# Patient Record
Sex: Female | Born: 1955 | Race: Black or African American | Hispanic: No | Smoking: Current some day smoker
Health system: Southern US, Community
[De-identification: ages and names within clinical notes are randomized; demographics above are authoritative.]

## PROBLEM LIST (undated history)

## (undated) DIAGNOSIS — J45909 Unspecified asthma, uncomplicated: Secondary | ICD-10-CM

## (undated) DIAGNOSIS — R0602 Shortness of breath: Secondary | ICD-10-CM

## (undated) DIAGNOSIS — F419 Anxiety disorder, unspecified: Secondary | ICD-10-CM

## (undated) DIAGNOSIS — J449 Chronic obstructive pulmonary disease, unspecified: Secondary | ICD-10-CM

## (undated) DIAGNOSIS — M1990 Unspecified osteoarthritis, unspecified site: Secondary | ICD-10-CM

## (undated) DIAGNOSIS — G8929 Other chronic pain: Secondary | ICD-10-CM

## (undated) DIAGNOSIS — M549 Dorsalgia, unspecified: Secondary | ICD-10-CM

## (undated) DIAGNOSIS — R079 Chest pain, unspecified: Secondary | ICD-10-CM

## (undated) DIAGNOSIS — I10 Essential (primary) hypertension: Secondary | ICD-10-CM

## (undated) DIAGNOSIS — M5430 Sciatica, unspecified side: Secondary | ICD-10-CM

## (undated) HISTORY — PX: TONSILLECTOMY: SUR1361

## (undated) HISTORY — PX: OTHER SURGICAL HISTORY: SHX169

---

## 1995-05-29 HISTORY — PX: ABDOMINAL HYSTERECTOMY: SHX81

## 2004-04-03 ENCOUNTER — Emergency Department (HOSPITAL_COMMUNITY): Admission: EM | Admit: 2004-04-03 | Discharge: 2004-04-03 | Payer: Self-pay | Admitting: Plastic Surgery

## 2004-05-28 DIAGNOSIS — R079 Chest pain, unspecified: Secondary | ICD-10-CM

## 2004-05-28 HISTORY — DX: Chest pain, unspecified: R07.9

## 2004-05-28 HISTORY — PX: CARDIAC CATHETERIZATION: SHX172

## 2004-08-02 ENCOUNTER — Inpatient Hospital Stay (HOSPITAL_COMMUNITY): Admission: EM | Admit: 2004-08-02 | Discharge: 2004-08-04 | Payer: Self-pay | Admitting: Emergency Medicine

## 2004-08-05 ENCOUNTER — Emergency Department (HOSPITAL_COMMUNITY): Admission: EM | Admit: 2004-08-05 | Discharge: 2004-08-05 | Payer: Self-pay | Admitting: Emergency Medicine

## 2004-11-04 ENCOUNTER — Encounter: Admission: RE | Admit: 2004-11-04 | Discharge: 2004-11-04 | Payer: Self-pay | Admitting: Family Medicine

## 2004-11-28 ENCOUNTER — Ambulatory Visit: Payer: Self-pay | Admitting: Psychiatry

## 2004-11-28 ENCOUNTER — Inpatient Hospital Stay (HOSPITAL_COMMUNITY): Admission: RE | Admit: 2004-11-28 | Discharge: 2004-12-01 | Payer: Self-pay | Admitting: Psychiatry

## 2004-11-28 ENCOUNTER — Emergency Department (HOSPITAL_COMMUNITY): Admission: EM | Admit: 2004-11-28 | Discharge: 2004-11-28 | Payer: Self-pay | Admitting: Emergency Medicine

## 2004-12-06 ENCOUNTER — Other Ambulatory Visit (HOSPITAL_COMMUNITY): Admission: RE | Admit: 2004-12-06 | Discharge: 2004-12-12 | Payer: Self-pay | Admitting: Psychiatry

## 2005-01-31 ENCOUNTER — Emergency Department (HOSPITAL_COMMUNITY): Admission: EM | Admit: 2005-01-31 | Discharge: 2005-01-31 | Payer: Self-pay | Admitting: Emergency Medicine

## 2005-03-04 ENCOUNTER — Emergency Department (HOSPITAL_COMMUNITY): Admission: EM | Admit: 2005-03-04 | Discharge: 2005-03-05 | Payer: Self-pay | Admitting: Emergency Medicine

## 2005-05-07 ENCOUNTER — Ambulatory Visit (HOSPITAL_COMMUNITY): Admission: RE | Admit: 2005-05-07 | Discharge: 2005-05-07 | Payer: Self-pay | Admitting: Family Medicine

## 2005-06-11 ENCOUNTER — Emergency Department (HOSPITAL_COMMUNITY): Admission: EM | Admit: 2005-06-11 | Discharge: 2005-06-12 | Payer: Self-pay | Admitting: Emergency Medicine

## 2005-06-12 ENCOUNTER — Ambulatory Visit: Payer: Self-pay | Admitting: Psychiatry

## 2005-06-12 ENCOUNTER — Inpatient Hospital Stay (HOSPITAL_COMMUNITY): Admission: EM | Admit: 2005-06-12 | Discharge: 2005-06-17 | Payer: Self-pay | Admitting: Psychiatry

## 2005-07-19 ENCOUNTER — Emergency Department (HOSPITAL_COMMUNITY): Admission: EM | Admit: 2005-07-19 | Discharge: 2005-07-19 | Payer: Self-pay | Admitting: Emergency Medicine

## 2006-01-14 ENCOUNTER — Emergency Department (HOSPITAL_COMMUNITY): Admission: EM | Admit: 2006-01-14 | Discharge: 2006-01-14 | Payer: Self-pay | Admitting: Emergency Medicine

## 2006-06-04 ENCOUNTER — Emergency Department (HOSPITAL_COMMUNITY): Admission: EM | Admit: 2006-06-04 | Discharge: 2006-06-04 | Payer: Self-pay | Admitting: Emergency Medicine

## 2006-06-13 ENCOUNTER — Emergency Department (HOSPITAL_COMMUNITY): Admission: EM | Admit: 2006-06-13 | Discharge: 2006-06-13 | Payer: Self-pay | Admitting: Emergency Medicine

## 2006-07-23 ENCOUNTER — Emergency Department (HOSPITAL_COMMUNITY): Admission: EM | Admit: 2006-07-23 | Discharge: 2006-07-23 | Payer: Self-pay | Admitting: Emergency Medicine

## 2006-10-12 ENCOUNTER — Emergency Department (HOSPITAL_COMMUNITY): Admission: EM | Admit: 2006-10-12 | Discharge: 2006-10-12 | Payer: Self-pay | Admitting: Emergency Medicine

## 2006-12-17 ENCOUNTER — Emergency Department (HOSPITAL_COMMUNITY): Admission: EM | Admit: 2006-12-17 | Discharge: 2006-12-17 | Payer: Self-pay | Admitting: Emergency Medicine

## 2007-01-26 ENCOUNTER — Emergency Department (HOSPITAL_COMMUNITY): Admission: EM | Admit: 2007-01-26 | Discharge: 2007-01-26 | Payer: Self-pay | Admitting: Emergency Medicine

## 2007-02-10 ENCOUNTER — Emergency Department (HOSPITAL_COMMUNITY): Admission: EM | Admit: 2007-02-10 | Discharge: 2007-02-10 | Payer: Self-pay | Admitting: Emergency Medicine

## 2007-04-16 ENCOUNTER — Inpatient Hospital Stay (HOSPITAL_COMMUNITY): Admission: AD | Admit: 2007-04-16 | Discharge: 2007-04-18 | Payer: Self-pay | Admitting: *Deleted

## 2007-04-16 ENCOUNTER — Ambulatory Visit: Payer: Self-pay | Admitting: *Deleted

## 2007-04-16 ENCOUNTER — Emergency Department (HOSPITAL_COMMUNITY): Admission: EM | Admit: 2007-04-16 | Discharge: 2007-04-16 | Payer: Self-pay | Admitting: Emergency Medicine

## 2007-05-13 ENCOUNTER — Emergency Department (HOSPITAL_COMMUNITY): Admission: EM | Admit: 2007-05-13 | Discharge: 2007-05-13 | Payer: Self-pay | Admitting: Emergency Medicine

## 2007-07-23 ENCOUNTER — Emergency Department (HOSPITAL_COMMUNITY): Admission: EM | Admit: 2007-07-23 | Discharge: 2007-07-23 | Payer: Self-pay | Admitting: Emergency Medicine

## 2007-11-01 ENCOUNTER — Ambulatory Visit (HOSPITAL_COMMUNITY): Admission: RE | Admit: 2007-11-01 | Discharge: 2007-11-01 | Payer: Self-pay | Admitting: Anesthesiology

## 2008-04-11 ENCOUNTER — Emergency Department (HOSPITAL_COMMUNITY): Admission: EM | Admit: 2008-04-11 | Discharge: 2008-04-11 | Payer: Self-pay | Admitting: Emergency Medicine

## 2008-04-12 ENCOUNTER — Emergency Department (HOSPITAL_COMMUNITY): Admission: EM | Admit: 2008-04-12 | Discharge: 2008-04-12 | Payer: Self-pay | Admitting: Emergency Medicine

## 2008-06-16 ENCOUNTER — Emergency Department (HOSPITAL_COMMUNITY): Admission: EM | Admit: 2008-06-16 | Discharge: 2008-06-16 | Payer: Self-pay | Admitting: Emergency Medicine

## 2008-11-23 ENCOUNTER — Emergency Department (HOSPITAL_COMMUNITY): Admission: EM | Admit: 2008-11-23 | Discharge: 2008-11-23 | Payer: Self-pay | Admitting: Emergency Medicine

## 2009-06-28 ENCOUNTER — Emergency Department (HOSPITAL_COMMUNITY): Admission: EM | Admit: 2009-06-28 | Discharge: 2009-06-28 | Payer: Self-pay | Admitting: Emergency Medicine

## 2009-12-11 ENCOUNTER — Emergency Department (HOSPITAL_COMMUNITY): Admission: EM | Admit: 2009-12-11 | Discharge: 2009-12-12 | Payer: Self-pay | Admitting: Emergency Medicine

## 2009-12-12 ENCOUNTER — Ambulatory Visit: Payer: Self-pay | Admitting: Psychiatry

## 2009-12-12 ENCOUNTER — Inpatient Hospital Stay (HOSPITAL_COMMUNITY): Admission: AD | Admit: 2009-12-12 | Discharge: 2009-12-14 | Payer: Self-pay | Admitting: Psychiatry

## 2009-12-13 ENCOUNTER — Emergency Department (HOSPITAL_COMMUNITY): Admission: EM | Admit: 2009-12-13 | Discharge: 2009-12-13 | Payer: Self-pay | Admitting: Emergency Medicine

## 2009-12-14 DIAGNOSIS — F309 Manic episode, unspecified: Secondary | ICD-10-CM

## 2010-01-23 ENCOUNTER — Emergency Department (HOSPITAL_COMMUNITY): Admission: EM | Admit: 2010-01-23 | Discharge: 2010-01-23 | Payer: Self-pay | Admitting: Emergency Medicine

## 2010-02-18 ENCOUNTER — Emergency Department (HOSPITAL_COMMUNITY): Admission: EM | Admit: 2010-02-18 | Discharge: 2010-02-18 | Payer: Self-pay | Admitting: Emergency Medicine

## 2010-04-04 ENCOUNTER — Ambulatory Visit: Payer: Self-pay | Admitting: Psychiatry

## 2010-05-09 ENCOUNTER — Emergency Department (HOSPITAL_COMMUNITY)
Admission: EM | Admit: 2010-05-09 | Discharge: 2010-05-09 | Payer: Self-pay | Source: Home / Self Care | Admitting: Emergency Medicine

## 2010-05-26 ENCOUNTER — Emergency Department (HOSPITAL_COMMUNITY)
Admission: EM | Admit: 2010-05-26 | Discharge: 2010-05-26 | Payer: Self-pay | Source: Home / Self Care | Admitting: Emergency Medicine

## 2010-06-18 ENCOUNTER — Encounter: Payer: Self-pay | Admitting: Anesthesiology

## 2010-06-18 ENCOUNTER — Encounter: Payer: Self-pay | Admitting: Family Medicine

## 2010-08-12 LAB — RAPID URINE DRUG SCREEN, HOSP PERFORMED
Benzodiazepines: NOT DETECTED
Cocaine: NOT DETECTED
Tetrahydrocannabinol: NOT DETECTED

## 2010-08-12 LAB — URINALYSIS, ROUTINE W REFLEX MICROSCOPIC
Bilirubin Urine: NEGATIVE
Glucose, UA: NEGATIVE mg/dL
Hgb urine dipstick: NEGATIVE
Ketones, ur: NEGATIVE mg/dL
Nitrite: NEGATIVE
Protein, ur: NEGATIVE mg/dL
Specific Gravity, Urine: 1.019 (ref 1.005–1.030)
Urobilinogen, UA: 0.2 mg/dL (ref 0.0–1.0)
pH: 5.5 (ref 5.0–8.0)

## 2010-08-12 LAB — CBC
MCHC: 33.7 g/dL (ref 30.0–36.0)
RDW: 13.3 % (ref 11.5–15.5)

## 2010-08-12 LAB — LIPID PANEL
Cholesterol: 189 mg/dL (ref 0–200)
HDL: 51 mg/dL (ref >39)

## 2010-08-12 LAB — DIFFERENTIAL
Basophils Absolute: 0.1 10*3/uL (ref 0.0–0.1)
Basophils Relative: 1 % (ref 0–1)
Eosinophils Absolute: 0 10*3/uL (ref 0.0–0.7)
Monocytes Absolute: 0.2 10*3/uL (ref 0.1–1.0)
Neutro Abs: 3.4 10*3/uL (ref 1.7–7.7)
Neutrophils Relative %: 55 % (ref 43–77)

## 2010-08-12 LAB — POCT I-STAT, CHEM 8
Calcium, Ion: 1.19 mmol/L (ref 1.12–1.32)
Chloride: 100 mEq/L (ref 96–112)
HCT: 46 % (ref 36.0–46.0)
Hemoglobin: 15.6 g/dL — ABNORMAL HIGH (ref 12.0–15.0)
Potassium: 3.2 mEq/L — ABNORMAL LOW (ref 3.5–5.1)

## 2010-08-12 LAB — ETHANOL: Alcohol, Ethyl (B): 8 mg/dL (ref 0–10)

## 2010-09-11 LAB — POCT I-STAT, CHEM 8
BUN: 6 mg/dL (ref 6–23)
Chloride: 109 mEq/L (ref 96–112)
Creatinine, Ser: 0.7 mg/dL (ref 0.4–1.2)
Potassium: 3.2 mEq/L — ABNORMAL LOW (ref 3.5–5.1)
Sodium: 143 mEq/L (ref 135–145)
TCO2: 25 mmol/L (ref 0–100)

## 2010-10-13 NOTE — H&P (Signed)
NAMECHRISMA, HURLOCK NO.:  [id number]   MEDICAL RECORD NO.:  [id number]          PATIENT TYPE:  IPS   LOCATION:  0406                          FACILITY:  [facility]   PHYSICIAN:  Geoffery Lyons, M.D.      DATE OF BIRTH:  [birth year]   DATE OF ADMISSION:  06/12/2005  DATE OF DISCHARGE:                         PSYCHIATRIC ADMISSION ASSESSMENT   A 55 year old divorced African-American female voluntarily admitted on  June 12, 2005.   HISTORY OF PRESENT ILLNESS:  Patient presents with a history of depression  and suicidal thoughts with voices telling her to stab herself.  Patient  feels very worthless and feels like she is not here.  She is reporting  disturbed sleep, lost about 10 pounds.  Stressors, patient states that she  has no contact with her family.   PAST PSYCHIATRIC HISTORY:  Second admission.  Patient was here in July 2006.  Patient had an overdose of Percocet and sleeping pills.  She sees Dr.  Milford Cage.  Patient had her first visit with her.  She has a history of  multiple overdoses, otherwise.   SOCIAL HISTORY:  This is a 55 year old divorced African-American female who  has three grown children with 11 grandchildren.  She recently moved to  [place] about 1 year ago, currently living alone.  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   FAMILY HISTORY:  Denied.   ALCOHOL AND DRUG HISTORY:  Patient smokes, denies any alcohol or drug use.   PRIMARY CARE Jyasia Markoff:  Dr. Gerri Spore.   MEDICAL PROBLEMS:  Hypertension.  Back pain.   MEDICATIONS:  1.  Enalapril 10/25 daily.  2.  Celebrex 200 mg daily.  3.  Ambien CR 12.5 at bedtime.   DRUG ALLERGIES:  No known allergies.   PHYSICAL EXAMINATION:  Patient was assessed at [facility] Emergency Room.  This is a middle-aged cooperative female in no acute distress.  Temperature  97.5, heart rate 84, respirations 18, blood pressure 125/73, 5 feet 1 inch  tall, 166 pounds.   LABORATORY DATA:  CBC  is within normal limits.  Glucose 104.  Calcium 9.4.  Urine drug screen was negative.  Alcohol level less than 5.   MENTAL STATUS EXAM:  She is a fully alert, cooperative, middle-aged female  with fair eye contact.  Speech is clear with normal rate and tone.  Patient  feels depressed.  Affect is flat.  Thought processes are coherent and  organized.  Endorsing auditory hallucinations.  Does not appear to be  responding to internal stimuli at this time.  Cognitive function intact.  Memory is good.  Judgment and insight are fair.  Poor historian.   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  AXIS II:  Deferred.  AXIS III:  Hypertension.  Back pain.  AXIS IV:  Problems with primary support group, medical problems.  AXIS V:  Current estimated 35.   PLAN:  Stabilize mood and thinking.  Contract for safety.  We will resume  her medications.  Patient will increase coping skills.  We  will consider  family session with patient's support group.  Tentative length of stay 4-5  days.      Landry Corporal, N.P.      Geoffery Lyons, M.D.  Electronically Signed    JO/MEDQ  D:  06/15/2005  T:  06/15/2005  Job:  [id number]

## 2010-10-13 NOTE — Discharge Summary (Signed)
NAMEMIRABELLA, HILARIO                ACCOUNT NO.:  [id number]   MEDICAL RECORD NO.:  [id number]          PATIENT TYPE:  IPS   LOCATION:  0604                          FACILITY:  [facility]   PHYSICIAN:  Geoffery Lyons, M.D.      DATE OF BIRTH:  1956-02-24   DATE OF ADMISSION:  04/16/2007  DATE OF DISCHARGE:  04/18/2007                               DISCHARGE SUMMARY   CHIEF COMPLAINT/HISTORY OF PRESENT ILLNESS:  This was the first  admission to [facility] for this 55 year old female,  feeling depressed, crying, suicidal thoughts, wanting to end it all.  She has not slept in awhile; feeling pretty overwhelmed.  She was  involuntarily committed as she had expressed suicidal thoughts,  threatening to leave before completion of the ED treatment where she was  brought for help.   PSYCHIATRIC HISTORY:  No current outpatient treatment; did see Dr.  Milford Cage  in the past.   ALCOHOL AND DRUG HISTORY:  Denies acute use of use of any substances.   MEDICAL HISTORY:  The hypertension.   MEDICATIONS:  Vasotec 10/25 1 daily, Percocet 1 3 times a day as needed,  Ambien 10 at bedtime for sleep.   PHYSICAL EXAMINATION:  Exam performed and failed to show any acute  findings.   LABORATORY WORK:  Results not available in the chart.   MENTAL STATUS EXAMINATION:  Exam reveals alert, cooperative female, mood  depressed.  Affect depressed.  Thought process logical, coherent and  relevant.  Endorsed feeling very overwhelmed with the way things are  going.  A sense of helpless, helplessness, feeling worthless, suicidal  ruminations although can contract for safety.  No homicidal ideas, no  hallucinations.  Cognition well-preserved.   ADMITTING DIAGNOSES:  AXIS I:  Bipolar disorder depressed.  AXIS II:  No diagnosis.  AXIS III:  History hypertension, back pain.  AXIS IV:  Moderate.  AXIS V:  On admission 35 global assessment of functioning.  Highest  global assessment of functioning in the  last year 60.   COURSE IN THE HOSPITAL:  She was admitted.  She started in individual  and group psychotherapy.  She was given Ambien for sleep.  She was given  Ativan as needed and she was also given some Zyprexa.  As already stated  endorse increased depression, was under a lot of stress taking care of  family members, felt like killing herself; a lot of thoughts running in  her head, feeling worthless, nobody would care or was giving.  Never  says no.  Conflict with the daughter; did not speak to her family in  [place].  She was asked by her sister to leave.  She was kicked  out, was in the streets of  [place] and then in the streets of  [place].  Living by herself, endorsed conflict with the daughter who  claims hates here.  She used to see Dr. Milford Cage in 2006; did not  pursue treatment.  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  She claimed to be uncomfortable in the  unit, felt that there  was some prejudice going on.  They did not like her because of her race.  Did not want to go to group.  By the 23rd she was better.  She was  taking her medications.  She endorsed that she was feeling better.  Endorsed no active suicidal ideas, no hallucinations or delusions, that  she was stable and she denied any active suicidal and homicidal thoughts  and overall seems to be improved.  We went ahead and discharged to  outpatient follow-up.   DISCHARGE DIAGNOSES:  AXIS I:  Bipolar disorder depressed.  AXIS II:  No diagnosis.  AXIS III:  She has hypertension, back pain.  AXIS IV:  Moderate.  AXIS V:  On discharge global assessment of functioning 50.   DISCHARGE MEDICATIONS:  Discharged on Vaseretic 10/25 1 daily, Zyprexa  Zydis 5 at bedtime, Ambien 10 at bedtime as needed for sleep, Ativan 1  mg every 8 hours as needed for anxiety, Percocet 5/335 1 3 times as  needed for pain.   FOLLOW UP:  Follow-up by [facility].      Geoffery Lyons, M.D.   Electronically Signed     IL/MEDQ  D:  05/23/2007  T:  05/24/2007  Job:  [id number]

## 2010-10-13 NOTE — Discharge Summary (Signed)
[name], [name]                ACCOUNT NO.:  [id number]   MEDICAL RECORD NO.:  [id number]          PATIENT TYPE:  IPS   LOCATION:  0406                          FACILITY:  [facility]   PHYSICIAN:  Geoffery Lyons, M.D.      DATE OF BIRTH:  1956-05-16   DATE OF ADMISSION:  06/12/2005  DATE OF DISCHARGE:  06/17/2005                                 DISCHARGE SUMMARY   CHIEF COMPLAINT AND PRESENT ILLNESS:  This was the second admission to [facility] for this 55 year old divorced African-American female  voluntarily admitted.  History of depression, suicidal thoughts with voices  telling her to stab herself.  Feeling very worthless.  Feels like she is  not here.  Reporting disturbed sleep, lost about 10 pounds.  Stressors are,  the patient states, she has no contact with her family.   PAST PSYCHIATRIC HISTORY:  Second time at [facility].  She was  admitted July of 2006.  Had an overdose of Percocet and sleeping pills.  Sees Dr. Milford Cage.  Has history of multiple overdoses.   ALCOHOL/DRUG HISTORY:  Smokes.  Denies any active alcohol or drug use.   MEDICAL HISTORY:  Hypertension, back pain.   MEDICATIONS:  Enalapril 10/25 mg per day, Celebrex 200 mg per day, Ambien CR  12.5 mg per day.   PHYSICAL EXAMINATION:  Performed and failed to show any acute findings.   LABORATORY DATA:  Liver enzymes with SGOT 19, SGPT 15, total bilirubin 0.2,  TSH 1.104.  CBC within normal limits.  Glucose 104.  Urine drug screen  negative for substances of abuse.   MENTAL STATUS EXAM:  Fully alert, cooperative female.  Fair eye contact.  Speech was clear, normal rate and tone.  Feeling depressed.  Affect was  constricted.  Thought processes were logical, coherent and relevant.  Endorsed auditory hallucinations.  Does not appear to be responding to  internal stimuli.  There is some underlying suspiciousness.  Possible  paranoia.  Endorsed suicidal ideation although could contract for  safety.  Endorsed hallucinations.  Cognition was well-preserved.   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  AXIS II:  No diagnosis.  AXIS III:  Hypertension, back pain.  AXIS IV:  Moderate.  AXIS V:  GAF upon admission 35; highest GAF in the last year 60.   HOSPITAL COURSE:  She was admitted.  She was started in individual and group  psychotherapy.  She was given Zyprexa Zydis 5 mg every six hours as needed.  Maintained on the Ambien CR 12.5 mg at night, Percocet 5/500 mg, 1 every six  hours as needed for pain, Ativan 1 mg every six hours as needed for anxiety  and Celebrex 100 mg twice a day.  The enalapril was placed at 10/125 mg per  day.  We continued to work with the Zyprexa and we adjusted the Zyprexa  according to tolerability and response.  There was a sense of hopelessness  and helplessness.  There were suicidal ruminations.  She claimed that she  was  in [place].  She was enticed by her children to come all the way  here to [place] to [place] but, after she came, they have  basically neglected to have a relationship with her.  Endorsed that there is  a lot of conflict between her and her son and her and her daughter.  Said  that she lent some money to her son and, when she asked for the money back,  he was very upset and basically cut off the relationship.  Has been feeling  pretty alone during the holidays.  She said that she was by herself during  Thanksgiving as well as Christmas.  This accentuated her sense of being very  sad, alone, overwhelmed.  Endorsed hallucinations, voices telling her to  hurt herself.  On evaluation, she was shaky.  She was tearful.  We continued  to work with the medication.  There was a lot of rumination, dealing with  the losses, sense of being alone, auditory hallucinations.  As we increased  the Zyprexa, she started sleeping better.  She was very confused as far as  what to  do, if she was going to stay in the area or she was going to go back  to [place].  Needing a lot of reassurance and support.  By June 16, 2005, there was some improvement.  There was some swelling of the ankles and  it was attributed to the Zyprexa, so we stopped the Zyprexa.  We went ahead  and started the Geodon that she seemed to tolerate well.  On June 17, 2005, she endorsed she was better.  She slept all night with the Geodon.  Denied any hallucinations.  She felt like she was stable enough to be able  to be safe, working on her issues on an outpatient basis for what we went  ahead and discharged to outpatient follow-up.  Still a possibility that she  was going to call the family and decide to go back to [place].   DISCHARGE DIAGNOSES:  AXIS I:  Bipolar disorder, depressed, with psychotic  features.  AXIS II:  No diagnosis.  AXIS III:  Hypertension, back pain.  AXIS IV:  Moderate.  AXIS V:  GAF upon discharge 50-55.   DISCHARGE MEDICATIONS:  1.  Ambien CR 12.5 mg at night.  2.  Vasotec 10 mg per day.  3.  Hydrochlorothiazide 25 mg per day.  4.  Geodon 40 mg twice a day with plans to increase up to 80 mg twice a day      as tolerated.  5.  Oxycodone 5/325 mg, 1 every six hours as needed for pain.  6.  Ativan 1 mg twice a day as needed for anxiety.   FOLLOW UP:  Dr. Milford Cage.      Geoffery Lyons, M.D.  Electronically Signed     IL/MEDQ  D:  06/26/2005  T:  06/26/2005  Job:  [id number]

## 2010-10-13 NOTE — Cardiovascular Report (Signed)
[name]                ACCOUNT NO.:  [id number]   MEDICAL RECORD NO.:  [id number]          PATIENT TYPE:  INP   LOCATION:  4731                         FACILITY:  [facility]   PHYSICIAN:  Francisca December, M.D.  DATE OF BIRTH:  Aug 24, 1955   DATE OF PROCEDURE:  08/04/2004  DATE OF DISCHARGE:                              CARDIAC CATHETERIZATION   PROCEDURES PERFORMED:  1.  Left heart catheterization.  2.  Left ventriculogram.  3.  Distal aortogram.  4.  Coronary angiography.   INDICATIONS:  [name] is a 55 year old woman who was admitted with  atypical angina and ruled out for myocardial infarction.  A myocardial  perfusion study with exercise-reproduced nondiagnostic ST segment changes  and a reversible anterior defect was seen on perfusion imaging.  She is  brought to the catheterization laboratory at this time to identify the  extent of disease and provide for further therapeutic options.   PROCEDURAL NOTE:  The patient was brought to the cardiac catheterization  laboratory in the fasting state.  The right groin was prepped and draped in  the usual sterile fashion.  Local anesthesia was obtained with the  infiltration of 1% lidocaine.  A 6 French catheter sheath was inserted  percutaneously into the right femoral artery utilizing an anterior approach  over a guiding J-wire.  A 110 cm pigtail catheter was used to measure  pressures in the ascending aorta and in the left ventricle both prior to and  following the ventriculogram.  A 30-degree RAO cine left ventriculogram was  performed utilizing a power injector.  The pigtail catheter was then  withdrawn into the abdominal aorta, and an AP cine angiogram of the distal  aorta obtained, again utilizing a power injector.  32 cc of contrast was  injected at 15 cc/second.  The pigtail catheter was then exchanged for a 6  French #4 left Judkins catheter.  Cine angiography of the left coronary  artery was conducted in multiple  LAO and RAO projections.  The #4 left  Judkins catheter was then exchanged for a #4 right Judkins catheter.  Cine  angiography of the right coronary artery was conducted in LAO and RAO  projections.  All catheter manipulations were performed using fluoroscopic  observation, and exchange was performed over a long guiding J-wire.  At the  completion of the procedure, the catheter and catheter sheath were removed.  Hemostasis was achieved by direct pressure.  The patient was transported to  the recovery area in stable condition, with an intact distal pulse.   HEMODYNAMICS:  Systemic arterial pressure was 168/85, with a mean of 114  mmHg.  There was no systolic gradient across the aortic valve.  The left  ventricular end-diastolic pressure was 21 mmHg preventriculogram and 24 mmHg  postventriculogram.   ANGIOGRAPHY:  The left ventriculogram was suboptimal, with excessive  ventricular tachycardia induced by the injection.  Overall global systolic  function, though, is normal.  The final post PVC beat shows no regional wall  motion abnormalities.  The aortic valve was trileaflet and opens normally  during systole.  There  is no mitral regurgitation.   The distal aortogram demonstrates widely patent renal arteries bilaterally  and no significant atherosclerotic disease.   There is a right dominant coronary system present.  The main left coronary  artery was normal.   The left anterior descending and its branches are normal.  Three diagonal  branches arise, the first of which is moderate in size, the second and third  of which are relatively small.  The ongoing anterior descending reaches and  traverses the apex.  I do not see any evidence of atherosclerotic disease in  the anterior descending artery.   The left circumflex coronary artery and its branches appear normal.  There  is a large first marginal branch and then a small second marginal branch.  The third marginal branch is large and  is on the obtuse margin of the heart.  The ongoing circumflex is very tiny in the AV groove.  Again, no  obstructions or luminal irregularities are seen in the left circumflex  system.   The right coronary artery and its branches are normal.  The vessel gives  rise to a very small posterior descending artery, a small posterolateral  segment, and two small left ventricular branches.  Proximal, mid, and distal  portions of the artery are without any evidence of atherosclerotic disease.  There is a large right ventricular branch that moves inferiorly and apically  providing distal septal perforators.   Collateral vessels are not seen.   FINAL IMPRESSION:  1.  Intact left ventricular size and global systolic function.  Ejection      fraction estimated at 65%.  2.  Elevated left ventricular end-diastolic pressure.  3.  Systemic hypertension.  4.  No evidence of renal artery stenosis.  5.  Noncardiac chest pain/normal coronary arteries.   PLAN/RECOMMENDATIONS:  This excellent news was presented to the patient.  She needs aggressive hypertensive treatment, of course.  Other evaluation to  be undertaken by the hospitalist service as they feel appropriate.      JHE/MEDQ  D:  08/04/2004  T:  08/04/2004  Job:  [id number]   cc:   Otilio Connors. Gerri Spore, M.D.  [address]  [place]  [place] [address]  Fax: [phone]

## 2010-10-13 NOTE — Discharge Summary (Signed)
NAME:  [name], [name]                ACCOUNT NO.:  [id number]   MEDICAL RECORD NO.:  [id number]          PATIENT TYPE:  INP   LOCATION:  4731                         FACILITY:  [facility]   PHYSICIAN:  Corinna L. Lendell Caprice, MDDATE OF BIRTH:  [birth year]   DATE OF ADMISSION:  08/02/2004  DATE OF DISCHARGE:  08/04/2004                                 DISCHARGE SUMMARY   DIAGNOSES:  1.  Symptom complex of headache, chest pain, weakness, diarrhea and vomiting      most likely viral in origin, negative catheterization.  2.  False positive Cardiolite.  3.  Hypertension.  4.  Tobacco abuse, counseled against.  5.  Hyperlipidemia: Will need follow-up as an outpatient  6.  Chronic back pain.   DISCHARGE MEDICATIONS:  1.  Aspirin 81 mg a day.  2.  Vaseretic as previous.  3.  Fioricet 1-2 p.o. q.4h. p.r.n. headache 10 were dispensed.   CONDITION ON DISCHARGE:  Condition stable.   FOLLOW UP:  With Dr. Queen Arnold an as needed and certainly within three to six  months.  Follow-up her fasting lipid panel.   CONSULTATIONS:  Meade Maw, M.D.   PROCEDURE:  Cardiac catheterization which showed normal coronaries with no  renal artery stenosis.   DIET:  Should be low salt, low cholesterol.   ACTIVITY:  She is encouraged to increase her exercise regimen.   PERTINENT LABORATORY DATA:  CBC unremarkable. BMET unremarkable. Liver  function tests unremarkable. Total cholesterol was 189, LDL was 123,  triglycerides 94, HDL 47. Cardiac enzymes negative x 3. D-dimer less than  0.22.   Special studies in radiology:  EKG showed sinus bradycardia at 50 beats a  minute. Stress Cardiolite showed ejection fraction of 66%, an anterior wall  ischemia which is a false positive as the catheterization was negative.  Chest x-ray no infiltrate or CHF.   HISTORY AND HOSPITAL COURSE:  [name] is a 55 year old black female, with  a history of smoking, hypertension and strong family history of heart  disease, who  presented to the emergency room after waking up with a terrible  headache. She also had weakness, chest pain. She vomited after getting  aspirin. She also had an episode of diarrhea. She had shortness of breath  with this. She was admitted to telemetry where she ruled out for myocardial  infarction. Cardiology was consulted and they performed a Cardiolite which  was positive for anterior ischemia; however, catheterization is negative and  she shows a preserved ejection fraction. She received smoking cessation  counseling while here and plans to quit smoking. Her chest pain resolved but  she continued to have a headache as well as diarrhea. She denies any sinus  symptoms. I suspect she may be suffering from a viral illness but that she  has return of her chest pain and a headache that is not resolve this may  need workup further. She did, however, have a CAT scan of the brain which  was negative.  this concludes my dictation thanks      CLS/MEDQ  D:  08/04/2004  T:  08/05/2004  Job:  [id number]   cc:   [name], M.D.  [address]  [place]  [place] [address]  Fax: [phone]

## 2010-10-13 NOTE — H&P (Signed)
NAME:  [name], [name]                ACCOUNT NO.:  [id number]   MEDICAL RECORD NO.:  [id number]          PATIENT TYPE:  INP   LOCATION:  4731                         FACILITY:  [facility]   PHYSICIAN:  Corinna L. Lendell Caprice, MDDATE OF BIRTH:  March 15, 1956   DATE OF ADMISSION:  08/02/2004  DATE OF DISCHARGE:                                HISTORY & PHYSICAL   CHIEF COMPLAINT:  Headache, chest pain, and weakness.   HISTORY OF PRESENT ILLNESS:  Ms. [name] is a 55 year old black female with a  history of hypertension, tobacco abuse, and strong family history of heart  disease, who presents to the emergency room after waking up with a severe  headache and weakness, which was accompanied by substernal chest pressure.  She also felt short of breath with this episode.  Her headache is slightly  better.  She has received aspirin in the emergency room, but vomited soon  thereafter.  The chest pressure remains.  It is not pleuritic.  She had a  stress test in the mid 90s, which was reportedly normal.  This was done in  [place].  She recently moved from [place] and saw Dr. Merri Arnold for the first time yesterday.   PAST MEDICAL HISTORY:  1.  Hypertension.  2.  Chronic back pain.  3.  Chronic leg pain.   MEDICATIONS:  1.  Percocet as needed for back pain.  2.  Vaseretic, dose unknown.   SOCIAL HISTORY:  The patient recently moved from [place].  She smokes  about a pack of cigarettes a day.  She does not drink or use drugs.   FAMILY HISTORY:  Her mother died of a heart attack in her 51s.  Her sister  has heart problems.  A sibling has had lung cancer and breast cancer.   REVIEW OF SYSTEMS:  GENERAL:  She feels weak.  No weight loss, no fevers.  HEENT:  The headache is holocephalic, not accompanied by diplopia or  photophobia.  No neck stiffness.  RESPIRATORY:  No cough, no wheezing.  Shortness of breath as above.  Cardiovascular as above.  GI:  She is hungry  now, and is no  longer nauseated.  She had no diarrhea.  GU:  No dysuria.  MUSCULOSKELETAL:  As above.  SKIN:  No rash.  PSYCHIATRIC:  No depression.  NEUROLOGIC:  No history of stroke or seizure.  ENDOCRINE:  No diabetes.  HEMATOLOGIC:  No history of thromboembolism.   LABORATORY DATA:  CBC is unremarkable.  A D-dimer was less than 0.22.  Comprehensive metabolic panel unremarkable.  Two sets of point of care  enzymes are normal.  Chest x-ray shows no infiltrate.  Peribronchial  thickening.  CT of the head was negative.  EKG shows sinus bradycardia with  a rate of 51.  Her telemetry strip reads a rate of 70 now.   ASSESSMENT AND PLAN:  1.  Chest pain.  This is somewhat atypical, but the patient has several risk      factors for coronary disease.  She received a nitroglycerin sublingually      by the  nurse, which made her headache worse, and she did not want to try      another.  It had no effect on her chest pain.  I will admit her to      telemetry, get cardiology involved for a Cardiolite, continue an aspirin      a day, oxygen.  Get serial enzymes.  I will also try Toradol and a GI      cocktail.  I did not mention this on the physical exam, but when      palpated, she does have some reproducibility of the chest wall      tenderness.  2.  Tobacco abuse.  Counseled against.  3.  Cephalgia.  She will get Percocet or Tylenol as needed.  4.  Vomiting.  She is currently hungry and not nauseated, but I will order      p.r.n. Phenergan.  5.  Dyspnea.  Her chest x-ray and D-dimer are negative.  This may be related      to her heart.  6.  Hypertension.  Will continue ACE inhibitor, but hold the thiazide      portion of her Vaseretic.  7.  Chronic back pain.  8.  Chronic leg pain.      CLS/MEDQ  D:  08/02/2004  T:  08/02/2004  Job:  [id number]   cc:   Dario Guardian, M.D.  [address]  [place]  [place] [address]  Fax: [phone]

## 2010-10-13 NOTE — Discharge Summary (Signed)
[name], [name]                ACCOUNT NO.:  [id number]   MEDICAL RECORD NO.:  [id number]          PATIENT TYPE:  IPS   LOCATION:  0500                          [facility]   PHYSICIAN:  Anselm Jungling, MD  DATE OF BIRTH:  24-Nov-1955   DATE OF ADMISSION:  11/28/2004  DATE OF DISCHARGE:  12/01/2004                                 DISCHARGE SUMMARY   IDENTIFYING DATA:  The patient was admitted through the emergency  department, with thoughts of cutting herself, stabbing herself or overdosing  to end her life.  She had been having auditory hallucinations commanding  herself to hurt herself or others.   The patient's recent history had included decreased concentration, sleep and  appetite over the previous two weeks.  Her last previous course of inpatient  treatment had been in 2004 in [place].  The patient was unable to  remember what medications had been used at that time.  At the time she came  to [place], she was on no psychotropic medications and she denied substance abuse  or alcohol abuse.   ADMISSION DIAGNOSES:  AXIS I:  Major depressive disorder, recurrent, with  psychotic features.  AXIS II:  Deferred.  AXIS III:  History of hypertension and back pain.  AXIS IV:  Stressors:  Severe.  AXIS V:  GAF 30; highest in past year 70.   LABORATORY AND MEDICAL:  The patient had no significant medical issues  during this brief inpatient stay.  Her admission laboratory included a  cholesterol of 225, and triglyceride level of 110.   HOSPITAL COURSE:  The patient was admitted to the adult inpatient  psychiatric service.  She was started on a dose of 0.25 mg of Risperdal  daily.  This was well-tolerated and quickly increased to 1 mg of the M-Tab  form of Risperdal.  This was well-tolerated also.  She was also initiated on  Zoloft 25 mg p.o. q.d. to address depression.  Trazodone was used for  insomnia on a p.r.n. basis.  This was successful as well.   The patient was a good  participant in the inpatient program.  She attended  various therapeutic groups, activities and classes designed to help her  acquire better coping skills and better understanding of her underlying  disorders and dynamics and the development of safety and crisis planning.  Family issues were looked at as well.   CONDITION ON DISCHARGE:  At the time of discharge, the patient was absent  auditory hallucinations and absent any thoughts of harming herself or ending  her life.  She appeared to be responding to medication reasonably well,  although it was early on in the trials of these medications.  She was fairly  optimistic.   AFTERCARE:  The patient was discharged to the intensive outpatient  psychiatric program, which was also to encompass ongoing medication  management.  In addition, arrangements were made to explore the possibility  [facility]   DISCHARGE DIAGNOSES:  AXIS I:  Major depressive disorder, recurrent, with  psychotic features, resolving.  AXIS II:  Deferred.  AXIS III:  History of hypertension and back pain.  AXIS IV:  Stressors:  Severe.  AXIS V:  GAF on discharge 50.   DISCHARGE MEDICATIONS:  1.  Risperdal 1 mg p.o. q.d.  2.  Zoloft 25 mg p.o. q.d.  3.  Vasotec 10/25 mg q.d.     _______________    SPB/MEDQ  D:  12/25/2004  T:  12/26/2004  Job:  [id number]
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
## 2010-12-06 ENCOUNTER — Emergency Department (HOSPITAL_COMMUNITY)
Admission: EM | Admit: 2010-12-06 | Discharge: 2010-12-06 | Disposition: A | Discharge status: Home / Self Care | Payer: BC Managed Care – PPO | Attending: Emergency Medicine | Admitting: Emergency Medicine

## 2010-12-06 DIAGNOSIS — M545 Low back pain, unspecified: Secondary | ICD-10-CM | POA: Insufficient documentation

## 2010-12-06 DIAGNOSIS — F3289 Other specified depressive episodes: Secondary | ICD-10-CM | POA: Insufficient documentation

## 2010-12-06 DIAGNOSIS — I10 Essential (primary) hypertension: Secondary | ICD-10-CM | POA: Insufficient documentation

## 2010-12-06 DIAGNOSIS — F329 Major depressive disorder, single episode, unspecified: Secondary | ICD-10-CM | POA: Insufficient documentation

## 2010-12-06 DIAGNOSIS — F411 Generalized anxiety disorder: Secondary | ICD-10-CM | POA: Insufficient documentation

## 2010-12-06 DIAGNOSIS — Z79899 Other long term (current) drug therapy: Secondary | ICD-10-CM | POA: Insufficient documentation

## 2010-12-06 DIAGNOSIS — G8929 Other chronic pain: Secondary | ICD-10-CM | POA: Insufficient documentation

## 2010-12-06 LAB — URINALYSIS, ROUTINE W REFLEX MICROSCOPIC
Glucose, UA: NEGATIVE mg/dL
Hgb urine dipstick: NEGATIVE
Leukocytes, UA: NEGATIVE
Specific Gravity, Urine: 1.016 (ref 1.005–1.030)
pH: 7 (ref 5.0–8.0)

## 2011-01-03 ENCOUNTER — Other Ambulatory Visit (HOSPITAL_COMMUNITY): Payer: Self-pay | Admitting: Family Medicine

## 2011-01-03 DIAGNOSIS — Z1231 Encounter for screening mammogram for malignant neoplasm of breast: Secondary | ICD-10-CM

## 2011-01-05 ENCOUNTER — Emergency Department (HOSPITAL_COMMUNITY): Payer: BC Managed Care – PPO

## 2011-01-05 ENCOUNTER — Observation Stay (HOSPITAL_COMMUNITY)
Admission: EM | Admit: 2011-01-05 | Discharge: 2011-01-05 | Disposition: A | Discharge status: Home / Self Care | Payer: BC Managed Care – PPO | Attending: Internal Medicine | Admitting: Internal Medicine

## 2011-01-05 DIAGNOSIS — R0789 Other chest pain: Principal | ICD-10-CM | POA: Insufficient documentation

## 2011-01-05 DIAGNOSIS — I10 Essential (primary) hypertension: Secondary | ICD-10-CM | POA: Insufficient documentation

## 2011-01-05 DIAGNOSIS — G8929 Other chronic pain: Secondary | ICD-10-CM | POA: Insufficient documentation

## 2011-01-05 DIAGNOSIS — F17200 Nicotine dependence, unspecified, uncomplicated: Secondary | ICD-10-CM | POA: Insufficient documentation

## 2011-01-05 DIAGNOSIS — M549 Dorsalgia, unspecified: Secondary | ICD-10-CM | POA: Insufficient documentation

## 2011-01-05 LAB — CK TOTAL AND CKMB (NOT AT ARMC)
CK, MB: 1.6 ng/mL (ref 0.3–4.0)
Relative Index: 1.8 (ref 0.0–2.5)
Relative Index: 1.2 (ref 0.0–2.5)

## 2011-01-05 LAB — CBC
MCH: 29.3 pg (ref 26.0–34.0)
MCV: 88.9 fL (ref 78.0–100.0)
Platelets: 343 10*3/uL (ref 150–400)
RBC: 4.33 MIL/uL (ref 3.87–5.11)
RDW: 13.8 % (ref 11.5–15.5)

## 2011-01-05 LAB — DIFFERENTIAL
Eosinophils Absolute: 0.1 10*3/uL (ref 0.0–0.7)
Eosinophils Relative: 1 % (ref 0–5)
Lymphs Abs: 2.7 10*3/uL (ref 0.7–4.0)
Monocytes Absolute: 0.5 10*3/uL (ref 0.1–1.0)
Monocytes Relative: 7 % (ref 3–12)

## 2011-01-05 LAB — RAPID URINE DRUG SCREEN, HOSP PERFORMED
Amphetamines: NOT DETECTED
Tetrahydrocannabinol: NOT DETECTED

## 2011-01-05 LAB — POCT I-STAT TROPONIN I
Troponin i, poc: 0 ng/mL (ref 0.00–0.08)
Troponin i, poc: 0 ng/mL (ref 0.00–0.08)

## 2011-01-05 LAB — BASIC METABOLIC PANEL
CO2: 32 mEq/L (ref 19–32)
Calcium: 9.4 mg/dL (ref 8.4–10.5)
Creatinine, Ser: 0.71 mg/dL (ref 0.50–1.10)

## 2011-01-05 LAB — URINALYSIS, ROUTINE W REFLEX MICROSCOPIC
Glucose, UA: NEGATIVE mg/dL
Ketones, ur: NEGATIVE mg/dL
Leukocytes, UA: NEGATIVE
Protein, ur: NEGATIVE mg/dL
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
## 2011-01-09 NOTE — Discharge Summary (Signed)
  [name], [name]                ACCOUNT NO.:  [id number]  MEDICAL RECORD NO.:  [id number]  LOCATION:  2030                         FACILITY:  [facility]  PHYSICIAN:  Lonia Blood, M.D.       DATE OF BIRTH:  February 13, 1956  DATE OF ADMISSION:  01/05/2011 DATE OF DISCHARGE:  01/05/2011                              DISCHARGE SUMMARY   PRIMARY CARE PHYSICIAN:  Carola J. Gerri Spore, MD, [facility]  DISCHARGE DIAGNOSES: 1. Chest pain, noncardiac in nature, atypical ? gastroesophageal     reflux disease versus musculoskeletal. 2. Tobacco abuse. 3. Hypertension. 4. Chronic back pain. 5. Status post hysterectomy.  DISCHARGE MEDICATIONS: 1. GI cocktail 10 mL by mouth every 6 hours as needed for abdominal     pain. 2. Nicotine patch 21 mg daily. 3. Enalapril/hydrochlorothiazide 10/25 daily. 4. Ambien 10 mg by mouth at bedtime as needed for sleep. 5. Oxycodone/Tylenol 7.5/325 one every 6 hours as needed for pain. 6. Methocarbamol 750 mg every 8 hours. 7. Omeprazole 20 mg daily.  CONDITION ON DISCHARGE:  [name] was discharged in good condition. She will follow up with her primary care physician to assure resolution of her chest discomfort.  CONSULTATION THIS ADMISSION:  The patient was seen in consultation by Dr. Donato Schultz.  PROCEDURES DURING THIS ADMISSION:  The patient underwent a stress echocardiogram, January 05, 2011 by Dr. Donato Schultz without any evidence of inducible ischemia.  HISTORY AND PHYSICAL:  Refer dictated H and P done by Dr. Lavera Guise.  HOSPITAL COURSE:  [name] is a 55 year old with tobacco abuse and family history of coronary artery disease presented with complaints of some atypical chest pain.  She was placed on observation at 3 sets of cardiac enzymes which were all within normal limits not suggestive of myocardial injury.  She had a D-dimer which was normal.  She underwent a echocardiogram stress test with dobutamine which did not show any inducible  ischemia. She was getting discharged home with symptomatic treatment for musculoskeletal chest pain with Percocet and for esophageal reflux disease with omeprazole and GI cocktail.  She will follow up with her primary care physician to assure resolution of her symptoms.     Lonia Blood, M.D.     SL/MEDQ  D:  01/05/2011  T:  01/06/2011  Job:  [id number]  Electronically Signed by Lonia Blood M.D. on 01/09/2011 05:46:10 PM
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
## 2011-01-12 ENCOUNTER — Ambulatory Visit (HOSPITAL_COMMUNITY)
Admission: RE | Admit: 2011-01-12 | Discharge: 2011-01-12 | Disposition: A | Discharge status: Home / Self Care | Payer: BC Managed Care – PPO | Source: Ambulatory Visit | Attending: Family Medicine | Admitting: Family Medicine

## 2011-01-12 DIAGNOSIS — Z1231 Encounter for screening mammogram for malignant neoplasm of breast: Secondary | ICD-10-CM | POA: Insufficient documentation

## 2011-01-13 ENCOUNTER — Telehealth: Payer: Self-pay | Admitting: Gastroenterology

## 2011-01-13 NOTE — Telephone Encounter (Signed)
No bowel movement since colonoscopy 4 days ago.  Denies abdominal pain.  Advised to take miralax 1-2 times a day until she has a bm.

## 2011-01-18 ENCOUNTER — Other Ambulatory Visit: Payer: Self-pay | Admitting: Family Medicine

## 2011-01-18 DIAGNOSIS — R928 Other abnormal and inconclusive findings on diagnostic imaging of breast: Secondary | ICD-10-CM

## 2011-01-24 ENCOUNTER — Ambulatory Visit
Admission: RE | Admit: 2011-01-24 | Discharge: 2011-01-24 | Disposition: A | Discharge status: Home / Self Care | Payer: BC Managed Care – PPO | Source: Ambulatory Visit | Attending: Family Medicine | Admitting: Family Medicine

## 2011-01-24 ENCOUNTER — Other Ambulatory Visit: Payer: Self-pay | Admitting: Family Medicine

## 2011-01-24 DIAGNOSIS — R928 Other abnormal and inconclusive findings on diagnostic imaging of breast: Secondary | ICD-10-CM

## 2011-01-31 ENCOUNTER — Ambulatory Visit
Admission: RE | Admit: 2011-01-31 | Discharge: 2011-01-31 | Disposition: A | Discharge status: Home / Self Care | Payer: BC Managed Care – PPO | Source: Ambulatory Visit | Attending: Family Medicine | Admitting: Family Medicine

## 2011-01-31 ENCOUNTER — Other Ambulatory Visit: Payer: Self-pay | Admitting: Family Medicine

## 2011-01-31 DIAGNOSIS — R928 Other abnormal and inconclusive findings on diagnostic imaging of breast: Secondary | ICD-10-CM

## 2011-02-27 LAB — WOUND CULTURE

## 2011-03-06 LAB — RAPID URINE DRUG SCREEN, HOSP PERFORMED
Opiates: NOT DETECTED
Tetrahydrocannabinol: NOT DETECTED

## 2011-03-06 LAB — BASIC METABOLIC PANEL
GFR calc Af Amer: >60
GFR calc non Af Amer: >60
Glucose, Bld: 95
Potassium: 3.5
Sodium: 140

## 2011-03-09 LAB — DIFFERENTIAL
Eosinophils Relative: 2
Lymphocytes Relative: 45
Lymphs Abs: 3.1
Monocytes Absolute: 0.4
Monocytes Relative: 5

## 2011-03-09 LAB — URINALYSIS, ROUTINE W REFLEX MICROSCOPIC
Glucose, UA: NEGATIVE
Hgb urine dipstick: NEGATIVE
Ketones, ur: NEGATIVE
pH: 5.5

## 2011-03-09 LAB — CBC
HCT: 38.4
Hemoglobin: 12.9
RBC: 4.34
RDW: 13.9
WBC: 7

## 2011-03-09 LAB — I-STAT 8, (EC8 V) (CONVERTED LAB)
Acid-Base Excess: 6 — ABNORMAL HIGH
BUN: 16
Bicarbonate: 30.6 — ABNORMAL HIGH
Chloride: 104
HCT: 43
Hemoglobin: 14.6
Operator id: 285491
Sodium: 141

## 2011-03-09 LAB — POCT I-STAT CREATININE: Creatinine, Ser: 0.8

## 2011-03-31 ENCOUNTER — Other Ambulatory Visit: Payer: Self-pay

## 2011-03-31 ENCOUNTER — Emergency Department (HOSPITAL_COMMUNITY)
Admission: EM | Admit: 2011-03-31 | Discharge: 2011-04-01 | Disposition: A | Discharge status: Home / Self Care | Payer: BC Managed Care – PPO | Attending: Emergency Medicine | Admitting: Emergency Medicine

## 2011-03-31 ENCOUNTER — Emergency Department (HOSPITAL_COMMUNITY): Payer: BC Managed Care – PPO

## 2011-03-31 DIAGNOSIS — Z79899 Other long term (current) drug therapy: Secondary | ICD-10-CM | POA: Insufficient documentation

## 2011-03-31 DIAGNOSIS — I10 Essential (primary) hypertension: Secondary | ICD-10-CM | POA: Insufficient documentation

## 2011-03-31 DIAGNOSIS — F341 Dysthymic disorder: Secondary | ICD-10-CM | POA: Insufficient documentation

## 2011-03-31 DIAGNOSIS — E876 Hypokalemia: Secondary | ICD-10-CM | POA: Insufficient documentation

## 2011-03-31 DIAGNOSIS — R079 Chest pain, unspecified: Secondary | ICD-10-CM | POA: Insufficient documentation

## 2011-03-31 DIAGNOSIS — R0602 Shortness of breath: Secondary | ICD-10-CM | POA: Insufficient documentation

## 2011-03-31 DIAGNOSIS — M25519 Pain in unspecified shoulder: Secondary | ICD-10-CM | POA: Insufficient documentation

## 2011-03-31 LAB — BASIC METABOLIC PANEL
BUN: 15 mg/dL (ref 6–23)
CO2: 32 mEq/L (ref 19–32)
Calcium: 9.4 mg/dL (ref 8.4–10.5)
Chloride: 102 mEq/L (ref 96–112)
Creatinine, Ser: 0.83 mg/dL (ref 0.50–1.10)
GFR calc Af Amer: 89 mL/min — ABNORMAL LOW (ref >90)
GFR calc non Af Amer: 77 mL/min — ABNORMAL LOW (ref >90)
Glucose, Bld: 114 mg/dL — ABNORMAL HIGH (ref 70–99)
Potassium: 2.6 mEq/L — CL (ref 3.5–5.1)
Sodium: 141 mEq/L (ref 135–145)

## 2011-03-31 LAB — DIFFERENTIAL
Basophils Absolute: 0 10*3/uL (ref 0.0–0.1)
Basophils Relative: 0 % (ref 0–1)
Eosinophils Absolute: 0.1 10*3/uL (ref 0.0–0.7)
Eosinophils Relative: 1 % (ref 0–5)
Lymphocytes Relative: 50 % — ABNORMAL HIGH (ref 12–46)
Lymphs Abs: 3.3 10*3/uL (ref 0.7–4.0)
Monocytes Absolute: 0.4 10*3/uL (ref 0.1–1.0)
Monocytes Relative: 6 % (ref 3–12)
Neutro Abs: 2.8 10*3/uL (ref 1.7–7.7)
Neutrophils Relative %: 43 % (ref 43–77)

## 2011-03-31 LAB — CBC
HCT: 36.1 % (ref 36.0–46.0)
Hemoglobin: 11.7 g/dL — ABNORMAL LOW (ref 12.0–15.0)
MCH: 29.1 pg (ref 26.0–34.0)
MCHC: 32.4 g/dL (ref 30.0–36.0)
MCV: 89.8 fL (ref 78.0–100.0)
Platelets: 280 10*3/uL (ref 150–400)
RBC: 4.02 MIL/uL (ref 3.87–5.11)
RDW: 13.7 % (ref 11.5–15.5)
WBC: 6.5 10*3/uL (ref 4.0–10.5)

## 2011-03-31 LAB — POCT I-STAT TROPONIN I: Troponin i, poc: 0 ng/mL (ref 0.00–0.08)

## 2011-05-29 ENCOUNTER — Other Ambulatory Visit: Payer: Self-pay

## 2011-05-29 ENCOUNTER — Emergency Department (HOSPITAL_COMMUNITY): Payer: BC Managed Care – PPO

## 2011-05-29 ENCOUNTER — Encounter: Payer: Self-pay | Admitting: Emergency Medicine

## 2011-05-29 ENCOUNTER — Emergency Department (HOSPITAL_COMMUNITY)
Admission: EM | Admit: 2011-05-29 | Discharge: 2011-05-29 | Disposition: A | Discharge status: Home / Self Care | Payer: BC Managed Care – PPO | Attending: Emergency Medicine | Admitting: Emergency Medicine

## 2011-05-29 DIAGNOSIS — R61 Generalized hyperhidrosis: Secondary | ICD-10-CM | POA: Insufficient documentation

## 2011-05-29 DIAGNOSIS — Z79899 Other long term (current) drug therapy: Secondary | ICD-10-CM | POA: Insufficient documentation

## 2011-05-29 DIAGNOSIS — I10 Essential (primary) hypertension: Secondary | ICD-10-CM | POA: Insufficient documentation

## 2011-05-29 DIAGNOSIS — R079 Chest pain, unspecified: Secondary | ICD-10-CM | POA: Insufficient documentation

## 2011-05-29 DIAGNOSIS — M545 Low back pain, unspecified: Secondary | ICD-10-CM | POA: Insufficient documentation

## 2011-05-29 DIAGNOSIS — R0602 Shortness of breath: Secondary | ICD-10-CM | POA: Insufficient documentation

## 2011-05-29 DIAGNOSIS — R110 Nausea: Secondary | ICD-10-CM | POA: Insufficient documentation

## 2011-05-29 DIAGNOSIS — G8929 Other chronic pain: Secondary | ICD-10-CM | POA: Insufficient documentation

## 2011-05-29 DIAGNOSIS — F17200 Nicotine dependence, unspecified, uncomplicated: Secondary | ICD-10-CM | POA: Insufficient documentation

## 2011-05-29 DIAGNOSIS — M79609 Pain in unspecified limb: Secondary | ICD-10-CM | POA: Insufficient documentation

## 2011-05-29 HISTORY — DX: Essential (primary) hypertension: I10

## 2011-05-29 LAB — BASIC METABOLIC PANEL
BUN: 10 mg/dL (ref 6–23)
GFR calc Af Amer: >90 mL/min (ref >90)
GFR calc non Af Amer: >90 mL/min (ref >90)
Potassium: 3.3 mEq/L — ABNORMAL LOW (ref 3.5–5.1)
Sodium: 141 mEq/L (ref 135–145)

## 2011-05-29 LAB — DIFFERENTIAL
Basophils Absolute: 0 10*3/uL (ref 0.0–0.1)
Basophils Relative: 0 % (ref 0–1)
Eosinophils Absolute: 0 10*3/uL (ref 0.0–0.7)
Neutrophils Relative %: 51 % (ref 43–77)

## 2011-05-29 LAB — CBC
MCH: 29.4 pg (ref 26.0–34.0)
MCHC: 32.7 g/dL (ref 30.0–36.0)
Platelets: 326 10*3/uL (ref 150–400)

## 2011-05-29 LAB — POCT I-STAT TROPONIN I: Troponin i, poc: 0 ng/mL (ref 0.00–0.08)

## 2011-05-29 MED ORDER — ASPIRIN 81 MG PO CHEW
324.0000 mg | CHEWABLE_TABLET | Freq: Once | ORAL | Status: AC
  Administered 2011-05-29: 324 mg via ORAL
  Filled 2011-05-29: qty 4

## 2011-05-29 MED ORDER — OXYCODONE-ACETAMINOPHEN 5-325 MG PO TABS
2.0000 | ORAL_TABLET | ORAL | Status: AC | PRN
Start: 2011-05-29 — End: 2011-06-08

## 2011-05-29 MED ORDER — HYDROMORPHONE HCL PF 1 MG/ML IJ SOLN
1.0000 mg | Freq: Once | INTRAMUSCULAR | Status: AC
  Administered 2011-05-29: 1 mg via INTRAVENOUS
  Filled 2011-05-29: qty 1

## 2011-05-29 NOTE — ED Notes (Signed)
Pt c/o lower back pain with radiation down left leg x 3 days with mid sternal CP starting today; pt sts pain worse with palpation

## 2011-05-29 NOTE — ED Notes (Signed)
Pt states her back pain is a 7/10, improved since pain meds administered. She states her chest pressure is non-radiating, 5/10, constant. Pt placed on 2 l p/m via [place]. Pt asks for meal, extra pillow, and extra blanket. Pt accomodated. Monitor reveals NSR 58 without ectopy, v/s WNL. Will continue to monitor.

## 2011-05-29 NOTE — ED Provider Notes (Signed)
History     CSN: [id number]  Arrival date & time 05/29/11  1609   First MD Initiated Contact with Patient 05/29/11 1753      Chief Complaint  Patient presents with  . Back Pain  . Chest Pain    (Consider location/radiation/quality/duration/timing/severity/associated sxs/prior treatment) HPI History provided by pt.   Pt has chronic, diffuse low back pain w/ radiation down bilateral posterior legs.  Pain has gradually worsened over past 4-5 years but today she is not able to tolerate it.  Pain aggravated by ambulation and associated w/ occasional buckling of legs.  This is also chronic.  Denies fever, bladder/bowel dysfunction and lower extremity parasthesias.   No recent trauma.  Pain seems to get worse when it is cold outside. Per prior chart, pt had an MRI in 10/2007 that showed left foraminal and extra foraminal disc protrusion at L3-4 and apparent left L3 nerve root displacement.  Pt also c/o chronic, intermittent, non-radiating, substernal chest tightness.  Occasionally associated w/ nausea, diaphoresis and/or SOB.  No aggravating/alleviating factors.  No recent change in symptoms and denies cough and trauma.  Pain seems to worsen when her back pain worsens. Pt smokes cigarettes and strong FH MI.  Per prior chart, pt had a clean cardiac cath in 2006.  Was admitted for CP in 12/2010, was consulted by Dr. Anne Fu and had an exercise stress echo that did not induce ischemia.  Was seen for CP again last month and discharged home after negative work-up.  Pt reports that she has not followed up with cardiology because "she is too scared".        Past Medical History  Diagnosis Date  . Hypertension     History reviewed. No pertinent past surgical history.  History reviewed. No pertinent family history.  History  Substance Use Topics  . Smoking status: Current Everyday Smoker  . Smokeless tobacco: Not on file  . Alcohol Use: No    OB History    Grav Para Term Preterm Abortions TAB SAB  Ect Mult Living                  Review of Systems  All other systems reviewed and are negative.    Allergies  Review of patient's allergies indicates no known allergies.  Home Medications   Current Outpatient Rx  Name Route Sig Dispense Refill  . ENALAPRIL-HYDROCHLOROTHIAZIDE 10-25 MG PO TABS Oral Take 1 tablet by mouth daily.      Marland Kitchen METHOCARBAMOL 750 MG PO TABS Oral Take 750 mg by mouth every 8 (eight) weeks.      . OXYCODONE-ACETAMINOPHEN 7.5-325 MG PO TABS Oral Take 1 tablet by mouth every 4 (four) hours as needed. For pain     . ZOLPIDEM TARTRATE 10 MG PO TABS Oral Take 10 mg by mouth at bedtime as needed. For sleep       BP 113/74  Pulse 60  Temp 98.7 F (37.1 C)  Resp 16  SpO2 100%  Physical Exam  Nursing note and vitals reviewed. Constitutional: She is oriented to person, place, and time. She appears well-developed and well-nourished.  HENT:  Head: Normocephalic and atraumatic.  Eyes:       Normal appearance  Neck: Normal range of motion.  Cardiovascular: Normal rate and regular rhythm.   Pulmonary/Chest: Effort normal and breath sounds normal.       Left chest ttp  Musculoskeletal:       No spinal or low back ttp.  No CVA  ttp. Full ROM of LE but induces pain in posterior legs.  Nml patellar reflexes.  No saddle anesthesia. Distal sensation intact.  2+ DP pulses.  No peripheral edema.   Neurological: She is alert and oriented to person, place, and time.  Skin: Skin is warm and dry. No rash noted.  Psychiatric: She has a normal mood and affect. Her behavior is normal.    ED Course  Procedures (including critical care time)  Labs Reviewed  BASIC METABOLIC PANEL - Abnormal; Notable for the following:    Potassium 3.3 (*)    Glucose, Bld 102 (*)    All other components within normal limits  CBC  DIFFERENTIAL  POCT I-STAT TROPONIN I   Dg Chest 2 View  05/29/2011  *RADIOLOGY REPORT*  Clinical Data: Chest pain and shortness of breath  CHEST - 2 VIEW   Comparison: 03/31/2011  Findings: Moderate mid thoracic spondylosis. Midline trachea. Normal heart size and mediastinal contours. No pleural effusion or pneumothorax.  Diffuse peribronchial thickening.  Clear lungs.  IMPRESSION: 1.  No acute cardiopulmonary disease. 2.  Mild peribronchial thickening which may relate to chronic bronchitis or smoking.  Original Report Authenticated By: Consuello Bossier, M.D.     1. Chest pain   2. Chronic low back pain       MDM  Pt presents w/ c/o chronic low back pain and chronic CP.  No back pain red flag s/sx.  Pain improved w/ 1mg  IV dilaudid.  Pt ambulated w/out difficulty.  Referred to neurosurgery and prescribed short course of percocet.  CP unchanged and atypical but multiple RF.  Clean cardiac cath in 2006, nml stress echo 4 months ago and neg cardiac work up in ED in 03/2011.  Has not followed up with cardiology.  No acute findings on exam, EKG non-ischemic and troponin and CXR neg.  All results discussed w/ pt.  Recommended close f/u with [facility].  Strict return precautions discussed.     Medical screening examination/treatment/procedure(s) were performed by non-physician practitioner and as supervising physician I was immediately available for consultation/collaboration. Osvaldo Human, M.D.      Arie Sabina Charlotte, PA 05/30/11 0059  Carleene Cooper III, MD 05/30/11 [phone]

## 2011-05-29 NOTE — ED Provider Notes (Signed)
4:49 PM  Date: 05/29/2011  Rate: 65  Rhythm: normal sinus rhythm  QRS Axis: normal  Intervals: normal  ST/T Wave abnormalities: normal  Conduction Disutrbances:none  Narrative Interpretation: Normal EKG  Old EKG Reviewed: none available    Carleene Cooper III, MD 05/29/11 1651

## 2011-05-29 NOTE — ED Notes (Signed)
Pt ambulates without difficulty. She states she felt a little dizzy at first, but normalizes quickly.

## 2011-05-30 ENCOUNTER — Encounter (HOSPITAL_COMMUNITY): Payer: Self-pay | Admitting: *Deleted

## 2011-06-27 ENCOUNTER — Encounter (HOSPITAL_COMMUNITY): Payer: Self-pay | Admitting: *Deleted

## 2011-06-27 ENCOUNTER — Other Ambulatory Visit: Payer: Self-pay

## 2011-06-27 ENCOUNTER — Emergency Department (HOSPITAL_COMMUNITY): Payer: BC Managed Care – PPO

## 2011-06-27 ENCOUNTER — Emergency Department (HOSPITAL_COMMUNITY)
Admission: EM | Admit: 2011-06-27 | Discharge: 2011-06-27 | Disposition: A | Discharge status: Home / Self Care | Payer: BC Managed Care – PPO | Attending: Emergency Medicine | Admitting: Emergency Medicine

## 2011-06-27 DIAGNOSIS — M79609 Pain in unspecified limb: Secondary | ICD-10-CM | POA: Insufficient documentation

## 2011-06-27 DIAGNOSIS — R079 Chest pain, unspecified: Secondary | ICD-10-CM | POA: Insufficient documentation

## 2011-06-27 DIAGNOSIS — I10 Essential (primary) hypertension: Secondary | ICD-10-CM | POA: Insufficient documentation

## 2011-06-27 LAB — POCT I-STAT TROPONIN I: Troponin i, poc: 0 ng/mL (ref 0.00–0.08)

## 2011-06-27 LAB — POCT I-STAT, CHEM 8
BUN: 12 mg/dL (ref 6–23)
Hemoglobin: 13.9 g/dL (ref 12.0–15.0)
Sodium: 142 mEq/L (ref 135–145)
TCO2: 29 mmol/L (ref 0–100)

## 2011-06-27 LAB — DIFFERENTIAL
Basophils Absolute: 0 10*3/uL (ref 0.0–0.1)
Eosinophils Relative: 1 % (ref 0–5)
Lymphocytes Relative: 48 % — ABNORMAL HIGH (ref 12–46)

## 2011-06-27 LAB — CBC
MCV: 88.6 fL (ref 78.0–100.0)
Platelets: 294 10*3/uL (ref 150–400)
RDW: 14.2 % (ref 11.5–15.5)
WBC: 4.5 10*3/uL (ref 4.0–10.5)

## 2011-06-27 MED ORDER — MORPHINE SULFATE 4 MG/ML IJ SOLN
4.0000 mg | Freq: Once | INTRAMUSCULAR | Status: AC
  Administered 2011-06-27: 4 mg via INTRAVENOUS
  Filled 2011-06-27: qty 1

## 2011-06-27 NOTE — ED Provider Notes (Signed)
History     CSN: [id number]  Arrival date & time 06/27/11  0907   First MD Initiated Contact with Patient 06/27/11 [phone]      Chief Complaint  Patient presents with  . Chest Pain    (Consider location/radiation/quality/duration/timing/severity/associated sxs/prior treatment) Patient is a 56 y.o. female presenting with chest pain. The history is provided by the patient.  Chest Pain The chest pain began 3 - 5 hours ago. Chest pain occurs frequently. The chest pain is unchanged. The quality of the pain is described as pressure-like and similar to previous episodes. The pain radiates to the left arm. Pertinent negatives for primary symptoms include no fever. Primary symptoms comment: She reports today's symptoms are recurrent over the past year.  Associated symptoms comments: She has had multiple episodes of similar symptoms that has been evaluated by emergency, admissions to the hospital and by cardiology. .  Her past medical history is significant for hyperlipidemia.  Her family medical history is significant for CAD in family and heart disease in family.     Past Medical History  Diagnosis Date  . Hypertension     History reviewed. No pertinent past surgical history.  History reviewed. No pertinent family history.  History  Substance Use Topics  . Smoking status: Current Everyday Smoker -- 0.5 packs/day  . Smokeless tobacco: Not on file  . Alcohol Use: No    OB History    Grav Para Term Preterm Abortions TAB SAB Ect Mult Living                  Review of Systems  Constitutional: Negative for fever and chills.  HENT: Negative.   Respiratory: Negative.   Cardiovascular: Positive for chest pain.  Gastrointestinal: Negative.   Musculoskeletal: Negative.   Skin: Negative.   Neurological: Negative.     Allergies  Review of patient's allergies indicates no known allergies.  Home Medications   Current Outpatient Rx  Name Route Sig Dispense Refill  .  ENALAPRIL-HYDROCHLOROTHIAZIDE 10-25 MG PO TABS Oral Take 1 tablet by mouth daily.      . OXYCODONE-ACETAMINOPHEN 7.5-325 MG PO TABS Oral Take 1 tablet by mouth every 4 (four) hours as needed. For pain     . ZOLPIDEM TARTRATE 10 MG PO TABS Oral Take 10 mg by mouth at bedtime as needed. For sleep       BP 111/80  Pulse 53  Temp(Src) 98 F (36.7 C) (Oral)  Resp 13  SpO2 100%  Physical Exam  Constitutional: She appears well-developed and well-nourished.  HENT:  Head: Normocephalic.  Neck: Normal range of motion. Neck supple.  Cardiovascular: Normal rate and regular rhythm.   Pulmonary/Chest: Effort normal and breath sounds normal. She exhibits no tenderness.  Abdominal: Soft. Bowel sounds are normal. There is no tenderness. There is no rebound and no guarding.  Musculoskeletal: Normal range of motion.  Neurological: She is alert. No cranial nerve deficit.  Skin: Skin is warm and dry. No rash noted.  Psychiatric: She has a normal mood and affect.    ED Course  Procedures (including critical care time)   Labs Reviewed  CBC  DIFFERENTIAL   No results found.   No diagnosis found.    MDM  Old chart reviewed. Cardiac cath in 2006 clear. Cardiology consult [phone] by Dr. Dione Housekeeper shows a negative stress test, negative cardiac work up and she was discharged from further cardiology care.         Rodena Medin, PA-C 06/27/11 1148

## 2011-06-27 NOTE — ED Provider Notes (Signed)
Medical screening examination/treatment/procedure(s) were conducted as a shared visit with non-physician practitioner(s) and myself.  I personally evaluated the patient during the encounter  Acute on chronic chest pain which is been present for the past several hours. It is present in the right parasternal and right sided chest. Is reproducible and palpation  Heart with sinus bradycardia. Reproducible chest pain on palpation.  Workup was negative. I have no suspicion for cardiac etiology. Troponin negative. Discharged home with instructions to followup with her primary care physician. Single troponin is adequate as her pain has been constant for 6 hours  Dayton Bailiff, MD 06/27/11 1155

## 2011-06-27 NOTE — ED Notes (Signed)
Pt states that she has SOB when she is lying down. States that this has happened several times in the past and but that she tries to sleep through it. State that the tightness is not worse or relieved when she is on her sides.

## 2011-06-27 NOTE — ED Notes (Signed)
Per [employer]- pt started having midsternal chest pain, SOB, pain in back and legs. Pt received 2 nitro and 324 mg of Asprin en route. cbg 117, bp 117/88, hr 60, SPO2 100% with NRB.

## 2011-06-28 ENCOUNTER — Emergency Department (HOSPITAL_COMMUNITY): Payer: BC Managed Care – PPO

## 2011-06-28 ENCOUNTER — Encounter (HOSPITAL_COMMUNITY): Payer: Self-pay | Admitting: *Deleted

## 2011-06-28 ENCOUNTER — Emergency Department (HOSPITAL_COMMUNITY)
Admission: EM | Admit: 2011-06-28 | Discharge: 2011-06-28 | Disposition: A | Discharge status: Home / Self Care | Payer: BC Managed Care – PPO | Attending: Emergency Medicine | Admitting: Emergency Medicine

## 2011-06-28 DIAGNOSIS — M545 Low back pain, unspecified: Secondary | ICD-10-CM | POA: Insufficient documentation

## 2011-06-28 DIAGNOSIS — F17200 Nicotine dependence, unspecified, uncomplicated: Secondary | ICD-10-CM | POA: Insufficient documentation

## 2011-06-28 DIAGNOSIS — R079 Chest pain, unspecified: Secondary | ICD-10-CM | POA: Insufficient documentation

## 2011-06-28 DIAGNOSIS — M62830 Muscle spasm of back: Secondary | ICD-10-CM

## 2011-06-28 DIAGNOSIS — R1013 Epigastric pain: Secondary | ICD-10-CM | POA: Insufficient documentation

## 2011-06-28 DIAGNOSIS — M5380 Other specified dorsopathies, site unspecified: Secondary | ICD-10-CM | POA: Insufficient documentation

## 2011-06-28 DIAGNOSIS — I10 Essential (primary) hypertension: Secondary | ICD-10-CM | POA: Insufficient documentation

## 2011-06-28 DIAGNOSIS — Z79899 Other long term (current) drug therapy: Secondary | ICD-10-CM | POA: Insufficient documentation

## 2011-06-28 DIAGNOSIS — M79609 Pain in unspecified limb: Secondary | ICD-10-CM | POA: Insufficient documentation

## 2011-06-28 MED ORDER — OXYCODONE-ACETAMINOPHEN 5-325 MG PO TABS
1.0000 | ORAL_TABLET | Freq: Once | ORAL | Status: AC
  Administered 2011-06-28: 1 via ORAL
  Filled 2011-06-28: qty 1

## 2011-06-28 MED ORDER — IBUPROFEN 800 MG PO TABS: 800.0000 mg | ORAL_TABLET | Freq: Three times a day (TID) | ORAL | Status: AC

## 2011-06-28 MED ORDER — KETOROLAC TROMETHAMINE 60 MG/2ML IM SOLN
60.0000 mg | Freq: Once | INTRAMUSCULAR | Status: AC
  Administered 2011-06-28: 60 mg via INTRAMUSCULAR
  Filled 2011-06-28: qty 2

## 2011-06-28 MED ORDER — METHOCARBAMOL 500 MG PO TABS: 500.0000 mg | ORAL_TABLET | Freq: Two times a day (BID) | ORAL | Status: AC

## 2011-06-28 MED ORDER — METHOCARBAMOL 500 MG PO TABS
1000.0000 mg | ORAL_TABLET | ORAL | Status: AC
  Administered 2011-06-28: 1000 mg via ORAL
  Filled 2011-06-28: qty 2

## 2011-06-28 MED ORDER — DEXAMETHASONE SODIUM PHOSPHATE 4 MG/ML IJ SOLN
4.0000 mg | Freq: Once | INTRAMUSCULAR | Status: AC
  Administered 2011-06-28: 4 mg via INTRAMUSCULAR
  Filled 2011-06-28: qty 1

## 2011-06-28 MED ORDER — FENTANYL CITRATE 0.05 MG/ML IJ SOLN
50.0000 ug | Freq: Once | INTRAMUSCULAR | Status: AC
  Administered 2011-06-28: 100 ug via INTRAMUSCULAR
  Filled 2011-06-28: qty 2

## 2011-06-28 NOTE — ED Provider Notes (Signed)
History     CSN: [id number]  Arrival date & time 06/28/11  3086   First MD Initiated Contact with Patient 06/28/11 [phone]      Chief Complaint  Patient presents with  . Leg Pain    (Consider location/radiation/quality/duration/timing/severity/associated sxs/prior treatment) Patient is a 56 y.o. female presenting with back pain. The history is provided by the patient. No language interpreter was used.  Back Pain  This is a chronic problem. The current episode started more than 1 week ago (but worse with the change in the weather x 4-5 days). The problem occurs constantly. The problem has been gradually worsening. The pain is associated with no known injury. The pain is present in the lumbar spine. The quality of the pain is described as stabbing. The pain radiates to the left thigh and right thigh. The pain is at a severity of 10/10. The pain is severe. The symptoms are aggravated by certain positions. The pain is the same all the time. Pertinent negatives include no chest pain, no fever, no numbness, no weight loss, no headaches, no abdominal pain, no abdominal swelling, no bowel incontinence, no perianal numbness, no bladder incontinence, no dysuria, no pelvic pain, no paresthesias, no paresis, no tingling and no weakness. She has tried analgesics for the symptoms. The treatment provided mild relief. Risk factors include menopause.  States she was here for same and told the previous physicians she was going to return because her pain wasn't controlled.  No bowel or bladder symptoms, no incontinence nor retention.  No instrumentation.  No anticoagulation.  No trauma.   Past Medical History  Diagnosis Date  . Hypertension     History reviewed. No pertinent past surgical history.  History reviewed. No pertinent family history.  History  Substance Use Topics  . Smoking status: Current Everyday Smoker -- 0.5 packs/day  . Smokeless tobacco: Not on file  . Alcohol Use: No    OB History      Grav Para Term Preterm Abortions TAB SAB Ect Mult Living                  Review of Systems  Constitutional: Negative for fever, weight loss and activity change.  HENT: Negative for neck pain and neck stiffness.   Eyes: Negative.   Respiratory: Negative.   Cardiovascular: Negative for chest pain.  Gastrointestinal: Negative for abdominal pain and bowel incontinence.  Genitourinary: Negative for bladder incontinence, dysuria, difficulty urinating and pelvic pain.  Musculoskeletal: Positive for back pain.  Skin: Negative.   Neurological: Negative for tingling, weakness, numbness, headaches and paresthesias.  Hematological: Negative.   Psychiatric/Behavioral: Negative.     Allergies  Review of patient's allergies indicates no known allergies.  Home Medications   Current Outpatient Rx  Name Route Sig Dispense Refill  . ENALAPRIL-HYDROCHLOROTHIAZIDE 10-25 MG PO TABS Oral Take 1 tablet by mouth daily.      . OXYCODONE-ACETAMINOPHEN 7.5-325 MG PO TABS Oral Take 1 tablet by mouth every 4 (four) hours as needed. For pain     . ZOLPIDEM TARTRATE 10 MG PO TABS Oral Take 10 mg by mouth at bedtime as needed. For sleep       BP 118/83  Pulse 64  Temp(Src) 97.8 F (36.6 C) (Oral)  Resp 19  SpO2 98%  Physical Exam  Constitutional: She is oriented to person, place, and time. She appears well-developed and well-nourished.  HENT:  Head: Normocephalic and atraumatic.  Eyes: Conjunctivae are normal. Pupils are equal, round, and reactive  to light.  Neck: Normal range of motion. Neck supple.  Cardiovascular: Normal rate and regular rhythm.   Pulmonary/Chest: Effort normal and breath sounds normal. She has no wheezes. She has no rales.  Abdominal: Soft. Bowel sounds are normal. There is no tenderness. There is no rebound and no guarding.  Musculoskeletal: Normal range of motion. She exhibits no edema and no tenderness.  Neurological: She is alert and oriented to person, place, and time.  She has normal reflexes. She displays normal reflexes.       Intact L5/s1 Intact perineal sensation.  FROM of BLE 5/5 strength normal bulk and tone  Skin: Skin is warm and dry.  Psychiatric: Thought content normal.    ED Course  Procedures (including critical care time)  Labs Reviewed - No data to display Dg Chest Portable 1 View  06/27/2011  *RADIOLOGY REPORT*  Clinical Data:   Mid chest and epigastric pain.  Tobacco use.  PORTABLE CHEST - 1 VIEW  Comparison: 05/29/2011  Findings: Thoracic spondylosis is present.  Heart size is in the upper normal range.  Attenuated peripheral pulmonary vasculature is compatible with emphysema/COPD.  IMPRESSION:  1.  Emphysema. 2.  Thoracic spondylosis. 3.   Otherwise, no significant abnormality identified.  Original Report Authenticated By: Dellia Cloud, M.D.     No diagnosis found.    MDM  No red flags will treat pain and obtain to films.  Patient has active prescriptions for narcotics most recent for 150 pills per [facility] database.  Discussed with patient that she needs to contact her pain management specialist if she has used her months supply and she would be given a prescription for muscles relaxant and prescription strength NSAID.   She should return immediately for weakness numbness bowel or bladder symptoms.  Patient verbalized understanding and was grateful for care and stated follow up.       Jasmine Awe, MD 06/28/11 [phone]

## 2011-06-28 NOTE — ED Notes (Signed)
Discharge inst given  Dr. Nicanor Alcon spoke with her regarding pain meds.

## 2011-06-28 NOTE — ED Notes (Signed)
Patient presents with c/o lower back pain and leg pain.  Was seen here earlier for her chest discomfort

## 2011-12-12 ENCOUNTER — Emergency Department (HOSPITAL_COMMUNITY)
Admission: EM | Admit: 2011-12-12 | Discharge: 2011-12-12 | Disposition: A | Discharge status: Home / Self Care | Payer: BC Managed Care – PPO | Attending: Emergency Medicine | Admitting: Emergency Medicine

## 2011-12-12 ENCOUNTER — Encounter (HOSPITAL_COMMUNITY): Payer: Self-pay | Admitting: Emergency Medicine

## 2011-12-12 ENCOUNTER — Emergency Department (HOSPITAL_COMMUNITY): Payer: BC Managed Care – PPO

## 2011-12-12 DIAGNOSIS — I10 Essential (primary) hypertension: Secondary | ICD-10-CM | POA: Insufficient documentation

## 2011-12-12 DIAGNOSIS — F17200 Nicotine dependence, unspecified, uncomplicated: Secondary | ICD-10-CM | POA: Insufficient documentation

## 2011-12-12 DIAGNOSIS — M79609 Pain in unspecified limb: Secondary | ICD-10-CM | POA: Insufficient documentation

## 2011-12-12 DIAGNOSIS — M1990 Unspecified osteoarthritis, unspecified site: Secondary | ICD-10-CM | POA: Insufficient documentation

## 2011-12-12 MED ORDER — KETOROLAC TROMETHAMINE 60 MG/2ML IM SOLN
60.0000 mg | Freq: Once | INTRAMUSCULAR | Status: AC
  Administered 2011-12-12: 60 mg via INTRAMUSCULAR
  Filled 2011-12-12: qty 2

## 2011-12-12 MED ORDER — METHOCARBAMOL 500 MG PO TABS: 500.0000 mg | ORAL_TABLET | Freq: Two times a day (BID) | ORAL | Status: AC

## 2011-12-12 MED ORDER — CYCLOBENZAPRINE HCL 10 MG PO TABS
10.0000 mg | ORAL_TABLET | Freq: Once | ORAL | Status: DC
  Filled 2011-12-12: qty 1

## 2011-12-12 MED ORDER — PREDNISONE (PAK) 10 MG PO TABS: ORAL_TABLET | ORAL | Status: AC

## 2011-12-12 MED ORDER — METHOCARBAMOL 500 MG PO TABS
500.0000 mg | ORAL_TABLET | Freq: Once | ORAL | Status: AC
  Administered 2011-12-12: 500 mg via ORAL
  Filled 2011-12-12: qty 1

## 2011-12-12 MED ORDER — NAPROXEN 500 MG PO TABS: 500.0000 mg | ORAL_TABLET | Freq: Two times a day (BID) | ORAL | Status: DC

## 2011-12-12 MED ORDER — METHOCARBAMOL 500 MG PO TABS: 500.0000 mg | ORAL_TABLET | Freq: Once | ORAL | Status: DC

## 2011-12-12 MED ORDER — HYDROMORPHONE HCL PF 1 MG/ML IJ SOLN
1.0000 mg | Freq: Once | INTRAMUSCULAR | Status: AC
  Administered 2011-12-12: 1 mg via INTRAMUSCULAR
  Filled 2011-12-12: qty 1

## 2011-12-12 NOTE — ED Notes (Signed)
Pt c/o left lower back pain with radiation down left leg x 2 days

## 2011-12-12 NOTE — ED Provider Notes (Signed)
History     CSN: [id number]  Arrival date & time 12/12/11  1402   First MD Initiated Contact with Patient 12/12/11 1419      2:42 PM HPI Patient last 2 days has had worsening left anterior thigh pain that radiates down entire left lower extremity. States she's been told in the past as she has sciatica. Reports however that she does not have any back pain and does not understand how this could be sciatica. Denies new injury. Reports pain is still not controlled with her Percocet. States she was also prescribed Valium which has not helped her pain. Denies saddle anesthesias, perineal numbness, incontinence, abdominal pain, fever, urinary symptoms.   Patient is a 56 y.o. female presenting with hip pain. The history is provided by the patient.  Hip Pain This is a chronic problem. The current episode started in the past 7 days. The problem occurs constantly. The problem has been gradually worsening. Pertinent negatives include no abdominal pain, chest pain, chills, coughing, fever, headaches, joint swelling, myalgias, neck pain, numbness, rash, sore throat, urinary symptoms, vomiting or weakness. Exacerbated by: siting up and laying flat. Treatments tried: percocet and valium. The treatment provided no relief.    Past Medical History  Diagnosis Date  . Hypertension     History reviewed. No pertinent past surgical history.  History reviewed. No pertinent family history.  History  Substance Use Topics  . Smoking status: Current Everyday Smoker -- 0.5 packs/day  . Smokeless tobacco: Not on file  . Alcohol Use: No    OB History    Grav Para Term Preterm Abortions TAB SAB Ect Mult Living                  Review of Systems  Constitutional: Negative for fever and chills.  HENT: Negative for sore throat, neck pain and neck stiffness.   Respiratory: Negative for cough.   Cardiovascular: Negative for chest pain.  Gastrointestinal: Negative for vomiting and abdominal pain.    Genitourinary: Negative for dysuria, urgency, frequency, hematuria, flank pain and pelvic pain.  Musculoskeletal: Negative for myalgias, back pain and joint swelling.       Hip and lower extremity pain. No saddle anesthesias, perineal numbness,or incontinence.  Skin: Negative for rash.  Neurological: Negative for weakness, numbness and headaches.  All other systems reviewed and are negative.    Allergies  Morphine and related  Home Medications   Current Outpatient Rx  Name Route Sig Dispense Refill  . ENALAPRIL-HYDROCHLOROTHIAZIDE 10-25 MG PO TABS Oral Take 1 tablet by mouth daily.     Marland Kitchen ZOLPIDEM TARTRATE 10 MG PO TABS Oral Take 10 mg by mouth at bedtime as needed. For sleep       BP 125/99  Pulse 60  Temp 98.2 F (36.8 C) (Oral)  Resp 12  SpO2 100%  Physical Exam  Vitals reviewed. Constitutional: She is oriented to person, place, and time. Vital signs are normal. She appears well-developed and well-nourished. No distress.  HENT:  Head: Normocephalic and atraumatic.  Eyes: Pupils are equal, round, and reactive to light.  Neck: Neck supple.  Pulmonary/Chest: Effort normal.  Musculoskeletal:       Left hip: She exhibits decreased range of motion, decreased strength and tenderness.       Lumbar back: Normal. She exhibits normal range of motion, no tenderness, no bony tenderness, no pain and no spasm.  Neurological: She is alert and oriented to person, place, and time.  Skin: Skin is warm and  dry. No rash noted. No erythema. No pallor.  Psychiatric: She has a normal mood and affect. Her behavior is normal.    ED Course  Procedures  Results for orders placed during the hospital encounter of 06/27/11  CBC      Component Value Range   WBC 4.5  4.0 - 10.5 K/uL   RBC 4.30  3.87 - 5.11 MIL/uL   Hemoglobin 12.6  12.0 - 15.0 g/dL   HCT 16.1  09.6 - 04.5 %   MCV 88.6  78.0 - 100.0 fL   MCH 29.3  26.0 - 34.0 pg   MCHC 33.1  30.0 - 36.0 g/dL   RDW 40.9  81.1 - 91.4 %    Platelets 294  150 - 400 K/uL  DIFFERENTIAL      Component Value Range   Neutrophils Relative 38 (*) 43 - 77 %   Neutro Abs 1.7  1.7 - 7.7 K/uL   Lymphocytes Relative 48 (*) 12 - 46 %   Lymphs Abs 2.2  0.7 - 4.0 K/uL   Monocytes Relative 13 (*) 3 - 12 %   Monocytes Absolute 0.6  0.1 - 1.0 K/uL   Eosinophils Relative 1  0 - 5 %   Eosinophils Absolute 0.0  0.0 - 0.7 K/uL   Basophils Relative 0  0 - 1 %   Basophils Absolute 0.0  0.0 - 0.1 K/uL  POCT I-STAT, CHEM 8      Component Value Range   Sodium 142  135 - 145 mEq/L   Potassium 3.6  3.5 - 5.1 mEq/L   Chloride 104  96 - 112 mEq/L   BUN 12  6 - 23 mg/dL   Creatinine, Ser 7.82  0.50 - 1.10 mg/dL   Glucose, Bld 81  70 - 99 mg/dL   Calcium, Ion 9.56  2.13 - 1.32 mmol/L   TCO2 29  0 - 100 mmol/L   Hemoglobin 13.9  12.0 - 15.0 g/dL   HCT 08.6  57.8 - 46.9 %  POCT I-STAT TROPONIN I      Component Value Range   Troponin i, poc 0.00  0.00 - 0.08 ng/mL   Comment 3            Dg Lumbar Spine Complete  12/12/2011  *RADIOLOGY REPORT*  Clinical Data: Left hip and upper leg pain, low back pain off and on for 1 year  LUMBAR SPINE - COMPLETE 4+ VIEW  Comparison: 06/28/2011  Findings: Five non-rib bearing lumbar vertebrae. Osseous demineralization. Mild scattered disc space narrowing and minimal end plate spur formation. Vertebral body heights maintained without fracture or subluxation. No spondylolysis. SI joints symmetric. Facet degenerative changes L5-S1.  IMPRESSION: Osseous demineralization with scattered degenerative disc and facet disease changes as above. No acute abnormalities.  Original Report Authenticated By: Lollie Marrow, M.D.   Dg Hip Complete Left  12/12/2011  *RADIOLOGY REPORT*  Clinical Data: Left hip pain for 1 year.  No known injury.  LEFT HIP - COMPLETE 2+ VIEW  Comparison: None.  Findings: There is no evidence of fracture or dislocation.  There is no evidence of erosive arthropathy or other focal bony abnormality. Slight  degenerative change with very mild joint space narrowing bilaterally.  Osteophytic spurring.  Soft tissues are unremarkable.  IMPRESSION: Mild degenerative change.  No acute findings.  Original Report Authenticated By: Elsie Stain, M.D.     MDM   Patient given IM Dilaudid and Robaxin. Advised x-rays indicated degenerative changes. Will prescribe  NSAIDs, muscle relaxants, and steroids. Advised in conjunction with her chronic pain medication hopefully this will help otherwise  the patient should followup with either orthopedic physician our neurosurgeon. Patient voices understanding and is ready for discharge        Thomasene Lot, Cordelia Poche 12/12/11 1825

## 2011-12-17 NOTE — ED Provider Notes (Signed)
Medical screening examination/treatment/procedure(s) were performed by non-physician practitioner and as supervising physician I was immediately available for consultation/collaboration.    Nelia Shi, MD 12/17/11 [phone]

## 2011-12-25 ENCOUNTER — Encounter (HOSPITAL_COMMUNITY): Payer: Self-pay

## 2011-12-25 ENCOUNTER — Emergency Department (HOSPITAL_COMMUNITY)
Admission: EM | Admit: 2011-12-25 | Discharge: 2011-12-25 | Disposition: A | Discharge status: Home Health | Payer: BC Managed Care – PPO | Attending: Emergency Medicine | Admitting: Emergency Medicine

## 2011-12-25 DIAGNOSIS — M79609 Pain in unspecified limb: Secondary | ICD-10-CM | POA: Insufficient documentation

## 2011-12-25 DIAGNOSIS — M79606 Pain in leg, unspecified: Secondary | ICD-10-CM

## 2011-12-25 DIAGNOSIS — G8929 Other chronic pain: Secondary | ICD-10-CM | POA: Insufficient documentation

## 2011-12-25 DIAGNOSIS — M549 Dorsalgia, unspecified: Secondary | ICD-10-CM | POA: Insufficient documentation

## 2011-12-25 MED ORDER — KETOROLAC TROMETHAMINE 30 MG/ML IJ SOLN
INTRAMUSCULAR | Status: AC
  Administered 2011-12-25: 15 mg
  Filled 2011-12-25: qty 1

## 2011-12-25 MED ORDER — KETOROLAC TROMETHAMINE 15 MG/ML IJ SOLN
15.0000 mg | Freq: Once | INTRAMUSCULAR | Status: DC
  Filled 2011-12-25: qty 1

## 2011-12-25 MED ORDER — DIAZEPAM 5 MG PO TABS
5.0000 mg | ORAL_TABLET | Freq: Once | ORAL | Status: AC
  Administered 2011-12-25: 5 mg via ORAL
  Filled 2011-12-25: qty 1

## 2011-12-25 MED ORDER — CYCLOBENZAPRINE HCL 10 MG PO TABS: 10.0000 mg | ORAL_TABLET | Freq: Three times a day (TID) | ORAL | Status: AC | PRN

## 2011-12-25 MED ORDER — NAPROXEN 375 MG PO TABS: 375.0000 mg | ORAL_TABLET | Freq: Two times a day (BID) | ORAL | Status: DC | PRN

## 2011-12-25 MED ORDER — HYDROMORPHONE HCL PF 1 MG/ML IJ SOLN
1.0000 mg | Freq: Once | INTRAMUSCULAR | Status: AC
  Administered 2011-12-25: 1 mg via INTRAMUSCULAR
  Filled 2011-12-25: qty 1

## 2011-12-25 NOTE — ED Notes (Signed)
Seen here on 17th for same, sts left leg pain and took all meds as prescribed and no relief.

## 2011-12-25 NOTE — ED Provider Notes (Signed)
History    This chart was scribed for [name], MD, MD by Smitty Pluck. The patient was seen in room TR05C and the patient's care was started at 1:34PM.   CSN: [id number]  Arrival date & time 12/25/11  1301   None     Chief Complaint  Patient presents with  . Leg Pain    (Consider location/radiation/quality/duration/timing/severity/associated sxs/prior treatment) Patient is a 56 y.o. female presenting with leg pain. The history is provided by the patient.  Leg Pain    [name] is a 56 y.o. female who presents to the Emergency Department complaining of constant, moderate left leg pain onset 13 days ago. Pt reports having chronic back pain and thinks that pain is radiating from lower back to left leg. Pt reports that she was in ED July 17 and has taken the prescribed medication without relief. Pt reports that she can not walk due to pain. Denies hx of back surgery. Pt denies taking blood anticoagulants.   Past Medical History  Diagnosis Date  . Hypertension     No past surgical history on file.  No family history on file.  History  Substance Use Topics  . Smoking status: Current Everyday Smoker -- 0.5 packs/day  . Smokeless tobacco: Not on file  . Alcohol Use: No    OB History    Grav Para Term Preterm Abortions TAB SAB Ect Mult Living                  Review of Systems  All other systems reviewed and are negative.  10 Systems reviewed and all are negative for acute change except as noted in the HPI.    Allergies  Review of patient's allergies indicates no known allergies.  Home Medications   Current Outpatient Rx  Name Route Sig Dispense Refill  . ENALAPRIL-HYDROCHLOROTHIAZIDE 10-25 MG PO TABS Oral Take 1 tablet by mouth daily.     . ADULT MULTIVITAMIN W/MINERALS CH Oral Take 1 tablet by mouth daily. Patient states she only takes once in a while.    Marland Kitchen NAPROXEN 500 MG PO TABS Oral Take 1 tablet (500 mg total) by mouth 2 (two) times daily. 30 tablet 0   . ZOLPIDEM TARTRATE 10 MG PO TABS Oral Take 10 mg by mouth at bedtime as needed. For sleep       BP 98/72  Pulse 65  Temp 97.5 F (36.4 C) (Oral)  Resp 18  SpO2 96%  Physical Exam  Nursing note and vitals reviewed. Constitutional: She appears well-developed and well-nourished. No distress.  HENT:  Head: Normocephalic and atraumatic.  Eyes: Conjunctivae are normal. Right eye exhibits no discharge. Left eye exhibits no discharge.  Neck: Neck supple.  Cardiovascular: Normal rate, regular rhythm and normal heart sounds.  Exam reveals no gallop and no friction rub.   No murmur heard. Pulmonary/Chest: Effort normal and breath sounds normal. No respiratory distress.  Abdominal: Soft. She exhibits no distension. There is no tenderness.  Musculoskeletal: She exhibits no edema.       Tenderness of right buttock radiating to greater trochanter  Neurovascular intact distally    Neurological: She is alert.  Skin: Skin is warm and dry.  Psychiatric: She has a normal mood and affect. Her behavior is normal. Thought content normal.    ED Course  Procedures (including critical care time) DIAGNOSTIC STUDIES: Oxygen Saturation is 96% on room air, normal by my interpretation.    COORDINATION OF CARE: 1:38PM EDP discusses pt ED  treatment with pt  1:44PM EDP ordered medication:  Scheduled Meds:   . diazepam  5 mg Oral Once  .  HYDROmorphone (DILAUDID) injection  1 mg Intramuscular Once  . ketorolac  15 mg Intramuscular Once   Continuous Infusions:  PRN Meds:.    Labs Reviewed - No data to display No results found.   1. Chronic back pain   2. Leg pain       MDM  56yF with leg and back pain. No evidence of cord impingement. Plan symptomatic tx. Return precautions discussed. Outpt fu.      I personally preformed the services scribed in my presence. The recorded information has been reviewed and considered. [name], MD.    [name], MD 01/03/12 [phone]

## 2012-01-25 ENCOUNTER — Encounter (HOSPITAL_COMMUNITY): Payer: Self-pay | Admitting: Emergency Medicine

## 2012-01-25 ENCOUNTER — Emergency Department (HOSPITAL_COMMUNITY)
Admission: EM | Admit: 2012-01-25 | Discharge: 2012-01-25 | Discharge status: Against Medical Advice | Payer: BC Managed Care – PPO | Attending: Emergency Medicine | Admitting: Emergency Medicine

## 2012-01-25 DIAGNOSIS — T424X1A Poisoning by benzodiazepines, accidental (unintentional), initial encounter: Secondary | ICD-10-CM | POA: Insufficient documentation

## 2012-01-25 DIAGNOSIS — T424X4A Poisoning by benzodiazepines, undetermined, initial encounter: Secondary | ICD-10-CM | POA: Insufficient documentation

## 2012-01-25 DIAGNOSIS — T391X1A Poisoning by 4-Aminophenol derivatives, accidental (unintentional), initial encounter: Secondary | ICD-10-CM | POA: Insufficient documentation

## 2012-01-25 HISTORY — DX: Dorsalgia, unspecified: M54.9

## 2012-01-25 HISTORY — DX: Other chronic pain: G89.29

## 2012-01-25 HISTORY — DX: Unspecified osteoarthritis, unspecified site: M19.90

## 2012-01-25 NOTE — ED Notes (Signed)
Pt reports accidently taking too many pain medicine pills d/t leg pain; 1 percocet 10/325 mg at 4pm, then took another one at 5pm, and took another one at 6pm, then took valium 5mg  at 6pm as well; pt awake alert and oriented X 4 and ambulatory at triage; vss; pt reports felt sob, able to speak in full sentences with no difficulty and states she feels "too high"

## 2012-02-07 ENCOUNTER — Other Ambulatory Visit: Payer: Self-pay | Admitting: Orthopedic Surgery

## 2012-02-07 DIAGNOSIS — M5126 Other intervertebral disc displacement, lumbar region: Secondary | ICD-10-CM

## 2012-02-08 ENCOUNTER — Other Ambulatory Visit: Payer: Self-pay | Admitting: Orthopedic Surgery

## 2012-02-08 ENCOUNTER — Inpatient Hospital Stay: Admission: RE | Admit: 2012-02-08 | Payer: BC Managed Care – PPO | Source: Ambulatory Visit

## 2012-02-08 DIAGNOSIS — M5126 Other intervertebral disc displacement, lumbar region: Secondary | ICD-10-CM

## 2012-02-14 ENCOUNTER — Ambulatory Visit
Admission: RE | Admit: 2012-02-14 | Discharge: 2012-02-14 | Disposition: A | Discharge status: Home / Self Care | Payer: BC Managed Care – PPO | Source: Ambulatory Visit | Attending: Orthopedic Surgery | Admitting: Orthopedic Surgery

## 2012-02-14 VITALS — BP 103/64 | HR 57

## 2012-02-14 DIAGNOSIS — M5126 Other intervertebral disc displacement, lumbar region: Secondary | ICD-10-CM

## 2012-02-14 MED ORDER — DIAZEPAM 5 MG PO TABS
10.0000 mg | ORAL_TABLET | Freq: Once | ORAL | Status: AC
  Administered 2012-02-14: 10 mg via ORAL

## 2012-02-14 MED ORDER — MEPERIDINE HCL 100 MG/ML IJ SOLN
75.0000 mg | Freq: Once | INTRAMUSCULAR | Status: AC
  Administered 2012-02-14: 75 mg via INTRAMUSCULAR

## 2012-02-14 MED ORDER — IOHEXOL 180 MG/ML  SOLN
15.0000 mL | Freq: Once | INTRAMUSCULAR | Status: AC | PRN
  Administered 2012-02-14: 15 mL via INTRATHECAL

## 2012-02-14 MED ORDER — ONDANSETRON HCL 4 MG/2ML IJ SOLN
4.0000 mg | Freq: Once | INTRAMUSCULAR | Status: AC
  Administered 2012-02-14: 4 mg via INTRAMUSCULAR

## 2012-02-20 ENCOUNTER — Other Ambulatory Visit: Payer: BC Managed Care – PPO

## 2012-02-20 ENCOUNTER — Inpatient Hospital Stay
Admission: RE | Admit: 2012-02-20 | Discharge: 2012-02-20 | Payer: BC Managed Care – PPO | Source: Ambulatory Visit | Attending: Orthopedic Surgery | Admitting: Orthopedic Surgery

## 2012-05-12 ENCOUNTER — Encounter (HOSPITAL_COMMUNITY)
Admission: RE | Admit: 2012-05-12 | Discharge: 2012-05-12 | Disposition: A | Discharge status: Home / Self Care | Payer: BC Managed Care – PPO | Source: Ambulatory Visit | Attending: Orthopedic Surgery | Admitting: Orthopedic Surgery

## 2012-05-12 ENCOUNTER — Encounter (HOSPITAL_COMMUNITY): Payer: Self-pay | Admitting: Pharmacy Technician

## 2012-05-12 ENCOUNTER — Ambulatory Visit (HOSPITAL_COMMUNITY)
Admission: RE | Admit: 2012-05-12 | Discharge: 2012-05-12 | Disposition: A | Discharge status: Home / Self Care | Payer: BC Managed Care – PPO | Source: Ambulatory Visit | Attending: Surgical | Admitting: Surgical

## 2012-05-12 ENCOUNTER — Encounter (HOSPITAL_COMMUNITY): Payer: Self-pay

## 2012-05-12 DIAGNOSIS — I498 Other specified cardiac arrhythmias: Secondary | ICD-10-CM | POA: Insufficient documentation

## 2012-05-12 DIAGNOSIS — Z01818 Encounter for other preprocedural examination: Secondary | ICD-10-CM | POA: Insufficient documentation

## 2012-05-12 DIAGNOSIS — Z01810 Encounter for preprocedural cardiovascular examination: Secondary | ICD-10-CM | POA: Insufficient documentation

## 2012-05-12 DIAGNOSIS — Z01812 Encounter for preprocedural laboratory examination: Secondary | ICD-10-CM | POA: Insufficient documentation

## 2012-05-12 HISTORY — DX: Anxiety disorder, unspecified: F41.9

## 2012-05-12 HISTORY — DX: Chest pain, unspecified: R07.9

## 2012-05-12 LAB — PROTIME-INR
INR: 0.89 (ref 0.00–1.49)
Prothrombin Time: 12 seconds (ref 11.6–15.2)

## 2012-05-12 LAB — URINALYSIS, ROUTINE W REFLEX MICROSCOPIC
Bilirubin Urine: NEGATIVE
Glucose, UA: NEGATIVE mg/dL
Hgb urine dipstick: NEGATIVE
Ketones, ur: NEGATIVE mg/dL
Leukocytes, UA: NEGATIVE
Nitrite: NEGATIVE
Protein, ur: NEGATIVE mg/dL
Specific Gravity, Urine: 1.011 (ref 1.005–1.030)
Urobilinogen, UA: 0.2 mg/dL (ref 0.0–1.0)
pH: 5 (ref 5.0–8.0)

## 2012-05-12 LAB — APTT: aPTT: 40 seconds — ABNORMAL HIGH (ref 24–37)

## 2012-05-12 LAB — CBC
HCT: 40.1 % (ref 36.0–46.0)
Hemoglobin: 13.1 g/dL (ref 12.0–15.0)
MCH: 29.2 pg (ref 26.0–34.0)
MCHC: 32.7 g/dL (ref 30.0–36.0)
MCV: 89.3 fL (ref 78.0–100.0)

## 2012-05-12 LAB — COMPREHENSIVE METABOLIC PANEL
ALT: 6 U/L (ref 0–35)
AST: 14 U/L (ref 0–37)
Albumin: 3.7 g/dL (ref 3.5–5.2)
Alkaline Phosphatase: 62 U/L (ref 39–117)
BUN: 13 mg/dL (ref 6–23)
CO2: 31 mEq/L (ref 19–32)
Calcium: 9.7 mg/dL (ref 8.4–10.5)
Chloride: 102 mEq/L (ref 96–112)
Creatinine, Ser: 0.79 mg/dL (ref 0.50–1.10)
GFR calc Af Amer: >90 mL/min (ref >90)
GFR calc non Af Amer: >90 mL/min (ref >90)
Glucose, Bld: 75 mg/dL (ref 70–99)
Potassium: 3.6 mEq/L (ref 3.5–5.1)
Sodium: 142 mEq/L (ref 135–145)
Total Bilirubin: 0.2 mg/dL — ABNORMAL LOW (ref 0.3–1.2)
Total Protein: 6.7 g/dL (ref 6.0–8.3)

## 2012-05-12 NOTE — Patient Instructions (Signed)
[name]  05/12/2012   Your procedure is scheduled on: 05/14/12  Report to [facility] at [phone].  Call this number if you have problems the morning of surgery 336-: [phone]   Remember:   Do not eat food or drink liquids After Midnight.     Take these medicines the morning of surgery with A SIP OF WATER: percocet if needed   Do not wear jewelry, make-up or nail polish.  Do not wear lotions, powders, or perfumes. You may wear deodorant.  Do not shave 48 hours prior to surgery. Men may shave face and neck.  Do not bring valuables to the hospital.  Contacts, dentures or bridgework may not be worn into surgery.  Leave suitcase in the car. After surgery it may be brought to your room.  For patients admitted to the hospital, checkout time is 11:00 AM the day of discharge.     Special Instructions: Shower using CHG 2 nights before surgery and the night before surgery.  If you shower the day of surgery use CHG.  Use special wash - you have one bottle of CHG for all showers.  You should use approximately 1/3 of the bottle for each shower.   Please read over the following fact sheets that you were given: MRSA Information,  Incentive spirometer fact sheet  Birdie Sons, RN  pre op nurse call if needed [phone]    FAILURE TO FOLLOW THESE INSTRUCTIONS MAY RESULT IN CANCELLATION OF YOUR SURGERY   Patient Signature: ___________________________________________

## 2012-05-12 NOTE — Progress Notes (Signed)
Abnormal PTT results routed to Dr, Gioffre via EPIC 

## 2012-05-13 NOTE — H&P (Signed)
[name] DOB: 01/19/1956  Chief Complaint: back pain   History of Present Illness The patient is a 56 year old female who is scheduled for a lumbar hemilaminectomy and microdiscectomy L4-L5 on the left to be performed by Dr. Georges Lynch. Regina Hillock, MD at [facility] on Wednesday May 14, 2012. The patient has had low back pain radiating into the left lower extremity for the past several months. She does not recall a specific injury but has had low back pain for several years, for which she saw Dr. Ethelene Hal in 2007. She has been treated with a prednisone pak and pain medication which has not given her relief. CT myelogram show disc fragment at L4-L5 on the left..   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 01/29/2006 Osteoarthritis Sleep Apnea Hypertension Anxiety Disorder  Family History Diabetes Mellitus. mother Drug / Alcohol Addiction. brother Heart disease in female family member before age 57 Congestive Heart Failure. mother, grandmother mothers side and grandmother fathers side Cancer. father, sister and brother Hypertension. mother, sister and grandmother mothers side Osteoarthritis. mother and sister   Social History Exercise. Exercises rarely; does running / walking Living situation. live alone Children. 3 Current work status. disabled Drug/Alcohol Rehab (Currently). no Tobacco use. current every day smoker; smoke(d) 1 pack(s) per day; uses 2 or more can(s) smokeless per week Tobacco / smoke exposure. no Marital status. divorced Pain Contract. yes Alcohol use. never consumed alcohol   Medication History Percocet (10-325MG  Tablet, 1 (one) Tablet Oral 1 TAB Q 4-6 HRS PRN PAIN Enalapril-Hydrochlorothiazide (10-25MG  Tablet, Oral) Active. Zolpidem Tartrate (10MG  Tablet, Oral) Active. Diazepam (5MG  Tablet, Oral) Active.   Past Surgical History Hysterectomy. complete (cancerous) Tonsillectomy    Review of Systems General:Not Present- Chills, Fever, Night Sweats, Fatigue, Weight Gain, Weight Loss and Memory Loss. Skin:Not Present- Hives, Itching, Rash, Eczema and Lesions. HEENT:Not Present- Tinnitus, Headache, Double Vision, Visual Loss, Hearing Loss and Dentures. Respiratory:Not Present- Shortness of breath with exertion, Shortness of breath at rest, Allergies, Coughing up blood and Chronic Cough. Cardiovascular:Not Present- Chest Pain, Racing/skipping heartbeats, Difficulty Breathing Lying Down, Murmur, Swelling and Palpitations. Gastrointestinal:Not Present- Bloody Stool, Heartburn, Abdominal Pain, Vomiting, Nausea, Constipation, Diarrhea, Difficulty Swallowing, Jaundice and Loss of appetitie. Female Genitourinary:Not Present- Blood in Urine, Urinary frequency, Weak urinary stream, Discharge, Flank Pain, Incontinence, Painful Urination, Urgency, Urinary Retention and Urinating at Night. Musculoskeletal: Present- Muscle Weakness, Muscle Pain, Joint Swelling, Joint Pain, Back Pain, Morning Stiffness and Spasms. Neurological:Not Present- Tremor, Dizziness, Blackout spells, Paralysis, Difficulty with balance and Weakness. Psychiatric:Not Present- Insomnia.   Vitals Weight: 135 lb Height: 61 in Body Surface Area: 1.62 m Body Mass Index: 25.51 kg/m Pulse: 63 (Regular) BP: 112/70 (Sitting, Left Arm, Standard)    Physical Exam General Mental Status - Alert, cooperative and good historian. General Appearance- pleasant. Not in acute distress. Orientation- Oriented X3. Build & Nutrition- Well nourished and Well developed. Head and Neck Head- normocephalic, atraumatic . Neck Global Assessment- supple. no bruit auscultated on the right and no bruit auscultated on the left. Eye Pupil- Bilateral- Regular and Round. Motion- Bilateral- EOMI. Chest and Lung Exam Auscultation: Breath sounds:- clear at anterior chest wall and - clear at  posterior chest wall. Adventitious sounds:- No Adventitious sounds. Cardiovascular Auscultation:Rhythm- Regular rate and rhythm. Heart Sounds- S1 WNL and S2 WNL. Murmurs & Other Heart Sounds:Auscultation of the heart reveals - No Murmurs. Abdomen Palpation/Percussion:Tenderness- Abdomen is non-tender to palpation. Rigidity (guarding)- Abdomen is soft. Auscultation:Auscultation of the abdomen reveals - Bowel sounds  normal. Female Genitourinary Not done, not pertinent to present illness Peripheral Vascular Upper Extremity: Palpation:- Pulses bilaterally normal. Lower Extremity: Palpation:- Pulses bilaterally normal. Neurologic Examination of related systems reveals - normal muscle strength and tone in all extremities. Neurologic evaluation reveals - normal sensation and upper and lower extremity deep tendon reflexes intact bilaterally . Musculoskeletal Normal painless motion in the hips and knees. Negative SLR on the right. Positive on the right. Pain with motion of the lumbar spine causing radicular pain into the left posterior thigh.   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 Patient will be kept for observation overnight.     Dimitri Ped, PA-C

## 2012-05-14 ENCOUNTER — Ambulatory Visit (HOSPITAL_COMMUNITY): Payer: BC Managed Care – PPO

## 2012-05-14 ENCOUNTER — Observation Stay (HOSPITAL_COMMUNITY)
Admission: RE | Admit: 2012-05-14 | Discharge: 2012-05-16 | Disposition: A | Discharge status: Home / Self Care | Payer: BC Managed Care – PPO | Source: Ambulatory Visit | Attending: Orthopedic Surgery | Admitting: Orthopedic Surgery

## 2012-05-14 ENCOUNTER — Encounter (HOSPITAL_COMMUNITY): Payer: Self-pay | Admitting: Anesthesiology

## 2012-05-14 ENCOUNTER — Ambulatory Visit (HOSPITAL_COMMUNITY): Payer: BC Managed Care – PPO | Admitting: Anesthesiology

## 2012-05-14 ENCOUNTER — Encounter (HOSPITAL_COMMUNITY): Payer: Self-pay | Admitting: *Deleted

## 2012-05-14 ENCOUNTER — Encounter (HOSPITAL_COMMUNITY)
Admission: RE | Disposition: A | Discharge status: Home / Self Care | Payer: Self-pay | Source: Ambulatory Visit | Attending: Orthopedic Surgery

## 2012-05-14 DIAGNOSIS — R1032 Left lower quadrant pain: Secondary | ICD-10-CM | POA: Insufficient documentation

## 2012-05-14 DIAGNOSIS — Z90710 Acquired absence of both cervix and uterus: Secondary | ICD-10-CM | POA: Insufficient documentation

## 2012-05-14 DIAGNOSIS — Z9889 Other specified postprocedural states: Secondary | ICD-10-CM

## 2012-05-14 DIAGNOSIS — M5106 Intervertebral disc disorders with myelopathy, lumbar region: Secondary | ICD-10-CM | POA: Diagnosis present

## 2012-05-14 DIAGNOSIS — M25559 Pain in unspecified hip: Secondary | ICD-10-CM | POA: Insufficient documentation

## 2012-05-14 DIAGNOSIS — M5126 Other intervertebral disc displacement, lumbar region: Principal | ICD-10-CM | POA: Insufficient documentation

## 2012-05-14 DIAGNOSIS — M216X9 Other acquired deformities of unspecified foot: Secondary | ICD-10-CM | POA: Insufficient documentation

## 2012-05-14 HISTORY — PX: HEMI-MICRODISCECTOMY LUMBAR LAMINECTOMY LEVEL 1: SHX5846

## 2012-05-14 LAB — COMPREHENSIVE METABOLIC PANEL
ALT: 9 U/L (ref 0–35)
AST: 19 U/L (ref 0–37)
Albumin: 3.4 g/dL — ABNORMAL LOW (ref 3.5–5.2)
Alkaline Phosphatase: 60 U/L (ref 39–117)
Chloride: 101 mEq/L (ref 96–112)
Potassium: 3.4 mEq/L — ABNORMAL LOW (ref 3.5–5.1)
Sodium: 139 mEq/L (ref 135–145)
Total Bilirubin: 0.3 mg/dL (ref 0.3–1.2)

## 2012-05-14 LAB — PROTIME-INR
INR: 0.86 (ref 0.00–1.49)
Prothrombin Time: 11.7 seconds (ref 11.6–15.2)

## 2012-05-14 SURGERY — HEMI-MICRODISCECTOMY LUMBAR LAMINECTOMY LEVEL 1
Anesthesia: General | Site: Back | Wound class: Clean

## 2012-05-14 MED ORDER — ACETAMINOPHEN 10 MG/ML IV SOLN
INTRAVENOUS | Status: AC
  Filled 2012-05-14: qty 100

## 2012-05-14 MED ORDER — ONDANSETRON HCL 4 MG/2ML IJ SOLN
INTRAMUSCULAR | Status: DC | PRN
  Administered 2012-05-14: 4 mg via INTRAVENOUS

## 2012-05-14 MED ORDER — PROMETHAZINE HCL 25 MG/ML IJ SOLN: 6.2500 mg | INTRAMUSCULAR | Status: DC | PRN

## 2012-05-14 MED ORDER — HYDROCODONE-ACETAMINOPHEN 5-325 MG PO TABS
1.0000 | ORAL_TABLET | ORAL | Status: DC | PRN
  Filled 2012-05-14: qty 2

## 2012-05-14 MED ORDER — HYDROMORPHONE HCL PF 1 MG/ML IJ SOLN
INTRAMUSCULAR | Status: AC
  Filled 2012-05-14: qty 2

## 2012-05-14 MED ORDER — OXYCODONE-ACETAMINOPHEN 5-325 MG PO TABS
1.0000 | ORAL_TABLET | ORAL | Status: DC | PRN
  Administered 2012-05-15 – 2012-05-16 (×6): 2 via ORAL
  Filled 2012-05-14 (×6): qty 2

## 2012-05-14 MED ORDER — METHOCARBAMOL 500 MG PO TABS
500.0000 mg | ORAL_TABLET | Freq: Four times a day (QID) | ORAL | Status: DC | PRN
Start: 2012-05-14 — End: 2012-05-16
  Administered 2012-05-16: 500 mg via ORAL
  Filled 2012-05-14: qty 1

## 2012-05-14 MED ORDER — GLYCOPYRROLATE 0.2 MG/ML IJ SOLN
INTRAMUSCULAR | Status: DC | PRN
  Administered 2012-05-14: .6 mg via INTRAVENOUS

## 2012-05-14 MED ORDER — BACITRACIN-NEOMYCIN-POLYMYXIN 400-5-5000 EX OINT
TOPICAL_OINTMENT | CUTANEOUS | Status: DC | PRN
  Administered 2012-05-14: 1 via TOPICAL

## 2012-05-14 MED ORDER — DIAZEPAM 5 MG PO TABS
5.0000 mg | ORAL_TABLET | Freq: Three times a day (TID) | ORAL | Status: DC | PRN
  Administered 2012-05-15 (×2): 5 mg via ORAL
  Filled 2012-05-14 (×2): qty 1

## 2012-05-14 MED ORDER — POLYETHYLENE GLYCOL 3350 17 G PO PACK: 17.0000 g | PACK | Freq: Every day | ORAL | Status: DC | PRN

## 2012-05-14 MED ORDER — THROMBIN 5000 UNITS EX SOLR
CUTANEOUS | Status: DC | PRN
  Administered 2012-05-14: 5000 [IU] via TOPICAL

## 2012-05-14 MED ORDER — MIDAZOLAM HCL 5 MG/5ML IJ SOLN
INTRAMUSCULAR | Status: DC | PRN
  Administered 2012-05-14: 2 mg via INTRAVENOUS

## 2012-05-14 MED ORDER — KETOROLAC TROMETHAMINE 30 MG/ML IJ SOLN: 15.0000 mg | Freq: Once | INTRAMUSCULAR | Status: DC | PRN

## 2012-05-14 MED ORDER — OXYCODONE HCL 5 MG PO TABS
5.0000 mg | ORAL_TABLET | ORAL | Status: DC | PRN
  Administered 2012-05-14 – 2012-05-16 (×4): 10 mg via ORAL
  Administered 2012-05-16: 5 mg via ORAL
  Filled 2012-05-14: qty 2
  Filled 2012-05-14: qty 1
  Filled 2012-05-14 (×4): qty 2

## 2012-05-14 MED ORDER — CEFAZOLIN SODIUM-DEXTROSE 2-3 GM-% IV SOLR
2.0000 g | INTRAVENOUS | Status: AC
  Administered 2012-05-14: 2 g via INTRAVENOUS

## 2012-05-14 MED ORDER — ACETAMINOPHEN 10 MG/ML IV SOLN
INTRAVENOUS | Status: DC | PRN
  Administered 2012-05-14: 1000 mg via INTRAVENOUS

## 2012-05-14 MED ORDER — BUPIVACAINE-EPINEPHRINE (PF) 0.5% -1:200000 IJ SOLN
INTRAMUSCULAR | Status: AC
  Filled 2012-05-14: qty 10

## 2012-05-14 MED ORDER — PHENOL 1.4 % MT LIQD: 1.0000 | OROMUCOSAL | Status: DC | PRN

## 2012-05-14 MED ORDER — ONDANSETRON HCL 4 MG/2ML IJ SOLN: 4.0000 mg | INTRAMUSCULAR | Status: DC | PRN

## 2012-05-14 MED ORDER — HYDROMORPHONE HCL PF 1 MG/ML IJ SOLN
INTRAMUSCULAR | Status: DC | PRN
  Administered 2012-05-14: 1 mg via INTRAVENOUS

## 2012-05-14 MED ORDER — LACTATED RINGERS IV SOLN
INTRAVENOUS | Status: DC
  Administered 2012-05-14 – 2012-05-15 (×3): via INTRAVENOUS

## 2012-05-14 MED ORDER — LIDOCAINE HCL (CARDIAC) 20 MG/ML IV SOLN
INTRAVENOUS | Status: DC | PRN
  Administered 2012-05-14: 80 mg via INTRAVENOUS

## 2012-05-14 MED ORDER — PROPOFOL 10 MG/ML IV BOLUS
INTRAVENOUS | Status: DC | PRN
  Administered 2012-05-14: 160 mg via INTRAVENOUS

## 2012-05-14 MED ORDER — NEOSTIGMINE METHYLSULFATE 1 MG/ML IJ SOLN
INTRAMUSCULAR | Status: DC | PRN
  Administered 2012-05-14: 4 mg via INTRAVENOUS

## 2012-05-14 MED ORDER — ENALAPRIL-HYDROCHLOROTHIAZIDE 10-25 MG PO TABS: 1.0000 | ORAL_TABLET | Freq: Every day | ORAL | Status: DC

## 2012-05-14 MED ORDER — MENTHOL 3 MG MT LOZG: 1.0000 | LOZENGE | OROMUCOSAL | Status: DC | PRN

## 2012-05-14 MED ORDER — OXYCODONE-ACETAMINOPHEN 10-325 MG PO TABS: 1.0000 | ORAL_TABLET | ORAL | Status: DC | PRN

## 2012-05-14 MED ORDER — BUPIVACAINE LIPOSOME 1.3 % IJ SUSP
20.0000 mL | INTRAMUSCULAR | Status: AC
  Filled 2012-05-14: qty 20

## 2012-05-14 MED ORDER — FENTANYL CITRATE 0.05 MG/ML IJ SOLN
INTRAMUSCULAR | Status: DC | PRN
  Administered 2012-05-14 (×3): 50 ug via INTRAVENOUS
  Administered 2012-05-14: 100 ug via INTRAVENOUS

## 2012-05-14 MED ORDER — METHOCARBAMOL 100 MG/ML IJ SOLN
500.0000 mg | Freq: Four times a day (QID) | INTRAVENOUS | Status: DC | PRN
  Administered 2012-05-14: 500 mg via INTRAVENOUS
  Filled 2012-05-14: qty 5

## 2012-05-14 MED ORDER — HYDROCHLOROTHIAZIDE 25 MG PO TABS
25.0000 mg | ORAL_TABLET | Freq: Every day | ORAL | Status: DC
  Administered 2012-05-14 – 2012-05-16 (×3): 25 mg via ORAL
  Filled 2012-05-14 (×3): qty 1

## 2012-05-14 MED ORDER — BUPIVACAINE LIPOSOME 1.3 % IJ SUSP
INTRAMUSCULAR | Status: DC | PRN
  Administered 2012-05-14: 20 mL

## 2012-05-14 MED ORDER — ROCURONIUM BROMIDE 100 MG/10ML IV SOLN
INTRAVENOUS | Status: DC | PRN
  Administered 2012-05-14: 5 mg via INTRAVENOUS
  Administered 2012-05-14: 50 mg via INTRAVENOUS

## 2012-05-14 MED ORDER — OXYCODONE-ACETAMINOPHEN 5-325 MG PO TABS: 1.0000 | ORAL_TABLET | ORAL | Status: DC | PRN

## 2012-05-14 MED ORDER — ZOLPIDEM TARTRATE 5 MG PO TABS: 5.0000 mg | ORAL_TABLET | Freq: Every evening | ORAL | Status: DC | PRN

## 2012-05-14 MED ORDER — BUPIVACAINE-EPINEPHRINE PF 0.5-1:200000 % IJ SOLN
INTRAMUSCULAR | Status: DC | PRN
  Administered 2012-05-14: 20 mL

## 2012-05-14 MED ORDER — CEFAZOLIN SODIUM 1-5 GM-% IV SOLN
1.0000 g | Freq: Three times a day (TID) | INTRAVENOUS | Status: AC
  Administered 2012-05-14 – 2012-05-15 (×3): 1 g via INTRAVENOUS
  Filled 2012-05-14 (×3): qty 50

## 2012-05-14 MED ORDER — SODIUM CHLORIDE 0.9 % IR SOLN
Status: DC | PRN
  Administered 2012-05-14: 09:00:00

## 2012-05-14 MED ORDER — ENALAPRIL MALEATE 10 MG PO TABS
10.0000 mg | ORAL_TABLET | Freq: Every day | ORAL | Status: DC
  Administered 2012-05-14 – 2012-05-16 (×3): 10 mg via ORAL
  Filled 2012-05-14 (×3): qty 1

## 2012-05-14 MED ORDER — HYDROMORPHONE HCL PF 1 MG/ML IJ SOLN
0.2500 mg | INTRAMUSCULAR | Status: DC | PRN
  Administered 2012-05-14: 0.25 mg via INTRAVENOUS

## 2012-05-14 MED ORDER — FLEET ENEMA 7-19 GM/118ML RE ENEM: 1.0000 | ENEMA | Freq: Once | RECTAL | Status: AC | PRN

## 2012-05-14 MED ORDER — HYDROMORPHONE HCL PF 1 MG/ML IJ SOLN
0.5000 mg | INTRAMUSCULAR | Status: DC | PRN
Start: 2012-05-14 — End: 2012-05-16
  Administered 2012-05-14 – 2012-05-15 (×8): 1 mg via INTRAVENOUS
  Filled 2012-05-14 (×9): qty 1

## 2012-05-14 MED ORDER — BISACODYL 10 MG RE SUPP
10.0000 mg | Freq: Every day | RECTAL | Status: DC | PRN
  Administered 2012-05-15: 10 mg via RECTAL
  Filled 2012-05-14: qty 1

## 2012-05-14 MED ORDER — LACTATED RINGERS IV SOLN
INTRAVENOUS | Status: DC
  Administered 2012-05-14: 08:00:00 via INTRAVENOUS

## 2012-05-14 MED ORDER — BACITRACIN-NEOMYCIN-POLYMYXIN 400-5-5000 EX OINT
TOPICAL_OINTMENT | CUTANEOUS | Status: AC
  Filled 2012-05-14: qty 1

## 2012-05-14 MED ORDER — THROMBIN 5000 UNITS EX SOLR
CUTANEOUS | Status: AC
  Filled 2012-05-14: qty 5000

## 2012-05-14 MED ORDER — CEFAZOLIN SODIUM-DEXTROSE 2-3 GM-% IV SOLR
INTRAVENOUS | Status: AC
  Filled 2012-05-14: qty 50

## 2012-05-14 SURGICAL SUPPLY — 48 items
BAG ZIPLOCK 12X15 (MISCELLANEOUS) ×2 IMPLANT
BENZOIN TINCTURE PRP APPL 2/3 (GAUZE/BANDAGES/DRESSINGS) IMPLANT
CLEANER TIP ELECTROSURG 2X2 (MISCELLANEOUS) ×2 IMPLANT
CLOTH BEACON ORANGE TIMEOUT ST (SAFETY) ×2 IMPLANT
CONT SPEC 4OZ CLIKSEAL STRL BL (MISCELLANEOUS) ×2 IMPLANT
CONT SPECI 4OZ STER CLIK (MISCELLANEOUS) ×2 IMPLANT
DECANTER SPIKE VIAL GLASS SM (MISCELLANEOUS) ×2 IMPLANT
DRAIN PENROSE 18X1/4 LTX STRL (WOUND CARE) IMPLANT
DRAPE LG THREE QUARTER DISP (DRAPES) ×2 IMPLANT
DRAPE MICROSCOPE LEICA (MISCELLANEOUS) ×2 IMPLANT
DRAPE POUCH INSTRU U-SHP 10X18 (DRAPES) ×2 IMPLANT
DRAPE SURG 17X11 SM STRL (DRAPES) IMPLANT
DRSG ADAPTIC 3X8 NADH LF (GAUZE/BANDAGES/DRESSINGS) ×2 IMPLANT
DRSG PAD ABDOMINAL 8X10 ST (GAUZE/BANDAGES/DRESSINGS) ×8 IMPLANT
DURAPREP 26ML APPLICATOR (WOUND CARE) ×2 IMPLANT
ELECT REM PT RETURN 9FT ADLT (ELECTROSURGICAL) ×2
ELECTRODE REM PT RTRN 9FT ADLT (ELECTROSURGICAL) ×1 IMPLANT
GLOVE BIOGEL PI IND STRL 8 (GLOVE) ×1 IMPLANT
GLOVE BIOGEL PI IND STRL 8.5 (GLOVE) ×1 IMPLANT
GLOVE BIOGEL PI INDICATOR 8 (GLOVE) ×1
GLOVE BIOGEL PI INDICATOR 8.5 (GLOVE) ×1
GLOVE ECLIPSE 8.0 STRL XLNG CF (GLOVE) ×4 IMPLANT
GLOVE SURG SS PI 8.0 STRL IVOR (GLOVE) ×4 IMPLANT
GOWN PREVENTION PLUS LG XLONG (DISPOSABLE) ×4 IMPLANT
GOWN STRL REIN XL XLG (GOWN DISPOSABLE) ×4 IMPLANT
KIT BASIN OR (CUSTOM PROCEDURE TRAY) ×2 IMPLANT
KIT POSITIONING SURG ANDREWS (MISCELLANEOUS) ×2 IMPLANT
MANIFOLD NEPTUNE II (INSTRUMENTS) ×2 IMPLANT
NEEDLE SPNL 18GX3.5 QUINCKE PK (NEEDLE) ×6 IMPLANT
NS IRRIG 1000ML POUR BTL (IV SOLUTION) ×2 IMPLANT
PATTIES SURGICAL .5 X.5 (GAUZE/BANDAGES/DRESSINGS) IMPLANT
PATTIES SURGICAL .75X.75 (GAUZE/BANDAGES/DRESSINGS) IMPLANT
PATTIES SURGICAL 1X1 (DISPOSABLE) IMPLANT
PIN SAFETY NICK PLATE  2 MED (MISCELLANEOUS)
PIN SAFETY NICK PLATE 2 MED (MISCELLANEOUS) IMPLANT
POSITIONER SURGICAL ARM (MISCELLANEOUS) ×2 IMPLANT
SPONGE GAUZE 4X4 12PLY (GAUZE/BANDAGES/DRESSINGS) ×2 IMPLANT
SPONGE LAP 4X18 X RAY DECT (DISPOSABLE) ×2 IMPLANT
SPONGE SURGIFOAM ABS GEL 100 (HEMOSTASIS) ×2 IMPLANT
STAPLER VISISTAT 35W (STAPLE) IMPLANT
SUT VIC AB 0 CT1 27 (SUTURE) ×1
SUT VIC AB 0 CT1 27XBRD ANTBC (SUTURE) ×1 IMPLANT
SUT VIC AB 1 CT1 27 (SUTURE) ×3
SUT VIC AB 1 CT1 27XBRD ANTBC (SUTURE) ×3 IMPLANT
TAPE CLOTH SURG 6X10 WHT LF (GAUZE/BANDAGES/DRESSINGS) ×2 IMPLANT
TOWEL OR 17X26 10 PK STRL BLUE (TOWEL DISPOSABLE) ×4 IMPLANT
TRAY LAMINECTOMY (CUSTOM PROCEDURE TRAY) ×2 IMPLANT
WATER STERILE IRR 1500ML POUR (IV SOLUTION) ×2 IMPLANT

## 2012-05-14 NOTE — Anesthesia Postprocedure Evaluation (Signed)
  Anesthesia Post-op Note  Patient: [name]  Procedure(s) Performed: Procedure(s) (LRB): HEMI-MICRODISCECTOMY LUMBAR LAMINECTOMY LEVEL 1 (N/A)  Patient Location: PACU  Anesthesia Type: General  Level of Consciousness: awake and alert   Airway and Oxygen Therapy: Patient Spontanous Breathing  Post-op Pain: mild  Post-op Assessment: Post-op Vital signs reviewed, Patient's Cardiovascular Status Stable, Respiratory Function Stable, Patent Airway and No signs of Nausea or vomiting  Last Vitals:  Filed Vitals:   05/14/12 1016  BP: 146/59  Pulse: 64  Temp: 36.6 C  Resp: 10    Post-op Vital Signs: stable   Complications: No apparent anesthesia complications

## 2012-05-14 NOTE — Progress Notes (Signed)
IV lt hand intact and unremarkable.

## 2012-05-14 NOTE — Op Note (Signed)
[name], [name]                ACCOUNT NO.:  [id number]  MEDICAL RECORD NO.:  [id number]  LOCATION:  1605                         FACILITY:  [facility]  PHYSICIAN:  Georges Lynch. Arlone Lenhardt, M.D.DATE OF BIRTH:  May 24, 1956  DATE OF PROCEDURE:  05/14/2012 DATE OF DISCHARGE:                              OPERATIVE REPORT   SURGEON:  Georges Lynch. [name], MD  ASSISTANT:  Jene Every, MD  PREOPERATIVE DIAGNOSES: 1. 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 2. Partial foot drop on the left. 3. Herniated disk at L4-5 on the left.  This was a foraminal disk.  POSTOPERATIVE DIAGNOSES: 1. 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 2. Partial foot drop on the left. 3. Herniated disk at L4-5 on the left.  This was a foraminal disk.  PROCEDURE:  Under general anesthesia, routine orthopedic prep and draping of the back was carried out with the patient on spinal frame. Appropriate time-out was first carried out and I also marked the appropriate left side of the back in the holding area.  At this time, 2 needles were placed in the back for localization purposes.  X-ray was taken.  Once we localized the space, an incision was made over the L4-5 interspace.  Bleeders were identified and cauterized.  I then stripped the muscle from the lamina and spinous process bilaterally.  I then inserted the [facility] retractors.  Another x-ray was taken for localization purposes.  Following that, I then went down and started my hemilaminectomy in usual fashion.  We brought the microscope in, removed the ligamentum flavum carefully in order to avoid any injury to the dura and nerve root.  At this particular time, we cauterized the lateral recess veins.  She had a significant lateral recess stenosis, so we went out far laterally to decompress the recess, cauterized lateral recess veins with a bipolar.  We then went up proximally and did a foraminotomy and identified the 4 root and then went distally identified the  5 root. The 4 root was quite swollen.  The foramina was tight.  At this point, we then took another x-ray to verify the position.  I then made a cruciate incision in posterior longitudinal ligament.  I went out far laterally in the foramen and did a microdiskectomy in usual fashion utilizing the microscope.  Multiple passes were made into the disk space.  We removed several pieces of herniated disk material out into the foramen.  Once we had the 4 root clear, we then went down distally, did a foraminotomy and cleared the 5 root as well.  We then utilized a hockey-stick to make sure there were no other loose fragments of disk. Both roots now were free.  The dura was free.  I thoroughly irrigated out the area.  Then loosely applied some thrombin-soaked Gelfoam and closed the wound layers in usual fashion.  Note an x-ray was taken with an instrument in the disk space at 4-5 and it was labeled.  I then left a small distal deep and proximal part of the wound open for drainage purposes.  Subcu was closed with 0 Vicryl, skin with metal staples. Sterile Neosporin dressing was applied.  Note at the beginning of procedure, I injected 30 mL of 0.25% Marcaine with epinephrine into the soft tissue to control the bleeding.  At the end of the procedure, I injected 20 mL of Exparel into the soft tissue.  The patient had 2 g of IV Ancef.  Note the patient left the operating room in satisfactory condition.          ______________________________ Georges Lynch [name], M.D.     RAG/MEDQ  D:  05/14/2012  T:  05/14/2012  Job:  [id number]

## 2012-05-14 NOTE — Interval H&P Note (Signed)
History and Physical Interval Note:  05/14/2012 8:12 AM  [name]  has presented today for surgery, with the diagnosis of herniated disc  The various methods of treatment have been discussed with the patient and family. After consideration of risks, benefits and other options for treatment, the patient has consented to  Procedure(s) (LRB) with comments: HEMI-MICRODISCECTOMY LUMBAR LAMINECTOMY LEVEL 1 (N/A) - Hemi Laminectomy Microdiscectomy L4 - L5 Central (X-Ray) as a surgical intervention .  The patient's history has been reviewed, patient examined, no change in status, stable for surgery.  I have reviewed the patient's chart and labs.  Questions were answered to the patient's satisfaction.     Minette Manders A

## 2012-05-14 NOTE — Progress Notes (Signed)
Subjective: Day of Surgery Procedure(s) (LRB): HEMI-MICRODISCECTOMY LUMBAR LAMINECTOMY LEVEL 1 (N/A) Patient reports pain as 3 on 0-10 scale. Pos-OP Visit ,she complains of Left Lower abdominal pain that she said has been present "For a Long Time". She was eating a Cheeseburger and Donzetta Sprung when I came up to visit her this evening. She has had a Hysterectomy in the past. Will do A CT Abdomen and Pelvis. Abdomen is soft with minimal tenderness in Left Lower Quadrant. Bowel Sounds are intact.She is afebrile.Good function in Left leg.   Objective: Vital signs in last 24 hours: Temp:  [97.8 F (36.6 C)-99.1 F (37.3 C)] 99 F (37.2 C) (12/18 1600) Pulse Rate:  [43-64] 49  (12/18 1600) Resp:  [8-18] 15  (12/18 1600) BP: (111-151)/(59-83) 112/64 mmHg (12/18 1600) SpO2:  [100 %] 100 % (12/18 1600)  Intake/Output from previous day:   Intake/Output this shift: Total I/O In: 1360 [P.O.:360; I.V.:1000] Out: 400 [Urine:350; Blood:50]   Basename 05/12/12 1135  HGB 13.1    Basename 05/12/12 1135  WBC 5.6  RBC 4.49  HCT 40.1  PLT 345    Basename 05/12/12 1135  NA 142  K 3.6  CL 102  CO2 31  BUN 13  CREATININE 0.79  GLUCOSE 75  CALCIUM 9.7    Basename 05/14/12 0645 05/12/12 1135  LABPT -- --  INR 0.86 0.89    Neurologically intact Dorsiflexion/Plantar flexion intact  Assessment/Plan: Day of Surgery Procedure(s) (LRB): HEMI-MICRODISCECTOMY LUMBAR LAMINECTOMY LEVEL 1 (N/A) Up with therapy CT of Abdomen and Pelvis.  Tron Flythe A 05/14/2012, 5:44 PM

## 2012-05-14 NOTE — H&P (View-Only) (Signed)
Abnormal PTT results routed to Dr, [name] via [facility]

## 2012-05-14 NOTE — Anesthesia Preprocedure Evaluation (Signed)
Anesthesia Evaluation  Patient identified by MRN, date of birth, ID band Patient awake    Reviewed: Allergy & Precautions, H&P , NPO status , Patient's Chart, lab work & pertinent test results  Airway Mallampati: II TM Distance: <3 FB Neck ROM: Full    Dental No notable dental hx.    Pulmonary neg pulmonary ROS,  breath sounds clear to auscultation  Pulmonary exam normal       Cardiovascular hypertension, Pt. on medications Rhythm:Regular Rate:Normal     Neuro/Psych Anxiety negative neurological ROS     GI/Hepatic negative GI ROS, Neg liver ROS,   Endo/Other  negative endocrine ROS  Renal/GU negative Renal ROS  negative genitourinary   Musculoskeletal negative musculoskeletal ROS (+)   Abdominal   Peds negative pediatric ROS (+)  Hematology negative hematology ROS (+)   Anesthesia Other Findings   Reproductive/Obstetrics negative OB ROS                           Anesthesia Physical Anesthesia Plan  ASA: II  Anesthesia Plan: General   Post-op Pain Management:    Induction: Intravenous  Airway Management Planned: Oral ETT  Additional Equipment:   Intra-op Plan:   Post-operative Plan: Extubation in OR  Informed Consent: I have reviewed the patients History and Physical, chart, labs and discussed the procedure including the risks, benefits and alternatives for the proposed anesthesia with the patient or authorized representative who has indicated his/her understanding and acceptance.   Dental advisory given  Plan Discussed with: CRNA and Surgeon  Anesthesia Plan Comments:         Anesthesia Quick Evaluation

## 2012-05-14 NOTE — Transfer of Care (Signed)
Immediate Anesthesia Transfer of Care Note  Patient: [name]  Procedure(s) Performed: Procedure(s) (LRB): HEMI-MICRODISCECTOMY LUMBAR LAMINECTOMY LEVEL 1 (N/A)  Patient Location: PACU  Anesthesia Type: General  Level of Consciousness: sedated, patient cooperative and responds to stimulaton  Airway & Oxygen Therapy: Patient Spontanous Breathing and Patient connected to face mask oxgen  Post-op Assessment: Report given to PACU RN and Post -op Vital signs reviewed and stable  Post vital signs: Reviewed and stable  Complications: No apparent anesthesia complications

## 2012-05-14 NOTE — Anesthesia Procedure Notes (Signed)
Procedure Name: Intubation Date/Time: 05/14/2012 8:30 AM Performed by: Paris Lore Pre-anesthesia Checklist: Patient identified, Emergency Drugs available, Suction available and Patient being monitored Patient Re-evaluated:Patient Re-evaluated prior to inductionOxygen Delivery Method: Circle system utilized Preoxygenation: Pre-oxygenation with 100% oxygen Intubation Type: IV induction Ventilation: Mask ventilation without difficulty Laryngoscope Size: Miller and 2 Grade View: Grade I Tube type: Oral Tube size: 7.5 mm Number of attempts: 1 Airway Equipment and Method: Stylet Placement Confirmation: ETT inserted through vocal cords under direct vision,  positive ETCO2 and breath sounds checked- equal and bilateral Secured at: 21 cm Tube secured with: Tape Dental Injury: Teeth and Oropharynx as per pre-operative assessment

## 2012-05-14 NOTE — Brief Op Note (Signed)
05/14/2012  10:31 AM  PATIENT:  [name]  56 y.o. female  PRE-OPERATIVE DIAGNOSIS:  herniated disc Lumbar with Spinal Stenosis  POST-OPERATIVE DIAGNOSIS:  herniated disc Lumbar with Spinal Stenosis  PROCEDURE:  Procedure(s) (LRB) with comments: HEMI-MICRODISCECTOMY LUMBAR LAMINECTOMY LEVEL 1 (N/A) - Hemi Laminectomy Microdiscectomy L4 - L5 Central (X-Ray) and decompressive Lumbar Laminectomy for Spinal Stenosis.  SURGEON:  Surgeon(s) and Role:    * Jacki Cones, MD - Primary    * Javier Docker, MD - Assisting    ASSISTANTS: Paula Libra MD ANESTHESIA:   general  EBL:  Total I/O In: 700 [I.V.:700] Out: 150 [Urine:100; Blood:50]  BLOOD ADMINISTERED:none  DRAINS: none   LOCAL MEDICATIONS USED:  MARCAINE 30cc of 0.25% with Epinephrine and later,at end of case 20cc of Exparel  SPECIMEN:  Source of Specimen:  L-4-L-5 Disc space.  DISPOSITION OF SPECIMEN:  PATHOLOGY  COUNTS:  YES  TOURNIQUET:  * No tourniquets in log *  DICTATION: .Other Dictation: Dictation Number [id number]  PLAN OF CARE: Admit for overnight observation  PATIENT DISPOSITION:  PACU - hemodynamically stable.   Delay start of Pharmacological VTE agent (>24hrs) due to surgical blood loss or risk of bleeding: yes

## 2012-05-14 NOTE — Progress Notes (Signed)
PACU C/O pain LLQ, sharp. Has had this pain at home. No precipitating factors. Meds given.

## 2012-05-15 ENCOUNTER — Ambulatory Visit (HOSPITAL_COMMUNITY): Payer: BC Managed Care – PPO

## 2012-05-15 ENCOUNTER — Observation Stay (HOSPITAL_COMMUNITY): Payer: BC Managed Care – PPO

## 2012-05-15 ENCOUNTER — Encounter (HOSPITAL_COMMUNITY): Payer: Self-pay | Admitting: Orthopedic Surgery

## 2012-05-15 DIAGNOSIS — R109 Unspecified abdominal pain: Secondary | ICD-10-CM

## 2012-05-15 LAB — CBC WITH DIFFERENTIAL/PLATELET
Basophils Absolute: 0 10*3/uL (ref 0.0–0.1)
Basophils Relative: 0 % (ref 0–1)
Hemoglobin: 10.7 g/dL — ABNORMAL LOW (ref 12.0–15.0)
MCHC: 32.7 g/dL (ref 30.0–36.0)
Monocytes Relative: 7 % (ref 3–12)
Neutro Abs: 6.4 10*3/uL (ref 1.7–7.7)
Neutrophils Relative %: 72 % (ref 43–77)
Platelets: 269 10*3/uL (ref 150–400)
RDW: 13.8 % (ref 11.5–15.5)

## 2012-05-15 MED ORDER — FLEET ENEMA 7-19 GM/118ML RE ENEM: 1.0000 | ENEMA | Freq: Every day | RECTAL | Status: DC | PRN

## 2012-05-15 MED ORDER — IOHEXOL 300 MG/ML  SOLN: 100.0000 mL | Freq: Once | INTRAMUSCULAR | Status: AC | PRN

## 2012-05-15 NOTE — Progress Notes (Signed)
Subjective: Continues to complain of Left lower Abdominal pain SINCE 1997!!!!. CT of abdomen and Pelvis was Non-Remarkable. She was also evaluated by General Surgery. Will do CT of Left Hip. Her Labs were also Normal.   Objective: Vital signs in last 24 hours: Temp:  [98 F (36.7 C)-99.1 F (37.3 C)] 98.9 F (37.2 C) (12/19 1020) Pulse Rate:  [49-71] 60  (12/19 1020) Resp:  [15-16] 16  (12/19 1020) BP: (105-133)/(61-74) 121/69 mmHg (12/19 1020) SpO2:  [93 %-100 %] 96 % (12/19 1020) Weight:  [67.586 kg (149 lb)] 67.586 kg (149 lb) (12/18 1840)  Intake/Output from previous day: 12/18 0701 - 12/19 0700 In: 3136.7 [P.O.:360; I.V.:2776.7] Out: 1300 [Urine:1250; Blood:50] Intake/Output this shift: Total I/O In: 360 [P.O.:360] Out: 900 [Urine:900]   Basename 05/15/12 0447  HGB 10.7*    Basename 05/15/12 0447  WBC 8.8  RBC 3.69*  HCT 32.7*  PLT 269    Basename 05/14/12 1819  NA 139  K 3.4*  CL 101  CO2 31  BUN 9  CREATININE 0.77  GLUCOSE 94  CALCIUM 9.2    Basename 05/14/12 0645  LABPT --  INR 0.86    ABD soft Dorsiflexion/Plantar flexion intact  Assessment/Plan: CT of Left Hip and home tomorrow.   [name] A 05/15/2012, 2:03 PM

## 2012-05-15 NOTE — Progress Notes (Signed)
Pt is noncompliant with getting up out of bed without one of [place] being in there with her. I have educated patient multiple times about why it is imperative she call and wait for [place] if she needs to get out of bed. Bed alarm as been put on. Will continue to monitor.

## 2012-05-15 NOTE — Progress Notes (Signed)
Pt in room stating "I don't want any thing else for pain until I find out where the pain is coming from". Pt received Dilaudid 1mg  at 0510.

## 2012-05-15 NOTE — Progress Notes (Signed)
Pt having complaints of pain in her left groin area that shoots down her leg. Pt states she has been having this pain for years and thought it was coming for her back. Pt states the only thing that helps her pain is a heating pad like the one she uses at home.  Kenard Gower, PA called and order received for K pad. Explained to pt that a CT of her abdomen would be done tomorrow to evaluate her pain. Pt verbalizes understanding.

## 2012-05-15 NOTE — Consult Note (Signed)
Reason for Consult:  Abdominal pain Referring Physician: Annalee Meyerhoff is an 56 y.o. female.  HPI: 65 y/0 female s/p HEMI-MICRODISCECTOMY LUMBAR LAMINECTOMY LEVEL 1 yesterday, who was seen this AM by ortho, doing well from her back, but complaining of abdominal pain.  CT scan obtained by ortho Service shows changes consistent with her surgery, but no other abnormalities aside from diverticulosis., but no diverticulitis.  CBC is normal.  On Exam today she is complaining of pain Left groin going down her left leg.  She said this and the back pain was present prior to admission.  The groin and leg pain are not new. She was able to eat fried chicken for lunch, she did note she has not had a BM for 2 day.   Past Medical History  Diagnosis Date  . Hypertension   . Arthritis   . Chronic back pain   . Chest pain 2006  . Anxiety     Past Surgical History  Procedure Date  . Abdominal hysterectomy 1997  . Cardiac catheterization 2006  . Ingrown toenail removal   . Tonsillectomy     History reviewed. No pertinent family history.  Social History:  reports that she has been smoking Cigarettes.  She has a 30 pack-year smoking history. She has never used smokeless tobacco. She reports that she does not drink alcohol or use illicit drugs.  Allergies: No Known Allergies  Medications:  Prior to Admission:  Prescriptions prior to admission  Medication Sig Dispense Refill  . diazepam (VALIUM) 5 MG tablet Take 5 mg by mouth every 8 (eight) hours as needed. For anxiety      . enalapril-hydrochlorothiazide (VASERETIC) 10-25 MG per tablet Take 1 tablet by mouth daily before breakfast.       . oxyCODONE-acetaminophen (PERCOCET) 10-325 MG per tablet Take 1 tablet by mouth every 8 (eight) hours as needed. For pain      . zolpidem (AMBIEN) 10 MG tablet Take 10 mg by mouth at bedtime as needed. For sleep       Scheduled:   . enalapril  10 mg Oral Daily  . hydrochlorothiazide  25 mg Oral Daily    Continuous:   . lactated ringers    . lactated ringers 100 mL/hr at 05/15/12 0959   WJX:BJYNWGNFA, diazepam, HYDROcodone-acetaminophen, HYDROmorphone (DILAUDID) injection, iohexol, menthol-cetylpyridinium, methocarbamol (ROBAXIN) IV, methocarbamol, ondansetron (ZOFRAN) IV, oxyCODONE, oxyCODONE-acetaminophen, phenol, polyethylene glycol, zolpidem Anti-infectives     Start     Dose/Rate Route Frequency Ordered Stop   05/14/12 1400   ceFAZolin (ANCEF) IVPB 1 g/50 mL premix        1 g 100 mL/hr over 30 Minutes Intravenous 3 times per day 05/14/12 1306 05/15/12 0632   05/14/12 0914   polymyxin B 500,000 Units, bacitracin 50,000 Units in sodium chloride irrigation 0.9 % 500 mL irrigation  Status:  Discontinued          As needed 05/14/12 0914 05/14/12 1015   05/14/12 0625   ceFAZolin (ANCEF) IVPB 2 g/50 mL premix        2 g 100 mL/hr over 30 Minutes Intravenous 60 min pre-op 05/14/12 2130 05/14/12 0842          Results for orders placed during the hospital encounter of 05/14/12 (from the past 48 hour(s))  PROTIME-INR     Status: Normal   Collection Time   05/14/12  6:45 AM      Component Value Range Comment   Prothrombin Time 11.7  11.6 -  15.2 seconds    INR 0.86  0.00 - 1.49   COMPREHENSIVE METABOLIC PANEL     Status: Abnormal   Collection Time   05/14/12  6:19 PM      Component Value Range Comment   Sodium 139  135 - 145 mEq/L    Potassium 3.4 (*) 3.5 - 5.1 mEq/L    Chloride 101  96 - 112 mEq/L    CO2 31  19 - 32 mEq/L    Glucose, Bld 94  70 - 99 mg/dL    BUN 9  6 - 23 mg/dL    Creatinine, Ser 1.19  0.50 - 1.10 mg/dL    Calcium 9.2  8.4 - 14.7 mg/dL    Total Protein 6.5  6.0 - 8.3 g/dL    Albumin 3.4 (*) 3.5 - 5.2 g/dL    AST 19  0 - 37 U/L    ALT 9  0 - 35 U/L    Alkaline Phosphatase 60  39 - 117 U/L    Total Bilirubin 0.3  0.3 - 1.2 mg/dL    GFR calc non Af Amer >90  >90 mL/min    GFR calc Af Amer >90  >90 mL/min   CBC WITH DIFFERENTIAL     Status: Abnormal    Collection Time   05/15/12  4:47 AM      Component Value Range Comment   WBC 8.8  4.0 - 10.5 K/uL    RBC 3.69 (*) 3.87 - 5.11 MIL/uL    Hemoglobin 10.7 (*) 12.0 - 15.0 g/dL    HCT 82.9 (*) 56.2 - 46.0 %    MCV 88.6  78.0 - 100.0 fL    MCH 29.0  26.0 - 34.0 pg    MCHC 32.7  30.0 - 36.0 g/dL    RDW 13.0  86.5 - 78.4 %    Platelets 269  150 - 400 K/uL    Neutrophils Relative 72  43 - 77 %    Neutro Abs 6.4  1.7 - 7.7 K/uL    Lymphocytes Relative 20  12 - 46 %    Lymphs Abs 1.8  0.7 - 4.0 K/uL    Monocytes Relative 7  3 - 12 %    Monocytes Absolute 0.6  0.1 - 1.0 K/uL    Eosinophils Relative 0  0 - 5 %    Eosinophils Absolute 0.0  0.0 - 0.7 K/uL    Basophils Relative 0  0 - 1 %    Basophils Absolute 0.0  0.0 - 0.1 K/uL     Ct Abdomen Pelvis W Contrast  05/15/2012  *RADIOLOGY REPORT*  Clinical Data: Abdominal pain extending to left groin.  CT ABDOMEN AND PELVIS WITH CONTRAST  Technique:  Multidetector CT imaging of the abdomen and pelvis was performed following the standard protocol during bolus administration of intravenous contrast.  Contrast:  100 ml Omnipaque-300 and oral contrast  Comparison: Abdomen CTA on 06/16/2008  Findings: Several tiny hepatic cysts are noted but no liver masses are identified.  Gallbladder is unremarkable.  The spleen, pancreas, adrenal glands, and kidneys are normal in appearance.  No evidence of hydronephrosis.  Prior hysterectomy noted.  Bladder is mildly distended but otherwise unremarkable in appearance.  No soft tissue masses or lymphadenopathy identified within the abdomen or pelvis.  No evidence of inflammatory process or abnormal fluid collections.  No evidence of bowel wall thickening, dilatation, or hernia. Diverticulosis is seen, however there is no evidence of diverticulitis  Skin  staples are seen posteriorly from recent left L4 lumbar laminectomy.  Mild soft tissue stranding and gas is noted within the posterior subcutaneous tissues, but no drainable  fluid collection identified.  IMPRESSION:  1.  Recent postop changes from lumbar laminectomy.  Soft tissue stranding and gas noted in the posterior subcutaneous tissues, but no drainable fluid collection identified. 2.  No other acute findings identified. 3.  Diverticulosis.  No radiographic evidence of diverticulitis.   Original Report Authenticated By: Myles Rosenthal, M.D.    Dg Spine Portable 1 View  05/14/2012  *RADIOLOGY REPORT*  Clinical Data: The  The surgical decompression.  PORTABLE SPINE - 1 VIEW  Comparison: 05/14/2012, earlier the same day  Findings: Cross-table lateral portable view lumbar spine obtained at 0946 hours is labeled #4.  Soft tissue retractors are again seen in the posterior lower back.  A surgical probe overlying the operative site has the tip positioned at the level of the L4 inferior endplate.  IMPRESSION: Intraoperative localization.   Original Report Authenticated By: Kennith Center, M.D.    Dg Spine Portable 1 View  05/14/2012  *RADIOLOGY REPORT*  Clinical Data: Herniated disc  PORTABLE SPINE - 1 VIEW  Comparison: 05/12/2012  Findings: This lateral lumbar intraoperative radiograph marked #3 shows surgical instruments projecting over the L4 and L5 spinous processes with two probes projecting inferior to the L4 pedicles.  IMPRESSION:  Intraoperative localization   Original Report Authenticated By: D. Andria Rhein, MD    Dg Spine Portable 1 View  05/14/2012  *RADIOLOGY REPORT*  Clinical Data: Herniated disc  PORTABLE SPINE - 1 VIEW  Comparison: 05/12/2012  Findings: This lateral intraoperative lumbar radiograph shows surgical instruments projecting over the L4 and L5 spinous processes with a probe projecting between the two.  IMPRESSION:  1.  Intraoperative localization   Original Report Authenticated By: D. Andria Rhein, MD    Dg Spine Portable 1 View  05/14/2012  *RADIOLOGY REPORT*  Clinical Data: Microdiskectomy  PORTABLE SPINE - 1 VIEW  Comparison: 05/12/2012  Findings: This  single lateral intraoperative lumbar radiograph shows needles from posterior approach, tips at the  L3 and L4 spinous processes.  IMPRESSION:  Intraoperative localization   Original Report Authenticated By: D. Deanne Coffer III, MD     ROS Blood pressure 121/69, pulse 60, temperature 98.9 F (37.2 C), temperature source Oral, resp. rate 16, height 5\' 1"  (1.549 m), weight 149 lb (67.586 kg), SpO2 96.00%. Physical Exam  Constitutional: She is oriented to person, place, and time. She appears well-developed and well-nourished.       She is complaining loudly, of pain left groin to her lower leg on the left.  HENT:  Head: Normocephalic and atraumatic.  Nose: Nose normal.  Eyes: Conjunctivae normal and EOM are normal. Pupils are equal, round, and reactive to light. Right eye exhibits no discharge. Left eye exhibits no discharge. No scleral icterus.  Neck: Normal range of motion. Neck supple. No JVD present. No tracheal deviation present. No thyromegaly present.  Cardiovascular: Normal rate, regular rhythm, normal heart sounds and intact distal pulses.  Exam reveals no gallop.   No murmur heard. Respiratory: Effort normal and breath sounds normal. No respiratory distress. She has no wheezes. She has no rales. She exhibits no tenderness.  GI: Soft. Bowel sounds are normal. She exhibits no distension and no mass. There is no tenderness. There is no rebound and no guarding.       She points to left groin above the femoral artery as source of  pain and says it goes down her left leg and she points to her medial thigh as area of pain.  Musculoskeletal: Normal range of motion.       She is walking with a walker in the room and the hall way but she's not very dependant on walker.  Lymphadenopathy:    She has no cervical adenopathy.  Neurological: She is alert and oriented to person, place, and time. No cranial nerve deficit.  Skin: Skin is warm and dry. No rash noted. No erythema. No pallor.  Psychiatric: She  has a normal mood and affect. Judgment normal.       She is very loud and anxious over her ongoing groin pain.    Assessment/Plan: 1.s/p HEMI-MICRODISCECTOMY LUMBAR LAMINECTOMY LEVEL1 with ongoing left groin pain radiating to her left leg.  She says this is not new.  CT scan shows no real abnormalities.  2. Chronic back pain 3. Hypertension 4.Anxiety disorder 5.BMI 28 6. Hx of sleep apnea  Plan:  I would treat her constipation, but she denies any nausea, vomiting, she is taking a regular diet, nothing abnormal aside from some diverticulosis, without diverticulitis. I will review CT with Dr. Maisie Fus and she will see after surgery today. Will Marlyne Beards PA-C for Dr. Romie Levee.  [name] 05/15/2012, 1:04 PM

## 2012-05-15 NOTE — Evaluation (Signed)
Occupational Therapy Evaluation Patient Details Name: [name] MRN: [id number] DOB: [birth year] Today's Date: 05/15/2012 Time: 1478-2956 OT Time Calculation (min): 16 min  OT Assessment / Plan / Recommendation Clinical Impression  This 56 year old female was admitted for L4-5 decompression. At baseline, she lives alone with a dog and is modified independent with adls.  She has a h/o partial foot drop on L.  Pt now requires up to max A for LB adls.  She is appropriate for skilled OT with min A level goals in acute.      OT Assessment       Follow Up Recommendations  CIR (vs snf vs. hhot with 24/7 depending upon progress)    Barriers to Discharge      Equipment Recommendations  3 in 1 bedside comode (HHOT to assess for tub bench)    Recommendations for Other Services    Frequency       Precautions / Restrictions Precautions Precautions: Fall;Back Precaution Booklet Issued: Yes (comment) Restrictions Weight Bearing Restrictions: No   Pertinent Vitals/Pain Pain in groin--not rated    ADL  Grooming: Wash/dry hands;Min guard Where Assessed - Grooming: Supported standing Upper Body Bathing: Supervision/safety Where Assessed - Upper Body Bathing: Unsupported sitting Lower Body Bathing: Moderate assistance Where Assessed - Lower Body Bathing: Supported sit to stand Upper Body Dressing: Minimal assistance Where Assessed - Upper Body Dressing: Unsupported sitting Lower Body Dressing: Maximal assistance Where Assessed - Lower Body Dressing: Supported sit to stand Toilet Transfer: +2 Total assistance (for safety with IV) Toilet Transfer: Patient Percentage: 70% Toilet Transfer Method: Sit to Barista: Comfort height toilet Toileting - Clothing Manipulation and Hygiene: Minimal assistance Where Assessed - Toileting Clothing Manipulation and Hygiene: Sit to stand from 3-in-1 or toilet Transfers/Ambulation Related to ADLs: ambulated to bathroom.  Pt crossed  legs--narrow BOS, had difficulty with sequencing with RW.  Cues for back precautions ADL Comments: Pt has difficulty following back precautions:  tends to move unpredictably.  Will need rehab facility or 24/7 assistance.    OT Diagnosis:    OT Problem List:   OT Treatment Interventions:     OT Goals Acute Rehab OT Goals OT Goal Formulation: With patient Time For Goal Achievement: 05/22/12 Potential to Achieve Goals: Good ADL Goals Pt Will Perform Lower Body Bathing: with supervision;Sit to stand from chair;with adaptive equipment;with cueing (comment type and amount) (min cues) ADL Goal: Lower Body Bathing - Progress: Goal set today Pt Will Perform Lower Body Dressing: with min assist;Sit to stand from chair;with cueing (comment type and amount);with adaptive equipment (mod cues) ADL Goal: Lower Body Dressing - Progress: Goal set today Pt Will Transfer to Toilet: with min assist;Ambulation;3-in-1;Maintaining back safety precautions;with cueing (comment type and amount) (mod cues) ADL Goal: Toilet Transfer - Progress: Goal set today Pt Will Perform Toileting - Hygiene: with supervision;Standing at 3-in-1/toilet;with adaptive equipment;with cueing (comment type and amount) (min cues, AE if needed) ADL Goal: Toileting - Hygiene - Progress: Goal set today Miscellaneous OT Goals Miscellaneous OT Goal #1: Pt will recall 3/3 back precautions and observe them with min cues for ADLs OT Goal: Miscellaneous Goal #1 - Progress: Goal set today  Visit Information  Last OT Received On: 05/15/12 Assistance Needed: +2    Subjective Data  Subjective: I have to go to the bathroom again Patient Stated Goal: home withe   Prior Functioning     Home Living Lives With: Alone Available Help at Discharge: Family (grandchildren) Home Access: Stairs to  enter Entrance Stairs-Number of Steps: 1 Home Layout: One level Bathroom Shower/Tub: Engineer, manufacturing systems: Standard Prior Function Level  of Independence: Independent Driving: No Communication Communication: No difficulties Dominant Hand: Right         Vision/Perception     Cognition  Overall Cognitive Status: Impaired (? medications) Area of Impairment: Attention;Following commands;Safety/judgement Arousal/Alertness: Awake/alert Orientation Level: Appears intact for tasks assessed Behavior During Session: [facility] for tasks performed Current Attention Level: Sustained Following Commands:  (difficulty following sequencing cues) Safety/Judgement: Decreased awareness of safety precautions;Decreased safety judgement for tasks assessed Cognition - Other Comments: poor safety awareness, requiring lots of cues for walker safety and  back precautions    Extremity/Trunk Assessment Right Upper Extremity Assessment RUE ROM/Strength/Tone: WFL for tasks assessed;Due to precautions Left Upper Extremity Assessment LUE ROM/Strength/Tone: WFL for tasks assessed;Due to precautions Right Lower Extremity Assessment RLE ROM/Strength/Tone: Deficits RLE Coordination: Deficits RLE Coordination Deficits: decreased coordination with gait Left Lower Extremity Assessment LLE Coordination: Deficits LLE Coordination Deficits: diminished coordination with gait     Mobility Bed Mobility Bed Mobility: Rolling Left;Left Sidelying to Sit Rolling Left: 4: Min assist Left Sidelying to Sit: 4: Min assist Details for Bed Mobility Assistance: cues for log roll and back precautions Transfers Sit to Stand: 4: Min assist;From bed Stand to Sit: 4: Min assist;To chair/3-in-1     Shoulder Instructions     Exercise     Balance Dynamic Standing Balance Dynamic Standing - Balance Support: Right upper extremity supported Dynamic Standing - Level of Assistance: 4: Min assist   End of Session OT - End of Session Activity Tolerance: Patient limited by pain Patient left: Other (comment) (assisted to w/c for ct scan)  GO Functional Assessment Tool  Used: clinical judgment Functional Limitation: Self care Self Care Current Status (Z6109): At least 60 percent but less than 80 percent impaired, limited or restricted Self Care Goal Status (U0454): At least 20 percent but less than 40 percent impaired, limited or restricted   [facility] 05/15/2012, 10:50 AM Marica Otter, OTR/L [phone] 05/15/2012

## 2012-05-15 NOTE — Progress Notes (Signed)
Rehab Admissions Coordinator Note:  Patient was screened by Trish Mage for appropriateness for an Inpatient Acute Rehab Consult.  At this time, we are recommending Skilled Nursing Facility or [facility] therapies if patient progresses well.  It is doubtful that insurance carrier would approve an inpatient rehab stay.    Trish Mage 05/15/2012, 10:51 AM  I can be reached at [phone].

## 2012-05-15 NOTE — Plan of Care (Signed)
Problem: Phase II Progression Outcomes Goal: Foley discontinued Outcome: Not Applicable Date Met:  05/15/12 No foley

## 2012-05-15 NOTE — Progress Notes (Signed)
   Subjective: 1 Day Post-Op Procedure(s) (LRB): HEMI-MICRODISCECTOMY LUMBAR LAMINECTOMY LEVEL 1 (N/A) Patient reports pain as moderate.   Patient seen in rounds without Dr. [name]. Patient is having problems with lower abdominal pain. She reports that she has a little low back soreness but her back feels better since surgery. She reports having significant abdominal pain that at times radiates into her anterior left thigh. He reports that she is voiding and having BM without difficulty and has not experienced abnormal urine or stool at home recently. She denies chest pain and shortness of breath.   Plan is to go Home after hospital stay.  Objective: Vital signs in last 24 hours: Temp:  [97.8 F (36.6 C)-99.1 F (37.3 C)] 99.1 F (37.3 C) (12/19 1610) Pulse Rate:  [43-71] 71  (12/19 0608) Resp:  [8-16] 16  (12/19 0608) BP: (105-151)/(59-83) 124/72 mmHg (12/19 0608) SpO2:  [93 %-100 %] 93 % (12/19 0608) Weight:  [67.586 kg (149 lb)] 67.586 kg (149 lb) (12/18 1840)  Intake/Output from previous day:  Intake/Output Summary (Last 24 hours) at 05/15/12 0745 Last data filed at 05/15/12 0602  Gross per 24 hour  Intake 3136.66 ml  Output   1300 ml  Net 1836.66 ml     Labs:  Basename 05/15/12 0447 05/12/12 1135  HGB 10.7* 13.1    Basename 05/15/12 0447 05/12/12 1135  WBC 8.8 5.6  RBC 3.69* 4.49  HCT 32.7* 40.1  PLT 269 345    Basename 05/14/12 1819 05/12/12 1135  NA 139 142  K 3.4* 3.6  CL 101 102  CO2 31 31  BUN 9 13  CREATININE 0.77 0.79  GLUCOSE 94 75  CALCIUM 9.2 9.7    Basename 05/14/12 0645 05/12/12 1135  LABPT -- --  INR 0.86 0.89    EXAM General - Patient is Alert and Oriented Extremity - Neurologically intact Dorsiflexion/Plantar flexion intact Dressing - dressing C/D/I Motor Function - intact, moving foot and toes well on exam.  Patient does not appear distended. She is tender is the left lower quadrant. No masses palpated.   Past Medical History    Diagnosis Date  . Hypertension   . Arthritis   . Chronic back pain   . Chest pain 2006  . 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   Weight as of this encounter: 149 lb(67.586 kg). Advance diet Plan for discharge tomorrow  Weight-Bearing as tolerated  In regards to her back she is doing very well. However her abdominal pain is her main concern at this time. We are getting a CT of her abdomen and pelvis today with contrast, which she was drinking during rounds this morning. She can walk with therapy today if she is comfortable enough in regards to her abdomen to do so.   [name] [name] 05/15/2012, 7:45 AM

## 2012-05-15 NOTE — Evaluation (Signed)
Physical Therapy Evaluation Patient Details Name: [name] MRN: [id number] DOB: 1955-06-16 Today's Date: 05/15/2012 Time: 4098-1191 PT Time Calculation (min): 27 min  PT Assessment / Plan / Recommendation Clinical Impression  s/p hemilaminectomy and presents with decreased safety awareness, coordination and increased abd pain with CT scan pending today; will benefit form PT to maximize independence for next venue of care    PT Assessment  Patient needs continued PT services    Follow Up Recommendations  SNF;CIR;Home health PT (depending on progress)    Does the patient have the potential to tolerate intense rehabilitation      Barriers to Discharge        Equipment Recommendations  Rolling walker with 5" wheels    Recommendations for Other Services     Frequency Min 6X/week    Precautions / Restrictions Precautions Precautions: Fall;Back Precaution Booklet Issued: Yes (comment)   Pertinent Vitals/Pain       Mobility  Bed Mobility Bed Mobility: Rolling Left;Left Sidelying to Sit Rolling Left: 4: Min assist Left Sidelying to Sit: 4: Min assist Details for Bed Mobility Assistance: cues for log roll and back precautions Transfers Transfers: Sit to Stand;Stand to Sit Sit to Stand: 4: Min assist;From bed Stand to Sit: 4: Min assist;To chair/3-in-1 Ambulation/Gait Ambulation/Gait Assistance: 4: Min assist;3: Mod assist Ambulation Distance (Feet): 80 Feet Assistive device: Rolling walker Ambulation/Gait Assistance Details: increased time, multi-modal cues for sequence, RW distance from self Gait Pattern: Scissoring General Gait Details: very  unsafe with gait and RW use; increased/unsafe step length bil; mild scissoring    Shoulder Instructions     Exercises     PT Diagnosis: Difficulty walking  PT Problem List: Decreased range of motion;Decreased activity tolerance;Decreased balance;Decreased mobility;Decreased coordination;Decreased safety awareness;Decreased  knowledge of use of DME;Decreased knowledge of precautions PT Treatment Interventions: DME instruction;Gait training;Stair training;Functional mobility training;Therapeutic activities;Therapeutic exercise;Balance training;Patient/family education   PT Goals Acute Rehab PT Goals PT Goal Formulation: With patient Time For Goal Achievement: 05/29/12 Potential to Achieve Goals: Good Pt will Roll Supine to Right Side: with supervision PT Goal: Rolling Supine to Right Side - Progress: Goal set today Pt will go Supine/Side to Sit: with supervision PT Goal: Supine/Side to Sit - Progress: Goal set today Pt will go Sit to Supine/Side: with supervision PT Goal: Sit to Supine/Side - Progress: Goal set today Pt will go Sit to Stand: with supervision PT Goal: Sit to Stand - Progress: Goal set today Pt will go Stand to Sit: with supervision PT Goal: Stand to Sit - Progress: Goal set today Pt will Ambulate: >150 feet;with supervision;with rolling walker PT Goal: Ambulate - Progress: Goal set today Pt will Go Up / Down Stairs: 1-2 stairs;with supervision;with least restrictive assistive device PT Goal: Up/Down Stairs - Progress: Goal set today  Visit Information  Last PT Received On: 05/15/12 Assistance Needed: +2    Subjective Data  Subjective: pt wiggling in bed Patient Stated Goal: home, get better   Prior Functioning  Home Living Lives With: Alone Available Help at Discharge: Family Home Access: Stairs to enter Secretary/administrator of Steps: 1 Home Layout: One level Prior Function Level of Independence: Independent Driving: No Communication Communication: No difficulties    Cognition  Overall Cognitive Status: Appears within functional limits for tasks assessed/performed Arousal/Alertness: Awake/alert Orientation Level: Appears intact for tasks assessed Behavior During Session: [facility] for tasks performed Cognition - Other Comments: poor safety awareness, requiring lots of cues for  walker safety and  back precautions  Extremity/Trunk Assessment Right Lower Extremity Assessment RLE ROM/Strength/Tone: Deficits RLE Coordination: Deficits RLE Coordination Deficits: decreased coordination with gait Left Lower Extremity Assessment LLE Coordination: Deficits LLE Coordination Deficits: diminished coordination with gait   Balance Dynamic Standing Balance Dynamic Standing - Balance Support: Right upper extremity supported Dynamic Standing - Level of Assistance: 4: Min assist  End of Session PT - End of Session Equipment Utilized During Treatment: Gait belt Activity Tolerance: Patient tolerated treatment well Patient left: in chair;with call bell/phone within reach Nurse Communication: Mobility status  GP Functional Assessment Tool Used: clinical judgement Functional Limitation: Mobility: Walking and moving around Mobility: Walking and Moving Around Current Status [phone]): At least 20 percent but less than 40 percent impaired, limited or restricted Mobility: Walking and Moving Around Goal Status [phone]): At least 1 percent but less than 20 percent impaired, limited or restricted   [facility] 05/15/2012, 10:33 AM

## 2012-05-15 NOTE — Consult Note (Signed)
ATTENDING ADDENDUM:  I reviewed patient's record, examined the patient, and formulated the following plan:  No signs of abd pathology that would account for pain

## 2012-05-16 MED ORDER — OXYCODONE HCL 5 MG PO TABS: 5.0000 mg | ORAL_TABLET | ORAL | Status: DC | PRN

## 2012-05-16 MED ORDER — METHOCARBAMOL 500 MG PO TABS: 500.0000 mg | ORAL_TABLET | Freq: Four times a day (QID) | ORAL | Status: DC | PRN

## 2012-05-16 NOTE — Progress Notes (Signed)
Discharge summary sent to payer through [employer]  

## 2012-05-16 NOTE — Progress Notes (Signed)
Physical Therapy Treatment Patient Details Name: [name] MRN: [id number] DOB: 1955-10-02 Today's Date: 05/16/2012 Time: 6578-4696 PT Time Calculation (min): 24 min  PT Assessment / Plan / Recommendation Comments on Treatment Session  Pt verbally states 3/3 back percutuions but requires 50% VC's during functional activity. Pt is impulsive and easily distracted. Pt instructed on safe walker use and re educated on her back percautions.    Follow Up Recommendations  Home health PT     Does the patient have the potential to tolerate intense rehabilitation     Barriers to Discharge        Equipment Recommendations  Rolling walker with 5" wheels    Recommendations for Other Services    Frequency Min 6X/week   Plan Discharge plan remains appropriate    Precautions / Restrictions Precautions Precautions: Back;Fall Precaution Comments: Pt verbally states 3/3 back percautions but practically does not comply (impulsive, preocuppied) Restrictions Weight Bearing Restrictions: No   Pertinent Vitals/Pain "right much" Back pain Not yet time for meds ICE packs applied    Mobility  Bed Mobility Bed Mobility: Not assessed Details for Bed Mobility Assistance: Pt OOB in recliner Transfers Transfers: Sit to Stand;Stand to Sit Sit to Stand: 5: Supervision;From chair/3-in-1 Stand to Sit: 5: Supervision;To chair/3-in-1 Details for Transfer Assistance: Pt impulsive, easily distarcted and does not comply to her back percautions completely. Pt requires MAX VC's to avoid twisting during turns and bending over when she tried to fix her sock. Ambulation/Gait Ambulation/Gait Assistance: 4: Min guard Ambulation Distance (Feet): 185 Feet Assistive device: Rolling walker Ambulation/Gait Assistance Details: increased time and 50% VC's on safety with turns and her back percautions. 50% VC's to decrease step length to increase safety. Gait Pattern: Step-through pattern     PT Goals                                                           progressing    Visit Information  Last PT Received On: 05/16/12 Assistance Needed: +1                   End of Session PT - End of Session Equipment Utilized During Treatment: Gait belt Activity Tolerance: Patient tolerated treatment well Patient left: in chair;with call bell/phone within reach (2 ICE packs to back)   Felecia Shelling  PTA [facility]  Acute  Rehab Pager     [phone]

## 2012-05-16 NOTE — Progress Notes (Signed)
   Subjective: 2 Days Post-Op Procedure(s) (LRB): HEMI-MICRODISCECTOMY LUMBAR LAMINECTOMY LEVEL 1 (N/A) Patient reports pain as moderate.   Patient seen in rounds with Dr. [name]. Patient is continuing to have left lower quadrant and groin pain. She had a normal CT of the hip and the abdomen yesterday. She was evaluated by general surgery and was cleared from their standpoint. She is doing well in regards to her back. She is no longer having posterior leg pain in the left leg since after surgery. She is voiding on her own. No complaints of shortness of breath and chest pain.    Objective: Vital signs in last 24 hours: Temp:  [98.9 F (37.2 C)-100.5 F (38.1 C)] 100.5 F (38.1 C) (12/20 0540) Pulse Rate:  [60-80] 62  (12/20 0540) Resp:  [16-18] 18  (12/20 0540) BP: (99-121)/(64-74) 99/64 mmHg (12/20 0540) SpO2:  [93 %-97 %] 97 % (12/20 0540)  Intake/Output from previous day:  Intake/Output Summary (Last 24 hours) at 05/16/12 0735 Last data filed at 05/16/12 0541  Gross per 24 hour  Intake 1636.67 ml  Output   1300 ml  Net 336.67 ml     Labs:  Basename 05/15/12 0447  HGB 10.7*    Basename 05/15/12 0447  WBC 8.8  RBC 3.69*  HCT 32.7*  PLT 269    Basename 05/14/12 1819  NA 139  K 3.4*  CL 101  CO2 31  BUN 9  CREATININE 0.77  GLUCOSE 94  CALCIUM 9.2    Basename 05/14/12 0645  LABPT --  INR 0.86    EXAM General - Patient is Alert and Oriented Extremity - Neurologically intact Neurovascular intact Dorsiflexion/Plantar flexion intact Dressing/Incision - clean, dry, no drainage Motor Function - intact, moving foot and toes well on exam.   Past Medical History  Diagnosis Date  . Hypertension   . Arthritis   . Chronic back pain   . Chest pain 2006  . Anxiety     Assessment/Plan: 2 Days Post-Op Procedure(s) (LRB): HEMI-MICRODISCECTOMY LUMBAR LAMINECTOMY LEVEL 1 (N/A) Active Problems:  Intervertebral disc disorder with myelopathy of lumbosacral  region  Estimated Body mass index is 28.15 kg/(m^2) as calculated from the following:   Height as of this encounter: 5\' 1" (1.549 m).   Weight as of this encounter: 149 lb(67.586 kg). Advance diet Up with therapy D/C IV fluids Discharge home  Weight-Bearing as tolerated   She has been thoroughly evaluated for her groin and abdominal pain. We are going to continue to watch this. She is going home today. She is much improved in regards to her back. We will see her in the office in 2 weeks. We will order a walker for her to use while ambulating at home.   Thaddius Manes [name] 05/16/2012, 7:35 AM

## 2012-05-16 NOTE — Progress Notes (Signed)
Pt verbalized understanding of discharge instructions. Pt assessment has not changed from am. Pt was given prescriptions and d/c forms. Patients son is at bedside.

## 2012-05-16 NOTE — Care Management Note (Signed)
    Page 1 of 2   05/16/2012     5:10:03 PM   CARE MANAGEMENT NOTE 05/16/2012  Patient:  [name], [name]   Account Number:  [id number]  Date Initiated:  05/16/2012  Documentation initiated by:  Colleen Can  Subjective/Objective Assessment:   dx herniated disc, spinal strenosis; hemi-micro dissectomy L4-L5     Action/Plan:   CM spoke with patient. Plans are for patient to return to her home in [place] where son and daughter-in-law will be caregivers   Anticipated DC Date:  05/16/2012   Anticipated DC Plan:  HOME W HOME HEALTH SERVICES  In-house referral  NA      DC Planning Services  CM consult      [facility] Choice  HOME HEALTH  DURABLE MEDICAL EQUIPMENT   Choice offered to / List presented to:  C-1 Patient   DME arranged  3-N-1  Levan Hurst      DME agency  [facility]     HH arranged  HH-2 PT  HH-3 OT      [facility] agency  [facility]   Status of service:  Completed, signed off Medicare Important Message given?   (If response is "NO", the following Medicare IM given date fields will be blank) Date Medicare IM given:   Date Additional Medicare IM given:    Discharge Disposition:  HOME W HOME HEALTH SERVICES  Per UR Regulation:    If discussed at Long Length of Stay Meetings, dates discussed:    Comments:  05/16/2012 Regina Ivins rn bsn ccm [phone] [facility] CAN PR0VIDE hh SERVICES WITH START DATE OF mONDAY 05/19/2012. HH ORDERS, OP NOTE, H&P, FACE SHEET FACXED TO [phone] WITH CONFIRMATION.

## 2012-05-16 NOTE — Progress Notes (Signed)
Occupational Therapy Treatment Patient Details Name: [name] MRN: [id number] DOB: 05-Jul-1955 Today's Date: 05/16/2012 Time: 6213-0865 OT Time Calculation (min): 23 min  OT Assessment / Plan / Recommendation Comments on Treatment Session      Follow Up Recommendations  Home health OT;Supervision/Assistance - 24 hour    Barriers to Discharge       Equipment Recommendations  3 in 1 bedside comode    Recommendations for Other Services    Frequency     Plan      Precautions / Restrictions Precautions Precautions: Fall;Back Restrictions Weight Bearing Restrictions: No   Pertinent Vitals/Pain Back sore    ADL  Lower Body Dressing: Minimal assistance (with AE:  pants, socks and shoes) Where Assessed - Lower Body Dressing: Supported sit to stand Toilet Transfer: Hydrographic surveyor Method: Sit to stand Transfers/Ambulation Related to ADLs: min guard ambulating.  Pt recalled 1/3 back precautions ADL Comments: reviewed adls and back precautions:  pt reports she got clothes on this am and was not able to observe back precautions without AE.  Educated on this.  Pt does not want to go anywhere for rehab.  She will see if granddaughters will stay--feel she needs 24/7 for safety.  Pt also plans to get an AE kit.  Gave pt 2nd precautions handout and reviewed this with her.      OT Diagnosis:    OT Problem List:   OT Treatment Interventions:     OT Goals ADL Goals Pt Will Perform Lower Body Dressing: with min assist;Sit to stand from chair;with cueing (comment type and amount);with adaptive equipment ADL Goal: Lower Body Dressing - Progress: Met Pt Will Transfer to Toilet: with min assist;Ambulation;3-in-1;Maintaining back safety precautions;with cueing (comment type and amount) ADL Goal: Toilet Transfer - Progress: Met Miscellaneous OT Goals Miscellaneous OT Goal #1: Pt will recall 3/3 back precautions and observe them with min cues for ADLs OT Goal: Miscellaneous Goal #1  - Progress: Progressing toward goals  Visit Information  Last OT Received On: 05/16/12 Assistance Needed: +1    Subjective Data      Prior Functioning       Cognition  Overall Cognitive Status:  (improving but still needs mod cues for back precautions) Arousal/Alertness: Awake/alert Orientation Level: Appears intact for tasks assessed Behavior During Session: [facility] for tasks performed Cognition - Other Comments: decreased recall of back precautions    Mobility  Shoulder Instructions Transfers Sit to Stand: 4: Min guard;From bed       Exercises      Balance     End of Session OT - End of Session Activity Tolerance: Patient tolerated treatment well Patient left: in bed;with call bell/phone within reach  GO     [facility] 05/16/2012, 9:13 AM Marica Otter, OTR/L [phone] 05/16/2012

## 2012-05-19 NOTE — Discharge Summary (Signed)
Physician Discharge Summary   Patient ID: [name] MRN: [id number] DOB/AGE: 1956-04-21 56 y.o.  Admit date: 05/14/2012 Discharge date: 05/16/2012  Primary Diagnosis:    Admission Diagnoses:  Past Medical History  Diagnosis Date  . Hypertension   . Arthritis   . Chronic back pain   . Chest pain 2006  . 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   Weight as of this encounter: 149 lb(67.586 kg).  Classification of overweight in adults according to BMI (WHO, 1998)   Procedure:  Procedure(s) (LRB): HEMI-MICRODISCECTOMY LUMBAR LAMINECTOMY LEVEL 1 (N/A)   Consults: None and general surgery  HPI: The patient is a 56 year old female who is scheduled for a lumbar hemilaminectomy and microdiscectomy L4-L5 on the left to be performed by Dr. Georges Lynch. Regina Hillock, MD at [facility] on Wednesday May 14, 2012. The patient has had low back pain radiating into the left lower extremity for the past several months. She does not recall a specific injury but has had low back pain for several years, for which she saw Dr. Ethelene Hal in 2007. She has been treated with a prednisone pak and pain medication which has not given her relief. CT myelogram show disc fragment   Laboratory Data: Admission on 05/14/2012, Discharged on 05/16/2012  Component Date Value Range Status  . Prothrombin Time 05/14/2012 11.7  11.6 - 15.2 seconds Final  . INR 05/14/2012 0.86  0.00 - 1.49 Final  . WBC 05/15/2012 8.8  4.0 - 10.5 K/uL Final  . RBC 05/15/2012 3.69* 3.87 - 5.11 MIL/uL Final  . Hemoglobin 05/15/2012 10.7* 12.0 - 15.0 g/dL Final  . HCT 78/29/5621 32.7* 36.0 - 46.0 % Final  . MCV 05/15/2012 88.6  78.0 - 100.0 fL Final  . MCH 05/15/2012 29.0  26.0 - 34.0  pg Final  . MCHC 05/15/2012 32.7  30.0 - 36.0 g/dL Final  . RDW 30/86/5784 13.8  11.5 - 15.5 % Final  . Platelets 05/15/2012 269  150 - 400 K/uL Final  . Neutrophils Relative 05/15/2012 72  43 - 77 % Final  . Neutro Abs 05/15/2012 6.4  1.7 - 7.7 K/uL Final  . Lymphocytes Relative 05/15/2012 20  12 - 46 % Final  . Lymphs Abs 05/15/2012 1.8  0.7 - 4.0 K/uL Final  . Monocytes Relative 05/15/2012 7  3 - 12 % Final  . Monocytes Absolute 05/15/2012 0.6  0.1 - 1.0 K/uL Final  . Eosinophils Relative 05/15/2012 0  0 - 5 % Final  . Eosinophils Absolute 05/15/2012 0.0  0.0 - 0.7 K/uL Final  . Basophils Relative 05/15/2012 0  0 - 1 % Final  . Basophils Absolute 05/15/2012 0.0  0.0 - 0.1 K/uL Final  . Sodium 05/14/2012 139  135 - 145 mEq/L Final  . Potassium 05/14/2012 3.4* 3.5 - 5.1 mEq/L Final  . Chloride 05/14/2012 101  96 - 112 mEq/L Final  . CO2 05/14/2012 31  19 - 32 mEq/L Final  . Glucose, Bld 05/14/2012 94  70 - 99 mg/dL Final  . BUN 69/62/9528 9  6 - 23 mg/dL Final  . Creatinine, Ser 05/14/2012 0.77  0.50 - 1.10 mg/dL Final  . Calcium 41/32/4401 9.2  8.4 - 10.5 mg/dL Final  . Total Protein 05/14/2012  6.5  6.0 - 8.3 g/dL Final  . Albumin 16/02/9603 3.4* 3.5 - 5.2 g/dL Final  . AST 54/01/8118 19  0 - 37 U/L Final  . ALT 05/14/2012 9  0 - 35 U/L Final  . Alkaline Phosphatase 05/14/2012 60  39 - 117 U/L Final  . Total Bilirubin 05/14/2012 0.3  0.3 - 1.2 mg/dL Final  . GFR calc non Af Amer 05/14/2012 >90  >90 mL/min Final  . GFR calc Af Amer 05/14/2012 >90  >90 mL/min Final   Comment:                                 The eGFR has been calculated                          using the CKD EPI equation.                          This calculation has not been                          validated in all clinical                          situations.                          eGFR's persistently                          <90 mL/min signify                          possible Chronic Kidney Disease.    Hospital Outpatient Visit on 05/12/2012  Component Date Value Range Status  . aPTT 05/12/2012 40* 24 - 37 seconds Final   Comment:                                 IF BASELINE aPTT IS ELEVATED,                          SUGGEST PATIENT RISK ASSESSMENT                          BE USED TO DETERMINE APPROPRIATE                          ANTICOAGULANT THERAPY.  . Sodium 05/12/2012 142  135 - 145 mEq/L Final  . Potassium 05/12/2012 3.6  3.5 - 5.1 mEq/L Final  . Chloride 05/12/2012 102  96 - 112 mEq/L Final  . CO2 05/12/2012 31  19 - 32 mEq/L Final  . Glucose, Bld 05/12/2012 75  70 - 99 mg/dL Final  . BUN 14/78/2956 13  6 - 23 mg/dL Final  . Creatinine, Ser 05/12/2012 0.79  0.50 - 1.10 mg/dL Final  . Calcium 21/30/8657 9.7  8.4 - 10.5 mg/dL Final  . Total Protein 05/12/2012 6.7  6.0 - 8.3 g/dL Final  . Albumin 84/69/6295 3.7  3.5 - 5.2 g/dL Final  . AST 28/41/3244 14  0 - 37 U/L  Final  . ALT 05/12/2012 6  0 - 35 U/L Final  . Alkaline Phosphatase 05/12/2012 62  39 - 117 U/L Final  . Total Bilirubin 05/12/2012 0.2* 0.3 - 1.2 mg/dL Final  . GFR calc non Af Amer 05/12/2012 >90  >90 mL/min Final  . GFR calc Af Amer 05/12/2012 >90  >90 mL/min Final   Comment:                                 The eGFR has been calculated                          using the CKD EPI equation.                          This calculation has not been                          validated in all clinical                          situations.                          eGFR's persistently                          <90 mL/min signify                          possible Chronic Kidney Disease.  Marland Kitchen Prothrombin Time 05/12/2012 12.0  11.6 - 15.2 seconds Final  . INR 05/12/2012 0.89  0.00 - 1.49 Final  . Color, Urine 05/12/2012 YELLOW  YELLOW Final  . APPearance 05/12/2012 CLEAR  CLEAR Final  . Specific Gravity, Urine 05/12/2012 1.011  1.005 - 1.030 Final  . pH 05/12/2012 5.0  5.0 - 8.0 Final  . Glucose, UA 05/12/2012 NEGATIVE   NEGATIVE mg/dL Final  . Hgb urine dipstick 05/12/2012 NEGATIVE  NEGATIVE Final  . Bilirubin Urine 05/12/2012 NEGATIVE  NEGATIVE Final  . Ketones, ur 05/12/2012 NEGATIVE  NEGATIVE mg/dL Final  . Protein, ur 62/13/0865 NEGATIVE  NEGATIVE mg/dL Final  . Urobilinogen, UA 05/12/2012 0.2  0.0 - 1.0 mg/dL Final  . Nitrite 78/46/9629 NEGATIVE  NEGATIVE Final  . Leukocytes, UA 05/12/2012 NEGATIVE  NEGATIVE Final   MICROSCOPIC NOT DONE ON URINES WITH NEGATIVE PROTEIN, BLOOD, LEUKOCYTES, NITRITE, OR GLUCOSE <1000 mg/dL.  . WBC 05/12/2012 5.6  4.0 - 10.5 K/uL Final  . RBC 05/12/2012 4.49  3.87 - 5.11 MIL/uL Final  . Hemoglobin 05/12/2012 13.1  12.0 - 15.0 g/dL Final  . HCT 52/84/1324 40.1  36.0 - 46.0 % Final  . MCV 05/12/2012 89.3  78.0 - 100.0 fL Final  . MCH 05/12/2012 29.2  26.0 - 34.0 pg Final  . MCHC 05/12/2012 32.7  30.0 - 36.0 g/dL Final  . RDW 40/02/2724 13.7  11.5 - 15.5 % Final  . Platelets 05/12/2012 345  150 - 400 K/uL Final  . Specimen Description 05/12/2012 NOSE   Final  . Special Requests 05/12/2012 NONE   Final  . Culture 05/12/2012 NOMRSA   Final  . Report Status 05/12/2012 05/14/2012 FINAL   Final     X-Rays:Dg Chest 2 View  05/12/2012  *RADIOLOGY REPORT*  Clinical Data: Preop for lumbar surgery.  CHEST - 2 VIEW  Comparison: One-view chest 06/27/2011.  Findings: The heart size is normal.  The lungs are clear.  Mild degenerative changes in the thoracic spine are stable.  IMPRESSION: No acute cardiopulmonary disease or significant interval change.   Original Report Authenticated By: Marin Roberts, M.D.    Dg Lumbar Spine 2-3 Views  05/12/2012  *RADIOLOGY REPORT*  Clinical Data: Cervical reference.  Preop for lumbar spine surgery.  LUMBAR SPINE - 2-3 VIEW  Comparison: Lumbar myelogram 02/14/2012.  Findings: Five non-rib bearing lumbar type vertebral bodies are present.  The vertebral bodies as right labeled in accordance with the recent myelogram.  This the vertebral body  heights and alignment are maintained.  The visualized pelvis is unremarkable.  IMPRESSION: Normal two-view lumbar radiographs.   Original Report Authenticated By: Marin Roberts, M.D.    Ct Abdomen Pelvis W Contrast  05/15/2012  *RADIOLOGY REPORT*  Clinical Data: Abdominal pain extending to left groin.  CT ABDOMEN AND PELVIS WITH CONTRAST  Technique:  Multidetector CT imaging of the abdomen and pelvis was performed following the standard protocol during bolus administration of intravenous contrast.  Contrast:  100 ml Omnipaque-300 and oral contrast  Comparison: Abdomen CTA on 06/16/2008  Findings: Several tiny hepatic cysts are noted but no liver masses are identified.  Gallbladder is unremarkable.  The spleen, pancreas, adrenal glands, and kidneys are normal in appearance.  No evidence of hydronephrosis.  Prior hysterectomy noted.  Bladder is mildly distended but otherwise unremarkable in appearance.  No soft tissue masses or lymphadenopathy identified within the abdomen or pelvis.  No evidence of inflammatory process or abnormal fluid collections.  No evidence of bowel wall thickening, dilatation, or hernia. Diverticulosis is seen, however there is no evidence of diverticulitis  Skin staples are seen posteriorly from recent left L4 lumbar laminectomy.  Mild soft tissue stranding and gas is noted within the posterior subcutaneous tissues, but no drainable fluid collection identified.  IMPRESSION:  1.  Recent postop changes from lumbar laminectomy.  Soft tissue stranding and gas noted in the posterior subcutaneous tissues, but no drainable fluid collection identified. 2.  No other acute findings identified. 3.  Diverticulosis.  No radiographic evidence of diverticulitis.   Original Report Authenticated By: Myles Rosenthal, M.D.    Ct Hip Left Wo Contrast  05/15/2012  *RADIOLOGY REPORT*  Clinical Data: Severe left hip pain.  CT OF THE LEFT HIP WITHOUT CONTRAST  Technique:  Multidetector CT imaging was  performed according to the standard protocol. Multiplanar CT image reconstructions were also generated.  Comparison: CT scans of the abdomen dated 05/15/2012 and 06/16/2008  Findings: There is no fracture or dislocation.  There are mild osteoarthritic changes of the left hip joint with slight marginal spurring of the femoral head and acetabulum with a small detached spur at the anterior superior aspect of the acetabulum.  There is a similar finding on the opposite hip.  There is no soft tissue abnormality.  No joint effusion or bursitis is appreciable.  Muscle structures appear normal.  IMPRESSION: Slight arthritic changes of the left hip joint.  No acute abnormality.   Original Report Authenticated By: Francene Boyers, M.D.    Dg Spine Portable 1 View  05/14/2012  *RADIOLOGY REPORT*  Clinical Data: The  The surgical decompression.  PORTABLE SPINE - 1 VIEW  Comparison: 05/14/2012, earlier the same day  Findings: Cross-table lateral portable view lumbar spine obtained at 0946 hours is  labeled #4.  Soft tissue retractors are again seen in the posterior lower back.  A surgical probe overlying the operative site has the tip positioned at the level of the L4 inferior endplate.  IMPRESSION: Intraoperative localization.   Original Report Authenticated By: Kennith Center, M.D.    Dg Spine Portable 1 View  05/14/2012  *RADIOLOGY REPORT*  Clinical Data: Herniated disc  PORTABLE SPINE - 1 VIEW  Comparison: 05/12/2012  Findings: This lateral lumbar intraoperative radiograph marked #3 shows surgical instruments projecting over the L4 and L5 spinous processes with two probes projecting inferior to the L4 pedicles.  IMPRESSION:  Intraoperative localization   Original Report Authenticated By: D. Andria Rhein, MD    Dg Spine Portable 1 View  05/14/2012  *RADIOLOGY REPORT*  Clinical Data: Herniated disc  PORTABLE SPINE - 1 VIEW  Comparison: 05/12/2012  Findings: This lateral intraoperative lumbar radiograph shows surgical  instruments projecting over the L4 and L5 spinous processes with a probe projecting between the two.  IMPRESSION:  1.  Intraoperative localization   Original Report Authenticated By: D. Andria Rhein, MD    Dg Spine Portable 1 View  05/14/2012  *RADIOLOGY REPORT*  Clinical Data: Microdiskectomy  PORTABLE SPINE - 1 VIEW  Comparison: 05/12/2012  Findings: This single lateral intraoperative lumbar radiograph shows needles from posterior approach, tips at the  L3 and L4 spinous processes.  IMPRESSION:  Intraoperative localization   Original Report Authenticated By: D. Andria Rhein, MD     EKG: Orders placed during the hospital encounter of 05/14/12  . EKG     Hospital Course: Halei Hanover is a 56 y.o. who was admitted to [facility]. They were brought to the operating room on 05/14/2012 and underwent Procedure(s): HEMI-MICRODISCECTOMY LUMBAR LAMINECTOMY LEVEL 1.  Patient tolerated the procedure well and was later transferred to the recovery room and then to the orthopaedic floor for postoperative care.  They were given PO and IV analgesics for pain control following their surgery.  They were given 24 hours of postoperative antibiotics of  Anti-infectives     Start     Dose/Rate Route Frequency Ordered Stop   05/14/12 1400   ceFAZolin (ANCEF) IVPB 1 g/50 mL premix        1 g 100 mL/hr over 30 Minutes Intravenous 3 times per day 05/14/12 1306 05/15/12 0632   05/14/12 0914   polymyxin B 500,000 Units, bacitracin 50,000 Units in sodium chloride irrigation 0.9 % 500 mL irrigation  Status:  Discontinued          As needed 05/14/12 0914 05/14/12 1015   05/14/12 0625   ceFAZolin (ANCEF) IVPB 2 g/50 mL premix        2 g 100 mL/hr over 30 Minutes Intravenous 60 min pre-op 05/14/12 1478 05/14/12 0842        PT was ordered for gait training and ambulation assistance.  Discharge planning consulted to help with postop disposition and equipment needs.  Patient had a rough night on the evening of  surgery but started to get up OOB with therapy on day one. She was complaining left lower quadrant and left groin pain. CT abdomen as well as CT left hip showed no abnormalities but diverticulosis and slightly arthritic left hip. Patient was doing very well in regards to the lower back and the posterior thigh pain. Patient was evaluated by general surgery who cleared her. Her pain was somewhat improved on post op day two.  Patient was seen in rounds and was  ready to go home.   Discharge Medications: Prior to Admission medications   Medication Sig Start Date End Date Taking? Authorizing Provider  diazepam (VALIUM) 5 MG tablet Take 5 mg by mouth every 8 (eight) hours as needed. For anxiety   Yes Historical Provider, MD  enalapril-hydrochlorothiazide (VASERETIC) 10-25 MG per tablet Take 1 tablet by mouth daily before breakfast.    Yes Historical Provider, MD  zolpidem (AMBIEN) 10 MG tablet Take 10 mg by mouth at bedtime as needed. For sleep   Yes Historical Provider, MD  methocarbamol (ROBAXIN) 500 MG tablet Take 1 tablet (500 mg total) by mouth every 6 (six) hours as needed. 05/16/12   Tiombe Tomeo Tamala Ser, PA  oxyCODONE (OXY IR/ROXICODONE) 5 MG immediate release tablet Take 1-3 tablets (5-15 mg total) by mouth every 4 (four) hours as needed. 05/16/12   Remijio Holleran Tamala Ser, PA    Diet: Cardiac diet Activity:WBAT Follow-up:in 2 weeks Disposition - Home Discharged Condition: fair   Discharge Orders    Future Orders Please Complete By Expires   Call MD / Call 911      Comments:   If you experience chest pain or shortness of breath, CALL 911 and be transported to the hospital emergency room.  If you develope a fever above 101 F, pus (white drainage) or increased drainage or redness at the wound, or calf pain, call your surgeon's office.   Increase activity slowly as tolerated      Discharge instructions      Comments:   Change your dressing daily. Shower only, no tub bath. Call if any  temperatures greater than 101 or any wound complications: [phone] during the day and ask for Dr. Jeannetta Ellis nurse, [facility]. Follow up in office in 2 weeks Walk with your walker   Driving restrictions      Comments:   No driving   Lifting restrictions      Comments:   No lifting       Medication List     As of 05/19/2012  1:17 PM    STOP taking these medications         oxyCODONE-acetaminophen 10-325 MG per tablet   Commonly known as: PERCOCET      TAKE these medications         diazepam 5 MG tablet   Commonly known as: VALIUM   Take 5 mg by mouth every 8 (eight) hours as needed. For anxiety      enalapril-hydrochlorothiazide 10-25 MG per tablet   Commonly known as: VASERETIC   Take 1 tablet by mouth daily before breakfast.      methocarbamol 500 MG tablet   Commonly known as: ROBAXIN   Take 1 tablet (500 mg total) by mouth every 6 (six) hours as needed.      oxyCODONE 5 MG immediate release tablet   Commonly known as: Oxy IR/ROXICODONE   Take 1-3 tablets (5-15 mg total) by mouth every 4 (four) hours as needed.      zolpidem 10 MG tablet   Commonly known as: AMBIEN   Take 10 mg by mouth at bedtime as needed. For sleep         Signed: Colleen Donahoe [name] 05/19/2012, 1:17 PM

## 2012-09-08 ENCOUNTER — Encounter (HOSPITAL_COMMUNITY): Payer: Self-pay | Admitting: *Deleted

## 2012-09-08 ENCOUNTER — Emergency Department (HOSPITAL_COMMUNITY)
Admission: EM | Admit: 2012-09-08 | Discharge: 2012-09-08 | Discharge status: Against Medical Advice | Payer: BC Managed Care – PPO | Attending: Emergency Medicine | Admitting: Emergency Medicine

## 2012-09-08 DIAGNOSIS — F17200 Nicotine dependence, unspecified, uncomplicated: Secondary | ICD-10-CM | POA: Insufficient documentation

## 2012-09-08 DIAGNOSIS — K089 Disorder of teeth and supporting structures, unspecified: Secondary | ICD-10-CM | POA: Insufficient documentation

## 2012-09-08 DIAGNOSIS — K0889 Other specified disorders of teeth and supporting structures: Secondary | ICD-10-CM

## 2012-09-08 DIAGNOSIS — I10 Essential (primary) hypertension: Secondary | ICD-10-CM | POA: Insufficient documentation

## 2012-09-08 NOTE — ED Notes (Signed)
Pt. States that her tooth started hurting this am around 1000. She took her usual percocet for her back pain and thought it would help it. She has used peroxide swishes and anbesol and they have not helped so she decided to come here tonight. She does not have a dentist because she does not have dental insurance. She wants an antibiotic and something for pain.

## 2012-09-08 NOTE — ED Notes (Signed)
Pt states that she has been having dental pain for today. Pt states that her tooth (with x-ray to prove) is in her gum and she has a hole in her bottom front tooth that has been hurting.

## 2012-09-28 ENCOUNTER — Encounter (HOSPITAL_COMMUNITY): Payer: Self-pay

## 2012-09-28 ENCOUNTER — Emergency Department (HOSPITAL_COMMUNITY): Payer: BC Managed Care – PPO

## 2012-09-28 ENCOUNTER — Emergency Department (HOSPITAL_COMMUNITY)
Admission: EM | Admit: 2012-09-28 | Discharge: 2012-09-28 | Disposition: A | Discharge status: Home / Self Care | Payer: BC Managed Care – PPO | Attending: Emergency Medicine | Admitting: Emergency Medicine

## 2012-09-28 DIAGNOSIS — F17200 Nicotine dependence, unspecified, uncomplicated: Secondary | ICD-10-CM | POA: Insufficient documentation

## 2012-09-28 DIAGNOSIS — Z8659 Personal history of other mental and behavioral disorders: Secondary | ICD-10-CM | POA: Insufficient documentation

## 2012-09-28 DIAGNOSIS — W1809XA Striking against other object with subsequent fall, initial encounter: Secondary | ICD-10-CM | POA: Insufficient documentation

## 2012-09-28 DIAGNOSIS — Y9389 Activity, other specified: Secondary | ICD-10-CM | POA: Insufficient documentation

## 2012-09-28 DIAGNOSIS — Z8739 Personal history of other diseases of the musculoskeletal system and connective tissue: Secondary | ICD-10-CM | POA: Insufficient documentation

## 2012-09-28 DIAGNOSIS — G8929 Other chronic pain: Secondary | ICD-10-CM | POA: Insufficient documentation

## 2012-09-28 DIAGNOSIS — S8010XA Contusion of unspecified lower leg, initial encounter: Secondary | ICD-10-CM | POA: Insufficient documentation

## 2012-09-28 DIAGNOSIS — Y9229 Other specified public building as the place of occurrence of the external cause: Secondary | ICD-10-CM | POA: Insufficient documentation

## 2012-09-28 DIAGNOSIS — S8012XA Contusion of left lower leg, initial encounter: Secondary | ICD-10-CM

## 2012-09-28 DIAGNOSIS — W010XXA Fall on same level from slipping, tripping and stumbling without subsequent striking against object, initial encounter: Secondary | ICD-10-CM | POA: Insufficient documentation

## 2012-09-28 DIAGNOSIS — I10 Essential (primary) hypertension: Secondary | ICD-10-CM | POA: Insufficient documentation

## 2012-09-28 DIAGNOSIS — Z79899 Other long term (current) drug therapy: Secondary | ICD-10-CM | POA: Insufficient documentation

## 2012-09-28 MED ORDER — HYDROCODONE-ACETAMINOPHEN 5-325 MG PO TABS: 1.0000 | ORAL_TABLET | Freq: Four times a day (QID) | ORAL | Status: DC | PRN

## 2012-09-28 MED ORDER — HYDROCODONE-ACETAMINOPHEN 5-325 MG PO TABS
1.0000 | ORAL_TABLET | Freq: Once | ORAL | Status: AC
  Administered 2012-09-28: 1 via ORAL
  Filled 2012-09-28: qty 1

## 2012-09-28 MED ORDER — IBUPROFEN 800 MG PO TABS: 800.0000 mg | ORAL_TABLET | Freq: Three times a day (TID) | ORAL | Status: DC | PRN

## 2012-09-28 NOTE — ED Notes (Signed)
Discharge instructions, medications, and follow up reviewed. Pt verbalized understanding.

## 2012-09-28 NOTE — ED Provider Notes (Signed)
Medical screening examination/treatment/procedure(s) were performed by non-physician practitioner and as supervising physician I was immediately available for consultation/collaboration.    Celene Kras, MD 09/28/12 [phone]

## 2012-09-28 NOTE — ED Notes (Signed)
Arrived [facility] from [facility]. Shopping. Tripped over shopping cart. Fell forward. Denies neck or back pain. C/O left lower leg pain. GCS 15.

## 2012-09-28 NOTE — ED Provider Notes (Signed)
History     CSN: [id number]  Arrival date & time 09/28/12  1013   First MD Initiated Contact with Patient 09/28/12 1022      Chief Complaint  Patient presents with  . Leg Pain  . Fall    (Consider location/radiation/quality/duration/timing/severity/associated sxs/prior treatment) HPI Patient presents to the emergency department with left lower leg pain.  The patient, [place] [employer] [place] she struck her leg against a cart.  Patient, states, that she's not had any other injury other than the middle part of her left lower leg.  Patient, states, that palpation and movement make her pain, worse.  Patient did not take any medications prior to arrival for her symptoms. Past Medical History  Diagnosis Date  . Hypertension   . Arthritis   . Chronic back pain   . Chest pain 2006  . Anxiety     Past Surgical History  Procedure Laterality Date  . Abdominal hysterectomy  1997  . Cardiac catheterization  2006  . Ingrown toenail removal    . Tonsillectomy    . Hemi-microdiscectomy lumbar laminectomy level 1  05/14/2012    Procedure: HEMI-MICRODISCECTOMY LUMBAR LAMINECTOMY LEVEL 1;  Surgeon: Jacki Cones, MD;  Location: [facility];  Service: Orthopedics;  Laterality: N/A;  Hemi Laminectomy Microdiscectomy L4 - L5 Central (X-Ray)    No family history on file.  History  Substance Use Topics  . Smoking status: Current Every Day Smoker -- 1.00 packs/day for 30 years    Types: Cigarettes  . Smokeless tobacco: Never Used  . Alcohol Use: No    OB History   Grav Para Term Preterm Abortions TAB SAB Ect Mult Living                  Review of Systems All other systems negative except as documented in the HPI. All pertinent positives and negatives as reviewed in the HPI.  Allergies  Review of patient's allergies indicates no known allergies.  Home Medications   Current Outpatient Rx  Name  Route  Sig  Dispense  Refill  . enalapril-hydrochlorothiazide (VASERETIC)  10-25 MG per tablet   Oral   Take 1 tablet by mouth daily before breakfast.          . oxyCODONE-acetaminophen (PERCOCET/ROXICET) 5-325 MG per tablet   Oral   Take 1 tablet by mouth every 4 (four) hours as needed for pain.         Marland Kitchen zolpidem (AMBIEN) 10 MG tablet   Oral   Take 10 mg by mouth at bedtime as needed. For sleep           BP 161/82  Pulse 69  Temp(Src) 98.3 F (36.8 C) (Oral)  SpO2 98%  Physical Exam  Nursing note and vitals reviewed. Constitutional: She appears well-developed and well-nourished. No distress.  HENT:  Head: Normocephalic and atraumatic.  Pulmonary/Chest: Effort normal.  Musculoskeletal:       Left lower leg: She exhibits tenderness and swelling. She exhibits no deformity and no laceration.       Legs: Skin: Skin is warm and dry.    ED Course  Procedures (including critical care time)  Labs Reviewed - No data to display Dg Tibia/fibula Left  09/28/2012  *RADIOLOGY REPORT*  Clinical Data: Fall, left lower leg pain  LEFT TIBIA AND FIBULA - 2 VIEW  Comparison: None.  Findings: Normal alignment without fracture.  Left tibia and fibula appear intact.  No soft tissue abnormality.  IMPRESSION: No acute finding   Original Report Authenticated By: Judie Petit. Miles Costain, M.D.     Patient is advised of her x-ray results, and she is advised to ice and elevate the leg.  Patient is advised to return here for any worsening in her condition.  Advised followup with her primary care Dr. for recheck.   MDM  MDM Reviewed: vitals and nursing note Interpretation: x-ray            Carlyle Dolly, PA-C 09/28/12 1205

## 2012-09-28 NOTE — ED Notes (Signed)
Pt in X ray

## 2013-01-15 ENCOUNTER — Inpatient Hospital Stay: Admit: 2013-01-15 | Payer: BC Managed Care – PPO | Admitting: Orthopedic Surgery

## 2013-01-15 SURGERY — ARTHROPLASTY, HIP, TOTAL,POSTERIOR APPROACH
Anesthesia: General | Site: Hip | Laterality: Left

## 2013-01-31 ENCOUNTER — Other Ambulatory Visit: Payer: Self-pay

## 2013-01-31 ENCOUNTER — Emergency Department (HOSPITAL_COMMUNITY)
Admission: EM | Admit: 2013-01-31 | Discharge: 2013-01-31 | Disposition: A | Discharge status: Home / Self Care | Payer: BC Managed Care – PPO | Attending: Emergency Medicine | Admitting: Emergency Medicine

## 2013-01-31 ENCOUNTER — Encounter (HOSPITAL_COMMUNITY): Payer: Self-pay | Admitting: Emergency Medicine

## 2013-01-31 ENCOUNTER — Emergency Department (HOSPITAL_COMMUNITY): Payer: BC Managed Care – PPO

## 2013-01-31 DIAGNOSIS — F17200 Nicotine dependence, unspecified, uncomplicated: Secondary | ICD-10-CM | POA: Insufficient documentation

## 2013-01-31 DIAGNOSIS — R059 Cough, unspecified: Secondary | ICD-10-CM | POA: Insufficient documentation

## 2013-01-31 DIAGNOSIS — R05 Cough: Secondary | ICD-10-CM

## 2013-01-31 DIAGNOSIS — R6883 Chills (without fever): Secondary | ICD-10-CM | POA: Insufficient documentation

## 2013-01-31 DIAGNOSIS — R0602 Shortness of breath: Secondary | ICD-10-CM | POA: Insufficient documentation

## 2013-01-31 DIAGNOSIS — Z8739 Personal history of other diseases of the musculoskeletal system and connective tissue: Secondary | ICD-10-CM | POA: Insufficient documentation

## 2013-01-31 DIAGNOSIS — Z8659 Personal history of other mental and behavioral disorders: Secondary | ICD-10-CM | POA: Insufficient documentation

## 2013-01-31 DIAGNOSIS — R1110 Vomiting, unspecified: Secondary | ICD-10-CM | POA: Insufficient documentation

## 2013-01-31 DIAGNOSIS — M549 Dorsalgia, unspecified: Secondary | ICD-10-CM | POA: Insufficient documentation

## 2013-01-31 DIAGNOSIS — Z79899 Other long term (current) drug therapy: Secondary | ICD-10-CM | POA: Insufficient documentation

## 2013-01-31 DIAGNOSIS — G8929 Other chronic pain: Secondary | ICD-10-CM | POA: Insufficient documentation

## 2013-01-31 DIAGNOSIS — I10 Essential (primary) hypertension: Secondary | ICD-10-CM | POA: Insufficient documentation

## 2013-01-31 DIAGNOSIS — R5381 Other malaise: Secondary | ICD-10-CM | POA: Insufficient documentation

## 2013-01-31 LAB — COMPREHENSIVE METABOLIC PANEL
ALT: 9 U/L (ref 0–35)
AST: 27 U/L (ref 0–37)
Albumin: 3.6 g/dL (ref 3.5–5.2)
CO2: 30 mEq/L (ref 19–32)
Calcium: 9.3 mg/dL (ref 8.4–10.5)
Chloride: 97 mEq/L (ref 96–112)
Creatinine, Ser: 0.65 mg/dL (ref 0.50–1.10)
GFR calc non Af Amer: >90 mL/min (ref >90)
Sodium: 137 mEq/L (ref 135–145)
Total Bilirubin: 0.2 mg/dL — ABNORMAL LOW (ref 0.3–1.2)

## 2013-01-31 LAB — CBC WITH DIFFERENTIAL/PLATELET
Basophils Absolute: 0 10*3/uL (ref 0.0–0.1)
Basophils Relative: 0 % (ref 0–1)
Lymphocytes Relative: 29 % (ref 12–46)
MCHC: 34.3 g/dL (ref 30.0–36.0)
Neutro Abs: 5.1 10*3/uL (ref 1.7–7.7)
Platelets: 325 10*3/uL (ref 150–400)
RDW: 13.6 % (ref 11.5–15.5)
WBC: 8 10*3/uL (ref 4.0–10.5)

## 2013-01-31 LAB — POCT I-STAT TROPONIN I: Troponin i, poc: 0 ng/mL (ref 0.00–0.08)

## 2013-01-31 MED ORDER — HYDROCOD POLST-CHLORPHEN POLST 10-8 MG/5ML PO LQCR: 5.0000 mL | Freq: Every evening | ORAL | Status: DC | PRN

## 2013-01-31 NOTE — ED Provider Notes (Signed)
CSN: [id number]     Arrival date & time 01/31/13  1128 History   First MD Initiated Contact with Patient 01/31/13 1146     Chief Complaint  Patient presents with  . Chest Pain  . Shortness of Breath   (Consider location/radiation/quality/duration/timing/severity/associated sxs/prior Treatment) HPI Comments: Pt states that she drove to and from [place] last weekend and the day after she got back she developed weakness and sob:pt states that the symptoms have worsened over the last week:she has had a productive cough and chills:this morning she developed intermittent substernal pain and some vomiting:pt states that she has had a cath in the past:she states that she has had swelling in all her extremities over the last week  The history is provided by the patient. No language interpreter was used.    Past Medical History  Diagnosis Date  . Hypertension   . Arthritis   . Chronic back pain   . Chest pain 2006  . Anxiety    Past Surgical History  Procedure Laterality Date  . Abdominal hysterectomy  1997  . Cardiac catheterization  2006  . Ingrown toenail removal    . Tonsillectomy    . Hemi-microdiscectomy lumbar laminectomy level 1  05/14/2012    Procedure: HEMI-MICRODISCECTOMY LUMBAR LAMINECTOMY LEVEL 1;  Surgeon: Jacki Cones, MD;  Location: [facility];  Service: Orthopedics;  Laterality: N/A;  Hemi Laminectomy Microdiscectomy L4 - L5 Central (X-Ray)   No family history on file. History  Substance Use Topics  . Smoking status: Current Every Day Smoker -- 1.00 packs/day for 30 years    Types: Cigarettes  . Smokeless tobacco: Never Used  . Alcohol Use: No   OB History   Grav Para Term Preterm Abortions TAB SAB Ect Mult Living                 Review of Systems  Constitutional: Negative.   Respiratory: Negative.   Cardiovascular: Negative.     Allergies  Review of patient's allergies indicates no known allergies.  Home Medications   Current Outpatient Rx  Name   Route  Sig  Dispense  Refill  . enalapril-hydrochlorothiazide (VASERETIC) 10-25 MG per tablet   Oral   Take 1 tablet by mouth daily before breakfast.          . HYDROcodone-acetaminophen (NORCO/VICODIN) 5-325 MG per tablet   Oral   Take 1 tablet by mouth every 6 (six) hours as needed for pain.   15 tablet   0   . ibuprofen (ADVIL,MOTRIN) 800 MG tablet   Oral   Take 1 tablet (800 mg total) by mouth every 8 (eight) hours as needed for pain.   21 tablet   0   . oxyCODONE-acetaminophen (PERCOCET/ROXICET) 5-325 MG per tablet   Oral   Take 1 tablet by mouth every 4 (four) hours as needed for pain.         Marland Kitchen zolpidem (AMBIEN) 10 MG tablet   Oral   Take 10 mg by mouth at bedtime as needed. For sleep          BP 123/66  Pulse 71  Temp(Src) 98.3 F (36.8 C) (Oral)  Resp 16  Ht 5\' 1"  (1.549 m)  Wt 138 lb (62.596 kg)  BMI 26.09 kg/m2  SpO2 98% Physical Exam  Nursing note and vitals reviewed. Constitutional: She is oriented to person, place, and time. She appears well-developed and well-nourished.  HENT:  Head: Normocephalic and atraumatic.  Eyes: Conjunctivae and EOM are normal.  Neck: Normal range of motion. Neck supple.  Cardiovascular: Normal rate and regular rhythm.   Pulmonary/Chest: Effort normal and breath sounds normal.  Abdominal: Soft. Bowel sounds are normal. There is no tenderness.  Musculoskeletal: Normal range of motion.  Neurological: She is alert and oriented to person, place, and time.  Skin: Skin is warm and dry.  Psychiatric: She has a normal mood and affect.    ED Course  Procedures (including critical care time) Labs Review Labs Reviewed  COMPREHENSIVE METABOLIC PANEL - Abnormal; Notable for the following:    Glucose, Bld 100 (*)    Total Bilirubin 0.2 (*)    All other components within normal limits  CBC WITH DIFFERENTIAL  D-DIMER, QUANTITATIVE  URINALYSIS, ROUTINE W REFLEX MICROSCOPIC  POCT I-STAT TROPONIN I    Date: 01/31/2013  Rate:  64  Rhythm: normal sinus rhythm  QRS Axis: normal  Intervals: normal  ST/T Wave abnormalities: normal  Conduction Disutrbances:none  Narrative Interpretation:   Old EKG Reviewed: unchanged   Imaging Review Dg Chest 2 View  01/31/2013   *RADIOLOGY REPORT*  Clinical Data: Chest pain and shortness of breath.  Congestion and cough for 1 week.  CHEST - 2 VIEW  Comparison: 05/12/2012  Findings: Lungs are clear.  The cardiomediastinal silhouette is within normal.  There is mild spondylosis of the spine.  IMPRESSION: No acute cardiopulmonary disease.   Original Report Authenticated By: Elberta Fortis, M.D.    MDM   1. Cough    Pt refusing to given urine:pt states that if the chest xray is fine she will go home and treat the cold:doubt cp is cardiac in nature and pt is pain free at this time    Teressa Lower, NP 01/31/13 1409

## 2013-01-31 NOTE — ED Notes (Addendum)
Pt with cough after driving to and from [place] last week.  She became increasingly nauseated and sob.  This morning she awoke very sob and had emesis of mucus accompanied by substernal chest pain.  VS stable at this time.

## 2013-01-31 NOTE — ED Notes (Addendum)
EKG done at 1146 and given Dr. Deretha Emory.

## 2013-01-31 NOTE — ED Provider Notes (Signed)
Medical screening examination/treatment/procedure(s) were performed by non-physician practitioner and as supervising physician I was immediately available for consultation/collaboration.   Shelda Jakes, MD 01/31/13 1409

## 2013-02-02 ENCOUNTER — Emergency Department (INDEPENDENT_AMBULATORY_CARE_PROVIDER_SITE_OTHER)
Admission: EM | Admit: 2013-02-02 | Discharge: 2013-02-02 | Disposition: A | Discharge status: Home / Self Care | Payer: BC Managed Care – PPO | Source: Home / Self Care | Attending: Family Medicine | Admitting: Family Medicine

## 2013-02-02 ENCOUNTER — Encounter (HOSPITAL_COMMUNITY): Payer: Self-pay | Admitting: Emergency Medicine

## 2013-02-02 DIAGNOSIS — H109 Unspecified conjunctivitis: Secondary | ICD-10-CM

## 2013-02-02 DIAGNOSIS — J410 Simple chronic bronchitis: Secondary | ICD-10-CM

## 2013-02-02 DIAGNOSIS — J40 Bronchitis, not specified as acute or chronic: Secondary | ICD-10-CM

## 2013-02-02 DIAGNOSIS — Z720 Tobacco use: Secondary | ICD-10-CM

## 2013-02-02 MED ORDER — LEVOFLOXACIN 500 MG PO TABS: 500.0000 mg | ORAL_TABLET | Freq: Every day | ORAL | Status: DC

## 2013-02-02 MED ORDER — TOBRAMYCIN 0.3 % OP SOLN: 1.0000 [drp] | Freq: Four times a day (QID) | OPHTHALMIC | Status: DC

## 2013-02-02 NOTE — ED Provider Notes (Signed)
CSN: [id number]     Arrival date & time 02/02/13  1701 History   First MD Initiated Contact with Patient 02/02/13 1725     Chief Complaint  Patient presents with  . Cough  . Conjunctivitis   (Consider location/radiation/quality/duration/timing/severity/associated sxs/prior Treatment) Patient is a 57 y.o. female presenting with cough and conjunctivitis. The history is provided by the patient.  Cough Cough characteristics:  Productive Sputum characteristics:  Green Severity:  Moderate Duration:  3 days Associated symptoms: eye discharge   Associated symptoms: no fever   Associated symptoms comment:  Seen in ER on sat for same, never got rx filled. Risk factors: recent travel   Risk factors comment:  Smoker Conjunctivitis    Past Medical History  Diagnosis Date  . Hypertension   . Arthritis   . Chronic back pain   . Chest pain 2006  . Anxiety    Past Surgical History  Procedure Laterality Date  . Abdominal hysterectomy  1997  . Cardiac catheterization  2006  . Ingrown toenail removal    . Tonsillectomy    . Hemi-microdiscectomy lumbar laminectomy level 1  05/14/2012    Procedure: HEMI-MICRODISCECTOMY LUMBAR LAMINECTOMY LEVEL 1;  Surgeon: Jacki Cones, MD;  Location: [facility];  Service: Orthopedics;  Laterality: N/A;  Hemi Laminectomy Microdiscectomy L4 - L5 Central (X-Ray)   History reviewed. No pertinent family history. History  Substance Use Topics  . Smoking status: Current Every Day Smoker -- 1.00 packs/day for 30 years    Types: Cigarettes  . Smokeless tobacco: Never Used  . Alcohol Use: No   OB History   Grav Para Term Preterm Abortions TAB SAB Ect Mult Living                 Review of Systems  Constitutional: Negative.  Negative for fever.  Eyes: Positive for discharge and redness.  Respiratory: Positive for cough.     Allergies  Review of patient's allergies indicates no known allergies.  Home Medications   Current Outpatient Rx  Name  Route   Sig  Dispense  Refill  . chlorpheniramine-HYDROcodone (TUSSIONEX PENNKINETIC ER) 10-8 MG/5ML LQCR   Oral   Take 5 mLs by mouth at bedtime as needed.   140 mL   0   . enalapril-hydrochlorothiazide (VASERETIC) 10-25 MG per tablet   Oral   Take 1 tablet by mouth daily before breakfast.          . levofloxacin (LEVAQUIN) 500 MG tablet   Oral   Take 1 tablet (500 mg total) by mouth daily.   7 tablet   0   . oxyCODONE-acetaminophen (PERCOCET/ROXICET) 5-325 MG per tablet   Oral   Take 1 tablet by mouth every 4 (four) hours as needed for pain.         Marland Kitchen tobramycin (TOBREX) 0.3 % ophthalmic solution   Right Eye   Place 1 drop into the right eye every 6 (six) hours.   5 mL   0    BP 132/64  Pulse 64  Temp(Src) 98 F (36.7 C) (Oral)  Resp 18  SpO2 100% Physical Exam  Nursing note and vitals reviewed. Constitutional: She is oriented to person, place, and time. She appears well-developed and well-nourished.  HENT:  Head: Normocephalic.  Right Ear: External ear normal.  Left Ear: External ear normal.  Mouth/Throat: Oropharynx is clear and moist.  Eyes: EOM are normal. Pupils are equal, round, and reactive to light. Right eye exhibits discharge. Left eye exhibits no  discharge. Right conjunctiva is injected. Right conjunctiva has no hemorrhage.  Neck: Normal range of motion. Neck supple.  Cardiovascular: Normal rate.   Pulmonary/Chest: Breath sounds normal.  Lymphadenopathy:    She has no cervical adenopathy.  Neurological: She is alert and oriented to person, place, and time.  Skin: Skin is warm and dry.    ED Course  Procedures (including critical care time) Labs Review Labs Reviewed - No data to display Imaging Review No results found.  MDM       Linna Hoff, MD 02/02/13 [phone]

## 2013-02-02 NOTE — ED Notes (Signed)
C/o right eye being red and having a cold with cough.   Patient states she was seen at the ER on Saturday.  Patient states when she woke up today her eye was red and has crust on the eye lid.  Patient states eye is painful

## 2013-02-12 ENCOUNTER — Ambulatory Visit (HOSPITAL_COMMUNITY)
Admission: RE | Admit: 2013-02-12 | Payer: BC Managed Care – PPO | Source: Ambulatory Visit | Admitting: Orthopedic Surgery

## 2013-02-12 ENCOUNTER — Encounter (HOSPITAL_COMMUNITY): Admission: RE | Payer: Self-pay | Source: Ambulatory Visit

## 2013-02-12 SURGERY — ARTHROPLASTY, HIP, TOTAL,POSTERIOR APPROACH
Anesthesia: General | Site: Hip | Laterality: Left

## 2013-02-24 ENCOUNTER — Emergency Department (INDEPENDENT_AMBULATORY_CARE_PROVIDER_SITE_OTHER)
Admission: EM | Admit: 2013-02-24 | Discharge: 2013-02-24 | Disposition: A | Discharge status: Home / Self Care | Payer: BC Managed Care – PPO | Source: Home / Self Care

## 2013-02-24 ENCOUNTER — Encounter (HOSPITAL_COMMUNITY): Payer: Self-pay | Admitting: Emergency Medicine

## 2013-02-24 DIAGNOSIS — Z8709 Personal history of other diseases of the respiratory system: Secondary | ICD-10-CM

## 2013-02-24 DIAGNOSIS — F17200 Nicotine dependence, unspecified, uncomplicated: Secondary | ICD-10-CM

## 2013-02-24 MED ORDER — ALBUTEROL SULFATE HFA 108 (90 BASE) MCG/ACT IN AERS: 2.0000 | INHALATION_SPRAY | RESPIRATORY_TRACT | Status: DC | PRN

## 2013-02-24 NOTE — ED Provider Notes (Signed)
CSN: [id number]     Arrival date & time 02/24/13  1738 History   None    Chief Complaint  Patient presents with  . Medication Refill    chantix and albuterol inhaler   (Consider location/radiation/quality/duration/timing/severity/associated sxs/prior Treatment) HPI Comments: 57 year old female patient is here to get refills on her albuterol and a refill on Chantix. She is a current daily smoker with a 30 pack year history and is willing to start Chantix today. Apparently, she was advised by her PCP which also works at another urgent care that they would be able to provide her with Chantix. She did not have a ride to that facility so decided to come to the [facility]. She is not suffering or complaining of symptoms at this time.   Past Medical History  Diagnosis Date  . Hypertension   . Arthritis   . Chronic back pain   . Chest pain 2006  . Anxiety    Past Surgical History  Procedure Laterality Date  . Abdominal hysterectomy  1997  . Cardiac catheterization  2006  . Ingrown toenail removal    . Tonsillectomy    . Hemi-microdiscectomy lumbar laminectomy level 1  05/14/2012    Procedure: HEMI-MICRODISCECTOMY LUMBAR LAMINECTOMY LEVEL 1;  Surgeon: Jacki Cones, MD;  Location: [facility];  Service: Orthopedics;  Laterality: N/A;  Hemi Laminectomy Microdiscectomy L4 - L5 Central (X-Ray)   History reviewed. No pertinent family history. History  Substance Use Topics  . Smoking status: Current Every Day Smoker -- 1.00 packs/day for 30 years    Types: Cigarettes  . Smokeless tobacco: Never Used  . Alcohol Use: No   OB History   Grav Para Term Preterm Abortions TAB SAB Ect Mult Living                 Review of Systems  Constitutional: Positive for activity change. Negative for fever and fatigue.  HENT: Negative.   Respiratory: Positive for shortness of breath.        With exertion.  Cardiovascular: Negative.   Neurological: Negative.     Allergies  Review of  patient's allergies indicates no known allergies.  Home Medications   Current Outpatient Rx  Name  Route  Sig  Dispense  Refill  . chlorpheniramine-HYDROcodone (TUSSIONEX PENNKINETIC ER) 10-8 MG/5ML LQCR   Oral   Take 5 mLs by mouth at bedtime as needed.   140 mL   0   . enalapril-hydrochlorothiazide (VASERETIC) 10-25 MG per tablet   Oral   Take 1 tablet by mouth daily before breakfast.          . levofloxacin (LEVAQUIN) 500 MG tablet   Oral   Take 1 tablet (500 mg total) by mouth daily.   7 tablet   0   . oxyCODONE-acetaminophen (PERCOCET/ROXICET) 5-325 MG per tablet   Oral   Take 1 tablet by mouth every 4 (four) hours as needed for pain.         Marland Kitchen tobramycin (TOBREX) 0.3 % ophthalmic solution   Right Eye   Place 1 drop into the right eye every 6 (six) hours.   5 mL   0    BP 129/80  Pulse 65  Temp(Src) 98.1 F (36.7 C) (Oral)  Resp 14  SpO2 100% Physical Exam  Nursing note and vitals reviewed. Constitutional: She is oriented to person, place, and time. She appears well-developed and well-nourished. No distress.  Neck: Normal range of motion. Neck supple.  Cardiovascular: Normal  rate, regular rhythm and normal heart sounds.   Pulmonary/Chest: Effort normal and breath sounds normal. No respiratory distress. She has no wheezes.  Lymphadenopathy:    She has no cervical adenopathy.  Neurological: She is alert and oriented to person, place, and time.  Skin: Skin is warm and dry.    ED Course  Procedures (including critical care time) Labs Review Labs Reviewed - No data to display Imaging Review No results found.  MDM   1. Tobacco use disorder   2. History of acute bronchitis with bronchospasm     Due to the potential side effects and the necessity for a discussion on smoking cessation and a more in-depth knowledge of past medical and psychological history I am unable to provide her with the Chantix. Advised her that I would be able to help her with the  albuterol inhaler. She states she was told that the front office that we would be able to give her the Chantix. She walked out stating that she intended to get her money back and did not stay in the clinical area to receive her albuterol HFA.  Several minutes later the patient returned stating that she wanted her refill of albuterol. Albuterol Rx is given along with information on smoking cessation.    Hayden Rasmussen, NP 02/24/13 1906  Hayden Rasmussen, NP 02/24/13 2204

## 2013-02-24 NOTE — ED Notes (Signed)
Walked pt out to lobby to talk to friend who is accompanied pt here Friend is no where to be found... Pt reports she probably went to get something to eat Adv pt that he is considered to be under the influence... Pt states now that his friend is waiting at the bust stop.

## 2013-02-24 NOTE — ED Provider Notes (Signed)
Medical screening examination/treatment/procedure(s) were performed by non-physician practitioner and as supervising physician I was immediately available for consultation/collaboration.  Teckla Christiansen, M.D.  Marinell Igarashi C Jobina Maita, MD 02/24/13 2250 

## 2013-02-24 NOTE — ED Notes (Signed)
Reports needing refill on albuterol inhaler and Rx for chantix  Pt voices no other concerns at this time.

## 2013-03-21 ENCOUNTER — Emergency Department (INDEPENDENT_AMBULATORY_CARE_PROVIDER_SITE_OTHER)
Admission: EM | Admit: 2013-03-21 | Discharge: 2013-03-21 | Disposition: A | Discharge status: Home / Self Care | Payer: BC Managed Care – PPO | Source: Home / Self Care | Attending: Emergency Medicine | Admitting: Emergency Medicine

## 2013-03-21 ENCOUNTER — Encounter (HOSPITAL_COMMUNITY): Payer: Self-pay | Admitting: Emergency Medicine

## 2013-03-21 DIAGNOSIS — I10 Essential (primary) hypertension: Secondary | ICD-10-CM

## 2013-03-21 LAB — POCT URINALYSIS DIP (DEVICE)
Bilirubin Urine: NEGATIVE
Leukocytes, UA: NEGATIVE
Nitrite: NEGATIVE
Protein, ur: NEGATIVE mg/dL
Urobilinogen, UA: 0.2 mg/dL (ref 0.0–1.0)
pH: 5 (ref 5.0–8.0)

## 2013-03-21 LAB — POCT I-STAT, CHEM 8
Calcium, Ion: 1.24 mmol/L — ABNORMAL HIGH (ref 1.12–1.23)
Glucose, Bld: 135 mg/dL — ABNORMAL HIGH (ref 70–99)
HCT: 43 % (ref 36.0–46.0)
Hemoglobin: 14.6 g/dL (ref 12.0–15.0)
TCO2: 27 mmol/L (ref 0–100)

## 2013-03-21 MED ORDER — ENALAPRIL-HYDROCHLOROTHIAZIDE 10-25 MG PO TABS: 1.0000 | ORAL_TABLET | Freq: Every day | ORAL | Status: DC

## 2013-03-21 NOTE — ED Notes (Signed)
Pt c/o she has been out of her medication since 10-19, has not eaten since earlier this AM. C/o she does not empty her bladder as well as she should ; NAD

## 2013-03-21 NOTE — ED Provider Notes (Signed)
Chief Complaint:   Chief Complaint  Patient presents with  . Medication Refill    History of Present Illness:   [name] is a 57 year old female with a 30 year history of hypertension. She presents today for refills on her medication. She takes enalapril/HCTZ 10/25 one per day. She's been out of her medication for a month. Since then she thinks her blood pressure has been up since she has felt headachy and dizzy. She denies any blurry vision, shortness of breath, chest pain, pressure, tightness, ankle edema, or strokelike symptoms. She has no history of diabetes, hypercholesterolemia, or kidney disease. She is a cigarette smoker and smokes about a pack of cigarettes per day. The patient notes that she's not careful about her salt or sodium intake. She denies any medication side effects from her medication.  Review of Systems:  Other than noted above, the patient denies any of the following symptoms: Systemic:  No fever, chills, fatigue, weight loss or gain. Respiratory:  No coughing, wheezing, or shortness of breath. Cardiac:  No chest pain, tightness, pressure, palpitations, syncope, or edema. Neuro:  No headache, dizziness, blurred vision, weakness, paresthesias, or strokelike symptoms.  PMFSH:  Past medical history, family history, social history, meds, and allergies were reviewed.  Physical Exam:   Vital signs:  BP 142/76  Pulse 56  Temp(Src) 97.9 F (36.6 C) (Oral)  Resp 18  SpO2 100% General:  Alert, oriented, in no distress. Lungs:  Breath sounds clear and equal bilaterally.  No wheezes, rales, or rhonchi. Heart:  Regular rhythm, no gallops, murmers, clicks or rubs.  Abdomen:  Soft and flat.  Nontender, no organomegaly or mass.  No pulsatile midline abdominal mass or bruit. Ext:  No edema, pulses full. Neurological exam:  Alert and oriented.  Speech is clear.  No pronator drift.  CNs intact.  Labs:   Results for orders placed during the hospital encounter of 03/21/13   POCT URINALYSIS DIP (DEVICE)      Result Value Range   Glucose, UA NEGATIVE  NEGATIVE mg/dL   Bilirubin Urine NEGATIVE  NEGATIVE   Ketones, ur NEGATIVE  NEGATIVE mg/dL   Specific Gravity, Urine >=1.030  1.005 - 1.030   Hgb urine dipstick TRACE (*) NEGATIVE   pH 5.0  5.0 - 8.0   Protein, ur NEGATIVE  NEGATIVE mg/dL   Urobilinogen, UA 0.2  0.0 - 1.0 mg/dL   Nitrite NEGATIVE  NEGATIVE   Leukocytes, UA NEGATIVE  NEGATIVE  POCT I-STAT, CHEM 8      Result Value Range   Sodium 143  135 - 145 mEq/L   Potassium 3.6  3.5 - 5.1 mEq/L   Chloride 105  96 - 112 mEq/L   BUN 11  6 - 23 mg/dL   Creatinine, Ser 1.61  0.50 - 1.10 mg/dL   Glucose, Bld 096 (*) 70 - 99 mg/dL   Calcium, Ion 0.45 (*) 1.12 - 1.23 mmol/L   TCO2 27  0 - 100 mmol/L   Hemoglobin 14.6  12.0 - 15.0 g/dL   HCT 40.9  81.1 - 91.4 %    Assessment:  The encounter diagnosis was Hypertension.  Plan:   1.  Meds:  The following meds were prescribed:   Discharge Medication List as of 03/21/2013  5:03 PM    START taking these medications   Details  !! enalapril-hydrochlorothiazide (VASERETIC) 10-25 MG per tablet Take 1 tablet by mouth daily., Starting 03/21/2013, Until Discontinued, Normal     !! - Potential duplicate  medications found. Please discuss with provider.      2.  Patient Education/Counseling:  The patient was given appropriate handouts, self care instructions, and instructed in symptomatic relief. Specifically discussed salt and sodium restriction, weight control, and exercise.  3.  Follow up:  The patient was told to follow up if no better in 3 to 4 days, if becoming worse in any way, and given some red flag symptoms such as chest pain or shortness of breath which would prompt immediate return.  Follow up with Dr. Maryelizabeth Rowan within the next month.     Reuben Likes, MD 03/21/13 2214

## 2013-06-07 ENCOUNTER — Encounter (HOSPITAL_COMMUNITY): Payer: Self-pay | Admitting: Emergency Medicine

## 2013-06-07 ENCOUNTER — Emergency Department (INDEPENDENT_AMBULATORY_CARE_PROVIDER_SITE_OTHER)
Admission: EM | Admit: 2013-06-07 | Discharge: 2013-06-07 | Disposition: A | Discharge status: Home / Self Care | Payer: BC Managed Care – PPO | Source: Home / Self Care

## 2013-06-07 DIAGNOSIS — J019 Acute sinusitis, unspecified: Secondary | ICD-10-CM

## 2013-06-07 DIAGNOSIS — J209 Acute bronchitis, unspecified: Secondary | ICD-10-CM

## 2013-06-07 DIAGNOSIS — J40 Bronchitis, not specified as acute or chronic: Secondary | ICD-10-CM

## 2013-06-07 MED ORDER — AZITHROMYCIN 250 MG PO TABS: 250.0000 mg | ORAL_TABLET | Freq: Every day | ORAL | Status: DC

## 2013-06-07 MED ORDER — ALBUTEROL SULFATE HFA 108 (90 BASE) MCG/ACT IN AERS: 2.0000 | INHALATION_SPRAY | RESPIRATORY_TRACT | Status: DC | PRN

## 2013-06-07 MED ORDER — TRIAMCINOLONE ACETONIDE 40 MG/ML IJ SUSP
INTRAMUSCULAR | Status: AC
  Filled 2013-06-07: qty 1

## 2013-06-07 MED ORDER — TRIAMCINOLONE ACETONIDE 40 MG/ML IJ SUSP
40.0000 mg | Freq: Once | INTRAMUSCULAR | Status: AC
  Administered 2013-06-07: 40 mg via INTRAMUSCULAR

## 2013-06-07 MED ORDER — IPRATROPIUM-ALBUTEROL 0.5-2.5 (3) MG/3ML IN SOLN
RESPIRATORY_TRACT | Status: AC
  Filled 2013-06-07: qty 3

## 2013-06-07 MED ORDER — IPRATROPIUM-ALBUTEROL 0.5-2.5 (3) MG/3ML IN SOLN
3.0000 mL | Freq: Once | RESPIRATORY_TRACT | Status: AC
  Administered 2013-06-07: 3 mL via RESPIRATORY_TRACT

## 2013-06-07 MED ORDER — METHYLPREDNISOLONE 4 MG PO KIT: PACK | ORAL | Status: DC

## 2013-06-07 NOTE — Discharge Instructions (Signed)
Acute Bronchitis Bronchitis is inflammation of the airways that extend from the windpipe into the lungs (bronchi). The inflammation often causes mucus to develop. This leads to a cough, which is the most common symptom of bronchitis.  In acute bronchitis, the condition usually develops suddenly and goes away over time, usually in a couple weeks. Smoking, allergies, and asthma can make bronchitis worse. Repeated episodes of bronchitis may cause further lung problems.  CAUSES Acute bronchitis is most often caused by the same virus that causes a cold. The virus can spread from person to person (contagious).  SIGNS AND SYMPTOMS   Cough.   Fever.   Coughing up mucus.   Body aches.   Chest congestion.   Chills.   Shortness of breath.   Sore throat.  DIAGNOSIS  Acute bronchitis is usually diagnosed through a physical exam. Tests, such as chest X-rays, are sometimes done to rule out other conditions.  TREATMENT  Acute bronchitis usually goes away in a couple weeks. Often times, no medical treatment is necessary. Medicines are sometimes given for relief of fever or cough. Antibiotics are usually not needed but may be prescribed in certain situations. In some cases, an inhaler may be recommended to help reduce shortness of breath and control the cough. A cool mist vaporizer may also be used to help thin bronchial secretions and make it easier to clear the chest.  HOME CARE INSTRUCTIONS  Get plenty of rest.   Drink enough fluids to keep your urine clear or pale yellow (unless you have a medical condition that requires fluid restriction). Increasing fluids may help thin your secretions and will prevent dehydration.   Only take over-the-counter or prescription medicines as directed by your health care provider.   Avoid smoking and secondhand smoke. Exposure to cigarette smoke or irritating chemicals will make bronchitis worse. If you are a smoker, consider using nicotine gum or skin  patches to help control withdrawal symptoms. Quitting smoking will help your lungs heal faster.   Reduce the chances of another bout of acute bronchitis by washing your hands frequently, avoiding people with cold symptoms, and trying not to touch your hands to your mouth, nose, or eyes.   Follow up with your health care provider as directed.  SEEK MEDICAL CARE IF: Your symptoms do not improve after 1 week of treatment.  SEEK IMMEDIATE MEDICAL CARE IF:  You develop an increased fever or chills.   You have chest pain.   You have severe shortness of breath.  You have bloody sputum.   You develop dehydration.  You develop fainting.  You develop repeated vomiting.  You develop a severe headache. MAKE SURE YOU:   Understand these instructions.  Will watch your condition.  Will get help right away if you are not doing well or get worse. Document Released: 06/21/2004 Document Revised: 01/14/2013 Document Reviewed: 11/04/2012 [facility] Patient Information 2014 [place].  Cough, Adult Allegra or claritin daily for drainage. At night may take Alka Seltzer cold Plus Night time Formula  A cough is a reflex that helps clear your throat and airways. It can help heal the body or may be a reaction to an irritated airway. A cough may only last 2 or 3 weeks (acute) or may last more than 8 weeks (chronic).  CAUSES Acute cough:  Viral or bacterial infections. Chronic cough:  Infections.  Allergies.  Asthma.  Post-nasal drip.  Smoking.  Heartburn or acid reflux.  Some medicines.  Chronic lung problems (COPD).  Cancer. SYMPTOMS  Cough.  Fever.  Chest pain.  Increased breathing rate.  High-pitched whistling sound when breathing (wheezing).  Colored mucus that you cough up (sputum). TREATMENT   A bacterial cough may be treated with antibiotic medicine.  A viral cough must run its course and will not respond to antibiotics.  Your caregiver may  recommend other treatments if you have a chronic cough. HOME CARE INSTRUCTIONS   Only take over-the-counter or prescription medicines for pain, discomfort, or fever as directed by your caregiver. Use cough suppressants only as directed by your caregiver.  Use a cold steam vaporizer or humidifier in your bedroom or home to help loosen secretions.  Sleep in a semi-upright position if your cough is worse at night.  Rest as needed.  Stop smoking if you smoke. SEEK IMMEDIATE MEDICAL CARE IF:   You have pus in your sputum.  Your cough starts to worsen.  You cannot control your cough with suppressants and are losing sleep.  You begin coughing up blood.  You have difficulty breathing.  You develop pain which is getting worse or is uncontrolled with medicine.  You have a fever. MAKE SURE YOU:   Understand these instructions.  Will watch your condition.  Will get help right away if you are not doing well or get worse. Document Released: 11/10/2010 Document Revised: 08/06/2011 Document Reviewed: 11/10/2010 [facility] Patient Information 2014 [place], [place].  Upper Respiratory Infection, Adult An upper respiratory infection (URI) is also sometimes known as the common cold. The upper respiratory tract includes the nose, sinuses, throat, trachea, and bronchi. Bronchi are the airways leading to the lungs. Most people improve within 1 week, but symptoms can last up to 2 weeks. A residual cough may last even longer.  CAUSES Many different viruses can infect the tissues lining the upper respiratory tract. The tissues become irritated and inflamed and often become very moist. Mucus production is also common. A cold is contagious. You can easily spread the virus to others by oral contact. This includes kissing, sharing a glass, coughing, or sneezing. Touching your mouth or nose and then touching a surface, which is then touched by another person, can also spread the virus. SYMPTOMS  Symptoms  typically develop 1 to 3 days after you come in contact with a cold virus. Symptoms vary from person to person. They may include:  Runny nose.  Sneezing.  Nasal congestion.  Sinus irritation.  Sore throat.  Loss of voice (laryngitis).  Cough.  Fatigue.  Muscle aches.  Loss of appetite.  Headache.  Low-grade fever. DIAGNOSIS  You might diagnose your own cold based on familiar symptoms, since most people get a cold 2 to 3 times a year. Your caregiver can confirm this based on your exam. Most importantly, your caregiver can check that your symptoms are not due to another disease such as strep throat, sinusitis, pneumonia, asthma, or epiglottitis. Blood tests, throat tests, and X-rays are not necessary to diagnose a common cold, but they may sometimes be helpful in excluding other more serious diseases. Your caregiver will decide if any further tests are required. RISKS AND COMPLICATIONS  You may be at risk for a more severe case of the common cold if you smoke cigarettes, have chronic heart disease (such as heart failure) or lung disease (such as asthma), or if you have a weakened immune system. The very young and very old are also at risk for more serious infections. Bacterial sinusitis, middle ear infections, and bacterial pneumonia can complicate the common cold.  The common cold can worsen asthma and chronic obstructive pulmonary disease (COPD). Sometimes, these complications can require emergency medical care and may be life-threatening. PREVENTION  The best way to protect against getting a cold is to practice good hygiene. Avoid oral or hand contact with people with cold symptoms. Wash your hands often if contact occurs. There is no clear evidence that vitamin C, vitamin E, echinacea, or exercise reduces the chance of developing a cold. However, it is always recommended to get plenty of rest and practice good nutrition. TREATMENT  Treatment is directed at relieving symptoms. There  is no cure. Antibiotics are not effective, because the infection is caused by a virus, not by bacteria. Treatment may include:  Increased fluid intake. Sports drinks offer valuable electrolytes, sugars, and fluids.  Breathing heated mist or steam (vaporizer or shower).  Eating chicken soup or other clear broths, and maintaining good nutrition.  Getting plenty of rest.  Using gargles or lozenges for comfort.  Controlling fevers with ibuprofen or acetaminophen as directed by your caregiver.  Increasing usage of your inhaler if you have asthma. Zinc gel and zinc lozenges, taken in the first 24 hours of the common cold, can shorten the duration and lessen the severity of symptoms. Pain medicines may help with fever, muscle aches, and throat pain. A variety of non-prescription medicines are available to treat congestion and runny nose. Your caregiver can make recommendations and may suggest nasal or lung inhalers for other symptoms.  HOME CARE INSTRUCTIONS   Only take over-the-counter or prescription medicines for pain, discomfort, or fever as directed by your caregiver.  Use a warm mist humidifier or inhale steam from a shower to increase air moisture. This may keep secretions moist and make it easier to breathe.  Drink enough water and fluids to keep your urine clear or pale yellow.  Rest as needed.  Return to work when your temperature has returned to normal or as your caregiver advises. You may need to stay home longer to avoid infecting others. You can also use a face mask and careful hand washing to prevent spread of the virus. SEEK MEDICAL CARE IF:   After the first few days, you feel you are getting worse rather than better.  You need your caregiver's advice about medicines to control symptoms.  You develop chills, worsening shortness of breath, or brown or red sputum. These may be signs of pneumonia.  You develop yellow or brown nasal discharge or pain in the face, especially  when you bend forward. These may be signs of sinusitis.  You develop a fever, swollen neck glands, pain with swallowing, or white areas in the back of your throat. These may be signs of strep throat. SEEK IMMEDIATE MEDICAL CARE IF:   You have a fever.  You develop severe or persistent headache, ear pain, sinus pain, or chest pain.  You develop wheezing, a prolonged cough, cough up blood, or have a change in your usual mucus (if you have chronic lung disease).  You develop sore muscles or a stiff neck. Document Released: 11/07/2000 Document Revised: 08/06/2011 Document Reviewed: 09/15/2010 [facility] Patient Information 2014 [place], [place].

## 2013-06-07 NOTE — ED Provider Notes (Signed)
CSN: [id number]     Arrival date & time 06/07/13  1256 History   First MD Initiated Contact with Patient 06/07/13 1519     Chief Complaint  Patient presents with  . URI   (Consider location/radiation/quality/duration/timing/severity/associated sxs/prior Treatment) HPI Comments: 58 year old female developed a cough 2 weeks ago and has experience in wheezing and chills. She has not taken her temperature but believes she may have had a fever. She has no inhalers. She is a 30-pack-year history smoker. She also complains of PND and cough is worse at nighttime.   Past Medical History  Diagnosis Date  . Hypertension   . Arthritis   . Chronic back pain   . Chest pain 2006  . Anxiety    Past Surgical History  Procedure Laterality Date  . Abdominal hysterectomy  1997  . Cardiac catheterization  2006  . Ingrown toenail removal    . Tonsillectomy    . Hemi-microdiscectomy lumbar laminectomy level 1  05/14/2012    Procedure: HEMI-MICRODISCECTOMY LUMBAR LAMINECTOMY LEVEL 1;  Surgeon: Jacki Conesonald A Gioffre, MD;  Location: [facility];  Service: Orthopedics;  Laterality: N/A;  Hemi Laminectomy Microdiscectomy L4 - L5 Central (X-Ray)   No family history on file. History  Substance Use Topics  . Smoking status: Current Every Day Smoker -- 1.00 packs/day for 30 years    Types: Cigarettes  . Smokeless tobacco: Never Used  . Alcohol Use: No   OB History   Grav Para Term Preterm Abortions TAB SAB Ect Mult Living                 Review of Systems  Constitutional: Positive for fever and activity change. Negative for chills, appetite change and fatigue.  HENT: Positive for congestion, postnasal drip and rhinorrhea. Negative for ear pain, facial swelling and sore throat.   Eyes: Negative.   Respiratory: Positive for cough, shortness of breath and wheezing. Negative for chest tightness.   Cardiovascular: Negative.   Gastrointestinal: Negative.   Musculoskeletal: Negative.  Negative for neck pain and neck  stiffness.  Skin: Negative for pallor and rash.  Neurological: Negative.     Allergies  Review of patient's allergies indicates no known allergies.  Home Medications   Current Outpatient Rx  Name  Route  Sig  Dispense  Refill  . albuterol (PROVENTIL HFA;VENTOLIN HFA) 108 (90 BASE) MCG/ACT inhaler   Inhalation   Inhale 2 puffs into the lungs every 4 (four) hours as needed for wheezing or shortness of breath.   1 Inhaler   0   . azithromycin (ZITHROMAX) 250 MG tablet   Oral   Take 1 tablet (250 mg total) by mouth daily. 2 tabs po on day 1, 1 tab po on days 2-5   6 tablet   0   . enalapril-hydrochlorothiazide (VASERETIC) 10-25 MG per tablet   Oral   Take 1 tablet by mouth daily before breakfast.          . enalapril-hydrochlorothiazide (VASERETIC) 10-25 MG per tablet   Oral   Take 1 tablet by mouth daily.   90 tablet   1   . methylPREDNISolone (MEDROL DOSEPAK) 4 MG tablet      follow package directions   21 tablet   0   . oxyCODONE-acetaminophen (PERCOCET/ROXICET) 5-325 MG per tablet   Oral   Take 1 tablet by mouth every 4 (four) hours as needed for pain.         Marland Kitchen. tobramycin (TOBREX) 0.3 % ophthalmic solution  Right Eye   Place 1 drop into the right eye every 6 (six) hours.   5 mL   0    BP 114/73  Pulse 67  Temp(Src) 98.2 F (36.8 C) (Oral)  Resp 18  SpO2 100% Physical Exam  Nursing note and vitals reviewed. Constitutional: She is oriented to person, place, and time. She appears well-developed and well-nourished. No distress.  HENT:  Mouth/Throat: No oropharyngeal exudate.  Bilateral TMs are normal Oropharynx with mild erythema, cobblestoning and PND.  Eyes: Conjunctivae are normal.  Neck: Normal range of motion. Neck supple.  Cardiovascular: Normal rate, regular rhythm and normal heart sounds.   Pulmonary/Chest: Effort normal. No respiratory distress. She has wheezes. She has no rales.  Expiratory wheezing, prolonged expiratory phase.  Coarseness throughout associated with cough. No crackles  Musculoskeletal: Normal range of motion. She exhibits no edema.  Lymphadenopathy:    She has no cervical adenopathy.  Neurological: She is alert and oriented to person, place, and time.  Skin: Skin is warm and dry. No rash noted.  Psychiatric: She has a normal mood and affect.    ED Course  Procedures (including critical care time) Labs Review Labs Reviewed - No data to display Imaging Review No results found.    MDM   1. Bronchitis with bronchospasm   2. Acute rhinosinusitis    Status post DuoNeb the patient states she is feeling better and breathing better. Auscultation reveals significant reduction and wheeze, increased air movement and reduction of cough.  Albuterol HFA 2 puffs q. 4-6 hours when necessary cough and wheeze Z-Pak Medrol Dosepak starting tomorrow May take Alka-Seltzer cold plus nighttime relief at nighttime for cough and drainage Drink plenty of fluids During the day Claritin or Allegra for drainage Recheck promptly for any new symptoms problems or worsening. Stop smoking 3 minutes.  Hayden Rasmussen, NP 06/07/13 1616  Hayden Rasmussen, NP 06/07/13 1616

## 2013-06-08 NOTE — ED Provider Notes (Signed)
Medical screening examination/treatment/procedure(s) were performed by resident physician or non-physician practitioner and as supervising physician I was immediately available for consultation/collaboration.   Kavion Mancinas DOUGLAS MD.   Kaleia Longhi D Dayzee Trower, MD 06/08/13 1753 

## 2013-09-18 ENCOUNTER — Encounter (HOSPITAL_COMMUNITY): Payer: Self-pay | Admitting: Emergency Medicine

## 2013-09-18 ENCOUNTER — Emergency Department (HOSPITAL_COMMUNITY)
Admission: EM | Admit: 2013-09-18 | Discharge: 2013-09-18 | Disposition: A | Discharge status: Home / Self Care | Payer: BC Managed Care – PPO | Source: Home / Self Care

## 2013-09-18 DIAGNOSIS — I10 Essential (primary) hypertension: Secondary | ICD-10-CM

## 2013-09-18 MED ORDER — ENALAPRIL-HYDROCHLOROTHIAZIDE 10-25 MG PO TABS: 1.0000 | ORAL_TABLET | Freq: Every day | ORAL | Status: DC

## 2013-09-18 NOTE — ED Provider Notes (Signed)
Medical screening examination/treatment/procedure(s) were performed by non-physician practitioner and as supervising physician I was immediately available for consultation/collaboration.  Leslee Homeavid Tyna Huertas, M.D.  Reuben Likesavid C Donyell Ding, MD 09/18/13 [phone]

## 2013-09-18 NOTE — ED Notes (Signed)
Reports being out of BP meds for four days.  In the process of finding PCP.   Voices no other concerns at this time.

## 2013-09-18 NOTE — ED Provider Notes (Signed)
CSN: [id number]     Arrival date & time 09/18/13  1305 History   First MD Initiated Contact with Patient 09/18/13 1435     Chief Complaint  Patient presents with  . Medication Refill   (Consider location/radiation/quality/duration/timing/severity/associated sxs/prior Treatment) HPI Comments: 58 y o f with HTN well known to the ED and urgent care for minor complaints and refill requests for chronic medications, BP meds   Past Medical History  Diagnosis Date  . Hypertension   . Arthritis   . Chronic back pain   . Chest pain 2006  . Anxiety    Past Surgical History  Procedure Laterality Date  . Abdominal hysterectomy  1997  . Cardiac catheterization  2006  . Ingrown toenail removal    . Tonsillectomy    . Hemi-microdiscectomy lumbar laminectomy level 1  05/14/2012    Procedure: HEMI-MICRODISCECTOMY LUMBAR LAMINECTOMY LEVEL 1;  Surgeon: Jacki Conesonald A Gioffre, MD;  Location: [facility];  Service: Orthopedics;  Laterality: N/A;  Hemi Laminectomy Microdiscectomy L4 - L5 Central (X-Ray)   History reviewed. No pertinent family history. History  Substance Use Topics  . Smoking status: Current Every Day Smoker -- 1.00 packs/day for 30 years    Types: Cigarettes  . Smokeless tobacco: Never Used  . Alcohol Use: No   OB History   Grav Para Term Preterm Abortions TAB SAB Ect Mult Living                 Review of Systems  Respiratory: Negative.   Cardiovascular: Negative.   Genitourinary: Negative.   All other systems reviewed and are negative.   Allergies  Review of patient's allergies indicates no known allergies.  Home Medications   Prior to Admission medications   Medication Sig Start Date End Date Taking? Authorizing Provider  enalapril-hydrochlorothiazide (VASERETIC) 10-25 MG per tablet Take 1 tablet by mouth daily. 03/21/13  Yes Reuben Likesavid C Keller, MD  albuterol (PROVENTIL HFA;VENTOLIN HFA) 108 (90 BASE) MCG/ACT inhaler Inhale 2 puffs into the lungs every 4 (four) hours as needed  for wheezing or shortness of breath. 06/07/13   Hayden Rasmussenavid Lexxus [place]hill, NP  azithromycin (ZITHROMAX) 250 MG tablet Take 1 tablet (250 mg total) by mouth daily. 2 tabs po on day 1, 1 tab po on days 2-5 06/07/13   Hayden Rasmussenavid Trevelle Mcgurn, NP  enalapril-hydrochlorothiazide (VASERETIC) 10-25 MG per tablet Take 1 tablet by mouth daily before breakfast.     Historical Provider, MD  methylPREDNISolone (MEDROL DOSEPAK) 4 MG tablet follow package directions 06/07/13   Hayden Rasmussenavid Shukri Nistler, NP  oxyCODONE-acetaminophen (PERCOCET/ROXICET) 5-325 MG per tablet Take 1 tablet by mouth every 4 (four) hours as needed for pain.    Historical Provider, MD  tobramycin (TOBREX) 0.3 % ophthalmic solution Place 1 drop into the right eye every 6 (six) hours. 02/02/13   Linna HoffJames D Kindl, MD   BP 137/68  Pulse 61  Temp(Src) 98 F (36.7 C) (Oral)  Resp 16  SpO2 98% Physical Exam  Nursing note and vitals reviewed. Constitutional: She is oriented to person, place, and time. She appears well-developed and well-nourished. No distress.  Pulmonary/Chest: Effort normal. No respiratory distress.  Neurological: She is alert and oriented to person, place, and time.  Skin: Skin is warm.    ED Course  Procedures (including critical care time) Labs Review Labs Reviewed - No data to display  Imaging Review No results found.   MDM   1. HTN (hypertension)    RF enalapril with HCTZ 10/25 #30 with the discussion that  we cannot continue to rx this. Must have  PCP.[facility] is to got back after lunch to establish appt    Hayden Rasmussenavid Denham Mose, NP 09/18/13 1520

## 2013-09-18 NOTE — Discharge Instructions (Signed)

## 2013-10-06 ENCOUNTER — Other Ambulatory Visit: Payer: Self-pay | Admitting: Surgical

## 2013-10-27 NOTE — Patient Instructions (Signed)
[name]  10/27/2013   Your procedure is scheduled on:  11/05/2013  0730am-0930am   Report to [facility].  Follow the Signs to Short Stay Center at       0530     am  Call this number if you have problems the morning of surgery: [phone]   Remember:   Do not eat food or drink liquids after midnight.   Take these medicines the morning of surgery with A SIP OF WATER:    Do not wear jewelry, make-up or nail polish.  Do not wear lotions, powders, or perfumes, deodorant.     Do not shave 48 hours prior to surgery.   Do not bring valuables to the hospital.  Contacts, dentures or bridgework may not be worn into surgery.  Leave suitcase in the car. After surgery it may be brought to your room.  For patients admitted to the hospital, checkout time is 11:00 AM the day of  discharge.    [place] - Preparing for Surgery Before surgery, you can play an important role.  Because skin is not sterile, your skin needs to be as free of germs as possible.  You can reduce the number of germs on your skin by washing with CHG (chlorahexidine gluconate) soap before surgery.  CHG is an antiseptic cleaner which kills germs and bonds with the skin to continue killing germs even after washing. Please DO NOT use if you have an allergy to CHG or antibacterial soaps.  If your skin becomes reddened/irritated stop using the CHG and inform your nurse when you arrive at Short Stay. Do not shave (including legs and underarms) for at least 48 hours prior to the first CHG shower.  You may shave your face/neck. Please follow these instructions carefully:  1.  Shower with CHG Soap the night before surgery and the  morning of Surgery.  2.  If you choose to wash your hair, wash your hair first as usual with your  normal  shampoo.  3.  After you shampoo, rinse your hair and body thoroughly to remove the  shampoo.                           4.  Use CHG as you would any other liquid soap.  You can apply chg  directly  to the skin and wash                       Gently with a scrungie or clean washcloth.  5.  Apply the CHG Soap to your body ONLY FROM THE NECK DOWN.   Do not use on face/ open                           Wound or open sores. Avoid contact with eyes, ears mouth and genitals (private parts).                       Wash face,  Genitals (private parts) with your normal soap.             6.  Wash thoroughly, paying special attention to the area where your surgery  will be performed.  7.  Thoroughly rinse your body with warm water from the neck down.  8.  DO NOT shower/wash with your normal soap after using and rinsing off  the CHG Soap.  9.  Pat yourself dry with a clean towel.            10.  Wear clean pajamas.            11.  Place clean sheets on your bed the night of your first shower and do not  sleep with pets. Day of Surgery : Do not apply any lotions/deodorants the morning of surgery.  Please wear clean clothes to the hospital/surgery center.  FAILURE TO FOLLOW THESE INSTRUCTIONS MAY RESULT IN THE CANCELLATION OF YOUR SURGERY PATIENT SIGNATURE_________________________________  NURSE SIGNATURE__________________________________  ________________________________________________________________________  WHAT IS A BLOOD TRANSFUSION? Blood Transfusion Information  A transfusion is the replacement of blood or some of its parts. Blood is made up of multiple cells which provide different functions.  Red blood cells carry oxygen and are used for blood loss replacement.  White blood cells fight against infection.  Platelets control bleeding.  Plasma helps clot blood.  Other blood products are available for specialized needs, such as hemophilia or other clotting disorders. BEFORE THE TRANSFUSION  Who gives blood for transfusions?   Healthy volunteers who are fully evaluated to make sure their blood is safe. This is blood bank blood. Transfusion therapy is the safest  it has ever been in the practice of medicine. Before blood is taken from a donor, a complete history is taken to make sure that person has no history of diseases nor engages in risky social behavior (examples are intravenous drug use or sexual activity with multiple partners). The donor's travel history is screened to minimize risk of transmitting infections, such as malaria. The donated blood is tested for signs of infectious diseases, such as HIV and hepatitis. The blood is then tested to be sure it is compatible with you in order to minimize the chance of a transfusion reaction. If you or a relative donates blood, this is often done in anticipation of surgery and is not appropriate for emergency situations. It takes many days to process the donated blood. RISKS AND COMPLICATIONS Although transfusion therapy is very safe and saves many lives, the main dangers of transfusion include:   Getting an infectious disease.  Developing a transfusion reaction. This is an allergic reaction to something in the blood you were given. Every precaution is taken to prevent this. The decision to have a blood transfusion has been considered carefully by your caregiver before blood is given. Blood is not given unless the benefits outweigh the risks. AFTER THE TRANSFUSION  Right after receiving a blood transfusion, you will usually feel much better and more energetic. This is especially true if your red blood cells have gotten low (anemic). The transfusion raises the level of the red blood cells which carry oxygen, and this usually causes an energy increase.  The nurse administering the transfusion will monitor you carefully for complications. HOME CARE INSTRUCTIONS  No special instructions are needed after a transfusion. You may find your energy is better. Speak with your caregiver about any limitations on activity for underlying diseases you may have. SEEK MEDICAL CARE IF:   Your condition is not improving after  your transfusion.  You develop redness or irritation at the intravenous (IV) site. SEEK IMMEDIATE MEDICAL CARE IF:  Any of the following symptoms occur over the next 12 hours:  Shaking chills.  You have a temperature by mouth above 102 F (38.9 C), not controlled by medicine.  Chest, back, or muscle pain.  People around you feel you are not acting correctly or  are confused.  Shortness of breath or difficulty breathing.  Dizziness and fainting.  You get a rash or develop hives.  You have a decrease in urine output.  Your urine turns a dark color or changes to pink, red, or brown. Any of the following symptoms occur over the next 10 days:  You have a temperature by mouth above 102 F (38.9 C), not controlled by medicine.  Shortness of breath.  Weakness after normal activity.  The white part of the eye turns yellow (jaundice).  You have a decrease in the amount of urine or are urinating less often.  Your urine turns a dark color or changes to pink, red, or brown. Document Released: 05/11/2000 Document Revised: 08/06/2011 Document Reviewed: 12/29/2007 ExitCare Patient Information 2014 [place].  _______________________________________________________________________  Incentive Spirometer  An incentive spirometer is a tool that can help keep your lungs clear and active. This tool measures how well you are filling your lungs with each breath. Taking long deep breaths may help reverse or decrease the chance of developing breathing (pulmonary) problems (especially infection) following:  A long period of time when you are unable to move or be active. BEFORE THE PROCEDURE   If the spirometer includes an indicator to show your best effort, your nurse or respiratory therapist will set it to a desired goal.  If possible, sit up straight or lean slightly forward. Try not to slouch.  Hold the incentive spirometer in an upright position. INSTRUCTIONS FOR USE  1. Sit on  the edge of your bed if possible, or sit up as far as you can in bed or on a chair. 2. Hold the incentive spirometer in an upright position. 3. Breathe out normally. 4. Place the mouthpiece in your mouth and seal your lips tightly around it. 5. Breathe in slowly and as deeply as possible, raising the piston or the ball toward the top of the column. 6. Hold your breath for 3-5 seconds or for as long as possible. Allow the piston or ball to fall to the bottom of the column. 7. Remove the mouthpiece from your mouth and breathe out normally. 8. Rest for a few seconds and repeat Steps 1 through 7 at least 10 times every 1-2 hours when you are awake. Take your time and take a few normal breaths between deep breaths. 9. The spirometer may include an indicator to show your best effort. Use the indicator as a goal to work toward during each repetition. 10. After each set of 10 deep breaths, practice coughing to be sure your lungs are clear. If you have an incision (the cut made at the time of surgery), support your incision when coughing by placing a pillow or rolled up towels firmly against it. Once you are able to get out of bed, walk around indoors and cough well. You may stop using the incentive spirometer when instructed by your caregiver.  RISKS AND COMPLICATIONS  Take your time so you do not get dizzy or light-headed.  If you are in pain, you may need to take or ask for pain medication before doing incentive spirometry. It is harder to take a deep breath if you are having pain. AFTER USE  Rest and breathe slowly and easily.  It can be helpful to keep track of a log of your progress. Your caregiver can provide you with a simple table to help with this. If you are using the spirometer at home, follow these instructions: [place] IF:  You are having difficultly using the spirometer.  You have trouble using the spirometer as often as instructed.  Your pain medication is not giving  enough relief while using the spirometer.  You develop fever of 100.5 F (38.1 C) or higher. SEEK IMMEDIATE MEDICAL CARE IF:   You cough up bloody sputum that had not been present before.  You develop fever of 102 F (38.9 C) or greater.  You develop worsening pain at or near the incision site. MAKE SURE YOU:   Understand these instructions.  Will watch your condition.  Will get help right away if you are not doing well or get worse. Document Released: 09/24/2006 Document Revised: 08/06/2011 Document Reviewed: 11/25/2006 ExitCare Patient Information 2014 ExitCare, [place].   ________________________________________________________________________    Please read over the following fact sheets that you were given: MRSA Information, coughing and deep breathing exercises, leg exercises

## 2013-10-28 ENCOUNTER — Encounter (HOSPITAL_COMMUNITY): Payer: Self-pay | Admitting: Pharmacy Technician

## 2013-10-28 NOTE — H&P (Signed)
TOTAL HIP ADMISSION H&P  Patient is admitted for left total hip arthroplasty.  Subjective:  Chief Complaint: left hip pain  HPI: [name], 58 y.o. female, has a history of pain and functional disability in the left hip(s) due to arthritis and patient has failed non-surgical conservative treatments for greater than 12 weeks to include NSAID's and/or analgesics, flexibility and strengthening excercises, use of assistive devices and activity modification.  Onset of symptoms was gradual starting 3 years ago with gradually worsening course since that time.The patient noted no past surgery on the left hip(s).  Patient currently rates pain in the left hip at 7 out of 10 with activity. Patient has night pain, worsening of pain with activity and weight bearing, pain that interfers with activities of daily living, pain with passive range of motion and crepitus. Patient has evidence of subchondral cysts, periarticular osteophytes and joint space narrowing by imaging studies. This condition presents safety issues increasing the risk of falls.   There is no current active infection.  Patient Active Problem List   Diagnosis Date Noted  . 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 Hypertension   . Arthritis   . Chronic back pain   . Chest pain 2006  . Anxiety     Past Surgical History  Procedure Laterality Date  . Abdominal hysterectomy  1997  . Cardiac catheterization  2006  . Ingrown toenail removal    . Tonsillectomy    . Hemi-microdiscectomy lumbar laminectomy level 1  05/14/2012    Procedure: HEMI-MICRODISCECTOMY LUMBAR LAMINECTOMY LEVEL 1;  Surgeon: Jacki Conesonald A Gioffre, MD;  Location: [facility];  Service: Orthopedics;  Laterality: N/A;  Hemi Laminectomy Microdiscectomy L4 - L5 Central (X-Ray)     Current outpatient prescriptions albuterol (PROVENTIL HFA;VENTOLIN HFA) 108 (90 BASE) MCG/ACT inhaler, Inhale 2 puffs into  the lungs every 4 (four) hours as needed for wheezing or shortness of breath., Disp: 1 Inhaler, Rfl: 0;   enalapril-hydrochlorothiazide (VASERETIC) 10-25 MG per tablet, Take 1 tablet by mouth daily before breakfast. , Disp: , Rfl:  Multiple Vitamin (MULTIVITAMIN WITH MINERALS) TABS tablet, Take 1 tablet by mouth daily., Disp: , Rfl: ;  Multiple Vitamins-Minerals (HAIR/SKIN/NAILS PO), Take 2 tablets by mouth daily., Disp: , Rfl: ;   NEOMYCIN-POLYMYXIN-HYDROCORTISONE (CORTISPORIN) 1 % SOLN otic solution, Place 3 drops into the left ear every 8 (eight) hours., Disp: , Rfl: ;   omeprazole (PRILOSEC) 20 MG capsule, Take 20 mg by mouth daily., Disp: , Rfl:  oxyCODONE-acetaminophen (PERCOCET) 10-325 MG per tablet, Take 1 tablet by mouth every 4 (four) hours as needed for pain., Disp: , Rfl: ;   zolpidem (AMBIEN) 10 MG tablet, Take 10 mg by mouth at bedtime as needed for sleep., Disp: , Rfl:   No Known Allergies  History  Substance Use Topics  . Smoking status: Current Every Day Smoker -- 1.00 packs/day for 30 years    Types: Cigarettes  . Smokeless tobacco: Never Used  . Alcohol Use: No     Review of Systems  Constitutional: Negative.   HENT: Negative.   Eyes: Negative.   Respiratory: Positive for shortness of breath. Negative for cough, hemoptysis, sputum production and wheezing.        SOB with exertion  Cardiovascular: Negative.   Gastrointestinal: Negative.   Genitourinary: Negative.   Musculoskeletal: Positive for back pain and joint pain. Negative for falls, myalgias and neck pain.  Left hip pain  Skin: Negative.   Neurological: Negative.   Endo/Heme/Allergies: Negative.   Psychiatric/Behavioral: Negative.     Objective:  Physical Exam  Constitutional: She is oriented to person, place, and time. She appears well-developed and well-nourished. No distress.  HENT:  Head: Normocephalic and atraumatic.  Right Ear: External ear normal.  Nose: Nose normal.  Mouth/Throat:  Oropharynx is clear and moist.  Eyes: Conjunctivae and EOM are normal.  Neck: Normal range of motion. Neck supple.  Cardiovascular: Normal rate, regular rhythm, normal heart sounds and intact distal pulses.   No murmur heard. Respiratory: Effort normal and breath sounds normal. No respiratory distress. She has no wheezes.  GI: Soft. Bowel sounds are normal. She exhibits no distension. There is no tenderness.  Musculoskeletal:       Right hip: Normal.       Left hip: She exhibits decreased range of motion, decreased strength and crepitus.       Right knee: Normal.       Left knee: Normal.       Right lower leg: She exhibits no tenderness and no swelling.       Left lower leg: She exhibits no tenderness and no swelling.  She has a painful limited range of motion of her left hip. She has a moderately severe antalgic gait on the left.  Neurological: She is alert and oriented to person, place, and time. She has normal strength and normal reflexes. No sensory deficit.  Skin: No rash noted. She is not diaphoretic. No erythema.  Psychiatric: She has a normal mood and affect. Her behavior is normal.    Vitals Weight: 135 lb Height: 61 in Body Surface Area: 1.62 m Body Mass Index: 25.51 kg/m Pulse: 72 (Regular) BP: 140/80 (Sitting, Left Arm, Standard  Imaging Review Plain radiographs demonstrate severe degenerative joint disease of the left hip(s). The bone quality appears to be good for age and reported activity level.  Assessment/Plan:  End stage arthritis, left hip(s)  The patient history, physical examination, clinical judgement of the provider and imaging studies are consistent with end stage degenerative joint disease of the left hip(s) and total hip arthroplasty is deemed medically necessary. The treatment options including medical management, injection therapy, arthroscopy and arthroplasty were discussed at length. The risks and benefits of total hip arthroplasty were  presented and reviewed. The risks due to aseptic loosening, infection, stiffness, dislocation/subluxation,  thromboembolic complications and other imponderables were discussed.  The patient acknowledged the explanation, agreed to proceed with the plan and consent was signed. Patient is being admitted for inpatient treatment for surgery, pain control, PT, OT, prophylactic antibiotics, VTE prophylaxis, progressive ambulation and ADL's and discharge planning.The patient is planning to be discharged to skilled nursing facility     [place], [place]

## 2013-10-29 ENCOUNTER — Encounter (HOSPITAL_COMMUNITY): Payer: Self-pay

## 2013-10-29 ENCOUNTER — Encounter (HOSPITAL_COMMUNITY)
Admission: RE | Admit: 2013-10-29 | Discharge: 2013-10-29 | Disposition: A | Discharge status: Home / Self Care | Payer: BC Managed Care – PPO | Source: Ambulatory Visit | Attending: Orthopedic Surgery | Admitting: Orthopedic Surgery

## 2013-10-29 ENCOUNTER — Ambulatory Visit (HOSPITAL_COMMUNITY)
Admission: RE | Admit: 2013-10-29 | Discharge: 2013-10-29 | Disposition: A | Discharge status: Home / Self Care | Payer: BC Managed Care – PPO | Source: Ambulatory Visit | Attending: Surgical | Admitting: Surgical

## 2013-10-29 DIAGNOSIS — Z01818 Encounter for other preprocedural examination: Secondary | ICD-10-CM | POA: Insufficient documentation

## 2013-10-29 DIAGNOSIS — Z01812 Encounter for preprocedural laboratory examination: Secondary | ICD-10-CM | POA: Insufficient documentation

## 2013-10-29 DIAGNOSIS — Z01810 Encounter for preprocedural cardiovascular examination: Secondary | ICD-10-CM | POA: Insufficient documentation

## 2013-10-29 HISTORY — DX: Shortness of breath: R06.02

## 2013-10-29 HISTORY — DX: Unspecified asthma, uncomplicated: J45.909

## 2013-10-29 LAB — CBC
HEMATOCRIT: 39.2 % (ref 36.0–46.0)
HEMOGLOBIN: 12.7 g/dL (ref 12.0–15.0)
MCH: 29.3 pg (ref 26.0–34.0)
MCHC: 32.4 g/dL (ref 30.0–36.0)
MCV: 90.3 fL (ref 78.0–100.0)
PLATELETS: 340 10*3/uL (ref 150–400)
RBC: 4.34 MIL/uL (ref 3.87–5.11)
RDW: 14.4 % (ref 11.5–15.5)
WBC: 5.2 10*3/uL (ref 4.0–10.5)

## 2013-10-29 LAB — COMPREHENSIVE METABOLIC PANEL
ALT: 8 U/L (ref 0–35)
AST: 15 U/L (ref 0–37)
Albumin: 3.7 g/dL (ref 3.5–5.2)
Alkaline Phosphatase: 65 U/L (ref 39–117)
BUN: 10 mg/dL (ref 6–23)
CO2: 32 mEq/L (ref 19–32)
Calcium: 9.3 mg/dL (ref 8.4–10.5)
Chloride: 103 mEq/L (ref 96–112)
Creatinine, Ser: 0.7 mg/dL (ref 0.50–1.10)
GFR calc Af Amer: >90 mL/min (ref >90)
GFR calc non Af Amer: >90 mL/min (ref >90)
Glucose, Bld: 69 mg/dL — ABNORMAL LOW (ref 70–99)
Potassium: 4.2 mEq/L (ref 3.7–5.3)
Sodium: 142 mEq/L (ref 137–147)
Total Bilirubin: 0.3 mg/dL (ref 0.3–1.2)
Total Protein: 6.7 g/dL (ref 6.0–8.3)

## 2013-10-29 LAB — URINALYSIS, ROUTINE W REFLEX MICROSCOPIC
Bilirubin Urine: NEGATIVE
Glucose, UA: NEGATIVE mg/dL
Hgb urine dipstick: NEGATIVE
Ketones, ur: NEGATIVE mg/dL
Leukocytes, UA: NEGATIVE
Nitrite: NEGATIVE
Protein, ur: NEGATIVE mg/dL
Specific Gravity, Urine: 1.006 (ref 1.005–1.030)
Urobilinogen, UA: 0.2 mg/dL (ref 0.0–1.0)
pH: 5 (ref 5.0–8.0)

## 2013-10-29 LAB — ABO/RH: ABO/RH(D): O POS

## 2013-10-29 LAB — PROTIME-INR
INR: 0.94 (ref 0.00–1.49)
Prothrombin Time: 12.4 seconds (ref 11.6–15.2)

## 2013-10-29 LAB — SURGICAL PCR SCREEN
MRSA, PCR: NEGATIVE
Staphylococcus aureus: NEGATIVE

## 2013-10-29 LAB — APTT: aPTT: 31 seconds (ref 24–37)

## 2013-10-29 NOTE — Progress Notes (Signed)
Glucose 69 on preop labs.  Called patient and left her a message to call the nurse back.

## 2013-10-29 NOTE — Progress Notes (Signed)
Patient returned call and verified she had eaten after preop appointment.  Patient voiced no complaints.  Routed CMP to Dr [name] via [facility] with note that patient had eaten after preop appointment.

## 2013-11-04 NOTE — Anesthesia Preprocedure Evaluation (Addendum)
Anesthesia Evaluation  Patient identified by MRN, date of birth, ID band Patient awake    Reviewed: Allergy & Precautions, H&P , NPO status , Patient's Chart, lab work & pertinent test results  Airway Mallampati: II TM Distance: >3 FB Neck ROM: full    Dental  (+) Missing, Dental Advisory Given, Chipped, Poor Dentition Huge break right front upper and chip left front upper.  Missing lateral front upper incissors.:   Pulmonary shortness of breath and with exertion, asthma , Current Smoker,  breath sounds clear to auscultation  Pulmonary exam normal       Cardiovascular hypertension, Pt. on medications Rhythm:regular Rate:Normal     Neuro/Psych Back surgery negative neurological ROS  negative psych ROS   GI/Hepatic negative GI ROS, Neg liver ROS,   Endo/Other  negative endocrine ROS  Renal/GU negative Renal ROS  negative genitourinary   Musculoskeletal   Abdominal   Peds  Hematology negative hematology ROS (+)   Anesthesia Other Findings   Reproductive/Obstetrics negative OB ROS                          Anesthesia Physical Anesthesia Plan  ASA: III  Anesthesia Plan: General   Post-op Pain Management:    Induction: Intravenous  Airway Management Planned: Oral ETT  Additional Equipment:   Intra-op Plan:   Post-operative Plan: Extubation in OR  Informed Consent: I have reviewed the patients History and Physical, chart, labs and discussed the procedure including the risks, benefits and alternatives for the proposed anesthesia with the patient or authorized representative who has indicated his/her understanding and acceptance.   Dental Advisory Given  Plan Discussed with: CRNA and Surgeon  Anesthesia Plan Comments:         Anesthesia Quick Evaluation

## 2013-11-05 ENCOUNTER — Inpatient Hospital Stay (HOSPITAL_COMMUNITY): Payer: BC Managed Care – PPO | Admitting: Anesthesiology

## 2013-11-05 ENCOUNTER — Encounter (HOSPITAL_COMMUNITY)
Admission: RE | Disposition: A | Discharge status: Skilled Nursing Facility | Payer: Self-pay | Source: Ambulatory Visit | Attending: Orthopedic Surgery

## 2013-11-05 ENCOUNTER — Inpatient Hospital Stay (HOSPITAL_COMMUNITY)
Admission: RE | Admit: 2013-11-05 | Discharge: 2013-11-09 | DRG: 470 | Disposition: A | Discharge status: Skilled Nursing Facility | Payer: BC Managed Care – PPO | Source: Ambulatory Visit | Attending: Orthopedic Surgery | Admitting: Orthopedic Surgery

## 2013-11-05 ENCOUNTER — Encounter (HOSPITAL_COMMUNITY): Payer: Self-pay

## 2013-11-05 ENCOUNTER — Inpatient Hospital Stay (HOSPITAL_COMMUNITY): Payer: BC Managed Care – PPO

## 2013-11-05 ENCOUNTER — Encounter (HOSPITAL_COMMUNITY): Payer: BC Managed Care – PPO | Admitting: Anesthesiology

## 2013-11-05 DIAGNOSIS — E876 Hypokalemia: Secondary | ICD-10-CM | POA: Diagnosis not present

## 2013-11-05 DIAGNOSIS — M1610 Unilateral primary osteoarthritis, unspecified hip: Principal | ICD-10-CM | POA: Diagnosis present

## 2013-11-05 DIAGNOSIS — G8929 Other chronic pain: Secondary | ICD-10-CM | POA: Diagnosis present

## 2013-11-05 DIAGNOSIS — R0602 Shortness of breath: Secondary | ICD-10-CM | POA: Diagnosis present

## 2013-11-05 DIAGNOSIS — M549 Dorsalgia, unspecified: Secondary | ICD-10-CM | POA: Diagnosis present

## 2013-11-05 DIAGNOSIS — M169 Osteoarthritis of hip, unspecified: Principal | ICD-10-CM | POA: Diagnosis present

## 2013-11-05 DIAGNOSIS — J45909 Unspecified asthma, uncomplicated: Secondary | ICD-10-CM | POA: Diagnosis present

## 2013-11-05 DIAGNOSIS — F411 Generalized anxiety disorder: Secondary | ICD-10-CM | POA: Diagnosis present

## 2013-11-05 DIAGNOSIS — D62 Acute posthemorrhagic anemia: Secondary | ICD-10-CM | POA: Diagnosis not present

## 2013-11-05 DIAGNOSIS — I10 Essential (primary) hypertension: Secondary | ICD-10-CM | POA: Diagnosis present

## 2013-11-05 DIAGNOSIS — F17200 Nicotine dependence, unspecified, uncomplicated: Secondary | ICD-10-CM | POA: Diagnosis present

## 2013-11-05 DIAGNOSIS — M1612 Unilateral primary osteoarthritis, left hip: Secondary | ICD-10-CM

## 2013-11-05 DIAGNOSIS — Z96642 Presence of left artificial hip joint: Secondary | ICD-10-CM

## 2013-11-05 DIAGNOSIS — Z79899 Other long term (current) drug therapy: Secondary | ICD-10-CM

## 2013-11-05 HISTORY — PX: TOTAL HIP ARTHROPLASTY: SHX124

## 2013-11-05 SURGERY — ARTHROPLASTY, HIP, TOTAL,POSTERIOR APPROACH
Anesthesia: General | Site: Hip | Laterality: Left

## 2013-11-05 MED ORDER — FERROUS SULFATE 325 (65 FE) MG PO TABS
325.0000 mg | ORAL_TABLET | Freq: Three times a day (TID) | ORAL | Status: DC
Start: 2013-11-05 — End: 2013-11-09
  Administered 2013-11-06 – 2013-11-09 (×9): 325 mg via ORAL
  Filled 2013-11-05 (×14): qty 1

## 2013-11-05 MED ORDER — CEFAZOLIN SODIUM 1-5 GM-% IV SOLN
1.0000 g | Freq: Four times a day (QID) | INTRAVENOUS | Status: AC
  Administered 2013-11-05 (×2): 1 g via INTRAVENOUS
  Filled 2013-11-05 (×2): qty 50

## 2013-11-05 MED ORDER — MIDAZOLAM HCL 5 MG/5ML IJ SOLN
INTRAMUSCULAR | Status: DC | PRN
  Administered 2013-11-05: 2 mg via INTRAVENOUS

## 2013-11-05 MED ORDER — HYDROMORPHONE HCL PF 1 MG/ML IJ SOLN
INTRAMUSCULAR | Status: AC
  Administered 2013-11-05: 0.5 mg via INTRAVENOUS
  Filled 2013-11-05: qty 1

## 2013-11-05 MED ORDER — ACETAMINOPHEN 650 MG RE SUPP: 650.0000 mg | Freq: Four times a day (QID) | RECTAL | Status: DC | PRN

## 2013-11-05 MED ORDER — CHLORHEXIDINE GLUCONATE 4 % EX LIQD: 60.0000 mL | Freq: Once | CUTANEOUS | Status: DC

## 2013-11-05 MED ORDER — CELECOXIB 200 MG PO CAPS
200.0000 mg | ORAL_CAPSULE | Freq: Two times a day (BID) | ORAL | Status: DC
  Administered 2013-11-05 – 2013-11-09 (×7): 200 mg via ORAL
  Filled 2013-11-05 (×9): qty 1

## 2013-11-05 MED ORDER — HYDROMORPHONE HCL PF 1 MG/ML IJ SOLN
INTRAMUSCULAR | Status: AC
  Filled 2013-11-05: qty 1

## 2013-11-05 MED ORDER — ONDANSETRON HCL 4 MG/2ML IJ SOLN
INTRAMUSCULAR | Status: DC | PRN
  Administered 2013-11-05: 4 mg via INTRAVENOUS

## 2013-11-05 MED ORDER — DEXAMETHASONE SODIUM PHOSPHATE 10 MG/ML IJ SOLN
INTRAMUSCULAR | Status: AC
  Filled 2013-11-05: qty 1

## 2013-11-05 MED ORDER — LACTATED RINGERS IV SOLN
INTRAVENOUS | Status: DC
  Administered 2013-11-05 (×2): via INTRAVENOUS

## 2013-11-05 MED ORDER — NEOSTIGMINE METHYLSULFATE 10 MG/10ML IV SOLN
INTRAVENOUS | Status: DC | PRN
  Administered 2013-11-05: 3 mg via INTRAVENOUS

## 2013-11-05 MED ORDER — SUCCINYLCHOLINE CHLORIDE 20 MG/ML IJ SOLN
INTRAMUSCULAR | Status: DC | PRN
  Administered 2013-11-05: 100 mg via INTRAVENOUS

## 2013-11-05 MED ORDER — BUPIVACAINE LIPOSOME 1.3 % IJ SUSP
20.0000 mL | Freq: Once | INTRAMUSCULAR | Status: AC
  Administered 2013-11-05: 20 mL
  Filled 2013-11-05: qty 20

## 2013-11-05 MED ORDER — HYDROCHLOROTHIAZIDE 25 MG PO TABS
25.0000 mg | ORAL_TABLET | Freq: Every day | ORAL | Status: DC
  Administered 2013-11-05 – 2013-11-08 (×2): 25 mg via ORAL
  Filled 2013-11-05 (×5): qty 1

## 2013-11-05 MED ORDER — LACTATED RINGERS IV SOLN: INTRAVENOUS | Status: DC

## 2013-11-05 MED ORDER — NEOSTIGMINE METHYLSULFATE 10 MG/10ML IV SOLN
INTRAVENOUS | Status: AC
  Filled 2013-11-05: qty 1

## 2013-11-05 MED ORDER — LIDOCAINE HCL (CARDIAC) 20 MG/ML IV SOLN
INTRAVENOUS | Status: AC
  Filled 2013-11-05: qty 5

## 2013-11-05 MED ORDER — HYDROCODONE-ACETAMINOPHEN 5-325 MG PO TABS
1.0000 | ORAL_TABLET | ORAL | Status: DC | PRN
Start: 2013-11-05 — End: 2013-11-09

## 2013-11-05 MED ORDER — ALBUTEROL SULFATE (2.5 MG/3ML) 0.083% IN NEBU: 2.5000 mg | INHALATION_SOLUTION | RESPIRATORY_TRACT | Status: DC | PRN

## 2013-11-05 MED ORDER — DEXAMETHASONE SODIUM PHOSPHATE 10 MG/ML IJ SOLN
INTRAMUSCULAR | Status: DC | PRN
  Administered 2013-11-05: 10 mg via INTRAVENOUS

## 2013-11-05 MED ORDER — FENTANYL CITRATE 0.05 MG/ML IJ SOLN
INTRAMUSCULAR | Status: AC
  Filled 2013-11-05: qty 2

## 2013-11-05 MED ORDER — OXYCODONE-ACETAMINOPHEN 5-325 MG PO TABS
2.0000 | ORAL_TABLET | ORAL | Status: DC | PRN
  Administered 2013-11-05 – 2013-11-09 (×18): 2 via ORAL
  Filled 2013-11-05 (×19): qty 2

## 2013-11-05 MED ORDER — ACETAMINOPHEN 325 MG PO TABS: 650.0000 mg | ORAL_TABLET | Freq: Four times a day (QID) | ORAL | Status: DC | PRN

## 2013-11-05 MED ORDER — HYDROMORPHONE HCL PF 1 MG/ML IJ SOLN
0.2500 mg | INTRAMUSCULAR | Status: DC | PRN
  Administered 2013-11-05 (×3): 0.5 mg via INTRAVENOUS
  Administered 2013-11-05: 0.25 mg via INTRAVENOUS

## 2013-11-05 MED ORDER — ALBUTEROL SULFATE HFA 108 (90 BASE) MCG/ACT IN AERS: 2.0000 | INHALATION_SPRAY | RESPIRATORY_TRACT | Status: DC | PRN

## 2013-11-05 MED ORDER — ONDANSETRON HCL 4 MG PO TABS: 4.0000 mg | ORAL_TABLET | Freq: Four times a day (QID) | ORAL | Status: DC | PRN

## 2013-11-05 MED ORDER — LACTATED RINGERS IV SOLN
INTRAVENOUS | Status: DC
  Administered 2013-11-05: 21:00:00 via INTRAVENOUS
  Administered 2013-11-05 – 2013-11-06 (×2): 100 mL/h via INTRAVENOUS

## 2013-11-05 MED ORDER — CEFAZOLIN SODIUM-DEXTROSE 2-3 GM-% IV SOLR
2.0000 g | INTRAVENOUS | Status: AC
  Administered 2013-11-05: 2 g via INTRAVENOUS

## 2013-11-05 MED ORDER — METHOCARBAMOL 500 MG PO TABS
500.0000 mg | ORAL_TABLET | Freq: Four times a day (QID) | ORAL | Status: DC | PRN
  Administered 2013-11-05 – 2013-11-09 (×10): 500 mg via ORAL
  Filled 2013-11-05 (×10): qty 1

## 2013-11-05 MED ORDER — ENALAPRIL MALEATE 10 MG PO TABS
10.0000 mg | ORAL_TABLET | Freq: Every day | ORAL | Status: DC
  Administered 2013-11-05 – 2013-11-08 (×2): 10 mg via ORAL
  Filled 2013-11-05 (×5): qty 1

## 2013-11-05 MED ORDER — PHENOL 1.4 % MT LIQD
1.0000 | OROMUCOSAL | Status: DC | PRN
Start: 2013-11-05 — End: 2013-11-09

## 2013-11-05 MED ORDER — ENALAPRIL-HYDROCHLOROTHIAZIDE 10-25 MG PO TABS: 1.0000 | ORAL_TABLET | Freq: Every day | ORAL | Status: DC

## 2013-11-05 MED ORDER — GLYCOPYRROLATE 0.2 MG/ML IJ SOLN
INTRAMUSCULAR | Status: DC | PRN
  Administered 2013-11-05: 0.4 mg via INTRAVENOUS

## 2013-11-05 MED ORDER — GLYCOPYRROLATE 0.2 MG/ML IJ SOLN
INTRAMUSCULAR | Status: AC
  Filled 2013-11-05: qty 2

## 2013-11-05 MED ORDER — ALUM & MAG HYDROXIDE-SIMETH 200-200-20 MG/5ML PO SUSP: 30.0000 mL | ORAL | Status: DC | PRN

## 2013-11-05 MED ORDER — LIDOCAINE HCL (CARDIAC) 20 MG/ML IV SOLN
INTRAVENOUS | Status: DC | PRN
  Administered 2013-11-05: 50 mg via INTRAVENOUS

## 2013-11-05 MED ORDER — HYDROMORPHONE HCL PF 1 MG/ML IJ SOLN
1.0000 mg | INTRAMUSCULAR | Status: DC | PRN
  Administered 2013-11-05 (×2): 0.5 mg via INTRAVENOUS
  Administered 2013-11-08: 1 mg via INTRAVENOUS
  Filled 2013-11-05 (×2): qty 1

## 2013-11-05 MED ORDER — POLYETHYLENE GLYCOL 3350 17 G PO PACK: 17.0000 g | PACK | Freq: Every day | ORAL | Status: DC | PRN

## 2013-11-05 MED ORDER — FENTANYL CITRATE 0.05 MG/ML IJ SOLN
INTRAMUSCULAR | Status: DC | PRN
  Administered 2013-11-05: 50 ug via INTRAVENOUS
  Administered 2013-11-05: 100 ug via INTRAVENOUS
  Administered 2013-11-05 (×4): 50 ug via INTRAVENOUS

## 2013-11-05 MED ORDER — BACITRACIN-NEOMYCIN-POLYMYXIN 400-5-5000 EX OINT
TOPICAL_OINTMENT | CUTANEOUS | Status: AC
  Filled 2013-11-05: qty 1

## 2013-11-05 MED ORDER — SODIUM CHLORIDE 0.9 % IR SOLN
Status: DC | PRN
  Administered 2013-11-05: 08:00:00

## 2013-11-05 MED ORDER — PANTOPRAZOLE SODIUM 40 MG PO TBEC
40.0000 mg | DELAYED_RELEASE_TABLET | Freq: Every day | ORAL | Status: DC
  Administered 2013-11-05 – 2013-11-09 (×5): 40 mg via ORAL
  Filled 2013-11-05 (×5): qty 1

## 2013-11-05 MED ORDER — METOCLOPRAMIDE HCL 5 MG/ML IJ SOLN: 5.0000 mg | Freq: Three times a day (TID) | INTRAMUSCULAR | Status: DC | PRN

## 2013-11-05 MED ORDER — ROCURONIUM BROMIDE 100 MG/10ML IV SOLN
INTRAVENOUS | Status: DC | PRN
  Administered 2013-11-05: 5 mg via INTRAVENOUS
  Administered 2013-11-05: 25 mg via INTRAVENOUS

## 2013-11-05 MED ORDER — ONDANSETRON HCL 4 MG/2ML IJ SOLN
INTRAMUSCULAR | Status: AC
  Filled 2013-11-05: qty 2

## 2013-11-05 MED ORDER — ONDANSETRON HCL 4 MG/2ML IJ SOLN: 4.0000 mg | Freq: Four times a day (QID) | INTRAMUSCULAR | Status: DC | PRN

## 2013-11-05 MED ORDER — CEFAZOLIN SODIUM-DEXTROSE 2-3 GM-% IV SOLR
INTRAVENOUS | Status: AC
  Filled 2013-11-05: qty 50

## 2013-11-05 MED ORDER — NICOTINE 14 MG/24HR TD PT24
14.0000 mg | MEDICATED_PATCH | Freq: Every day | TRANSDERMAL | Status: DC
  Administered 2013-11-05 – 2013-11-09 (×6): 14 mg via TRANSDERMAL
  Filled 2013-11-05 (×7): qty 1

## 2013-11-05 MED ORDER — ROCURONIUM BROMIDE 100 MG/10ML IV SOLN
INTRAVENOUS | Status: AC
  Filled 2013-11-05: qty 1

## 2013-11-05 MED ORDER — PROPOFOL 10 MG/ML IV BOLUS
INTRAVENOUS | Status: DC | PRN
  Administered 2013-11-05: 180 mg via INTRAVENOUS

## 2013-11-05 MED ORDER — ACETAMINOPHEN 10 MG/ML IV SOLN
1000.0000 mg | Freq: Once | INTRAVENOUS | Status: AC
  Administered 2013-11-05: 1000 mg via INTRAVENOUS
  Filled 2013-11-05: qty 100

## 2013-11-05 MED ORDER — FENTANYL CITRATE 0.05 MG/ML IJ SOLN
INTRAMUSCULAR | Status: AC
  Filled 2013-11-05: qty 5

## 2013-11-05 MED ORDER — MENTHOL 3 MG MT LOZG: 1.0000 | LOZENGE | OROMUCOSAL | Status: DC | PRN

## 2013-11-05 MED ORDER — THROMBIN 5000 UNITS EX SOLR
CUTANEOUS | Status: AC
  Filled 2013-11-05: qty 10000

## 2013-11-05 MED ORDER — PROPOFOL 10 MG/ML IV BOLUS
INTRAVENOUS | Status: AC
  Filled 2013-11-05: qty 20

## 2013-11-05 MED ORDER — MIDAZOLAM HCL 2 MG/2ML IJ SOLN
INTRAMUSCULAR | Status: AC
  Filled 2013-11-05: qty 2

## 2013-11-05 MED ORDER — METHOCARBAMOL 1000 MG/10ML IJ SOLN
500.0000 mg | Freq: Four times a day (QID) | INTRAVENOUS | Status: DC | PRN
  Administered 2013-11-05: 500 mg via INTRAVENOUS
  Filled 2013-11-05: qty 5

## 2013-11-05 MED ORDER — RIVAROXABAN 10 MG PO TABS
10.0000 mg | ORAL_TABLET | Freq: Every day | ORAL | Status: DC
  Administered 2013-11-06 – 2013-11-09 (×4): 10 mg via ORAL
  Filled 2013-11-05 (×5): qty 1

## 2013-11-05 MED ORDER — THROMBIN 5000 UNITS EX SOLR
CUTANEOUS | Status: DC | PRN
  Administered 2013-11-05: 10000 [IU] via TOPICAL

## 2013-11-05 MED ORDER — BISACODYL 5 MG PO TBEC
5.0000 mg | DELAYED_RELEASE_TABLET | Freq: Every day | ORAL | Status: DC | PRN
  Administered 2013-11-07: 5 mg via ORAL
  Filled 2013-11-05: qty 1

## 2013-11-05 MED ORDER — ZOLPIDEM TARTRATE 5 MG PO TABS
5.0000 mg | ORAL_TABLET | Freq: Every evening | ORAL | Status: DC | PRN
  Administered 2013-11-07 – 2013-11-08 (×2): 5 mg via ORAL
  Filled 2013-11-05 (×2): qty 1

## 2013-11-05 MED ORDER — SODIUM CHLORIDE 0.9 % IJ SOLN
INTRAMUSCULAR | Status: AC
  Filled 2013-11-05: qty 50

## 2013-11-05 SURGICAL SUPPLY — 55 items
BAG ZIPLOCK 12X15 (MISCELLANEOUS) ×3 IMPLANT
BLADE SAW SAG 73X25 THK (BLADE) ×2
BLADE SAW SGTL 73X25 THK (BLADE) ×1 IMPLANT
CAPT HIP PF COP ×3 IMPLANT
DERMABOND ADVANCED (GAUZE/BANDAGES/DRESSINGS) ×2
DERMABOND ADVANCED .7 DNX12 (GAUZE/BANDAGES/DRESSINGS) ×1 IMPLANT
DRAPE INCISE IOBAN 85X60 (DRAPES) ×3 IMPLANT
DRAPE ORTHO SPLIT 77X108 STRL (DRAPES) ×4
DRAPE POUCH INSTRU U-SHP 10X18 (DRAPES) ×3 IMPLANT
DRAPE SURG 17X11 SM STRL (DRAPES) ×3 IMPLANT
DRAPE SURG ORHT 6 SPLT 77X108 (DRAPES) ×2 IMPLANT
DRAPE U-SHAPE 47X51 STRL (DRAPES) ×3 IMPLANT
DRSG ADAPTIC 3X8 NADH LF (GAUZE/BANDAGES/DRESSINGS) IMPLANT
DRSG AQUACEL AG ADV 3.5X10 (GAUZE/BANDAGES/DRESSINGS) ×3 IMPLANT
DRSG AQUACEL AG ADV 3.5X14 (GAUZE/BANDAGES/DRESSINGS) ×3 IMPLANT
DRSG PAD ABDOMINAL 8X10 ST (GAUZE/BANDAGES/DRESSINGS) IMPLANT
DRSG TEGADERM 4X4.75 (GAUZE/BANDAGES/DRESSINGS) IMPLANT
DURAPREP 26ML APPLICATOR (WOUND CARE) ×3 IMPLANT
ELECT BLADE TIP CTD 4 INCH (ELECTRODE) ×3 IMPLANT
ELECT REM PT RETURN 9FT ADLT (ELECTROSURGICAL) ×3
ELECTRODE REM PT RTRN 9FT ADLT (ELECTROSURGICAL) ×1 IMPLANT
EVACUATOR 1/8 PVC DRAIN (DRAIN) IMPLANT
FACESHIELD WRAPAROUND (MASK) ×12 IMPLANT
GAUZE SPONGE 2X2 8PLY STRL LF (GAUZE/BANDAGES/DRESSINGS) IMPLANT
GAUZE SPONGE 4X4 12PLY STRL (GAUZE/BANDAGES/DRESSINGS) IMPLANT
GLOVE BIOGEL PI IND STRL 8 (GLOVE) ×1 IMPLANT
GLOVE BIOGEL PI INDICATOR 8 (GLOVE) ×2
GLOVE ECLIPSE 8.0 STRL XLNG CF (GLOVE) ×6 IMPLANT
GLOVE SURG SS PI 6.5 STRL IVOR (GLOVE) IMPLANT
GOWN STRL REUS W/TWL LRG LVL3 (GOWN DISPOSABLE) ×3 IMPLANT
GOWN STRL REUS W/TWL XL LVL3 (GOWN DISPOSABLE) ×3 IMPLANT
IMMOBILIZER KNEE 20 (SOFTGOODS) ×3
IMMOBILIZER KNEE 20 THIGH 36 (SOFTGOODS) ×1 IMPLANT
KIT BASIN OR (CUSTOM PROCEDURE TRAY) ×3 IMPLANT
MANIFOLD NEPTUNE II (INSTRUMENTS) ×3 IMPLANT
NEEDLE HYPO 22GX1.5 SAFETY (NEEDLE) ×3 IMPLANT
PACK TOTAL JOINT (CUSTOM PROCEDURE TRAY) ×3 IMPLANT
POSITIONER SURGICAL ARM (MISCELLANEOUS) ×3 IMPLANT
SPONGE GAUZE 2X2 STER 10/PKG (GAUZE/BANDAGES/DRESSINGS)
SPONGE LAP 18X18 X RAY DECT (DISPOSABLE) IMPLANT
SPONGE LAP 4X18 X RAY DECT (DISPOSABLE) ×3 IMPLANT
SPONGE SURGIFOAM ABS GEL 100 (HEMOSTASIS) ×3 IMPLANT
STAPLER VISISTAT 35W (STAPLE) IMPLANT
SUCTION FRAZIER TIP 10 FR DISP (SUCTIONS) ×3 IMPLANT
SUT MNCRL AB 4-0 PS2 18 (SUTURE) ×3 IMPLANT
SUT VIC AB 0 CT1 27 (SUTURE)
SUT VIC AB 0 CT1 27XBRD ANTBC (SUTURE) IMPLANT
SUT VIC AB 1 CT1 27 (SUTURE) ×8
SUT VIC AB 1 CT1 27XBRD ANTBC (SUTURE) ×4 IMPLANT
SUT VIC AB 2-0 CT1 27 (SUTURE)
SUT VIC AB 2-0 CT1 TAPERPNT 27 (SUTURE) IMPLANT
SYR 20CC LL (SYRINGE) ×3 IMPLANT
TOWEL OR 17X26 10 PK STRL BLUE (TOWEL DISPOSABLE) ×6 IMPLANT
TRAY FOLEY CATH 14FRSI W/METER (CATHETERS) ×3 IMPLANT
WATER STERILE IRR 1500ML POUR (IV SOLUTION) ×3 IMPLANT

## 2013-11-05 NOTE — Anesthesia Postprocedure Evaluation (Signed)
  Anesthesia Post-op Note  Patient: [name]  Procedure(s) Performed: Procedure(s) (LRB): LEFT TOTAL HIP ARTHROPLASTY (Left)  Patient Location: PACU  Anesthesia Type: General  Level of Consciousness: awake and alert   Airway and Oxygen Therapy: Patient Spontanous Breathing  Post-op Pain: mild  Post-op Assessment: Post-op Vital signs reviewed, Patient's Cardiovascular Status Stable, Respiratory Function Stable, Patent Airway and No signs of Nausea or vomiting  Last Vitals:  Filed Vitals:   11/05/13 1341  BP: 111/76  Pulse: 60  Temp: 37.1 C  Resp: 14    Post-op Vital Signs: stable   Complications: No apparent anesthesia complications

## 2013-11-05 NOTE — Op Note (Signed)
NAMEMIRANDA, MARMER                ACCOUNT NO.:  [id number]  MEDICAL RECORD NO.:  [id number]  LOCATION:  [facility]                         FACILITY:  [facility]  PHYSICIAN:  Georges Lynch. Genita Nilsson, M.D.DATE OF BIRTH:  1955/08/13  DATE OF PROCEDURE:  11/05/2013 DATE OF DISCHARGE:                              OPERATIVE REPORT   SURGEON:  Georges Lynch. [name], M.D.  ASSISTANT:  Madlyn Frankel. Charlann Boxer, M.D. and Lanney Gins, [place]  PREOPERATIVE DIAGNOSIS:  Degenerative arthritis of the left hip with bone on bone.  POSTOPERATIVE DIAGNOSIS:  Degenerative arthritis of the left hip with bone on bone.  OPERATION:  Left total hip arthroplasty utilizing DePuy system.  I utilized a size 4 Tri-Lock stem.  A size 36 mm 1.5 ball, a cup with a size 52, with an insert.  Insert diameter was polyethylene 36 mm inside diameter.  The head was not a metal ball, it was a ceramic ball head.  PROCEDURE IN DETAIL:  Under general anesthesia, routine orthopedic prep and draping was carried out based on the right side and left side up. She had 2 g of IV Ancef.  The appropriate time-out was carried out.  I also marked the appropriate leg in the holding area.  At this time, a posterolateral approach to the left hip was carried out.  Bleeders were identified and cauterized.  Self-retaining retractors were inserted.  I then incised the iliotibial band in the usual fashion.  I then partially detached the external rotators.  I advanced the retractors with great care taking care not to injure the sciatic nerve.  I then did a capsulectomy, dislocated the femoral head, amputated the femoral head at the appropriate neck length.  We then utilized the box osteotome to remove the cancellous bone from trochanter.  We then used a widening reamer, then the canal finder.  Following that, we then rasped the head of the shaft up to a size 4.  We thoroughly irrigated it, packed it with a large packing, and then later removed it.  We then directed  attention to the acetabulum.  We completed a capsulectomy and removed all the ligaments from the inside of the acetabulum.  At that time, we then reamed the acetabulum up to a size 51 for a 52 mm cup.  A 52-mm Pinnacle cup was inserted with 3 screw holes.  We checked the alignment with the [place] alignment guide.  We then drilled 2 holes into the acetabulum and utilized 2 screws, 30 mm and 20 mm screws.  At this time, the "manhole cover" then was inserted.  We then inserted our polyethylene cup.  We had excellent position.  At that time, we then made sure the packing was removed from the canal.  We irrigated the canal and inserted our Tri-Lock stem size 4 standard offset.  We then inserted our ball again which was a ceramic ball at this time after we did another trial. The ceramic ball was 36-mm diameter ball with an +1.5 neck length.  It was a standard neck length.  We then reduced the hip.  Made sure there were no loose fragments in the hip.  We had excellent function.  We checked  leg lengths once again.  We did that through the entire procedure.  We had good stability.  We then irrigated the wound again and then packed thrombin-soaked Gelfoam down the wound, deep, and then closed the wound in layers in usual fashion.  Sterile dressings were applied.  We utilized 20 mL of Exparel with 20 mL of saline at the end of the procedure.          ______________________________ Georges Lynchonald A. Darrelyn HillockGioffre, M.D.     RAG/MEDQ  D:  11/05/2013  T:  11/05/2013  Job:  [id number]

## 2013-11-05 NOTE — Progress Notes (Signed)
PT Cancellation Note  Patient Details Name: [name] MRN: [id number] DOB: 1955/12/04   Cancelled Treatment:     PT deferred this date - order received but to start tomorrow 10/06/13.  Will follow in am   Alekzander Cardell 11/05/2013, 4:44 PM

## 2013-11-05 NOTE — Transfer of Care (Signed)
Immediate Anesthesia Transfer of Care Note  Patient: [name]  Procedure(s) Performed: Procedure(s): LEFT TOTAL HIP ARTHROPLASTY (Left)  Patient Location: PACU  Anesthesia Type:General  Level of Consciousness: awake, alert  and oriented  Airway & Oxygen Therapy: Patient Spontanous Breathing and Patient connected to face mask oxygen  Post-op Assessment: Report given to PACU RN and Post -op Vital signs reviewed and stable  Post vital signs: Reviewed and stable  Complications: No apparent anesthesia complications

## 2013-11-05 NOTE — Brief Op Note (Signed)
11/05/2013  8:49 AM  PATIENT:  [name]  58 y.o. female  PRE-OPERATIVE DIAGNOSIS:  OSTEOARTHRITIS LEFT HIP  POST-OPERATIVE DIAGNOSIS:  Osteoarthritis Left Hip  PROCEDURE:  Procedure(s): LEFT TOTAL HIP ARTHROPLASTY (Left)  SURGEON:  Surgeon(s) and Role:    * Jacki Cones, MD - Primary    * Shelda Pal, MD - Assisting  PHYSICIAN ASSISTANT:Matt Babish   ASSISTANTS: Durene Romans and matt Babish  ANESTHESIA:   general  EBL:  Total I/O In: 1000 [I.V.:1000] Out: 800 [Urine:400; Blood:400]  BLOOD ADMINISTERED:none  DRAINS: none   LOCAL MEDICATIONS USED:  BUPIVICAINE 20cc mixed with 20cc of Normal Saline  SPECIMEN:  No Specimen  DISPOSITION OF SPECIMEN:  N/A  COUNTS:  YES  TOURNIQUET:  * No tourniquets in log *  DICTATION: .Other Dictation: Dictation Number [id number]  PLAN OF CARE: Admit to inpatient   PATIENT DISPOSITION:  Stable in OR   Delay start of Pharmacological VTE agent (>24hrs) due to surgical blood loss or risk of bleeding: yes

## 2013-11-05 NOTE — Interval H&P Note (Signed)
History and Physical Interval Note:  11/05/2013 7:03 AM  [name]  has presented today for surgery, with the diagnosis of OSTEOARTHRITIS LEFT HIP  The various methods of treatment have been discussed with the patient and family. After consideration of risks, benefits and other options for treatment, the patient has consented to  Procedure(s): LEFT TOTAL HIP ARTHROPLASTY (Left) as a surgical intervention .  The patient's history has been reviewed, patient examined, no change in status, stable for surgery.  I have reviewed the patient's chart and labs.  Questions were answered to the patient's satisfaction.     Vianca Bracher A

## 2013-11-05 NOTE — Progress Notes (Addendum)
Clinical Social Work Department BRIEF PSYCHOSOCIAL ASSESSMENT 11/05/2013  Patient:  [name], [name]     Account Number:  [id number]     Admit date:  11/05/2013  Clinical Social Worker:  Lacie Scotts  Date/Time:  11/05/2013 02:54 PM  Referred by:  Physician  Date Referred:  11/05/2013 Referred for  SNF Placement   Other Referral:   Interview type:  Patient Other interview type:    PSYCHOSOCIAL DATA Living Status:  ALONE Admitted from facility:   Level of care:   Primary support name:  Latoya Leaks Primary support relationship to patient:  CHILD, ADULT Degree of support available:   Supportive    CURRENT CONCERNS Current Concerns  Post-Acute Placement   Other Concerns:    SOCIAL WORK ASSESSMENT / PLAN Pt is a 58 yr old female living at home prior to hospitalization. CSW met with pt  / family to assist with d/c planning. This is a planned admission. Pt will need ST Rehab following hospital d/c. Pt / family are in agreement with this plan. SNF search has been initiated and bed offers provided to pt / daughter. Family plans to tour facilities today and contact CSW with their choice. Pt has BCBS out of state insurance that requires prior authorization. CSW will assist with this process.   Assessment/plan status:  Psychosocial Support/Ongoing Assessment of Needs Other assessment/ plan:   Information/referral to community resources:   SNF list with bed offers provided. Insurance coverage for SNF and ambulance transport reviewed.    PATIENT'S/FAMILY'S RESPONSE TO PLAN OF CARE: Pt / family were eager to meet with CSW and begin d/c planning. Pt realizes a short time in rehab will be helpful prior returning home since she lives alone.   Werner Lean LCSW [phone]

## 2013-11-05 NOTE — Progress Notes (Signed)
Clinical Social Work Department CLINICAL SOCIAL WORK PLACEMENT NOTE 11/05/2013  Patient:  [name], [name]  Account Number:  [id number] Admit date:  11/05/2013  Clinical Social Worker:  Cori Razor, LCSW  Date/time:  11/05/2013 03:06 PM  Clinical Social Work is seeking post-discharge placement for this patient at the following level of care:   SKILLED NURSING   (*CSW will update this form in Epic as items are completed)   11/05/2013  Patient/family provided with [facility] Department of Clinical Social Work's list of facilities offering this level of care within the geographic area requested by the patient (or if unable, by the patient's family).  11/05/2013  Patient/family informed of their freedom to choose among providers that offer the needed level of care, that participate in Medicare, Medicaid or managed care program needed by the patient, have an available bed and are willing to accept the patient.    Patient/family informed of [facility]' ownership interest in [facility], as well as of the fact that they are under no obligation to receive care at this facility.  PASARR submitted to EDS on 11/05/2013 PASARR number received on 11/05/2013  FL2 transmitted to all facilities in geographic area requested by pt/family on  11/05/2013 FL2 transmitted to all facilities within larger geographic area on   Patient informed that his/her managed care company has contracts with or will negotiate with  certain facilities, including the following:     Patient/family informed of bed offers received:  11/05/2013 Patient chooses bed at  Physician recommends and patient chooses bed at    Patient to be transferred to  on   Patient to be transferred to facility by  Patient and family notified of transfer on  Name of family member notified:    The following physician request were entered in Epic:   Additional Comments:  Cori Razor LCSW [phone]

## 2013-11-06 DIAGNOSIS — E876 Hypokalemia: Secondary | ICD-10-CM | POA: Clinically undetermined

## 2013-11-06 DIAGNOSIS — D62 Acute posthemorrhagic anemia: Secondary | ICD-10-CM | POA: Diagnosis not present

## 2013-11-06 LAB — BASIC METABOLIC PANEL
BUN: 13 mg/dL (ref 6–23)
CHLORIDE: 102 meq/L (ref 96–112)
CO2: 32 mEq/L (ref 19–32)
Calcium: 8.3 mg/dL — ABNORMAL LOW (ref 8.4–10.5)
Creatinine, Ser: 0.67 mg/dL (ref 0.50–1.10)
GFR calc non Af Amer: >90 mL/min (ref >90)
Glucose, Bld: 127 mg/dL — ABNORMAL HIGH (ref 70–99)
POTASSIUM: 3.2 meq/L — AB (ref 3.7–5.3)
Sodium: 141 mEq/L (ref 137–147)

## 2013-11-06 LAB — CBC
HEMATOCRIT: 23.5 % — AB (ref 36.0–46.0)
Hemoglobin: 7.6 g/dL — ABNORMAL LOW (ref 12.0–15.0)
MCH: 29.3 pg (ref 26.0–34.0)
MCHC: 32.3 g/dL (ref 30.0–36.0)
MCV: 90.7 fL (ref 78.0–100.0)
PLATELETS: 217 10*3/uL (ref 150–400)
RBC: 2.59 MIL/uL — ABNORMAL LOW (ref 3.87–5.11)
RDW: 14.1 % (ref 11.5–15.5)
WBC: 10 10*3/uL (ref 4.0–10.5)

## 2013-11-06 LAB — PREPARE RBC (CROSSMATCH)

## 2013-11-06 LAB — HEMOGLOBIN AND HEMATOCRIT, BLOOD
HCT: 22.3 % — ABNORMAL LOW (ref 36.0–46.0)
Hemoglobin: 7.4 g/dL — ABNORMAL LOW (ref 12.0–15.0)

## 2013-11-06 MED ORDER — SODIUM CHLORIDE 0.9 % IV BOLUS (SEPSIS)
500.0000 mL | Freq: Once | INTRAVENOUS | Status: AC
Start: 2013-11-06 — End: 2013-11-06
  Administered 2013-11-06: 500 mL via INTRAVENOUS

## 2013-11-06 MED ORDER — FERROUS SULFATE 325 (65 FE) MG PO TABS: 325.0000 mg | ORAL_TABLET | Freq: Three times a day (TID) | ORAL | Status: DC

## 2013-11-06 MED ORDER — RIVAROXABAN 10 MG PO TABS: 10.0000 mg | ORAL_TABLET | Freq: Every day | ORAL | Status: DC

## 2013-11-06 MED ORDER — DOCUSATE SODIUM 100 MG PO CAPS
100.0000 mg | ORAL_CAPSULE | Freq: Two times a day (BID) | ORAL | Status: DC
  Administered 2013-11-06 – 2013-11-09 (×6): 100 mg via ORAL

## 2013-11-06 MED ORDER — POTASSIUM CHLORIDE CRYS ER 20 MEQ PO TBCR
40.0000 meq | EXTENDED_RELEASE_TABLET | Freq: Once | ORAL | Status: AC
  Administered 2013-11-06: 40 meq via ORAL
  Filled 2013-11-06: qty 2

## 2013-11-06 MED ORDER — METHOCARBAMOL 500 MG PO TABS: 500.0000 mg | ORAL_TABLET | Freq: Four times a day (QID) | ORAL | Status: DC | PRN

## 2013-11-06 MED ORDER — OXYCODONE-ACETAMINOPHEN 5-325 MG PO TABS: 1.0000 | ORAL_TABLET | ORAL | Status: DC | PRN

## 2013-11-06 MED ORDER — DSS 100 MG PO CAPS: 100.0000 mg | ORAL_CAPSULE | Freq: Two times a day (BID) | ORAL | Status: DC

## 2013-11-06 NOTE — Evaluation (Signed)
Occupational Therapy Evaluation Patient Details Name: Regina KaufmannRenee E Arnold MRN: [id number] DOB: 08-24-1955 Today's Date: 11/06/2013    History of Present Illness Pt was admitted for posterior THA on L   Clinical Impression   This 58 year old female was admitted for the above surgery.  She will benefit from skilled OT to increase safety and independence with ADLs following posterior THPs and PWB.  Pt was independent to mod I with adls prior to admission. She needs up to max A for LB adls at this time.  Transfer was not performed, but PT had A x 2 for safety during their evaluation.  Goals in acute care are for overall min A.    Follow Up Recommendations  SNF    Equipment Recommendations   (pt has a 3:1 commode)    Recommendations for Other Services       Precautions / Restrictions Precautions Precautions: Posterior Hip Precaution Booklet Issued: Yes (comment) Precaution Comments: Precautions reviewed, sign hung in room Restrictions Weight Bearing Restrictions: Yes LLE Weight Bearing: Partial weight bearing LLE Partial Weight Bearing Percentage or Pounds: 50%      Mobility Bed Mobility              Transfers Overall transfer level: Needs assistance Equipment used: Rolling walker (2 wheeled) Transfers: Sit to/from Stand Sit to Stand: Mod assist (from recliner)         General transfer comment: cues for UE/LE placement    Balance                                            ADL Overall ADL's : Needs assistance/impaired             Lower Body Bathing: Sit to/from stand;Adhering to hip precautions;With adaptive equipment;Moderate assistance (70%)       Lower Body Dressing: Maximal assistance;With adaptive equipment;Sit to/from stand;Adhering to hip precautions                 General ADL Comments: Pt can perform UB adls and grooming with set up from chair.  Educated on THPs for adls and introduced AE.  Pt practiced sock with sock aide  on R foot.  had a lot of pain during OT eval, but willing to participate.  will need reinforcement.  Performed sit to stand with min A and maintained static standing for 2 minutes for pressure relief     Vision                     Perception     Praxis      Pertinent Vitals/Pain 9/10 L hip.  Repositioned and ice reapplied     Hand Dominance Right   Extremity/Trunk Assessment Upper Extremity Assessment Upper Extremity Assessment: Overall WFL for tasks assessed      Cervical / Trunk Assessment Cervical / Trunk Assessment: Normal   Communication Communication Communication: No difficulties   Cognition Arousal/Alertness: Awake/alert Behavior During Therapy: WFL for tasks assessed/performed Overall Cognitive Status: Within Functional Limits for tasks assessed                     General Comments       Exercises      Shoulder Instructions      Home Living Family/patient expects to be discharged to:: Skilled nursing facility Living Arrangements: Alone  Additional Comments: Pt concerned about going to a good facility  She has a 3:1 commode and tub.  Agreeable to sponge bathing initially, if needed.        Prior Functioning/Environment Level of Independence: Independent;Independent with assistive device(s)        Comments: occassional use of cane    OT Diagnosis: Generalized weakness   OT Problem List: Decreased strength;Decreased activity tolerance;Decreased knowledge of use of DME or AE;Decreased knowledge of precautions;Pain   OT Treatment/Interventions: Self-care/ADL training;DME and/or AE instruction;Patient/family education    OT Goals(Current goals can be found in the care plan section) Acute Rehab OT Goals Patient Stated Goal: go to a good place for rehab OT Goal Formulation: With patient Time For Goal Achievement: 11/13/13 Potential to Achieve Goals: Good ADL Goals Pt Will Perform Grooming:  with supervision;standing Pt Will Perform Lower Body Bathing: with min assist;with adaptive equipment;sit to/from stand Pt Will Perform Lower Body Dressing: with min assist;with adaptive equipment;sit to/from stand Pt Will Transfer to Toilet: with min assist;ambulating;bedside commode Pt Will Perform Toileting - Clothing Manipulation and hygiene: with min assist;sit to/from stand Additional ADL Goal #1: Pt will not need more than 1 vc for THPs during OT session  OT Frequency: Min 2X/week   Barriers to D/C:            Co-evaluation              End of Session    Activity Tolerance: Patient limited by pain Patient left: in chair;with call bell/phone within reach;with family/visitor present   Time: [id number] OT Time Calculation (min): 27 min Charges:  OT General Charges $OT Visit: 1 Procedure OT Evaluation $Initial OT Evaluation Tier I: 1 Procedure OT Treatments $Self Care/Home Management : 8-22 mins G-Codes:    Huma Imhoff 11/06/2013, 3:08 PM Marica OtterMaryellen Jarquis Walker, OTR/L [phone] 11/06/2013

## 2013-11-06 NOTE — Progress Notes (Signed)
Patient c/o chest tightness and anxiety. EKG performed. Results called in to Marriottmber Constable PA. No new orders given. Patient did not c/o chest tighness after EKG was performed.

## 2013-11-06 NOTE — Evaluation (Signed)
Physical Therapy Evaluation Patient Details Name: [name] MRN: [id number] DOB: [birth year] Today's Date: 11/06/2013   History of Present Illness     Clinical Impression  Pt s/p L THR presents with decreased L LE strength/ROM, post op pain, PWB status and posterior THP limiting functional mobility.  Pt would benefit from follow up rehab at SNF level to maximize IND and safety prior to return home with limited assist.    Follow Up Recommendations SNF    Equipment Recommendations  None recommended by PT    Recommendations for Other Services OT consult     Precautions / Restrictions Precautions Precautions: Posterior Hip Precaution Booklet Issued: Yes (comment) Precaution Comments: Precautions reviewed, sign hung in room Restrictions Weight Bearing Restrictions: Yes LLE Weight Bearing: Partial weight bearing LLE Partial Weight Bearing Percentage or Pounds: 50%      Mobility  Bed Mobility Overal bed mobility: Needs Assistance Bed Mobility: Supine to Sit     Supine to sit: Mod assist     General bed mobility comments: cues for sequence, use of R LE to self assist and adherence to posterior THP  Transfers Overall transfer level: Needs assistance Equipment used: Rolling walker (2 wheeled) Transfers: Sit to/from Stand Sit to Stand: +2 physical assistance;Mod assist         General transfer comment: cues for LE management, use of UEs to self assist and adherence to posterior THP  Ambulation/Gait Ambulation/Gait assistance: Min assist;Mod assist Ambulation Distance (Feet): 14 Feet Assistive device: Rolling walker (2 wheeled) Gait Pattern/deviations: Step-to pattern;Decreased step length - right;Decreased step length - left;Shuffle;Trunk flexed Gait velocity: decr   General Gait Details: cues for sequence, posture, stride length, position from RW adn ER on L  Stairs            Wheelchair Mobility    Modified Rankin (Stroke Patients Only)        Balance                                             Pertinent Vitals/Pain 4/10; premed, ice packs provided    Home Living Family/patient expects to be discharged to:: Skilled nursing facility Living Arrangements: Alone                    Prior Function Level of Independence: Independent;Independent with assistive device(s)         Comments: occassional use of cane     Hand Dominance   Dominant Hand: Right    Extremity/Trunk Assessment   Upper Extremity Assessment: Overall WFL for tasks assessed           Lower Extremity Assessment: LLE deficits/detail   LLE Deficits / Details: Hip strength 2/5 with AAROM at hip to 70 flex and 15 abd  Cervical / Trunk Assessment: Normal  Communication   Communication: No difficulties  Cognition Arousal/Alertness: Awake/alert Behavior During Therapy: WFL for tasks assessed/performed Overall Cognitive Status: Within Functional Limits for tasks assessed                      General Comments      Exercises Total Joint Exercises Ankle Circles/Pumps: AROM;Both;15 reps;Supine Quad Sets: AROM;Both;10 reps;Supine Heel Slides: AAROM;15 reps;Supine;Left Hip ABduction/ADduction: AAROM;Left;10 reps;Supine      Assessment/Plan    PT Assessment Patient needs continued PT services  PT Diagnosis Difficulty walking   PT  Problem List Decreased strength;Decreased range of motion;Decreased activity tolerance;Decreased mobility;Decreased knowledge of use of DME;Pain;Decreased knowledge of precautions  PT Treatment Interventions DME instruction;Gait training;Stair training;Functional mobility training;Therapeutic activities;Therapeutic exercise;Patient/family education   PT Goals (Current goals can be found in the Care Plan section) Acute Rehab PT Goals Patient Stated Goal: Rehab and then home alone PT Goal Formulation: With patient Time For Goal Achievement: 11/13/13 Potential to Achieve Goals:  Good    Frequency 7X/week   Barriers to discharge        Co-evaluation               End of Session Equipment Utilized During Treatment: Gait belt Activity Tolerance: Patient tolerated treatment well Patient left: in chair;with call bell/phone within reach Nurse Communication: Mobility status         Time: [id number] PT Time Calculation (min): 34 min   Charges:   PT Evaluation $Initial PT Evaluation Tier I: 1 Procedure PT Treatments $Gait Training: 8-22 mins $Therapeutic Exercise: 8-22 mins   PT G Codes:          Shalece Staffa 11/06/2013, 1:25 PM

## 2013-11-06 NOTE — Discharge Summary (Signed)
Physician Discharge Summary   Patient ID: [name] MRN: [id number] DOB/AGE: 11/24/1955 58 y.o.  Admit date: 11/05/2013 Discharge date:  11/09/2013  Primary Diagnosis: Osteoarthritis, left hip   Admission Diagnoses:  Past Medical History  Diagnosis Date  . Hypertension   . Arthritis   . Chronic back pain   . Chest pain 2006  . Asthma   . Shortness of breath     due to  smoking    Discharge Diagnoses:   Active Problems:   Osteoarthritis of left hip   History of total left hip replacement   Hypokalemia   Acute blood loss anemia  Estimated body mass index is 27.98 kg/(m^2) as calculated from the following:   Height as of this encounter: _0  (1.549 m).   Weight as of this encounter: 67.132 kg (148 lb).  Procedure(s) (LRB): LEFT TOTAL HIP ARTHROPLASTY (Left)   Consults: None  HPI: [name], 58 y.o. female, has a history of pain and functional disability in the left hip(s) due to arthritis and patient has failed non-surgical conservative treatments for greater than 12 weeks to include NSAID's and/or analgesics, flexibility and strengthening excercises, use of assistive devices and activity modification. Onset of symptoms was gradual starting 3 years ago with gradually worsening course since that time.The patient noted no past surgery on the left hip(s). Patient currently rates pain in the left hip at 7 out of 10 with activity. Patient has night pain, worsening of pain with activity and weight bearing, pain that interfers with activities of daily living, pain with passive range of motion and crepitus. Patient has evidence of subchondral cysts, periarticular osteophytes and joint space narrowing by imaging studies. This condition presents safety issues increasing the risk of falls. There is no current active infection.   Laboratory Data: Admission on 11/05/2013  Component Date Value Ref Range Status  . WBC 11/06/2013 10.0  4.0 - 10.5 K/uL Final  . RBC 11/06/2013 2.59*  3.87 - 5.11 MIL/uL Final  . Hemoglobin 11/06/2013 7.6* 12.0 - 15.0 g/dL Final  . HCT 11/06/2013 23.5* 36.0 - 46.0 % Final  . MCV 11/06/2013 90.7  78.0 - 100.0 fL Final  . MCH 11/06/2013 29.3  26.0 - 34.0 pg Final  . MCHC 11/06/2013 32.3  30.0 - 36.0 g/dL Final  . RDW 11/06/2013 14.1  11.5 - 15.5 % Final  . Platelets 11/06/2013 217  150 - 400 K/uL Final  . Sodium 11/06/2013 141  137 - 147 mEq/L Final  . Potassium 11/06/2013 3.2* 3.7 - 5.3 mEq/L Final  . Chloride 11/06/2013 102  96 - 112 mEq/L Final  . CO2 11/06/2013 32  19 - 32 mEq/L Final  . Glucose, Bld 11/06/2013 127* 70 - 99 mg/dL Final  . BUN 11/06/2013 13  6 - 23 mg/dL Final  . Creatinine, Ser 11/06/2013 0.67  0.50 - 1.10 mg/dL Final  . Calcium 11/06/2013 8.3* 8.4 - 10.5 mg/dL Final  . GFR calc non Af Amer 11/06/2013 >90  >90 mL/min Final  . GFR calc Af Amer 11/06/2013 >90  >90 mL/min Final   Comment: (NOTE)                          The eGFR has been calculated using the CKD EPI equation.                          This calculation has not been validated in  all clinical situations.                          eGFR's persistently <90 mL/min signify possible Chronic Kidney                          Disease.  Hospital Outpatient Visit on 10/29/2013  Component Date Value Ref Range Status  . aPTT 10/29/2013 31  24 - 37 seconds Final  . Sodium 10/29/2013 142  137 - 147 mEq/L Final  . Potassium 10/29/2013 4.2  3.7 - 5.3 mEq/L Final  . Chloride 10/29/2013 103  96 - 112 mEq/L Final  . CO2 10/29/2013 32  19 - 32 mEq/L Final  . Glucose, Bld 10/29/2013 69* 70 - 99 mg/dL Final  . BUN 10/29/2013 10  6 - 23 mg/dL Final  . Creatinine, Ser 10/29/2013 0.70  0.50 - 1.10 mg/dL Final  . Calcium 10/29/2013 9.3  8.4 - 10.5 mg/dL Final  . Total Protein 10/29/2013 6.7  6.0 - 8.3 g/dL Final  . Albumin 10/29/2013 3.7  3.5 - 5.2 g/dL Final  . AST 10/29/2013 15  0 - 37 U/L Final  . ALT 10/29/2013 8  0 - 35 U/L Final  . Alkaline Phosphatase 10/29/2013 65  39  - 117 U/L Final  . Total Bilirubin 10/29/2013 0.3  0.3 - 1.2 mg/dL Final  . GFR calc non Af Amer 10/29/2013 >90  >90 mL/min Final  . GFR calc Af Amer 10/29/2013 >90  >90 mL/min Final   Comment: (NOTE)                          The eGFR has been calculated using the CKD EPI equation.                          This calculation has not been validated in all clinical situations.                          eGFR's persistently <90 mL/min signify possible Chronic Kidney                          Disease.  Marland Kitchen Prothrombin Time 10/29/2013 12.4  11.6 - 15.2 seconds Final  . INR 10/29/2013 0.94  0.00 - 1.49 Final  . ABO/RH(D) 10/29/2013 O POS   Final  . Antibody Screen 10/29/2013 NEG   Final  . Sample Expiration 10/29/2013 11/08/2013   Final  . Color, Urine 10/29/2013 YELLOW  YELLOW Final  . APPearance 10/29/2013 CLEAR  CLEAR Final  . Specific Gravity, Urine 10/29/2013 1.006  1.005 - 1.030 Final  . pH 10/29/2013 5.0  5.0 - 8.0 Final  . Glucose, UA 10/29/2013 NEGATIVE  NEGATIVE mg/dL Final  . Hgb urine dipstick 10/29/2013 NEGATIVE  NEGATIVE Final  . Bilirubin Urine 10/29/2013 NEGATIVE  NEGATIVE Final  . Ketones, ur 10/29/2013 NEGATIVE  NEGATIVE mg/dL Final  . Protein, ur 10/29/2013 NEGATIVE  NEGATIVE mg/dL Final  . Urobilinogen, UA 10/29/2013 0.2  0.0 - 1.0 mg/dL Final  . Nitrite 10/29/2013 NEGATIVE  NEGATIVE Final  . Leukocytes, UA 10/29/2013 NEGATIVE  NEGATIVE Final   MICROSCOPIC NOT DONE ON URINES WITH NEGATIVE PROTEIN, BLOOD, LEUKOCYTES, NITRITE, OR GLUCOSE <1000 mg/dL.  Marland Kitchen MRSA, PCR 10/29/2013 NEGATIVE  NEGATIVE Final  . Staphylococcus aureus 10/29/2013  NEGATIVE  NEGATIVE Final   Comment:                                 The Xpert SA Assay (FDA                          approved for NASAL specimens                          in patients over 77 years of age),                          is one component of                          a comprehensive surveillance                          program.  Test  performance has                          been validated by [employer] for patients greater                          than or equal to 22 year old.                          It is not intended                          to diagnose infection nor to                          guide or monitor treatment.  . WBC 10/29/2013 5.2  4.0 - 10.5 K/uL Final  . RBC 10/29/2013 4.34  3.87 - 5.11 MIL/uL Final  . Hemoglobin 10/29/2013 12.7  12.0 - 15.0 g/dL Final  . HCT 10/29/2013 39.2  36.0 - 46.0 % Final  . MCV 10/29/2013 90.3  78.0 - 100.0 fL Final  . MCH 10/29/2013 29.3  26.0 - 34.0 pg Final  . MCHC 10/29/2013 32.4  30.0 - 36.0 g/dL Final  . RDW 10/29/2013 14.4  11.5 - 15.5 % Final  . Platelets 10/29/2013 340  150 - 400 K/uL Final  . ABO/RH(D) 10/29/2013 O POS   Final     X-Rays:Dg Chest 2 View  10/29/2013   CLINICAL DATA:  Preoperative evaluation for hip replacement  EXAM: CHEST  2 VIEW  COMPARISON:  01/31/2013  FINDINGS: The heart size and mediastinal contours are within normal limits. Both lungs are clear. The visualized skeletal structures are unremarkable.  IMPRESSION: No active cardiopulmonary disease.   Electronically Signed   By: Inez Catalina M.D.   On: 10/29/2013 14:40   Dg Hip Portable 1 View Left  11/05/2013   CLINICAL DATA:  Postop left hip replacement.  EXAM: PORTABLE LEFT HIP - 1 VIEW  COMPARISON:  None.  FINDINGS: Left hip prosthesis is well-seated and aligned. There is no acute fracture or evidence  of an operative complication.  IMPRESSION: Well aligned left hip prosthesis.   Electronically Signed   By: Lajean Manes M.D.   On: 11/05/2013 09:55    EKG: Orders placed during the hospital encounter of 10/29/13  . EKG 12-LEAD  . EKG 12-LEAD     Hospital Course: Patient was admitted to [facility] and taken to the OR and underwent the above state procedure without complications.  Patient tolerated the procedure well and was later transferred to the recovery  room and then to the orthopaedic floor for postoperative care.  They were given PO and IV analgesics for pain control following their surgery.  They were given 24 hours of postoperative antibiotics of  Anti-infectives   Start     Dose/Rate Route Frequency Ordered Stop   11/05/13 1400  ceFAZolin (ANCEF) IVPB 1 g/50 mL premix     1 g 100 mL/hr over 30 Minutes Intravenous Every 6 hours 11/05/13 1105 11/05/13 2119   11/05/13 0822  polymyxin B 500,000 Units, bacitracin 50,000 Units in sodium chloride irrigation 0.9 % 500 mL irrigation  Status:  Discontinued       As needed 11/05/13 0822 11/05/13 0931   11/05/13 0539  ceFAZolin (ANCEF) IVPB 2 g/50 mL premix     2 g 100 mL/hr over 30 Minutes Intravenous On call to O.R. 11/05/13 6010 11/05/13 0743     and started on DVT prophylaxis in the form of Xarelto.   PT and OT were ordered for total hip protocol.  The patient was allowed to be WBAT with therapy. Discharge planning was consulted to help with postop disposition and equipment needs.  Patient had a decent night on the evening of surgery.  They started to get up OOB with therapy on day one.  The knee immobilizer was removed and discontinued.  Continued to work with therapy into day two.  At time of discharge summary, Hgb was being closely monitored. Patient was asymptomatic but developed ABLA. Patient had some anxiety about the possibility of blood transfusion and developed chest tightness. EKG normal except T wave abnormality. Patient did improve with rest. Blood transfusion of two units on Friday afternoon which patient tolerated well. Plan was for DC to SNF Monday.    Diet: Regular diet Activity:PWB 50% for one week then progress to WBAT No bending hip over 90 degrees- A "L" Angle Do not cross legs Do not let foot roll inward When turning these patients a pillow should be placed between the patient's legs to prevent crossing. Patients should have the affected knee fully extended when trying to sit  or stand from all surfaces to prevent excessive hip flexion. When ambulating and turning toward the affected side the affected leg should have the toes turned out prior to moving the walker and the rest of patient's body as to prevent internal rotation/ turning in of the leg. Abduction pillows are the most effective way to prevent a patient from not crossing legs or turning toes in at rest. If an abduction pillow is not ordered placing a regular pillow length wise between the patient's legs is also an effective reminder. It is imperative that these precautions be maintained so that the surgical hip does not dislocate. Follow-up:in 2 weeks Disposition - Skilled nursing facility Discharged Condition: stable   Discharge Instructions   Call MD / Call 911    Complete by:  As directed   If you experience chest pain or shortness of breath, CALL 911 and be transported to  the hospital emergency room.  If you develope a fever above 101 F, pus (white drainage) or increased drainage or redness at the wound, or calf pain, call your surgeon's office.     Constipation Prevention    Complete by:  As directed   Drink plenty of fluids.  Prune juice may be helpful.  You may use a stool softener, such as Colace (over the counter) 100 mg twice a day.  Use MiraLax (over the counter) for constipation as needed.     Diet general    Complete by:  As directed      Discharge instructions    Complete by:  As directed   Walk with your walker. Partial weightbearing 50% for one week then progress to WBAT Do not change your dressing unless there is excess drainage Shower only, no tub bath. Call if any temperatures greater than 101 or any wound complications: [phone] during the day and ask for Dr. Charlestine Night nurse, Brunilda Payor.     Driving restrictions    Complete by:  As directed   No driving     Follow the hip precautions as taught in Physical Therapy    Complete by:  As directed      Increase activity slowly as  tolerated    Complete by:  As directed             Medication List    STOP taking these medications       HAIR/SKIN/NAILS PO     multivitamin with minerals Tabs tablet     NEOMYCIN-POLYMYXIN-HYDROCORTISONE 1 % Soln otic solution  Commonly known as:  CORTISPORIN     oxyCODONE-acetaminophen 10-325 MG per tablet  Commonly known as:  PERCOCET  Replaced by:  oxyCODONE-acetaminophen 5-325 MG per tablet      TAKE these medications       albuterol 108 (90 BASE) MCG/ACT inhaler  Commonly known as:  PROVENTIL HFA;VENTOLIN HFA  Inhale 2 puffs into the lungs every 4 (four) hours as needed for wheezing or shortness of breath.     DSS 100 MG Caps  Take 100 mg by mouth 2 (two) times daily.     enalapril-hydrochlorothiazide 10-25 MG per tablet  Commonly known as:  VASERETIC  Take 1 tablet by mouth daily before breakfast.     ferrous sulfate 325 (65 FE) MG tablet  Take 1 tablet (325 mg total) by mouth 3 (three) times daily after meals.     methocarbamol 500 MG tablet  Commonly known as:  ROBAXIN  Take 1 tablet (500 mg total) by mouth every 6 (six) hours as needed for muscle spasms.     omeprazole 20 MG capsule  Commonly known as:  PRILOSEC  Take 20 mg by mouth daily.     oxyCODONE-acetaminophen 5-325 MG per tablet  Commonly known as:  PERCOCET/ROXICET  Take 1-2 tablets by mouth every 4 (four) hours as needed for severe pain.     rivaroxaban 10 MG Tabs tablet  Commonly known as:  XARELTO  Take 1 tablet (10 mg total) by mouth daily with breakfast.     zolpidem 10 MG tablet  Commonly known as:  AMBIEN  Take 10 mg by mouth at bedtime as needed for sleep.           Follow-up Information   Follow up with GIOFFRE,RONALD A, MD. Schedule an appointment as soon as possible for a visit in 2 weeks.   Specialty:  Orthopedic Surgery   Contact information:  [address] [place] [address] [phone]       Signed: Ardeen Jourdain, PA-C Orthopaedic  Surgery 11/09/2013, 8:46 AM

## 2013-11-06 NOTE — Progress Notes (Signed)
Subjective: 1 Day Post-Op Procedure(s) (LRB): LEFT TOTAL HIP ARTHROPLASTY (Left) Patient reports pain as 4 on 0-10 scale. No major post-Op issues. Her Hbg is 7.6 and will followclosely.  Objective: Vital signs in last 24 hours: Temp:  [96.5 F (35.8 C)-98.8 F (37.1 C)] 98.5 F (36.9 C) (06/12 0518) Pulse Rate:  [54-88] 61 (06/12 0518) Resp:  [6-16] 16 (06/12 0518) BP: (91-133)/(53-81) 106/70 mmHg (06/12 0518) SpO2:  [95 %-100 %] 100 % (06/12 0518) Weight:  [67.132 kg (148 lb)] 67.132 kg (148 lb) (06/11 1049)  Intake/Output from previous day: 06/11 0701 - 06/12 0700 In: 4593.3 [P.O.:960; I.V.:3633.3] Out: 1715 [Urine:1315; Blood:400] Intake/Output this shift:     Recent Labs  11/06/13 0356  HGB 7.6*    Recent Labs  11/06/13 0356  WBC 10.0  RBC 2.59*  HCT 23.5*  PLT 217    Recent Labs  11/06/13 0356  NA 141  K 3.2*  CL 102  CO2 32  BUN 13  CREATININE 0.67  GLUCOSE 127*  CALCIUM 8.3*   No results found for this basename: LABPT, INR,  in the last 72 hours  Neurologically intact  Assessment/Plan: 1 Day Post-Op Procedure(s) (LRB): LEFT TOTAL HIP ARTHROPLASTY (Left) Up with therapy  Brenee Gajda A 11/06/2013, 7:25 AM

## 2013-11-06 NOTE — Progress Notes (Signed)
CSW assisting with d/c planning. Pt has chosen [facility]. SNF will contact [employer] to begin authorization process. Expecting authorization by Monday. CSW will continue to follow to assist with d/c planning.  Cori RazorJamie Trenden Hazelrigg LCSW [phone]

## 2013-11-06 NOTE — Discharge Instructions (Addendum)
Walk with your walker. Partial weightbearing 50% for one week then progress to WBAT Do not change your dressing unless there is excess drainage Shower only, no tub bath. Call if any temperatures greater than 101 or any wound complications: [phone] during the day and ask for Dr. Jeannetta EllisGioffre's nurse, Mackey Birchwoodammy Johnson.  ========================================================== Information on my medicine - XARELTO (Rivaroxaban)  This medication education was reviewed with me or my healthcare representative as part of my discharge preparation.  The pharmacist that spoke with me during my hospital stay was:  Lejuan Botto, Ky Barbanandall K, RPH  Why was Xarelto prescribed for you? Xarelto was prescribed for you to reduce the risk of blood clots forming after orthopedic surgery. The medical term for these abnormal blood clots is venous thromboembolism (VTE).  What do you need to know about xarelto ? Take your Xarelto ONCE DAILY at the same time every day. You may take it either with or without food.  If you have difficulty swallowing the tablet whole, you may crush it and mix in applesauce just prior to taking your dose.  Take Xarelto exactly as prescribed by your doctor and DO NOT stop taking Xarelto without talking to the doctor who prescribed the medication.  Stopping without other VTE prevention medication to take the place of Xarelto may increase your risk of developing a clot.  After discharge, you should have regular check-up appointments with your healthcare provider that is prescribing your Xarelto.    What do you do if you miss a dose? If you miss a dose, take it as soon as you remember on the same day then continue your regularly scheduled once daily regimen the next day. Do not take two doses of Xarelto on the same day.   Important Safety Information A possible side effect of Xarelto is bleeding. You should call your healthcare provider right away if you experience any of the following:   Bleeding from an injury or your nose that does not stop.   Unusual colored urine (red or dark brown) or unusual colored stools (red or black).   Unusual bruising for unknown reasons.   A serious fall or if you hit your head (even if there is no bleeding).  Some medicines may interact with Xarelto and might increase your risk of bleeding while on Xarelto. To help avoid this, consult your healthcare provider or pharmacist prior to using any new prescription or non-prescription medications, including herbals, vitamins, non-steroidal anti-inflammatory drugs (NSAIDs) and supplements.  This website has more information on Xarelto: [email or web address].[email or web address].

## 2013-11-06 NOTE — Progress Notes (Signed)
CSW arranged for [facility] liaison to meet with pt and answer questions about her facility. Pt was pleased to have an opportunity to meet with liaision. " I feel more comfortable about placement now."  Cori RazorJamie Williom Cedar LCSW [phone]

## 2013-11-07 LAB — BASIC METABOLIC PANEL
BUN: 12 mg/dL (ref 6–23)
CALCIUM: 8.5 mg/dL (ref 8.4–10.5)
CO2: 31 mEq/L (ref 19–32)
CREATININE: 0.64 mg/dL (ref 0.50–1.10)
Chloride: 104 mEq/L (ref 96–112)
GFR calc non Af Amer: >90 mL/min (ref >90)
Glucose, Bld: 100 mg/dL — ABNORMAL HIGH (ref 70–99)
Potassium: 3.7 mEq/L (ref 3.7–5.3)
Sodium: 141 mEq/L (ref 137–147)

## 2013-11-07 LAB — CBC
HEMATOCRIT: 27.3 % — AB (ref 36.0–46.0)
Hemoglobin: 9 g/dL — ABNORMAL LOW (ref 12.0–15.0)
MCH: 29.4 pg (ref 26.0–34.0)
MCHC: 33 g/dL (ref 30.0–36.0)
MCV: 89.2 fL (ref 78.0–100.0)
PLATELETS: 182 10*3/uL (ref 150–400)
RBC: 3.06 MIL/uL — ABNORMAL LOW (ref 3.87–5.11)
RDW: 14.9 % (ref 11.5–15.5)
WBC: 10.5 10*3/uL (ref 4.0–10.5)

## 2013-11-07 MED ORDER — FLEET ENEMA 7-19 GM/118ML RE ENEM: 1.0000 | ENEMA | Freq: Every day | RECTAL | Status: DC | PRN

## 2013-11-07 MED ORDER — BISACODYL 10 MG RE SUPP
10.0000 mg | Freq: Every day | RECTAL | Status: DC | PRN
  Administered 2013-11-07: 10 mg via RECTAL
  Filled 2013-11-07: qty 1

## 2013-11-07 NOTE — Progress Notes (Signed)
   Subjective: 2 Days Post-Op Procedure(s) (LRB): LEFT TOTAL HIP ARTHROPLASTY (Left) Patient reports pain as moderate.   Patient seen in rounds with Dr. Lequita HaltAluisio. Patient is well, and has had no acute complaints or problems. No issues overnight. Feels better since getting the blood yesterday. Having some soreness in her left thigh.  Plan is to go Skilled nursing facility after hospital stay.  Objective: Vital signs in last 24 hours: Temp:  [98.1 F (36.7 C)-98.8 F (37.1 C)] 98.6 F (37 C) (06/13 0505) Pulse Rate:  [62-79] 65 (06/13 0505) Resp:  [16-18] 16 (06/13 0505) BP: (90-114)/(50-69) 96/57 mmHg (06/13 0505) SpO2:  [93 %-98 %] 93 % (06/13 0505)  Intake/Output from previous day:  Intake/Output Summary (Last 24 hours) at 11/07/13 0730 Last data filed at 11/07/13 0500  Gross per 24 hour  Intake   2190 ml  Output   2300 ml  Net   -110 ml    Intake/Output this shift:    Labs:  Recent Labs  11/06/13 0356 11/06/13 1445 11/07/13 0518  HGB 7.6* 7.4* 9.0*    Recent Labs  11/06/13 0356 11/06/13 1445 11/07/13 0518  WBC 10.0  --  10.5  RBC 2.59*  --  3.06*  HCT 23.5* 22.3* 27.3*  PLT 217  --  182    Recent Labs  11/06/13 0356 11/07/13 0518  NA 141 141  K 3.2* 3.7  CL 102 104  CO2 32 31  BUN 13 12  CREATININE 0.67 0.64  GLUCOSE 127* 100*  CALCIUM 8.3* 8.5   No results found for this basename: LABPT, INR,  in the last 72 hours  EXAM General - Patient is Alert and Oriented Extremity - Neurologically intact Intact pulses distally Dorsiflexion/Plantar flexion intact Compartment soft Dressing/Incision - clean, dry, no drainage Motor Function - intact, moving foot and toes well on exam.   Past Medical History  Diagnosis Date  . Hypertension   . Arthritis   . Chronic back pain   . Chest pain 2006  . Asthma   . Shortness of breath     due to  smoking     Assessment/Plan: 2 Days Post-Op Procedure(s) (LRB): LEFT TOTAL HIP ARTHROPLASTY  (Left) Active Problems:   Osteoarthritis of left hip   History of total left hip replacement   Hypokalemia   Acute blood loss anemia  Estimated body mass index is 27.98 kg/(m^2) as calculated from the following:   Height as of this encounter: 5\' 1"  (1.549 m).   Weight as of this encounter: 67.132 kg (148 lb). Advance diet Up with therapy D/C IV fluids Discharge to SNF Sunday or Monday (patient prefers Monday)  DVT Prophylaxis - Xarelto PWB 50% left LE  Continue PT. DC foley. Continue to monitor labs.   Dimitri PedAmber Kristofer Schaffert, PA-C Orthopaedic Surgery 11/07/2013, 7:30 AM

## 2013-11-07 NOTE — Plan of Care (Signed)
Problem: Phase III Progression Outcomes Goal: Anticoagulant follow-up in place Outcome: Not Applicable Date Met:  65/53/74 xarelto

## 2013-11-07 NOTE — Progress Notes (Signed)
Physical Therapy Treatment Patient Details Name: Regina KaufmannRenee E Arnold MRN: [id number] DOB: Jul 12, 1955 Today's Date: 11/07/2013    History of Present Illness  L THR - Posterior approach    PT Comments    VEry motivated and progressing well  Follow Up Recommendations  SNF     Equipment Recommendations  None recommended by PT    Recommendations for Other Services OT consult     Precautions / Restrictions Precautions Precautions: Posterior Hip Precaution Booklet Issued: Yes (comment) Precaution Comments: Pt recalls 2/3 THP without cues.  All precautions reviewed Restrictions Weight Bearing Restrictions: Yes LLE Weight Bearing: Partial weight bearing LLE Partial Weight Bearing Percentage or Pounds: 50%    Mobility  Bed Mobility                  Transfers Overall transfer level: Needs assistance Equipment used: Rolling walker (2 wheeled) Transfers: Sit to/from Stand Sit to Stand: Min assist         General transfer comment: cues for UE/LE placement  Ambulation/Gait Ambulation/Gait assistance: Min assist Ambulation Distance (Feet): 55 Feet Assistive device: Rolling walker (2 wheeled) Gait Pattern/deviations: Step-to pattern;Decreased step length - right;Decreased step length - left;Shuffle;Trunk flexed Gait velocity: decr   General Gait Details: cues for sequence, posture, stride length, increased BOS, position from RW and ER on L   Stairs            Wheelchair Mobility    Modified Rankin (Stroke Patients Only)       Balance                                    Cognition Arousal/Alertness: Awake/alert Behavior During Therapy: WFL for tasks assessed/performed Overall Cognitive Status: Within Functional Limits for tasks assessed                      Exercises Total Joint Exercises Ankle Circles/Pumps: AROM;Both;15 reps;Supine Quad Sets: AROM;Both;10 reps;Supine Gluteal Sets: AROM;Both;10 reps;Supine Heel Slides:  AAROM;Supine;Left;20 reps Hip ABduction/ADduction: AAROM;Left;Supine;15 reps    General Comments        Pertinent Vitals/Pain 6/10; premed, ice pack provided    Home Living                      Prior Function            PT Goals (current goals can now be found in the care plan section) Acute Rehab PT Goals Patient Stated Goal: go to a good place for rehab PT Goal Formulation: With patient Time For Goal Achievement: 11/13/13 Potential to Achieve Goals: Good Progress towards PT goals: Progressing toward goals    Frequency  7X/week    PT Plan Current plan remains appropriate    Co-evaluation             End of Session Equipment Utilized During Treatment: Gait belt Activity Tolerance: Patient tolerated treatment well Patient left: in chair;with call bell/phone within reach     Time: 1332-1407 PT Time Calculation (min): 35 min  Charges:  $Gait Training: 8-22 mins $Therapeutic Exercise: 8-22 mins                    G Codes:      Thedora Rings 11/07/2013, 4:15 PM

## 2013-11-07 NOTE — Progress Notes (Signed)
Physical Therapy Treatment Patient Details Name: Fransisca KaufmannRenee E Armor MRN: [id number] DOB: 1955-07-28 Today's Date: 11/07/2013    History of Present Illness      PT Comments      Follow Up Recommendations  SNF     Equipment Recommendations  None recommended by PT    Recommendations for Other Services OT consult     Precautions / Restrictions Precautions Precautions: Posterior Hip Precaution Booklet Issued: Yes (comment) Precaution Comments: Pt recalls 2/3 THP without cues.  All precautions reviewed Restrictions Weight Bearing Restrictions: Yes LLE Weight Bearing: Partial weight bearing LLE Partial Weight Bearing Percentage or Pounds: 50%    Mobility  Bed Mobility                  Transfers Overall transfer level: Needs assistance Equipment used: Rolling walker (2 wheeled) Transfers: Sit to/from Stand Sit to Stand: Min assist         General transfer comment: cues for UE/LE placement  Ambulation/Gait Ambulation/Gait assistance: Min assist Ambulation Distance (Feet): 58 Feet Assistive device: Rolling walker (2 wheeled) Gait Pattern/deviations: Step-to pattern;Decreased step length - right;Decreased step length - left;Shuffle;Trunk flexed Gait velocity: decr   General Gait Details: cues for sequence, posture, stride length, increased BOS, position from RW and ER on L   Stairs            Wheelchair Mobility    Modified Rankin (Stroke Patients Only)       Balance                                    Cognition Arousal/Alertness: Awake/alert Behavior During Therapy: WFL for tasks assessed/performed Overall Cognitive Status: Within Functional Limits for tasks assessed                      Exercises      General Comments        Pertinent Vitals/Pain 8/10; premed, declines additional meds, ice packs provided    Home Living                      Prior Function            PT Goals (current goals can now  be found in the care plan section) Acute Rehab PT Goals Patient Stated Goal: go to a good place for rehab PT Goal Formulation: With patient Time For Goal Achievement: 11/13/13 Potential to Achieve Goals: Good Progress towards PT goals: Progressing toward goals    Frequency  7X/week    PT Plan Current plan remains appropriate    Co-evaluation             End of Session Equipment Utilized During Treatment: Gait belt Activity Tolerance: Patient tolerated treatment well Patient left: in chair;with call bell/phone within reach     Time: 0950-1014 PT Time Calculation (min): 24 min  Charges:  $Gait Training: 23-37 mins                    G Codes:      Nohea Kras 11/07/2013, 3:54 PM

## 2013-11-08 LAB — TYPE AND SCREEN
ABO/RH(D): O POS
Antibody Screen: NEGATIVE
Unit division: 0
Unit division: 0

## 2013-11-08 LAB — CBC
HCT: 30.7 % — ABNORMAL LOW (ref 36.0–46.0)
Hemoglobin: 10 g/dL — ABNORMAL LOW (ref 12.0–15.0)
MCH: 29.2 pg (ref 26.0–34.0)
MCHC: 32.6 g/dL (ref 30.0–36.0)
MCV: 89.8 fL (ref 78.0–100.0)
PLATELETS: 242 10*3/uL (ref 150–400)
RBC: 3.42 MIL/uL — ABNORMAL LOW (ref 3.87–5.11)
RDW: 15.1 % (ref 11.5–15.5)
WBC: 11.5 10*3/uL — AB (ref 4.0–10.5)

## 2013-11-08 NOTE — Progress Notes (Signed)
Pt declaring she plans to go home now. Notified Dr Lequita HaltAluisio, no discharge order given but he made sure pt had Rx for anticoagulant (xarelto). Explained to pt re: SNF will not accept her if she goes AMA & that she is responsible for any complications that might occur. Pt's son currently talking with her. [name], [employer]

## 2013-11-08 NOTE — Progress Notes (Signed)
Pt has changed her mind for now. Will notify Dr Lequita HaltAluisio later as he is in surgery. Devina Bezold, [employer]

## 2013-11-08 NOTE — Progress Notes (Signed)
Physical Therapy Treatment Patient Details Name: Fransisca KaufmannRenee E Foot MRN: [id number] DOB: [birth year] Today's Date: 11/08/2013    History of Present Illness Pt was admitted for posterior THA on L    PT Comments    **Pt is doing well with mobility, she recalls 1/3 posterior THA precautions. Reviewed precautions in detail. *  Follow Up Recommendations  SNF     Equipment Recommendations  None recommended by PT    Recommendations for Other Services OT consult     Precautions / Restrictions Precautions Precautions: Posterior Hip Precaution Booklet Issued: Yes (comment) Precaution Comments: Pt recalls 1/3 THP without cues.  All precautions reviewed Restrictions Weight Bearing Restrictions: Yes LLE Weight Bearing: Partial weight bearing LLE Partial Weight Bearing Percentage or Pounds: 50%    Mobility  Bed Mobility                  Transfers Overall transfer level: Needs assistance Equipment used: Rolling walker (2 wheeled) Transfers: Sit to/from Stand Sit to Stand: Min guard         General transfer comment: cues for UE/LE placement  Ambulation/Gait   Ambulation Distance (Feet): 55 Feet Assistive device: Rolling walker (2 wheeled)   Gait velocity: decr   General Gait Details: cues for flexed neck posture, good sequencing and positioning in RW   Stairs            Wheelchair Mobility    Modified Rankin (Stroke Patients Only)       Balance                                    Cognition Arousal/Alertness: Awake/alert Behavior During Therapy: WFL for tasks assessed/performed Overall Cognitive Status: Within Functional Limits for tasks assessed                      Exercises Total Joint Exercises Ankle Circles/Pumps: AROM;Both;15 reps;Supine Quad Sets: AROM;Both;10 reps;Supine Gluteal Sets: AROM;Both;10 reps;Supine Short Arc Quad: AROM;Left;10 reps;Supine Heel Slides: AAROM;Supine;Left;20 reps Hip ABduction/ADduction:  AAROM;Left;Supine;15 reps    General Comments        Pertinent Vitals/Pain *7/10 L hip Premedicated, ice applied**    Home Living                      Prior Function            PT Goals (current goals can now be found in the care plan section) Acute Rehab PT Goals Patient Stated Goal: go to a good place for rehab; be able to play with grandchildren PT Goal Formulation: With patient Time For Goal Achievement: 11/13/13 Potential to Achieve Goals: Good Progress towards PT goals: Progressing toward goals    Frequency  7X/week    PT Plan Current plan remains appropriate    Co-evaluation             End of Session Equipment Utilized During Treatment: Gait belt Activity Tolerance: Patient tolerated treatment well Patient left: in chair;with call bell/phone within reach     Time: 0840-0905 PT Time Calculation (min): 25 min  Charges:  $Gait Training: 8-22 mins $Therapeutic Exercise: 8-22 mins                    G Codes:      Tamala SerUhlenberg, Jacky Hartung Kistler 11/08/2013, 9:20 AM [phone]

## 2013-11-08 NOTE — Progress Notes (Signed)
   Subjective: 3 Days Post-Op Procedure(s) (LRB): LEFT TOTAL HIP ARTHROPLASTY (Left) Patient reports pain as mild.   Patient seen in rounds with Dr. Lequita HaltAluisio. Patient is well, but has had some minor complaints of pain in the hip., requiring pain medications. She is sitting on the side of the bed getting her bath. Plan is to go Skilled nursing facility after hospital stay.  Objective: Vital signs in last 24 hours: Temp:  [98.4 F (36.9 C)-98.5 F (36.9 C)] 98.5 F (36.9 C) (06/14 0558) Pulse Rate:  [65-72] 72 (06/13 2046) Resp:  [16-19] 16 (06/14 0558) BP: (93-120)/(59-76) 120/76 mmHg (06/14 0558) SpO2:  [93 %-100 %] 98 % (06/14 0558)  Intake/Output from previous day:  Intake/Output Summary (Last 24 hours) at 11/08/13 0759 Last data filed at 11/08/13 0500  Gross per 24 hour  Intake   1503 ml  Output    150 ml  Net   1353 ml    Intake/Output this shift:    Labs:  Recent Labs  11/06/13 0356 11/06/13 1445 11/07/13 0518  HGB 7.6* 7.4* 9.0*    Recent Labs  11/06/13 0356 11/06/13 1445 11/07/13 0518  WBC 10.0  --  10.5  RBC 2.59*  --  3.06*  HCT 23.5* 22.3* 27.3*  PLT 217  --  182    Recent Labs  11/06/13 0356 11/07/13 0518  NA 141 141  K 3.2* 3.7  CL 102 104  CO2 32 31  BUN 13 12  CREATININE 0.67 0.64  GLUCOSE 127* 100*  CALCIUM 8.3* 8.5   No results found for this basename: LABPT, INR,  in the last 72 hours  EXAM General - Patient is Alert, Appropriate and Oriented Extremity - Neurovascular intact Sensation intact distally Dorsiflexion/Plantar flexion intact Dressing/Incision - clean, dry, Aquacel Dressing in place. Motor Function - intact, moving foot and toes well on exam.   Past Medical History  Diagnosis Date  . Hypertension   . Arthritis   . Chronic back pain   . Chest pain 2006  . Asthma   . Shortness of breath     due to  smoking     Assessment/Plan: 3 Days Post-Op Procedure(s) (LRB): LEFT TOTAL HIP ARTHROPLASTY (Left) Active  Problems:   Osteoarthritis of left hip   History of total left hip replacement   Hypokalemia   Acute blood loss anemia  Estimated body mass index is 27.98 kg/(m^2) as calculated from the following:   Height as of this encounter: 5\' 1"  (1.549 m).   Weight as of this encounter: 67.132 kg (148 lb). Up with therapy Plan for discharge tomorrow Discharge to SNF [facility]  DVT Prophylaxis - Xarelto PWB 50% to Left Leg  Avel Peacerew Ernestyne Caldwell, PA-C Orthopaedic Surgery 11/08/2013, 7:59 AM

## 2013-11-08 NOTE — Progress Notes (Signed)
Physical Therapy Treatment Patient Details Name: [name] MRN: [id number] DOB: 09-Apr-1956 Today's Date: 11/08/2013    History of Present Illness Pt was admitted for posterior THA on L    PT Comments    *Pt is progressing with mobility. She walked 56110' with RW and supervision. Performed L THA exercises. Requires verbal cuing to recall posterior hip precautions. **  Follow Up Recommendations  SNF     Equipment Recommendations  None recommended by PT    Recommendations for Other Services OT consult     Precautions / Restrictions Precautions Precautions: Posterior Hip Precaution Booklet Issued: Yes (comment) Precaution Comments: Pt recalls 1/3 THP without cues.  All precautions reviewed Restrictions Weight Bearing Restrictions: Yes LLE Weight Bearing: Partial weight bearing LLE Partial Weight Bearing Percentage or Pounds: 50%    Mobility  Bed Mobility                  Transfers Overall transfer level: Needs assistance Equipment used: Rolling walker (2 wheeled) Transfers: Sit to/from Stand Sit to Stand: Supervision         General transfer comment: cues for UE/LE placement  Ambulation/Gait Ambulation/Gait assistance: Supervision Ambulation Distance (Feet): 110 Feet Assistive device: Rolling walker (2 wheeled) Gait Pattern/deviations: Step-to pattern;Trunk flexed Gait velocity: decr   General Gait Details: cues for flexed neck posture, good sequencing and positioning in RW   Stairs            Wheelchair Mobility    Modified Rankin (Stroke Patients Only)       Balance                                    Cognition Arousal/Alertness: Awake/alert Behavior During Therapy: WFL for tasks assessed/performed Overall Cognitive Status: Within Functional Limits for tasks assessed                      Exercises Total Joint Exercises Ankle Circles/Pumps: AROM;Both;15 reps;Supine Quad Sets: AROM;Both;10  reps;Supine Gluteal Sets: AROM;Both;10 reps;Supine Short Arc Quad: AROM;Left;10 reps;Supine Heel Slides: AAROM;Supine;Left;20 reps Hip ABduction/ADduction: AAROM;Left;Supine;15 reps Long Arc Quad: AROM;Left;10 reps;Seated    General Comments        Pertinent Vitals/Pain *4/10 L hip Premedicated, ice applied**    Home Living                      Prior Function            PT Goals (current goals can now be found in the care plan section) Acute Rehab PT Goals Patient Stated Goal: go to a good place for rehab; be able to play with grandchildren PT Goal Formulation: With patient Time For Goal Achievement: 11/13/13 Potential to Achieve Goals: Good Progress towards PT goals: Progressing toward goals    Frequency  7X/week    PT Plan Current plan remains appropriate    Co-evaluation             End of Session Equipment Utilized During Treatment: Gait belt Activity Tolerance: Patient tolerated treatment well Patient left: in chair;with call bell/phone within reach     Time: 4098-11911053-1125 PT Time Calculation (min): 32 min  Charges:  $Gait Training: 8-22 mins $Therapeutic Exercise: 8-22 mins                    G Codes:      Tamala SerUhlenberg, Eppie Barhorst Kistler 11/08/2013, 11:29 AM  [phone]  

## 2013-11-09 MED ORDER — ZOLPIDEM TARTRATE 5 MG PO TABS
2.5000 mg | ORAL_TABLET | Freq: Once | ORAL | Status: AC
  Administered 2013-11-09: 2.5 mg via ORAL
  Filled 2013-11-09: qty 1

## 2013-11-09 NOTE — Progress Notes (Signed)
Clinical Social Work Department CLINICAL SOCIAL WORK PLACEMENT NOTE 11/09/2013  Patient:  [name],[name]  Account Number:  [id number] Admit date:  11/05/2013  Clinical Social Worker:  Cori RazorJAMIE Orvetta Danielski, LCSW  Date/time:  11/05/2013 03:06 PM  Clinical Social Work is seeking post-discharge placement for this patient at the following level of care:   SKILLED NURSING   (*CSW will update this form in Epic as items are completed)   11/05/2013  Patient/family provided with [facility] Department of Clinical Social Work's list of facilities offering this level of care within the geographic area requested by the patient (or if unable, by the patient's family).  11/05/2013  Patient/family informed of their freedom to choose among providers that offer the needed level of care, that participate in Medicare, Medicaid or managed care program needed by the patient, have an available bed and are willing to accept the patient.    Patient/family informed of [facility]' ownership interest in [facility], as well as of the fact that they are under no obligation to receive care at this facility.  PASARR submitted to EDS on 11/05/2013 PASARR number received on 11/05/2013  FL2 transmitted to all facilities in geographic area requested by pt/family on  11/05/2013 FL2 transmitted to all facilities within larger geographic area on   Patient informed that his/her managed care company has contracts with or will negotiate with  certain facilities, including the following:     Patient/family informed of bed offers received:  11/05/2013 [facility] [place] Physician recommends and patient chooses bed at    [facility]  11/09/2013 Patient to be transferred to facility by CAR Patient and family notified of transfer on 11/09/2013 Name of family member notified:  Son : Ephriam KnucklesClifton  The following physician request were  entered in Epic:   Additional Comments: Pt is in agreement with d/c to SNF today. PT instructed pt / family with safe trans. in and out of car prior to d/c. NSG reviewed d/c summary, avs and scripts. Scripts included in d/c packet. SNF is aware pt will arrive by family car after stopping at home.  Cori RazorJamie Jorden Minchey LCSW [phone]

## 2013-11-09 NOTE — Progress Notes (Signed)
   Subjective: 4 Days Post-Op Procedure(s) (LRB): LEFT TOTAL HIP ARTHROPLASTY (Left) Patient reports pain as mild.   Patient seen in rounds without Dr. Darrelyn HillockGioffre. Patient is well, but has had some minor complaints of pain and swelling in her left thigh.  Feels that therapy is going well. Had some issues with anxiety yesterday but feeling better today.  Plan is to go Skilled nursing facility after hospital stay.  Objective: Vital signs in last 24 hours: Temp:  [98.3 F (36.8 C)-98.9 F (37.2 C)] 98.9 F (37.2 C) (06/15 0500) Pulse Rate:  [65-71] 71 (06/15 0500) Resp:  [16-20] 17 (06/15 0500) BP: (120-123)/(69-78) 120/78 mmHg (06/15 0500) SpO2:  [98 %-99 %] 99 % (06/15 0500)  Intake/Output from previous day:  Intake/Output Summary (Last 24 hours) at 11/09/13 0849 Last data filed at 11/09/13 0831  Gross per 24 hour  Intake   1220 ml  Output      0 ml  Net   1220 ml    Intake/Output this shift: Total I/O In: 500 [P.O.:500] Out: -   Labs:  Recent Labs  11/06/13 1445 11/07/13 0518 11/08/13 0756  HGB 7.4* 9.0* 10.0*    Recent Labs  11/07/13 0518 11/08/13 0756  WBC 10.5 11.5*  RBC 3.06* 3.42*  HCT 27.3* 30.7*  PLT 182 242    Recent Labs  11/07/13 0518  NA 141  K 3.7  CL 104  CO2 31  BUN 12  CREATININE 0.64  GLUCOSE 100*  CALCIUM 8.5    EXAM General - Patient is Alert and Oriented Extremity - Neurologically intact Intact pulses distally Dorsiflexion/Plantar flexion intact Compartment soft Dressing/Incision - clean, dry, no drainage Motor Function - intact, moving foot and toes well on exam.   Past Medical History  Diagnosis Date  . Hypertension   . Arthritis   . Chronic back pain   . Chest pain 2006  . Asthma   . Shortness of breath     due to  smoking     Assessment/Plan: 4 Days Post-Op Procedure(s) (LRB): LEFT TOTAL HIP ARTHROPLASTY (Left) Active Problems:   Osteoarthritis of left hip   History of total left hip replacement  Hypokalemia   Acute blood loss anemia  Estimated body mass index is 27.98 kg/(m^2) as calculated from the following:   Height as of this encounter: 5\' 1"  (1.549 m).   Weight as of this encounter: 67.132 kg (148 lb). Advance diet Up with therapy D/C IV fluids Discharge to SNF  DVT Prophylaxis - Xarelto PWB 50% left LE  Dimitri PedAmber Deamonte Sayegh, PA-C Orthopaedic Surgery 11/09/2013, 8:49 AM

## 2013-11-09 NOTE — Progress Notes (Signed)
Pt plan was to get family to drive to Rehab [employer]( [facility]), pt decided to go home and get a few things after leaving the hospital and called social worker telling her that she decided she didn't want to go to the Rehab and would stay home. After finding this out from our Child psychotherapistsocial worker, I called the patient to see about equipment needs and call the order to set up Home Health PT. Later, I understood from the social worker that later on she decided she couldn't do it at home and would go to Rehab.

## 2013-11-09 NOTE — Progress Notes (Signed)
Occupational Therapy Treatment Patient Details Name: Fransisca KaufmannRenee E Bienkowski MRN: [id number] DOB: March 29, 1956 Today's Date: 11/09/2013    History of present illness Pt was admitted for posterior THA on L      Follow Up Recommendations  SNF          Precautions / Restrictions Restrictions Weight Bearing Restrictions: Yes LLE Weight Bearing: Partial weight bearing LLE Partial Weight Bearing Percentage or Pounds: 50%       Mobility Bed Mobility                  Transfers Overall transfer level: Needs assistance Equipment used: Rolling walker (2 wheeled)   Sit to Stand: Supervision         General transfer comment: cues for UE/LE placement    Balance                                   ADL Overall ADL's : Needs assistance/impaired             Lower Body Bathing: Sit to/from stand;Adhering to hip precautions;With adaptive equipment;Minimal assistance       Lower Body Dressing: Minimal assistance;Sit to/from stand;With adaptive equipment                        Vision                     Perception     Praxis      Cognition   Behavior During Therapy: [facility]FL for tasks assessed/performed Overall Cognitive Status: Within Functional Limits for tasks assessed                       Extremity/Trunk Assessment               Exercises     Shoulder Instructions       General Comments        Home Living                                          Prior Functioning/Environment              Frequency Min 2X/week     Progress Toward Goals  OT Goals(current goals can now be found in the care plan section)  Progress towards OT goals: Progressing toward goals     Plan Discharge plan remains appropriate    Co-evaluation                 End of Session     Activity Tolerance Patient tolerated treatment well   Patient Left in chair;with call bell/phone within reach   Nurse  Communication          Time: 4098-11910931-0955 OT Time Calculation (min): 24 min  Charges: OT General Charges $OT Visit: 1 Procedure OT Treatments $Self Care/Home Management : 23-37 mins  Folashade Gamboa D 11/09/2013, 10:08 AM

## 2013-11-09 NOTE — Progress Notes (Signed)
Physical Therapy Treatment Patient Details Name: Fransisca KaufmannRenee E Rumery MRN: [id number] DOB: [birth year] Today's Date: 11/09/2013    History of Present Illness Pt was admitted for posterior THA on L    PT Comments    POD # 4 pt ready to D/C to SNF.  Amb pt short distance, educated on THP, and performed car transfer.  Follow Up Recommendations  SNF     Equipment Recommendations       Recommendations for Other Services       Precautions / Restrictions Precautions Precautions: Posterior Hip Precaution Comments: Pt recalls 3/3 THP Restrictions Weight Bearing Restrictions: Yes LLE Weight Bearing: Partial weight bearing LLE Partial Weight Bearing Percentage or Pounds: 50%    Mobility  Bed Mobility               General bed mobility comments: Pt OOB in recliner  Transfers Overall transfer level: Needs assistance Equipment used: Rolling walker (2 wheeled) Transfers: Sit to/from Stand Sit to Stand: Supervision         General transfer comment: 25% VC's to avoid hip flex during sit to stand and stand to sit.  Performed car transfer with daughter present for instruction on proper tech to comply with THP.    Ambulation/Gait Ambulation/Gait assistance: Supervision Ambulation Distance (Feet): 20 Feet Assistive device: Rolling walker (2 wheeled) Gait Pattern/deviations: Step-to pattern;Trunk flexed Gait velocity: decr   General Gait Details: cues for flexed neck posture, good sequencing and positioning in RW   Stairs            Wheelchair Mobility    Modified Rankin (Stroke Patients Only)       Balance                                    Cognition Arousal/Alertness: Awake/alert Behavior During Therapy: WFL for tasks assessed/performed Overall Cognitive Status: Within Functional Limits for tasks assessed                      Exercises      General Comments        Pertinent Vitals/Pain C/o "soreness"    Home Living                      Prior Function            PT Goals (current goals can now be found in the care plan section) Progress towards PT goals: Progressing toward goals    Frequency       PT Plan Current plan remains appropriate    Co-evaluation             End of Session Equipment Utilized During Treatment: Gait belt Activity Tolerance: Patient tolerated treatment well Patient left:  (in car)     Time: 1020-1045 PT Time Calculation (min): 25 min  Charges:  $Gait Training: 8-22 mins $Therapeutic Activity: 8-22 mins                    G Codes:      Felecia ShellingLori Brandom Kerwin  PTA [facility]  Acute  Rehab Pager      [phone]

## 2013-11-09 NOTE — Progress Notes (Addendum)
CSW received a called from pt, at her home, stating she will not go to [facility]. " My friends have told me that [facility] is not a good place to go. My friends told me the place smells bad. I'm not going there. " CSW offered to assist with a placement to another ST Rehab facility but pt declined. Pt  requesting HH services. CSW notified nsg of pt's request. NSG will [facility] and [facility].. Pt has CSW's cell # for any additional needed assistance.  Cori RazorJamie Shalay Carder LCSW [phone]  [phone] : R. Vanhook called CSW stating that she thinks she needs ST Rehab. [facility] but would [facility]. SNF contacted and pt's cell # provided. Admissions Coordinator will contact pt to assist with placement. Pt stated that she feels she will be ok at home tonite. CSW strongly recommended returning to the ED if she has any medical concerns. CSW will continue to assist as needed.  Cori RazorJamie Jamilia Jacques LCSW [phone]  [phone] : CSW [facility] with [facility]. They will admit R. Faircloth today for rehab. Spoke with R. Dunckel and she has confirmed this plan. Pt will bring packet to SNF and meds that have been filled today. PA Sport and exercise psychologist( Amber ) has been notified and will cancel HH services and update MD. CSW will contact R. Marinez  in the am to see [facility]   Cori RazorJamie Alejandra Hunt LCSW [phone]

## 2014-01-05 ENCOUNTER — Encounter (HOSPITAL_COMMUNITY): Payer: Self-pay | Admitting: Emergency Medicine

## 2014-01-05 ENCOUNTER — Emergency Department (HOSPITAL_COMMUNITY)
Admission: EM | Admit: 2014-01-05 | Discharge: 2014-01-05 | Disposition: A | Discharge status: Home / Self Care | Payer: BC Managed Care – PPO | Attending: Emergency Medicine | Admitting: Emergency Medicine

## 2014-01-05 DIAGNOSIS — G8929 Other chronic pain: Secondary | ICD-10-CM | POA: Diagnosis not present

## 2014-01-05 DIAGNOSIS — F17200 Nicotine dependence, unspecified, uncomplicated: Secondary | ICD-10-CM | POA: Insufficient documentation

## 2014-01-05 DIAGNOSIS — Z79899 Other long term (current) drug therapy: Secondary | ICD-10-CM | POA: Insufficient documentation

## 2014-01-05 DIAGNOSIS — S199XXA Unspecified injury of neck, initial encounter: Secondary | ICD-10-CM

## 2014-01-05 DIAGNOSIS — IMO0002 Reserved for concepts with insufficient information to code with codable children: Secondary | ICD-10-CM | POA: Insufficient documentation

## 2014-01-05 DIAGNOSIS — Y92410 Unspecified street and highway as the place of occurrence of the external cause: Secondary | ICD-10-CM | POA: Insufficient documentation

## 2014-01-05 DIAGNOSIS — S139XXA Sprain of joints and ligaments of unspecified parts of neck, initial encounter: Secondary | ICD-10-CM | POA: Diagnosis not present

## 2014-01-05 DIAGNOSIS — I10 Essential (primary) hypertension: Secondary | ICD-10-CM | POA: Diagnosis not present

## 2014-01-05 DIAGNOSIS — S0990XA Unspecified injury of head, initial encounter: Secondary | ICD-10-CM | POA: Diagnosis not present

## 2014-01-05 DIAGNOSIS — M129 Arthropathy, unspecified: Secondary | ICD-10-CM | POA: Diagnosis not present

## 2014-01-05 DIAGNOSIS — Y9389 Activity, other specified: Secondary | ICD-10-CM | POA: Insufficient documentation

## 2014-01-05 DIAGNOSIS — S0993XA Unspecified injury of face, initial encounter: Secondary | ICD-10-CM | POA: Diagnosis present

## 2014-01-05 DIAGNOSIS — J45909 Unspecified asthma, uncomplicated: Secondary | ICD-10-CM | POA: Insufficient documentation

## 2014-01-05 DIAGNOSIS — Z7901 Long term (current) use of anticoagulants: Secondary | ICD-10-CM | POA: Insufficient documentation

## 2014-01-05 DIAGNOSIS — S161XXA Strain of muscle, fascia and tendon at neck level, initial encounter: Secondary | ICD-10-CM

## 2014-01-05 MED ORDER — METHOCARBAMOL 500 MG PO TABS
500.0000 mg | ORAL_TABLET | Freq: Once | ORAL | Status: AC
  Administered 2014-01-05: 500 mg via ORAL
  Filled 2014-01-05: qty 1

## 2014-01-05 MED ORDER — IBUPROFEN 800 MG PO TABS
800.0000 mg | ORAL_TABLET | Freq: Once | ORAL | Status: AC
  Administered 2014-01-05: 800 mg via ORAL
  Filled 2014-01-05: qty 1

## 2014-01-05 MED ORDER — METHOCARBAMOL 500 MG PO TABS: 500.0000 mg | ORAL_TABLET | Freq: Two times a day (BID) | ORAL | Status: DC
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
## 2014-01-05 NOTE — Discharge Instructions (Signed)
1. Medications: robaxin, usual home medications 2. Treatment: rest, drink plenty of fluids,  3. Follow Up: Please followup with your primary doctor in 3 days  for discussion of your diagnoses and further evaluation after today's visit; if you do not have a primary care doctor use the resource guide provided to find one;     Cervical Sprain A cervical sprain is an injury in the neck in which the strong, fibrous tissues (ligaments) that connect your neck bones stretch or tear. Cervical sprains can range from mild to severe. Severe cervical sprains can cause the neck vertebrae to be unstable. This can lead to damage of the spinal cord and can result in serious nervous system problems. The amount of time it takes for a cervical sprain to get better depends on the cause and extent of the injury. Most cervical sprains heal in 1 to 3 weeks. CAUSES  Severe cervical sprains may be caused by:   Contact sport injuries (such as from football, rugby, wrestling, hockey, auto racing, gymnastics, diving, martial arts, or boxing).   Motor vehicle collisions.   Whiplash injuries. This is an injury from a sudden forward and backward whipping movement of the head and neck.  Falls.  Mild cervical sprains may be caused by:   Being in an awkward position, such as while cradling a telephone between your ear and shoulder.   Sitting in a chair that does not offer proper support.   Working at a poorly Marketing executive station.   Looking up or down for long periods of time.  SYMPTOMS   Pain, soreness, stiffness, or a burning sensation in the front, back, or sides of the neck. This discomfort may develop immediately after the injury or slowly, 24 hours or more after the injury.   Pain or tenderness directly in the middle of the back of the neck.   Shoulder or upper back pain.   Limited ability to move the neck.   Headache.   Dizziness.   Weakness, numbness, or tingling in the hands or  arms.   Muscle spasms.   Difficulty swallowing or chewing.   Tenderness and swelling of the neck.  DIAGNOSIS  Most of the time your health care provider can diagnose a cervical sprain by taking your history and doing a physical exam. Your health care provider will ask about previous neck injuries and any known neck problems, such as arthritis in the neck. X-rays may be taken to find out if there are any other problems, such as with the bones of the neck. Other tests, such as a CT scan or MRI, may also be needed.  TREATMENT  Treatment depends on the severity of the cervical sprain. Mild sprains can be treated with rest, keeping the neck in place (immobilization), and pain medicines. Severe cervical sprains are immediately immobilized. Further treatment is done to help with pain, muscle spasms, and other symptoms and may include:  Medicines, such as pain relievers, numbing medicines, or muscle relaxants.   Physical therapy. This may involve stretching exercises, strengthening exercises, and posture training. Exercises and improved posture can help stabilize the neck, strengthen muscles, and help stop symptoms from returning.  HOME CARE INSTRUCTIONS   Put ice on the injured area.   Put ice in a plastic bag.   Place a towel between your skin and the bag.   Leave the ice on for 15-20 minutes, 3-4 times a day.   If your injury was severe, you may have been given a cervical  collar to wear. A cervical collar is a two-piece collar designed to keep your neck from moving while it heals.  Do not remove the collar unless instructed by your health care provider.  If you have long hair, keep it outside of the collar.  Ask your health care provider before making any adjustments to your collar. Minor adjustments may be required over time to improve comfort and reduce pressure on your chin or on the back of your head.  Ifyou are allowed to remove the collar for cleaning or bathing, follow  your health care provider's instructions on how to do so safely.  Keep your collar clean by wiping it with mild soap and water and drying it completely. If the collar you have been given includes removable pads, remove them every 1-2 days and hand wash them with soap and water. Allow them to air dry. They should be completely dry before you wear them in the collar.  If you are allowed to remove the collar for cleaning and bathing, wash and dry the skin of your neck. Check your skin for irritation or sores. If you see any, tell your health care provider.  Do not drive while wearing the collar.   Only take over-the-counter or prescription medicines for pain, discomfort, or fever as directed by your health care provider.   Keep all follow-up appointments as directed by your health care provider.   Keep all physical therapy appointments as directed by your health care provider.   Make any needed adjustments to your workstation to promote good posture.   Avoid positions and activities that make your symptoms worse.   Warm up and stretch before being active to help prevent problems.  SEEK MEDICAL CARE IF:   Your pain is not controlled with medicine.   You are unable to decrease your pain medicine over time as planned.   Your activity level is not improving as expected.  SEEK IMMEDIATE MEDICAL CARE IF:   You develop any bleeding.  You develop stomach upset.  You have signs of an allergic reaction to your medicine.   Your symptoms get worse.   You develop new, unexplained symptoms.   You have numbness, tingling, weakness, or paralysis in any part of your body.  MAKE SURE YOU:   Understand these instructions.  Will watch your condition.  Will get help right away if you are not doing well or get worse. Document Released: 03/11/2007 Document Revised: 05/19/2013 Document Reviewed: 11/19/2012 [facility] Patient Information 2015 [place], [place]. This information is not  intended to replace advice given to you by your health care provider. Make sure you discuss any questions you have with your health care provider.
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
## 2014-01-06 NOTE — ED Provider Notes (Signed)
Medical screening examination/treatment/procedure(s) were performed by non-physician practitioner and as supervising physician I was immediately available for consultation/collaboration.  Norma Ignasiak T Ethleen Lormand, MD 01/06/14 0859 

## 2014-03-20 ENCOUNTER — Emergency Department (HOSPITAL_COMMUNITY)
Admission: EM | Admit: 2014-03-20 | Discharge: 2014-03-20 | Disposition: A | Discharge status: Home / Self Care | Payer: BC Managed Care – PPO | Attending: Emergency Medicine | Admitting: Emergency Medicine

## 2014-03-20 ENCOUNTER — Encounter (HOSPITAL_COMMUNITY): Payer: Self-pay | Admitting: Emergency Medicine

## 2014-03-20 DIAGNOSIS — J45909 Unspecified asthma, uncomplicated: Secondary | ICD-10-CM | POA: Diagnosis not present

## 2014-03-20 DIAGNOSIS — R1310 Dysphagia, unspecified: Secondary | ICD-10-CM | POA: Diagnosis not present

## 2014-03-20 DIAGNOSIS — M1990 Unspecified osteoarthritis, unspecified site: Secondary | ICD-10-CM | POA: Insufficient documentation

## 2014-03-20 DIAGNOSIS — Z79899 Other long term (current) drug therapy: Secondary | ICD-10-CM | POA: Insufficient documentation

## 2014-03-20 DIAGNOSIS — K0889 Other specified disorders of teeth and supporting structures: Secondary | ICD-10-CM

## 2014-03-20 DIAGNOSIS — Z9889 Other specified postprocedural states: Secondary | ICD-10-CM | POA: Insufficient documentation

## 2014-03-20 DIAGNOSIS — K088 Other specified disorders of teeth and supporting structures: Secondary | ICD-10-CM | POA: Diagnosis present

## 2014-03-20 DIAGNOSIS — I10 Essential (primary) hypertension: Secondary | ICD-10-CM | POA: Insufficient documentation

## 2014-03-20 DIAGNOSIS — Z720 Tobacco use: Secondary | ICD-10-CM | POA: Insufficient documentation

## 2014-03-20 DIAGNOSIS — G8929 Other chronic pain: Secondary | ICD-10-CM | POA: Diagnosis not present

## 2014-03-20 DIAGNOSIS — K029 Dental caries, unspecified: Secondary | ICD-10-CM | POA: Insufficient documentation

## 2014-03-20 DIAGNOSIS — R110 Nausea: Secondary | ICD-10-CM | POA: Diagnosis not present

## 2014-03-20 MED ORDER — PENICILLIN V POTASSIUM 500 MG PO TABS: 500.0000 mg | ORAL_TABLET | Freq: Four times a day (QID) | ORAL | Status: AC

## 2014-03-20 MED ORDER — HYDROCODONE-ACETAMINOPHEN 5-325 MG PO TABS: 1.0000 | ORAL_TABLET | ORAL | Status: DC | PRN

## 2014-03-20 MED ORDER — OXYCODONE-ACETAMINOPHEN 5-325 MG PO TABS
1.0000 | ORAL_TABLET | Freq: Once | ORAL | Status: AC
  Administered 2014-03-20: 1 via ORAL
  Filled 2014-03-20: qty 1

## 2014-03-20 NOTE — ED Provider Notes (Signed)
CSN: [id number]     Arrival date & time 03/20/14  1123 History   This chart was scribed for a non-physician practitioner, Jinny SandersJoseph Sahily Biddle, working with Linwood DibblesJon Knapp, MD by [place] Peace, ED Scribe. The patient was seen in TR11C/TR11C. The patient's care was started at 1:58 PM.    Chief Complaint  Patient presents with  . Dental Pain      Patient is a 58 y.o. female presenting with tooth pain. The history is provided by the patient. No language interpreter was used.  Dental Pain Associated symptoms: no fever    HPI Comments: [name] is a 58 y.o. female who presents to the Emergency Department complaining of dental pain onset last night. She states history of similar dental pain in 2012 where she was seen here at ED and referred to dentist that extracted various teeth for her. She also complains of slight nausea and trouble swallowing. She notes she has tried taking Ibuprofen and other oral medications without relief. She denies fever. Pt states that she does not have insurance right now. Pt is current everyday smoker.    Past Medical History  Diagnosis Date  . Hypertension   . Arthritis   . Chronic back pain   . Chest pain 2006  . Asthma   . Shortness of breath     due to  smoking    Past Surgical History  Procedure Laterality Date  . Abdominal hysterectomy  1997  . Cardiac catheterization  2006  . Ingrown toenail removal    . Tonsillectomy    . Hemi-microdiscectomy lumbar laminectomy level 1  05/14/2012    Procedure: HEMI-MICRODISCECTOMY LUMBAR LAMINECTOMY LEVEL 1;  Surgeon: Jacki Conesonald A Gioffre, MD;  Location: [facility];  Service: Orthopedics;  Laterality: N/A;  Hemi Laminectomy Microdiscectomy L4 - L5 Central (X-Ray)  . Total hip arthroplasty Left 11/05/2013    Procedure: LEFT TOTAL HIP ARTHROPLASTY;  Surgeon: Jacki Conesonald A Gioffre, MD;  Location: [facility];  Service: Orthopedics;  Laterality: Left;   History reviewed. No pertinent family history. History  Substance Use Topics  .  Smoking status: Current Every Day Smoker -- 1.00 packs/day for 30 years    Types: Cigarettes  . Smokeless tobacco: Never Used  . Alcohol Use: No   OB History   Grav Para Term Preterm Abortions TAB SAB Ect Mult Living                 Review of Systems  Constitutional: Negative for fever.  HENT: Positive for dental problem and trouble swallowing.   Gastrointestinal: Positive for nausea.      Allergies  Review of patient's allergies indicates no known allergies.  Home Medications   Prior to Admission medications   Medication Sig Start Date End Date Taking? Authorizing Provider  albuterol (PROVENTIL HFA;VENTOLIN HFA) 108 (90 BASE) MCG/ACT inhaler Inhale 2 puffs into the lungs every 4 (four) hours as needed for wheezing or shortness of breath. 06/07/13   Hayden Rasmussenavid Mabe, NP  docusate sodium 100 MG CAPS Take 100 mg by mouth 2 (two) times daily. 11/06/13   Amber Tamala SerLauren Constable, PA-C  enalapril-hydrochlorothiazide (VASERETIC) 10-25 MG per tablet Take 1 tablet by mouth daily before breakfast.     Historical Provider, MD  ferrous sulfate 325 (65 FE) MG tablet Take 1 tablet (325 mg total) by mouth 3 (three) times daily after meals. 11/06/13   Amber Tamala SerLauren Constable, PA-C  HYDROcodone-acetaminophen (NORCO/VICODIN) 5-325 MG per tablet Take 1-2 tablets by mouth every 4 (four) hours as  needed for moderate pain or severe pain. 03/20/14   Monte FantasiaJoseph W Effa Yarrow, PA-C  methocarbamol (ROBAXIN) 500 MG tablet Take 1 tablet (500 mg total) by mouth every 6 (six) hours as needed for muscle spasms. 11/06/13   Amber Tamala SerLauren Constable, PA-C  methocarbamol (ROBAXIN) 500 MG tablet Take 1 tablet (500 mg total) by mouth 2 (two) times daily. 01/05/14   Hannah Muthersbaugh, PA-C  omeprazole (PRILOSEC) 20 MG capsule Take 20 mg by mouth daily.    Historical Provider, MD  oxyCODONE-acetaminophen (PERCOCET/ROXICET) 5-325 MG per tablet Take 1-2 tablets by mouth every 4 (four) hours as needed for severe pain. 11/06/13   Amber Tamala SerLauren  Constable, PA-C  penicillin v potassium (VEETID) 500 MG tablet Take 1 tablet (500 mg total) by mouth 4 (four) times daily. 03/20/14 03/27/14  Monte FantasiaJoseph W Tamy Accardo, PA-C  rivaroxaban (XARELTO) 10 MG TABS tablet Take 1 tablet (10 mg total) by mouth daily with breakfast. 11/06/13   Amber Tamala SerLauren Constable, PA-C  zolpidem (AMBIEN) 10 MG tablet Take 10 mg by mouth at bedtime as needed for sleep.    Historical Provider, MD   BP 141/70  Pulse 71  Temp(Src) 98.1 F (36.7 C) (Oral)  Resp 16  Ht 5\' 1"  (1.549 m)  Wt 130 lb (58.968 kg)  BMI 24.58 kg/m2  SpO2 95% Physical Exam  Nursing note and vitals reviewed. Constitutional: She is oriented to person, place, and time. She appears well-developed and well-nourished. No distress.  HENT:  Head: Normocephalic and atraumatic.  Mouth/Throat: Dental caries present.  Dental carries to pre molar on right side. No erythema, purulent drainage or signs of gross abscess. No anterior cervical lymphadenopathy, induration, or signs of cellulitis or PTA.   Eyes: Conjunctivae and EOM are normal.  Neck: Neck supple. No tracheal deviation present.  Cardiovascular: Normal rate.   Pulmonary/Chest: Effort normal. No respiratory distress.  Musculoskeletal: Normal range of motion.  Neurological: She is alert and oriented to person, place, and time.  Skin: Skin is warm and dry.  Psychiatric: She has a normal mood and affect. Her behavior is normal.    ED Course  Procedures (including critical care time) Labs Review Labs Reviewed - No data to display  Results for orders placed during the hospital encounter of 11/05/13  CBC      Result Value Ref Range   WBC 10.0  4.0 - 10.5 K/uL   RBC 2.59 (*) 3.87 - 5.11 MIL/uL   Hemoglobin 7.6 (*) 12.0 - 15.0 g/dL   HCT 16.123.5 (*) 09.636.0 - 04.546.0 %   MCV 90.7  78.0 - 100.0 fL   MCH 29.3  26.0 - 34.0 pg   MCHC 32.3  30.0 - 36.0 g/dL   RDW 40.914.1  81.111.5 - 91.415.5 %   Platelets 217  150 - 400 K/uL  BASIC METABOLIC PANEL      Result Value Ref  Range   Sodium 141  137 - 147 mEq/L   Potassium 3.2 (*) 3.7 - 5.3 mEq/L   Chloride 102  96 - 112 mEq/L   CO2 32  19 - 32 mEq/L   Glucose, Bld 127 (*) 70 - 99 mg/dL   BUN 13  6 - 23 mg/dL   Creatinine, Ser 7.820.67  0.50 - 1.10 mg/dL   Calcium 8.3 (*) 8.4 - 10.5 mg/dL   GFR calc non Af Amer >90  >90 mL/min   GFR calc Af Amer >90  >90 mL/min  HEMOGLOBIN AND HEMATOCRIT, BLOOD      Result  Value Ref Range   Hemoglobin 7.4 (*) 12.0 - 15.0 g/dL   HCT 54.0 (*) 98.1 - 19.1 %  CBC      Result Value Ref Range   WBC 10.5  4.0 - 10.5 K/uL   RBC 3.06 (*) 3.87 - 5.11 MIL/uL   Hemoglobin 9.0 (*) 12.0 - 15.0 g/dL   HCT 47.8 (*) 29.5 - 62.1 %   MCV 89.2  78.0 - 100.0 fL   MCH 29.4  26.0 - 34.0 pg   MCHC 33.0  30.0 - 36.0 g/dL   RDW 30.8  65.7 - 84.6 %   Platelets 182  150 - 400 K/uL  BASIC METABOLIC PANEL      Result Value Ref Range   Sodium 141  137 - 147 mEq/L   Potassium 3.7  3.7 - 5.3 mEq/L   Chloride 104  96 - 112 mEq/L   CO2 31  19 - 32 mEq/L   Glucose, Bld 100 (*) 70 - 99 mg/dL   BUN 12  6 - 23 mg/dL   Creatinine, Ser 9.62  0.50 - 1.10 mg/dL   Calcium 8.5  8.4 - 95.2 mg/dL   GFR calc non Af Amer >90  >90 mL/min   GFR calc Af Amer >90  >90 mL/min  CBC      Result Value Ref Range   WBC 11.5 (*) 4.0 - 10.5 K/uL   RBC 3.42 (*) 3.87 - 5.11 MIL/uL   Hemoglobin 10.0 (*) 12.0 - 15.0 g/dL   HCT 84.1 (*) 32.4 - 40.1 %   MCV 89.8  78.0 - 100.0 fL   MCH 29.2  26.0 - 34.0 pg   MCHC 32.6  30.0 - 36.0 g/dL   RDW 02.7  25.3 - 66.4 %   Platelets 242  150 - 400 K/uL  PREPARE RBC (CROSSMATCH)      Result Value Ref Range   Order Confirmation ORDER PROCESSED BY BLOOD BANK     No results found.    Imaging Review No results found.   EKG Interpretation None     Medications  oxyCODONE-acetaminophen (PERCOCET/ROXICET) 5-325 MG per tablet 1 tablet (1 tablet Oral Given 03/20/14 1136)  oxyCODONE-acetaminophen (PERCOCET/ROXICET) 5-325 MG per tablet 1 tablet (1 tablet Oral Given 03/20/14 1316)    2:02 PM- Treatment plan was discussed with patient who verbalizes understanding and agrees.   MDM   Final diagnoses:  Pain, dental    Patient with toothache.  No gross abscess.  Exam unconcerning for Ludwig's angina or spread of infection.  Will treat with penicillin and pain medicine.  Urged patient to follow-up with dentist.    BP 141/70  Pulse 71  Temp(Src) 98.1 F (36.7 C) (Oral)  Resp 16  Ht 5\' 1"  (1.549 m)  Wt 130 lb (58.968 kg)  BMI 24.58 kg/m2  SpO2 95%  I personally performed the services described in this documentation, which was scribed in my presence. The recorded information has been reviewed and is accurate.  Signed,  Ladona Mow, PA-C 6:32 PM   Monte Fantasia, PA-C 03/20/14 [phone]

## 2014-03-20 NOTE — Discharge Instructions (Signed)
Follow-up with your dentist on Monday. Return to the ER if he develops any high fever, severe pain, trouble swallowing, trouble breathing.  Dental Pain A tooth ache may be caused by cavities (tooth decay). Cavities expose the nerve of the tooth to air and hot or cold temperatures. It may come from an infection or abscess (also called a boil or furuncle) around your tooth. It is also often caused by dental caries (tooth decay). This causes the pain you are having. DIAGNOSIS  Your caregiver can diagnose this problem by exam. TREATMENT   If caused by an infection, it may be treated with medications which kill germs (antibiotics) and pain medications as prescribed by your caregiver. Take medications as directed.  Only take over-the-counter or prescription medicines for pain, discomfort, or fever as directed by your caregiver.  Whether the tooth ache today is caused by infection or dental disease, you should see your dentist as soon as possible for further care. SEEK MEDICAL CARE IF: The exam and treatment you received today has been provided on an emergency basis only. This is not a substitute for complete medical or dental care. If your problem worsens or new problems (symptoms) appear, and you are unable to meet with your dentist, call or return to this location. SEEK IMMEDIATE MEDICAL CARE IF:   You have a fever.  You develop redness and swelling of your face, jaw, or neck.  You are unable to open your mouth.  You have severe pain uncontrolled by pain medicine. MAKE SURE YOU:   Understand these instructions.  Will watch your condition.  Will get help right away if you are not doing well or get worse. Document Released: 05/14/2005 Document Revised: 08/06/2011 Document Reviewed: 12/31/2007 [facility] Patient Information 2015 [employer] [place]. This information is not intended to replace advice given to you by your health care provider. Make sure you discuss any questions you have with your  health care provider.

## 2014-03-20 NOTE — ED Notes (Addendum)
She c/o R lower toothache since last night. She has no insurance or dentist. She tried ibuprofen, bc powder and oragel with no relief.

## 2014-03-20 NOTE — ED Notes (Signed)
Declined W/C at D/C and was escorted to lobby by RN. 

## 2014-03-21 NOTE — ED Provider Notes (Signed)
Medical screening examination/treatment/procedure(s) were performed by non-physician practitioner and as supervising physician I was immediately available for consultation/collaboration.    Jaana Brodt, MD 03/21/14 0710 

## 2014-03-22 ENCOUNTER — Encounter (HOSPITAL_COMMUNITY): Payer: Self-pay | Admitting: Emergency Medicine

## 2014-03-22 ENCOUNTER — Emergency Department (HOSPITAL_COMMUNITY)
Admission: EM | Admit: 2014-03-22 | Discharge: 2014-03-22 | Disposition: A | Discharge status: Home / Self Care | Payer: BC Managed Care – PPO | Attending: Emergency Medicine | Admitting: Emergency Medicine

## 2014-03-22 DIAGNOSIS — J45909 Unspecified asthma, uncomplicated: Secondary | ICD-10-CM | POA: Diagnosis not present

## 2014-03-22 DIAGNOSIS — K029 Dental caries, unspecified: Secondary | ICD-10-CM | POA: Diagnosis not present

## 2014-03-22 DIAGNOSIS — Z792 Long term (current) use of antibiotics: Secondary | ICD-10-CM | POA: Diagnosis not present

## 2014-03-22 DIAGNOSIS — Z79899 Other long term (current) drug therapy: Secondary | ICD-10-CM | POA: Diagnosis not present

## 2014-03-22 DIAGNOSIS — I10 Essential (primary) hypertension: Secondary | ICD-10-CM | POA: Diagnosis not present

## 2014-03-22 DIAGNOSIS — Z720 Tobacco use: Secondary | ICD-10-CM | POA: Diagnosis not present

## 2014-03-22 DIAGNOSIS — K088 Other specified disorders of teeth and supporting structures: Secondary | ICD-10-CM | POA: Diagnosis not present

## 2014-03-22 DIAGNOSIS — K0889 Other specified disorders of teeth and supporting structures: Secondary | ICD-10-CM

## 2014-03-22 DIAGNOSIS — Z9889 Other specified postprocedural states: Secondary | ICD-10-CM | POA: Diagnosis not present

## 2014-03-22 DIAGNOSIS — M1990 Unspecified osteoarthritis, unspecified site: Secondary | ICD-10-CM | POA: Insufficient documentation

## 2014-03-22 DIAGNOSIS — G8929 Other chronic pain: Secondary | ICD-10-CM | POA: Diagnosis not present

## 2014-03-22 MED ORDER — OXYCODONE-ACETAMINOPHEN 5-325 MG PO TABS
1.0000 | ORAL_TABLET | Freq: Once | ORAL | Status: AC
  Administered 2014-03-22: 1 via ORAL
  Filled 2014-03-22: qty 1

## 2014-03-22 MED ORDER — IBUPROFEN 800 MG PO TABS: 800.0000 mg | ORAL_TABLET | Freq: Three times a day (TID) | ORAL | Status: DC | PRN

## 2014-03-22 MED ORDER — KETOROLAC TROMETHAMINE 60 MG/2ML IM SOLN
60.0000 mg | Freq: Once | INTRAMUSCULAR | Status: AC
  Administered 2014-03-22: 60 mg via INTRAMUSCULAR
  Filled 2014-03-22: qty 2

## 2014-03-22 MED ORDER — OXYCODONE-ACETAMINOPHEN 5-325 MG PO TABS: 1.0000 | ORAL_TABLET | ORAL | Status: DC | PRN

## 2014-03-22 NOTE — ED Notes (Signed)
Pt presents to ed with c/o dental pain, was seen 2 days ago for same, sts has dentists appointment on Thursday, but is out of pain meds.

## 2014-03-22 NOTE — ED Provider Notes (Signed)
CSN: [id number]     Arrival date & time 03/22/14  1800 History  This chart was scribed for non-physician practitioner working with Raeford RazorStephen Kohut, MD by Elveria Risingimelie Horne, ED Scribe. This patient was seen in room WTR5/WTR5 and the patient's care was started at 7:23 PM.   Chief Complaint  Patient presents with  . Dental Pain   The history is provided by the patient. No language interpreter was used.   HPI Comments: [name] is a 58 y.o. female who presents to the Emergency Department complaining of persistent dental pain, onset four days ago. Patient was evaluated two ago for the same complaint; she has returned because she has exhausted her pain medication and her scheduled dentist appointment isn't until three days.  Patient given Penicillin, but she states that it has not worked to relieve her pain. Patient endorses that she has been taking her pain medication as directed; she denies complete relief. Patient desires stronger pain medication.   Past Medical History  Diagnosis Date  . Hypertension   . Arthritis   . Chronic back pain   . Chest pain 2006  . Asthma   . Shortness of breath     due to  smoking    Past Surgical History  Procedure Laterality Date  . Abdominal hysterectomy  1997  . Cardiac catheterization  2006  . Ingrown toenail removal    . Tonsillectomy    . Hemi-microdiscectomy lumbar laminectomy level 1  05/14/2012    Procedure: HEMI-MICRODISCECTOMY LUMBAR LAMINECTOMY LEVEL 1;  Surgeon: Jacki Conesonald A Gioffre, MD;  Location: [facility];  Service: Orthopedics;  Laterality: N/A;  Hemi Laminectomy Microdiscectomy L4 - L5 Central (X-Ray)  . Total hip arthroplasty Left 11/05/2013    Procedure: LEFT TOTAL HIP ARTHROPLASTY;  Surgeon: Jacki Conesonald A Gioffre, MD;  Location: [facility];  Service: Orthopedics;  Laterality: Left;   No family history on file. History  Substance Use Topics  . Smoking status: Current Every Day Smoker -- 1.00 packs/day for 30 years    Types: Cigarettes  . Smokeless  tobacco: Never Used  . Alcohol Use: No   OB History   Grav Para Term Preterm Abortions TAB SAB Ect Mult Living                 Review of Systems  Constitutional: Negative for fever and chills.  HENT: Positive for dental problem. Negative for sore throat, trouble swallowing and voice change.       Allergies  Review of patient's allergies indicates no known allergies.  Home Medications   Prior to Admission medications   Medication Sig Start Date End Date Taking? Authorizing Provider  albuterol (PROVENTIL HFA;VENTOLIN HFA) 108 (90 BASE) MCG/ACT inhaler Inhale 2 puffs into the lungs every 4 (four) hours as needed for wheezing or shortness of breath. 06/07/13   Hayden Rasmussenavid Mabe, NP  docusate sodium 100 MG CAPS Take 100 mg by mouth 2 (two) times daily. 11/06/13   Amber Tamala SerLauren Constable, PA-C  enalapril-hydrochlorothiazide (VASERETIC) 10-25 MG per tablet Take 1 tablet by mouth daily before breakfast.     Historical Provider, MD  ferrous sulfate 325 (65 FE) MG tablet Take 1 tablet (325 mg total) by mouth 3 (three) times daily after meals. 11/06/13   Amber Tamala SerLauren Constable, PA-C  HYDROcodone-acetaminophen (NORCO/VICODIN) 5-325 MG per tablet Take 1-2 tablets by mouth every 4 (four) hours as needed for moderate pain or severe pain. 03/20/14   Monte FantasiaJoseph W Mintz, PA-C  methocarbamol (ROBAXIN) 500 MG tablet Take 1  tablet (500 mg total) by mouth every 6 (six) hours as needed for muscle spasms. 11/06/13   Amber Tamala SerLauren Constable, PA-C  methocarbamol (ROBAXIN) 500 MG tablet Take 1 tablet (500 mg total) by mouth 2 (two) times daily. 01/05/14   Hannah Muthersbaugh, PA-C  omeprazole (PRILOSEC) 20 MG capsule Take 20 mg by mouth daily.    Historical Provider, MD  oxyCODONE-acetaminophen (PERCOCET/ROXICET) 5-325 MG per tablet Take 1-2 tablets by mouth every 4 (four) hours as needed for severe pain. 11/06/13   Amber Tamala SerLauren Constable, PA-C  penicillin v potassium (VEETID) 500 MG tablet Take 1 tablet (500 mg total) by mouth 4  (four) times daily. 03/20/14 03/27/14  Monte FantasiaJoseph W Mintz, PA-C  rivaroxaban (XARELTO) 10 MG TABS tablet Take 1 tablet (10 mg total) by mouth daily with breakfast. 11/06/13   Amber Tamala SerLauren Constable, PA-C  zolpidem (AMBIEN) 10 MG tablet Take 10 mg by mouth at bedtime as needed for sleep.    Historical Provider, MD   Triage Vitals: BP 132/62  Pulse 63  Temp(Src) 97.6 F (36.4 C) (Oral)  Resp 16  SpO2 99%  Physical Exam  Nursing note and vitals reviewed. Constitutional: She is oriented to person, place, and time. She appears well-developed and well-nourished. No distress.  HENT:  Head: Normocephalic and atraumatic.  Multiple missing teeth. Signifcant dental decay to right lower first molar and right lower second molar where her pain is located.   Eyes: EOM are normal.  Neck: Neck supple. No tracheal deviation present.  Cardiovascular: Normal rate.   Pulmonary/Chest: Effort normal. No respiratory distress.  Musculoskeletal: Normal range of motion.  Neurological: She is alert and oriented to person, place, and time.  Skin: Skin is warm and dry.  Psychiatric: She has a normal mood and affect. Her behavior is normal.    ED Course  Procedures (including critical care time)  COORDINATION OF CARE: 7:27 PM- Will prescribe pain medication. Discussed treatment plan with patient at bedside and patient agreed to plan.   Patient has a dental appointment.  Patient is advised to return here as needed  I personally performed the services described in this documentation, which was scribed in my presence. The recorded information has been reviewed and is accurate.    Carlyle Dollyhristopher W Taylee Gunnells, PA-C 03/22/14 1952

## 2014-03-22 NOTE — Discharge Instructions (Signed)
Follow-up with the dentist provided.  Return here as needed. 

## 2014-03-22 NOTE — ED Provider Notes (Signed)
Medical screening examination/treatment/procedure(s) were performed by non-physician practitioner and as supervising physician I was immediately available for consultation/collaboration.   EKG Interpretation None       Audianna Landgren, MD 03/22/14 2315 

## 2014-05-17 ENCOUNTER — Encounter (HOSPITAL_COMMUNITY): Payer: Self-pay | Admitting: Emergency Medicine

## 2014-05-17 ENCOUNTER — Emergency Department (INDEPENDENT_AMBULATORY_CARE_PROVIDER_SITE_OTHER)
Admission: EM | Admit: 2014-05-17 | Discharge: 2014-05-17 | Disposition: A | Discharge status: Home / Self Care | Payer: BC Managed Care – PPO | Source: Home / Self Care | Attending: Family Medicine | Admitting: Family Medicine

## 2014-05-17 DIAGNOSIS — J4531 Mild persistent asthma with (acute) exacerbation: Secondary | ICD-10-CM

## 2014-05-17 DIAGNOSIS — J069 Acute upper respiratory infection, unspecified: Secondary | ICD-10-CM

## 2014-05-17 MED ORDER — PREDNISONE 20 MG PO TABS: ORAL_TABLET | ORAL | Status: DC

## 2014-05-17 MED ORDER — METHYLPREDNISOLONE SODIUM SUCC 125 MG IJ SOLR
INTRAMUSCULAR | Status: AC
  Filled 2014-05-17: qty 2

## 2014-05-17 MED ORDER — ALBUTEROL SULFATE (2.5 MG/3ML) 0.083% IN NEBU
INHALATION_SOLUTION | RESPIRATORY_TRACT | Status: AC
  Filled 2014-05-17: qty 3

## 2014-05-17 MED ORDER — ALBUTEROL SULFATE (2.5 MG/3ML) 0.083% IN NEBU
2.5000 mg | INHALATION_SOLUTION | Freq: Once | RESPIRATORY_TRACT | Status: AC
  Administered 2014-05-17: 2.5 mg via RESPIRATORY_TRACT

## 2014-05-17 MED ORDER — ALBUTEROL SULFATE HFA 108 (90 BASE) MCG/ACT IN AERS: 2.0000 | INHALATION_SPRAY | RESPIRATORY_TRACT | Status: DC | PRN

## 2014-05-17 MED ORDER — METHYLPREDNISOLONE SODIUM SUCC 125 MG IJ SOLR
80.0000 mg | Freq: Once | INTRAMUSCULAR | Status: AC
  Administered 2014-05-17: 80 mg via INTRAMUSCULAR

## 2014-05-17 MED ORDER — IPRATROPIUM-ALBUTEROL 0.5-2.5 (3) MG/3ML IN SOLN
3.0000 mL | Freq: Once | RESPIRATORY_TRACT | Status: AC
  Administered 2014-05-17: 3 mL via RESPIRATORY_TRACT

## 2014-05-17 MED ORDER — IPRATROPIUM-ALBUTEROL 0.5-2.5 (3) MG/3ML IN SOLN
RESPIRATORY_TRACT | Status: AC
  Filled 2014-05-17: qty 3

## 2014-05-17 NOTE — Discharge Instructions (Signed)
Bronchospasm Stop Smoking Use your inhaler for cough and wheeze Start your prednisone tomorrow. Lots of liquids COntinue your Alka SeltzerPlus Cold medication A bronchospasm is a spasm or tightening of the airways going into the lungs. During a bronchospasm breathing becomes more difficult because the airways get smaller. When this happens there can be coughing, a whistling sound when breathing (wheezing), and difficulty breathing. Bronchospasm is often associated with asthma, but not all patients who experience a bronchospasm have asthma. CAUSES  A bronchospasm is caused by inflammation or irritation of the airways. The inflammation or irritation may be triggered by:   Allergies (such as to animals, pollen, food, or mold). Allergens that cause bronchospasm may cause wheezing immediately after exposure or many hours later.   Infection. Viral infections are believed to be the most common cause of bronchospasm.   Exercise.   Irritants (such as pollution, cigarette smoke, strong odors, aerosol sprays, and paint fumes).   Weather changes. Winds increase molds and pollens in the air. Rain refreshes the air by washing irritants out. Cold air may cause inflammation.   Stress and emotional upset.  SIGNS AND SYMPTOMS   Wheezing.   Excessive nighttime coughing.   Frequent or severe coughing with a simple cold.   Chest tightness.   Shortness of breath.  DIAGNOSIS  Bronchospasm is usually diagnosed through a history and physical exam. Tests, such as chest X-rays, are sometimes done to look for other conditions. TREATMENT   Inhaled medicines can be given to open up your airways and help you breathe. The medicines can be given using either an inhaler or a nebulizer machine.  Corticosteroid medicines may be given for severe bronchospasm, usually when it is associated with asthma. HOME CARE INSTRUCTIONS   Always have a plan prepared for seeking medical care. Know when to call your  health care provider and local emergency services (911 in the [place] Know where you can access local emergency care.  Only take medicines as directed by your health care provider.  If you were prescribed an inhaler or nebulizer machine, ask your health care provider to explain how to use it correctly. Always use a spacer with your inhaler if you were given one.  It is necessary to remain calm during an attack. Try to relax and breathe more slowly.  Control your home environment in the following ways:   Change your heating and air conditioning filter at least once a month.   Limit your use of fireplaces and wood stoves.  Do not smoke and do not allow smoking in your home.   Avoid exposure to perfumes and fragrances.   Get rid of pests (such as roaches and mice) and their droppings.   Throw away plants if you see mold on them.   Keep your house clean and dust free.   Replace carpet with wood, tile, or vinyl flooring. Carpet can trap dander and dust.   Use allergy-proof pillows, mattress covers, and box spring covers.   Wash bed sheets and blankets every week in hot water and dry them in a dryer.   Use blankets that are made of polyester or cotton.   Wash hands frequently. SEEK MEDICAL CARE IF:   You have muscle aches.   You have chest pain.   The sputum changes from clear or white to yellow, green, gray, or bloody.   The sputum you cough up gets thicker.   There are problems that may be related to the medicine you are given, such as  a rash, itching, swelling, or trouble breathing.  SEEK IMMEDIATE MEDICAL CARE IF:   You have worsening wheezing and coughing even after taking your prescribed medicines.   You have increased difficulty breathing.   You develop severe chest pain. MAKE SURE YOU:   Understand these instructions.  Will watch your condition.  Will get help right away if you are not doing well or get worse. Document Released: 05/17/2003  Document Revised: 05/19/2013 Document Reviewed: 11/03/2012 [facility] Patient Information 2015 [place], [place]. This information is not intended to replace advice given to you by your health care provider. Make sure you discuss any questions you have with your health care provider.  How to Use an Inhaler Using your inhaler correctly is very important. Good technique will make sure that the medicine reaches your lungs.  HOW TO USE AN INHALER:  Take the cap off the inhaler.  If this is the first time using your inhaler, you need to prime it. Shake the inhaler for 5 seconds. Release four puffs into the air, away from your face. Ask your doctor for help if you have questions.  Shake the inhaler for 5 seconds.  Turn the inhaler so the bottle is above the mouthpiece.  Put your pointer finger on top of the bottle. Your thumb holds the bottom of the inhaler.  Open your mouth.  Either hold the inhaler away from your mouth (the width of 2 fingers) or place your lips tightly around the mouthpiece. Ask your doctor which way to use your inhaler.  Breathe out as much air as possible.  Breathe in and push down on the bottle 1 time to release the medicine. You will feel the medicine go in your mouth and throat.  Continue to take a deep breath in very slowly. Try to fill your lungs.  After you have breathed in completely, hold your breath for 10 seconds. This will help the medicine to settle in your lungs. If you cannot hold your breath for 10 seconds, hold it for as long as you can before you breathe out.  Breathe out slowly, through pursed lips. Whistling is an example of pursed lips.  If your doctor has told you to take more than 1 puff, wait at least 15-30 seconds between puffs. This will help you get the best results from your medicine. Do not use the inhaler more than your doctor tells you to.  Put the cap back on the inhaler.  Follow the directions from your doctor or from the inhaler package  about cleaning the inhaler. If you use more than one inhaler, ask your doctor which inhalers to use and what order to use them in. Ask your doctor to help you figure out when you will need to refill your inhaler.  If you use a steroid inhaler, always rinse your mouth with water after your last puff, gargle and spit out the water. Do not swallow the water. GET HELP IF:  The inhaler medicine only partially helps to stop wheezing or shortness of breath.  You are having trouble using your inhaler.  You have some increase in thick spit (phlegm). GET HELP RIGHT AWAY IF:  The inhaler medicine does not help your wheezing or shortness of breath or you have tightness in your chest.  You have dizziness, headaches, or fast heart rate.  You have chills, fever, or night sweats.  You have a large increase of thick spit, or your thick spit is bloody. MAKE SURE YOU:   Understand these  instructions.  Will watch your condition.  Will get help right away if you are not doing well or get worse. Document Released: 02/21/2008 Document Revised: 03/04/2013 Document Reviewed: 12/11/2012 [facility] Patient Information 2015 [place], [place]. This information is not intended to replace advice given to you by your health care provider. Make sure you discuss any questions you have with your health care provider.  Upper Respiratory Infection, Adult An upper respiratory infection (URI) is also sometimes known as the common cold. The upper respiratory tract includes the nose, sinuses, throat, trachea, and bronchi. Bronchi are the airways leading to the lungs. Most people improve within 1 week, but symptoms can last up to 2 weeks. A residual cough may last even longer.  CAUSES Many different viruses can infect the tissues lining the upper respiratory tract. The tissues become irritated and inflamed and often become very moist. Mucus production is also common. A cold is contagious. You can easily spread the virus to others  by oral contact. This includes kissing, sharing a glass, coughing, or sneezing. Touching your mouth or nose and then touching a surface, which is then touched by another person, can also spread the virus. SYMPTOMS  Symptoms typically develop 1 to 3 days after you come in contact with a cold virus. Symptoms vary from person to person. They may include:  Runny nose.  Sneezing.  Nasal congestion.  Sinus irritation.  Sore throat.  Loss of voice (laryngitis).  Cough.  Fatigue.  Muscle aches.  Loss of appetite.  Headache.  Low-grade fever. DIAGNOSIS  You might diagnose your own cold based on familiar symptoms, since most people get a cold 2 to 3 times a year. Your caregiver can confirm this based on your exam. Most importantly, your caregiver can check that your symptoms are not due to another disease such as strep throat, sinusitis, pneumonia, asthma, or epiglottitis. Blood tests, throat tests, and X-rays are not necessary to diagnose a common cold, but they may sometimes be helpful in excluding other more serious diseases. Your caregiver will decide if any further tests are required. RISKS AND COMPLICATIONS  You may be at risk for a more severe case of the common cold if you smoke cigarettes, have chronic heart disease (such as heart failure) or lung disease (such as asthma), or if you have a weakened immune system. The very young and very old are also at risk for more serious infections. Bacterial sinusitis, middle ear infections, and bacterial pneumonia can complicate the common cold. The common cold can worsen asthma and chronic obstructive pulmonary disease (COPD). Sometimes, these complications can require emergency medical care and may be life-threatening. PREVENTION  The best way to protect against getting a cold is to practice good hygiene. Avoid oral or hand contact with people with cold symptoms. Wash your hands often if contact occurs. There is no clear evidence that vitamin C,  vitamin E, echinacea, or exercise reduces the chance of developing a cold. However, it is always recommended to get plenty of rest and practice good nutrition. TREATMENT  Treatment is directed at relieving symptoms. There is no cure. Antibiotics are not effective, because the infection is caused by a virus, not by bacteria. Treatment may include:  Increased fluid intake. Sports drinks offer valuable electrolytes, sugars, and fluids.  Breathing heated mist or steam (vaporizer or shower).  Eating chicken soup or other clear broths, and maintaining good nutrition.  Getting plenty of rest.  Using gargles or lozenges for comfort.  Controlling fevers with ibuprofen or  acetaminophen as directed by your caregiver.  Increasing usage of your inhaler if you have asthma. Zinc gel and zinc lozenges, taken in the first 24 hours of the common cold, can shorten the duration and lessen the severity of symptoms. Pain medicines may help with fever, muscle aches, and throat pain. A variety of non-prescription medicines are available to treat congestion and runny nose. Your caregiver can make recommendations and may suggest nasal or lung inhalers for other symptoms.  HOME CARE INSTRUCTIONS   Only take over-the-counter or prescription medicines for pain, discomfort, or fever as directed by your caregiver.  Use a warm mist humidifier or inhale steam from a shower to increase air moisture. This may keep secretions moist and make it easier to breathe.  Drink enough water and fluids to keep your urine clear or pale yellow.  Rest as needed.  Return to work when your temperature has returned to normal or as your caregiver advises. You may need to stay home longer to avoid infecting others. You can also use a face mask and careful hand washing to prevent spread of the virus. SEEK MEDICAL CARE IF:   After the first few days, you feel you are getting worse rather than better.  You need your caregiver's advice  about medicines to control symptoms.  You develop chills, worsening shortness of breath, or brown or red sputum. These may be signs of pneumonia.  You develop yellow or brown nasal discharge or pain in the face, especially when you bend forward. These may be signs of sinusitis.  You develop a fever, swollen neck glands, pain with swallowing, or white areas in the back of your throat. These may be signs of strep throat. SEEK IMMEDIATE MEDICAL CARE IF:   You have a fever.  You develop severe or persistent headache, ear pain, sinus pain, or chest pain.  You develop wheezing, a prolonged cough, cough up blood, or have a change in your usual mucus (if you have chronic lung disease).  You develop sore muscles or a stiff neck. Document Released: 11/07/2000 Document Revised: 08/06/2011 Document Reviewed: 08/19/2013 [facility] Patient Information 2015 [place]. This information is not intended to replace advice given to you by your health care provider. Make sure you discuss any questions you have with your health care provider.

## 2014-05-17 NOTE — ED Provider Notes (Signed)
CSN: [id number]     Arrival date & time 05/17/14  1544 History   First MD Initiated Contact with Patient 05/17/14 1647     Chief Complaint  Patient presents with  . Wheezing  . Cough   (Consider location/radiation/quality/duration/timing/severity/associated sxs/prior Treatment) HPI Comments: 58 year old female with a history of smoking is complaining of cough for 3 weeks. She says it is worse at night and associated with wheezing. She also has nasal congestion and PND   Past Medical History  Diagnosis Date  . Hypertension   . Arthritis   . Chronic back pain   . Chest pain 2006  . Asthma   . Shortness of breath     due to  smoking    Past Surgical History  Procedure Laterality Date  . Abdominal hysterectomy  1997  . Cardiac catheterization  2006  . Ingrown toenail removal    . Tonsillectomy    . Hemi-microdiscectomy lumbar laminectomy level 1  05/14/2012    Procedure: HEMI-MICRODISCECTOMY LUMBAR LAMINECTOMY LEVEL 1;  Surgeon: Jacki Conesonald A Gioffre, MD;  Location: [facility];  Service: Orthopedics;  Laterality: N/A;  Hemi Laminectomy Microdiscectomy L4 - L5 Central (X-Ray)  . Total hip arthroplasty Left 11/05/2013    Procedure: LEFT TOTAL HIP ARTHROPLASTY;  Surgeon: Jacki Conesonald A Gioffre, MD;  Location: [facility];  Service: Orthopedics;  Laterality: Left;   No family history on file. History  Substance Use Topics  . Smoking status: Current Every Day Smoker -- 1.00 packs/day for 30 years    Types: Cigarettes  . Smokeless tobacco: Never Used  . Alcohol Use: No   OB History    No data available     Review of Systems  Constitutional: Positive for activity change. Negative for fever.  HENT: Positive for congestion, postnasal drip and rhinorrhea. Negative for ear pain.   Respiratory: Positive for cough, shortness of breath and wheezing.   Cardiovascular: Negative for chest pain.  Skin: Negative for rash.    Allergies  Review of patient's allergies indicates no known allergies.  Home  Medications   Prior to Admission medications   Medication Sig Start Date End Date Taking? Authorizing Provider  albuterol (PROVENTIL HFA;VENTOLIN HFA) 108 (90 BASE) MCG/ACT inhaler Inhale 2 puffs into the lungs every 4 (four) hours as needed for wheezing or shortness of breath. 05/17/14   Hayden Rasmussenavid Pessy Delamar, NP  docusate sodium 100 MG CAPS Take 100 mg by mouth 2 (two) times daily. 11/06/13   Amber Tamala SerLauren Constable, PA-C  enalapril-hydrochlorothiazide (VASERETIC) 10-25 MG per tablet Take 1 tablet by mouth daily before breakfast.     Historical Provider, MD  ferrous sulfate 325 (65 FE) MG tablet Take 1 tablet (325 mg total) by mouth 3 (three) times daily after meals. 11/06/13   Amber Tamala SerLauren Constable, PA-C  HYDROcodone-acetaminophen (NORCO/VICODIN) 5-325 MG per tablet Take 1-2 tablets by mouth every 4 (four) hours as needed for moderate pain or severe pain. 03/20/14   Monte FantasiaJoseph W Mintz, PA-C  ibuprofen (ADVIL,MOTRIN) 800 MG tablet Take 1 tablet (800 mg total) by mouth every 8 (eight) hours as needed. 03/22/14   Jamesetta Orleanshristopher W Lawyer, PA-C  methocarbamol (ROBAXIN) 500 MG tablet Take 1 tablet (500 mg total) by mouth every 6 (six) hours as needed for muscle spasms. 11/06/13   Amber Tamala SerLauren Constable, PA-C  methocarbamol (ROBAXIN) 500 MG tablet Take 1 tablet (500 mg total) by mouth 2 (two) times daily. 01/05/14   Hannah Muthersbaugh, PA-C  omeprazole (PRILOSEC) 20 MG capsule Take 20 mg by mouth  daily.    Historical Provider, MD  oxyCODONE-acetaminophen (PERCOCET/ROXICET) 5-325 MG per tablet Take 1-2 tablets by mouth every 4 (four) hours as needed for severe pain. 11/06/13   Amber Tamala SerLauren Constable, PA-C  predniSONE (DELTASONE) 20 MG tablet Take 3 tabs po on first day, 2 tabs second day, 2 tabs third day, 1 tab fourth day, 1 tab 5th day. Take with food. Start 05/18/14. 05/17/14   Hayden Rasmussenavid Wynette Jersey, NP  rivaroxaban (XARELTO) 10 MG TABS tablet Take 1 tablet (10 mg total) by mouth daily with breakfast. 11/06/13   Amber Tamala SerLauren Constable,  PA-C  zolpidem (AMBIEN) 10 MG tablet Take 10 mg by mouth at bedtime as needed for sleep.    Historical Provider, MD   BP 124/76 mmHg  Pulse 70  Temp(Src) 98 F (36.7 C) (Oral)  Resp 18  SpO2 100% Physical Exam  Constitutional: She is oriented to person, place, and time. She appears well-developed and well-nourished. No distress.  HENT:  Mouth/Throat: No oropharyngeal exudate.  Bilat TM's nl OP with clear PND  Eyes: Conjunctivae and EOM are normal.  Neck: Normal range of motion. Neck supple.  Cardiovascular: Normal rate, regular rhythm and normal heart sounds.   Pulmonary/Chest: Effort normal. She has wheezes.  Prolonged expiratory phase  Lymphadenopathy:    She has no cervical adenopathy.  Neurological: She is alert and oriented to person, place, and time. She exhibits normal muscle tone.  Skin: Skin is warm.  Psychiatric: She has a normal mood and affect.  Nursing note and vitals reviewed.   ED Course  Procedures (including critical care time) Labs Review Labs Reviewed - No data to display  Imaging Review No results found.   MDM   1. URI (upper respiratory infection)   2. RAD (reactive airway disease) with wheezing, mild persistent, with acute exacerbation    Post Duoneb 5 mg pt st breathing better. There is a decrease in wheeze although not clear, and improved air movement.  Solumedrol 80 mg IM   Stop Smoking Use your inhaler for cough and wheeze Start your prednisone tomorrow. Lots of liquids COntinue your Alka SeltzerPlus Cold medication       Hayden RasmussenDavid Avalynne Diver, NP 05/17/14 1744

## 2014-05-17 NOTE — ED Notes (Signed)
Pt states that she has had a cough, nasal congestion, and wheezing for a month with no relief and symptoms are worsening.

## 2014-06-03 ENCOUNTER — Emergency Department (HOSPITAL_COMMUNITY): Payer: BLUE CROSS/BLUE SHIELD

## 2014-06-03 ENCOUNTER — Encounter (HOSPITAL_COMMUNITY): Payer: Self-pay | Admitting: Cardiology

## 2014-06-03 ENCOUNTER — Emergency Department (HOSPITAL_COMMUNITY)
Admission: EM | Admit: 2014-06-03 | Discharge: 2014-06-03 | Disposition: A | Discharge status: Home / Self Care | Payer: BLUE CROSS/BLUE SHIELD | Attending: Emergency Medicine | Admitting: Emergency Medicine

## 2014-06-03 DIAGNOSIS — Z720 Tobacco use: Secondary | ICD-10-CM | POA: Diagnosis not present

## 2014-06-03 DIAGNOSIS — M1990 Unspecified osteoarthritis, unspecified site: Secondary | ICD-10-CM | POA: Diagnosis not present

## 2014-06-03 DIAGNOSIS — G8929 Other chronic pain: Secondary | ICD-10-CM | POA: Diagnosis not present

## 2014-06-03 DIAGNOSIS — Z9889 Other specified postprocedural states: Secondary | ICD-10-CM | POA: Insufficient documentation

## 2014-06-03 DIAGNOSIS — R05 Cough: Secondary | ICD-10-CM | POA: Diagnosis present

## 2014-06-03 DIAGNOSIS — Z7901 Long term (current) use of anticoagulants: Secondary | ICD-10-CM | POA: Insufficient documentation

## 2014-06-03 DIAGNOSIS — J40 Bronchitis, not specified as acute or chronic: Secondary | ICD-10-CM

## 2014-06-03 DIAGNOSIS — J45901 Unspecified asthma with (acute) exacerbation: Secondary | ICD-10-CM | POA: Insufficient documentation

## 2014-06-03 DIAGNOSIS — Z79899 Other long term (current) drug therapy: Secondary | ICD-10-CM | POA: Insufficient documentation

## 2014-06-03 DIAGNOSIS — R079 Chest pain, unspecified: Secondary | ICD-10-CM

## 2014-06-03 DIAGNOSIS — I10 Essential (primary) hypertension: Secondary | ICD-10-CM | POA: Diagnosis not present

## 2014-06-03 MED ORDER — HYDROCODONE-ACETAMINOPHEN 5-325 MG PO TABS: 2.0000 | ORAL_TABLET | ORAL | Status: DC | PRN

## 2014-06-03 MED ORDER — HYDROCODONE-ACETAMINOPHEN 5-325 MG PO TABS
1.0000 | ORAL_TABLET | Freq: Once | ORAL | Status: AC
  Administered 2014-06-03: 1 via ORAL
  Filled 2014-06-03: qty 1

## 2014-06-03 MED ORDER — IPRATROPIUM-ALBUTEROL 0.5-2.5 (3) MG/3ML IN SOLN
3.0000 mL | Freq: Once | RESPIRATORY_TRACT | Status: AC
  Administered 2014-06-03: 3 mL via RESPIRATORY_TRACT
  Filled 2014-06-03: qty 3

## 2014-06-03 MED ORDER — PREDNISONE 20 MG PO TABS: ORAL_TABLET | ORAL | Status: DC

## 2014-06-03 MED ORDER — AZITHROMYCIN 250 MG PO TABS: 250.0000 mg | ORAL_TABLET | Freq: Every day | ORAL | Status: DC

## 2014-06-03 NOTE — ED Notes (Signed)
Pt reports that she has had chest pain, cough and weakness for the past month and half. [facility] bu not feeling any better.

## 2014-06-03 NOTE — ED Notes (Signed)
Patient transported to X-ray 

## 2014-06-03 NOTE — ED Provider Notes (Signed)
CSN: [id number]     Arrival date & time 06/03/14  0830 History   First MD Initiated Contact with Patient 06/03/14 [phone]     Chief Complaint  Patient presents with  . Chest Pain  . Cough  . Fatigue     (Consider location/radiation/quality/duration/timing/severity/associated sxs/prior Treatment) HPI Comments: Patient presents to the ER for evaluation of approximately 5 weeks worth of cough, congestion, wheezing and shortness of breath. Patient reports that she was seen in urgent care 2 weeks ago and was told she had a viral illness. She was prescribed an inhaler, but this is not helping. Patient reports that she is continuing to experience fatigue and cough. There is no fever.  Patient is a 59 y.o. female presenting with chest pain and cough.  Chest Pain Associated symptoms: cough and shortness of breath   Cough Associated symptoms: chest pain, shortness of breath and wheezing     Past Medical History  Diagnosis Date  . Hypertension   . Arthritis   . Chronic back pain   . Chest pain 2006  . Asthma   . Shortness of breath     due to  smoking    Past Surgical History  Procedure Laterality Date  . Abdominal hysterectomy  1997  . Cardiac catheterization  2006  . Ingrown toenail removal    . Tonsillectomy    . Hemi-microdiscectomy lumbar laminectomy level 1  05/14/2012    Procedure: HEMI-MICRODISCECTOMY LUMBAR LAMINECTOMY LEVEL 1;  Surgeon: Jacki Cones, MD;  Location: [facility];  Service: Orthopedics;  Laterality: N/A;  Hemi Laminectomy Microdiscectomy L4 - L5 Central (X-Ray)  . Total hip arthroplasty Left 11/05/2013    Procedure: LEFT TOTAL HIP ARTHROPLASTY;  Surgeon: Jacki Cones, MD;  Location: [facility];  Service: Orthopedics;  Laterality: Left;   History reviewed. No pertinent family history. History  Substance Use Topics  . Smoking status: Current Every Day Smoker -- 1.00 packs/day for 30 years    Types: Cigarettes  . Smokeless tobacco: Never Used  . Alcohol Use: No    OB History    No data available     Review of Systems  Respiratory: Positive for cough, shortness of breath and wheezing.   Cardiovascular: Positive for chest pain.  All other systems reviewed and are negative.     Allergies  Review of patient's allergies indicates no known allergies.  Home Medications   Prior to Admission medications   Medication Sig Start Date End Date Taking? Authorizing Provider  albuterol (PROVENTIL HFA;VENTOLIN HFA) 108 (90 BASE) MCG/ACT inhaler Inhale 2 puffs into the lungs every 4 (four) hours as needed for wheezing or shortness of breath. 05/17/14   Hayden Rasmussen, NP  azithromycin (ZITHROMAX) 250 MG tablet Take 1 tablet (250 mg total) by mouth daily. Take first 2 tablets together, then 1 every day until finished. 06/03/14   Gilda Crease, MD  docusate sodium 100 MG CAPS Take 100 mg by mouth 2 (two) times daily. 11/06/13   Amber Tamala Ser, PA-C  enalapril-hydrochlorothiazide (VASERETIC) 10-25 MG per tablet Take 1 tablet by mouth daily before breakfast.     Historical Provider, MD  ferrous sulfate 325 (65 FE) MG tablet Take 1 tablet (325 mg total) by mouth 3 (three) times daily after meals. 11/06/13   Amber Tamala Ser, PA-C  HYDROcodone-acetaminophen (NORCO/VICODIN) 5-325 MG per tablet Take 2 tablets by mouth every 4 (four) hours as needed for moderate pain. 06/03/14   Gilda Crease, MD  ibuprofen (ADVIL,MOTRIN) 800  MG tablet Take 1 tablet (800 mg total) by mouth every 8 (eight) hours as needed. 03/22/14   Jamesetta Orleans Lawyer, PA-C  methocarbamol (ROBAXIN) 500 MG tablet Take 1 tablet (500 mg total) by mouth every 6 (six) hours as needed for muscle spasms. 11/06/13   Amber Tamala Ser, PA-C  methocarbamol (ROBAXIN) 500 MG tablet Take 1 tablet (500 mg total) by mouth 2 (two) times daily. 01/05/14   Hannah Muthersbaugh, PA-C  omeprazole (PRILOSEC) 20 MG capsule Take 20 mg by mouth daily.    Historical Provider, MD  predniSONE (DELTASONE)  20 MG tablet 3 tabs po daily x 3 days, then 2 tabs x 3 days, then 1.5 tabs x 3 days, then 1 tab x 3 days, then 0.5 tabs x 3 days 06/03/14   Gilda Crease, MD  rivaroxaban (XARELTO) 10 MG TABS tablet Take 1 tablet (10 mg total) by mouth daily with breakfast. 11/06/13   Amber Tamala Ser, PA-C  zolpidem (AMBIEN) 10 MG tablet Take 10 mg by mouth at bedtime as needed for sleep.    Historical Provider, MD   BP 115/73 mmHg  Pulse 60  Temp(Src) 98.1 F (36.7 C) (Oral)  Resp 13  Wt 130 lb (58.968 kg)  SpO2 100% Physical Exam  Constitutional: She is oriented to person, place, and time. She appears well-developed and well-nourished. No distress.  HENT:  Head: Normocephalic and atraumatic.  Right Ear: Hearing normal.  Left Ear: Hearing normal.  Nose: Nose normal.  Mouth/Throat: Oropharynx is clear and moist and mucous membranes are normal.  Eyes: Conjunctivae and EOM are normal. Pupils are equal, round, and reactive to light.  Neck: Normal range of motion. Neck supple.  Cardiovascular: Regular rhythm, S1 normal and S2 normal.  Exam reveals no gallop and no friction rub.   No murmur heard. Pulmonary/Chest: Effort normal and breath sounds normal. No respiratory distress. She exhibits no tenderness.  Abdominal: Soft. Normal appearance and bowel sounds are normal. There is no hepatosplenomegaly. There is no tenderness. There is no rebound, no guarding, no tenderness at McBurney's point and negative Murphy's sign. No hernia.  Musculoskeletal: Normal range of motion.  Neurological: She is alert and oriented to person, place, and time. She has normal strength. No cranial nerve deficit or sensory deficit. Coordination normal. GCS eye subscore is 4. GCS verbal subscore is 5. GCS motor subscore is 6.  Skin: Skin is warm, dry and intact. No rash noted. No cyanosis.  Psychiatric: She has a normal mood and affect. Her speech is normal and behavior is normal. Thought content normal.  Nursing note and  vitals reviewed.   ED Course  Procedures (including critical care time) Labs Review Labs Reviewed - No data to display  Imaging Review Dg Chest 2 View  06/03/2014   CLINICAL DATA:  Chest pain and cough since late November, 2015.  EXAM: CHEST  2 VIEW  COMPARISON:  PA and lateral chest 10/29/2013.  FINDINGS: The lungs are clear. Heart size is normal. There is no pneumothorax or pleural effusion. No focal bony abnormality is identified.  IMPRESSION: No acute disease.   Electronically Signed   By: Drusilla Kanner M.D.   On: 06/03/2014 09:47     EKG Interpretation   Date/Time:  Thursday June 03 2014 08:38:13 EST Ventricular Rate:  71 PR Interval:  160 QRS Duration: 84 QT Interval:  370 QTC Calculation: 402 R Axis:   79 Text Interpretation:  Normal sinus rhythm Nonspecific T wave abnormality  Abnormal ECG No  significant change since last tracing Confirmed by [facility]LL[facility]   MD, CHRISTOPHER [phone] on 06/03/2014 9:15:08 AM      MDM   Final diagnoses:  Bronchitis    Patient presents to the ER for evaluation of persistent cough, chest discomfort, wheezing. Patient was previously seen in urgent care and prescribed albuterol and a rapid prednisone taper. She has not improved. Chest x-ray is clear. Patient does have a long smoking history. Likely has some element of COPD. She will be restarted on a prolonged prednisone taper. Continue albuterol. Will add hydrocodone for cough suppression and pain relief. Zithromax prescribed because of the possibility of COPD exacerbation.    Gilda Creasehristopher J. Pollina, MD 06/03/14 1018

## 2014-06-03 NOTE — Discharge Instructions (Signed)

## 2014-09-22 ENCOUNTER — Emergency Department (HOSPITAL_COMMUNITY)
Admission: EM | Admit: 2014-09-22 | Discharge: 2014-09-22 | Disposition: A | Discharge status: Home / Self Care | Payer: BLUE CROSS/BLUE SHIELD | Source: Home / Self Care

## 2014-09-22 NOTE — ED Notes (Signed)
Did not answer when name called 

## 2014-09-25 ENCOUNTER — Emergency Department (HOSPITAL_COMMUNITY)
Admission: EM | Admit: 2014-09-25 | Discharge: 2014-09-25 | Disposition: A | Discharge status: Home / Self Care | Payer: BLUE CROSS/BLUE SHIELD | Attending: Emergency Medicine | Admitting: Emergency Medicine

## 2014-09-25 ENCOUNTER — Encounter (HOSPITAL_COMMUNITY): Payer: Self-pay | Admitting: Emergency Medicine

## 2014-09-25 ENCOUNTER — Emergency Department (HOSPITAL_COMMUNITY): Payer: BLUE CROSS/BLUE SHIELD

## 2014-09-25 DIAGNOSIS — M1990 Unspecified osteoarthritis, unspecified site: Secondary | ICD-10-CM | POA: Insufficient documentation

## 2014-09-25 DIAGNOSIS — I10 Essential (primary) hypertension: Secondary | ICD-10-CM | POA: Insufficient documentation

## 2014-09-25 DIAGNOSIS — Z79899 Other long term (current) drug therapy: Secondary | ICD-10-CM | POA: Insufficient documentation

## 2014-09-25 DIAGNOSIS — Z9889 Other specified postprocedural states: Secondary | ICD-10-CM | POA: Insufficient documentation

## 2014-09-25 DIAGNOSIS — Z720 Tobacco use: Secondary | ICD-10-CM | POA: Diagnosis not present

## 2014-09-25 DIAGNOSIS — R0602 Shortness of breath: Secondary | ICD-10-CM | POA: Diagnosis present

## 2014-09-25 DIAGNOSIS — J45901 Unspecified asthma with (acute) exacerbation: Secondary | ICD-10-CM | POA: Diagnosis not present

## 2014-09-25 DIAGNOSIS — G8929 Other chronic pain: Secondary | ICD-10-CM | POA: Diagnosis not present

## 2014-09-25 DIAGNOSIS — R059 Cough, unspecified: Secondary | ICD-10-CM

## 2014-09-25 DIAGNOSIS — Z7901 Long term (current) use of anticoagulants: Secondary | ICD-10-CM | POA: Insufficient documentation

## 2014-09-25 DIAGNOSIS — R05 Cough: Secondary | ICD-10-CM

## 2014-09-25 DIAGNOSIS — J40 Bronchitis, not specified as acute or chronic: Secondary | ICD-10-CM

## 2014-09-25 MED ORDER — ALBUTEROL SULFATE (2.5 MG/3ML) 0.083% IN NEBU
5.0000 mg | INHALATION_SOLUTION | Freq: Once | RESPIRATORY_TRACT | Status: AC
  Administered 2014-09-25: 5 mg via RESPIRATORY_TRACT
  Filled 2014-09-25: qty 6

## 2014-09-25 MED ORDER — PREDNISONE 20 MG PO TABS: 40.0000 mg | ORAL_TABLET | Freq: Every day | ORAL | Status: DC

## 2014-09-25 MED ORDER — AZITHROMYCIN 250 MG PO TABS: 250.0000 mg | ORAL_TABLET | Freq: Every day | ORAL | Status: DC

## 2014-09-25 MED ORDER — ALBUTEROL SULFATE HFA 108 (90 BASE) MCG/ACT IN AERS: 2.0000 | INHALATION_SPRAY | RESPIRATORY_TRACT | Status: DC | PRN

## 2014-09-25 NOTE — ED Provider Notes (Signed)
CSN: [id number]     Arrival date & time 09/25/14  1037 History   First MD Initiated Contact with Patient 09/25/14 1040     Chief Complaint  Patient presents with  . Shortness of Breath  . Wheezing     (Consider location/radiation/quality/duration/timing/severity/associated sxs/prior Treatment) HPI Comments: 59 year old female who presents with a complaint of a cough and upper respiratory symptoms for approximately 3 weeks, gradual in onset, persistent, gradually worsening, seems to get worse at night, no associated fevers or chills nausea or vomiting or diarrhea. She has been ambulating without difficulty, states she has intermittent sore throat and runny nose that is mostly concerned because of a productive cough of brown thick phlegm. She does smoke cigarettes, she had surgery approximately one year ago, no recent trauma immobilization or new medications. She has been taking over-the-counter medications for the cough without relief and using albuterol inhaler with very intermittent relief  The history is provided by the patient.    Past Medical History  Diagnosis Date  . Hypertension   . Arthritis   . Chronic back pain   . Chest pain 2006  . Asthma   . Shortness of breath     due to  smoking    Past Surgical History  Procedure Laterality Date  . Abdominal hysterectomy  1997  . Cardiac catheterization  2006  . Ingrown toenail removal    . Tonsillectomy    . Hemi-microdiscectomy lumbar laminectomy level 1  05/14/2012    Procedure: HEMI-MICRODISCECTOMY LUMBAR LAMINECTOMY LEVEL 1;  Surgeon: Jacki Cones, MD;  Location: [facility];  Service: Orthopedics;  Laterality: N/A;  Hemi Laminectomy Microdiscectomy L4 - L5 Central (X-Ray)  . Total hip arthroplasty Left 11/05/2013    Procedure: LEFT TOTAL HIP ARTHROPLASTY;  Surgeon: Jacki Cones, MD;  Location: [facility];  Service: Orthopedics;  Laterality: Left;   No family history on file. History  Substance Use Topics  . Smoking status:  Current Every Day Smoker -- 1.00 packs/day for 30 years    Types: Cigarettes  . Smokeless tobacco: Never Used  . Alcohol Use: No   OB History    No data available     Review of Systems  All other systems reviewed and are negative.     Allergies  Review of patient's allergies indicates no known allergies.  Home Medications   Prior to Admission medications   Medication Sig Start Date End Date Taking? Authorizing Provider  enalapril-hydrochlorothiazide (VASERETIC) 10-25 MG per tablet Take 1 tablet by mouth daily before breakfast.    Yes Historical Provider, MD  Multiple Vitamin (MULTIVITAMIN WITH MINERALS) TABS tablet Take 1 tablet by mouth daily.   Yes Historical Provider, MD  omeprazole (PRILOSEC) 20 MG capsule Take 20 mg by mouth daily.   Yes Historical Provider, MD  oxyCODONE-acetaminophen (PERCOCET) 10-325 MG per tablet Take 1 tablet by mouth every 6 (six) hours as needed for pain.  09/23/14  Yes Historical Provider, MD  zolpidem (AMBIEN) 10 MG tablet Take 10 mg by mouth at bedtime as needed for sleep.   Yes Historical Provider, MD  albuterol (PROVENTIL HFA;VENTOLIN HFA) 108 (90 BASE) MCG/ACT inhaler Inhale 2 puffs into the lungs every 4 (four) hours as needed for wheezing or shortness of breath. 09/25/14   Eber Hong, MD  azithromycin (ZITHROMAX Z-PAK) 250 MG tablet Take 1 tablet (250 mg total) by mouth daily.  PO day 1, then  PO days 205 09/25/14   Eber Hong, MD  predniSONE (DELTASONE) 20 MG  tablet Take 2 tablets (40 mg total) by mouth daily. 09/25/14   Eber HongBrian Tanikka Bresnan, MD  rivaroxaban (XARELTO) 10 MG TABS tablet Take 1 tablet (10 mg total) by mouth daily with breakfast. Patient not taking: Reported on 09/25/2014 11/06/13   Amber Constable, PA-C   BP 108/69 mmHg  Pulse 57  Temp(Src) 98.5 F (36.9 C) (Oral)  Resp 20  SpO2 100% Physical Exam  Constitutional: She appears well-developed and well-nourished. No distress.  HENT:  Head: Normocephalic and atraumatic.   Mouth/Throat: Oropharynx is clear and moist. No oropharyngeal exudate.  Oropharynx clear and moist, no erythema exudate asymmetry or hypertrophy, moist mucous membranes, normal phonation  Eyes: Conjunctivae and EOM are normal. Pupils are equal, round, and reactive to light. Right eye exhibits no discharge. Left eye exhibits no discharge. No scleral icterus.  Neck: Normal range of motion. Neck supple. No JVD present. No thyromegaly present.  Very supple neck, no lymphadenopathy  Cardiovascular: Normal rate, regular rhythm, normal heart sounds and intact distal pulses.  Exam reveals no gallop and no friction rub.   No murmur heard. Pulmonary/Chest: Effort normal. No respiratory distress. She has wheezes ( Mild end expiratory wheezing, speaks in full sentences, no distress or accessory muscle use). She has no rales.  Rhonchi present  Abdominal: Soft. Bowel sounds are normal. She exhibits no distension and no mass. There is no tenderness.  Musculoskeletal: Normal range of motion. She exhibits no edema or tenderness.  Lymphadenopathy:    She has no cervical adenopathy.  Neurological: She is alert. Coordination normal.  Skin: Skin is warm and dry. No rash noted. No erythema.  Psychiatric: She has a normal mood and affect. Her behavior is normal.  Nursing note and vitals reviewed.   ED Course  Procedures (including critical care time) Labs Review Labs Reviewed - No data to display  Imaging Review Dg Chest 2 View  09/25/2014   CLINICAL DATA:  Productive cough for 3 weeks accompanied by mid chest tightness and wheezing. History of smoking.  EXAM: CHEST  2 VIEW  COMPARISON:  06/03/2014; 10/29/2013  FINDINGS: Grossly unchanged cardiac silhouette and mediastinal contours. Improved aeration of lung bases. The lungs remain hyperexpanded with flattening of the bilateral hemidiaphragms. No new focal airspace opacities. No pleural effusion or pneumothorax. No evidence of edema. No acute osseus  abnormalities.  IMPRESSION: Hyperexpanded lungs without acute cardiopulmonary disease.   Electronically Signed   By: Simonne ComeJohn  Watts M.D.   On: 09/25/2014 12:10      MDM   Final diagnoses:  Cough  Bronchitis    The patient has signs and symptoms consistent with bronchitis or pneumonia, chest x-ray ordered, albuterol treatment for wheezing, low risk for alternative etiologies including pulmonary embolism, pneumothorax. Rule out pneumonia  X-ray unremarkable, the patient will be treated with medications as below, she has been informed of results, stable for discharge   Meds given in ED:  Medications  albuterol (PROVENTIL) (2.5 MG/3ML) 0.083% nebulizer solution 5 mg (5 mg Nebulization Given 09/25/14 1100)    New Prescriptions   ALBUTEROL (PROVENTIL HFA;VENTOLIN HFA) 108 (90 BASE) MCG/ACT INHALER    Inhale 2 puffs into the lungs every 4 (four) hours as needed for wheezing or shortness of breath.   AZITHROMYCIN (ZITHROMAX Z-PAK) 250 MG TABLET    Take 1 tablet (250 mg total) by mouth daily. 500mg  PO day 1, then 250mg  PO days 205   PREDNISONE (DELTASONE) 20 MG TABLET    Take 2 tablets (40 mg total) by mouth daily.  Eber Hong, MD 09/25/14 1316

## 2014-09-25 NOTE — Discharge Instructions (Signed)
Please call your doctor for a followup appointment within 24-48 hours. When you talk to your doctor please let them know that you were seen in the emergency department and have them acquire all of your records so that they can discuss the findings with you and formulate a treatment plan to fully care for your new and ongoing problems. ° °

## 2014-09-25 NOTE — ED Notes (Signed)
Patient here with complaints of SOB and wheezing x3 weeks. Hx asthma. Inhaler use with no relief.

## 2014-09-30 ENCOUNTER — Emergency Department (HOSPITAL_COMMUNITY)
Admission: EM | Admit: 2014-09-30 | Discharge: 2014-09-30 | Discharge status: Against Medical Advice | Payer: BLUE CROSS/BLUE SHIELD | Attending: Emergency Medicine | Admitting: Emergency Medicine

## 2014-09-30 ENCOUNTER — Emergency Department (HOSPITAL_COMMUNITY): Payer: BLUE CROSS/BLUE SHIELD

## 2014-09-30 ENCOUNTER — Encounter (HOSPITAL_COMMUNITY): Payer: Self-pay | Admitting: Emergency Medicine

## 2014-09-30 DIAGNOSIS — Z7952 Long term (current) use of systemic steroids: Secondary | ICD-10-CM | POA: Diagnosis not present

## 2014-09-30 DIAGNOSIS — Z79899 Other long term (current) drug therapy: Secondary | ICD-10-CM | POA: Diagnosis not present

## 2014-09-30 DIAGNOSIS — M791 Myalgia: Secondary | ICD-10-CM | POA: Insufficient documentation

## 2014-09-30 DIAGNOSIS — Z720 Tobacco use: Secondary | ICD-10-CM | POA: Diagnosis not present

## 2014-09-30 DIAGNOSIS — M1990 Unspecified osteoarthritis, unspecified site: Secondary | ICD-10-CM | POA: Diagnosis not present

## 2014-09-30 DIAGNOSIS — Z7901 Long term (current) use of anticoagulants: Secondary | ICD-10-CM | POA: Insufficient documentation

## 2014-09-30 DIAGNOSIS — G8929 Other chronic pain: Secondary | ICD-10-CM | POA: Insufficient documentation

## 2014-09-30 DIAGNOSIS — J45901 Unspecified asthma with (acute) exacerbation: Secondary | ICD-10-CM | POA: Diagnosis not present

## 2014-09-30 DIAGNOSIS — I10 Essential (primary) hypertension: Secondary | ICD-10-CM | POA: Diagnosis not present

## 2014-09-30 DIAGNOSIS — J209 Acute bronchitis, unspecified: Secondary | ICD-10-CM

## 2014-09-30 DIAGNOSIS — R05 Cough: Secondary | ICD-10-CM | POA: Diagnosis present

## 2014-09-30 MED ORDER — PREDNISONE 20 MG PO TABS
60.0000 mg | ORAL_TABLET | Freq: Once | ORAL | Status: AC
  Administered 2014-09-30: 40 mg via ORAL
  Filled 2014-09-30: qty 3

## 2014-09-30 MED ORDER — ALBUTEROL SULFATE (2.5 MG/3ML) 0.083% IN NEBU
5.0000 mg | INHALATION_SOLUTION | Freq: Once | RESPIRATORY_TRACT | Status: AC
  Administered 2014-09-30: 5 mg via RESPIRATORY_TRACT
  Filled 2014-09-30: qty 6

## 2014-09-30 NOTE — ED Notes (Addendum)
Pt requested to see PA about 5 minutes ago, informed that PA with another pt at this time and would be notified of her request. At this time pt states she doesn't want to wait any more and wants to go home. Pt signed AMA form and left, refused final set of vital signs. Pt ambulatory with steady gait.

## 2014-09-30 NOTE — ED Notes (Signed)
Per pt, states cough for 3 weeks-was here on Saturday and treated-finished antibiotics-not getting better

## 2014-09-30 NOTE — ED Notes (Signed)
Pt states she took Prednisone 20 mg this morning around 9am. Per verbal order from PA, dose to be given now changed to 40 mg.

## 2014-09-30 NOTE — ED Provider Notes (Signed)
CSN: [id number]     Arrival date & time 09/30/14  1323 History  This chart was scribed for non-physician practitioner Everlene FarrierWilliam Nataley Bahri, PA-C, working with Layla MawKristen N Ward, DO by Littie Deedsichard Sun, ED Scribe. This patient was seen in room WTR7/WTR7 and the patient's care was started at 2:41 PM.     Chief Complaint  Patient presents with  . Cough   The history is provided by the patient. No language interpreter was used.   HPI Comments: [name] is a 59 y.o. female with a hx of HTN and asthma who presents to the Emergency Department complaining of gradual onset productive cough that started 3 weeks ago. She also reports having SOB, wheezing, chest tightness that started a few days ago, aching leg pains, fatigue, chills, and feeling "hot". She is unsure if she has had a fever, but she has been feeling hot and cold intermittently. She did have some nasal congestion initially, but no nasal congestion currently. Patient was seen here 5 days ago and was treated with antibiotics and steroids; she did have a breathing treatment at the time with relief. She states she finished her antibiotics but without improvement.  Patient has been taking albuterol inhaler with some relief. She denies hx of heart problems. Patient is a smoker.   Past Medical History  Diagnosis Date  . Hypertension   . Arthritis   . Chronic back pain   . Chest pain 2006  . Asthma   . Shortness of breath     due to  smoking    Past Surgical History  Procedure Laterality Date  . Abdominal hysterectomy  1997  . Cardiac catheterization  2006  . Ingrown toenail removal    . Tonsillectomy    . Hemi-microdiscectomy lumbar laminectomy level 1  05/14/2012    Procedure: HEMI-MICRODISCECTOMY LUMBAR LAMINECTOMY LEVEL 1;  Surgeon: Jacki Conesonald A Gioffre, MD;  Location: [facility];  Service: Orthopedics;  Laterality: N/A;  Hemi Laminectomy Microdiscectomy L4 - L5 Central (X-Ray)  . Total hip arthroplasty Left 11/05/2013    Procedure: LEFT TOTAL HIP  ARTHROPLASTY;  Surgeon: Jacki Conesonald A Gioffre, MD;  Location: [facility];  Service: Orthopedics;  Laterality: Left;   No family history on file. History  Substance Use Topics  . Smoking status: Current Every Day Smoker -- 1.00 packs/day for 30 years    Types: Cigarettes  . Smokeless tobacco: Never Used  . Alcohol Use: No   OB History    No data available     Review of Systems  Constitutional: Positive for chills and fatigue. Negative for fever.  HENT: Negative for congestion, ear pain, sore throat and trouble swallowing.   Eyes: Negative for pain.  Respiratory: Positive for cough, chest tightness, shortness of breath and wheezing.   Cardiovascular: Negative for chest pain and leg swelling.  Gastrointestinal: Negative for nausea, vomiting and abdominal pain.  Genitourinary: Negative for dysuria.  Musculoskeletal: Positive for myalgias. Negative for back pain.  Skin: Negative for rash.  Neurological: Negative for light-headedness.      Allergies  Review of patient's allergies indicates no known allergies.  Home Medications   Prior to Admission medications   Medication Sig Start Date End Date Taking? Authorizing Provider  albuterol (PROVENTIL HFA;VENTOLIN HFA) 108 (90 BASE) MCG/ACT inhaler Inhale 2 puffs into the lungs every 4 (four) hours as needed for wheezing or shortness of breath. 09/25/14   Eber HongBrian Miller, MD  azithromycin (ZITHROMAX Z-PAK) 250 MG tablet Take 1 tablet (250 mg total) by mouth  daily. 500mg  PO day 1, then 250mg  PO days 205 09/25/14   Eber HongBrian Miller, MD  enalapril-hydrochlorothiazide (VASERETIC) 10-25 MG per tablet Take 1 tablet by mouth daily before breakfast.     Historical Provider, MD  Multiple Vitamin (MULTIVITAMIN WITH MINERALS) TABS tablet Take 1 tablet by mouth daily.    Historical Provider, MD  omeprazole (PRILOSEC) 20 MG capsule Take 20 mg by mouth daily.    Historical Provider, MD  oxyCODONE-acetaminophen (PERCOCET) 10-325 MG per tablet Take 1 tablet by mouth  every 6 (six) hours as needed for pain.  09/23/14   Historical Provider, MD  predniSONE (DELTASONE) 20 MG tablet Take 2 tablets (40 mg total) by mouth daily. 09/25/14   Eber HongBrian Miller, MD  rivaroxaban (XARELTO) 10 MG TABS tablet Take 1 tablet (10 mg total) by mouth daily with breakfast. Patient not taking: Reported on 09/25/2014 11/06/13   Dimitri PedAmber Constable, PA-C  zolpidem (AMBIEN) 10 MG tablet Take 10 mg by mouth at bedtime as needed for sleep.    Historical Provider, MD   BP 133/69 mmHg  Pulse 97  Temp(Src) 98.4 F (36.9 C) (Oral)  Resp 16  SpO2 98% Physical Exam  Constitutional: She is oriented to person, place, and time. She appears well-developed and well-nourished. No distress.  HENT:  Head: Normocephalic and atraumatic.  Right Ear: External ear normal.  Left Ear: External ear normal.  Nose: Nose normal.  Mouth/Throat: Oropharynx is clear and moist. No oropharyngeal exudate.  Eyes: Conjunctivae are normal. Pupils are equal, round, and reactive to light. Right eye exhibits no discharge. Left eye exhibits no discharge.  Neck: Neck supple. No JVD present. No tracheal deviation present.  Cardiovascular: Normal rate, regular rhythm, normal heart sounds and intact distal pulses.  Exam reveals no gallop and no friction rub.   No murmur heard. Bilateral radial pulses intact.  Pulmonary/Chest: Effort normal. No respiratory distress. She has wheezes. She exhibits tenderness.  Diminished lung sounds with scattered wheezing bilaterally. Chest is tender to palpation. Patient speaking in full sentences.   Abdominal: Soft. She exhibits no distension. There is no tenderness.  Musculoskeletal: She exhibits no edema.  No lower extremity edema or tenderness.   Lymphadenopathy:    She has no cervical adenopathy.  Neurological: She is alert and oriented to person, place, and time. Coordination normal.  Skin: Skin is warm and dry. No rash noted. She is not diaphoretic.  Psychiatric: She has a normal mood  and affect. Her behavior is normal.  Nursing note and vitals reviewed.   ED Course  Procedures  DIAGNOSTIC STUDIES: Oxygen Saturation is 99% on room air, normal by my interpretation.    COORDINATION OF CARE: 2:51 PM-Discussed treatment plan which includes breathing treatment and CXR with patient/guardian at bedside and patient/guardian agreed to plan.    Labs Review Labs Reviewed - No data to display  Imaging Review Dg Chest 2 View  09/30/2014   CLINICAL DATA:  Chest pain, shortness of breath, wheezing for 3 weeks  EXAM: CHEST  2 VIEW  COMPARISON:  09/25/2014  FINDINGS: The heart size and mediastinal contours are within normal limits. Both lungs are clear. The visualized skeletal structures are unremarkable.  IMPRESSION: No active cardiopulmonary disease.   Electronically Signed   By: Elige KoHetal  Patel   On: 09/30/2014 15:21     EKG Interpretation   Date/Time:  Thursday Sep 30 2014 15:05:22 EDT Ventricular Rate:  54 PR Interval:  159 QRS Duration: 81 QT Interval:  422 QTC Calculation: 400 R Axis:  71 Text Interpretation:  Sinus rhythm Anteroseptal infarct, old No  significant change since last tracing Confirmed by WARD,  DO, KRISTEN  [id number]) on 09/30/2014 3:16:30 PM      Filed Vitals:   09/30/14 1327 09/30/14 1537  BP: 133/69   Pulse: 97   Temp: 98.4 F (36.9 C)   TempSrc: Oral   Resp: 16   SpO2: 99% 98%     MDM   Meds given in ED:  Medications  albuterol (PROVENTIL) (2.5 MG/3ML) 0.083% nebulizer solution 5 mg (5 mg Nebulization Given 09/30/14 1536)  predniSONE (DELTASONE) tablet 60 mg (40 mg Oral Given 09/30/14 1605)    Discharge Medication List as of 09/30/2014  4:45 PM      Final diagnoses:  Acute bronchitis, unspecified organism    This is a 59 y.o. female with a hx of HTN and asthma who presents to the Emergency Department complaining of gradual onset productive cough that started 3 weeks ago. She also reports having SOB, wheezing, chest tightness that started  a few days ago, aching leg pains, and fatigue. The patient was seen in the emergency department approximately 5 days ago and diagnosed with bronchitis. She reports she has completed a azithromycin and prednisone. She reports having an albuterol inhaler at home that helps with her symptoms. She reports continuation of her symptoms. On exam the patient is afebrile and nontoxic appearing. She is speaking in full sentences. Patient has diminished lung sounds bilaterally with scattered wheezes. Due to continuation of symptoms and lung exam will order breathing treatments and chest x-ray and screening EKG due to chest tightness.  Chest x-ray reviewed revealed no active cardiopulmonary disease. I reevaluated the patient and she was still awaiting her breathing treatment. I reminded nursing staff of her breathing treatment. I discussed this patient with Dr. Fayrene Fearing who also suggested getting her prednisone.  I was advised shortly later that the patient had left AGAINST MEDICAL ADVICE. The patient left prior to me being aware she wanted to leave. She left prior me speaking to her.    I personally performed the services described in this documentation, which was scribed in my presence. The recorded information has been reviewed and is accurate.       Everlene Farrier, PA-C 09/30/14 2056  Rolland Porter, MD 10/03/14 [phone]

## 2014-10-20 ENCOUNTER — Emergency Department (HOSPITAL_COMMUNITY)
Admission: EM | Admit: 2014-10-20 | Discharge: 2014-10-20 | Disposition: A | Discharge status: Home / Self Care | Payer: BLUE CROSS/BLUE SHIELD | Attending: Emergency Medicine | Admitting: Emergency Medicine

## 2014-10-20 ENCOUNTER — Emergency Department (HOSPITAL_COMMUNITY): Payer: BLUE CROSS/BLUE SHIELD

## 2014-10-20 ENCOUNTER — Encounter (HOSPITAL_COMMUNITY): Payer: Self-pay | Admitting: Emergency Medicine

## 2014-10-20 DIAGNOSIS — Z9889 Other specified postprocedural states: Secondary | ICD-10-CM | POA: Insufficient documentation

## 2014-10-20 DIAGNOSIS — I10 Essential (primary) hypertension: Secondary | ICD-10-CM | POA: Insufficient documentation

## 2014-10-20 DIAGNOSIS — Z720 Tobacco use: Secondary | ICD-10-CM | POA: Insufficient documentation

## 2014-10-20 DIAGNOSIS — Y998 Other external cause status: Secondary | ICD-10-CM | POA: Insufficient documentation

## 2014-10-20 DIAGNOSIS — Y9289 Other specified places as the place of occurrence of the external cause: Secondary | ICD-10-CM | POA: Diagnosis not present

## 2014-10-20 DIAGNOSIS — Y9389 Activity, other specified: Secondary | ICD-10-CM | POA: Diagnosis not present

## 2014-10-20 DIAGNOSIS — Z7901 Long term (current) use of anticoagulants: Secondary | ICD-10-CM | POA: Diagnosis not present

## 2014-10-20 DIAGNOSIS — Z79899 Other long term (current) drug therapy: Secondary | ICD-10-CM | POA: Diagnosis not present

## 2014-10-20 DIAGNOSIS — M1990 Unspecified osteoarthritis, unspecified site: Secondary | ICD-10-CM | POA: Insufficient documentation

## 2014-10-20 DIAGNOSIS — J45909 Unspecified asthma, uncomplicated: Secondary | ICD-10-CM | POA: Insufficient documentation

## 2014-10-20 DIAGNOSIS — S90121A Contusion of right lesser toe(s) without damage to nail, initial encounter: Secondary | ICD-10-CM | POA: Insufficient documentation

## 2014-10-20 DIAGNOSIS — S99921A Unspecified injury of right foot, initial encounter: Secondary | ICD-10-CM | POA: Diagnosis present

## 2014-10-20 DIAGNOSIS — G8929 Other chronic pain: Secondary | ICD-10-CM | POA: Diagnosis not present

## 2014-10-20 DIAGNOSIS — W228XXA Striking against or struck by other objects, initial encounter: Secondary | ICD-10-CM | POA: Diagnosis not present

## 2014-10-20 MED ORDER — HYDROCODONE-ACETAMINOPHEN 5-325 MG PO TABS
1.0000 | ORAL_TABLET | Freq: Once | ORAL | Status: AC
  Administered 2014-10-20: 1 via ORAL
  Filled 2014-10-20: qty 1

## 2014-10-20 MED ORDER — HYDROCODONE-ACETAMINOPHEN 5-325 MG PO TABS: 1.0000 | ORAL_TABLET | ORAL | Status: DC | PRN

## 2014-10-20 NOTE — ED Notes (Signed)
Pt co fourth right toe, pt hit foot against pole. Toe appears swollen and painful to touch.

## 2014-10-20 NOTE — ED Provider Notes (Signed)
CSN: [id number]     Arrival date & time 10/20/14  4098 History  This chart was scribed for non-physician provider Elpidio Anis, PA-C, working with Rolan Bucco, MD by Phillis Haggis, ED Scribe. This patient was seen in room WTR6/WTR6 and patient care was started at 8:16 PM.     Chief Complaint  Patient presents with  . right fourth toe imjury    The history is provided by the patient. No language interpreter was used.    HPI Comments: [name] is a 59 y.o. female who presents to the Emergency Department complaining of right fourth toe pain onset one day ago. She states that she hit her toe against a pole and is now experiencing pain radiating through the toe up into the foot. Patient denies any other injuries, numbness, weakness, color change, joint swelling or wound to the area. Patient denies any allergies to medications.    Past Medical History  Diagnosis Date  . Hypertension   . Arthritis   . Chronic back pain   . Chest pain 2006  . Asthma   . Shortness of breath     due to  smoking    Past Surgical History  Procedure Laterality Date  . Abdominal hysterectomy  1997  . Cardiac catheterization  2006  . Ingrown toenail removal    . Tonsillectomy    . Hemi-microdiscectomy lumbar laminectomy level 1  05/14/2012    Procedure: HEMI-MICRODISCECTOMY LUMBAR LAMINECTOMY LEVEL 1;  Surgeon: Jacki Cones, MD;  Location: [facility];  Service: Orthopedics;  Laterality: N/A;  Hemi Laminectomy Microdiscectomy L4 - L5 Central (X-Ray)  . Total hip arthroplasty Left 11/05/2013    Procedure: LEFT TOTAL HIP ARTHROPLASTY;  Surgeon: Jacki Cones, MD;  Location: [facility];  Service: Orthopedics;  Laterality: Left;   No family history on file. History  Substance Use Topics  . Smoking status: Current Every Day Smoker -- 1.00 packs/day for 30 years    Types: Cigarettes  . Smokeless tobacco: Never Used  . Alcohol Use: No   OB History    No data available     Review of Systems   Musculoskeletal: Positive for arthralgias. Negative for joint swelling.  Skin: Negative for color change and wound.  Neurological: Negative for weakness and numbness.   Allergies  Review of patient's allergies indicates no known allergies.  Home Medications   Prior to Admission medications   Medication Sig Start Date End Date Taking? Authorizing Provider  albuterol (PROVENTIL HFA;VENTOLIN HFA) 108 (90 BASE) MCG/ACT inhaler Inhale 2 puffs into the lungs every 4 (four) hours as needed for wheezing or shortness of breath. 09/25/14   Eber Hong, MD  azithromycin (ZITHROMAX Z-PAK) 250 MG tablet Take 1 tablet (250 mg total) by mouth daily.  PO day 1, then  PO days 205 09/25/14   Eber Hong, MD  enalapril-hydrochlorothiazide (VASERETIC) 10-25 MG per tablet Take 1 tablet by mouth daily before breakfast.     Historical Provider, MD  Multiple Vitamin (MULTIVITAMIN WITH MINERALS) TABS tablet Take 1 tablet by mouth daily.    Historical Provider, MD  omeprazole (PRILOSEC) 20 MG capsule Take 20 mg by mouth daily.    Historical Provider, MD  oxyCODONE-acetaminophen (PERCOCET) 10-325 MG per tablet Take 1 tablet by mouth every 6 (six) hours as needed for pain.  09/23/14   Historical Provider, MD  predniSONE (DELTASONE) 20 MG tablet Take 2 tablets (40 mg total) by mouth daily. 09/25/14   Eber Hong, MD  rivaroxaban Carlena Hurl)  10 MG TABS tablet Take 1 tablet (10 mg total) by mouth daily with breakfast. Patient not taking: Reported on 09/25/2014 11/06/13   Dimitri PedAmber Constable, PA-C  zolpidem (AMBIEN) 10 MG tablet Take 10 mg by mouth at bedtime as needed for sleep.    Historical Provider, MD   BP 127/83 mmHg  Pulse 70  Temp(Src) 98.5 F (36.9 C) (Oral)  Resp 16  SpO2 97%   Physical Exam  Constitutional: She is oriented to person, place, and time. She appears well-developed and well-nourished. No distress.  HENT:  Head: Normocephalic and atraumatic.  Mouth/Throat: Oropharynx is clear and moist.   Eyes: Conjunctivae and EOM are normal.  Neck: Normal range of motion. Neck supple.  Cardiovascular: Normal rate, regular rhythm and normal heart sounds.   Pulmonary/Chest: Effort normal and breath sounds normal. No respiratory distress.  Musculoskeletal: Normal range of motion. She exhibits no edema.  Right foot 4th toe moderately swollen, tender with tenderness that extends into the 4th metatarsal, no bony deformities; minimal discoloration  Neurological: She is alert and oriented to person, place, and time. No sensory deficit.  Skin: Skin is warm and dry.  Psychiatric: She has a normal mood and affect. Her behavior is normal.  Nursing note and vitals reviewed.   ED Course  Procedures (including critical care time) DIAGNOSTIC STUDIES: Oxygen Saturation is 97% on room air, normal by my interpretation.    COORDINATION OF CARE: 8:17 PM-Discussed treatment plan which includes pain medication and x-ray with pt at bedside and pt agreed to plan.   Labs Review Labs Reviewed - No data to display  Imaging Review Dg Foot Complete Right  10/20/2014   CLINICAL DATA:  Fourth metatarsal injury after trauma yesterday. Initial encounter.  EXAM: RIGHT FOOT COMPLETE - 3+ VIEW  COMPARISON:  06/13/2006  FINDINGS: No evidence acute fracture or dislocation.  Mild first MTP degenerative joint narrowing and spurring.  No acute soft tissue findings.  IMPRESSION: No acute osseous findings.   Electronically Signed   By: Marnee SpringJonathon  Watts M.D.   On: 10/20/2014 21:00     EKG Interpretation None      MDM   Final diagnoses:  None    1. Contusion right toe  Uncomplicated toe contusion without fracture.   I personally performed the services described in this documentation, which was scribed in my presence. The recorded information has been reviewed and is accurate.     Elpidio AnisShari Charles Niese, PA-C 10/20/14 2121  Rolan BuccoMelanie Belfi, MD 10/21/14 [phone]

## 2014-10-20 NOTE — Discharge Instructions (Signed)
Contusion °A contusion is a deep bruise. Contusions are the result of an injury that caused bleeding under the skin. The contusion may turn blue, purple, or yellow. Minor injuries will give you a painless contusion, but more severe contusions may stay painful and swollen for a few weeks.  °CAUSES  °A contusion is usually caused by a blow, trauma, or direct force to an area of the body. °SYMPTOMS  °· Swelling and redness of the injured area. °· Bruising of the injured area. °· Tenderness and soreness of the injured area. °· Pain. °DIAGNOSIS  °The diagnosis can be made by taking a history and physical exam. An X-ray, CT scan, or MRI may be needed to determine if there were any associated injuries, such as fractures. °TREATMENT  °Specific treatment will depend on what area of the body was injured. In general, the best treatment for a contusion is resting, icing, elevating, and applying cold compresses to the injured area. Over-the-counter medicines may also be recommended for pain control. Ask your caregiver what the best treatment is for your contusion. °HOME CARE INSTRUCTIONS  °· Put ice on the injured area. °· Put ice in a plastic bag. °· Place a towel between your skin and the bag. °· Leave the ice on for 15-20 minutes, 3-4 times a day, or as directed by your health care provider. °· Only take over-the-counter or prescription medicines for pain, discomfort, or fever as directed by your caregiver. Your caregiver may recommend avoiding anti-inflammatory medicines (aspirin, ibuprofen, and naproxen) for 48 hours because these medicines may increase bruising. °· Rest the injured area. °· If possible, elevate the injured area to reduce swelling. °SEEK IMMEDIATE MEDICAL CARE IF:  °· You have increased bruising or swelling. °· You have pain that is getting worse. °· Your swelling or pain is not relieved with medicines. °MAKE SURE YOU:  °· Understand these instructions. °· Will watch your condition. °· Will get help right  away if you are not doing well or get worse. °Document Released: 02/21/2005 Document Revised: 05/19/2013 Document Reviewed: 03/19/2011 °ExitCare® Patient Information ©2015 [employer]. This information is not intended to replace advice given to you by your health care provider. Make sure you discuss any questions you have with your health care provider. ° °Cryotherapy °Cryotherapy means treatment with cold. Ice or gel packs can be used to reduce both pain and swelling. Ice is the most helpful within the first 24 to 48 hours after an injury or flare-up from overusing a muscle or joint. Sprains, strains, spasms, burning pain, shooting pain, and aches can all be eased with ice. Ice can also be used when recovering from surgery. Ice is effective, has very few side effects, and is safe for most people to use. °PRECAUTIONS  °Ice is not a safe treatment option for people with: °· Raynaud phenomenon. This is a condition affecting small blood vessels in the extremities. Exposure to cold may cause your problems to return. °· Cold hypersensitivity. There are many forms of cold hypersensitivity, including: °¨ Cold urticaria. Red, itchy hives appear on the skin when the tissues begin to warm after being iced. °¨ Cold erythema. This is a red, itchy rash caused by exposure to cold. °¨ Cold hemoglobinuria. Red blood cells break down when the tissues begin to warm after being iced. The hemoglobin that carry oxygen are passed into the urine because they cannot combine with blood proteins fast enough. °· Numbness or altered sensitivity in the area being iced. °If you have any   of the following conditions, do not use ice until you have discussed cryotherapy with your caregiver: °· Heart conditions, such as arrhythmia, angina, or chronic heart disease. °· High blood pressure. °· Healing wounds or open skin in the area being iced. °· Current infections. °· Rheumatoid arthritis. °· Poor circulation. °· Diabetes. °Ice slows the blood flow  in the region it is applied. This is beneficial when trying to stop inflamed tissues from spreading irritating chemicals to surrounding tissues. However, if you expose your skin to cold temperatures for too long or without the proper protection, you can damage your skin or nerves. Watch for signs of skin damage due to cold. °HOME CARE INSTRUCTIONS °Follow these tips to use ice and cold packs safely. °· Place a dry or damp towel between the ice and skin. A damp towel will cool the skin more quickly, so you may need to shorten the time that the ice is used. °· For a more rapid response, add gentle compression to the ice. °· Ice for no more than 10 to 20 minutes at a time. The bonier the area you are icing, the less time it will take to get the benefits of ice. °· Check your skin after 5 minutes to make sure there are no signs of a poor response to cold or skin damage. °· Rest 20 minutes or more between uses. °· Once your skin is numb, you can end your treatment. You can test numbness by very lightly touching your skin. The touch should be so light that you do not see the skin dimple from the pressure of your fingertip. When using ice, most people will feel these normal sensations in this order: cold, burning, aching, and numbness. °· Do not use ice on someone who cannot communicate their responses to pain, such as small children or people with dementia. °HOW TO MAKE AN ICE PACK °Ice packs are the most common way to use ice therapy. Other methods include ice massage, ice baths, and cryosprays. Muscle creams that cause a cold, tingly feeling do not offer the same benefits that ice offers and should not be used as a substitute unless recommended by your caregiver. °To make an ice pack, do one of the following: °· Place crushed ice or a bag of frozen vegetables in a sealable plastic bag. Squeeze out the excess air. Place this bag inside another plastic bag. Slide the bag into a pillowcase or place a damp towel between  your skin and the bag. °· Mix 3 parts water with 1 part rubbing alcohol. Freeze the mixture in a sealable plastic bag. When you remove the mixture from the freezer, it will be slushy. Squeeze out the excess air. Place this bag inside another plastic bag. Slide the bag into a pillowcase or place a damp towel between your skin and the bag. °SEEK MEDICAL CARE IF: °· You develop white spots on your skin. This may give the skin a blotchy (mottled) appearance. °· Your skin turns blue or pale. °· Your skin becomes waxy or hard. °· Your swelling gets worse. °MAKE SURE YOU:  °· Understand these instructions. °· Will watch your condition. °· Will get help right away if you are not doing well or get worse. °Document Released: 01/08/2011 Document Revised: 09/28/2013 Document Reviewed: 01/08/2011 °ExitCare® Patient Information ©2015 [employer]. This information is not intended to replace advice given to you by your health care provider. Make sure you discuss any questions you have with your health care provider. ° °

## 2014-10-20 NOTE — Progress Notes (Signed)
EDCM spoke to patient at bedside.  Patient noted to have visited the ED 6 times within the last six months.  Patient list as having [employer] insurance without a pcp.  Patient confirms her pcp [address]  She believes the name of the practice [facility].  No further EDCM needs at this time.

## 2014-10-30 ENCOUNTER — Encounter (HOSPITAL_COMMUNITY): Payer: Self-pay | Admitting: Emergency Medicine

## 2014-10-30 ENCOUNTER — Emergency Department (HOSPITAL_COMMUNITY): Payer: BLUE CROSS/BLUE SHIELD

## 2014-10-30 ENCOUNTER — Emergency Department (HOSPITAL_COMMUNITY)
Admission: EM | Admit: 2014-10-30 | Discharge: 2014-10-30 | Discharge status: Against Medical Advice | Payer: BLUE CROSS/BLUE SHIELD | Attending: Emergency Medicine | Admitting: Emergency Medicine

## 2014-10-30 DIAGNOSIS — R079 Chest pain, unspecified: Secondary | ICD-10-CM | POA: Diagnosis present

## 2014-10-30 DIAGNOSIS — R6884 Jaw pain: Secondary | ICD-10-CM | POA: Diagnosis not present

## 2014-10-30 DIAGNOSIS — J45909 Unspecified asthma, uncomplicated: Secondary | ICD-10-CM | POA: Diagnosis not present

## 2014-10-30 DIAGNOSIS — G8929 Other chronic pain: Secondary | ICD-10-CM | POA: Diagnosis not present

## 2014-10-30 DIAGNOSIS — R42 Dizziness and giddiness: Secondary | ICD-10-CM | POA: Insufficient documentation

## 2014-10-30 DIAGNOSIS — Z720 Tobacco use: Secondary | ICD-10-CM | POA: Diagnosis not present

## 2014-10-30 DIAGNOSIS — R51 Headache: Secondary | ICD-10-CM | POA: Insufficient documentation

## 2014-10-30 DIAGNOSIS — I10 Essential (primary) hypertension: Secondary | ICD-10-CM | POA: Insufficient documentation

## 2014-10-30 NOTE — ED Notes (Signed)
Pt reports intermittent dizziness since yesterday with chest pain and jaw pain. Pt also report HA pain behind L eye that started yesterday as well.

## 2014-10-30 NOTE — ED Notes (Signed)
Called pt for lab draw and no answering

## 2014-10-30 NOTE — ED Notes (Signed)
Pt called to go back to room, no response  

## 2014-10-30 NOTE — ED Notes (Signed)
Patient has been called three time, no answer

## 2014-10-30 NOTE — ED Notes (Signed)
Bed: [id number] Expected date:  Expected time:  Means of arrival:  Comments: Triage [place]

## 2015-01-03 ENCOUNTER — Emergency Department (HOSPITAL_COMMUNITY)
Admission: EM | Admit: 2015-01-03 | Discharge: 2015-01-03 | Discharge status: Against Medical Advice | Payer: BLUE CROSS/BLUE SHIELD | Attending: Emergency Medicine | Admitting: Emergency Medicine

## 2015-01-03 ENCOUNTER — Encounter (HOSPITAL_COMMUNITY): Payer: Self-pay | Admitting: Neurology

## 2015-01-03 ENCOUNTER — Emergency Department (HOSPITAL_COMMUNITY): Payer: BLUE CROSS/BLUE SHIELD

## 2015-01-03 DIAGNOSIS — J45909 Unspecified asthma, uncomplicated: Secondary | ICD-10-CM | POA: Diagnosis not present

## 2015-01-03 DIAGNOSIS — G8929 Other chronic pain: Secondary | ICD-10-CM | POA: Insufficient documentation

## 2015-01-03 DIAGNOSIS — I10 Essential (primary) hypertension: Secondary | ICD-10-CM | POA: Insufficient documentation

## 2015-01-03 DIAGNOSIS — R079 Chest pain, unspecified: Secondary | ICD-10-CM | POA: Insufficient documentation

## 2015-01-03 DIAGNOSIS — Z720 Tobacco use: Secondary | ICD-10-CM | POA: Insufficient documentation

## 2015-01-03 LAB — CBC
HCT: 38.1 % (ref 36.0–46.0)
HEMOGLOBIN: 12.3 g/dL (ref 12.0–15.0)
MCH: 29.4 pg (ref 26.0–34.0)
MCHC: 32.3 g/dL (ref 30.0–36.0)
MCV: 91.1 fL (ref 78.0–100.0)
Platelets: 352 10*3/uL (ref 150–400)
RBC: 4.18 MIL/uL (ref 3.87–5.11)
RDW: 13.6 % (ref 11.5–15.5)
WBC: 6.6 10*3/uL (ref 4.0–10.5)

## 2015-01-03 LAB — BASIC METABOLIC PANEL
Anion gap: 8 (ref 5–15)
BUN: 8 mg/dL (ref 6–20)
CO2: 33 mmol/L — AB (ref 22–32)
Calcium: 9.4 mg/dL (ref 8.9–10.3)
Chloride: 98 mmol/L — ABNORMAL LOW (ref 101–111)
Creatinine, Ser: 0.64 mg/dL (ref 0.44–1.00)
GFR calc Af Amer: >60 mL/min (ref >60)
GLUCOSE: 105 mg/dL — AB (ref 65–99)
Potassium: 3.2 mmol/L — ABNORMAL LOW (ref 3.5–5.1)
SODIUM: 139 mmol/L (ref 135–145)

## 2015-01-03 LAB — I-STAT TROPONIN, ED: TROPONIN I, POC: 0 ng/mL (ref 0.00–0.08)

## 2015-01-03 NOTE — ED Notes (Signed)
Pt stated that there are to many people ahead of her and she doesn't want to wait. Pt encouraged to stay and see the Dr. Rock Nephew stated she will come back if symptoms get worse.

## 2015-01-03 NOTE — ED Notes (Signed)
Pt reports today she was volunteering at [employer] when she was sitting she had central cp making her feel like she was going to pass out. Since Friday lower back pain shooting down legs. Pt is a x 4.

## 2015-04-19 ENCOUNTER — Encounter (HOSPITAL_COMMUNITY): Payer: Self-pay | Admitting: Emergency Medicine

## 2015-04-19 ENCOUNTER — Emergency Department (HOSPITAL_COMMUNITY)
Admission: EM | Admit: 2015-04-19 | Discharge: 2015-04-19 | Disposition: A | Discharge status: Home / Self Care | Payer: BLUE CROSS/BLUE SHIELD | Attending: Emergency Medicine | Admitting: Emergency Medicine

## 2015-04-19 DIAGNOSIS — J45909 Unspecified asthma, uncomplicated: Secondary | ICD-10-CM | POA: Insufficient documentation

## 2015-04-19 DIAGNOSIS — I10 Essential (primary) hypertension: Secondary | ICD-10-CM | POA: Insufficient documentation

## 2015-04-19 DIAGNOSIS — R109 Unspecified abdominal pain: Secondary | ICD-10-CM | POA: Insufficient documentation

## 2015-04-19 DIAGNOSIS — F17210 Nicotine dependence, cigarettes, uncomplicated: Secondary | ICD-10-CM | POA: Diagnosis not present

## 2015-04-19 DIAGNOSIS — G8929 Other chronic pain: Secondary | ICD-10-CM | POA: Insufficient documentation

## 2015-04-19 LAB — COMPREHENSIVE METABOLIC PANEL
ALBUMIN: 3.9 g/dL (ref 3.5–5.0)
ALT: 10 U/L — ABNORMAL LOW (ref 14–54)
AST: 17 U/L (ref 15–41)
Alkaline Phosphatase: 83 U/L (ref 38–126)
Anion gap: 7 (ref 5–15)
BUN: 7 mg/dL (ref 6–20)
CHLORIDE: 102 mmol/L (ref 101–111)
CO2: 32 mmol/L (ref 22–32)
Calcium: 9.5 mg/dL (ref 8.9–10.3)
Creatinine, Ser: 0.75 mg/dL (ref 0.44–1.00)
GFR calc Af Amer: >60 mL/min (ref >60)
GFR calc non Af Amer: >60 mL/min (ref >60)
GLUCOSE: 104 mg/dL — AB (ref 65–99)
POTASSIUM: 3.2 mmol/L — AB (ref 3.5–5.1)
SODIUM: 141 mmol/L (ref 135–145)
Total Bilirubin: 0.5 mg/dL (ref 0.3–1.2)
Total Protein: 6.8 g/dL (ref 6.5–8.1)

## 2015-04-19 LAB — CBC
HEMATOCRIT: 38.2 % (ref 36.0–46.0)
Hemoglobin: 12.2 g/dL (ref 12.0–15.0)
MCH: 28.3 pg (ref 26.0–34.0)
MCHC: 31.9 g/dL (ref 30.0–36.0)
MCV: 88.6 fL (ref 78.0–100.0)
Platelets: 371 10*3/uL (ref 150–400)
RBC: 4.31 MIL/uL (ref 3.87–5.11)
RDW: 14 % (ref 11.5–15.5)
WBC: 4.9 10*3/uL (ref 4.0–10.5)

## 2015-04-19 LAB — LIPASE, BLOOD: LIPASE: 28 U/L (ref 11–51)

## 2015-04-19 NOTE — ED Notes (Signed)
No answer x1

## 2015-04-19 NOTE — ED Notes (Signed)
Pt no response x2 for room

## 2015-04-19 NOTE — ED Notes (Signed)
Pt reports mid abdominal pain x 3 weeks. sts she has been vomiting after she eats. Sent here by pcp.

## 2015-06-08 ENCOUNTER — Encounter (HOSPITAL_COMMUNITY): Payer: Self-pay | Admitting: *Deleted

## 2015-06-08 ENCOUNTER — Emergency Department (HOSPITAL_COMMUNITY): Payer: Managed Care, Other (non HMO)

## 2015-06-08 ENCOUNTER — Emergency Department (HOSPITAL_COMMUNITY)
Admission: EM | Admit: 2015-06-08 | Discharge: 2015-06-08 | Disposition: A | Discharge status: Home / Self Care | Payer: Managed Care, Other (non HMO) | Attending: Emergency Medicine | Admitting: Emergency Medicine

## 2015-06-08 DIAGNOSIS — Y9389 Activity, other specified: Secondary | ICD-10-CM | POA: Diagnosis not present

## 2015-06-08 DIAGNOSIS — W1839XA Other fall on same level, initial encounter: Secondary | ICD-10-CM | POA: Diagnosis not present

## 2015-06-08 DIAGNOSIS — I10 Essential (primary) hypertension: Secondary | ICD-10-CM | POA: Insufficient documentation

## 2015-06-08 DIAGNOSIS — Y998 Other external cause status: Secondary | ICD-10-CM | POA: Diagnosis not present

## 2015-06-08 DIAGNOSIS — Z79899 Other long term (current) drug therapy: Secondary | ICD-10-CM | POA: Diagnosis not present

## 2015-06-08 DIAGNOSIS — Y9289 Other specified places as the place of occurrence of the external cause: Secondary | ICD-10-CM | POA: Insufficient documentation

## 2015-06-08 DIAGNOSIS — M1990 Unspecified osteoarthritis, unspecified site: Secondary | ICD-10-CM | POA: Insufficient documentation

## 2015-06-08 DIAGNOSIS — S29001A Unspecified injury of muscle and tendon of front wall of thorax, initial encounter: Secondary | ICD-10-CM | POA: Insufficient documentation

## 2015-06-08 DIAGNOSIS — J45909 Unspecified asthma, uncomplicated: Secondary | ICD-10-CM | POA: Insufficient documentation

## 2015-06-08 DIAGNOSIS — F17210 Nicotine dependence, cigarettes, uncomplicated: Secondary | ICD-10-CM | POA: Diagnosis not present

## 2015-06-08 DIAGNOSIS — R079 Chest pain, unspecified: Secondary | ICD-10-CM

## 2015-06-08 DIAGNOSIS — W19XXXA Unspecified fall, initial encounter: Secondary | ICD-10-CM

## 2015-06-08 DIAGNOSIS — G8929 Other chronic pain: Secondary | ICD-10-CM | POA: Diagnosis not present

## 2015-06-08 LAB — CBC
HEMATOCRIT: 38 % (ref 36.0–46.0)
Hemoglobin: 11.8 g/dL — ABNORMAL LOW (ref 12.0–15.0)
MCH: 28.4 pg (ref 26.0–34.0)
MCHC: 31.1 g/dL (ref 30.0–36.0)
MCV: 91.6 fL (ref 78.0–100.0)
Platelets: 359 10*3/uL (ref 150–400)
RBC: 4.15 MIL/uL (ref 3.87–5.11)
RDW: 15.6 % — AB (ref 11.5–15.5)
WBC: 5.3 10*3/uL (ref 4.0–10.5)

## 2015-06-08 LAB — BASIC METABOLIC PANEL
Anion gap: 9 (ref 5–15)
BUN: 9 mg/dL (ref 6–20)
CO2: 29 mmol/L (ref 22–32)
Calcium: 9.3 mg/dL (ref 8.9–10.3)
Chloride: 102 mmol/L (ref 101–111)
Creatinine, Ser: 0.7 mg/dL (ref 0.44–1.00)
GFR calc Af Amer: >60 mL/min (ref >60)
GLUCOSE: 79 mg/dL (ref 65–99)
Potassium: 3.2 mmol/L — ABNORMAL LOW (ref 3.5–5.1)
Sodium: 140 mmol/L (ref 135–145)

## 2015-06-08 LAB — I-STAT TROPONIN, ED: Troponin i, poc: 0.01 ng/mL (ref 0.00–0.08)

## 2015-06-08 LAB — HEPATIC FUNCTION PANEL
ALBUMIN: 3.5 g/dL (ref 3.5–5.0)
ALT: 10 U/L — ABNORMAL LOW (ref 14–54)
AST: 19 U/L (ref 15–41)
Alkaline Phosphatase: 65 U/L (ref 38–126)
BILIRUBIN DIRECT: 0.2 mg/dL (ref 0.1–0.5)
BILIRUBIN TOTAL: 0.6 mg/dL (ref 0.3–1.2)
Indirect Bilirubin: 0.4 mg/dL (ref 0.3–0.9)
Total Protein: 6.4 g/dL — ABNORMAL LOW (ref 6.5–8.1)

## 2015-06-08 LAB — LIPASE, BLOOD: Lipase: 25 U/L (ref 11–51)

## 2015-06-08 MED ORDER — TRAMADOL HCL 50 MG PO TABS: 100.0000 mg | ORAL_TABLET | Freq: Four times a day (QID) | ORAL | Status: DC | PRN

## 2015-06-08 MED ORDER — ENALAPRIL-HYDROCHLOROTHIAZIDE 10-25 MG PO TABS: 1.0000 | ORAL_TABLET | Freq: Every day | ORAL | Status: DC

## 2015-06-08 MED ORDER — OMEPRAZOLE 20 MG PO CPDR: 20.0000 mg | DELAYED_RELEASE_CAPSULE | Freq: Every day | ORAL | Status: DC

## 2015-06-08 MED ORDER — ASPIRIN 325 MG PO TABS
325.0000 mg | ORAL_TABLET | Freq: Once | ORAL | Status: AC
  Administered 2015-06-08: 325 mg via ORAL
  Filled 2015-06-08: qty 1

## 2015-06-08 MED ORDER — PANTOPRAZOLE SODIUM 40 MG IV SOLR
40.0000 mg | Freq: Once | INTRAVENOUS | Status: AC
  Administered 2015-06-08: 40 mg via INTRAVENOUS
  Filled 2015-06-08: qty 40

## 2015-06-08 MED ORDER — TRAMADOL HCL 50 MG PO TABS
100.0000 mg | ORAL_TABLET | Freq: Once | ORAL | Status: AC
  Administered 2015-06-08: 100 mg via ORAL
  Filled 2015-06-08: qty 2

## 2015-06-08 MED ORDER — ACETAMINOPHEN 500 MG PO TABS
1000.0000 mg | ORAL_TABLET | Freq: Once | ORAL | Status: AC
  Administered 2015-06-08: 1000 mg via ORAL
  Filled 2015-06-08: qty 2

## 2015-06-08 MED ORDER — ASPIRIN 81 MG PO CHEW: 81.0000 mg | CHEWABLE_TABLET | Freq: Every day | ORAL | Status: DC

## 2015-06-08 NOTE — Discharge Instructions (Signed)
Nonspecific Chest Pain  Chest pain can be caused by many different conditions. There is always a chance that your pain could be related to something serious, such as a heart attack or a blood clot in your lungs. Chest pain can also be caused by conditions that are not life-threatening. If you have chest pain, it is very important to follow up with your health care provider. CAUSES  Chest pain can be caused by:  Heartburn.  Pneumonia or bronchitis.  Anxiety or stress.  Inflammation around your heart (pericarditis) or lung (pleuritis or pleurisy).  A blood clot in your lung.  A collapsed lung (pneumothorax). It can develop suddenly on its own (spontaneous pneumothorax) or from trauma to the chest.  Shingles infection (varicella-zoster virus).  Heart attack.  Damage to the bones, muscles, and cartilage that make up your chest wall. This can include:  Bruised bones due to injury.  Strained muscles or cartilage due to frequent or repeated coughing or overwork.  Fracture to one or more ribs.  Sore cartilage due to inflammation (costochondritis). RISK FACTORS  Risk factors for chest pain may include:  Activities that increase your risk for trauma or injury to your chest.  Respiratory infections or conditions that cause frequent coughing.  Medical conditions or overeating that can cause heartburn.  Heart disease or family history of heart disease.  Conditions or health behaviors that increase your risk of developing a blood clot.  Having had chicken pox (varicella zoster). SIGNS AND SYMPTOMS Chest pain can feel like:  Burning or tingling on the surface of your chest or deep in your chest.  Crushing, pressure, aching, or squeezing pain.  Dull or sharp pain that is worse when you move, cough, or take a deep breath.  Pain that is also felt in your back, neck, shoulder, or arm, or pain that spreads to any of these areas. Your chest pain may come and go, or it may stay  constant. DIAGNOSIS Lab tests or other studies may be needed to find the cause of your pain. Your health care provider may have you take a test called an ambulatory ECG (electrocardiogram). An ECG records your heartbeat patterns at the time the test is performed. You may also have other tests, such as:  Transthoracic echocardiogram (TTE). During echocardiography, sound waves are used to create a picture of all of the heart structures and to look at how blood flows through your heart.  Transesophageal echocardiogram (TEE).This is a more advanced imaging test that obtains images from inside your body. It allows your health care provider to see your heart in finer detail.  Cardiac monitoring. This allows your health care provider to monitor your heart rate and rhythm in real time.  Holter monitor. This is a portable device that records your heartbeat and can help to diagnose abnormal heartbeats. It allows your health care provider to track your heart activity for several days, if needed.  Stress tests. These can be done through exercise or by taking medicine that makes your heart beat more quickly.  Blood tests.  Imaging tests. TREATMENT  Your treatment depends on what is causing your chest pain. Treatment may include:  Medicines. These may include:  Acid blockers for heartburn.  Anti-inflammatory medicine.  Pain medicine for inflammatory conditions.  Antibiotic medicine, if an infection is present.  Medicines to dissolve blood clots.  Medicines to treat coronary artery disease.  Supportive care for conditions that do not require medicines. This may include:  Resting.  Applying heat  or cold packs to injured areas.  Limiting activities until pain decreases. HOME CARE INSTRUCTIONS  If you were prescribed an antibiotic medicine, finish it all even if you start to feel better.  Avoid any activities that bring on chest pain.  Do not use any tobacco products, including  cigarettes, chewing tobacco, or electronic cigarettes. If you need help quitting, ask your health care provider.  Do not drink alcohol.  Take medicines only as directed by your health care provider.  Keep all follow-up visits as directed by your health care provider. This is important. This includes any further testing if your chest pain does not go away.  If heartburn is the cause for your chest pain, you may be told to keep your head raised (elevated) while sleeping. This reduces the chance that acid will go from your stomach into your esophagus.  Make lifestyle changes as directed by your health care provider. These may include:  Getting regular exercise. Ask your health care provider to suggest some activities that are safe for you.  Eating a heart-healthy diet. A registered dietitian can help you to learn healthy eating options.  Maintaining a healthy weight.  Managing diabetes, if necessary.  Reducing stress. SEEK MEDICAL CARE IF:  Your chest pain does not go away after treatment.  You have a rash with blisters on your chest.  You have a fever. SEEK IMMEDIATE MEDICAL CARE IF:   Your chest pain is worse.  You have an increasing cough, or you cough up blood.  You have severe abdominal pain.  You have severe weakness.  You faint.  You have chills.  You have sudden, unexplained chest discomfort.  You have sudden, unexplained discomfort in your arms, back, neck, or jaw.  You have shortness of breath at any time.  You suddenly start to sweat, or your skin gets clammy.  You feel nauseous or you vomit.  You suddenly feel light-headed or dizzy.  Your heart begins to beat quickly, or it feels like it is skipping beats. These symptoms may represent a serious problem that is an emergency. Do not wait to see if the symptoms will go away. Get medical help right away. Call your local emergency services (911 in the [place] Do not drive yourself to the hospital.   This  information is not intended to replace advice given to you by your health care provider. Make sure you discuss any questions you have with your health care provider.   Document Released: 02/21/2005 Document Revised: 06/04/2014 Document Reviewed: 12/18/2013 Elsevier Interactive Patient Education 2016 Elsevier Inc. Lumbosacral Strain Lumbosacral strain is a strain of any of the parts that make up your lumbosacral vertebrae. Your lumbosacral vertebrae are the bones that make up the lower third of your backbone. Your lumbosacral vertebrae are held together by muscles and tough, fibrous tissue (ligaments).  CAUSES  A sudden blow to your back can cause lumbosacral strain. Also, anything that causes an excessive stretch of the muscles in the low back can cause this strain. This is typically seen when people exert themselves strenuously, fall, lift heavy objects, bend, or crouch repeatedly. RISK FACTORS  Physically demanding work.  Participation in pushing or pulling sports or sports that require a sudden twist of the back (tennis, golf, baseball).  Weight lifting.  Excessive lower back curvature.  Forward-tilted pelvis.  Weak back or abdominal muscles or both.  Tight hamstrings. SIGNS AND SYMPTOMS  Lumbosacral strain may cause pain in the area of your injury or pain that  moves (radiates) down your leg.  DIAGNOSIS Your health care provider can often diagnose lumbosacral strain through a physical exam. In some cases, you may need tests such as X-ray exams.  TREATMENT  Treatment for your lower back injury depends on many factors that your clinician will have to evaluate. However, most treatment will include the use of anti-inflammatory medicines. HOME CARE INSTRUCTIONS   Avoid hard physical activities (tennis, racquetball, waterskiing) if you are not in proper physical condition for it. This may aggravate or create problems.  If you have a back problem, avoid sports requiring sudden body  movements. Swimming and walking are generally safer activities.  Maintain good posture.  Maintain a healthy weight.  For acute conditions, you may put ice on the injured area.  Put ice in a plastic bag.  Place a towel between your skin and the bag.  Leave the ice on for 20 minutes, 2-3 times a day.  When the low back starts healing, stretching and strengthening exercises may be recommended. SEEK MEDICAL CARE IF:  Your back pain is getting worse.  You experience severe back pain not relieved with medicines. SEEK IMMEDIATE MEDICAL CARE IF:   You have numbness, tingling, weakness, or problems with the use of your arms or legs.  There is a change in bowel or bladder control.  You have increasing pain in any area of the body, including your belly (abdomen).  You notice shortness of breath, dizziness, or feel faint.  You feel sick to your stomach (nauseous), are throwing up (vomiting), or become sweaty.  You notice discoloration of your toes or legs, or your feet get very cold. MAKE SURE YOU:   Understand these instructions.  Will watch your condition.  Will get help right away if you are not doing well or get worse.   This information is not intended to replace advice given to you by your health care provider. Make sure you discuss any questions you have with your health care provider.   Document Released: 02/21/2005 Document Revised: 06/04/2014 Document Reviewed: 12/31/2012 Elsevier Interactive Patient Education 2016 [employer].  Emergency Department Resource Guide 1) Find a Doctor and Pay Out of Pocket Although you won't have to find out who is covered by your insurance plan, it is a good idea to ask around and get recommendations. You will then need to call the office and see if the doctor you have chosen will accept you as a new patient and what types of options they offer for patients who are self-pay. Some doctors offer discounts or will set up payment plans for  their patients who do not have insurance, but you will need to ask so you aren't surprised when you get to your appointment.  2) Contact Your Local Health Department Not all health departments have doctors that can see patients for sick visits, but many do, so it is worth a call to see if yours does. If you don't know where your local health department is, you can check in your phone book. The CDC also has a tool to help you locate your state's health department, and many state websites also have listings of all of their local health departments.  3) Find a Walk-in Clinic If your illness is not likely to be very severe or complicated, you may want to try a walk in clinic. These are popping up all over the country in pharmacies, drugstores, and shopping centers. They're usually staffed by nurse practitioners or physician assistants that have been  trained to treat common illnesses and complaints. They're usually fairly quick and inexpensive. However, if you have serious medical issues or chronic medical problems, these are probably not your best option.  No Primary Care Doctor: - Call Health Connect at  [phone] - they can help you locate a primary care doctor that  accepts your insurance, provides certain services, etc. - Physician Referral Service- [phone]  Chronic Pain Problems: Organization         Address  Phone   Notes  [facility]  [phone] Patients need to be referred by their primary care doctor.   Medication Assistance: Organization         Address  Phone   Notes  [facility] Medication [facility] [address] [place], [place] [address] [phone] --Must be a resident of [facility] -- Must have NO insurance coverage whatsoever (no Medicaid/ Medicare, etc.) -- The pt. MUST have a primary care doctor that directs their care regularly and follows them in the community   [facility]ist  [phone]   [employer]  [phone]    Agencies that provide inexpensive medical care: Organization         Address  Phone   Notes  [facility]  [phone]   [facility]    [phone]   [facility] [address] [place], [place] [address] [phone]   [facility] [address] [place] [phone]   Planned Parenthood    [phone]   [facility]    [phone]   [facility] and [facility]  [address], [place] Phone:  [phone], Fax:  [phone] Hours of Operation:  9 am - 6 pm, M-F.  Also accepts Medicaid/Medicare and self-pay.  [facility] [place] [facility]  [address], [place] Phone: [phone], Fax: [phone]. Hours of Operation:  8:30 am - 5:30 pm, M-F.  Also accepts Medicaid and self-pay.  [facility] [place] [address], [place] Phone: [phone]   [facility] [address] Natasha Bence [place], [place] [phone], Ext. [phone] Mondays & Thursdays: 7-9 AM.  First 15 patients are seen on a first come, first serve basis.    Medicaid-accepting [facility] Providers:  Organization         Address  Phone   Notes  [facility] [address], [place] [phone] Also accepts self-pay patients.  [facility] [address] Laurell Josephs [place], [place]  [phone]   [facility] [address], [place] [phone]   [facility] [address], [place] [phone]   Renaye Rakers [address], [place]   [phone] Only accepts [place] patients after they have their name applied to their card.   Self-Pay (no insurance) in [facility]:  Organization         Address  Phone   Notes  Sickle Cell Patients, [facility] [address] [place], [place] [phone]   [facility] [address] [place], [place] [phone]   [facility] [place]  [address], [place] [phone]   [facility]/Dr. Osei-Bonsu  [address] [place] or [place]  [address], [place] [phone]) [phone] Phone number for both [facility] and [place] locations is the same.  [facility]gent [facility] and [facility] [address], [place] [phone]   [facility] [address] [place] [address], [place] or [address] [phone]) [phone]   [facility] [address], [place] [phone]) [phone], phone; [phone]) [phone], fax Sees patients 1st and 3rd Saturday of every month.  Must not qualify for public or private insurance (i.e. Medicaid, Medicare, [place] Health Choice, Veterans' Benefits)  Household income should be no more than 200% of the poverty level The clinic cannot treat you if you are pregnant or think you are pregnant  Sexually transmitted diseases are not treated at the clinic.    Dental Care: Organization         Address  Phone  Notes  [facility] [address], [place] [phone]) [phone] Accepts children up to age 62 who are enrolled in [place] or [place] Health Choice; pregnant women with a Medicaid card; and children who have applied for Medicaid or [place] Health Choice, but were declined, whose parents can pay a reduced fee at time of service.  [facility] [place]  [address], [place] [phone] Accepts children up to age 62 who are enrolled in [place] or [place] Health Choice; pregnant women with a Medicaid card; and children who have applied for Medicaid or [place] Health Choice, but were declined, whose parents can pay a reduced fee at time of service.  [facility]  [address], [place] [phone]) [phone] Patients are  seen by appointment only. Walk-ins are not accepted. [facility] will see patients 62 years of age and older. Monday - Tuesday (8am-5pm) Most Wednesdays (8:30-5pm) $30 per visit, cash only  [facility]  [address], [facility] [phone]) [phone] Patients are seen by appointment only. Walk-ins are not accepted. [facility] will see patients 62 years of age and older. One Wednesday Evening (Monthly: Volunteer Based).  $30 per visit, cash only  [facility]  [phone]) [phone] for adults; Children under age 354, call Graduate Pediatric Dentistry at [phone]) [phone]. Children aged 64-14, please call [phone]) [phone] to request a pediatric application.  Dental services are provided in all areas of dental care including fillings, crowns and bridges, complete and partial dentures, implants, gum treatment, root canals, and extractions. Preventive care is also provided. Treatment is provided to both adults and children. Patients are selected via a lottery and there is often a waiting list.   [facility] [address], [place]  [phone]) [phone] [email or web address]   [facility] [address], [place], [place] [phone], Ext. [phone] Second and Fourth Thursday of each month, opens at 6:30 AM; Clinic ends at 9 AM.  Patients are seen on a first-come first-served basis, and a limited number are seen during each clinic.   [facility]  [address], [place], [place] [phone]) [phone]   Eligibility Requirements You must have lived in [place], [place], or [place] counties for at least the last three months.   You cannot be eligible for state or federal sponsored [place] Cityhealthcare insurance, including CIGNAVeterans Administration, [place]icaid, or [employer]icare.   You generally cannot be eligible for healthcare insurance through your employer.    How to apply: Eligibility screenings are held  every Tuesday and Wednesday afternoon from 1:00 pm until 4:00  pm. You do not need an appointment for the interview!  [facility] [address] [place], [place] [phone]   [facility] Health [facility]  [phone]   [facility] Health Department  [phone]   [facility] Health Department  [phone]    Behavioral Health Resources in the Community: Intensive Outpatient Programs Organization         Address  Phone  Notes  [facility] [address], [place], [place] [phone]   [facility] [address], [place], [place] [phone]   [facility] [address], [place], [place]  [phone]   [facility] [address]  [place], [place] [phone] or [phone]   Substance Abuse Resources Organization         Address  Phone  Notes  [facility] Drug Services  [phone]   [facility]  [phone]   [facility]  [phone]   Floydene Flock  [phone]   Residential & Outpatient Substance Abuse Program  [phone]   Psychological Services Organization         Address  Phone  Notes  [facility] Behavioral Health  [phone]   [facility]  [phone]   [facility] Mental Health [address], [place] [phone] or [phone]    Mobile Crisis Teams Organization         Address  Phone  Notes  [facility], Mobile Crisis Care Unit  [phone]   [facility]  [address] [place], [place] [phone]   Doristine Locks [address] [place] [phone]    Self-Help/Support Groups Organization         Address  Phone             Notes  [facility] - variety of support groups  [phone]- I7437963 Call for more information  Narcotics Anonymous (NA), [facility] [address], [employer] [place]  2 meetings at this location   Statistician          Address  Phone  Notes  [facility] [address],    [place]  [phone]   [facility]  [address] [place] [address], [place], [place] [phone]   [facility] [address] [place], [place] [phone] Admissions: 8am-3pm M-F  [facility] [address],    [place], [place] [phone]   [facility] [address] [place], [place], [place] [phone]   [facility] [address],  [place], [place] [phone]   [facility] - Intensive Outpatient [address]ure[address] Josephs [address], [place], [place] [phone]   [facility] ([facility] [address]  [place], [place] [phone] or [phone]   [facility] ([facility]) [address], [place], [place] [phone] Accepts Medicaid  [facility] [place] [address]  [place] [phone] Substance Abuse/Addiction Treatment   [facility] Organization         Address  Phone  Notes  [facility]  [phone]   Angie Fava, PhD [address] Ervin Knack [place], [place]   [phone] or [phone]   [facility] Behavioral   [address] [place], [place] [phone]   [facility] [address], [place], [place] [phone] Insurance/Medicaid/sponsorship through  [facility]  [facility] [address]                                    [place], [place] [phone] Therapy/tele-psych/case  [facility] [address]   [place], [place] [phone]    Dr. Lolly Mustache  [phone]   [facility]  [employer] [facility] Dept. 1) [address] [place] 2) [address] [place] 3)  [address], [place] [phone]  [phone]   [facility] Child Abuse Hotline [phone]) [phone] or [phone] (After Hours)

## 2015-06-08 NOTE — ED Notes (Signed)
PT comfortable with discharge and follow up instructions. Prescriptions x4. 

## 2015-06-08 NOTE — ED Provider Notes (Signed)
CSN: [id number]     Arrival date & time 06/08/15  0850 History   First MD Initiated Contact with Patient 06/08/15 0920     Chief Complaint  Patient presents with  . Chest Pain  . Fall     (Consider location/radiation/quality/duration/timing/severity/associated sxs/prior Treatment) HPI The patient states that she has been getting chest pain for a while. She states she has been ignoring it. She reports last night she awakened at about 3 AM and felt like she had a pressure on her chest. She reports she also felt nauseated and sweaty. She reports the chest pain has been constant. She does note that these symptoms have been coming and going for a number of months. She reports intermittently she will feel short of breath. She denies cough or fever. She reports sometimes she awakens in the morning gagging and trying to spit up. The patient reports she had a stress test in about 2005. She reports at that time no abnormality was identified. She reports she does smoke but has cut back to 3-4 cigarettes a day. She reports her family history is positive for MI in her mother at age 83. She reports she gets her blood pressure medications from a Dr. Mayford Knife on [address] but reports has not her primary care physician. She reports ever since she lost a PCP that she liked very well, [facility] and does not report her general symptoms to them or maintain a PCP relationship.  Second complaint is a fall this morning. Patient reports that she was heading to her car to come to the emergency department for chest pain when she slipped on the ice and fell onto her back and buttocks. She reports it is painful in her lower back but she does not have weakness numbness or tingling. She has been ambulatory without difficulty. Past Medical History  Diagnosis Date  . Hypertension   . Arthritis   . Chronic back pain   . Chest pain 2006  . Asthma   . Shortness  of breath     due to  smoking    Past Surgical History  Procedure Laterality Date  . Abdominal hysterectomy  1997  . Cardiac catheterization  2006  . Ingrown toenail removal    . Tonsillectomy    . Hemi-microdiscectomy lumbar laminectomy level 1  05/14/2012    Procedure: HEMI-MICRODISCECTOMY LUMBAR LAMINECTOMY LEVEL 1;  Surgeon: Jacki Cones, MD;  Location: [facility];  Service: Orthopedics;  Laterality: N/A;  Hemi Laminectomy Microdiscectomy L4 - L5 Central (X-Ray)  . Total hip arthroplasty Left 11/05/2013    Procedure: LEFT TOTAL HIP ARTHROPLASTY;  Surgeon: Jacki Cones, MD;  Location: [facility];  Service: Orthopedics;  Laterality: Left;   History reviewed. No pertinent family history. Social History  Substance Use Topics  . Smoking status: Current Every Day Smoker -- 1.00 packs/day for 30 years    Types: Cigarettes  . Smokeless tobacco: Never Used  . Alcohol Use: No   OB History    No data available     Review of Systems 10 Systems reviewed and are negative for acute change except as noted in the HPI.    Allergies  Review of patient's allergies indicates no known allergies.  Home Medications   Prior to Admission medications   Medication Sig Start Date End Date Taking? Authorizing Provider  albuterol (PROVENTIL HFA;VENTOLIN HFA) 108 (90 BASE) MCG/ACT inhaler Inhale 2 puffs into the lungs  every 4 (four) hours as needed for wheezing or shortness of breath. 09/25/14  Yes Eber HongBrian Miller, MD  enalapril-hydrochlorothiazide (VASERETIC) 10-25 MG per tablet Take 1 tablet by mouth daily before breakfast.    Yes Historical Provider, MD  Multiple Vitamin (MULTIVITAMIN WITH MINERALS) TABS tablet Take 1 tablet by mouth daily.   Yes Historical Provider, MD  zolpidem (AMBIEN) 10 MG tablet Take 10 mg by mouth at bedtime as needed for sleep.   Yes Historical Provider, MD  aspirin 81 MG chewable tablet Chew 1 tablet (81 mg total) by mouth daily. 06/08/15   Arby BarretteMarcy Geovanna Simko, MD   enalapril-hydrochlorothiazide (VASERETIC) 10-25 MG tablet Take 1 tablet by mouth daily. 06/08/15   Arby BarretteMarcy Tyaira Heward, MD  omeprazole (PRILOSEC) 20 MG capsule Take 1 capsule (20 mg total) by mouth daily. 06/08/15   Arby BarretteMarcy Carsyn Boster, MD  traMADol (ULTRAM) 50 MG tablet Take 2 tablets (100 mg total) by mouth every 6 (six) hours as needed. 06/08/15   Arby BarretteMarcy Malekai Markwood, MD   BP 133/88 mmHg  Pulse 58  Temp(Src) 98.1 F (36.7 C) (Oral)  Resp 21  Ht 5\' 1"  (1.549 m)  Wt 138 lb (62.596 kg)  BMI 26.09 kg/m2  SpO2 100% Physical Exam  Constitutional: She is oriented to person, place, and time. She appears well-developed and well-nourished.  HENT:  Head: Normocephalic and atraumatic.  Eyes: EOM are normal. Pupils are equal, round, and reactive to light.  Neck: Neck supple.  Cardiovascular: Normal rate, regular rhythm, normal heart sounds and intact distal pulses.   Pulmonary/Chest: Effort normal and breath sounds normal. She exhibits tenderness.  Patient winces and retracts with pressure to her sternocostal margins.  Abdominal: Soft. Bowel sounds are normal. She exhibits no distension. There is no tenderness.  Musculoskeletal: Normal range of motion. She exhibits no edema.  No lower extremity edema or calf tenderness. Patient has all intact muscular neurologic function. She is able to scoot herself down the stretcher to exit from the foot of the bed and ambulate to the bathroom without neurologic or motor dysfunction. Patient endorses some lower back pain with doing so but is not limited in activity.  Neurological: She is alert and oriented to person, place, and time. She has normal strength. Coordination normal. GCS eye subscore is 4. GCS verbal subscore is 5. GCS motor subscore is 6.  Skin: Skin is warm, dry and intact.  Psychiatric: She has a normal mood and affect.    ED Course  Procedures (including critical care time) Labs Review Labs Reviewed  BASIC METABOLIC PANEL - Abnormal; Notable for the  following:    Potassium 3.2 (*)    All other components within normal limits  CBC - Abnormal; Notable for the following:    Hemoglobin 11.8 (*)    RDW 15.6 (*)    All other components within normal limits  HEPATIC FUNCTION PANEL - Abnormal; Notable for the following:    Total Protein 6.4 (*)    ALT 10 (*)    All other components within normal limits  LIPASE, BLOOD  I-STAT TROPOININ, ED    Imaging Review Dg Chest 2 View  06/08/2015  CLINICAL DATA:  Chest pain and shortness of breath for 1 day EXAM: CHEST - 2 VIEW COMPARISON:  01/03/2015 FINDINGS: Cardiac shadow is within normal limits. The lungs are well aerated bilaterally. No focal infiltrate or sizable effusion is noted. No bony abnormality is noted. IMPRESSION: No acute abnormality seen. Electronically Signed   By: Alcide CleverMark  Lukens M.D.   On:  06/08/2015 09:58   I have personally reviewed and evaluated these images and lab results as part of my medical decision-making.   EKG Interpretation   Date/Time:  Wednesday June 08 2015 09:12:28 EST Ventricular Rate:  68 PR Interval:  160 QRS Duration: 80 QT Interval:  384 QTC Calculation: 408 R Axis:   79 Text Interpretation:  Sinus rhythm with Premature atrial complexes Septal  infarct , age undetermined Abnormal ECG similar previous Confirmed by  ZAVITZ  MD, JOSHUA ([id number]) on 06/08/2015 9:21:24 AM      MDM   Final diagnoses:  Chest pain, unspecified chest pain type  Fall, initial encounter   Patient has had ongoing chest pain for greater than several weeks. The history of present illness suggests potential multifactorial etiologies. Patient has symptoms of findings that suggest GERD and chest wall pain. She also has a risk factor profile including hypertension which is well controlled, light smoking and family history. At this time cardiac enzymes are normal and EKG does not show any acute changes. Patient has distant history of cardiac workup that was negative. At this time I do  feel she is safe to pursue outpatient follow-up with cardiology for further diagnostic tests if indicated. Patient reports she intends to do so in the resources are provided. She is counseled on smoking cessation. Patient is compliant with her medications. She will be provided with tramadol for pain related to a fall just prior to coming. She'll be instructed to take a daily aspirin until her follow-up. She will be also placed on Prilosec for the symptoms that are suggestive of GERD. Patient is counseled on signs and symptoms which return. She is discharged in stable condition. Patient understands follow-up plan and management.    Arby Barrette, MD 06/08/15 [phone]

## 2015-06-08 NOTE — ED Notes (Signed)
PT reports pain to back and buttocks due to fall this AM while getting in car.

## 2015-06-08 NOTE — ED Notes (Signed)
Pt reports CP woke her up at 0300 today and felt like some one was sitting on her chest. CP central

## 2015-11-26 ENCOUNTER — Encounter (HOSPITAL_COMMUNITY): Payer: Self-pay | Admitting: Emergency Medicine

## 2015-11-26 DIAGNOSIS — S30860A Insect bite (nonvenomous) of lower back and pelvis, initial encounter: Secondary | ICD-10-CM | POA: Diagnosis present

## 2015-11-26 DIAGNOSIS — F17210 Nicotine dependence, cigarettes, uncomplicated: Secondary | ICD-10-CM | POA: Insufficient documentation

## 2015-11-26 DIAGNOSIS — Z79899 Other long term (current) drug therapy: Secondary | ICD-10-CM | POA: Diagnosis not present

## 2015-11-26 DIAGNOSIS — S20461A Insect bite (nonvenomous) of right back wall of thorax, initial encounter: Secondary | ICD-10-CM | POA: Insufficient documentation

## 2015-11-26 DIAGNOSIS — Y939 Activity, unspecified: Secondary | ICD-10-CM | POA: Insufficient documentation

## 2015-11-26 DIAGNOSIS — J45909 Unspecified asthma, uncomplicated: Secondary | ICD-10-CM | POA: Insufficient documentation

## 2015-11-26 DIAGNOSIS — T7849XA Other allergy, initial encounter: Secondary | ICD-10-CM | POA: Diagnosis not present

## 2015-11-26 DIAGNOSIS — I10 Essential (primary) hypertension: Secondary | ICD-10-CM | POA: Insufficient documentation

## 2015-11-26 DIAGNOSIS — Y929 Unspecified place or not applicable: Secondary | ICD-10-CM

## 2015-11-26 DIAGNOSIS — Y999 Unspecified external cause status: Secondary | ICD-10-CM

## 2015-11-26 DIAGNOSIS — W57XXXA Bitten or stung by nonvenomous insect and other nonvenomous arthropods, initial encounter: Secondary | ICD-10-CM | POA: Insufficient documentation

## 2015-11-26 DIAGNOSIS — Z96642 Presence of left artificial hip joint: Secondary | ICD-10-CM

## 2015-11-26 DIAGNOSIS — Z5321 Procedure and treatment not carried out due to patient leaving prior to being seen by health care provider: Secondary | ICD-10-CM

## 2015-11-26 DIAGNOSIS — M1990 Unspecified osteoarthritis, unspecified site: Secondary | ICD-10-CM | POA: Diagnosis not present

## 2015-11-26 DIAGNOSIS — Z7982 Long term (current) use of aspirin: Secondary | ICD-10-CM | POA: Diagnosis not present

## 2015-11-26 NOTE — ED Notes (Signed)
Pt. reports insect bite to right back with pain/itching and swelling , denies fever or chills , no drainage , airway intact / respirations unlabored, no oral swelling.

## 2015-11-27 ENCOUNTER — Emergency Department (HOSPITAL_COMMUNITY)
Admission: EM | Admit: 2015-11-27 | Discharge: 2015-11-27 | Disposition: A | Discharge status: Home / Self Care | Payer: Managed Care, Other (non HMO) | Attending: Emergency Medicine | Admitting: Emergency Medicine

## 2015-11-27 ENCOUNTER — Encounter (HOSPITAL_COMMUNITY): Payer: Self-pay | Admitting: Emergency Medicine

## 2015-11-27 ENCOUNTER — Emergency Department (HOSPITAL_COMMUNITY)
Admission: EM | Admit: 2015-11-27 | Discharge: 2015-11-27 | Disposition: A | Discharge status: Home / Self Care | Payer: Managed Care, Other (non HMO) | Source: Home / Self Care

## 2015-11-27 DIAGNOSIS — Z79899 Other long term (current) drug therapy: Secondary | ICD-10-CM | POA: Insufficient documentation

## 2015-11-27 DIAGNOSIS — T63484A Toxic effect of venom of other arthropod, undetermined, initial encounter: Secondary | ICD-10-CM

## 2015-11-27 DIAGNOSIS — Y929 Unspecified place or not applicable: Secondary | ICD-10-CM | POA: Insufficient documentation

## 2015-11-27 DIAGNOSIS — Y939 Activity, unspecified: Secondary | ICD-10-CM | POA: Insufficient documentation

## 2015-11-27 DIAGNOSIS — I10 Essential (primary) hypertension: Secondary | ICD-10-CM | POA: Insufficient documentation

## 2015-11-27 DIAGNOSIS — M1990 Unspecified osteoarthritis, unspecified site: Secondary | ICD-10-CM | POA: Insufficient documentation

## 2015-11-27 DIAGNOSIS — J45909 Unspecified asthma, uncomplicated: Secondary | ICD-10-CM | POA: Insufficient documentation

## 2015-11-27 DIAGNOSIS — T7849XA Other allergy, initial encounter: Secondary | ICD-10-CM | POA: Insufficient documentation

## 2015-11-27 DIAGNOSIS — F17210 Nicotine dependence, cigarettes, uncomplicated: Secondary | ICD-10-CM | POA: Insufficient documentation

## 2015-11-27 DIAGNOSIS — Z7982 Long term (current) use of aspirin: Secondary | ICD-10-CM | POA: Insufficient documentation

## 2015-11-27 DIAGNOSIS — W57XXXA Bitten or stung by nonvenomous insect and other nonvenomous arthropods, initial encounter: Secondary | ICD-10-CM | POA: Insufficient documentation

## 2015-11-27 DIAGNOSIS — Y999 Unspecified external cause status: Secondary | ICD-10-CM | POA: Insufficient documentation

## 2015-11-27 MED ORDER — DIPHENHYDRAMINE HCL 25 MG PO CAPS
25.0000 mg | ORAL_CAPSULE | Freq: Once | ORAL | Status: AC
  Administered 2015-11-27: 25 mg via ORAL
  Filled 2015-11-27: qty 1

## 2015-11-27 MED ORDER — FAMOTIDINE 20 MG PO TABS
40.0000 mg | ORAL_TABLET | Freq: Once | ORAL | Status: AC
  Administered 2015-11-27: 40 mg via ORAL
  Filled 2015-11-27: qty 2

## 2015-11-27 MED ORDER — DIPHENHYDRAMINE HCL 25 MG PO TABS: 25.0000 mg | ORAL_TABLET | Freq: Four times a day (QID) | ORAL | Status: DC

## 2015-11-27 MED ORDER — PREDNISONE 20 MG PO TABS: 40.0000 mg | ORAL_TABLET | Freq: Every day | ORAL | Status: DC

## 2015-11-27 MED ORDER — PREDNISONE 20 MG PO TABS
60.0000 mg | ORAL_TABLET | Freq: Once | ORAL | Status: AC
  Administered 2015-11-27: 60 mg via ORAL
  Filled 2015-11-27: qty 3

## 2015-11-27 MED ORDER — FAMOTIDINE 20 MG PO TABS: 20.0000 mg | ORAL_TABLET | Freq: Two times a day (BID) | ORAL | Status: DC

## 2015-11-27 NOTE — ED Notes (Signed)
No response in waiting room.

## 2015-11-27 NOTE — ED Notes (Signed)
Patient states that Friday afternoon was when leaving the bank she was bit by something on her back.  She called EMS but wasn't transported for medical attention.  States that yesterday she was itching really bad and swollen around bite mark.  [facility] but was too busy so she left without being seen.  Patient states still itching and swollen on right lateral side of back.

## 2015-11-27 NOTE — ED Provider Notes (Signed)
CSN: [id number]     Arrival date & time 11/27/15  9629 History   First MD Initiated Contact with Patient 11/27/15 [phone]     Chief Complaint  Patient presents with  . Insect Bite   HPI Comments: 60 year old female presents with an insect sting. Past medical history significant for hypertension, arthritis, chronic back pain. Patient states that she got in her car yesterday and had the sensation of something biting her in the back. She pulled over on the side of the road and called EMS. EMS of evaluated her and told her that they saw the bite mark but no stinger. She went to [facility] however wait time was too long so she decided to come here today. She reports the site is itchy and painful. Denies fever, chest pain, SOB, abdominal pain, N/V. She has not tried any medicines to help it feel better.   Past Medical History  Diagnosis Date  . Hypertension   . Arthritis   . Chronic back pain   . Chest pain 2006  . Asthma   . Shortness of breath     due to  smoking    Past Surgical History  Procedure Laterality Date  . Abdominal hysterectomy  1997  . Cardiac catheterization  2006  . Ingrown toenail removal    . Tonsillectomy    . Hemi-microdiscectomy lumbar laminectomy level 1  05/14/2012    Procedure: HEMI-MICRODISCECTOMY LUMBAR LAMINECTOMY LEVEL 1;  Surgeon: Jacki Cones, MD;  Location: [facility];  Service: Orthopedics;  Laterality: N/A;  Hemi Laminectomy Microdiscectomy L4 - L5 Central (X-Ray)  . Total hip arthroplasty Left 11/05/2013    Procedure: LEFT TOTAL HIP ARTHROPLASTY;  Surgeon: Jacki Cones, MD;  Location: [facility];  Service: Orthopedics;  Laterality: Left;   No family history on file. Social History  Substance Use Topics  . Smoking status: Current Every Day Smoker -- 1.00 packs/day for 30 years    Types: Cigarettes  . Smokeless tobacco: Never Used  . Alcohol Use: No   OB History    No data available     Review of Systems  Constitutional: Negative for fever.  Respiratory:  Negative for shortness of breath.   Cardiovascular: Negative for chest pain.  Gastrointestinal: Negative for nausea, vomiting and abdominal pain.  Skin: Positive for rash and wound.  All other systems reviewed and are negative.     Allergies  Review of patient's allergies indicates no known allergies.  Home Medications   Prior to Admission medications   Medication Sig Start Date End Date Taking? Authorizing Provider  albuterol (PROVENTIL HFA;VENTOLIN HFA) 108 (90 BASE) MCG/ACT inhaler Inhale 2 puffs into the lungs every 4 (four) hours as needed for wheezing or shortness of breath. 09/25/14   Eber Hong, MD  aspirin 81 MG chewable tablet Chew 1 tablet (81 mg total) by mouth daily. 06/08/15   Arby Barrette, MD  diphenhydrAMINE (BENADRYL) 25 MG tablet Take 1 tablet (25 mg total) by mouth every 6 (six) hours. 11/27/15   Bethel Born, PA-C  enalapril-hydrochlorothiazide (VASERETIC) 10-25 MG per tablet Take 1 tablet by mouth daily before breakfast.     Historical Provider, MD  enalapril-hydrochlorothiazide (VASERETIC) 10-25 MG tablet Take 1 tablet by mouth daily. 06/08/15   Arby Barrette, MD  famotidine (PEPCID) 20 MG tablet Take 1 tablet (20 mg total) by mouth 2 (two) times daily. 11/27/15   Bethel Born, PA-C  Multiple Vitamin (MULTIVITAMIN WITH MINERALS) TABS tablet Take 1 tablet by mouth daily.  Historical Provider, MD  omeprazole (PRILOSEC) 20 MG capsule Take 1 capsule (20 mg total) by mouth daily. 06/08/15   Arby BarretteMarcy Pfeiffer, MD  predniSONE (DELTASONE) 20 MG tablet Take 2 tablets (40 mg total) by mouth daily. 11/27/15   Bethel BornKelly Marie Gekas, PA-C  traMADol (ULTRAM) 50 MG tablet Take 2 tablets (100 mg total) by mouth every 6 (six) hours as needed. 06/08/15   Arby BarretteMarcy Pfeiffer, MD  zolpidem (AMBIEN) 10 MG tablet Take 10 mg by mouth at bedtime as needed for sleep.    Historical Provider, MD   BP 131/75 mmHg  Pulse 57  Temp(Src) 97.9 F (36.6 C) (Oral)  Resp 18  Ht 5\' 1"  (1.549 m)  Wt  65.772 kg  BMI 27.41 kg/m2  SpO2 100%   Physical Exam  Constitutional: She is oriented to person, place, and time. She appears well-developed and well-nourished. No distress.  Patient is couponing when i walk in room  HENT:  Head: Normocephalic and atraumatic.  Clear speech  Eyes: Conjunctivae are normal. Pupils are equal, round, and reactive to light. Right eye exhibits no discharge. Left eye exhibits no discharge. No scleral icterus.  Neck: Normal range of motion.  Cardiovascular: Normal rate and regular rhythm.  Exam reveals no gallop and no friction rub.   No murmur heard. Pulmonary/Chest: Effort normal and breath sounds normal. No respiratory distress. She has no wheezes. She has no rales. She exhibits no tenderness.  Abdominal: She exhibits no distension.  Neurological: She is alert and oriented to person, place, and time.  Skin: Skin is warm and dry.  4cm circular indurated rash on the right mid back. No tenderness to palpation  Psychiatric: She has a normal mood and affect. Her behavior is normal.    ED Course  Procedures (including critical care time) Labs Review Labs Reviewed - No data to display  Imaging Review No results found. I have personally reviewed and evaluated these images and lab results as part of my medical decision-making.   EKG Interpretation None     Meds given in ED:  Medications  diphenhydrAMINE (BENADRYL) capsule 25 mg (25 mg Oral Given 11/27/15 0833)  predniSONE (DELTASONE) tablet 60 mg (60 mg Oral Given 11/27/15 0833)  famotidine (PEPCID) tablet 40 mg (40 mg Oral Given 11/27/15 0833)    Discharge Medication List as of 11/27/2015  8:16 AM    START taking these medications   Details  diphenhydrAMINE (BENADRYL) 25 MG tablet Take 1 tablet (25 mg total) by mouth every 6 (six) hours., Starting 11/27/2015, Until Discontinued, Print    famotidine (PEPCID) 20 MG tablet Take 1 tablet (20 mg total) by mouth 2 (two) times daily., Starting 11/27/2015, Until  Discontinued, Print    predniSONE (DELTASONE) 20 MG tablet Take 2 tablets (40 mg total) by mouth daily., Starting 11/27/2015, Until Discontinued, Print         MDM   Final diagnoses:  Insect sting allergy, current reaction, undetermined intent, initial encounter   60 year old female presents with an allergic reaction to what most likely was a sting. She is afebrile, not tachycardic, normal even respirations, normotensive, and not hypoxic. She has not tried any medicines. One does of benadryl, pepcid, and prednisone given here in ED. Rx for 5 days. Patient is NAD, non-toxic, with stable VS. Patient is informed of clinical course, understands medical decision making process, and agrees with plan. Opportunity for questions provided and all questions answered. Return precautions given.     Bethel BornKelly Marie Gekas, PA-C  11/27/15 0935  Melene Planan Floyd, DO 11/27/15 1056

## 2015-12-04 ENCOUNTER — Ambulatory Visit (HOSPITAL_COMMUNITY)
Admission: EM | Admit: 2015-12-04 | Discharge: 2015-12-04 | Disposition: A | Discharge status: Home / Self Care | Payer: Managed Care, Other (non HMO) | Attending: Family Medicine | Admitting: Family Medicine

## 2015-12-04 ENCOUNTER — Encounter (HOSPITAL_COMMUNITY): Payer: Self-pay | Admitting: *Deleted

## 2015-12-04 DIAGNOSIS — E876 Hypokalemia: Secondary | ICD-10-CM | POA: Diagnosis not present

## 2015-12-04 LAB — POCT I-STAT, CHEM 8
BUN: 14 mg/dL (ref 6–20)
CHLORIDE: 96 mmol/L — AB (ref 101–111)
Calcium, Ion: 1.16 mmol/L (ref 1.12–1.23)
Creatinine, Ser: 0.7 mg/dL (ref 0.44–1.00)
Glucose, Bld: 101 mg/dL — ABNORMAL HIGH (ref 65–99)
HCT: 39 % (ref 36.0–46.0)
HEMOGLOBIN: 13.3 g/dL (ref 12.0–15.0)
Potassium: 3.3 mmol/L — ABNORMAL LOW (ref 3.5–5.1)
Sodium: 140 mmol/L (ref 135–145)
TCO2: 33 mmol/L (ref 0–100)

## 2015-12-04 MED ORDER — POTASSIUM CHLORIDE CRYS ER 20 MEQ PO TBCR: 20.0000 meq | EXTENDED_RELEASE_TABLET | Freq: Two times a day (BID) | ORAL | Status: DC

## 2015-12-04 MED ORDER — DOXYCYCLINE HYCLATE 100 MG PO CAPS: 100.0000 mg | ORAL_CAPSULE | Freq: Two times a day (BID) | ORAL | Status: DC

## 2015-12-04 NOTE — ED Notes (Signed)
Pt  Reports  Symptoms  Of  Weakness   Nausea        With  A  Slight  Cough     X   1  Week  Pt  States  Was  Seen   Er  8  Days  Ago   For poss  Insect  Bite     pt  Drove  Herself  To the  ucc        Pt  States   The  Possible  Bite  Was  On her  Back  But      It   Is  Better     now

## 2015-12-04 NOTE — ED Provider Notes (Signed)
CSN: [id number]     Arrival date & time 12/04/15  1319 History   None    Chief Complaint  Patient presents with  . Weakness   (Consider location/radiation/quality/duration/timing/severity/associated sxs/prior Treatment) Patient is a 60 y.o. female presenting with weakness. The history is provided by the patient.  Weakness This is a new problem. The current episode started more than 1 week ago (sting to right back , seen and treated in ER, site has resolved to nl but pt feeling vague syst sx., no fever, no other rash). The problem has been gradually worsening. Pertinent negatives include no chest pain and no abdominal pain.    Past Medical History  Diagnosis Date  . Hypertension   . Arthritis   . Chronic back pain   . Chest pain 2006  . Asthma   . Shortness of breath     due to  smoking    Past Surgical History  Procedure Laterality Date  . Abdominal hysterectomy  1997  . Cardiac catheterization  2006  . Ingrown toenail removal    . Tonsillectomy    . Hemi-microdiscectomy lumbar laminectomy level 1  05/14/2012    Procedure: HEMI-MICRODISCECTOMY LUMBAR LAMINECTOMY LEVEL 1;  Surgeon: Jacki Conesonald A Gioffre, MD;  Location: [facility];  Service: Orthopedics;  Laterality: N/A;  Hemi Laminectomy Microdiscectomy L4 - L5 Central (X-Ray)  . Total hip arthroplasty Left 11/05/2013    Procedure: LEFT TOTAL HIP ARTHROPLASTY;  Surgeon: Jacki Conesonald A Gioffre, MD;  Location: [facility];  Service: Orthopedics;  Laterality: Left;   History reviewed. No pertinent family history. Social History  Substance Use Topics  . Smoking status: Current Every Day Smoker -- 1.00 packs/day for 30 years    Types: Cigarettes  . Smokeless tobacco: Never Used  . Alcohol Use: No   OB History    No data available     Review of Systems  HENT: Negative.   Respiratory: Negative.   Cardiovascular: Negative.  Negative for chest pain.  Gastrointestinal: Negative.  Negative for abdominal pain.  Musculoskeletal: Negative.   Skin:  Negative.   Neurological: Positive for weakness.  All other systems reviewed and are negative.   Allergies  Review of patient's allergies indicates no known allergies.  Home Medications   Prior to Admission medications   Medication Sig Start Date End Date Taking? Authorizing Provider  albuterol (PROVENTIL HFA;VENTOLIN HFA) 108 (90 BASE) MCG/ACT inhaler Inhale 2 puffs into the lungs every 4 (four) hours as needed for wheezing or shortness of breath. 09/25/14   Eber HongBrian Miller, MD  aspirin 81 MG chewable tablet Chew 1 tablet (81 mg total) by mouth daily. 06/08/15   Arby BarretteMarcy Pfeiffer, MD  diphenhydrAMINE (BENADRYL) 25 MG tablet Take 1 tablet (25 mg total) by mouth every 6 (six) hours. 11/27/15   Bethel BornKelly Marie Gekas, PA-C  doxycycline (VIBRAMYCIN) 100 MG capsule Take 1 capsule (100 mg total) by mouth 2 (two) times daily. 12/04/15   Linna HoffJames D Kashawna Manzer, MD  enalapril-hydrochlorothiazide (VASERETIC) 10-25 MG per tablet Take 1 tablet by mouth daily before breakfast.     Historical Provider, MD  enalapril-hydrochlorothiazide (VASERETIC) 10-25 MG tablet Take 1 tablet by mouth daily. 06/08/15   Arby BarretteMarcy Pfeiffer, MD  famotidine (PEPCID) 20 MG tablet Take 1 tablet (20 mg total) by mouth 2 (two) times daily. 11/27/15   Bethel BornKelly Marie Gekas, PA-C  Multiple Vitamin (MULTIVITAMIN WITH MINERALS) TABS tablet Take 1 tablet by mouth daily.    Historical Provider, MD  omeprazole (PRILOSEC) 20 MG capsule Take 1 capsule (  20 mg total) by mouth daily. 06/08/15   Arby Barrette, MD  potassium chloride SA (K-DUR,KLOR-CON) 20 MEQ tablet Take 1 tablet (20 mEq total) by mouth 2 (two) times daily. For 10days then take qd. 12/04/15   Linna Hoff, MD  predniSONE (DELTASONE) 20 MG tablet Take 2 tablets (40 mg total) by mouth daily. 11/27/15   Bethel Born, PA-C  traMADol (ULTRAM) 50 MG tablet Take 2 tablets (100 mg total) by mouth every 6 (six) hours as needed. 06/08/15   Arby Barrette, MD  zolpidem (AMBIEN) 10 MG tablet Take 10 mg by mouth at bedtime  as needed for sleep.    Historical Provider, MD   Meds Ordered and Administered this Visit  Medications - No data to display  BP 140/101 mmHg  Pulse 58  Temp(Src) 98.5 F (36.9 C) (Oral)  Resp 18  SpO2 97% No data found.   Physical Exam  Constitutional: She is oriented to person, place, and time. She appears well-developed and well-nourished.  HENT:  Mouth/Throat: Oropharynx is clear and moist.  Eyes: Conjunctivae are normal. Pupils are equal, round, and reactive to light.  Neck: Normal range of motion. Neck supple.  Cardiovascular: Normal rate, normal heart sounds and intact distal pulses.   Pulmonary/Chest: Effort normal and breath sounds normal.  Abdominal: Soft. Bowel sounds are normal. There is no tenderness.  Lymphadenopathy:    She has no cervical adenopathy.  Neurological: She is alert and oriented to person, place, and time.  Skin: Skin is warm and dry.  Nursing note and vitals reviewed.   ED Course  Procedures (including critical care time)  Labs Review Labs Reviewed  POCT I-STAT, CHEM 8 - Abnormal; Notable for the following:    Potassium 3.3 (*)    Chloride 96 (*)    Glucose, Bld 101 (*)    All other components within normal limits   k-3.3  Imaging Review No results found.   Visual Acuity Review  Right Eye Distance:   Left Eye Distance:   Bilateral Distance:    Right Eye Near:   Left Eye Near:    Bilateral Near:         MDM   1. Chronic hypokalemia        Linna Hoff, MD 12/04/15 1420

## 2016-01-07 ENCOUNTER — Emergency Department (HOSPITAL_COMMUNITY): Payer: Managed Care, Other (non HMO)

## 2016-01-07 ENCOUNTER — Encounter (HOSPITAL_COMMUNITY): Payer: Self-pay

## 2016-01-07 ENCOUNTER — Observation Stay (HOSPITAL_COMMUNITY)
Admission: EM | Admit: 2016-01-07 | Discharge: 2016-01-08 | Disposition: A | Discharge status: Home / Self Care | Payer: Managed Care, Other (non HMO) | Attending: Internal Medicine | Admitting: Internal Medicine

## 2016-01-07 DIAGNOSIS — E876 Hypokalemia: Secondary | ICD-10-CM

## 2016-01-07 DIAGNOSIS — F17200 Nicotine dependence, unspecified, uncomplicated: Secondary | ICD-10-CM

## 2016-01-07 DIAGNOSIS — I200 Unstable angina: Secondary | ICD-10-CM | POA: Diagnosis present

## 2016-01-07 DIAGNOSIS — G8929 Other chronic pain: Secondary | ICD-10-CM | POA: Insufficient documentation

## 2016-01-07 DIAGNOSIS — I10 Essential (primary) hypertension: Secondary | ICD-10-CM | POA: Diagnosis not present

## 2016-01-07 DIAGNOSIS — R079 Chest pain, unspecified: Secondary | ICD-10-CM | POA: Diagnosis present

## 2016-01-07 DIAGNOSIS — Z79899 Other long term (current) drug therapy: Secondary | ICD-10-CM

## 2016-01-07 DIAGNOSIS — Z96642 Presence of left artificial hip joint: Secondary | ICD-10-CM | POA: Insufficient documentation

## 2016-01-07 DIAGNOSIS — Z96652 Presence of left artificial knee joint: Secondary | ICD-10-CM | POA: Diagnosis not present

## 2016-01-07 DIAGNOSIS — R0789 Other chest pain: Principal | ICD-10-CM | POA: Insufficient documentation

## 2016-01-07 DIAGNOSIS — T502X5A Adverse effect of carbonic-anhydrase inhibitors, benzothiadiazides and other diuretics, initial encounter: Secondary | ICD-10-CM | POA: Diagnosis not present

## 2016-01-07 DIAGNOSIS — F17210 Nicotine dependence, cigarettes, uncomplicated: Secondary | ICD-10-CM | POA: Diagnosis not present

## 2016-01-07 DIAGNOSIS — J45909 Unspecified asthma, uncomplicated: Secondary | ICD-10-CM | POA: Insufficient documentation

## 2016-01-07 DIAGNOSIS — Z7982 Long term (current) use of aspirin: Secondary | ICD-10-CM | POA: Diagnosis not present

## 2016-01-07 DIAGNOSIS — Z720 Tobacco use: Secondary | ICD-10-CM

## 2016-01-07 DIAGNOSIS — K219 Gastro-esophageal reflux disease without esophagitis: Secondary | ICD-10-CM | POA: Diagnosis not present

## 2016-01-07 DIAGNOSIS — Z8249 Family history of ischemic heart disease and other diseases of the circulatory system: Secondary | ICD-10-CM | POA: Insufficient documentation

## 2016-01-07 LAB — TROPONIN I
Troponin I: <0.03 ng/mL (ref <0.03)
Troponin I: <0.03 ng/mL (ref <0.03)
Troponin I: <0.03 ng/mL (ref <0.03)

## 2016-01-07 LAB — BASIC METABOLIC PANEL
Anion gap: 10 (ref 5–15)
BUN: 8 mg/dL (ref 6–20)
CHLORIDE: 98 mmol/L — AB (ref 101–111)
CO2: 30 mmol/L (ref 22–32)
CREATININE: 0.83 mg/dL (ref 0.44–1.00)
Calcium: 9.9 mg/dL (ref 8.9–10.3)
Glucose, Bld: 130 mg/dL — ABNORMAL HIGH (ref 65–99)
Potassium: 2.8 mmol/L — ABNORMAL LOW (ref 3.5–5.1)
SODIUM: 138 mmol/L (ref 135–145)

## 2016-01-07 LAB — CBC
HCT: 45.3 % (ref 36.0–46.0)
Hemoglobin: 14.6 g/dL (ref 12.0–15.0)
MCH: 29.7 pg (ref 26.0–34.0)
MCHC: 32.2 g/dL (ref 30.0–36.0)
MCV: 92.3 fL (ref 78.0–100.0)
PLATELETS: 354 10*3/uL (ref 150–400)
RBC: 4.91 MIL/uL (ref 3.87–5.11)
RDW: 14 % (ref 11.5–15.5)
WBC: 7.3 10*3/uL (ref 4.0–10.5)

## 2016-01-07 LAB — I-STAT TROPONIN, ED: TROPONIN I, POC: 0 ng/mL (ref 0.00–0.08)

## 2016-01-07 LAB — MAGNESIUM: Magnesium: 2 mg/dL (ref 1.7–2.4)

## 2016-01-07 MED ORDER — POTASSIUM CHLORIDE CRYS ER 20 MEQ PO TBCR
40.0000 meq | EXTENDED_RELEASE_TABLET | Freq: Once | ORAL | Status: AC
  Administered 2016-01-07: 40 meq via ORAL
  Filled 2016-01-07: qty 2

## 2016-01-07 MED ORDER — HEPARIN (PORCINE) IN NACL 100-0.45 UNIT/ML-% IJ SOLN
700.0000 [IU]/h | INTRAMUSCULAR | Status: DC
  Administered 2016-01-07: 700 [IU]/h via INTRAVENOUS
  Filled 2016-01-07: qty 250

## 2016-01-07 MED ORDER — HEPARIN BOLUS VIA INFUSION
3500.0000 [IU] | Freq: Once | INTRAVENOUS | Status: AC
  Administered 2016-01-07: 3500 [IU] via INTRAVENOUS
  Filled 2016-01-07: qty 3500

## 2016-01-07 MED ORDER — MORPHINE SULFATE (PF) 2 MG/ML IV SOLN
2.0000 mg | INTRAVENOUS | Status: DC | PRN
  Administered 2016-01-07 – 2016-01-08 (×8): 2 mg via INTRAVENOUS
  Filled 2016-01-07 (×8): qty 1

## 2016-01-07 MED ORDER — ONDANSETRON HCL 4 MG/2ML IJ SOLN
4.0000 mg | Freq: Once | INTRAMUSCULAR | Status: AC
  Administered 2016-01-07: 4 mg via INTRAVENOUS
  Filled 2016-01-07: qty 2

## 2016-01-07 MED ORDER — ACETAMINOPHEN 325 MG PO TABS
650.0000 mg | ORAL_TABLET | ORAL | Status: DC | PRN
Start: 2016-01-07 — End: 2016-01-08

## 2016-01-07 MED ORDER — ASPIRIN EC 325 MG PO TBEC
325.0000 mg | DELAYED_RELEASE_TABLET | Freq: Once | ORAL | Status: AC
  Administered 2016-01-07: 325 mg via ORAL
  Filled 2016-01-07: qty 1

## 2016-01-07 MED ORDER — ONDANSETRON HCL 4 MG/2ML IJ SOLN: 4.0000 mg | Freq: Four times a day (QID) | INTRAMUSCULAR | Status: DC | PRN

## 2016-01-07 MED ORDER — ENOXAPARIN SODIUM 40 MG/0.4ML ~~LOC~~ SOLN
40.0000 mg | SUBCUTANEOUS | Status: DC
  Administered 2016-01-07: 40 mg via SUBCUTANEOUS
  Filled 2016-01-07: qty 0.4

## 2016-01-07 MED ORDER — ZOLPIDEM TARTRATE 5 MG PO TABS
5.0000 mg | ORAL_TABLET | Freq: Once | ORAL | Status: AC
  Administered 2016-01-07: 5 mg via ORAL
  Filled 2016-01-07: qty 1

## 2016-01-07 MED ORDER — MORPHINE SULFATE (PF) 4 MG/ML IV SOLN
4.0000 mg | Freq: Once | INTRAVENOUS | Status: AC
  Administered 2016-01-07: 4 mg via INTRAVENOUS
  Filled 2016-01-07: qty 1

## 2016-01-07 MED ORDER — NITROGLYCERIN 0.4 MG SL SUBL
0.4000 mg | SUBLINGUAL_TABLET | SUBLINGUAL | Status: DC | PRN
  Administered 2016-01-07: 0.4 mg via SUBLINGUAL
  Filled 2016-01-07: qty 1

## 2016-01-07 NOTE — ED Provider Notes (Signed)
[facility]-EMERGENCY DEPT Provider Note   CSN: [id number] Arrival date & time: 01/07/16  4098  First Provider Contact:  None       History   Chief Complaint Chief Complaint  Patient presents with  . Chest Pain    HPI [name] is a 60 y.o. female.  Pt presents today with CP that woke her up from sleep around 0400.  She said that it feels like something is sitting on her chest.  She has never had anything like this in the past.   She said that she had a cath years ago, but no cardiac studies recently.      Past Medical History:  Diagnosis Date  . Arthritis   . Asthma   . Chest pain 2006  . Chronic back pain   . Hypertension   . Shortness of breath    due to  smoking     Patient Active Problem List   Diagnosis Date Noted  . Chest pain 01/07/2016  . Hypokalemia 11/06/2013  . Acute blood loss anemia 11/06/2013  . Osteoarthritis of left hip 11/05/2013  . 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 ABDOMINAL HYSTERECTOMY  1997  . CARDIAC CATHETERIZATION  2006  . HEMI-MICRODISCECTOMY LUMBAR LAMINECTOMY LEVEL 1  05/14/2012   Procedure: HEMI-MICRODISCECTOMY LUMBAR LAMINECTOMY LEVEL 1;  Surgeon: Jacki Cones, MD;  Location: [facility];  Service: Orthopedics;  Laterality: N/A;  Hemi Laminectomy Microdiscectomy L4 - L5 Central (X-Ray)  . ingrown toenail removal    . TONSILLECTOMY    . TOTAL HIP ARTHROPLASTY Left 11/05/2013   Procedure: LEFT TOTAL HIP ARTHROPLASTY;  Surgeon: Jacki Cones, MD;  Location: [facility];  Service: Orthopedics;  Laterality: Left;    OB History    No data available       Home Medications    Prior to Admission medications   Medication Sig Start Date End Date Taking? Authorizing Provider  albuterol (PROVENTIL HFA;VENTOLIN HFA) 108 (90 BASE) MCG/ACT inhaler Inhale 2 puffs into the lungs every 4 (four)  hours as needed for wheezing or shortness of breath. 09/25/14  Yes Eber Hong, MD  diphenhydrAMINE (BENADRYL) 25 MG tablet Take 1 tablet (25 mg total) by mouth every 6 (six) hours. 11/27/15  Yes Bethel Born, PA-C  enalapril-hydrochlorothiazide (VASERETIC) 10-25 MG tablet Take 1 tablet by mouth daily. 06/08/15  Yes Arby Barrette, MD  famotidine (PEPCID) 20 MG tablet Take 1 tablet (20 mg total) by mouth 2 (two) times daily. 11/27/15  Yes Bethel Born, PA-C  Multiple Vitamin (MULTIVITAMIN WITH MINERALS) TABS tablet Take 1 tablet by mouth daily.   Yes Historical Provider, MD  nicotine (NICODERM CQ - DOSED IN MG/24 HOURS) 14 mg/24hr patch Place 14 mg onto the skin daily.   Yes Historical Provider, MD  omeprazole (PRILOSEC) 20 MG capsule Take 1 capsule (20 mg total) by mouth daily. 06/08/15  Yes Arby Barrette, MD  oxyCODONE-acetaminophen (PERCOCET) 10-325 MG tablet Take 1 tablet by mouth every 4 (four) hours as needed for pain.   Yes Historical Provider, MD  potassium chloride SA (K-DUR,KLOR-CON) 20 MEQ tablet Take 1 tablet (20 mEq total) by mouth 2 (two) times daily. For 10days then take qd. 12/04/15  Yes Linna Hoff, MD  zolpidem (AMBIEN) 10 MG tablet Take 10 mg by mouth at bedtime as needed for sleep.   Yes Historical  Provider, MD  aspirin 81 MG chewable tablet Chew 1 tablet (81 mg total) by mouth daily. 06/08/15   Arby BarretteMarcy Pfeiffer, MD  enalapril-hydrochlorothiazide (VASERETIC) 10-25 MG per tablet Take 1 tablet by mouth daily before breakfast.     Historical Provider, MD    Family History No family history on file.  Social History Social History  Substance Use Topics  . Smoking status: Current Every Day Smoker    Packs/day: 1.00    Years: 30.00    Types: Cigarettes  . Smokeless tobacco: Never Used  . Alcohol use No     Allergies   Review of patient's allergies indicates no known allergies.   Review of Systems Review of Systems  Cardiovascular: Positive for chest pain.  All other  systems reviewed and are negative.    Physical Exam Updated Vital Signs BP 109/72   Pulse 60   Temp 98.4 F (36.9 C) (Oral)   Resp 14   Ht 5\' 1"  (1.549 m)   Wt 140 lb (63.5 kg)   SpO2 99%   BMI 26.45 kg/m   Physical Exam  Constitutional: She is oriented to person, place, and time. She appears well-developed and well-nourished.  HENT:  Head: Normocephalic and atraumatic.  Right Ear: External ear normal.  Left Ear: External ear normal.  Nose: Nose normal.  Mouth/Throat: Oropharynx is clear and moist.  Eyes: Conjunctivae and EOM are normal. Pupils are equal, round, and reactive to light.  Neck: Normal range of motion. Neck supple.  Cardiovascular: Normal rate, regular rhythm, normal heart sounds and intact distal pulses.   Pulmonary/Chest: Effort normal and breath sounds normal.  Abdominal: Soft. Bowel sounds are normal.  Musculoskeletal: Normal range of motion.  Neurological: She is alert and oriented to person, place, and time. She has normal reflexes.  Skin: Skin is warm and dry.  Psychiatric: She has a normal mood and affect. Her behavior is normal. Judgment and thought content normal.  Nursing note and vitals reviewed.    ED Treatments / Results  Labs (all labs ordered are listed, but only abnormal results are displayed) Labs Reviewed  BASIC METABOLIC PANEL - Abnormal; Notable for the following:       Result Value   Potassium 2.8 (*)    Chloride 98 (*)    Glucose, Bld 130 (*)    All other components within normal limits  CBC  MAGNESIUM  TROPONIN I  TROPONIN I  TROPONIN I  I-STAT TROPOININ, ED    EKG  EKG Interpretation  Date/Time:  Saturday January 07 2016 07:51:11 EDT Ventricular Rate:  86 PR Interval:  142 QRS Duration: 76 QT Interval:  340 QTC Calculation: 406 R Axis:   46 Text Interpretation:  Normal sinus rhythm Nonspecific T wave abnormality Abnormal ECG new t wave inversions inferior and lateral leads Confirmed by Brock Mokry MD, Wladyslaw Henrichs ([id number]) on  01/07/2016 7:54:24 AM       Radiology Dg Chest 2 View  Result Date: 01/07/2016 CLINICAL DATA:  Chest pain, pressure that woke the patient up this morning. She stated that she's been having the pain for a few days, but worse today. Pain shooting down both legs. Shortness of breath. Hx asthma, HTN EXAM: CHEST  2 VIEW COMPARISON:  06/08/2015 FINDINGS: Apical lordotic projection.  Thoracic spondylosis.  Emphysema noted. Mild thoracic spondylosis. No pleural effusion. The lungs appear otherwise clear. IMPRESSION: 1. Emphysema. 2. Thoracic spondylosis. Electronically Signed   By: Gaylyn RongWalter  Liebkemann M.D.   On: 01/07/2016 08:31    Procedures  Procedures (including critical care time)  Medications Ordered in ED Medications  nitroGLYCERIN (NITROSTAT) SL tablet 0.4 mg (0.4 mg Sublingual Given 01/07/16 0844)  aspirin EC tablet 325 mg (325 mg Oral Given 01/07/16 0843)  morphine 4 MG/ML injection 4 mg (4 mg Intravenous Given 01/07/16 0852)  ondansetron (ZOFRAN) injection 4 mg (4 mg Intravenous Given 01/07/16 0852)  potassium chloride SA (K-DUR,KLOR-CON) CR tablet 40 mEq (40 mEq Oral Given 01/07/16 0906)     Initial Impression / Assessment and Plan / ED Course  I have reviewed the triage vital signs and the nursing notes.  Pertinent labs & imaging results that were available during my care of the patient were reviewed by me and considered in my medical decision making (see chart for details).  Clinical Course    Pt's cp is resolved.  She has a heart score of 5.  She has not had any recent cardiac eval, so I will speak with the medical doctors for admission.  Pt d/w internal medicine teaching service for admission.  She will be admitted for observation.  Final Clinical Impressions(s) / ED Diagnoses   Final diagnoses:  Chest pain, unspecified chest pain type  Tobacco abuse  Hypokalemia    New Prescriptions New Prescriptions   No medications on file     [name], MD 01/07/16 [phone]

## 2016-01-07 NOTE — Progress Notes (Signed)
   Subjective: Currently, the patient is comfortable. No acute complaints. Eating lunch, good appetite.  Interval Events: Exercise stress test this AM.  Objective: Vital signs in last 24 hours: Vitals:   01/07/16 0915 01/07/16 1015 01/07/16 1045 01/07/16 1124  BP: 109/72 121/82 121/80 110/71  Pulse: 60 62 (!) 54 (!) 57  Resp: 14 16 15 20   Temp:    98.2 F (36.8 C)  TempSrc:    Oral  SpO2: 99% 100% 100% 100%  Weight:      Height:       Physical Exam: Physical Exam  Constitutional: She appears well-developed. She is cooperative. No distress.  Cardiovascular: Normal rate, regular rhythm, normal heart sounds and normal pulses.  Exam reveals no gallop.   No murmur heard. Pulmonary/Chest: Effort normal and breath sounds normal. No respiratory distress. Breasts are symmetrical.  Abdominal: Soft. Bowel sounds are normal. There is no tenderness.  Musculoskeletal: She exhibits no edema or tenderness.  Skin: Skin is warm and dry.   Labs: CBC:  Recent Labs Lab 01/07/16 0803  WBC 7.3  HGB 14.6  HCT 45.3  MCV 92.3  PLT 354   Metabolic Panel:  Recent Labs Lab 01/07/16 0803 01/07/16 1005  NA 138  --   K 2.8*  --   CL 98*  --   CO2 30  --   GLUCOSE 130*  --   BUN 8  --   CREATININE 0.83  --   CALCIUM 9.9  --   MG  --  2.0   Cardiac Labs:  Recent Labs Lab 01/07/16 0813 01/07/16 1005 01/07/16 1458  TROPIPOC 0.00  --   --   TROPONINI  --  <0.03 <0.03     Medications:   Scheduled Medications: . enoxaparin (LOVENOX) injection  40 mg Subcutaneous Q24H   PRN Medications: acetaminophen, morphine injection, nitroGLYCERIN, ondansetron (ZOFRAN) IV  Assessment/Plan: Pt is a 60 y.o. yo female with a PMHx of HTN, hypokalemia who was admitted on 01/07/2016 with symptoms of CP.  1) CP: Typical CP for ACS, suspect unstable angina. Initial EKG w/o evidence of acute ischemia, ? T waves inversions in lat leads. Trops neg x3. Normal cath 2006 w/o CAD. Risk factors are HTN,  smoker, sig fam hx. Stress test today. Echo pending. - nitro PRN CP - continue ASA 81mg  - consider Lipids, A1c, TSH for ASCVD risk - Cards c/s, appreciate recs  - stress results pending  - echo pending  2) Hypokalemia: K chronically low over past year. Now 2.8. Will consider d/c HCTZ. - hold HCTZ - KDur PRN K<3.5  3) HTN: controlled. BP low normal ~100/70. Home meds: enalapril-HCTZ 10-25mg . Would consider d/c HCTZ 2/2 low potasium and good control of BP. - restart enalipril 10mg  qD - hold HCTZ  4) Chronic pain: low back controlled with percocet 10-325 q4h. - continue home meds  FEN/GI: no IVF, Full diet  DVT PPx - lovenox  Length of Stay: 0 day(s) Dispo: Anticipated discharge today/tomorrow.  Carolynn CommentBryan Sumi Lye, MD Pager: [phone] (7AM-5PM) 01/07/2016, 7:26 PM

## 2016-01-07 NOTE — Progress Notes (Signed)
ANTICOAGULATION CONSULT NOTE - Initial Consult  Pharmacy Consult for Lovenox Indication:  VTE prophylaxis   No Known Allergies  Patient Measurements: Height: 5\' 1"  (154.9 cm) Weight: 140 lb (63.5 kg) IBW/kg (Calculated) : 47.8 Heparin Dosing Weight: 60.9 kg  Vital Signs: Temp: 98.2 F (36.8 C) (08/12 1124) Temp Source: Oral (08/12 1124) BP: 110/71 (08/12 1124) Pulse Rate: 57 (08/12 1124)  Labs:  Recent Labs  01/07/16 0803 01/07/16 1005 01/07/16 1458  HGB 14.6  --   --   HCT 45.3  --   --   PLT 354  --   --   CREATININE 0.83  --   --   TROPONINI  --  <0.03 <0.03    Estimated Creatinine Clearance: 60 mL/min (by C-G formula based on SCr of 0.83 mg/dL).   Medical History: Past Medical History:  Diagnosis Date  . Arthritis   . Asthma   . Chest pain 2006  . Chronic back pain   . Hypertension   . Shortness of breath    due to  smoking    Assessment: 60 year old female with PMH significant for HTN, smoking and family history admitted with chest pain.   Patient was not on anticoagulation prior to admission. Patient was previously on heparin drip for ACS and has been switched to lovenox for dvt prophylaxis. Cardiology has planned a nuclear stress test for tomorrow.   Lovenox was given before heparin was stopped- Spoke to nursing staff twice about stopping heparin    Plan:  Lovenox 40 mg Q 24H Monitor cbc, s/sx of bleeding    Bailey MechEmily Stewart, PharmD Pharmacy Resident [phone] 01/07/2016,5:39 PM

## 2016-01-07 NOTE — ED Triage Notes (Signed)
Patient complains of chest pain since 0400 that awoke her from sleep. States that the pain radiates to left arm and legs, states that it feels as if something standing on chest

## 2016-01-07 NOTE — Progress Notes (Signed)
ANTICOAGULATION CONSULT NOTE - Initial Consult  Pharmacy Consult for Heparin Indication: chest pain/ACS  No Known Allergies  Patient Measurements: Height: 5\' 1"  (154.9 cm) Weight: 140 lb (63.5 kg) IBW/kg (Calculated) : 47.8 Heparin Dosing Weight: 60.9 kg  Vital Signs: Temp: 98.2 F (36.8 C) (08/12 1124) Temp Source: Oral (08/12 1124) BP: 110/71 (08/12 1124) Pulse Rate: 57 (08/12 1124)  Labs:  Recent Labs  01/07/16 0803 01/07/16 1005  HGB 14.6  --   HCT 45.3  --   PLT 354  --   CREATININE 0.83  --   TROPONINI  --  <0.03    Estimated Creatinine Clearance: 60 mL/min (by C-G formula based on SCr of 0.83 mg/dL).   Medical History: Past Medical History:  Diagnosis Date  . Arthritis   . Asthma   . Chest pain 2006  . Chronic back pain   . Hypertension   . Shortness of breath    due to  smoking    Assessment: 60 year old female with PMH significant for HTN, smoking and family history admitted with ACS to start IV heparin per pharmacy dosing. Patient was not on anticoagulation prior to admission. CBC and SCr are within normal limits. Had a clean cath in 2006.   Goal of Therapy:  Heparin level 0.3-0.7 units/ml Monitor platelets by anticoagulation protocol: Yes   Plan:  Heparin bolus of 3500 units x1 then 700 units/hr.  Heparin level in 6 hours.  Daily heparin level and CBC while on therapy.   Link SnufferJessica Birdell Frasier, PharmD, BCPS Clinical Pharmacist [phone] 01/07/2016,11:31 AM

## 2016-01-07 NOTE — ED Notes (Signed)
Patient transported to X-ray 

## 2016-01-07 NOTE — H&P (Signed)
Date: 01/07/2016               Patient Name:  [name] MRN: [id number]  DOB: 1956/05/16 Age / Sex: 60 y.o., female   PCP: Pcp Not In System         Medical Service: Internal Medicine Teaching Service         Attending Physician: Dr. Burns SpainElizabeth A Butcher, MD    First Contact: Dr. Carolynn CommentBryan Kirill Chatterjee Pager: [phone]  Second Contact: Dr. Heywood Ilesushil Patel Pager: [phone]       After Hours (After 5p/  First Contact Pager: [phone]  weekends / holidays): Second Contact Pager: [phone]   Chief Complaint: CP  History of Present Illness: Ms. [name] is a 60 y.o. female with a h/o of smoking, HTN, hypokalemia who presents with CP which began this morning at 0400 and woke her from sleep. She describes the pain as pressure like "some one sitting on my chest." She notes that the sensation radiates down the left arm with a tingling feeling. She has had episodic symptoms of the same for 3-4 months. They occur at rest and with activity and typically last several hours. She was concerned that this may be gas-related and has been treating these symptoms with reflux medications such as Tums w/o relief. This morning she became concerned about having a heart attack and presented to the ED.  She has also had some SOB which sometimes occurs with these episodes and sometimes in isolation. She takes an albuterol inhaler which brings mild relief. She takes her BP medication regularly at home but does not check her BPs. She endorses occasional HAs and dizziness, denies fevers/chills, fatigue, N/V, constipation/diarrhea, endorses mild LLQ abd pain and episodic sweats.  In the ED she received SL nitro who relief of symptoms. She also received IV morphine and zofran which did bring some relief. On interview, however, she notes that the CP was returning.  She has an extensive family h/o CAD and MI with mother and sister dying from MI at 5557 and 2765 respectively. She denies any h/o DM, but notes that this runs in her  family. She had a cardiac catheterization which demonstrated no CAD and normal EF in 2006. Her PCP is a Dr. Mayford KnifeWilliams whose practice is on [address].  Meds: Current Facility-Administered Medications  Medication Dose Route Frequency Provider Last Rate Last Dose  . nitroGLYCERIN (NITROSTAT) SL tablet 0.4 mg  0.4 mg Sublingual Q5 min PRN Jacalyn LefevreJulie Haviland, MD   0.4 mg at 01/07/16 [id number]   Current Outpatient Prescriptions  Medication Sig Dispense Refill  . albuterol (PROVENTIL HFA;VENTOLIN HFA) 108 (90 BASE) MCG/ACT inhaler Inhale 2 puffs into the lungs every 4 (four) hours as needed for wheezing or shortness of breath. 1 Inhaler 3  . diphenhydrAMINE (BENADRYL) 25 MG tablet Take 1 tablet (25 mg total) by mouth every 6 (six) hours. 20 tablet 0  . enalapril-hydrochlorothiazide (VASERETIC) 10-25 MG tablet Take 1 tablet by mouth daily. 30 tablet 0  . famotidine (PEPCID) 20 MG tablet Take 1 tablet (20 mg total) by mouth 2 (two) times daily. 10 tablet 0  . Multiple Vitamin (MULTIVITAMIN WITH MINERALS) TABS tablet Take 1 tablet by mouth daily.    . nicotine (NICODERM CQ - DOSED IN MG/24 HOURS) 14 mg/24hr patch Place 14 mg onto the skin daily.    Marland Kitchen. omeprazole (PRILOSEC) 20 MG capsule Take 1 capsule (20 mg total) by mouth daily. 30 capsule 0  . oxyCODONE-acetaminophen (PERCOCET) 10-325  MG tablet Take 1 tablet by mouth every 4 (four) hours as needed for pain.    . potassium chloride SA (K-DUR,KLOR-CON) 20 MEQ tablet Take 1 tablet (20 mEq total) by mouth 2 (two) times daily. For 10days then take qd. 30 tablet 1  . zolpidem (AMBIEN) 10 MG tablet Take 10 mg by mouth at bedtime as needed for sleep.    Marland Kitchen aspirin 81 MG chewable tablet Chew 1 tablet (81 mg total) by mouth daily. 30 tablet 0   Allergies: Allergies as of 01/07/2016  . (No Known Allergies)   Past Medical History:  Diagnosis Date  . Arthritis   . Asthma   . Chest pain 2006  . Chronic back pain   . Hypertension   . Shortness of breath    due to   smoking    Family History: CAD: mother died MI at 54, sister died MI at 63 HTN: mother, sisters DM: mother, sisters, children Cancer: ENT (brother, father), Lung (sister)  Social History: Pt is currently retired from her career as a Corporate treasurer. She lives along with her dog and volunteers at the [employer] to stay active. She has 3 children and 13 grandchildren and 4 great-grandchildren.  She currently smokes 0.5 ppd with a ~20 pack year hx. She does not drink alcohol or use illicit drugs.  Review of Systems: A complete ROS was negative except as per HPI. Review of Systems  Constitutional: Positive for diaphoresis. Negative for fever and malaise/fatigue.  Eyes: Negative for blurred vision.  Respiratory: Positive for shortness of breath. Negative for cough and wheezing.   Cardiovascular: Positive for chest pain and palpitations. Negative for leg swelling.  Gastrointestinal: Positive for abdominal pain (LLQ) and heartburn. Negative for constipation, diarrhea, nausea and vomiting.  Musculoskeletal: Positive for back pain (low back w/ bilateral leg radiation). Negative for myalgias.  Neurological: Positive for dizziness and headaches. Negative for weakness.  Psychiatric/Behavioral: Negative for depression and substance abuse. The patient is not nervous/anxious.     Physical Exam: Vitals:   01/07/16 0845 01/07/16 0857 01/07/16 0900 01/07/16 0915  BP: 103/68  99/68 109/72  Pulse:  84 81 60  Resp: Temp:      TempSrc:      SpO2:  98% 99% 99%  Weight:      Height:       Physical Exam  Constitutional: She is oriented to person, place, and time. She appears well-developed and well-nourished. No distress.  HENT:  Head: Normocephalic and atraumatic.  Mouth/Throat: Oropharynx is clear and moist.  Eyes: Conjunctivae are normal. Pupils are equal, round, and reactive to light.  Neck: Normal range of motion. Neck supple. No JVD present.  Cardiovascular: Normal rate,  regular rhythm and intact distal pulses.  Exam reveals gallop and S4.   Pulmonary/Chest: Effort normal and breath sounds normal. No respiratory distress. She has no wheezes. She exhibits no tenderness.  Abdominal: Soft. Bowel sounds are normal. She exhibits no distension. There is no tenderness. There is no rebound and no guarding.  Musculoskeletal: She exhibits no edema or tenderness.  Neurological: She is alert and oriented to person, place, and time.  Skin: Skin is warm and dry. She is not diaphoretic.  Psychiatric: She has a normal mood and affect. Her behavior is normal. Judgment and thought content normal.   Labs: CBC:  Recent Labs Lab 01/07/16 0803  WBC 7.3  HGB 14.6  HCT 45.3  MCV 92.3  PLT 354  Basic Metabolic Panel:  Recent Labs Lab 01/07/16 0803  NA 138  K 2.8*  CL 98*  CO2 30  GLUCOSE 130*  BUN 8  CREATININE 0.83  CALCIUM 9.9   Cardiac Enzymes:  Recent Labs Lab 01/07/16 0813  TROPIPOC 0.00   Urine Studies: Drugs of Abuse     Component Value Date/Time   LABOPIA NONE DETECTED 01/05/2011 0553   COCAINSCRNUR NONE DETECTED 01/05/2011 0553   LABBENZ NONE DETECTED 01/05/2011 0553   AMPHETMU NONE DETECTED 01/05/2011 0553   THCU NONE DETECTED 01/05/2011 0553   LABBARB NONE DETECTED 01/05/2011 0553    EKG: EKG: no significant change from prior, normal sinus rhythm, normal axis, flattened P-wave in II/III, noisy baseline w/ possible non-specific T wave changes in lateral leads.  Imaging: Dg Chest 2 View  Result Date: 01/07/2016 CLINICAL DATA:  Chest pain, pressure that woke the patient up this morning. She stated that she's been having the pain for a few days, but worse today. Pain shooting down both legs. Shortness of breath. Hx asthma, HTN EXAM: CHEST  2 VIEW COMPARISON:  06/08/2015 FINDINGS: Apical lordotic projection.  Thoracic spondylosis.  Emphysema noted. Mild thoracic spondylosis. No pleural effusion. The lungs appear otherwise clear. IMPRESSION: 1.  Emphysema. 2. Thoracic spondylosis. Electronically Signed   By: Gaylyn Rong M.D.   On: 01/07/2016 08:31   Assessment & Plan by Problem: Principal Problem:   Chest pain Active Problems:   History of total left hip replacement   Hypokalemia   HTN (hypertension)  Ms. [name] is a 60 y.o. female with CP and hypokalemia.  1) CP: Typical CP for ACS, suspect unstable angina. Initial EKG w/o evidence of acute ischemia. Trop neg at 0.00. Normal cath 2006 w/o CAD. Risk factors are HTN, smoker, sig fam hx. - admit to tele for Obs - ACS dose heparin per pharm - nitro PRN CP - continue ASA 81mg  - trend Trops x 2 - consider Lipids, A1c, TSH for ASCVD risk - Cards c/s, appreciate recs  2) Hypokalemia: K chronically low over past year. Now 2.8. Will consider d/c HCTZ. - hold enalapril-HCTZ - KDur PRN K<3.5  3) HTN: controlled. BP low normal ~100/70. Home meds: enalapril-HCTZ 10-25mg . Would consider d/c HCTZ 2/2 low potasium and good control of BP. - hold home meds for low BP  4) Chronic pain: low back controlled with percocet 10-325 q4h. - continue home meds  FEN/GI: no IVF, Full diet  DVT PPx - heparin (ACS therapeutic dose)  Code Status - Full  Consults - Cardiology  Dispo: Admit patient to tele for Observation.  Signed: Carolynn Comment, MD 01/07/2016, 9:32 AM  Pager: [phone]

## 2016-01-07 NOTE — Progress Notes (Signed)
ANTICOAGULATION CONSULT NOTE - Initial Consult  Pharmacy Consult for Lovenox Indication:  VTE prophylaxis   No Known Allergies  Patient Measurements: Height: 5\' 1"  (154.9 cm) Weight: 140 lb (63.5 kg) IBW/kg (Calculated) : 47.8 Heparin Dosing Weight: 60.9 kg  Vital Signs: Temp: 98.2 F (36.8 C) (08/12 1124) Temp Source: Oral (08/12 1124) BP: 110/71 (08/12 1124) Pulse Rate: 57 (08/12 1124)  Labs:  Recent Labs  01/07/16 0803 01/07/16 1005 01/07/16 1458  HGB 14.6  --   --   HCT 45.3  --   --   PLT 354  --   --   CREATININE 0.83  --   --   TROPONINI  --  <0.03 <0.03    Estimated Creatinine Clearance: 60 mL/min (by C-G formula based on SCr of 0.83 mg/dL).   Medical History: Past Medical History:  Diagnosis Date  . Arthritis   . Asthma   . Chest pain 2006  . Chronic back pain   . Hypertension   . Shortness of breath    due to  smoking    Assessment: 60 year old female with PMH significant for HTN, smoking and family history admitted with chest pain.   Patient was not on anticoagulation prior to admission. Patient was previously on heparin drip for ACS and has been switched to lovenox for dvt prophylaxis. Cardiology has planned a nuclear stress test for tomorrow.     Plan:  Lovenox 40 mg Q 24H Monitor cbc, s/sx of bleeding   Bailey MechEmily Stewart, PharmD Pharmacy Resident [phone] 01/07/2016,5:01 PM

## 2016-01-07 NOTE — Consult Note (Signed)
Primary cardiologist:N/A Consulting cardiologist: Dr Dina RichJonathan Branch MD Requesting physician: Carolynn CommentBryan Strelow MD Indication: Chest pain  Clinical Summary [name] is a 60 y.o.female history asthma, tobacco abuse,family history of CAD, and HTN presents with chest pain.  She reports chest pain on and off since 2006, at that time she had a cath without significant CAD. Most recent episode around 4AM this morning, awoke her from sleep. Pressing like feeling in midchest, 8-9/10 in severity, +SOB. Tingling down left arm. Not positional. Lasted approximately 2-3 hours. Reports increased DOE over the last several months, now DOE just walking inside from her car.     K 2.8, Cr 0.83, Hgb 14.6, Plt 354, trop neg x2,  CXR +emphysema, no acute changes EKG SR, nonspecific ST/T changes Cath 2006 No CAD   No Known Allergies  Medications Scheduled Medications:     Infusions: . heparin 700 Units/hr (01/07/16 1153)     PRN Medications:  acetaminophen, morphine injection, nitroGLYCERIN, ondansetron (ZOFRAN) IV   Past Medical History:  Diagnosis Date  . Arthritis   . Asthma   . Chest pain 2006  . Chronic back pain   . Hypertension   . Shortness of breath    due to  smoking     Past Surgical History:  Procedure Laterality Date  . ABDOMINAL HYSTERECTOMY  1997  . CARDIAC CATHETERIZATION  2006  . HEMI-MICRODISCECTOMY LUMBAR LAMINECTOMY LEVEL 1  05/14/2012   Procedure: HEMI-MICRODISCECTOMY LUMBAR LAMINECTOMY LEVEL 1;  Surgeon: Jacki Conesonald A Gioffre, MD;  Location: [facility];  Service: Orthopedics;  Laterality: N/A;  Hemi Laminectomy Microdiscectomy L4 - L5 Central (X-Ray)  . ingrown toenail removal    . TONSILLECTOMY    . TOTAL HIP ARTHROPLASTY Left 11/05/2013   Procedure: LEFT TOTAL HIP ARTHROPLASTY;  Surgeon: Jacki Conesonald A Gioffre, MD;  Location: [facility];  Service: Orthopedics;  Laterality: Left;    Family History  Problem Relation Age of Onset  . Heart attack Mother 1257  . Hypertension  Sister   . Diabetes type II Sister   . Tongue cancer Brother   . Heart attack Sister 3965  . Lung cancer Sister   . Hypertension Sister     Social History [name] reports that she has been smoking Cigarettes.  She has a 15.00 pack-year smoking history. She has never used smokeless tobacco. [name] reports that she does not drink alcohol.  Review of Systems CONSTITUTIONAL: No weight loss, fever, chills, weakness or fatigue.  HEENT: Eyes: No visual loss, blurred vision, double vision or yellow sclerae. No hearing loss, sneezing, congestion, runny nose or sore throat.  SKIN: No rash or itching.  CARDIOVASCULAR: per HPI RESPIRATORY:per HPI GASTROINTESTINAL: No anorexia, nausea, vomiting or diarrhea. No abdominal pain or blood.  GENITOURINARY: no polyuria, no dysuria NEUROLOGICAL: No headache, dizziness, syncope, paralysis, ataxia, numbness or tingling in the extremities. No change in bowel or bladder control.  MUSCULOSKELETAL: No muscle, back pain, joint pain or stiffness.  HEMATOLOGIC: No anemia, bleeding or bruising.  LYMPHATICS: No enlarged nodes. No history of splenectomy.  PSYCHIATRIC: No history of depression or anxiety.      Physical Examination Blood pressure 110/71, pulse (!) 57, temperature 98.2 F (36.8 C), temperature source Oral, resp. rate 20, height 5\' 1"  (1.549 m), weight 140 lb (63.5 kg), SpO2 100 %. No intake or output data in the 24 hours ending 01/07/16 1456  HEENT: sclera clear, throat clear  Cardiovascular: RRR, no m/r/g, no jvd  Respiratory: CTAB  GI: abdomen soft, NT,  ND  MSK: no LE edema  Neuro: no focal deficits  Psych: appropriate affect   Lab Results  Basic Metabolic Panel:  Recent Labs Lab 01/07/16 0803 01/07/16 1005  NA 138  --   K 2.8*  --   CL 98*  --   CO2 30  --   GLUCOSE 130*  --   BUN 8  --   CREATININE 0.83  --   CALCIUM 9.9  --   MG  --  2.0    Liver Function Tests: No results for input(s): AST, ALT, ALKPHOS,  BILITOT, PROT, ALBUMIN in the last 168 hours.  CBC:  Recent Labs Lab 01/07/16 0803  WBC 7.3  HGB 14.6  HCT 45.3  MCV 92.3  PLT 354    Cardiac Enzymes:  Recent Labs Lab 01/07/16 1005  TROPONINI <0.03    BNP: Invalid input(s): POCBNP     Impression/Recommendations 1. Chest pain - somewhat atypical for cardiac chest pain. Several year history with negative cath in 2006. Reports increased DOE as well over the last few months - several CAD risk factors - we will plan for exercise nuclear stress tomorrow     Dina Rich, M.D.

## 2016-01-08 ENCOUNTER — Observation Stay (HOSPITAL_BASED_OUTPATIENT_CLINIC_OR_DEPARTMENT_OTHER): Payer: Managed Care, Other (non HMO)

## 2016-01-08 ENCOUNTER — Observation Stay (HOSPITAL_COMMUNITY): Payer: Managed Care, Other (non HMO)

## 2016-01-08 DIAGNOSIS — R0789 Other chest pain: Secondary | ICD-10-CM | POA: Diagnosis not present

## 2016-01-08 DIAGNOSIS — R079 Chest pain, unspecified: Secondary | ICD-10-CM

## 2016-01-08 DIAGNOSIS — I10 Essential (primary) hypertension: Secondary | ICD-10-CM

## 2016-01-08 LAB — NM MYOCAR MULTI W/SPECT W/WALL MOTION / EF
CSEPEDS: 0 s
CSEPEW: 1 METS
CSEPHR: 57 %
CSEPPHR: 91 {beats}/min
Exercise duration (min): 0 min
MPHR: 158 {beats}/min
Rest HR: 60 {beats}/min

## 2016-01-08 LAB — CBC
HCT: 37.6 % (ref 36.0–46.0)
Hemoglobin: 11.8 g/dL — ABNORMAL LOW (ref 12.0–15.0)
MCH: 29.4 pg (ref 26.0–34.0)
MCHC: 31.4 g/dL (ref 30.0–36.0)
MCV: 93.5 fL (ref 78.0–100.0)
PLATELETS: 313 10*3/uL (ref 150–400)
RBC: 4.02 MIL/uL (ref 3.87–5.11)
RDW: 14.2 % (ref 11.5–15.5)
WBC: 6.9 10*3/uL (ref 4.0–10.5)

## 2016-01-08 LAB — BASIC METABOLIC PANEL
ANION GAP: 8 (ref 5–15)
BUN: 12 mg/dL (ref 6–20)
CO2: 31 mmol/L (ref 22–32)
Calcium: 9.2 mg/dL (ref 8.9–10.3)
Chloride: 99 mmol/L — ABNORMAL LOW (ref 101–111)
Creatinine, Ser: 0.8 mg/dL (ref 0.44–1.00)
GFR calc Af Amer: >60 mL/min (ref >60)
GFR calc non Af Amer: >60 mL/min (ref >60)
GLUCOSE: 83 mg/dL (ref 65–99)
POTASSIUM: 3.7 mmol/L (ref 3.5–5.1)
Sodium: 138 mmol/L (ref 135–145)

## 2016-01-08 MED ORDER — ENALAPRIL MALEATE 10 MG PO TABS: 10.0000 mg | ORAL_TABLET | Freq: Every day | ORAL | 2 refills | Status: DC

## 2016-01-08 MED ORDER — TECHNETIUM TC 99M TETROFOSMIN IV KIT
10.0000 | PACK | Freq: Once | INTRAVENOUS | Status: AC | PRN
  Administered 2016-01-08: 10 via INTRAVENOUS

## 2016-01-08 MED ORDER — REGADENOSON 0.4 MG/5ML IV SOLN
INTRAVENOUS | Status: AC
  Filled 2016-01-08: qty 5

## 2016-01-08 MED ORDER — REGADENOSON 0.4 MG/5ML IV SOLN
0.4000 mg | Freq: Once | INTRAVENOUS | Status: AC
  Administered 2016-01-08: 0.4 mg via INTRAVENOUS
  Filled 2016-01-08: qty 5

## 2016-01-08 MED ORDER — ENOXAPARIN SODIUM 40 MG/0.4ML ~~LOC~~ SOLN: 40.0000 mg | SUBCUTANEOUS | Status: DC

## 2016-01-08 MED ORDER — TECHNETIUM TC 99M TETROFOSMIN IV KIT
30.0000 | PACK | Freq: Once | INTRAVENOUS | Status: AC | PRN
  Administered 2016-01-08: 30 via INTRAVENOUS

## 2016-01-08 MED ORDER — HYDROCHLOROTHIAZIDE 25 MG PO TABS: 25.0000 mg | ORAL_TABLET | Freq: Every day | ORAL | Status: DC

## 2016-01-08 MED ORDER — ENALAPRIL MALEATE 10 MG PO TABS
10.0000 mg | ORAL_TABLET | Freq: Every day | ORAL | Status: DC
  Administered 2016-01-08: 10 mg via ORAL
  Filled 2016-01-08: qty 1

## 2016-01-08 NOTE — Progress Notes (Signed)
  Date: 01/08/2016  Patient name: Fransisca KaufmannRenee E Poulsen  Medical record number: [id number]  Date of birth: June 13, 1955   I have seen and evaluated Jolea E Mcculley and discussed their care with the Residency Team. Ms Steve RattlerMinder is a 60 yo female with HTN who presented with substernal chest pressure - like someone sitting on her chest - with radiation to B arms. She tries to shake the shooting pain out of her arms. She can get the CP with and without exertion. She can get the arm pain with and without the CP. She has been tx these episodes with antiacids without improvement.   Chart review - cath in 2006 EF 65%, no CAD  PMHx, Fam Hx, and/or Soc Hx : PMHx sig for HTN since in her 20's, tobacco use. Fam hx - son with DM and HTN. Mother Dm and leg amputation. Grandmother lived to age 72100. Social hx : smokes tobacco. Volunteers at [employer]  Vitals:   01/08/16 1044 01/08/16 1046  BP: (!) 102/58 (!) 107/56  Pulse: 88 76  Resp:    Temp:    Afebrile RR 18 Sitting side of bed. NAD Able to stand and walk indep HRRR no MRG LCTAB anteriorly ABD + BS soft Ext no edema Neuro no focal  I personally viewed her CXR images and confirmed by reading with the official read. No infiltrate  I personally viewed her EKG and confirmed by reading with the official read. Sinus, nl axis, inverted T waves info and lateral  Trop I negative x 3  Assessment and Plan: I have seen and evaluated the patient as outlined above. I agree with the formulated Assessment and Plan as detailed in the residents' note, with the following changes:  Ms Steve RattlerMinder is a 60 yo female with HTN who presented with substernal chest pressure and B arm pain. This was concerning for unstable angina and cardiology performed a stress test which was low risk. While there is a low chance that this is a false negative, combined with her nl cath in 2006 it is likely a true negative and she does not have CAD as the cause of her CP and B arm pain. She is now stable  for d/C and [facility] medical issues.  1. Non cardiac CP - reassurance. D/C. [facility] F/u  2. HTN - agree with stopping her HCTZ 2/2 hypokalemia. Cont ACE I. Bo good today - F/U outpt and may need 2nd agent as outpt - maybe norvasc,  3. Hypokalemia - 2/2 HCTZ. Resolved. Stop HCTZ  [facility] home. [facility]  Burns SpainElizabeth A Butcher, MD 8/13/20171:42 PM

## 2016-01-08 NOTE — Discharge Summary (Signed)
Name: [name] MRN: [id number] DOB: 1955-10-23 60 y.o. PCP: Pcp Not In System  Date of Admission: 01/07/2016  7:46 AM Date of Discharge: 01/08/2016 Attending Physician: Burns Spain, MD  Discharge Diagnosis: 1. CP 2. HTN  Principal Problem:   Unstable angina (HCC) Active Problems:   Hypokalemia   HTN (hypertension)   Discharge Medications:   Medication List    STOP taking these medications   enalapril-hydrochlorothiazide 10-25 MG tablet Commonly known as:  VASERETIC     TAKE these medications   albuterol 108 (90 Base) MCG/ACT inhaler Commonly known as:  PROVENTIL HFA;VENTOLIN HFA Inhale 2 puffs into the lungs every 4 (four) hours as needed for wheezing or shortness of breath.   aspirin 81 MG chewable tablet Chew 1 tablet (81 mg total) by mouth daily.   diphenhydrAMINE 25 MG tablet Commonly known as:  BENADRYL Take 1 tablet (25 mg total) by mouth every 6 (six) hours.   enalapril 10 MG tablet Commonly known as:  VASOTEC Take 1 tablet (10 mg total) by mouth daily.   famotidine 20 MG tablet Commonly known as:  PEPCID Take 1 tablet (20 mg total) by mouth 2 (two) times daily.   multivitamin with minerals Tabs tablet Take 1 tablet by mouth daily.   nicotine 14 mg/24hr patch Commonly known as:  NICODERM CQ - dosed in mg/24 hours Place 14 mg onto the skin daily.   omeprazole 20 MG capsule Commonly known as:  PRILOSEC Take 1 capsule (20 mg total) by mouth daily.   oxyCODONE-acetaminophen 10-325 MG tablet Commonly known as:  PERCOCET Take 1 tablet by mouth every 4 (four) hours as needed for pain.   potassium chloride SA 20 MEQ tablet Commonly known as:  K-DUR,KLOR-CON Take 1 tablet (20 mEq total) by mouth 2 (two) times daily. For 10days then take qd.   zolpidem 10 MG tablet Commonly known as:  AMBIEN Take 10 mg by mouth at bedtime as needed for sleep.       Disposition and follow-up:   Ms.[name] was discharged from [facility] in Good condition.  At the hospital follow up visit please address:  1.  CP: How is pain? Any further episodes? Consider w/u for radicular pain.  2.  Labs / imaging needed at time of follow-up: none  3.  Pending labs/ test needing follow-up: none  Follow-up Appointments: Follow-up Information    [facility]. Call today.   Why:  Please call on Monday to schedule a follow-up appointment in 1-2 weeks. Contact information: [address] [place] [address] [phone]         Hospital Course by problem list: Principal Problem:   Unstable angina (HCC) Active Problems:   Hypokalemia   HTN (hypertension)   1. CP: Patient presented with typical ACS chest pain she described as pressure over substernal region radiating to the right and left arms. She notes that this pain has been ongoing for several months, she has been treating it with reflux medications. She does endorse that this pain lasts for several hours before resolving. She also notes that she does occasionally have symptoms of pain in her upper extremities without association with chest pain. She also notes she gets occasional shortness of breath that occurs with these episodes, however this shortness of breath has also occurred without chest pain. She was evaluated by cardiology on her admission and a stress test was performed. The stress test result of  low risk, no ischemia, normal LV function. She had negative troponins 3, and EKG showed some questionable T-wave changes in the inferior lateral leads. Cardiology was not concerned for ischemia.  In the absence of cardiac causes for her pain, it is most likely due to cervical radicular pain. She has had a h/o radicular pain in the lower spine radiating down her legs. We would recommend continued continued evaluation in the outpatient setting.  2. HTN: BP stable during admission. Patient with BP well controlled on  enalipril-HCTZ 10-25mg . However, pt has chronic hypokalemia. Will plan to DC w/ enalipril alone and f/u BP as outpt in order to avoid hypokalemia.  Discharge Vitals:   BP (!) 107/56   Pulse 76   Temp 98.7 F (37.1 C) (Oral)   Resp 18   Ht 5\' 1"  (1.549 m)   Wt 140 lb (63.5 kg)   SpO2 97%   BMI 26.45 kg/m   Pertinent Labs, Studies, and Procedures: Negative troponins x 3.  Procedures Performed:  Dg Chest 2 View  Result Date: 01/07/2016 CLINICAL DATA:  Chest pain, pressure that woke the patient up this morning. She stated that she's been having the pain for a few days, but worse today. Pain shooting down both legs. Shortness of breath. Hx asthma, HTN EXAM: CHEST  2 VIEW COMPARISON:  06/08/2015 FINDINGS: Apical lordotic projection.  Thoracic spondylosis.  Emphysema noted. Mild thoracic spondylosis. No pleural effusion. The lungs appear otherwise clear. IMPRESSION: 1. Emphysema. 2. Thoracic spondylosis. Electronically Signed   By: Gaylyn RongWalter  Liebkemann M.D.   On: 01/07/2016 08:31   Nm Myocar Multi W/spect W/wall Motion / Ef  Result Date: 01/08/2016 CLINICAL DATA:  Chest pain EXAM: MYOCARDIAL IMAGING WITH SPECT (REST AND PHARMACOLOGIC-STRESS) GATED LEFT VENTRICULAR WALL MOTION STUDY LEFT VENTRICULAR EJECTION FRACTION TECHNIQUE: Standard myocardial SPECT imaging was performed after resting intravenous injection of 10 mCi Tc-8095m tetrofosmin. Subsequently, intravenous infusion of Lexiscan was performed under the supervision of the Cardiology staff. At peak effect of the drug, 30 mCi Tc-7795m tetrofosmin was injected intravenously and standard myocardial SPECT imaging was performed. Quantitative gated imaging was also performed to evaluate left ventricular wall motion, and estimate left ventricular ejection fraction. COMPARISON:  None. FINDINGS: Perfusion: No decreased activity in the left ventricle on stress imaging to suggest reversible ischemia or infarction. Wall Motion: Normal left ventricular wall  motion. No left ventricular dilation. Left Ventricular Ejection Fraction: 68 % End diastolic volume 75 ml End systolic volume 24 ml IMPRESSION: 1. No reversible ischemia or infarction. 2. Normal left ventricular wall motion. 3. Left ventricular ejection fraction 68% 4. Non invasive risk stratification*: Low risk. *2012 Appropriate Use Criteria for Coronary Revascularization Focused Update: J Am Coll Cardiol. 2012;59(9):857-881. http://content.dementiazones.comonlinejacc.org/article.aspx?articleid=1201161 Electronically Signed   By: Jolaine ClickArthur  Hoss M.D.   On: 01/08/2016 12:31   Consultations: Treatment Team:  Antoine PocheJonathan F Branch, MD  Cardiology  Discharge Instructions: Discharge Instructions    Call MD for:  difficulty breathing, headache or visual disturbances    Complete by:  As directed   Call MD for:  persistant dizziness or light-headedness    Complete by:  As directed   Call MD for:  persistant nausea and vomiting    Complete by:  As directed   Diet - low sodium heart healthy    Complete by:  As directed   Discharge instructions    Complete by:  As directed   Review determine that your pain is not due to problems with her heart. That this is due  to problems with the nerves in her neck. We would like to continue to evaluate this problem in the outpatient setting and encouraged to follow-up with our clinic.  Terms of managing her blood pressure, we will prescribe enalapril which is one of the medications you were taking previously. We will like you to take this medication alone without the HCTZ medication in order to prevent your potassium from becoming low. We will reevaluate your blood pressure after follow-up appointment indigestion medications as necessary.   Increase activity slowly    Complete by:  As directed     Signed: Carolynn Comment, MD 01/08/2016, 1:49 PM   Pager: [phone]

## 2016-01-08 NOTE — Progress Notes (Signed)
     Subjective:    Mild chest pain overnight.   Objective:   Temp:  [98.2 F (36.8 C)-98.7 F (37.1 C)] 98.7 F (37.1 C) (08/13 0412) Pulse Rate:  [54-85] 64 (08/13 0756) Resp:  [14-20] 18 (08/13 0412) BP: (99-129)/(68-82) 129/82 (08/13 0756) SpO2:  [97 %-100 %] 97 % (08/13 0412)    Filed Weights   01/07/16 0749  Weight: 140 lb (63.5 kg)   No intake or output data in the 24 hours ending 01/08/16 0815  Telemetry: NSR  Exam:  General: NAD  HEENT: sclera clear, throat clear  Resp: CTAB  Cardiac: RRR, no m/r/g, no jvd  GI: abdomen soft, NT, ND  MSK: no LE edema  Neuro: no focal deficits  Psych: appropirate affect  Lab Results:  Basic Metabolic Panel:  Recent Labs Lab 01/07/16 0803 01/07/16 1005  NA 138  --   K 2.8*  --   CL 98*  --   CO2 30  --   GLUCOSE 130*  --   BUN 8  --   CREATININE 0.83  --   CALCIUM 9.9  --   MG  --  2.0    Liver Function Tests: No results for input(s): AST, ALT, ALKPHOS, BILITOT, PROT, ALBUMIN in the last 168 hours.  CBC:  Recent Labs Lab 01/07/16 0803 01/08/16 0246  WBC 7.3 6.9  HGB 14.6 11.8*  HCT 45.3 37.6  MCV 92.3 93.5  PLT 354 313    Cardiac Enzymes:  Recent Labs Lab 01/07/16 1005 01/07/16 1458 01/07/16 2106  TROPONINI <0.03 <0.03 <0.03    BNP: No results for input(s): PROBNP in the last 8760 hours.  Coagulation: No results for input(s): INR in the last 168 hours.  ECG:   Medications:   Scheduled Medications: . enoxaparin (LOVENOX) injection  40 mg Subcutaneous Q24H     Infusions:     PRN Medications:  acetaminophen, morphine injection, nitroGLYCERIN, ondansetron (ZOFRAN) IV     Assessment/Plan    1. Chest pain - somewhat atypical for cardiac chest pain. Several year history with negative cath in 2006. Reports increased DOE as well over the last few months - several CAD risk factors - we will plan for exercise nuclear stress today. She has echo pending as well.   2.  HTN - resume bp meds today. She reports on current regimen for nearly 40 years. Mild low bp's yesterday, we will resume prior regimen and monitor at this time.      Dina RichJonathan Labrisha Wuellner, M.D.

## 2016-01-08 NOTE — Progress Notes (Signed)
   [name] presented for a lexiscan cardiolite today.  No immediate complications.  Stress imaging is pending at this time.  [name], NP 01/08/2016, 10:41 AM

## 2016-01-08 NOTE — Discharge Planning (Deleted)
Name: [name] MRN: [id number] DOB: 12-09-1955 60 y.o. PCP: Pcp Not In System  Date of Admission: 01/07/2016  7:46 AM Date of Discharge: 01/08/2016 Attending Physician: Burns Spain, MD  Discharge Diagnosis: 1. CP 2. HTN  Principal Problem:   Unstable angina (HCC) Active Problems:   Hypokalemia   HTN (hypertension)   Discharge Medications:   Medication List    STOP taking these medications   enalapril-hydrochlorothiazide 10-25 MG tablet Commonly known as:  VASERETIC     TAKE these medications   albuterol 108 (90 Base) MCG/ACT inhaler Commonly known as:  PROVENTIL HFA;VENTOLIN HFA Inhale 2 puffs into the lungs every 4 (four) hours as needed for wheezing or shortness of breath.   aspirin 81 MG chewable tablet Chew 1 tablet (81 mg total) by mouth daily.   diphenhydrAMINE 25 MG tablet Commonly known as:  BENADRYL Take 1 tablet (25 mg total) by mouth every 6 (six) hours.   enalapril 10 MG tablet Commonly known as:  VASOTEC Take 1 tablet (10 mg total) by mouth daily.   famotidine 20 MG tablet Commonly known as:  PEPCID Take 1 tablet (20 mg total) by mouth 2 (two) times daily.   multivitamin with minerals Tabs tablet Take 1 tablet by mouth daily.   nicotine 14 mg/24hr patch Commonly known as:  NICODERM CQ - dosed in mg/24 hours Place 14 mg onto the skin daily.   omeprazole 20 MG capsule Commonly known as:  PRILOSEC Take 1 capsule (20 mg total) by mouth daily.   oxyCODONE-acetaminophen 10-325 MG tablet Commonly known as:  PERCOCET Take 1 tablet by mouth every 4 (four) hours as needed for pain.   potassium chloride SA 20 MEQ tablet Commonly known as:  K-DUR,KLOR-CON Take 1 tablet (20 mEq total) by mouth 2 (two) times daily. For 10days then take qd.   zolpidem 10 MG tablet Commonly known as:  AMBIEN Take 10 mg by mouth at bedtime as needed for sleep.       Disposition and follow-up:   Ms.[name] was discharged from [facility] in Good condition.  At the hospital follow up visit please address:  1.  CP: How is pain? Any further episodes? Consider w/u for radicular pain.  2.  Labs / imaging needed at time of follow-up: none  3.  Pending labs/ test needing follow-up: none  Follow-up Appointments: Follow-up Information    [facility]. Call today.   Why:  Please call on Monday to schedule a follow-up appointment in 1-2 weeks. Contact information: [address] [place] [address] [phone]         Hospital Course by problem list: Principal Problem:   Unstable angina (HCC) Active Problems:   Hypokalemia   HTN (hypertension)   1. CP: Patient presented with typical ACS chest pain she described as pressure over substernal region radiating to the right and left arms. She notes that this pain has been ongoing for several months, she has been treating it with reflux medications. She does endorse that this pain lasts for several hours before resolving. She also notes that she does occasionally have symptoms of pain in her upper extremities without association with chest pain. She also notes she gets occasional shortness of breath that occurs with these episodes, however this shortness of breath has also occurred without chest pain. She was evaluated by cardiology on her admission and a stress test was performed. The stress test result of  low risk, no ischemia, normal LV function. She had negative troponins 3, and EKG showed some questionable T-wave changes in the inferior lateral leads. Cardiology was not concerned for ischemia.  In the absence of cardiac causes for her pain, it is most likely due to cervical radicular pain. She has had a h/o radicular pain in the lower spine radiating down her legs. We would recommend continued continued evaluation in the outpatient setting.  2. HTN: BP stable during admission. Patient with BP well controlled on  enalipril-HCTZ 10-25mg . However, pt has chronic hypokalemia. Will plan to DC w/ enalipril alone and f/u BP as outpt in order to avoid hypokalemia.  Discharge Vitals:   BP (!) 107/56   Pulse 76   Temp 98.7 F (37.1 C) (Oral)   Resp 18   Ht 5\' 1"  (1.549 m)   Wt 140 lb (63.5 kg)   SpO2 97%   BMI 26.45 kg/m   Pertinent Labs, Studies, and Procedures: Negative troponins x 3.  Procedures Performed:  Dg Chest 2 View  Result Date: 01/07/2016 CLINICAL DATA:  Chest pain, pressure that woke the patient up this morning. She stated that she's been having the pain for a few days, but worse today. Pain shooting down both legs. Shortness of breath. Hx asthma, HTN EXAM: CHEST  2 VIEW COMPARISON:  06/08/2015 FINDINGS: Apical lordotic projection.  Thoracic spondylosis.  Emphysema noted. Mild thoracic spondylosis. No pleural effusion. The lungs appear otherwise clear. IMPRESSION: 1. Emphysema. 2. Thoracic spondylosis. Electronically Signed   By: Gaylyn RongWalter  Liebkemann M.D.   On: 01/07/2016 08:31   Nm Myocar Multi W/spect W/wall Motion / Ef  Result Date: 01/08/2016 CLINICAL DATA:  Chest pain EXAM: MYOCARDIAL IMAGING WITH SPECT (REST AND PHARMACOLOGIC-STRESS) GATED LEFT VENTRICULAR WALL MOTION STUDY LEFT VENTRICULAR EJECTION FRACTION TECHNIQUE: Standard myocardial SPECT imaging was performed after resting intravenous injection of 10 mCi Tc-9157m tetrofosmin. Subsequently, intravenous infusion of Lexiscan was performed under the supervision of the Cardiology staff. At peak effect of the drug, 30 mCi Tc-8257m tetrofosmin was injected intravenously and standard myocardial SPECT imaging was performed. Quantitative gated imaging was also performed to evaluate left ventricular wall motion, and estimate left ventricular ejection fraction. COMPARISON:  None. FINDINGS: Perfusion: No decreased activity in the left ventricle on stress imaging to suggest reversible ischemia or infarction. Wall Motion: Normal left ventricular wall  motion. No left ventricular dilation. Left Ventricular Ejection Fraction: 68 % End diastolic volume 75 ml End systolic volume 24 ml IMPRESSION: 1. No reversible ischemia or infarction. 2. Normal left ventricular wall motion. 3. Left ventricular ejection fraction 68% 4. Non invasive risk stratification*: Low risk. *2012 Appropriate Use Criteria for Coronary Revascularization Focused Update: J Am Coll Cardiol. 2012;59(9):857-881. http://content.dementiazones.comonlinejacc.org/article.aspx?articleid=1201161 Electronically Signed   By: Jolaine ClickArthur  Hoss M.D.   On: 01/08/2016 12:31   Consultations: Treatment Team:  Antoine PocheJonathan F Branch, MD  Cardiology  Discharge Instructions: Discharge Instructions    Call MD for:  difficulty breathing, headache or visual disturbances    Complete by:  As directed   Call MD for:  persistant dizziness or light-headedness    Complete by:  As directed   Call MD for:  persistant nausea and vomiting    Complete by:  As directed   Diet - low sodium heart healthy    Complete by:  As directed   Discharge instructions    Complete by:  As directed   Review determine that your pain is not due to problems with her heart. That this is due  to problems with the nerves in her neck. We would like to continue to evaluate this problem in the outpatient setting and encouraged to follow-up with our clinic.  Terms of managing her blood pressure, we will prescribe enalapril which is one of the medications you were taking previously. We will like you to take this medication alone without the HCTZ medication in order to prevent your potassium from becoming low. We will reevaluate your blood pressure after follow-up appointment indigestion medications as necessary.   Increase activity slowly    Complete by:  As directed     Signed: Carolynn Comment, MD 01/08/2016, 1:38 PM   Pager: [phone]

## 2016-01-08 NOTE — Progress Notes (Signed)
Stress test is negative. F/u echo, likely can be discharged later today from cardiac standpoint. We will arrange follow up in 3 weeks.   Dominga FerryJ Branch MD

## 2016-01-21 IMAGING — CR DG FOOT COMPLETE 3+V*R*
3 series · 3 of 3 positions shown · non-contrast
Comparison: 06/13/2006

CLINICAL DATA: Fourth metatarsal injury after trauma yesterday.
Initial encounter.

EXAM:
RIGHT FOOT COMPLETE - 3+ VIEW

[x foot ap right]
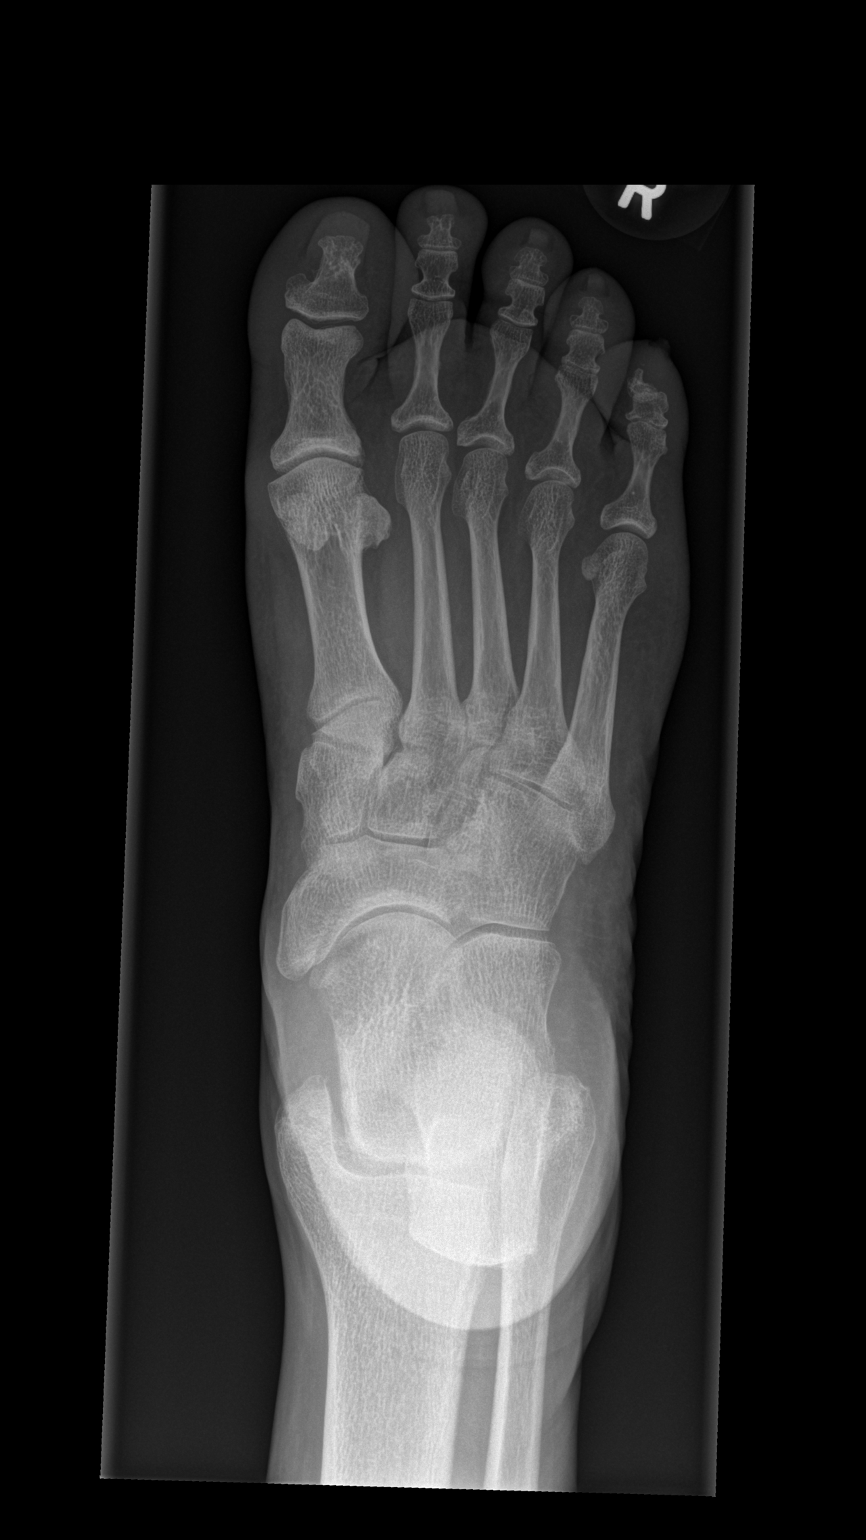

[x foot obl right]
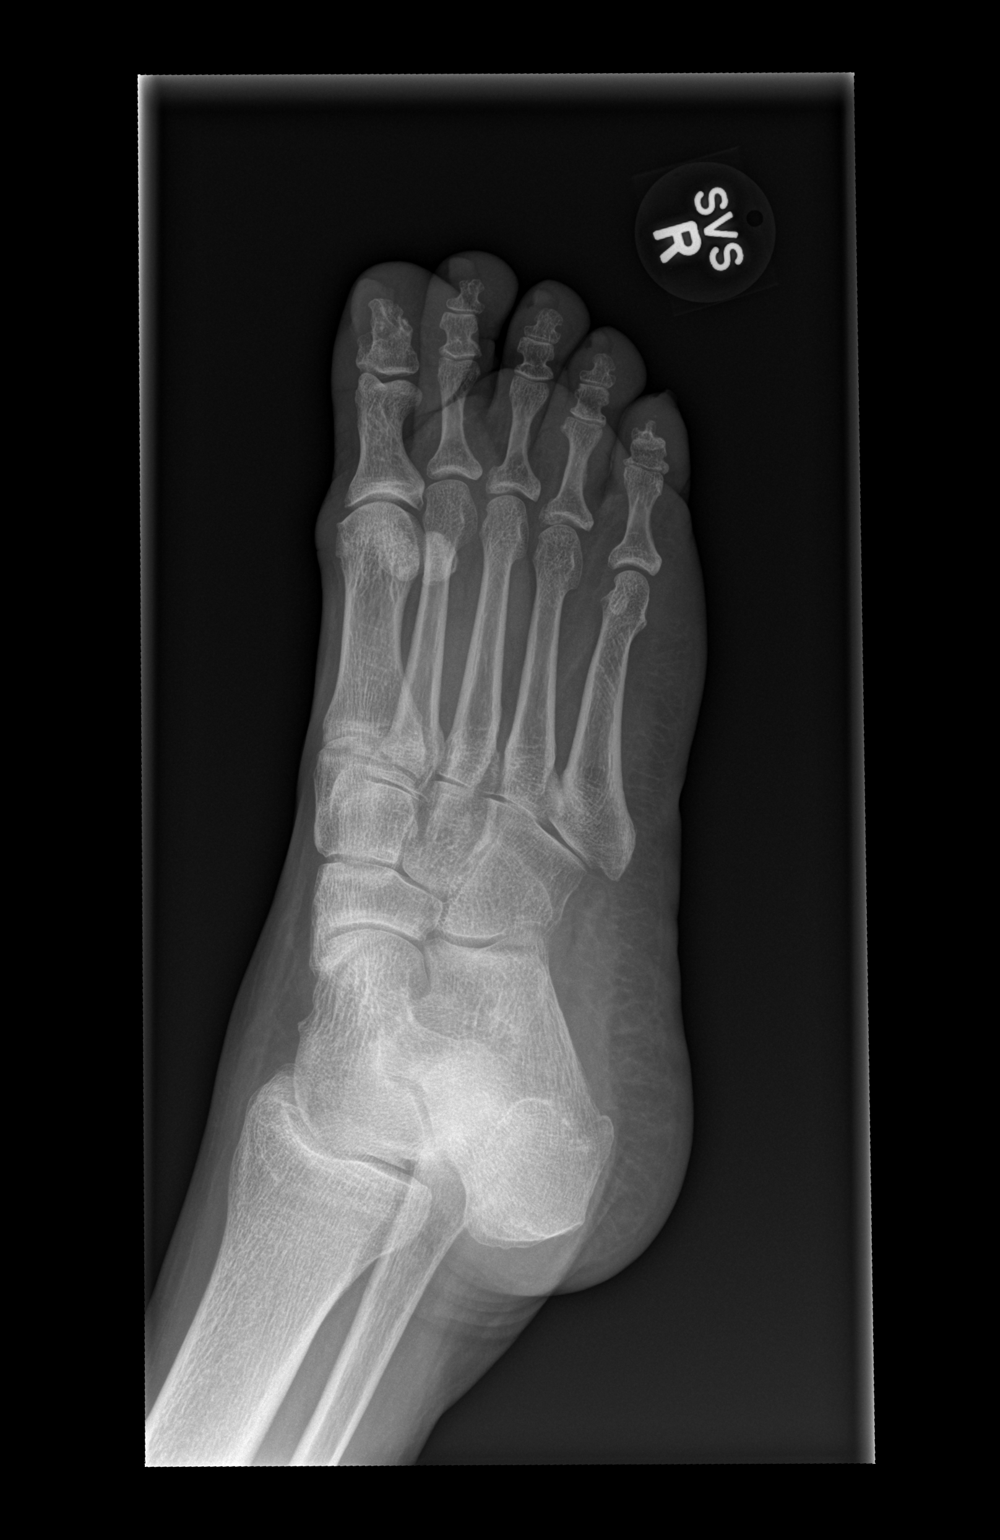

[x foot lat right]
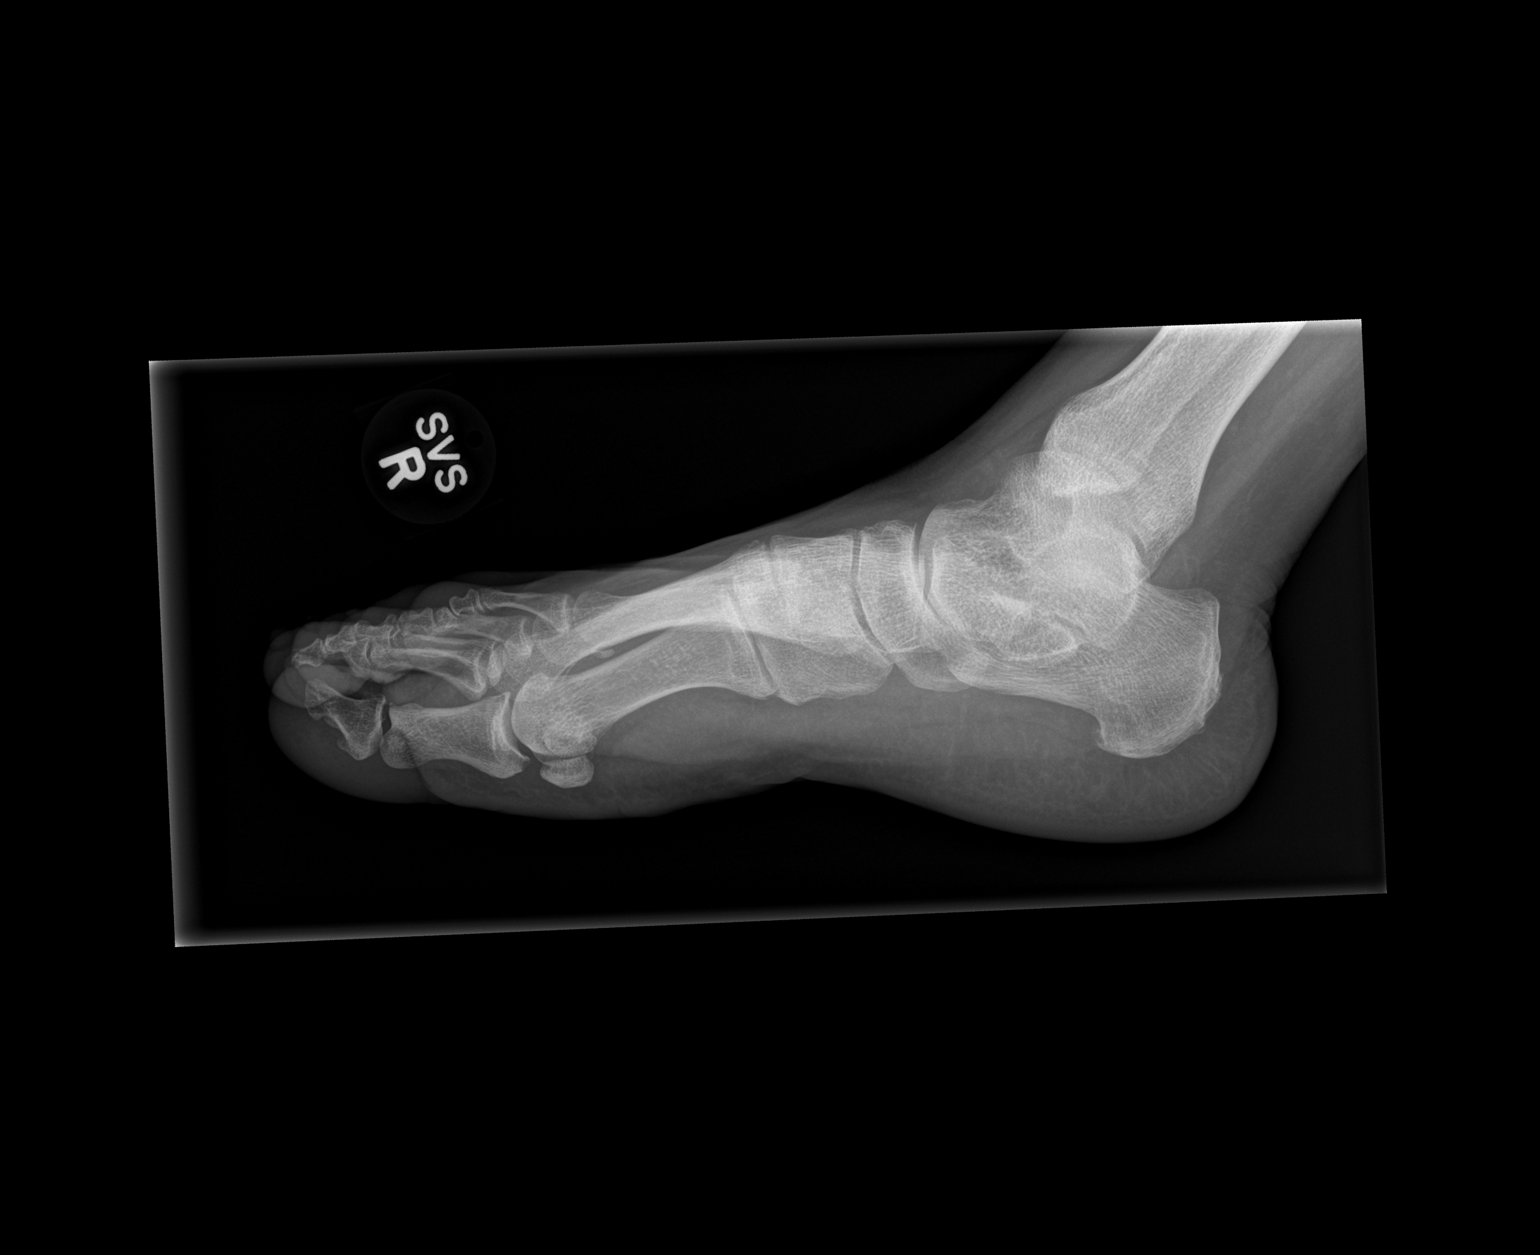

[3 of 3 positions shown; findings below may reference images not displayed]

FINDINGS: No evidence acute fracture or dislocation.

Mild first MTP degenerative joint narrowing and spurring.

No acute soft tissue findings.
IMPRESSION: No acute osseous findings.

## 2016-01-31 IMAGING — CR DG CHEST 2V
2 series · 2 of 2 positions shown · non-contrast
Comparison: 09/30/2014

CLINICAL DATA: Intermittent dizziness since yesterday, central
chest pain, jaw pain, chest pain feels like something is sitting on
her chest, diaphoresis today, headache behind LEFT eye, asthma,
hypertension, smoker

EXAM:
CHEST  2 VIEW

[w chest pa]
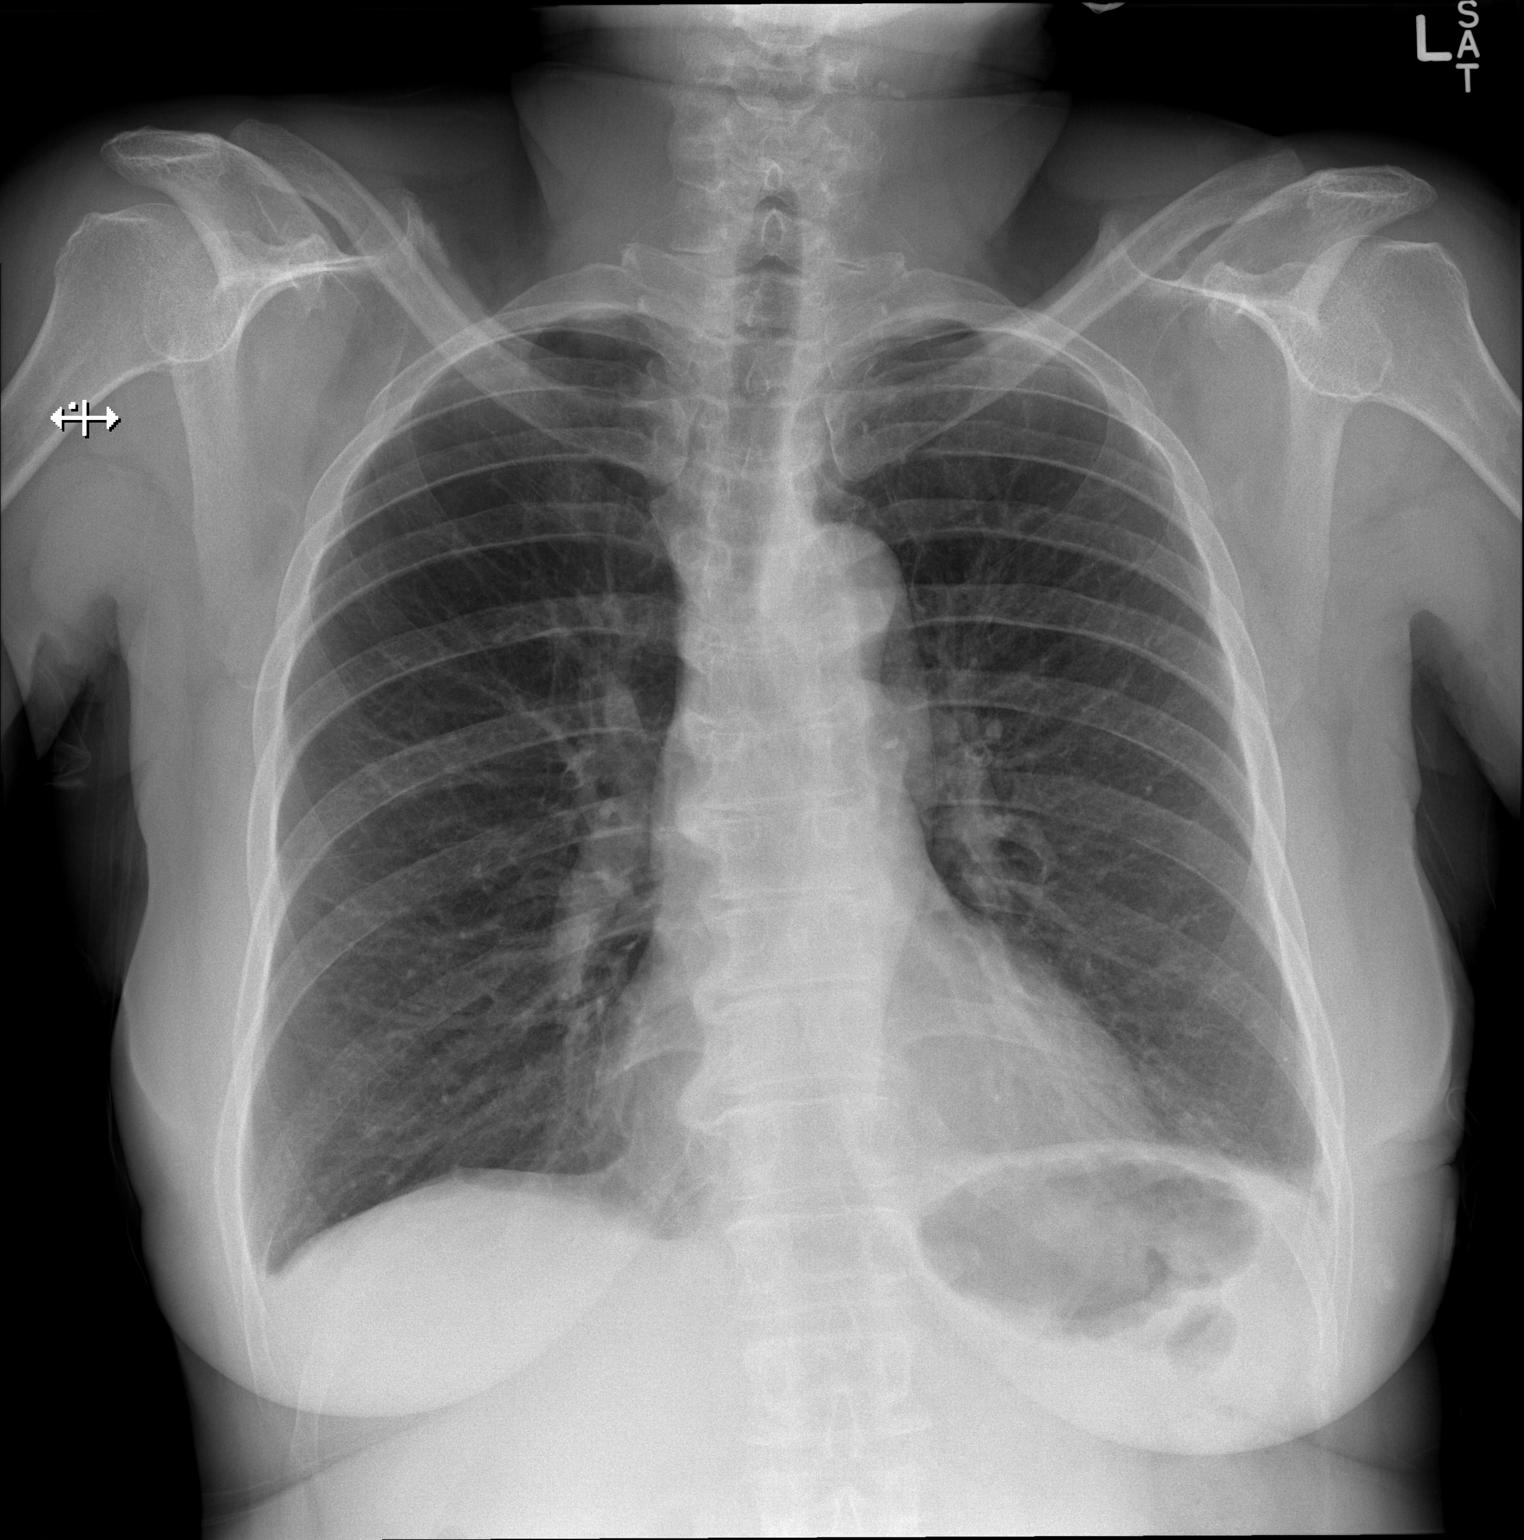

[w chest lat]
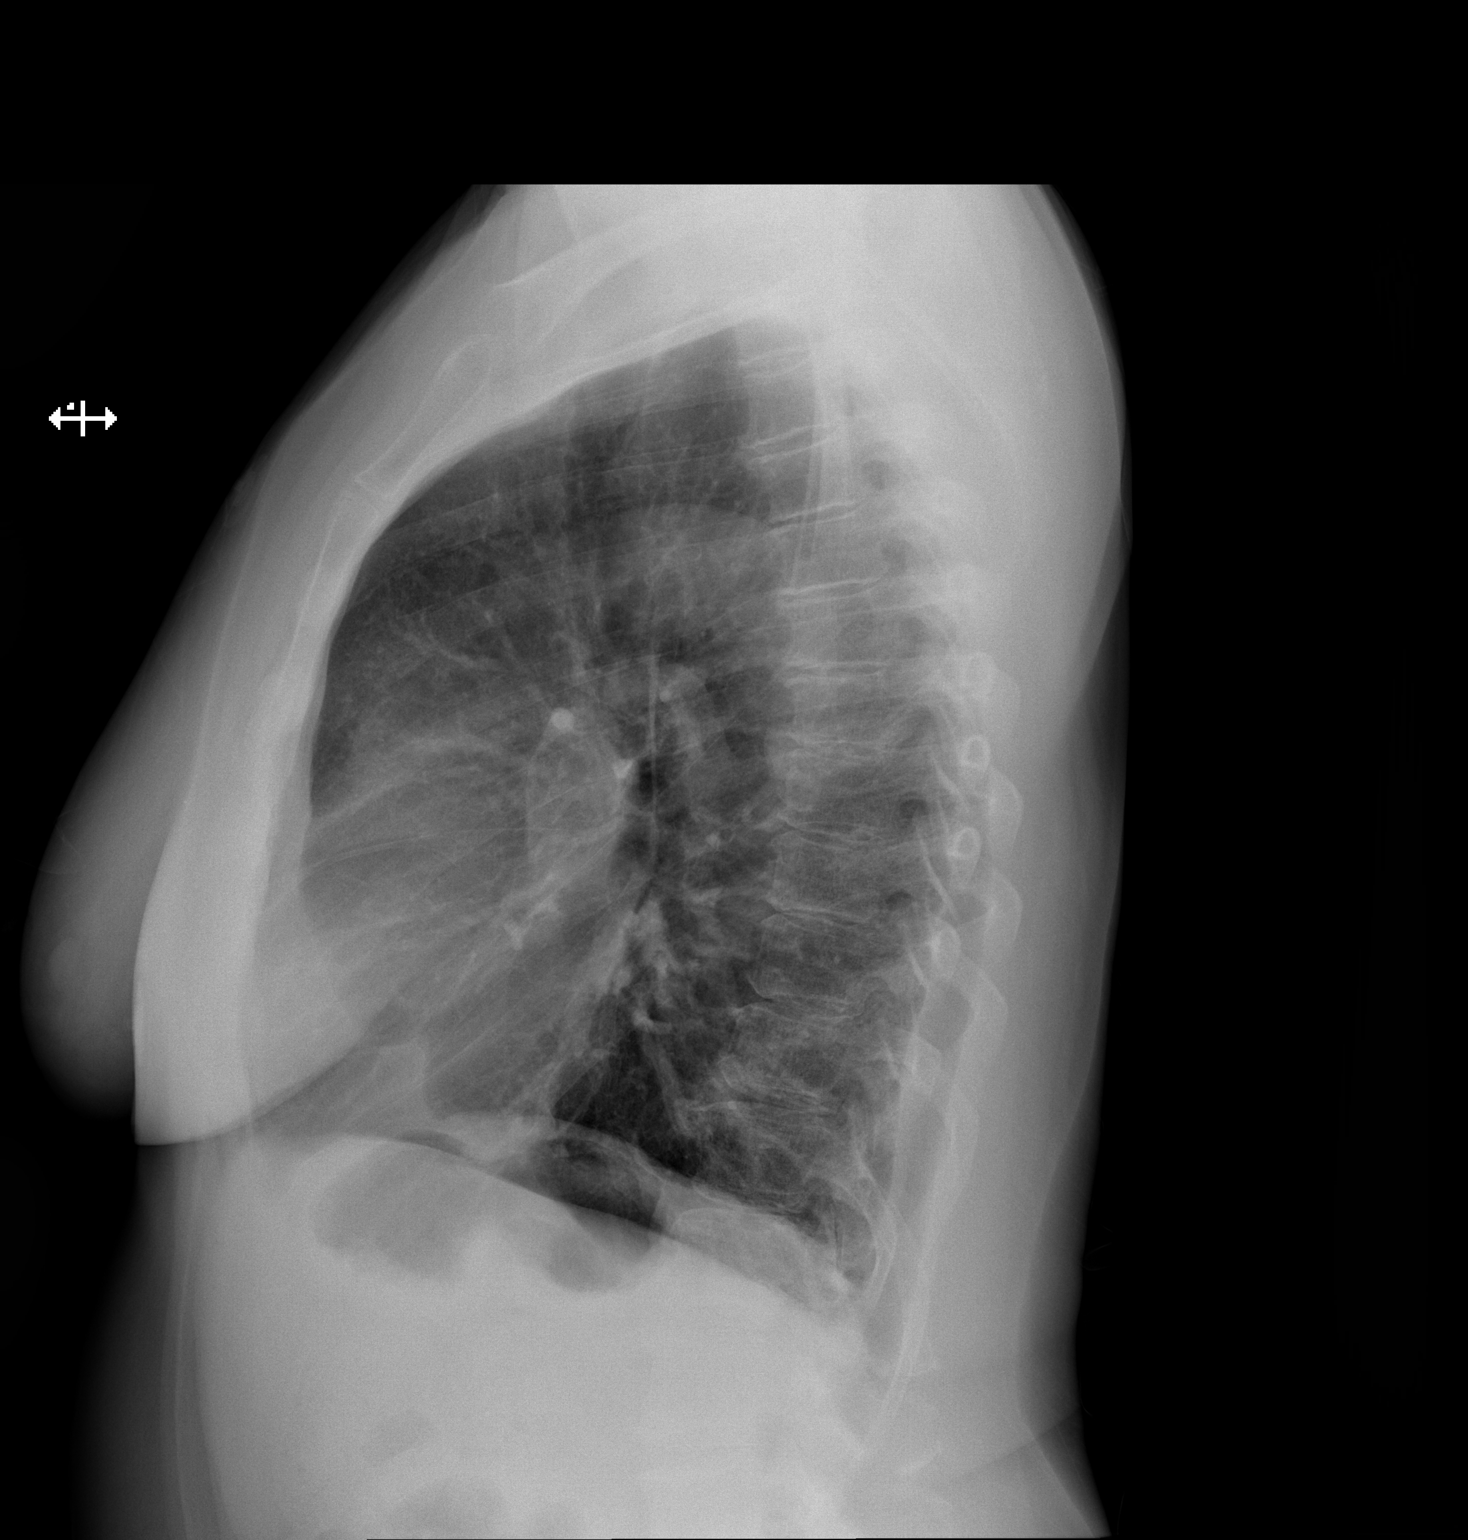

[2 of 2 positions shown; findings below may reference images not displayed]

FINDINGS: Normal heart size and pulmonary vascularity.

Mildly tortuous thoracic aorta.

Lungs appear mildly hyperinflated with central peribronchial
thickening.

No acute infiltrate, pleural effusion or pneumothorax.

Degenerative disc disease changes and levoconvex scoliosis of
thoracolumbar spine.
IMPRESSION: Question mild emphysematous changes.

No acute abnormalities.

## 2016-01-31 NOTE — Progress Notes (Deleted)
Electrophysiology Office Note   Date:  01/31/2016   ID:  [name], DOB 10-08-1955, MRN [id number]  PCP:  Pcp Not In System  Cardiologist:  Anh Mangano Jorja Loa, MD    No chief complaint on file.    History of Present Illness: [name] is a 60 y.o. female who presents today for electrophysiology evaluation.   Presented to the hospital with chest pain.  Stress testing done which was low risk.  Was discharged on enalapril for HTN.   Today, she denies*** symptoms of palpitations, chest pain, shortness of breath, orthopnea, PND, lower extremity edema, claudication, dizziness, presyncope, syncope, bleeding, or neurologic sequela. The patient is tolerating medications without difficulties and is otherwise without complaint today.    Past Medical History:  Diagnosis Date  . Arthritis   . Asthma   . Chest pain 2006  . Chronic back pain   . Hypertension   . Shortness of breath    due to  smoking    Past Surgical History:  Procedure Laterality Date  . ABDOMINAL HYSTERECTOMY  1997  . CARDIAC CATHETERIZATION  2006  . HEMI-MICRODISCECTOMY LUMBAR LAMINECTOMY LEVEL 1  05/14/2012   Procedure: HEMI-MICRODISCECTOMY LUMBAR LAMINECTOMY LEVEL 1;  Surgeon: Jacki Cones, MD;  Location: [facility];  Service: Orthopedics;  Laterality: N/A;  Hemi Laminectomy Microdiscectomy L4 - L5 Central (X-Ray)  . ingrown toenail removal    . TONSILLECTOMY    . TOTAL HIP ARTHROPLASTY Left 11/05/2013   Procedure: LEFT TOTAL HIP ARTHROPLASTY;  Surgeon: Jacki Cones, MD;  Location: [facility];  Service: Orthopedics;  Laterality: Left;     Current Outpatient Prescriptions  Medication Sig Dispense Refill  . albuterol (PROVENTIL HFA;VENTOLIN HFA) 108 (90 BASE) MCG/ACT inhaler Inhale 2 puffs into the lungs every 4 (four) hours as needed for wheezing or shortness of breath. 1 Inhaler 3  . aspirin 81 MG chewable tablet Chew 1 tablet (81 mg total) by mouth daily. 30 tablet 0  . diphenhydrAMINE (BENADRYL) 25  MG tablet Take 1 tablet (25 mg total) by mouth every 6 (six) hours. 20 tablet 0  . enalapril (VASOTEC) 10 MG tablet Take 1 tablet (10 mg total) by mouth daily. 30 tablet 2  . famotidine (PEPCID) 20 MG tablet Take 1 tablet (20 mg total) by mouth 2 (two) times daily. 10 tablet 0  . Multiple Vitamin (MULTIVITAMIN WITH MINERALS) TABS tablet Take 1 tablet by mouth daily.    . nicotine (NICODERM CQ - DOSED IN MG/24 HOURS) 14 mg/24hr patch Place 14 mg onto the skin daily.    Marland Kitchen omeprazole (PRILOSEC) 20 MG capsule Take 1 capsule (20 mg total) by mouth daily. 30 capsule 0  . oxyCODONE-acetaminophen (PERCOCET) 10-325 MG tablet Take 1 tablet by mouth every 4 (four) hours as needed for pain.    . potassium chloride SA (K-DUR,KLOR-CON) 20 MEQ tablet Take 1 tablet (20 mEq total) by mouth 2 (two) times daily. For 10days then take qd. 30 tablet 1  . zolpidem (AMBIEN) 10 MG tablet Take 10 mg by mouth at bedtime as needed for sleep.     No current facility-administered medications for this visit.     Allergies:   Review of patient's allergies indicates no known allergies.   Social History:  The patient  reports that she has been smoking Cigarettes.  She has a 15.00 pack-year smoking history. She has never used smokeless tobacco. She reports that she does not drink alcohol or use drugs.   Family History:  The patient's family history includes Diabetes type II in her sister; Heart attack (age of onset: 70) in her mother; Heart attack (age of onset: 40) in her sister; Hypertension in her sister and sister; Lung cancer in her sister; Tongue cancer in her brother.    ROS:  Please see the history of present illness.   Otherwise, review of systems is positive for ***.   All other systems are reviewed and negative.    PHYSICAL EXAM: VS:  There were no vitals taken for this visit. , BMI There is no height or weight on file to calculate BMI. GEN: Well nourished, well developed, in no acute distress  HEENT: normal    Neck: no JVD, carotid bruits, or masses Cardiac: ***RRR; no murmurs, rubs, or gallops,no edema  Respiratory:  clear to auscultation bilaterally, normal work of breathing GI: soft, nontender, nondistended, + BS MS: no deformity or atrophy  Skin: warm and dry, Neuro:  Strength and sensation are intact Psych: euthymic mood, full affect  EKG:  EKG {ACTION; IS/IS [id number]} ordered today. Personal review of the ekg ordered shows ***  Recent Labs: 06/08/2015: ALT 10 01/07/2016: Magnesium 2.0 01/08/2016: BUN 12; Creatinine, Ser 0.80; Hemoglobin 11.8; Platelets 313; Potassium 3.7; Sodium 138    Lipid Panel     Component Value Date/Time   CHOL  12/14/2009 0610    189        ATP III CLASSIFICATION:  <200     mg/dL   Desirable  784-696  mg/dL   Borderline High  >=295    mg/dL   High          TRIG 284 12/14/2009 0610   HDL 51 12/14/2009 0610   CHOLHDL 3.7 12/14/2009 0610   VLDL 28 12/14/2009 0610   LDLCALC (H) 12/14/2009 0610    110        Total Cholesterol/HDL:CHD Risk Coronary Heart Disease Risk Table                     Men   Women  1/2 Average Risk   3.4   3.3  Average Risk       5.0   4.4  2 X Average Risk   9.6   7.1  3 X Average Risk  23.4   11.0        Use the calculated Patient Ratio above and the CHD Risk Table to determine the patient's CHD Risk.        ATP III CLASSIFICATION (LDL):  <100     mg/dL   Optimal  132-440  mg/dL   Near or Above                    Optimal  130-159  mg/dL   Borderline  102-725  mg/dL   High  >366     mg/dL   Very High     Wt Readings from Last 3 Encounters:  01/07/16 140 lb (63.5 kg)  11/27/15 145 lb (65.8 kg)  11/26/15 145 lb (65.8 kg)      Other studies Reviewed: Additional studies/ records that were reviewed today include: Myoview 12/29/15  Review of the above records today demonstrates:  IMPRESSION: 1. No reversible ischemia or infarction.  2. Normal left ventricular wall motion.  3. Left ventricular ejection  fraction 68%  4. Non invasive risk stratification*: Low risk.   ASSESSMENT AND PLAN:  1.  Hypertension:  2. Chest pain:    Current medicines are reviewed  at length with the patient today.   The patient {ACTIONS; HAS/DOES NOT HAVE:19233} concerns regarding her medicines.  The following changes were made today:  {NONE DEFAULTED:18576::"none"}  Labs/ tests ordered today include: *** No orders of the defined types were placed in this encounter.    Disposition:   FU with *** {gen number [id number]}0-10:[id number]} {Days to years:10300}  Signed, Tamarick Kovalcik Jorja LoaMartin Lyncoln Ledgerwood, MD  01/31/2016 9:13 PM     [facility] [address] [place] [address] [phone] (office) [phone] (fax)

## 2016-02-01 ENCOUNTER — Encounter: Payer: Managed Care, Other (non HMO) | Admitting: Cardiology

## 2016-02-02 ENCOUNTER — Encounter: Payer: Self-pay | Admitting: Cardiology

## 2016-02-28 ENCOUNTER — Encounter (HOSPITAL_COMMUNITY): Payer: Self-pay | Admitting: Emergency Medicine

## 2016-02-28 ENCOUNTER — Ambulatory Visit (HOSPITAL_COMMUNITY)
Admission: EM | Admit: 2016-02-28 | Discharge: 2016-02-28 | Disposition: A | Discharge status: Home / Self Care | Payer: Managed Care, Other (non HMO) | Attending: Family Medicine | Admitting: Family Medicine

## 2016-02-28 DIAGNOSIS — J45901 Unspecified asthma with (acute) exacerbation: Secondary | ICD-10-CM | POA: Diagnosis not present

## 2016-02-28 DIAGNOSIS — J441 Chronic obstructive pulmonary disease with (acute) exacerbation: Secondary | ICD-10-CM | POA: Diagnosis not present

## 2016-02-28 MED ORDER — IPRATROPIUM-ALBUTEROL 0.5-2.5 (3) MG/3ML IN SOLN
RESPIRATORY_TRACT | Status: AC
  Filled 2016-02-28: qty 3

## 2016-02-28 MED ORDER — IPRATROPIUM-ALBUTEROL 0.5-2.5 (3) MG/3ML IN SOLN
3.0000 mL | Freq: Once | RESPIRATORY_TRACT | Status: AC
  Administered 2016-02-28: 3 mL via RESPIRATORY_TRACT

## 2016-02-28 MED ORDER — PREDNISONE 10 MG PO TABS: 20.0000 mg | ORAL_TABLET | Freq: Every day | ORAL | 0 refills | Status: DC

## 2016-02-28 MED ORDER — ALBUTEROL SULFATE HFA 108 (90 BASE) MCG/ACT IN AERS: 2.0000 | INHALATION_SPRAY | RESPIRATORY_TRACT | 11 refills | Status: DC | PRN

## 2016-02-28 NOTE — ED Triage Notes (Addendum)
[facility] a [facility] short[facility] of breath and exacerbation of her asthma. The patient stated that it has been ongoing for 3 days and she no longer has a rescue inhaler. The patient had obvious labored breathing and expository wheezing throughout bilaterally.

## 2016-02-28 NOTE — ED Provider Notes (Signed)
CSN: [id number]     Arrival date & time 02/28/16  1225 History   None    Chief Complaint  Patient presents with  . Shortness of Breath   (Consider location/radiation/quality/duration/timing/severity/associated sxs/prior Treatment) HPI 60 Y/O FEMALE WITH HX OF ASTHMA. SMOKER FOR OVER 30 YEARS, EX ALCOHOL DRINKER PRESENTS WITH WHEEZING THAT HAS BEEN PRESENT FOR ABOUT 1 MONTH. STATES SHE IS OUT OF HER INHALER. DOES NOT HAVE A PCP. HAS CUT SMOKING DOWN TO 1/2 PACK A DAY. COUGH WITHOUT SPUTUM. NO PAIN Past Medical History:  Diagnosis Date  . Arthritis   . Asthma   . Chest pain 2006  . Chronic back pain   . Hypertension   . Shortness of breath    due to  smoking    Past Surgical History:  Procedure Laterality Date  . ABDOMINAL HYSTERECTOMY  1997  . CARDIAC CATHETERIZATION  2006  . HEMI-MICRODISCECTOMY LUMBAR LAMINECTOMY LEVEL 1  05/14/2012   Procedure: HEMI-MICRODISCECTOMY LUMBAR LAMINECTOMY LEVEL 1;  Surgeon: Jacki Conesonald A Gioffre, MD;  Location: [facility];  Service: Orthopedics;  Laterality: N/A;  Hemi Laminectomy Microdiscectomy L4 - L5 Central (X-Ray)  . ingrown toenail removal    . TONSILLECTOMY    . TOTAL HIP ARTHROPLASTY Left 11/05/2013   Procedure: LEFT TOTAL HIP ARTHROPLASTY;  Surgeon: Jacki Conesonald A Gioffre, MD;  Location: [facility];  Service: Orthopedics;  Laterality: Left;   Family History  Problem Relation Age of Onset  . Heart attack Mother 2257  . Hypertension Sister   . Diabetes type II Sister   . Tongue cancer Brother   . Heart attack Sister 6365  . Lung cancer Sister   . Hypertension Sister    Social History  Substance Use Topics  . Smoking status: Current Every Day Smoker    Packs/day: 0.50    Years: 30.00    Types: Cigarettes  . Smokeless tobacco: Never Used     Comment: Smoking since teenage years  . Alcohol use No   OB History    Gravida Para Term Preterm AB Living   3 3           SAB TAB Ectopic Multiple Live Births                 Review of Systems  Denies:  HEADACHE, NAUSEA, ABDOMINAL PAIN, CHEST PAIN, CONGESTION, DYSURIA, SHORTNESS OF BREATH  Allergies  Review of patient's allergies indicates no known allergies.  Home Medications   Prior to Admission medications   Medication Sig Start Date End Date Taking? Authorizing Provider  enalapril (VASOTEC) 10 MG tablet Take 1 tablet (10 mg total) by mouth daily. 01/08/16  Yes Carolynn CommentBryan Strelow, MD  albuterol (PROVENTIL HFA;VENTOLIN HFA) 108 (90 BASE) MCG/ACT inhaler Inhale 2 puffs into the lungs every 4 (four) hours as needed for wheezing or shortness of breath. 09/25/14   Eber HongBrian Miller, MD  albuterol (PROVENTIL HFA;VENTOLIN HFA) 108 (90 Base) MCG/ACT inhaler Inhale 2 puffs into the lungs every 4 (four) hours as needed for wheezing or shortness of breath. 02/28/16   Tharon AquasFrank C Patrick, PA  aspirin 81 MG chewable tablet Chew 1 tablet (81 mg total) by mouth daily. 06/08/15   Arby BarretteMarcy Pfeiffer, MD  diphenhydrAMINE (BENADRYL) 25 MG tablet Take 1 tablet (25 mg total) by mouth every 6 (six) hours. 11/27/15   Bethel BornKelly Marie Gekas, PA-C  famotidine (PEPCID) 20 MG tablet Take 1 tablet (20 mg total) by mouth 2 (two) times daily. 11/27/15   Bethel BornKelly Marie Gekas, PA-C  Multiple Vitamin (  MULTIVITAMIN WITH MINERALS) TABS tablet Take 1 tablet by mouth daily.    Historical Provider, MD  nicotine (NICODERM CQ - DOSED IN MG/24 HOURS) 14 mg/24hr patch Place 14 mg onto the skin daily.    Historical Provider, MD  omeprazole (PRILOSEC) 20 MG capsule Take 1 capsule (20 mg total) by mouth daily. 06/08/15   Arby Barrette, MD  oxyCODONE-acetaminophen (PERCOCET) 10-325 MG tablet Take 1 tablet by mouth every 4 (four) hours as needed for pain.    Historical Provider, MD  potassium chloride SA (K-DUR,KLOR-CON) 20 MEQ tablet Take 1 tablet (20 mEq total) by mouth 2 (two) times daily. For 10days then take qd. 12/04/15   Linna Hoff, MD  predniSONE (DELTASONE) 10 MG tablet Take 2 tablets (20 mg total) by mouth daily. 02/28/16   Tharon Aquas, PA  zolpidem  (AMBIEN) 10 MG tablet Take 10 mg by mouth at bedtime as needed for sleep.    Historical Provider, MD   Meds Ordered and Administered this Visit   Medications  ipratropium-albuterol (DUONEB) 0.5-2.5 (3) MG/3ML nebulizer solution 3 mL (3 mLs Nebulization Given 02/28/16 1258)    BP 126/78 (BP Location: Left Arm)   Pulse 66   Temp 98 F (36.7 C) (Oral)   Resp 18   SpO2 100%  No data found.   Physical Exam NURSES NOTES AND VITAL SIGNS REVIEWED. CONSTITUTIONAL: Well developed, well nourished, no acute distress HEENT: normocephalic, atraumatic EYES: Conjunctiva normal NECK:normal ROM, supple, no adenopathy PULMONARY:No respiratory distress, normal effort, DIFFUSE WHEEZING THROUGHOUT LUNG FIELDS. NO CONSOLIDATION.  ABDOMINAL: Soft, ND, NT BS+, No CVAT MUSCULOSKELETAL: Normal ROM of all extremities,  SKIN: warm and dry without rash PSYCHIATRIC: Mood and affect, behavior are normal  Urgent Care Course   Clinical Course  NEB TX PROVIDED AND PATIENT STATES SHE IS FEELING HER BEST IN A LONG.   Procedures (including critical care time)  Labs Review Labs Reviewed - No data to display  Imaging Review No results found.   Visual Acuity Review  Right Eye Distance:   Left Eye Distance:   Bilateral Distance:    Right Eye Near:   Left Eye Near:    Bilateral Near:         MDM   1. COPD exacerbation (HCC)   2. Moderate asthma with exacerbation, unspecified whether persistent     Patient is reassured that there are no issues that require transfer to higher level of care at this time or additional tests. Patient is advised to continue home symptomatic treatment. Patient is advised that if there are new or worsening symptoms to attend the emergency department, contact primary care provider, or return to UC. Instructions of care provided discharged home in stable condition.    THIS NOTE WAS GENERATED USING A VOICE RECOGNITION SOFTWARE PROGRAM. ALL REASONABLE EFFORTS  WERE MADE  TO PROOFREAD THIS DOCUMENT FOR ACCURACY.  I have verbally reviewed the discharge instructions with the patient. A printed AVS was given to the patient.  All questions were answered prior to discharge.      Tharon Aquas, PA 02/28/16 1505

## 2016-05-04 ENCOUNTER — Ambulatory Visit (HOSPITAL_COMMUNITY)
Admission: EM | Admit: 2016-05-04 | Discharge: 2016-05-04 | Disposition: A | Discharge status: Home / Self Care | Payer: Managed Care, Other (non HMO) | Attending: Family Medicine | Admitting: Family Medicine

## 2016-05-04 ENCOUNTER — Ambulatory Visit (INDEPENDENT_AMBULATORY_CARE_PROVIDER_SITE_OTHER): Payer: Managed Care, Other (non HMO)

## 2016-05-04 ENCOUNTER — Encounter (HOSPITAL_COMMUNITY): Payer: Self-pay | Admitting: Emergency Medicine

## 2016-05-04 DIAGNOSIS — F17200 Nicotine dependence, unspecified, uncomplicated: Secondary | ICD-10-CM | POA: Diagnosis not present

## 2016-05-04 DIAGNOSIS — J069 Acute upper respiratory infection, unspecified: Secondary | ICD-10-CM

## 2016-05-04 DIAGNOSIS — B9789 Other viral agents as the cause of diseases classified elsewhere: Secondary | ICD-10-CM | POA: Diagnosis not present

## 2016-05-04 DIAGNOSIS — J439 Emphysema, unspecified: Secondary | ICD-10-CM

## 2016-05-04 MED ORDER — PREDNISONE 20 MG PO TABS: ORAL_TABLET | ORAL | 0 refills | Status: DC

## 2016-05-04 MED ORDER — ALBUTEROL SULFATE (5 MG/ML) 0.5% IN NEBU: 2.5000 mg | INHALATION_SOLUTION | Freq: Four times a day (QID) | RESPIRATORY_TRACT | 12 refills | Status: DC | PRN

## 2016-05-04 MED ORDER — AZITHROMYCIN 250 MG PO TABS: 250.0000 mg | ORAL_TABLET | Freq: Every day | ORAL | 0 refills | Status: DC

## 2016-05-04 NOTE — ED Provider Notes (Signed)
CSN: [id number]     Arrival date & time 05/04/16  1200 History   First MD Initiated Contact with Patient 05/04/16 1249     Chief Complaint  Patient presents with  . URI   (Consider location/radiation/quality/duration/timing/severity/associated sxs/prior Treatment) HPI Fransisca KaufmannRenee E Whirley is a 60 y.o. female presenting to UC with c/o gradually worsening cold-like symptoms with cough, congestion, mild chest soreness, SOB, and wheeze for about 1 week.  She has a hx of asthma and unsure if COPD.  She smokes 5 cigarettes per day.  Denies fever, chills, n/v/d. No sick contacts or recent travel. She has been using her albuterol inhaler with mild relief. She does not have a PCP but is requesting to be prescribed a nebulizer machine as she feels that works better for her than the inhaler.    Past Medical History:  Diagnosis Date  . Arthritis   . Asthma   . Chest pain 2006  . Chronic back pain   . Hypertension   . Shortness of breath    due to  smoking    Past Surgical History:  Procedure Laterality Date  . ABDOMINAL HYSTERECTOMY  1997  . CARDIAC CATHETERIZATION  2006  . HEMI-MICRODISCECTOMY LUMBAR LAMINECTOMY LEVEL 1  05/14/2012   Procedure: HEMI-MICRODISCECTOMY LUMBAR LAMINECTOMY LEVEL 1;  Surgeon: Jacki Conesonald A Gioffre, MD;  Location: [facility];  Service: Orthopedics;  Laterality: N/A;  Hemi Laminectomy Microdiscectomy L4 - L5 Central (X-Ray)  . ingrown toenail removal    . TONSILLECTOMY    . TOTAL HIP ARTHROPLASTY Left 11/05/2013   Procedure: LEFT TOTAL HIP ARTHROPLASTY;  Surgeon: Jacki Conesonald A Gioffre, MD;  Location: [facility];  Service: Orthopedics;  Laterality: Left;   Family History  Problem Relation Age of Onset  . Heart attack Mother 8257  . Hypertension Sister   . Diabetes type II Sister   . Tongue cancer Brother   . Heart attack Sister 3965  . Lung cancer Sister   . Hypertension Sister    Social History  Substance Use Topics  . Smoking status: Current Every Day Smoker    Packs/day: 0.50   Years: 30.00    Types: Cigarettes  . Smokeless tobacco: Never Used     Comment: Smoking since teenage years  . Alcohol use No   OB History    Gravida Para Term Preterm AB Living   3 3           SAB TAB Ectopic Multiple Live Births                 Review of Systems  Constitutional: Negative for chills and fever.  HENT: Positive for congestion, postnasal drip, rhinorrhea and sore throat. Negative for ear pain, sinus pain, sinus pressure, trouble swallowing and voice change.   Respiratory: Positive for cough, chest tightness, shortness of breath and wheezing.   Cardiovascular: Positive for chest pain ( soreness). Negative for palpitations.  Gastrointestinal: Negative for abdominal pain, diarrhea, nausea and vomiting.  Musculoskeletal: Negative for arthralgias, back pain and myalgias.  Skin: Negative for rash.  Neurological: Negative for dizziness, light-headedness and headaches.    Allergies  Patient has no known allergies.  Home Medications   Prior to Admission medications   Medication Sig Start Date End Date Taking? Authorizing Provider  albuterol (PROVENTIL HFA;VENTOLIN HFA) 108 (90 BASE) MCG/ACT inhaler Inhale 2 puffs into the lungs every 4 (four) hours as needed for wheezing or shortness of breath. 09/25/14  Yes Eber HongBrian Miller, MD  albuterol (PROVENTIL HFA;VENTOLIN HFA) 108 (  90 Base) MCG/ACT inhaler Inhale 2 puffs into the lungs every 4 (four) hours as needed for wheezing or shortness of breath. 02/28/16  Yes Tharon Aquas, PA  enalapril (VASOTEC) 10 MG tablet Take 1 tablet (10 mg total) by mouth daily. 01/08/16  Yes Carolynn Comment, MD  Multiple Vitamin (MULTIVITAMIN WITH MINERALS) TABS tablet Take 1 tablet by mouth daily.   Yes Historical Provider, MD  omeprazole (PRILOSEC) 20 MG capsule Take 1 capsule (20 mg total) by mouth daily. 06/08/15  Yes Arby Barrette, MD  oxyCODONE-acetaminophen (PERCOCET) 10-325 MG tablet Take 1 tablet by mouth every 4 (four) hours as needed for pain.    Yes Historical Provider, MD  potassium chloride SA (K-DUR,KLOR-CON) 20 MEQ tablet Take 1 tablet (20 mEq total) by mouth 2 (two) times daily. For 10days then take qd. 12/04/15  Yes Linna Hoff, MD  zolpidem (AMBIEN) 10 MG tablet Take 10 mg by mouth at bedtime as needed for sleep.   Yes Historical Provider, MD  albuterol (PROVENTIL) (5 MG/ML) 0.5% nebulizer solution Take 0.5 mLs (2.5 mg total) by nebulization every 6 (six) hours as needed for wheezing or shortness of breath. 05/04/16   Junius Finner, PA-C  aspirin 81 MG chewable tablet Chew 1 tablet (81 mg total) by mouth daily. 06/08/15   Arby Barrette, MD  azithromycin (ZITHROMAX) 250 MG tablet Take 1 tablet (250 mg total) by mouth daily. Take first 2 tablets together, then 1 every day until finished. 05/04/16   Junius Finner, PA-C  diphenhydrAMINE (BENADRYL) 25 MG tablet Take 1 tablet (25 mg total) by mouth every 6 (six) hours. 11/27/15   Bethel Born, PA-C  famotidine (PEPCID) 20 MG tablet Take 1 tablet (20 mg total) by mouth 2 (two) times daily. 11/27/15   Bethel Born, PA-C  nicotine (NICODERM CQ - DOSED IN MG/24 HOURS) 14 mg/24hr patch Place 14 mg onto the skin daily.    Historical Provider, MD  predniSONE (DELTASONE) 10 MG tablet Take 2 tablets (20 mg total) by mouth daily. 02/28/16   Tharon Aquas, PA  predniSONE (DELTASONE) 20 MG tablet 3 tabs po day one, then 2 po daily x 4 days 05/04/16   Junius Finner, PA-C   Meds Ordered and Administered this Visit  Medications - No data to display  BP 148/78 (BP Location: Left Arm)   Pulse 60   Temp 97.7 F (36.5 C) (Oral)   Resp 14   SpO2 100%  No data found.   Physical Exam  Constitutional: She appears well-developed and well-nourished. No distress.  HENT:  Head: Normocephalic and atraumatic.  Right Ear: Tympanic membrane is injected. Tympanic membrane is not erythematous and not bulging.  Left Ear: Tympanic membrane normal.  Nose: Nose normal. Right sinus exhibits no maxillary sinus  tenderness and no frontal sinus tenderness. Left sinus exhibits no maxillary sinus tenderness and no frontal sinus tenderness.  Mouth/Throat: Uvula is midline, oropharynx is clear and moist and mucous membranes are normal.  Eyes: Conjunctivae are normal. No scleral icterus.  Neck: Normal range of motion. Neck supple.  Cardiovascular: Normal rate, regular rhythm and normal heart sounds.   Pulmonary/Chest: Effort normal and breath sounds normal. No respiratory distress. She has no wheezes. She has no rales.  Abdominal: Soft. She exhibits no distension. There is no tenderness.  Musculoskeletal: Normal range of motion.  Neurological: She is alert.  Skin: Skin is warm and dry. She is not diaphoretic.  Nursing note and vitals reviewed.   Urgent Care  Course   Clinical Course     Procedures (including critical care time)  Labs Review Labs Reviewed - No data to display  Imaging Review Dg Chest 2 View  Result Date: 05/04/2016 CLINICAL DATA:  Shortness of breath and chest pain. EXAM: CHEST  2 VIEW COMPARISON:  Two-view chest x-ray 01/07/2016. FINDINGS: The heart size is normal. Emphysematous changes are again noted. Atherosclerotic calcifications are present at the aorta. Atelectasis or scarring is present at the lung bases. No other significant airspace consolidation is present. IMPRESSION: 1. Emphysema. 2. No acute cardiopulmonary disease. 3. Minimal bibasilar airspace disease likely reflects atelectasis. 4. Aortic atherosclerosis. Electronically Signed   By: Marin Robertshristopher  Mattern M.D.   On: 05/04/2016 13:14    MDM   1. Acute exacerbation of emphysema (HCC)   2. Viral URI with cough   3. Current smoker    Pt c/o 1 week of worsening URI symptoms including cough, congestion, chest tightness and wheeze.    O2 Sat is 100% on RA.  Lungs: CTAB, however, due to reported chest tightness and SOB, will get CXR to help r/o pneumonia.  CXR: evidence of emphysema w/o acute findings.  Rx:  Prednisone, one nebulizer machine hand written prescription, albuterol nebulizer solution, and prescription to hold with expiration date for Azithromycin.  Resource guide provided for PCP for ongoing healthcare needs including ongoing management of emphysema.     Junius Finnerrin O'Malley, PA-C 05/04/16 1340

## 2016-05-04 NOTE — Discharge Instructions (Signed)
Your symptoms are likely due to a virus such as the common cold, however, if you developing worsening chest congestion with shortness of breath, persistent fever for 3 days, or symptoms not improving in 4-5 days, you may fill the antibiotic (azithromycin).  If you do fill the antibiotic,  please take antibiotics as prescribed and be sure to complete entire course even if you start to feel better to ensure infection does not come back.  Emergency Department Resource Guide 1) Find a Doctor and Pay Out of Pocket Although you won't have to find out who is covered by your insurance plan, it is a good idea to ask around and get recommendations. You will then need to call the office and see if the doctor you have chosen will accept you as a new patient and what types of options they offer for patients who are self-pay. Some doctors offer discounts or will set up payment plans for their patients who do not have insurance, but you will need to ask so you aren't surprised when you get to your appointment.  2) Contact Your Local Health Department Not all health departments have doctors that can see patients for sick visits, but many do, so it is worth a call to see if yours does. If you don't know where your local health department is, you can check in your phone book. The CDC also has a tool to help you locate your state's health department, and many state websites also have listings of all of their local health departments.  3) Find a Walk-in Clinic If your illness is not likely to be very severe or complicated, you may want to try a walk in clinic. These are popping up all over the country in pharmacies, drugstores, and shopping centers. They're usually staffed by nurse practitioners or physician assistants that have been trained to treat common illnesses and complaints. They're usually fairly quick and inexpensive. However, if you have serious medical issues or chronic medical problems, these are probably not  your best option.  No Primary Care Doctor: Call Health Connect at  [phone] - they can help you locate a primary care doctor that  accepts your insurance, provides certain services, etc. Physician Referral Service- [phone]  Chronic Pain Problems: Organization         Address  Phone   Notes  [facility]  [phone] Patients need to be referred by their primary care doctor.   Medication Assistance: Organization         Address  Phone   Notes  [facility] Medication [facility] [address] [place] [place], [place] [address] [phone] --Must be a resident of [facility] -- Must have NO insurance coverage whatsoever (no Medicaid/ Medicare, etc.) -- The pt. MUST have a primary care doctor that directs their care regularly and follows them in the community   [facility]  [phone]   [employer]  [phone]    Agencies that provide inexpensive medical care: Buyer, retail  Notes  [facility]  [phone]   [facility]    [phone]   [facility] [address] [place], [place] [address] [phone]) [phone]   [facility] [address], [place] [phone]   [facility]    [phone]   [facility]    [phone](336) [phone]   [facility]  [address], [place] Phone:  [phone]) [phone], Fax:  [phone]) [phone] Hours of Operation:  9 am - 6 pm, M-F.  Also accepts Medicaid/Medicare and self-pay.  [facility]  [address], [place] Phone: [phone]) [phone], Fax: [phone]. Hours of Operation:  8:30 am - 5:30 pm, M-F.  Also accepts Medicaid and self-pay.  [facility] [place] [address], [place] Phone: [phone](336)  [phone]   [facility] [address], [place], [place] [phone], Ext. [phone] Mondays & Thursdays: 7-9 AM.  First 15 patients are seen on a first come, first serve basis.    Medicaid-accepting [facility] Providers:  Organization         Address                                                                       Phone                               Notes  [facility] [address], [place] [phone]) [phone] Also accepts self-pay patients.  [facility] [address], [place]  [phone]) [phone]   [facility] [address], [place] [phone]) [phone]   [facility] [address] [place] [address], [place] [phone]) [phone]   Renaye RakersVeita Bland [address], [place]   [phone] Only accepts [place] Access [place] patients after they have their name applied to their card.   Self-Pay (no insurance) in [facility]:   Organization         Address                                                     Phone               Notes  Sickle Cell Patients, [facility] [address], [place] [phone]) [phone]   [facility] [address], [place] [phone](336) [phone]   [facility] [place]  [address], [place] [phone]) [phone]   [facility]/Dr. Osei-Bonsu  [address] [place] [address], [place] or [phone], [place] [phone] Phone number for both [place] and [place] locations is the same.  [facility] [address], [place] [phone]) [phone]   [facility] [address] [place] [address], [place] or [address] [phone]   [facility] [address] [place] [phone], phone; [phone], fax Sees patients 1st and 3rd Saturday of every month.  Must not qualify for public or private insurance  (i.e. Medicaid, Medicare, [place] Health Choice, Veterans' Benefits)  Household income should be no more than 200% of the poverty level The clinic cannot treat you if you are pregnant or think you are pregnant  Sexually transmitted diseases are not treated at the clinic.    Dental Care: Organization         Address                                  Phone                       Notes  [facility] Department of [facility] [address] [place], [place] [phone] Accepts children up to age 52 who are enrolled in [place] or [place] Health Choice; pregnant women with a Medicaid card; and children who have applied for Medicaid or [place] Health Choice, but were declined, whose parents can pay a reduced fee at time of service.  [facility] Department of [facility]  [address], [place] [phone] Accepts children up to age 14 who are enrolled in [place] or [place] Health Choice; pregnant women with a Medicaid card; and children who have applied for Medicaid or [place] Health Choice, but were declined, whose parents can pay a reduced fee at time of service.  [facility]  [address] [place], [place] [phone] Patients are seen by appointment only. Walk-ins are not accepted. [facility] will see patients 9 years of age and older. Monday - Tuesday (8am-5pm) Most Wednesdays (8:30-5pm) $30 per visit, cash only  [facility]  [address], [facility] [phone] Patients are seen by appointment only. Walk-ins are not accepted. [facility] will see patients 4 years of age and older. One Wednesday Evening (Monthly: Volunteer Based).  $30 per visit, cash only  [employer]  [phone] for adults; Children under age 59, call Graduate Pediatric Dentistry at [phone]. Children aged 38-14, please call [phone] to request a pediatric application.  Dental services are  provided in all areas of dental care including fillings, crowns and bridges, complete and partial dentures, implants, gum treatment, root canals, and extractions. Preventive care is also provided. Treatment is provided to both adults and children. Patients are selected via a lottery and there is often a waiting list.   [facility] [address], [place]  [phone] [email or web address]   [facility] [address] [place], [place] [phone], Ext. [phone] Second and Fourth Thursday of each month, opens at 6:30 AM; Clinic ends at 9 AM.  Patients are seen on a first-come first-served basis, and a limited number are seen during each clinic.   [facility]  [address] [place], [place] [phone]   Eligibility Requirements You must have lived in [place], [place], or [place] counties for at least the last three months.   You cannot be eligible for state or federal sponsored [place], including [employer], [place], or [employer].   You generally cannot be eligible for healthcare insurance through your employer.    How to apply: Eligibility screenings are held every  Tuesday and Wednesday afternoon from 1:00 pm until 4:00 pm. You do not need an appointment for the interview!  [facility] [address], [place], [place] [phone]   [facility]  [phone]   [facility]  [phone]   [facility]  [phone]    Behavioral Health Resources in the Community: Intensive Outpatient Programs Organization         Address                                              Phone              Notes  [facility] [address], [place], [place] [phone]   [facility] [address], [place], [place] [phone]   [facility] [address], [place], [place]  [phone]   [facility] [address],  [place], [place] [phone] or [phone]   Substance Abuse Resources Organization         Address                                Phone  Notes  [facility]  [phone]   [facility]  [phone]   [facility]  [phone]   [facility]  [phone]   Residential & Outpatient Substance Abuse Program  [phone]   Psychological Services Organization         Address                                  Phone                Notes  [facility]  [phone]- [phone]   [facility]  [phone]- [phone]   [facility] [address], [place] [phone] or [phone]    Mobile Crisis Teams Organization         Address  Phone  Notes  [facility], Mobile Crisis Care Unit  [phone]   [facility]  [address] [place], [place] [phone]   Doristine LocksSharon DeEsch [address] [place] [phone]    Self-Help/Support Groups Organization         Address                         Phone             Notes  [facility] [place] - variety of support groups  336- [phone] Call for more information  Narcotics Anonymous (NA), [facility] [address], [place]  2 meetings at this location   Statisticianesidential Treatment Programs Organization         Address                                                    Phone              Notes  [facility] [phone]  [address]    [place]  [phone]   [facility]  [address] [place] [address], [place], [place] [phone]   [facility] [address] [place], [place] [phone] Admissions: 8am-3pm M-F  [facility] [address],    [place], [place] [phone]   [facility] [address] [place], [place], [place] [phone]   [facility] [address],  [place], [place]  [phone]   [facility] - Intensive Outpatient [address], [place], [place] [phone]   [facility] [address] [place].,  [place], [place] [phone] or [phone]   [facility] ([facility]) [address], [place], [place] [phone] Accepts Medicaid  [facility] [address],  [place] [phone] Substance Abuse/Addiction Treatment   [facility] Organization         Address                                                            Phone                    Notes  [facility]  [phone]   Angie Fava, PhD [address] [place], [place]   [phone] or [phone]   [facility] Behavioral   [address] [place], [place] [phone]   [facility] [address], [place], [place] [phone] Insurance/Medicaid/sponsorship through [facility] [address]                                    [place], [place] [phone] Therapy/tele-psych/case  [facility] [address], [place] [phone]    Dr. Lolly Mustache  [phone]   [facility]  [employer] [facility] Dept. 1) [address] [place] 2) [address], [place] 3)  [address], [place] [phone]  [phone]   [facility] Child Abuse Hotline [phone] or [phone] (After Hours)

## 2016-05-04 NOTE — ED Triage Notes (Signed)
Here for cold sx onset 1 week associated w/nasal congestion, prod cough, chest pain, SOB, wheezing  Using rescue inhaler w/no relief.   Smokes 5 cigs PPD  A&O X4... NAD

## 2016-05-10 ENCOUNTER — Encounter (HOSPITAL_COMMUNITY): Payer: Self-pay | Admitting: Emergency Medicine

## 2016-05-10 ENCOUNTER — Ambulatory Visit (HOSPITAL_COMMUNITY)
Admission: EM | Admit: 2016-05-10 | Discharge: 2016-05-10 | Disposition: A | Discharge status: Home / Self Care | Payer: Managed Care, Other (non HMO) | Attending: Family Medicine | Admitting: Family Medicine

## 2016-05-10 DIAGNOSIS — I10 Essential (primary) hypertension: Secondary | ICD-10-CM

## 2016-05-10 DIAGNOSIS — J441 Chronic obstructive pulmonary disease with (acute) exacerbation: Secondary | ICD-10-CM

## 2016-05-10 DIAGNOSIS — F17200 Nicotine dependence, unspecified, uncomplicated: Secondary | ICD-10-CM | POA: Diagnosis not present

## 2016-05-10 DIAGNOSIS — I5031 Acute diastolic (congestive) heart failure: Secondary | ICD-10-CM | POA: Diagnosis not present

## 2016-05-10 MED ORDER — IPRATROPIUM-ALBUTEROL 0.5-2.5 (3) MG/3ML IN SOLN
RESPIRATORY_TRACT | Status: AC
  Filled 2016-05-10: qty 3

## 2016-05-10 MED ORDER — TIOTROPIUM BROMIDE MONOHYDRATE 18 MCG IN CAPS: 18.0000 ug | ORAL_CAPSULE | RESPIRATORY_TRACT | 12 refills | Status: DC

## 2016-05-10 MED ORDER — AEROCHAMBER PLUS FLO-VU MEDIUM MISC: 1.0000 | Freq: Once | 0 refills | Status: AC

## 2016-05-10 MED ORDER — HYDROCHLOROTHIAZIDE 25 MG PO TABS: 25.0000 mg | ORAL_TABLET | Freq: Every day | ORAL | 0 refills | Status: DC

## 2016-05-10 MED ORDER — IPRATROPIUM-ALBUTEROL 0.5-2.5 (3) MG/3ML IN SOLN: 3.0000 mL | RESPIRATORY_TRACT | 0 refills | Status: DC | PRN

## 2016-05-10 MED ORDER — PREDNISONE 50 MG PO TABS: ORAL_TABLET | ORAL | 0 refills | Status: DC

## 2016-05-10 MED ORDER — NICOTINE 14 MG/24HR TD PT24: 14.0000 mg | MEDICATED_PATCH | Freq: Every day | TRANSDERMAL | 0 refills | Status: DC

## 2016-05-10 MED ORDER — IPRATROPIUM-ALBUTEROL 0.5-2.5 (3) MG/3ML IN SOLN
3.0000 mL | Freq: Once | RESPIRATORY_TRACT | Status: AC
  Administered 2016-05-10: 3 mL via RESPIRATORY_TRACT

## 2016-05-10 NOTE — ED Triage Notes (Signed)
Here for persistent cold sx   Seen here on 05/04/2016  Sx today include: nasal congestion, prod cough, SOB, wheezing  Smoking 2-4 cigs per day  A&O x4... NAD

## 2016-05-10 NOTE — ED Provider Notes (Signed)
[facility]    CSN: [id number] Arrival date & time: 05/10/16  1312     History   Chief Complaint Chief Complaint  Patient presents with  . URI    HPI [name] is a 60 y.o. female.  Known Asthma with overlap COPD [smokes] Prior stress testing for CP 12/2015 htn Prior MDD s/p [facility] stay Neg cardiac cath 2006 osteoarth s/p L total hiip 2015 Recently Rx cough and congestion to ED 12.8.17 Given Rx for Nebulizer and Azithro  Re-presents not feeling any better Coughing still Feels SOB Cannot get her groceries out of the car and walk without being SOB No cp Mild swelling of the LE's bilaterally finished course of prednisone as well as course of azithro.  Awaiting Nebulizer which shyoudl be delivered today  Retired and works part-time helping with [employer]   HPI  Past Medical History:  Diagnosis Date  . Arthritis   . Asthma   . Chest pain 2006  . Chronic back pain   . Hypertension   . Shortness of breath    due to  smoking     Patient Active Problem List   Diagnosis Date Noted  . Unstable angina (HCC) 01/07/2016  . HTN (hypertension) 01/07/2016  . Hypokalemia 11/06/2013  . Acute blood loss anemia 11/06/2013  . Osteoarthritis of left hip 11/05/2013  . 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 ABDOMINAL HYSTERECTOMY  1997  . CARDIAC CATHETERIZATION  2006  . HEMI-MICRODISCECTOMY LUMBAR LAMINECTOMY LEVEL 1  05/14/2012   Procedure: HEMI-MICRODISCECTOMY LUMBAR LAMINECTOMY LEVEL 1;  Surgeon: Jacki Conesonald A Gioffre, MD;  Location: [facility];  Service: Orthopedics;  Laterality: N/A;  Hemi Laminectomy Microdiscectomy L4 - L5 Central (X-Ray)  . ingrown toenail removal    . TONSILLECTOMY    . TOTAL HIP ARTHROPLASTY Left 11/05/2013   Procedure: LEFT TOTAL HIP ARTHROPLASTY;  Surgeon:  Jacki Conesonald A Gioffre, MD;  Location: [facility];  Service: Orthopedics;  Laterality: Left;    OB History    Gravida Para Term Preterm AB Living   3 3           SAB TAB Ectopic Multiple Live Births                   Home Medications    Prior to Admission medications   Medication Sig Start Date End Date Taking? Authorizing Provider  albuterol (PROVENTIL HFA;VENTOLIN HFA) 108 (90 BASE) MCG/ACT inhaler Inhale 2 puffs into the lungs every 4 (four) hours as needed for wheezing or shortness of breath. 09/25/14  Yes Eber HongBrian Miller, MD  azithromycin (ZITHROMAX) 250 MG tablet Take 1 tablet (250 mg total) by mouth daily. Take first 2 tablets together, then 1 every day until finished. 05/04/16  Yes Junius FinnerErin O'Malley, PA-C  enalapril (VASOTEC) 10 MG tablet Take 1 tablet (10 mg total) by mouth daily. 01/08/16  Yes Carolynn CommentBryan Strelow, MD  famotidine (PEPCID) 20 MG tablet Take 1 tablet (20 mg total) by mouth 2 (two) times daily. 11/27/15  Yes Bethel BornKelly Marie Gekas, PA-C  oxyCODONE-acetaminophen (PERCOCET) 10-325 MG tablet Take 1 tablet by mouth every 4 (four) hours as needed for pain.   Yes Historical Provider, MD  potassium chloride SA (K-DUR,KLOR-CON) 20 MEQ tablet Take 1 tablet (20 mEq total) by mouth 2 (two) times daily. For 10days then take qd. 12/04/15  Yes Fayrene FearingJames  Sallyanne Kuster, MD  predniSONE (DELTASONE) 10 MG tablet Take 2 tablets (20 mg total) by mouth daily. 02/28/16  Yes Tharon Aquas, PA  zolpidem (AMBIEN) 10 MG tablet Take 10 mg by mouth at bedtime as needed for sleep.   Yes Historical Provider, MD  albuterol (PROVENTIL HFA;VENTOLIN HFA) 108 (90 Base) MCG/ACT inhaler Inhale 2 puffs into the lungs every 4 (four) hours as needed for wheezing or shortness of breath. 02/28/16   Tharon Aquas, PA  albuterol (PROVENTIL) (5 MG/ML) 0.5% nebulizer solution Take 0.5 mLs (2.5 mg total) by nebulization every 6 (six) hours as needed for wheezing or shortness of breath. 05/04/16   Junius Finner, PA-C  aspirin 81 MG chewable tablet Chew 1  tablet (81 mg total) by mouth daily. 06/08/15   Arby Barrette, MD  diphenhydrAMINE (BENADRYL) 25 MG tablet Take 1 tablet (25 mg total) by mouth every 6 (six) hours. 11/27/15   Bethel Born, PA-C  Multiple Vitamin (MULTIVITAMIN WITH MINERALS) TABS tablet Take 1 tablet by mouth daily.    Historical Provider, MD  nicotine (NICODERM CQ - DOSED IN MG/24 HOURS) 14 mg/24hr patch Place 14 mg onto the skin daily.    Historical Provider, MD  omeprazole (PRILOSEC) 20 MG capsule Take 1 capsule (20 mg total) by mouth daily. 06/08/15   Arby Barrette, MD  predniSONE (DELTASONE) 20 MG tablet 3 tabs po day one, then 2 po daily x 4 days 05/04/16   Junius Finner, PA-C    Family History Family History  Problem Relation Age of Onset  . Heart attack Mother 60  . Hypertension Sister   . Diabetes type II Sister   . Tongue cancer Brother   . Heart attack Sister 12  . Lung cancer Sister   . Hypertension Sister     Social History Social History  Substance Use Topics  . Smoking status: Current Every Day Smoker    Packs/day: 0.50    Years: 30.00    Types: Cigarettes  . Smokeless tobacco: Never Used     Comment: Smoking since teenage years  . Alcohol use No     Allergies   Patient has no known allergies.   Review of Systems Review of Systems   No dark stool No tarry stool No unilat weak No fever some cough No abd pain No cp   Physical Exam Triage Vital Signs ED Triage Vitals  Enc Vitals Group     BP 05/10/16 1350 145/75     Pulse Rate 05/10/16 1350 71     Resp 05/10/16 1350 16     Temp 05/10/16 1350 98.9 F (37.2 C)     Temp Source 05/10/16 1350 Oral     SpO2 05/10/16 1350 100 %     Weight --      Height --      Head Circumference --      Peak Flow --      Pain Score 05/10/16 1354 8     Pain Loc --      Pain Edu? --      Excl. in GC? --    No data found.   Updated Vital Signs BP 145/75 (BP Location: Left Arm)   Pulse 71   Temp 98.9 F (37.2 C) (Oral)   Resp 16   SpO2  100%   Visual Acuity Right Eye Distance:   Left Eye Distance:   Bilateral Distance:    Right Eye Near:   Left Eye Near:  Bilateral Near:     Physical Exam eomi ncat Looks well Mild jvd Chest clinically clear post but mild wheeze anteriosly s1s2 no m/r/g No PMI dispclaced abdd soft nt nd Trace LE edema   UC Treatments / Results  Labs (all labs ordered are listed, but only abnormal results are displayed) Labs Reviewed - No data to display  EKG  EKG Interpretation None       Radiology No results found.  Procedures Procedures (including critical care time)  Medications Ordered in UC Medications - No data to display   Initial Impression / Assessment and Plan / UC Course  I have reviewed the triage vital signs and the nursing notes.  Pertinent labs & imaging results that were available during my care of the patient were reviewed by me and considered in my medical decision making (see chart for details).  Clinical Course     probable COPD flare Already been given Azithro on 12/8  Nebulise x 1 now  3:11 PM Sounds better and talking a little clearer sats in the 100 % range  Will need to give also Spriva as a controller med  Ddx would be mild AECHF  Given blood pressure is slightly up, would add HCTZ low dose to meds, have her get a bmet in 1 week and as an OP get an informal echo with Dr. Algie CofferKadakia of Cardiology  Will also call in for a spacer to use with her Inhalers  Final Clinical Impressions(s) / UC Diagnoses   Final diagnoses:  COPD exacerbation (HCC)  Hypertension, unspecified type  Smoker  Acute diastolic congestive heart failure (HCC)    New Prescriptions New Prescriptions   HYDROCHLOROTHIAZIDE (HYDRODIURIL) 25 MG TABLET    Take 1 tablet (25 mg total) by mouth daily.   IPRATROPIUM-ALBUTEROL (DUONEB) 0.5-2.5 (3) MG/3ML SOLN    Take 3 mLs by nebulization every 4 (four) hours as needed.   NICOTINE (NICODERM CQ) 14 MG/24HR PATCH    Place 1  patch (14 mg total) onto the skin daily.   PREDNISONE (DELTASONE) 50 MG TABLET    Take 1 tablet daily and stop when over   SPACER/AERO-HOLDING CHAMBERS (AEROCHAMBER PLUS FLO-VU MEDIUM) MISC    1 each by Other route once.   TIOTROPIUM (SPIRIVA HANDIHALER) 18 MCG INHALATION CAPSULE    Place 1 capsule (18 mcg total) into inhaler and inhale 1 day or 1 dose.     Rhetta MuraJai-Gurmukh Lasean Rahming, MD 05/10/16 [phone]

## 2016-05-10 NOTE — Discharge Instructions (Signed)
You have probably some COPD-this is from smoking.  Additionally You might have some fluid in your lungs because of some heart failure I will start you on a new med for your blood pressure with will make you lose that fluid You should see your regular doctor in 1-2 weeks and follow-up and get lab-work

## 2016-06-18 ENCOUNTER — Encounter: Payer: Self-pay | Admitting: Internal Medicine

## 2016-06-18 ENCOUNTER — Ambulatory Visit (INDEPENDENT_AMBULATORY_CARE_PROVIDER_SITE_OTHER): Payer: Self-pay | Admitting: Internal Medicine

## 2016-06-18 VITALS — BP 124/88 | HR 70 | Resp 12 | Ht 60.0 in | Wt 148.0 lb

## 2016-06-18 DIAGNOSIS — I10 Essential (primary) hypertension: Secondary | ICD-10-CM

## 2016-06-18 DIAGNOSIS — J410 Simple chronic bronchitis: Secondary | ICD-10-CM

## 2016-06-18 DIAGNOSIS — Z720 Tobacco use: Secondary | ICD-10-CM

## 2016-06-18 DIAGNOSIS — H547 Unspecified visual loss: Secondary | ICD-10-CM

## 2016-06-18 MED ORDER — NICOTINE 14 MG/24HR TD PT24: 14.0000 mg | MEDICATED_PATCH | Freq: Every day | TRANSDERMAL | 0 refills | Status: DC

## 2016-06-18 MED ORDER — NICOTINE 7 MG/24HR TD PT24: 7.0000 mg | MEDICATED_PATCH | Freq: Every day | TRANSDERMAL | 0 refills | Status: DC

## 2016-06-18 MED ORDER — IPRATROPIUM-ALBUTEROL 0.5-2.5 (3) MG/3ML IN SOLN: RESPIRATORY_TRACT | 11 refills | Status: DC

## 2016-06-18 NOTE — Patient Instructions (Signed)
I will check into cost for something to replace handihaler

## 2016-06-18 NOTE — Progress Notes (Signed)
Subjective:    Patient ID: [name], female    DOB: 1955-09-29, 61 y.o.   MRN: [id number]  HPI   Here to establish:  Had an open illness appointment an patient a walk in.  [facility] Dr Mayford KnifeWilliams We have time for 2 problems today and then she will come back for a full visit on 2/14.  1.  Essential Hypertension:  Has combination Enalapril/HCTZ that was not documented as such at her Urgent Care visit 12/14.  She returned back to that after the visit where she was prescribed a separate Rx for HCTZ 25 mg and takes daily.  She did not obtain the separate HCTZ 25 mg prescription given. Does not need refills.  Has appt. For Cardiology for evaluation as concern for CHF at her last Urgent Care visit.   She sees Cardiology this week.  2.  COPD:  Has smoked for about 44 years.  Currently smoking 1/2 ppd.  Had been using nicotine patches from Urgent care visit for 7 days and able to come off cigarettes, but ran out before could find someone to continue the prescription.    Not needing rescue inhaler or Duoneb currently as using Spiriva once daily.  Later states she is stretching out the Spiriva as it costs her $30 per month.  Pharmacy states $20 last pickup. Appears to have had Prednisone bursts regularly with urgent care visits.  Denies every having been on an inhaled steroid. Has not had influenza vaccine.   Has not had pneumococcal vaccine.  3.  Dental problems:  Needs a dentist.  No coverage.  4.  Eye:  Needs vision.  Current Meds  Medication Sig  . albuterol (PROVENTIL HFA;VENTOLIN HFA) 108 (90 BASE) MCG/ACT inhaler Inhale 2 puffs into the lungs every 4 (four) hours as needed for wheezing or shortness of breath.  Marland Kitchen. albuterol (PROVENTIL) (5 MG/ML) 0.5% nebulizer solution Take 0.5 mLs (2.5 mg total) by nebulization every 6 (six) hours as needed for wheezing or shortness of breath.  . enalapril-hydrochlorothiazide (VASERETIC) 10-25 MG tablet Take 1 tablet by  mouth daily.  Marland Kitchen. ipratropium-albuterol (DUONEB) 0.5-2.5 (3) MG/3ML SOLN Take 3 mLs by nebulization every 4 (four) hours as needed.  . Multiple Vitamin (MULTIVITAMIN WITH MINERALS) TABS tablet Take 1 tablet by mouth daily.  Marland Kitchen. oxyCODONE-acetaminophen (PERCOCET) 10-325 MG tablet Take 1 tablet by mouth every 4 (four) hours as needed for pain.  Marland Kitchen. tiotropium (SPIRIVA HANDIHALER) 18 MCG inhalation capsule Place 1 capsule (18 mcg total) into inhaler and inhale 1 day or 1 dose.  . vitamin B-12 (CYANOCOBALAMIN) 500 MCG tablet Take 500 mcg by mouth daily.  Marland Kitchen. zolpidem (AMBIEN) 10 MG tablet Take 10 mg by mouth at bedtime as needed for sleep.  . [DISCONTINUED] enalapril (VASOTEC) 10 MG tablet Take 1 tablet (10 mg total) by mouth daily.  . [DISCONTINUED] hydrochlorothiazide (HYDRODIURIL) 25 MG tablet Take 1 tablet (25 mg total) by mouth daily.    No Known Allergies        Review of Systems     Objective:   Physical Exam NAD HEENT:  PERRL, EOMI, discs sharp, TMs pearly gray, throat without injection. Neck:  Supple, No adenopathy, no thyromegaly Chest:  CTA CV:  RRR with normal S1 and S2, No S3, S4 or murmur, radial and DP pulses normal and equal.  LE without edema. Abd:  S, NT, No HSM or mass, + BS         Assessment &  Plan:  1.  Essential Hypertension:  Controlled.  Has cardiac evaluation later this week.  2.  Chronic Bronchitis:  Checked with pharmacy as above after patient left.  Cost $20 last Rx.  Unable to obtain Ipratropium inhaler, but can get the nebulized version, only in combination with Albuterol.  Called patient back and discussed she could use this in her nabulizer twice daily if unable to purchase the Spiriva.  3.  Tobacco Abuse:  Discussed at length using nicotine patches starting with 14 mg as that is what worked for her before, to quit.  Handout given with contact info for support services. Rx for 14 mg nicotine patches for 28 days and 7 mg patches thereafter for 14  days.  Keep appt. In February

## 2016-07-11 ENCOUNTER — Ambulatory Visit: Payer: Self-pay | Admitting: Internal Medicine

## 2016-08-17 ENCOUNTER — Ambulatory Visit (HOSPITAL_COMMUNITY)
Admission: EM | Admit: 2016-08-17 | Discharge: 2016-08-17 | Disposition: A | Discharge status: Home / Self Care | Payer: Managed Care, Other (non HMO) | Attending: Internal Medicine | Admitting: Internal Medicine

## 2016-08-17 ENCOUNTER — Encounter (HOSPITAL_COMMUNITY): Payer: Self-pay | Admitting: Emergency Medicine

## 2016-08-17 DIAGNOSIS — M7052 Other bursitis of knee, left knee: Secondary | ICD-10-CM | POA: Diagnosis not present

## 2016-08-17 NOTE — ED Provider Notes (Signed)
CSN: [id number]     Arrival date & time 08/17/16  1412 History   None    Chief Complaint  Patient presents with  . Knee Pain   (Consider location/radiation/quality/duration/timing/severity/associated sxs/prior Treatment) Patient presenting with left knee swelling for 5 days duration. States that she was on her knees for a while looking under her bed on a day had swelling in her left knee. Denies any lower leg swelling. Indicates that she has had a hip replacement several years ago, no issues after that. Denies any blood clots ever. Denies any history of GOUT. DENIES ANY FEVERS, CHILLS, NAUSEA, VOMITING.       Past Medical History:  Diagnosis Date  . Arthritis   . Asthma   . Chest pain 2006  . Chronic back pain   . Hypertension   . Shortness of breath    due to  smoking    Past Surgical History:  Procedure Laterality Date  . ABDOMINAL HYSTERECTOMY  1997  . CARDIAC CATHETERIZATION  2006  . HEMI-MICRODISCECTOMY LUMBAR LAMINECTOMY LEVEL 1  05/14/2012   Procedure: HEMI-MICRODISCECTOMY LUMBAR LAMINECTOMY LEVEL 1;  Surgeon: Jacki Conesonald A Gioffre, MD;  Location: [facility];  Service: Orthopedics;  Laterality: N/A;  Hemi Laminectomy Microdiscectomy L4 - L5 Central (X-Ray)  . ingrown toenail removal    . TONSILLECTOMY    . TOTAL HIP ARTHROPLASTY Left 11/05/2013   Procedure: LEFT TOTAL HIP ARTHROPLASTY;  Surgeon: Jacki Conesonald A Gioffre, MD;  Location: [facility];  Service: Orthopedics;  Laterality: Left;   Family History  Problem Relation Age of Onset  . Heart attack Mother 2257  . Hypertension Sister   . Diabetes type II Sister   . Tongue cancer Brother   . Heart attack Sister [id number]  . Lung cancer Sister   . Hypertension Sister    Social History  Substance Use Topics  . Smoking status: Current Every Day Smoker    Packs/day: 0.50    Years: 30.00    Types: Cigarettes  . Smokeless tobacco: Never Used     Comment: Smoking since teenage years  . Alcohol use No   OB History    Gravida Para Term  Preterm AB Living   3 3           SAB TAB Ectopic Multiple Live Births                 Review of Systems  Allergies  Patient has no known allergies.  Home Medications   Prior to Admission medications   Medication Sig Start Date End Date Taking? Authorizing Provider  albuterol (PROVENTIL HFA;VENTOLIN HFA) 108 (90 BASE) MCG/ACT inhaler Inhale 2 puffs into the lungs every 4 (four) hours as needed for wheezing or shortness of breath. 09/25/14   Eber HongBrian Miller, MD  albuterol (PROVENTIL HFA;VENTOLIN HFA) 108 (90 Base) MCG/ACT inhaler Inhale 2 puffs into the lungs every 4 (four) hours as needed for wheezing or shortness of breath. Patient not taking: Reported on 06/18/2016 02/28/16   Tharon AquasFrank C Patrick, PA  albuterol (PROVENTIL) (5 MG/ML) 0.5% nebulizer solution Take 0.5 mLs (2.5 mg total) by nebulization every 6 (six) hours as needed for wheezing or shortness of breath. 05/04/16   Junius FinnerErin O'Malley, PA-C  aspirin 81 MG chewable tablet Chew 1 tablet (81 mg total) by mouth daily. Patient not taking: Reported on 06/18/2016 06/08/15   Arby BarretteMarcy Pfeiffer, MD  enalapril-hydrochlorothiazide (VASERETIC) 10-25 MG tablet Take 1 tablet by mouth daily.    Historical Provider, MD  famotidine (PEPCID) 20 MG  tablet Take 1 tablet (20 mg total) by mouth 2 (two) times daily. Patient not taking: Reported on 06/18/2016 11/27/15   Bethel Born, PA-C  ipratropium-albuterol (DUONEB) 0.5-2.5 (3) MG/3ML SOLN Nebulize 1 vial for inhalation twice daily 06/18/16   Julieanne Manson, MD  Multiple Vitamin (MULTIVITAMIN WITH MINERALS) TABS tablet Take 1 tablet by mouth daily.    Historical Provider, MD  nicotine (NICODERM CQ - DOSED IN MG/24 HR) 7 mg/24hr patch Place 1 patch (7 mg total) onto the skin daily. Start this after finish 14 mg patches for 28 days 06/18/16   Julieanne Manson, MD  nicotine (NICODERM CQ) 14 mg/24hr patch Place 1 patch (14 mg total) onto the skin daily. 06/18/16   Julieanne Manson, MD  omeprazole (PRILOSEC) 20 MG  capsule Take 1 capsule (20 mg total) by mouth daily. Patient not taking: Reported on 06/18/2016 06/08/15   Arby Barrette, MD  oxyCODONE-acetaminophen (PERCOCET) 10-325 MG tablet Take 1 tablet by mouth every 4 (four) hours as needed for pain.    Historical Provider, MD  potassium chloride SA (K-DUR,KLOR-CON) 20 MEQ tablet Take 1 tablet (20 mEq total) by mouth 2 (two) times daily. For 10days then take qd. Patient not taking: Reported on 06/18/2016 12/04/15   Linna Hoff, MD  tiotropium (SPIRIVA HANDIHALER) 18 MCG inhalation capsule Place 1 capsule (18 mcg total) into inhaler and inhale 1 day or 1 dose. 05/10/16 06/04/17  Rhetta Mura, MD  vitamin B-12 (CYANOCOBALAMIN) 500 MCG tablet Take 500 mcg by mouth daily.    Historical Provider, MD  zolpidem (AMBIEN) 10 MG tablet Take 10 mg by mouth at bedtime as needed for sleep.    Historical Provider, MD   Meds Ordered and Administered this Visit  Medications - No data to display  BP 129/77   Pulse 88   Temp 98.7 F (37.1 C) (Oral)   Resp 16   Ht 5\' 1"  (1.549 m)   Wt 138 lb (62.6 kg)   SpO2 98%   BMI 26.07 kg/m  No data found.   Physical Exam  Constitutional: She is oriented to person, place, and time. She appears well-developed and well-nourished.  HENT:  Head: Normocephalic and atraumatic.  Eyes: Conjunctivae are normal. Pupils are equal, round, and reactive to light.  Neck: Normal range of motion. Neck supple.  Cardiovascular: Normal rate, regular rhythm, normal heart sounds and intact distal pulses.   Pulmonary/Chest: Effort normal and breath sounds normal.  Abdominal: Soft. Bowel sounds are normal.  Musculoskeletal:  Swelling in left knee specifically along the lateral knee, pain to palpation on the front of the knee no pain with palpation in the popliteal fossae, normal range of motion in knee though painful, 5 out of 5 strength , negative for calf pain, no lower extremity edema or swelling,   Neurological: She is alert and  oriented to person, place, and time.    Urgent Care Course     Procedures (including critical care time)  Labs Review Labs Reviewed - No data to display  Imaging Review No results found.    MDM   1. Suprapatellar bursitis of left knee    Localized knee swelling without history of trauma following repetitive movement, bending on her knees. No leg swelling, or calf tenderness, therefore no concern for clot. Minimal minimally warm to the touch compared to the right knee, no erythema, good range of motion, no history of fever, therefore unlikely infectious. Unlikely gouty arthritis given patient's range of motion, no history of this  though potential due to antihypertensives.  Use ibuprofen and Tylenol for pain  Also try exercises  Follow up as needed - should improve with time      Sabrina Keough Mayra Reel, MD 08/17/16 [phone]

## 2016-08-17 NOTE — Discharge Instructions (Signed)
You can use tylenol and ibuprofen as needed for pain

## 2016-08-17 NOTE — ED Triage Notes (Signed)
PT reports left knee pain and swelling that started five days ago. PT is unaware of any injury. PT has elevated knee and applied ice without relief

## 2016-08-20 ENCOUNTER — Encounter: Payer: Self-pay | Admitting: Internal Medicine

## 2016-08-20 ENCOUNTER — Ambulatory Visit (INDEPENDENT_AMBULATORY_CARE_PROVIDER_SITE_OTHER): Payer: Self-pay | Admitting: Internal Medicine

## 2016-08-20 VITALS — BP 122/78 | HR 68 | Resp 12 | Ht 61.0 in | Wt 145.0 lb

## 2016-08-20 DIAGNOSIS — F419 Anxiety disorder, unspecified: Secondary | ICD-10-CM

## 2016-08-20 DIAGNOSIS — M25462 Effusion, left knee: Secondary | ICD-10-CM

## 2016-08-20 DIAGNOSIS — M25562 Pain in left knee: Secondary | ICD-10-CM

## 2016-08-20 MED ORDER — LORAZEPAM 1 MG PO TABS: ORAL_TABLET | ORAL | 0 refills | Status: DC

## 2016-08-20 NOTE — Progress Notes (Signed)
Subjective:    Patient ID: [name], female    DOB: 1955-12-31, 61 y.o.   MRN: [id number]  HPI   Left knee has been swelling for 2 weeks.  Has never had an injury to her knee.  Did take a new job at [employer] in [employer] and on her feet a lot more.  She is squatting down and kneeling more than usual with her job and with cleaning at home.  Also, to be on flight to [facility] and she is afraid to fly. Has not been on a plane since 9/11.  Getting Oxycodone and Ambien from pain doctor, Dr. ?Dachwa   Current Outpatient Prescriptions:  .  albuterol (PROVENTIL HFA;VENTOLIN HFA) 108 (90 BASE) MCG/ACT inhaler, Inhale 2 puffs into the lungs every 4 (four) hours as needed for wheezing or shortness of breath., Disp: 1 Inhaler, Rfl: 3 .  aspirin 81 MG chewable tablet, Chew 1 tablet (81 mg total) by mouth daily., Disp: 30 tablet, Rfl: 0 .  enalapril-hydrochlorothiazide (VASERETIC) 10-25 MG tablet, Take 1 tablet by mouth daily., Disp: , Rfl:  .  Multiple Vitamin (MULTIVITAMIN WITH MINERALS) TABS tablet, Take 1 tablet by mouth daily., Disp: , Rfl:  .  nicotine (NICODERM CQ - DOSED IN MG/24 HR) 7 mg/24hr patch, Place 1 patch (7 mg total) onto the skin daily. Start this after finish 14 mg patches for 28 days, Disp: 14 patch, Rfl: 0 .  oxyCODONE-acetaminophen (PERCOCET) 10-325 MG tablet, Take 1 tablet by mouth every 4 (four) hours as needed for pain., Disp: , Rfl:  .  vitamin B-12 (CYANOCOBALAMIN) 500 MCG tablet, Take 500 mcg by mouth daily., Disp: , Rfl:  .  zolpidem (AMBIEN) 10 MG tablet, Take 10 mg by mouth at bedtime as needed for sleep., Disp: , Rfl:  .  albuterol (PROVENTIL HFA;VENTOLIN HFA) 108 (90 Base) MCG/ACT inhaler, Inhale 2 puffs into the lungs every 4 (four) hours as needed for wheezing or shortness of breath. (Patient not taking: Reported on 06/18/2016), Disp: 1 Inhaler, Rfl: 11 .  albuterol (PROVENTIL) (5 MG/ML) 0.5% nebulizer solution, Take 0.5 mLs (2.5 mg total) by nebulization  every 6 (six) hours as needed for wheezing or shortness of breath. (Patient not taking: Reported on 08/20/2016), Disp: 20 mL, Rfl: 12 .  famotidine (PEPCID) 20 MG tablet, Take 1 tablet (20 mg total) by mouth 2 (two) times daily. (Patient not taking: Reported on 06/18/2016), Disp: 10 tablet, Rfl: 0 .  ipratropium-albuterol (DUONEB) 0.5-2.5 (3) MG/3ML SOLN, Nebulize 1 vial for inhalation twice daily (Patient not taking: Reported on 08/20/2016), Disp: 180 mL, Rfl: 11 .  nicotine (NICODERM CQ) 14 mg/24hr patch, Place 1 patch (14 mg total) onto the skin daily. (Patient not taking: Reported on 08/20/2016), Disp: 28 patch, Rfl: 0 .  omeprazole (PRILOSEC) 20 MG capsule, Take 1 capsule (20 mg total) by mouth daily. (Patient not taking: Reported on 06/18/2016), Disp: 30 capsule, Rfl: 0 .  potassium chloride SA (K-DUR,KLOR-CON) 20 MEQ tablet, Take 1 tablet (20 mEq total) by mouth 2 (two) times daily. For 10days then take qd. (Patient not taking: Reported on 06/18/2016), Disp: 30 tablet, Rfl: 1 .  tiotropium (SPIRIVA HANDIHALER) 18 MCG inhalation capsule, Place 1 capsule (18 mcg total) into inhaler and inhale 1 day or 1 dose. (Patient not taking: Reported on 08/20/2016), Disp: 30 capsule, Rfl: 12   No Known Allergies Review of Systems     Objective:   Physical Exam NAD. Moves easily onto exam table without  obvious discomfort Left knee with full ROM.  Palpable mild effusion.  Mild discomfort on mid lateral joint line.   No tenderness or laxity of cruciates or collateral ligaments with stress maneuvers. Nontender with compression of patella against anterior joint.       Assessment & Plan:  1.  Left knee effusion with minimal findings otherwise on exam.  Left knee films.   To avoid deep knee bending and kneeling, though does not appear to have a prepatellar bursitis.    2.  Flight anxiety:  Lorazepam 0.5 to 1 mg 15 minutes before her flight.  3.  COPD:  Not taking meds appropriately.  To return in 2 months  and bring in all her meds to go over again.  Not clear what she is doing with nicotine patches as well.

## 2016-10-03 ENCOUNTER — Ambulatory Visit
Admission: RE | Admit: 2016-10-03 | Discharge: 2016-10-03 | Disposition: A | Discharge status: Home / Self Care | Payer: Managed Care, Other (non HMO) | Source: Ambulatory Visit | Attending: Internal Medicine | Admitting: Internal Medicine

## 2016-10-03 DIAGNOSIS — M25462 Effusion, left knee: Secondary | ICD-10-CM

## 2016-10-03 DIAGNOSIS — M25562 Pain in left knee: Principal | ICD-10-CM

## 2016-10-19 ENCOUNTER — Ambulatory Visit: Payer: Self-pay | Admitting: Internal Medicine

## 2016-11-05 ENCOUNTER — Other Ambulatory Visit: Payer: Self-pay

## 2016-11-05 MED ORDER — ENALAPRIL-HYDROCHLOROTHIAZIDE 10-25 MG PO TABS: 1.0000 | ORAL_TABLET | Freq: Every day | ORAL | 3 refills | Status: DC

## 2016-11-07 ENCOUNTER — Observation Stay (HOSPITAL_COMMUNITY)
Admission: EM | Admit: 2016-11-07 | Discharge: 2016-11-07 | Disposition: A | Discharge status: Home / Self Care | Payer: Managed Care, Other (non HMO) | Attending: Student in an Organized Health Care Education/Training Program | Admitting: Student in an Organized Health Care Education/Training Program

## 2016-11-07 ENCOUNTER — Emergency Department (HOSPITAL_COMMUNITY): Payer: Managed Care, Other (non HMO)

## 2016-11-07 ENCOUNTER — Encounter (HOSPITAL_COMMUNITY): Payer: Self-pay | Admitting: *Deleted

## 2016-11-07 DIAGNOSIS — I10 Essential (primary) hypertension: Secondary | ICD-10-CM | POA: Diagnosis present

## 2016-11-07 DIAGNOSIS — J45909 Unspecified asthma, uncomplicated: Secondary | ICD-10-CM | POA: Insufficient documentation

## 2016-11-07 DIAGNOSIS — E039 Hypothyroidism, unspecified: Secondary | ICD-10-CM | POA: Diagnosis not present

## 2016-11-07 DIAGNOSIS — F17210 Nicotine dependence, cigarettes, uncomplicated: Secondary | ICD-10-CM | POA: Diagnosis not present

## 2016-11-07 DIAGNOSIS — Z96642 Presence of left artificial hip joint: Secondary | ICD-10-CM | POA: Insufficient documentation

## 2016-11-07 DIAGNOSIS — R0789 Other chest pain: Secondary | ICD-10-CM | POA: Diagnosis not present

## 2016-11-07 DIAGNOSIS — R0600 Dyspnea, unspecified: Principal | ICD-10-CM | POA: Insufficient documentation

## 2016-11-07 DIAGNOSIS — R0602 Shortness of breath: Secondary | ICD-10-CM | POA: Diagnosis present

## 2016-11-07 DIAGNOSIS — Z7982 Long term (current) use of aspirin: Secondary | ICD-10-CM | POA: Insufficient documentation

## 2016-11-07 DIAGNOSIS — Z79899 Other long term (current) drug therapy: Secondary | ICD-10-CM | POA: Insufficient documentation

## 2016-11-07 DIAGNOSIS — R0609 Other forms of dyspnea: Secondary | ICD-10-CM

## 2016-11-07 HISTORY — DX: Sciatica, unspecified side: M54.30

## 2016-11-07 LAB — BASIC METABOLIC PANEL
Anion gap: 8 (ref 5–15)
BUN: 8 mg/dL (ref 6–20)
CHLORIDE: 102 mmol/L (ref 101–111)
CO2: 31 mmol/L (ref 22–32)
CREATININE: 0.74 mg/dL (ref 0.44–1.00)
Calcium: 9.1 mg/dL (ref 8.9–10.3)
GFR calc non Af Amer: >60 mL/min (ref >60)
Glucose, Bld: 74 mg/dL (ref 65–99)
Potassium: 3 mmol/L — ABNORMAL LOW (ref 3.5–5.1)
Sodium: 141 mmol/L (ref 135–145)

## 2016-11-07 LAB — CBC
HCT: 40.7 % (ref 36.0–46.0)
HEMOGLOBIN: 12.7 g/dL (ref 12.0–15.0)
MCH: 28.8 pg (ref 26.0–34.0)
MCHC: 31.2 g/dL (ref 30.0–36.0)
MCV: 92.3 fL (ref 78.0–100.0)
PLATELETS: 289 10*3/uL (ref 150–400)
RBC: 4.41 MIL/uL (ref 3.87–5.11)
RDW: 14 % (ref 11.5–15.5)
WBC: 5.2 10*3/uL (ref 4.0–10.5)

## 2016-11-07 LAB — LIPID PANEL
CHOLESTEROL: 206 mg/dL — AB (ref 0–200)
HDL: 64 mg/dL (ref >40)
LDL CALC: 119 mg/dL — AB (ref 0–99)
Total CHOL/HDL Ratio: 3.2 RATIO
Triglycerides: 117 mg/dL (ref <150)
VLDL: 23 mg/dL (ref 0–40)

## 2016-11-07 LAB — I-STAT TROPONIN, ED: Troponin i, poc: 0 ng/mL (ref 0.00–0.08)

## 2016-11-07 LAB — BRAIN NATRIURETIC PEPTIDE: B NATRIURETIC PEPTIDE 5: 39 pg/mL (ref 0.0–100.0)

## 2016-11-07 LAB — TSH: TSH: 6.449 u[IU]/mL — ABNORMAL HIGH (ref 0.350–4.500)

## 2016-11-07 LAB — TROPONIN I

## 2016-11-07 LAB — D-DIMER, QUANTITATIVE: D-Dimer, Quant: 0.28 ug/mL-FEU (ref 0.00–0.50)

## 2016-11-07 MED ORDER — ENOXAPARIN SODIUM 40 MG/0.4ML ~~LOC~~ SOLN: 40.0000 mg | SUBCUTANEOUS | Status: DC

## 2016-11-07 MED ORDER — ZOLPIDEM TARTRATE 5 MG PO TABS: 5.0000 mg | ORAL_TABLET | Freq: Every day | ORAL | Status: DC

## 2016-11-07 MED ORDER — OXYCODONE-ACETAMINOPHEN 10-325 MG PO TABS: 1.0000 | ORAL_TABLET | ORAL | Status: DC | PRN

## 2016-11-07 MED ORDER — OXYCODONE HCL 5 MG PO TABS
5.0000 mg | ORAL_TABLET | ORAL | Status: DC | PRN
  Administered 2016-11-07 (×2): 5 mg via ORAL
  Filled 2016-11-07 (×2): qty 1

## 2016-11-07 MED ORDER — ASPIRIN EC 81 MG PO TBEC: 81.0000 mg | DELAYED_RELEASE_TABLET | Freq: Every day | ORAL | Status: DC

## 2016-11-07 MED ORDER — ENALAPRIL-HYDROCHLOROTHIAZIDE 10-25 MG PO TABS: 1.0000 | ORAL_TABLET | Freq: Every day | ORAL | Status: DC

## 2016-11-07 MED ORDER — HYDROCHLOROTHIAZIDE 25 MG PO TABS: 25.0000 mg | ORAL_TABLET | Freq: Every day | ORAL | Status: DC

## 2016-11-07 MED ORDER — IPRATROPIUM-ALBUTEROL 0.5-2.5 (3) MG/3ML IN SOLN
3.0000 mL | Freq: Four times a day (QID) | RESPIRATORY_TRACT | Status: DC
  Filled 2016-11-07: qty 3

## 2016-11-07 MED ORDER — ASPIRIN 81 MG PO CHEW
324.0000 mg | CHEWABLE_TABLET | Freq: Once | ORAL | Status: AC
  Administered 2016-11-07: 324 mg via ORAL
  Filled 2016-11-07: qty 4

## 2016-11-07 MED ORDER — ENOXAPARIN SODIUM 40 MG/0.4ML ~~LOC~~ SOLN
40.0000 mg | SUBCUTANEOUS | Status: DC
  Administered 2016-11-07: 40 mg via SUBCUTANEOUS
  Filled 2016-11-07: qty 0.4

## 2016-11-07 MED ORDER — MORPHINE SULFATE (PF) 4 MG/ML IV SOLN
4.0000 mg | Freq: Once | INTRAVENOUS | Status: AC
  Administered 2016-11-07: 4 mg via INTRAVENOUS
  Filled 2016-11-07: qty 1

## 2016-11-07 MED ORDER — POTASSIUM CHLORIDE CRYS ER 20 MEQ PO TBCR
20.0000 meq | EXTENDED_RELEASE_TABLET | Freq: Two times a day (BID) | ORAL | Status: DC
  Administered 2016-11-07: 20 meq via ORAL
  Filled 2016-11-07: qty 1

## 2016-11-07 MED ORDER — ZOLPIDEM TARTRATE 5 MG PO TABS: 5.0000 mg | ORAL_TABLET | Freq: Every evening | ORAL | Status: DC | PRN

## 2016-11-07 MED ORDER — BACLOFEN 10 MG PO TABS: 10.0000 mg | ORAL_TABLET | Freq: Three times a day (TID) | ORAL | Status: DC | PRN

## 2016-11-07 MED ORDER — NITROGLYCERIN 0.4 MG SL SUBL
0.4000 mg | SUBLINGUAL_TABLET | Freq: Once | SUBLINGUAL | Status: AC
  Administered 2016-11-07: 0.4 mg via SUBLINGUAL
  Filled 2016-11-07: qty 1

## 2016-11-07 MED ORDER — ENALAPRIL MALEATE 5 MG PO TABS: 10.0000 mg | ORAL_TABLET | Freq: Every day | ORAL | Status: DC

## 2016-11-07 MED ORDER — OXYCODONE-ACETAMINOPHEN 5-325 MG PO TABS
1.0000 | ORAL_TABLET | ORAL | Status: DC | PRN
  Administered 2016-11-07 (×2): 1 via ORAL
  Filled 2016-11-07 (×2): qty 1

## 2016-11-07 NOTE — ED Provider Notes (Signed)
[facility]-EMERGENCY DEPT Provider Note   CSN: [id number] Arrival date & time: 11/07/16  0807     History   Chief Complaint Chief Complaint  Patient presents with  . Chest Pain  . Shortness of Breath    HPI [name] is a 61 y.o. female.  The history is provided by the patient.  Chest Pain   This is a new problem. The current episode started more than 1 week ago. The problem occurs daily. The problem has been gradually worsening. The pain is associated with walking. The pain is present in the substernal region. The pain is moderate. The quality of the pain is described as pressure-like and heavy. The pain radiates to the left arm and right arm. Exacerbated by: nothing. Associated symptoms include dizziness, near-syncope, orthopnea, PND and shortness of breath. Pertinent negatives include no abdominal pain, no back pain, no cough, no diaphoresis, no exertional chest pressure, no nausea, no sputum production, no syncope, no vomiting and no weakness. She has tried rest for the symptoms. The treatment provided mild relief.  Shortness of Breath  Associated symptoms include PND, orthopnea and chest pain. Pertinent negatives include no cough, no sputum production, no syncope, no vomiting and no abdominal pain.   61 year old female who presents with chest pressure and shortness of breath. States that this is been intermittent for a while now, but worsening over the past 2 weeks. States the symptoms onset randomly, and not associated always with activity or exertion. States that symptoms typically last for about an hour or so before self resolving. Not associated with eating. Pain described as pressure across her chest rating down bilateral arms and associated with dyspnea. States that was ambulating down the hallway when she be felt very winded and felt like she could pass out. No lower extremity edema or calf tenderness currently, but states that over the past several months has had left thigh pain  and swelling. No prior history of DVT or PE, but has been traveling earlier last month to visit her sister. Has also noted that she has been waking up in the middle of the night feeling winded and needing to sleep more upright.  Past Medical History:  Diagnosis Date  . Arthritis   . Asthma   . Chest pain 2006  . Chronic back pain   . Hypertension   . Shortness of breath    due to  smoking     Patient Active Problem List   Diagnosis Date Noted  . Unstable angina (HCC) 01/07/2016  . HTN (hypertension) 01/07/2016  . Hypokalemia 11/06/2013  . Acute blood loss anemia 11/06/2013  . Osteoarthritis of left hip 11/05/2013  . 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 ABDOMINAL HYSTERECTOMY  1997  . CARDIAC CATHETERIZATION  2006  . HEMI-MICRODISCECTOMY LUMBAR LAMINECTOMY LEVEL 1  05/14/2012   Procedure: HEMI-MICRODISCECTOMY LUMBAR LAMINECTOMY LEVEL 1;  Surgeon: Jacki Cones, MD;  Location: [facility];  Service: Orthopedics;  Laterality: N/A;  Hemi Laminectomy Microdiscectomy L4 - L5 Central (X-Ray)  . ingrown toenail removal    . TONSILLECTOMY    . TOTAL HIP ARTHROPLASTY Left 11/05/2013   Procedure: LEFT TOTAL HIP ARTHROPLASTY;  Surgeon: Jacki Cones, MD;  Location: [facility];  Service: Orthopedics;  Laterality: Left;    OB History    Gravida Para Term Preterm AB Living   3 3  SAB TAB Ectopic Multiple Live Births                   Home Medications    Prior to Admission medications   Medication Sig Start Date End Date Taking? Authorizing Provider  albuterol (PROVENTIL HFA;VENTOLIN HFA) 108 (90 BASE) MCG/ACT inhaler Inhale 2 puffs into the lungs every 4 (four) hours as needed for wheezing or shortness of breath. 09/25/14   Eber Hong, MD  albuterol (PROVENTIL HFA;VENTOLIN HFA) 108 (90 Base) MCG/ACT inhaler Inhale 2 puffs into  the lungs every 4 (four) hours as needed for wheezing or shortness of breath. Patient not taking: Reported on 06/18/2016 02/28/16   Barbra Sarks C, PA  albuterol (PROVENTIL) (5 MG/ML) 0.5% nebulizer solution Take 0.5 mLs (2.5 mg total) by nebulization every 6 (six) hours as needed for wheezing or shortness of breath. Patient not taking: Reported on 08/20/2016 05/04/16   Junius Finner, PA-C  aspirin 81 MG chewable tablet Chew 1 tablet (81 mg total) by mouth daily. 06/08/15   Arby Barrette, MD  enalapril-hydrochlorothiazide (VASERETIC) 10-25 MG tablet Take 1 tablet by mouth daily. 11/05/16   Julieanne Manson, MD  famotidine (PEPCID) 20 MG tablet Take 1 tablet (20 mg total) by mouth 2 (two) times daily. Patient not taking: Reported on 06/18/2016 11/27/15   Bethel Born, PA-C  ipratropium-albuterol (DUONEB) 0.5-2.5 (3) MG/3ML SOLN Nebulize 1 vial for inhalation twice daily Patient not taking: Reported on 08/20/2016 06/18/16   Julieanne Manson, MD  LORazepam (ATIVAN) 1 MG tablet 0.5 tab to 1 tab by mouth 15-30 minutes before flight. 08/20/16   Julieanne Manson, MD  Multiple Vitamin (MULTIVITAMIN WITH MINERALS) TABS tablet Take 1 tablet by mouth daily.    [provider]  nicotine (NICODERM CQ - DOSED IN MG/24 HR) 7 mg/24hr patch Place 1 patch (7 mg total) onto the skin daily. Start this after finish 14 mg patches for 28 days 06/18/16   Julieanne Manson, MD  nicotine (NICODERM CQ) 14 mg/24hr patch Place 1 patch (14 mg total) onto the skin daily. Patient not taking: Reported on 08/20/2016 06/18/16   Julieanne Manson, MD  omeprazole (PRILOSEC) 20 MG capsule Take 1 capsule (20 mg total) by mouth daily. Patient not taking: Reported on 06/18/2016 06/08/15   Arby Barrette, MD  oxyCODONE-acetaminophen (PERCOCET) 10-325 MG tablet Take 1 tablet by mouth every 4 (four) hours as needed for pain.    [provider]  potassium chloride SA (K-DUR,KLOR-CON) 20 MEQ tablet Take 1 tablet (20  mEq total) by mouth 2 (two) times daily. For 10days then take qd. Patient not taking: Reported on 06/18/2016 12/04/15   Linna Hoff, MD  tiotropium (SPIRIVA HANDIHALER) 18 MCG inhalation capsule Place 1 capsule (18 mcg total) into inhaler and inhale 1 day or 1 dose. Patient not taking: Reported on 08/20/2016 05/10/16 06/04/17  Rhetta Mura, MD  vitamin B-12 (CYANOCOBALAMIN) 500 MCG tablet Take 500 mcg by mouth daily.    [provider]  zolpidem (AMBIEN) 10 MG tablet Take 10 mg by mouth at bedtime as needed for sleep.    [provider]    Family History Family History  Problem Relation Age of Onset  . Heart attack Mother 67  . Hypertension Sister   . Diabetes type II Sister   . Tongue cancer Brother   . Heart attack Sister 31  . Lung cancer Sister   . Hypertension Sister     Social History Social History  Substance Use Topics  .  Smoking status: Current Every Day Smoker    Packs/day: 0.25    Years: 30.00    Types: Cigarettes  . Smokeless tobacco: Never Used     Comment: Smoking since teenage years  . Alcohol use No     Allergies   Patient has no known allergies.   Review of Systems Review of Systems  Constitutional: Negative for diaphoresis.  Respiratory: Positive for shortness of breath. Negative for cough and sputum production.   Cardiovascular: Positive for chest pain, orthopnea, PND and near-syncope. Negative for syncope.  Gastrointestinal: Negative for abdominal pain, nausea and vomiting.  Musculoskeletal: Negative for back pain.  Neurological: Positive for dizziness. Negative for weakness.  All other systems reviewed and are negative.    Physical Exam Updated Vital Signs BP 107/68   Pulse (!) 54   Temp 98.1 F (36.7 C) (Oral)   Resp 13   Ht 5\' 1"  (1.549 m)   Wt 63.5 kg (140 lb)   SpO2 100%   BMI 26.45 kg/m   Physical Exam Physical Exam  Nursing note and vitals reviewed. Constitutional: Well developed, well nourished,  non-toxic, and in no acute distress Head: Normocephalic and atraumatic.  Mouth/Throat: Oropharynx is clear and moist.  Neck: Normal range of motion. Neck supple.  Cardiovascular: Normal rate and regular rhythm.   Pulmonary/Chest: Effort normal and breath sounds normal.  Abdominal: Soft. There is no tenderness. There is no rebound and no guarding.  Musculoskeletal: Normal range of motion. no edema. Neurological: Alert, no facial droop, fluent speech, moves all extremities symmetrically Skin: Skin is warm and dry.  Psychiatric: Cooperative   ED Treatments / Results  Labs (all labs ordered are listed, but only abnormal results are displayed) Labs Reviewed  BASIC METABOLIC PANEL - Abnormal; Notable for the following:       Result Value   Potassium 3.0 (*)    All other components within normal limits  CBC  D-DIMER, QUANTITATIVE (NOT AT [facility])  BRAIN NATRIURETIC PEPTIDE  TROPONIN I  TROPONIN I  TROPONIN I  I-STAT TROPOININ, ED    EKG  EKG Interpretation  Date/Time:  Wednesday November 07 2016 08:11:48 EDT Ventricular Rate:  74 PR Interval:  152 QRS Duration: 88 QT Interval:  404 QTC Calculation: 448 R Axis:   54 Text Interpretation:  Normal sinus rhythm with sinus arrhythmia Septal infarct , age undetermined Abnormal ECG No acute changes Confirmed by Crista Curb [phone]) on 11/07/2016 8:38:22 AM       Radiology Dg Chest 2 View  Result Date: 11/07/2016 CLINICAL DATA:  Chest pain and shortness of Breath EXAM: CHEST  2 VIEW COMPARISON:  05/04/2016 FINDINGS: Cardiac shadow is within normal limits. The lungs are hyper air bilaterally. No focal infiltrate or sizable effusion is seen. Degenerative change of the thoracic spine is noted. IMPRESSION: COPD without acute abnormality. Electronically Signed   By: Alcide Clever M.D.   On: 11/07/2016 08:41    Procedures Procedures (including critical care time)  Medications Ordered in ED Medications  nitroGLYCERIN (NITROSTAT) SL tablet 0.4 mg  (0.4 mg Sublingual Given 11/07/16 0951)  aspirin chewable tablet 324 mg (324 mg Oral Given 11/07/16 0951)  morphine 4 MG/ML injection 4 mg (4 mg Intravenous Given 11/07/16 1051)     Initial Impression / Assessment and Plan / ED Course  I have reviewed the triage vital signs and the nursing notes.  Pertinent labs & imaging results that were available during my care of the patient were reviewed by me and considered  in my medical decision making (see chart for details).     Records reviewed. Stress test low risk when admitted for similar symptoms 12/2015.  History partially concerning, given exertional dyspnea and near syncope. Some new symptoms c/f possible CHF, although CXR and lungs clear and normal BNP. Heart score 5 as well. Possible angina despite normal stress recently. May also benefit from ECHO.  EKG reassuring, not ischemic. Troponin negative. Low risk for PE, and negative ddimer in setting of DOE, intermittent left leg swelling/pain, and recent traveling. Felt ruled out for PE. Atypical history for dissection.   Discussed with internal medicine teaching service. Will admit for observation.    Final Clinical Impressions(s) / ED Diagnoses   Final diagnoses:  DOE (dyspnea on exertion)  Atypical chest pain    New Prescriptions New Prescriptions   No medications on file     Lavera GuiseLiu, Makail Watling Duo, MD 11/07/16 [phone]

## 2016-11-07 NOTE — ED Notes (Signed)
Patient states pain is worse after nitroglycerin, requests heat pack to place over her chest for comfort.

## 2016-11-07 NOTE — H&P (Signed)
Date: 11/07/2016               Patient Name:  [name] MRN: [id number]  DOB: Jun 27, 1955 Age / Sex: 61 y.o., female   PCP: System, Pcp Not In         Medical Service: Internal Medicine Teaching Service         Attending Physician: Dr. Oswaldo Done, Marquita Palms, *    First Contact: Dr. Thomasene Lot Pager: [phone]  Second Contact: Dr. Darreld Mclean Pager: [phone]       After Hours (After 5p/  First Contact Pager: [phone]  weekends / holidays): Second Contact Pager: [phone]   Chief Complaint: Chest pain  History of Present Illness: 61 y.o. female with a h/o smoking, HTN, asthma and chronic back pain who presents with intermittent chest pain for over a year. Patient describes this as a pressure in the center of her chest. It appears to be more of a pressure than a pain. It comes and goes. Sometimes is associated with n/v and other times it is not. Patient has some diaphoresis but this is not always associated with the pain. Most recent episode of pain started yesterday evening. Upon awaking patient continued to have chest pain prompting her to come to the ED. currently, she denies nausea or vomiting. She denies fevers or chills. She denies abdominal pain, constipation or diarrhea. She denies polyuria or dysuria. Admitted in August 2017 with similar symptoms and had a stress test which was low risk.  In the emergency department the patient was afebrile and normotensive. She was bradycardic with a pulse in the mid 50s. CBC unremarkable. D-dimer negative. BNP normal. I-STAT troponin negative. Chest x-ray without acute cardiopulmonary abnormality. Patient given aspirin and morphine in the emergency department with some improvement of her symptoms. She will be admitted to IMTS for chest pain evaluation.   Meds:  Current Meds  Medication Sig  . albuterol (PROVENTIL HFA;VENTOLIN HFA) 108 (90 BASE) MCG/ACT inhaler Inhale 2 puffs into the lungs every 4 (four) hours as needed for wheezing or  shortness of breath.  Marland Kitchen albuterol (PROVENTIL) (5 MG/ML) 0.5% nebulizer solution Take 0.5 mLs (2.5 mg total) by nebulization every 6 (six) hours as needed for wheezing or shortness of breath.  Marland Kitchen aspirin 81 MG chewable tablet Chew 1 tablet (81 mg total) by mouth daily.  . baclofen (LIORESAL) 10 MG tablet Take 10 mg by mouth 3 (three) times daily as needed for muscle spasms.  . enalapril-hydrochlorothiazide (VASERETIC) 10-25 MG tablet Take 1 tablet by mouth daily.  . methylPREDNISolone (MEDROL DOSEPAK) 4 MG TBPK tablet Take 1-6 tablets by mouth as directed.  . Multiple Vitamin (MULTIVITAMIN WITH MINERALS) TABS tablet Take 1 tablet by mouth daily.  . nicotine (NICODERM CQ - DOSED IN MG/24 HR) 7 mg/24hr patch Place 1 patch (7 mg total) onto the skin daily. Start this after finish 14 mg patches for 28 days  . oxyCODONE-acetaminophen (PERCOCET) 10-325 MG tablet Take 1 tablet by mouth every 4 (four) hours as needed for pain.  . potassium chloride SA (K-DUR,KLOR-CON) 20 MEQ tablet Take 1 tablet (20 mEq total) by mouth 2 (two) times daily. For 10days then take qd. (Patient taking differently: Take 20 mEq by mouth daily as needed (if not feeling good). )  . tetrahydrozoline 0.05 % ophthalmic solution Place 1 drop into both eyes daily as needed (red eyes).  . vitamin B-12 (CYANOCOBALAMIN) 500 MCG tablet Take 500 mcg by mouth daily.  Marland Kitchen zolpidem (  AMBIEN) 10 MG tablet Take 10 mg by mouth at bedtime as needed for sleep.     Allergies: Allergies as of 11/07/2016  . (No Known Allergies)   Past Medical History:  Diagnosis Date  . Arthritis   . Asthma   . Chest pain 2006  . Chronic back pain   . Hypertension   . Shortness of breath    due to  smoking     Family History:  Family History  Problem Relation Age of Onset  . Heart attack Mother 80  . Hypertension Sister   . Diabetes type II Sister   . Tongue cancer Brother   . Heart attack Sister 61  . Lung cancer Sister   . Hypertension Sister       Social History:  Social History   Social History  . Marital status: Single    Spouse name: N/A  . Number of children: N/A  . Years of education: N/A   Occupational History  . Retired    Social History Main Topics  . Smoking status: Current Every Day Smoker    Packs/day: 0.25    Years: 30.00    Types: Cigarettes  . Smokeless tobacco: Never Used     Comment: Smoking since teenage years  . Alcohol use No  . Drug use: No  . Sexual activity: No   Other Topics Concern  . Not on file   Social History Narrative   Great-grandmother   Used to work as a Corporate treasurer, now volunteers w/ free time at [employer]     Review of Systems: A complete ROS was negative except as per HPI.   Physical Exam: Blood pressure 140/88, pulse (!) 58, temperature 98.1 F (36.7 C), temperature source Oral, resp. rate 15, height 5\' 1"  (1.549 m), weight 140 lb (63.5 kg), SpO2 99 %. Physical Exam  Constitutional: She is oriented to person, place, and time. She appears well-developed and well-nourished.  HENT:  Head: Normocephalic and atraumatic.  Cardiovascular: Regular rhythm.  Exam reveals no friction rub.   No murmur heard. Bradycardic  Respiratory: Effort normal and breath sounds normal. No respiratory distress. She has no wheezes.  GI: Soft. Bowel sounds are normal. She exhibits no distension. There is no tenderness.  Musculoskeletal: She exhibits no edema.  Tenderness to palpation over the sternum  Neurological: She is alert and oriented to person, place, and time.    EKG: No acute ST segment elevation  CXR: No acute cardiopulmonary abnormality  Assessment & Plan by Problem: Principal Problem:   Atypical chest pain Active Problems:   HTN (hypertension)   # Chest pain Patient presented with intermittent chest pain for over a year duration. Chest pain is described as a pressure in the center chest. Sometimes it is associated with diaphoresis, nausea and vomiting. Other  times it is not. Nothing new about quality today that made her come to the emergency department. Prior stress test low risk. EKG reassuring. Laboratory evaluation unremarkable. Negative i-STAT troponin. Chest x-ray normal. Patient's risk factors include hypertension and smoking. Chest pain story seems atypical to me. Pain elicited to palpation of the sternum. We'll admit to trend troponins. Current HEART score based on my estimation of 3-4. If troponin returns elevated cardiology will need to evaluate for possible in hospital stress test.We'll also proceed with echocardiogram. -- Troponins 3 every 6 hours -- Echocardiogram -- Telemetry -- Repeat EKG -- TSH -- Hemoglobin A1c -- Lipid panel  # Hypertension -- Continue combination of enalapril  and hydrochlorothiazide  # Chronic pain -- Continue home oxycodone acetaminophen  DVT/PE prophylaxis: Lovenox FEN/GI: Heart healthy Code: Full code  Dispo: Admit patient to Observation with expected length of stay less than 2 midnights.  Signed: Thomasene Lotaylor, Deddrick Saindon, MD 11/07/2016, 12:35 PM  Pager: [phone]

## 2016-11-07 NOTE — Progress Notes (Signed)
Patient wants to AMA. RN talk to the patient about the risk. Patient still wants to leave. Patient signed AMA paper. N.P. oncall Informed. Charge nurse informed.  

## 2016-11-07 NOTE — ED Triage Notes (Signed)
Pt states chest pain and sob since last night.  Pain is consistent weather sitting, ambulating, etc.

## 2016-11-08 ENCOUNTER — Ambulatory Visit (INDEPENDENT_AMBULATORY_CARE_PROVIDER_SITE_OTHER): Payer: Managed Care, Other (non HMO) | Admitting: Internal Medicine

## 2016-11-08 ENCOUNTER — Telehealth: Payer: Self-pay | Admitting: Internal Medicine

## 2016-11-08 ENCOUNTER — Encounter: Payer: Self-pay | Admitting: Internal Medicine

## 2016-11-08 VITALS — BP 125/76 | HR 84 | Temp 97.1°F | Wt 145.6 lb

## 2016-11-08 DIAGNOSIS — K5909 Other constipation: Secondary | ICD-10-CM | POA: Diagnosis not present

## 2016-11-08 DIAGNOSIS — K219 Gastro-esophageal reflux disease without esophagitis: Secondary | ICD-10-CM | POA: Insufficient documentation

## 2016-11-08 DIAGNOSIS — Z5189 Encounter for other specified aftercare: Secondary | ICD-10-CM | POA: Diagnosis not present

## 2016-11-08 DIAGNOSIS — R946 Abnormal results of thyroid function studies: Secondary | ICD-10-CM | POA: Diagnosis not present

## 2016-11-08 DIAGNOSIS — R0602 Shortness of breath: Secondary | ICD-10-CM

## 2016-11-08 DIAGNOSIS — R0789 Other chest pain: Secondary | ICD-10-CM

## 2016-11-08 DIAGNOSIS — K5900 Constipation, unspecified: Secondary | ICD-10-CM | POA: Insufficient documentation

## 2016-11-08 DIAGNOSIS — Z720 Tobacco use: Secondary | ICD-10-CM | POA: Insufficient documentation

## 2016-11-08 DIAGNOSIS — R7989 Other specified abnormal findings of blood chemistry: Secondary | ICD-10-CM

## 2016-11-08 DIAGNOSIS — J449 Chronic obstructive pulmonary disease, unspecified: Secondary | ICD-10-CM | POA: Insufficient documentation

## 2016-11-08 DIAGNOSIS — I10 Essential (primary) hypertension: Secondary | ICD-10-CM | POA: Diagnosis not present

## 2016-11-08 DIAGNOSIS — F17210 Nicotine dependence, cigarettes, uncomplicated: Secondary | ICD-10-CM

## 2016-11-08 DIAGNOSIS — J439 Emphysema, unspecified: Secondary | ICD-10-CM

## 2016-11-08 LAB — HEMOGLOBIN A1C
HEMOGLOBIN A1C: 5.6 % (ref 4.8–5.6)
MEAN PLASMA GLUCOSE: 114 mg/dL

## 2016-11-08 MED ORDER — FAMOTIDINE 20 MG PO TABS: 20.0000 mg | ORAL_TABLET | Freq: Two times a day (BID) | ORAL | 0 refills | Status: DC | PRN

## 2016-11-08 MED ORDER — OMEPRAZOLE 40 MG PO CPDR: 40.0000 mg | DELAYED_RELEASE_CAPSULE | Freq: Every day | ORAL | 0 refills | Status: DC

## 2016-11-08 MED ORDER — ATORVASTATIN CALCIUM 40 MG PO TABS: 40.0000 mg | ORAL_TABLET | Freq: Every day | ORAL | 3 refills | Status: DC

## 2016-11-08 MED ORDER — NICOTINE 14 MG/24HR TD PT24: 14.0000 mg | MEDICATED_PATCH | Freq: Every day | TRANSDERMAL | 0 refills | Status: DC

## 2016-11-08 MED ORDER — POLYETHYLENE GLYCOL 3350 17 G PO PACK: 17.0000 g | PACK | Freq: Every day | ORAL | 0 refills | Status: DC | PRN

## 2016-11-08 MED ORDER — NICOTINE POLACRILEX 2 MG MT GUM: 2.0000 mg | CHEWING_GUM | OROMUCOSAL | 2 refills | Status: DC | PRN

## 2016-11-08 NOTE — Progress Notes (Signed)
   CC: Patient is here for a hospital follow-up of chest pain. Elevated TSH, constipation, tobacco use, and GERD were also discussed during this visit.  HPI:  Ms.Jaci E Steve RattlerMinder is a 61 y.o. female with a past medical history of conditions listed below presenting to the clinic for a hospital follow-up of chest pain. Elevated TSH, constipation, tobacco use, GERD were also discussed during this visit. Please see problem based charting for the status of the patient's current and chronic medical conditions.   Past Medical History:  Diagnosis Date  . Arthritis   . Asthma   . Chest pain 2006  . Chest pain 10/2016  . Chronic back pain   . Hypertension   . Sciatica   . Shortness of breath    due to  smoking     Review of Systems: Pertinent positives mentioned in HPI. Remainder of all ROS negative.   Physical Exam:  Vitals:   11/08/16 1342  BP: 125/76  Pulse: 84  Temp: 97.1 F (36.2 C)  TempSrc: Oral  SpO2: 100%  Weight: 145 lb 9.6 oz (66 kg)   Physical Exam  Constitutional: She is oriented to person, place, and time. She appears well-developed and well-nourished. No distress.  HENT:  Head: Normocephalic and atraumatic.  Eyes: Right eye exhibits no discharge. Left eye exhibits no discharge.  Cardiovascular: Normal rate, regular rhythm and intact distal pulses.   Pulmonary/Chest: Effort normal and breath sounds normal. No respiratory distress. She has no wheezes. She has no rales.  Abdominal: Soft. Bowel sounds are normal. She exhibits no distension. There is no rebound and no guarding.  Epigastrium tender to palpation.  Musculoskeletal: She exhibits no edema.  Neurological: She is alert and oriented to person, place, and time.  Skin: Skin is warm and dry.    Assessment & Plan:   See Encounters Tab for problem based charting.  Patient discussed with Dr. Cyndie ChimeGranfortuna

## 2016-11-08 NOTE — Assessment & Plan Note (Addendum)
Assessment Patient reports having wheezing at night and shortness of breath with exertion for the past 6 months. She is currently asymptomatic. Chest x-ray done during recent hospitalization showing hyperinflated lungs consistent with COPD.  Plan -PFTs have been ordered -Continue albuterol nebulizer and inhaler as needed  Addendum 11/20/2016: PFTs showing FEV1 67% pred pre-BD and 80% pred post-BD, FEV1/ FVC 73% pred pre-BD and 69% pred post-BD, TLC 209%, DLCO 45%. 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 Spoke to the patient over the phone. She reports smoking 1/2 ppd x 43 yrs. States she wheezes only at night but has not requited her SABA this entire past week. Does report having fevers, chills, and chest congestion. Denies having any cough.  -Patient has been advised to come into the clinic for further evaluation.  -Consider starting her on an anticholinergic inhaler in addition to the SABA if her symptoms worsen.

## 2016-11-08 NOTE — Assessment & Plan Note (Signed)
Assessment Patient reports having chronic constipation and having one bowel movement per week on average. Denies having any abdominal pain, nausea, or vomiting. TSH checked during recent hospitalization was elevated at 6.4. Bowel sounds present on exam.  Plan -Recheck thyroid studies -MiraLAX daily as needed -Metamucil -Stay hydrated and increase dietary fiber intake

## 2016-11-08 NOTE — Patient Instructions (Signed)
Ms. [name] it was nice meeting you today.  -Continue using albuterol as needed for shortness of breath/ wheezing  -Start taking Lipitor 40 mg once daily  -Take Miralax daily as needed for constipation. You may also try over-the-counter Metamucil.  -I want to congratulate you on deciding to stop smoking. Use nicotine patches and gum as instructed.  -Take omeprazole and Pepcid as instructed for heartburn  -Return for a follow-up visit in 4-6 weeks

## 2016-11-08 NOTE — Assessment & Plan Note (Addendum)
Assessment Patient noted to have an elevated TSH of 6.4 during her recent hospitalization. She reports having dry skin, hair loss, constipation, and fatigue.  Plan -Recheck TSH -Check free T4  Addendum 11/09/2016 at 5 PM: TSH 4.5 (upper limit of normal) and free T4 1.05 (normal).  -Repeat thyroid studies in 6 weeks -Tried calling the patient but could not reach her over the phone. Left a voicemail asking her to call the clinic back.

## 2016-11-08 NOTE — Discharge Summary (Signed)
Name: [name] MRN: [id number] DOB: 25-Jun-1955 61 y.o. PCP: System, Pcp Not In  Date of Admission: 11/07/2016  8:22 AM Date of Discharge: 11/08/2016 Attending Physician: No att. providers found  Patient left AGAINST MEDICAL ADVICE overnight  Discharge Diagnosis: 1. Atypical chest pain 2. COPD? 3. Hypothyroidism Principal Problem:   Atypical chest pain Active Problems:   HTN (hypertension)   Discharge Medications: Allergies as of 11/07/2016   No Known Allergies     Medication List    ASK your doctor about these medications   albuterol 108 (90 Base) MCG/ACT inhaler Commonly known as:  PROVENTIL HFA;VENTOLIN HFA Inhale 2 puffs into the lungs every 4 (four) hours as needed for wheezing or shortness of breath.   albuterol 108 (90 Base) MCG/ACT inhaler Commonly known as:  PROVENTIL HFA;VENTOLIN HFA Inhale 2 puffs into the lungs every 4 (four) hours as needed for wheezing or shortness of breath.   albuterol (5 MG/ML) 0.5% nebulizer solution Commonly known as:  PROVENTIL Take 0.5 mLs (2.5 mg total) by nebulization every 6 (six) hours as needed for wheezing or shortness of breath.   aspirin 81 MG chewable tablet Chew 1 tablet (81 mg total) by mouth daily.   baclofen 10 MG tablet Commonly known as:  LIORESAL Take 10 mg by mouth 3 (three) times daily as needed for muscle spasms.   enalapril-hydrochlorothiazide 10-25 MG tablet Commonly known as:  VASERETIC Take 1 tablet by mouth daily.   famotidine 20 MG tablet Commonly known as:  PEPCID Take 1 tablet (20 mg total) by mouth 2 (two) times daily.   ipratropium-albuterol 0.5-2.5 (3) MG/3ML Soln Commonly known as:  DUONEB Nebulize 1 vial for inhalation twice daily   methylPREDNISolone 4 MG Tbpk tablet Commonly known as:  MEDROL DOSEPAK Take 1-6 tablets by mouth as directed.   multivitamin with minerals Tabs tablet Take 1 tablet by mouth daily.   nicotine 14 mg/24hr patch Commonly known as:  NICODERM CQ Place  1 patch (14 mg total) onto the skin daily.   nicotine 7 mg/24hr patch Commonly known as:  NICODERM CQ - dosed in mg/24 hr Place 1 patch (7 mg total) onto the skin daily. Start this after finish 14 mg patches for 28 days   omeprazole 20 MG capsule Commonly known as:  PRILOSEC Take 1 capsule (20 mg total) by mouth daily.   oxyCODONE-acetaminophen 10-325 MG tablet Commonly known as:  PERCOCET Take 1 tablet by mouth every 4 (four) hours as needed for pain.   potassium chloride SA 20 MEQ tablet Commonly known as:  K-DUR,KLOR-CON Take 1 tablet (20 mEq total) by mouth 2 (two) times daily. For 10days then take qd.   tetrahydrozoline 0.05 % ophthalmic solution Place 1 drop into both eyes daily as needed (red eyes).   tiotropium 18 MCG inhalation capsule Commonly known as:  SPIRIVA HANDIHALER Place 1 capsule (18 mcg total) into inhaler and inhale 1 day or 1 dose.   vitamin B-12 500 MCG tablet Commonly known as:  CYANOCOBALAMIN Take 500 mcg by mouth daily.   zolpidem 10 MG tablet Commonly known as:  AMBIEN Take 10 mg by mouth at bedtime as needed for sleep.       Disposition and follow-up:   [name] [facility].  At the hospital follow up visit please address:  1.  Please ensure the patient also with primary care.  2.  Labs / imaging needed at time of follow-up:  Repeat TSH and free T4  3.  Pending labs/ test needing follow-up: Lipid panel, hemoglobin A1c  Follow-up Appointments:  patient left [facility]  Hospital Course by problem list: Principal Problem:   Atypical chest pain Active Problems:   HTN (hypertension)   1. Atypical chest pain Patient presented with intermittent chest pain for over a year duration. Chest pain is described as a pressure in the center chest. Sometimes it is associated with diaphoresis, nausea and vomiting. Other times it is not. Nothing new about quality today that made her come to the  emergency department. Prior stress test low risk. EKG reassuring. Laboratory evaluation unremarkable. Negative i-STAT troponin. Chest x-ray normal. Patient's risk factors include hypertension and smoking. Chest pain story seems atypical to me. Pain elicited to palpation of the sternum. Heart score 3-4. Troponins collected while inpatient were negative. Unfortunately, the patient left AGAINST MEDICAL ADVICE and the workup was not complete.  2. COPD? Patient reports a history of dyspnea and cough. Chest radiograph shows hyperinflation consistent with COPD. This is undiagnosed. She will need follow-up with primary care and pulmonary function testing. I do think that this may actually be the true cause of her chest tightness over the past year. I'll attempt to call her today to schedule an appointment in clinic.  3. Hypothyroidism TSH checked which was elevated. Repeat TSH and free T4 unable to be collected. She does have some symptoms of hypothyroidism. She will need these rechecked in the outpatient setting.  Discharge Vitals:   BP 111/67 (BP Location: Right Arm)   Pulse 64   Temp 98.4 F (36.9 C) (Oral)   Resp 18   Ht 5\' 1"  (1.549 m)   Wt 140 lb (63.5 kg)   SpO2 99%   BMI 26.45 kg/m   Discharge Instructions:  left AMA  Signed: Thomasene Lot, MD 11/08/2016, 11:12 AM   Pager: [phone]

## 2016-11-08 NOTE — Assessment & Plan Note (Signed)
History of present illness Patient was admitted to the hospital on 11/07/2016 for chest pain and left AMA the same day. Her chest pain was thought to be atypical in nature. She had tenderness on palpation of the sternum. EKG was reassuring. Troponin 2 negative. A1c 5.6. Lipid panel showing cholesterol 206, HDL 64, and LDL 119. At present, patient denies having any chest pain or shortness of breath.  Assessment Atypical chest pain. Patient does have risk factors for coronary artery disease including hypertension and tobacco use. Per cardiology documentation, she had a negative cath in 2006 and stress test done in August 2017 was low risk.  Plan -Start Lipitor 40 mg daily -Start nicotine patches and gum as patient is interested in quitting smoking.

## 2016-11-08 NOTE — Assessment & Plan Note (Signed)
Assessment Patient is a current smoker. Smokes 4 cigarettes per day and is interested in quitting.  Plan -NicoDerm 14 mg daily patches for 6 weeks plus nicotine gum as needed -Patient has been advised to return in 4-6 weeks. Prescribe NicoDerm 7 mg patch 2 weeks at her next visit.

## 2016-11-08 NOTE — Assessment & Plan Note (Signed)
Assessment Patient reports having GERD symptoms for over a year. States she was previously prescribed omeprazole and Pepcid during an ED visit which helped but she ran out of the medications a long time ago. Noted to have epigastric tenderness on exam. No nausea or vomiting. Her weight has been stable for the past 1 year.  Plan -Omeprazole 40 mg daily -Pepcid 20 mg twice daily as needed -If patient does not have good response to PPI therapy, consider referral to GI for an EGD to look for an infectious versus inflammatory cause of her symptoms.

## 2016-11-08 NOTE — Telephone Encounter (Signed)
I have attempted to call the patient to schedule an appointment for her to be seen in clinic. I have been unsuccessful in reaching her. I sent a message to the clinic staff to also recheck her to try to make an appointment.

## 2016-11-09 LAB — TSH: TSH: 4.55 u[IU]/mL — ABNORMAL HIGH (ref 0.450–4.500)

## 2016-11-09 LAB — T4, FREE: FREE T4: 1.05 ng/dL (ref 0.82–1.77)

## 2016-11-09 NOTE — Progress Notes (Signed)
Medicine attending: Medical history, presenting problems, physical findings, and medications, reviewed with resident physician Dr Vasu Rathore on the day of the patient visit and I concur with her evaluation and management plan. 

## 2016-11-10 ENCOUNTER — Telehealth: Payer: Self-pay | Admitting: Internal Medicine

## 2016-11-10 NOTE — Telephone Encounter (Signed)
Page received to after hours IMTS pager at about 1:45pm.  Returned call to patient at about 2pm.  There was no answer.  Left message with patient to return call if urgent and, if not, advised to return call Monday morning during normal business hours to reach someone directly.  Gwynn BurlyAndrew Dajia Gunnels, DO 11/10/2016, 2:07 PM PGY-2, [facility]

## 2016-11-10 NOTE — Telephone Encounter (Signed)
Received another page from patient at 3:23pm and returned call at 3:35 pm.  Again, she did not pick up her phone.  Left another message with patient to return call if urgent and, if not, advised to return call Monday morning during normal business hours to reach someone directly.

## 2016-11-20 ENCOUNTER — Ambulatory Visit (HOSPITAL_COMMUNITY)
Admission: RE | Admit: 2016-11-20 | Discharge: 2016-11-20 | Disposition: A | Discharge status: Home / Self Care | Payer: Managed Care, Other (non HMO) | Source: Ambulatory Visit | Attending: Internal Medicine | Admitting: Internal Medicine

## 2016-11-20 DIAGNOSIS — R0602 Shortness of breath: Secondary | ICD-10-CM | POA: Diagnosis present

## 2016-11-20 DIAGNOSIS — J449 Chronic obstructive pulmonary disease, unspecified: Secondary | ICD-10-CM | POA: Insufficient documentation

## 2016-11-20 LAB — PULMONARY FUNCTION TEST
DL/VA % pred: 53 %
DL/VA: 2.36 ml/min/mmHg/L
DLCO UNC: 9.28 ml/min/mmHg
DLCO unc % pred: 45 %
FEF 25-75 Post: 0.97 L/sec
FEF 25-75 Pre: 0.52 L/sec
FEF2575-%CHANGE-POST: 85 %
FEF2575-%PRED-POST: 55 %
FEF2575-%Pred-Pre: 29 %
FEV1-%CHANGE-POST: 18 %
FEV1-%PRED-PRE: 67 %
FEV1-%Pred-Post: 80 %
FEV1-POST: 1.4 L
FEV1-Pre: 1.18 L
FEV1FVC-%Change-Post: -4 %
FEV1FVC-%Pred-Pre: 73 %
FEV6-%Change-Post: 23 %
FEV6-%PRED-POST: 112 %
FEV6-%PRED-PRE: 91 %
FEV6-POST: 2.42 L
FEV6-PRE: 1.97 L
FEV6FVC-%CHANGE-POST: 0 %
FEV6FVC-%Pred-Post: 100 %
FEV6FVC-%Pred-Pre: 100 %
FVC-%CHANGE-POST: 24 %
FVC-%PRED-POST: 113 %
FVC-%Pred-Pre: 90 %
FVC-Post: 2.54 L
FVC-Pre: 2.04 L
POST FEV1/FVC RATIO: 55 %
POST FEV6/FVC RATIO: 96 %
PRE FEV6/FVC RATIO: 96 %
Pre FEV1/FVC ratio: 58 %
RV % pred: 384 %
RV: 7.31 L
TLC % pred: 209 %
TLC: 9.65 L

## 2016-11-20 MED ORDER — ALBUTEROL SULFATE (2.5 MG/3ML) 0.083% IN NEBU
2.5000 mg | INHALATION_SOLUTION | Freq: Once | RESPIRATORY_TRACT | Status: AC
Start: 2016-11-20 — End: 2016-11-20
  Administered 2016-11-20: 2.5 mg via RESPIRATORY_TRACT

## 2016-11-22 ENCOUNTER — Ambulatory Visit: Payer: Managed Care, Other (non HMO)

## 2016-11-23 ENCOUNTER — Telehealth: Payer: Self-pay

## 2016-11-23 NOTE — Telephone Encounter (Signed)
Left message for patient to call the office. I preparing referral for opthalmology noticed [facility]. Informed patient need to know who she is choosing for PCP before referral is sent out. Informed she can only have one PCP and must decide who she is going to see.

## 2016-12-11 ENCOUNTER — Ambulatory Visit: Payer: Self-pay | Admitting: Internal Medicine

## 2016-12-21 ENCOUNTER — Ambulatory Visit: Payer: Managed Care, Other (non HMO)

## 2016-12-28 ENCOUNTER — Ambulatory Visit (INDEPENDENT_AMBULATORY_CARE_PROVIDER_SITE_OTHER): Payer: Managed Care, Other (non HMO) | Admitting: Internal Medicine

## 2016-12-28 VITALS — BP 117/71 | HR 65 | Temp 98.3°F | Wt 141.6 lb

## 2016-12-28 DIAGNOSIS — R946 Abnormal results of thyroid function studies: Secondary | ICD-10-CM | POA: Diagnosis not present

## 2016-12-28 DIAGNOSIS — Z791 Long term (current) use of non-steroidal anti-inflammatories (NSAID): Secondary | ICD-10-CM

## 2016-12-28 DIAGNOSIS — M79602 Pain in left arm: Secondary | ICD-10-CM | POA: Diagnosis not present

## 2016-12-28 DIAGNOSIS — F17210 Nicotine dependence, cigarettes, uncomplicated: Secondary | ICD-10-CM | POA: Diagnosis not present

## 2016-12-28 DIAGNOSIS — Z720 Tobacco use: Secondary | ICD-10-CM

## 2016-12-28 DIAGNOSIS — Z79899 Other long term (current) drug therapy: Secondary | ICD-10-CM

## 2016-12-28 DIAGNOSIS — R7989 Other specified abnormal findings of blood chemistry: Secondary | ICD-10-CM

## 2016-12-28 MED ORDER — MELOXICAM 7.5 MG PO TABS: 7.5000 mg | ORAL_TABLET | Freq: Every day | ORAL | 0 refills | Status: DC

## 2016-12-28 MED ORDER — NICOTINE 7 MG/24HR TD PT24
7.0000 mg | MEDICATED_PATCH | Freq: Every day | TRANSDERMAL | 0 refills | Status: DC
Start: 2016-12-28 — End: 2017-06-25

## 2016-12-28 NOTE — Patient Instructions (Addendum)
Ms. Steve RattlerMinder it was nice seeing you today.  -Start taking meloxicam 7.5 mg daily  -I have refilled your nicotine patches  -Return for a follow-up in 3-4 weeks

## 2016-12-28 NOTE — Assessment & Plan Note (Addendum)
History of present illness Patient is presenting with greater than 2 week history of pain radiating from the left side of her neck to her left shoulder and down her left arm. Also reports having associated numbness and tingling in her hand and digits. Denies having any falls or trauma to her neck. States her pain is shooting in character and constant. It is 7-9 out of 10 in intensity and unchanged for the past 2 weeks. Pain is worse when laying on the left side at night. States she has tried over-the-counter Motrin and stopped taking it because it did not help. It is unclear from the history how frequently the patient was taking Motrin.  Assessment Her current presentation is likely in the setting of possible cervical radiculopathy and rotator cuff tendinitis. Ultrasound at this visit showing inflammation of the left subscapularis and supraspinatus tendons. There is concern for cervical radiculopathy as Spurling's test was positive and patient endorsed numbness and tingling in her hand and fingers. No upper motor neuron signs on exam to suggest spinal cord compression. No red flags such as fevers.   Plan -Mobic 7.5 mg daily to help with pain and inflammation associated with rotator cuff tendinitis -Return to the clinic in 3-4 weeks -If she is continues to be symptomatic at next visit, consider ordering an MRI of her cervical spine to assess for cervical nerve root impingement.

## 2016-12-28 NOTE — Progress Notes (Signed)
Internal Medicine Clinic Attending  I saw and evaluated the patient.  I personally confirmed the key portions of the history and exam documented by Dr. Rathore and I reviewed pertinent patient test results.  The assessment, diagnosis, and plan were formulated together and I agree with the documentation in the resident's note.  

## 2016-12-28 NOTE — Assessment & Plan Note (Signed)
Assessment TSH checked during previous visit was 4.5 (upper limit of normal) and free T4 1.05 (normal).  Plan -Repeat thyroid studies

## 2016-12-28 NOTE — Assessment & Plan Note (Signed)
Assessment Patient states she has cut down her smoking from one half pack per day to 6 cigarettes per day. She continues to use nicotine patches.  Plan -NicoDerm 7 mg patch daily -Reassess at next visit

## 2016-12-28 NOTE — Progress Notes (Signed)
   CC: Patient is complaining of pain, numbness, and tingling in her left arm.  HPI:  Ms.[name] is a 61 y.o. female with a past medical history of conditions listed below presenting to the clinic complaining of pain, numbness, and tingling in her left arm. Please see problem based charting for the status of the patient's current and chronic medical conditions.   Past Medical History:  Diagnosis Date  . Arthritis   . Asthma   . Chest pain 2006  . Chest pain 10/2016  . Chronic back pain   . Hypertension   . Sciatica   . Shortness of breath    due to  smoking    Review of Systems: Pertinent positives mentioned in HPI. Remainder of all ROS negative.   Physical Exam:  Vitals:   12/28/16 1028  BP: 117/71  Pulse: 65  Temp: 98.3 F (36.8 C)  TempSrc: Oral  SpO2: 99%  Weight: 141 lb 9.6 oz (64.2 kg)   Physical Exam  Constitutional: She is oriented to person, place, and time. She appears well-developed and well-nourished. No distress.  HENT:  Head: Normocephalic and atraumatic.  Eyes: Right eye exhibits no discharge. Left eye exhibits no discharge.  Neck: Normal range of motion.  Cardiovascular: Normal rate, regular rhythm and intact distal pulses.   Pulmonary/Chest: Effort normal and breath sounds normal. No respiratory distress. She has no wheezes. She has no rales.  Abdominal: Soft. Bowel sounds are normal. She exhibits no distension. There is no tenderness.  Musculoskeletal: She exhibits no edema.  Neck: Normal range of motion but patient experiences pain upon lateral flexion to the left and lateral rotation to the left. Left shoulder: Normal flexion and extension. Able to abduct arm beyond 90 but experiences pain. Left scapula tender to palpation. No tenderness on palpation of cervical spine. Spurling's test positive Grip strength 4 out of 5 in the left hand and 5 out of 5 in the right hand.  Neurological: She is alert and oriented to person, place, and time.    Left upper extremity: Biceps reflex 2+ and brachioradialis reflex 1+.   Skin: Skin is warm and dry.    Assessment & Plan:   See Encounters Tab for problem based charting.  Patient seen with Dr. Oswaldo DoneVincent

## 2017-01-24 ENCOUNTER — Other Ambulatory Visit: Payer: Self-pay | Admitting: Internal Medicine

## 2017-02-05 ENCOUNTER — Other Ambulatory Visit: Payer: Self-pay

## 2017-02-05 NOTE — Telephone Encounter (Signed)
enalapril-hydrochlorothiazide (VASERETIC) 10-25 MG tablet, refill request @ [facility] on [address]

## 2017-02-05 NOTE — Telephone Encounter (Signed)
Called pharm, she has refills on this med, they will call her and do a refill

## 2017-03-22 ENCOUNTER — Emergency Department (HOSPITAL_COMMUNITY)
Admission: EM | Admit: 2017-03-22 | Discharge: 2017-03-22 | Disposition: A | Discharge status: Home / Self Care | Payer: Managed Care, Other (non HMO) | Attending: Emergency Medicine | Admitting: Emergency Medicine

## 2017-03-22 ENCOUNTER — Encounter (HOSPITAL_COMMUNITY): Payer: Self-pay | Admitting: *Deleted

## 2017-03-22 DIAGNOSIS — M545 Low back pain: Secondary | ICD-10-CM | POA: Diagnosis present

## 2017-03-22 DIAGNOSIS — Z5321 Procedure and treatment not carried out due to patient leaving prior to being seen by health care provider: Secondary | ICD-10-CM | POA: Insufficient documentation

## 2017-03-22 NOTE — ED Notes (Signed)
Pt called x 2 for room. 

## 2017-03-22 NOTE — ED Triage Notes (Signed)
Pt complains of left upper leg and lower back pain since falling off of a step ladder last night. Pt denies loss of consciousness.

## 2017-03-27 NOTE — Addendum Note (Signed)
Addended by: John GiovanniATHORE, Ailee Pates on: 03/27/2017 09:15 AM   Modules accepted: Orders

## 2017-06-24 ENCOUNTER — Telehealth: Payer: Self-pay | Admitting: Internal Medicine

## 2017-06-25 MED ORDER — NICOTINE 7 MG/24HR TD PT24: 7.0000 mg | MEDICATED_PATCH | Freq: Every day | TRANSDERMAL | 0 refills | Status: AC

## 2017-06-26 NOTE — Telephone Encounter (Signed)
Patient has been assigned to Eulah PontNina Blum, MD.

## 2017-07-04 ENCOUNTER — Ambulatory Visit (HOSPITAL_COMMUNITY)
Admission: EM | Admit: 2017-07-04 | Discharge: 2017-07-04 | Disposition: A | Discharge status: Home / Self Care | Payer: Managed Care, Other (non HMO) | Attending: Family Medicine | Admitting: Family Medicine

## 2017-07-04 ENCOUNTER — Other Ambulatory Visit: Payer: Self-pay

## 2017-07-04 ENCOUNTER — Encounter (HOSPITAL_COMMUNITY): Payer: Self-pay | Admitting: Emergency Medicine

## 2017-07-04 DIAGNOSIS — R05 Cough: Secondary | ICD-10-CM | POA: Diagnosis not present

## 2017-07-04 DIAGNOSIS — R059 Cough, unspecified: Secondary | ICD-10-CM

## 2017-07-04 DIAGNOSIS — R062 Wheezing: Secondary | ICD-10-CM

## 2017-07-04 MED ORDER — ALBUTEROL SULFATE (5 MG/ML) 0.5% IN NEBU: 2.5000 mg | INHALATION_SOLUTION | Freq: Four times a day (QID) | RESPIRATORY_TRACT | 12 refills | Status: DC | PRN

## 2017-07-04 MED ORDER — METHYLPREDNISOLONE 4 MG PO TBPK: 4.0000 mg | ORAL_TABLET | ORAL | 0 refills | Status: DC

## 2017-07-04 MED ORDER — AZITHROMYCIN 250 MG PO TABS: 250.0000 mg | ORAL_TABLET | Freq: Every day | ORAL | 0 refills | Status: DC

## 2017-07-04 NOTE — ED Provider Notes (Signed)
[facility]   [id number] 07/04/17 Arrival Time: 1127  ASSESSMENT & PLAN:  1. Cough   2. Wheezing    Symptoms for three weeks. Nebulizer treatment needed: no.  Meds ordered this encounter  Medications  . methylPREDNISolone (MEDROL DOSEPAK) 4 MG TBPK tablet    Sig: Take 1-6 tablets (4-24 mg total) by mouth as directed.    Dispense:  21 tablet    Refill:  0  . azithromycin (ZITHROMAX) 250 MG tablet    Sig: Take 1 tablet (250 mg total) by mouth daily. Take first 2 tablets together, then 1 every day until finished.    Dispense:  6 tablet    Refill:  0  . albuterol (PROVENTIL) (5 MG/ML) 0.5% nebulizer solution    Sig: Take 0.5 mLs (2.5 mg total) by nebulization every 6 (six) hours as needed for wheezing or shortness of breath.    Dispense:  20 mL    Refill:  12   Asthma precautions given. OTC symptom care as needed. Will schedule f/u visit with PCP. ED if acute worsening. May f/u here as needed.  Reviewed expectations re: course of current medical issues. Questions answered. Outlined signs and symptoms indicating need for more acute intervention. Patient verbalized understanding. After Visit Summary given.  SUBJECTIVE: History from: patient.  [name] is a 62 y.o. female who presents with complaint of intermittent wheezing and dry cough. Triggers: change in weather and coughing. Onset gradual, approximately 3 weeks ago. Describes wheezing as mild when present but worse over the past couple of days. Fever: no. Overall normal PO intake without n/v. Sick contacts: no. Typically her asthma-like symptoms are well controlled. She is currently trying to quit smoking. Inhaler use: none. OTC treatment: cough medication without much relief.   Social History   Tobacco Use  Smoking Status Current Every Day Smoker  . Packs/day: 0.25  . Years: 30.00  . Pack years: 7.50  . Types: Cigarettes  Smokeless Tobacco Never Used  Tobacco Comment   Smoking since teenage  years    ROS: As per HPI.   OBJECTIVE:  Vitals:   07/04/17 1227  BP: (!) 110/59  Pulse: 79  Resp: (!) 24  Temp: 97.9 F (36.6 C)  TempSrc: Oral  SpO2: 100%    Recheck RR: 18 General appearance: alert; appears fatigued HEENT: nasal congestion; clear runny nose; throat irritation secondary to post-nasal drainage Neck: supple without LAD Lungs: unlabored respirations, mild bilateral wheezing; cough: mild; no significant respiratory distress Skin: warm and dry Psychological: alert and cooperative; normal mood and affect   No Known Allergies  Past Medical History:  Diagnosis Date  . Arthritis   . Asthma   . Chest pain 2006  . Chest pain 10/2016  . Chronic back pain   . Hypertension   . Sciatica   . Shortness of breath    due to  smoking    Family History  Problem Relation Age of Onset  . Heart attack Mother [id number]  . Hypertension Sister   . Diabetes type II Sister   . Tongue cancer Brother   . Heart attack Sister [id number]  . Lung cancer Sister   . Hypertension Sister    Social History   Socioeconomic History  . Marital status: Single    Spouse name: Not on file  . Number of children: Not on file  . Years of education: Not on file  . Highest education level: Not on file  Social Needs  . Physicist, medicalinancial resource  strain: Not on file  . Food insecurity - worry: Not on file  . Food insecurity - inability: Not on file  . Transportation needs - medical: Not on file  . Transportation needs - non-medical: Not on file  Occupational History  . Occupation: Retired  Tobacco Use  . Smoking status: Current Every Day Smoker    Packs/day: 0.25    Years: 30.00    Pack years: 7.50    Types: Cigarettes  . Smokeless tobacco: Never Used  . Tobacco comment: Smoking since teenage years  Substance and Sexual Activity  . Alcohol use: No  . Drug use: No  . Sexual activity: No  Other Topics Concern  . Not on file  Social History Narrative   Great-grandmother   Used to work as a  Corporate treasurer, now volunteers w/ free time at [employer], MD 07/08/17 [phone]

## 2017-07-04 NOTE — ED Triage Notes (Addendum)
Cold symptoms 3 weeks ago and did seem to resolve for a week.  Asthma off and on.  Reports asthma flare up last night and sob this morning.  Reports thick, brown phlegm.    Patient really wants antibiotics

## 2017-07-05 ENCOUNTER — Encounter: Payer: Self-pay | Admitting: Internal Medicine

## 2017-07-05 ENCOUNTER — Ambulatory Visit: Payer: Managed Care, Other (non HMO)

## 2017-07-09 ENCOUNTER — Encounter (HOSPITAL_COMMUNITY): Payer: Self-pay | Admitting: Emergency Medicine

## 2017-07-09 ENCOUNTER — Emergency Department (HOSPITAL_COMMUNITY): Payer: Managed Care, Other (non HMO)

## 2017-07-09 ENCOUNTER — Emergency Department (HOSPITAL_COMMUNITY)
Admission: EM | Admit: 2017-07-09 | Discharge: 2017-07-10 | Disposition: A | Discharge status: Home / Self Care | Payer: Managed Care, Other (non HMO) | Attending: Emergency Medicine | Admitting: Emergency Medicine

## 2017-07-09 ENCOUNTER — Other Ambulatory Visit: Payer: Self-pay

## 2017-07-09 ENCOUNTER — Emergency Department (HOSPITAL_COMMUNITY)
Admission: EM | Admit: 2017-07-09 | Discharge: 2017-07-09 | Disposition: A | Discharge status: Home / Self Care | Payer: Managed Care, Other (non HMO) | Source: Home / Self Care | Attending: Emergency Medicine | Admitting: Emergency Medicine

## 2017-07-09 DIAGNOSIS — F17210 Nicotine dependence, cigarettes, uncomplicated: Secondary | ICD-10-CM | POA: Diagnosis not present

## 2017-07-09 DIAGNOSIS — I10 Essential (primary) hypertension: Secondary | ICD-10-CM

## 2017-07-09 DIAGNOSIS — Z79899 Other long term (current) drug therapy: Secondary | ICD-10-CM

## 2017-07-09 DIAGNOSIS — J449 Chronic obstructive pulmonary disease, unspecified: Secondary | ICD-10-CM

## 2017-07-09 DIAGNOSIS — R5381 Other malaise: Secondary | ICD-10-CM

## 2017-07-09 DIAGNOSIS — R0602 Shortness of breath: Secondary | ICD-10-CM | POA: Insufficient documentation

## 2017-07-09 DIAGNOSIS — R05 Cough: Secondary | ICD-10-CM

## 2017-07-09 DIAGNOSIS — J45909 Unspecified asthma, uncomplicated: Secondary | ICD-10-CM | POA: Diagnosis not present

## 2017-07-09 DIAGNOSIS — R079 Chest pain, unspecified: Secondary | ICD-10-CM | POA: Diagnosis present

## 2017-07-09 DIAGNOSIS — J189 Pneumonia, unspecified organism: Secondary | ICD-10-CM

## 2017-07-09 DIAGNOSIS — B349 Viral infection, unspecified: Secondary | ICD-10-CM

## 2017-07-09 DIAGNOSIS — J101 Influenza due to other identified influenza virus with other respiratory manifestations: Secondary | ICD-10-CM | POA: Insufficient documentation

## 2017-07-09 DIAGNOSIS — J111 Influenza due to unidentified influenza virus with other respiratory manifestations: Secondary | ICD-10-CM

## 2017-07-09 DIAGNOSIS — R059 Cough, unspecified: Secondary | ICD-10-CM

## 2017-07-09 DIAGNOSIS — J181 Lobar pneumonia, unspecified organism: Principal | ICD-10-CM

## 2017-07-09 DIAGNOSIS — R112 Nausea with vomiting, unspecified: Secondary | ICD-10-CM

## 2017-07-09 LAB — CBC WITH DIFFERENTIAL/PLATELET
BASOS ABS: 0 10*3/uL (ref 0.0–0.1)
Basophils Relative: 0 %
EOS ABS: 0 10*3/uL (ref 0.0–0.7)
Eosinophils Relative: 0 %
HEMATOCRIT: 35.6 % — AB (ref 36.0–46.0)
HEMOGLOBIN: 11.4 g/dL — AB (ref 12.0–15.0)
Lymphocytes Relative: 14 %
Lymphs Abs: 0.8 10*3/uL (ref 0.7–4.0)
MCH: 29.2 pg (ref 26.0–34.0)
MCHC: 32 g/dL (ref 30.0–36.0)
MCV: 91.3 fL (ref 78.0–100.0)
MONOS PCT: 9 %
Monocytes Absolute: 0.5 10*3/uL (ref 0.1–1.0)
NEUTROS ABS: 4.1 10*3/uL (ref 1.7–7.7)
NEUTROS PCT: 77 %
Platelets: 286 10*3/uL (ref 150–400)
RBC: 3.9 MIL/uL (ref 3.87–5.11)
RDW: 14.7 % (ref 11.5–15.5)
WBC: 5.4 10*3/uL (ref 4.0–10.5)

## 2017-07-09 LAB — BASIC METABOLIC PANEL
ANION GAP: 11 (ref 5–15)
BUN: 7 mg/dL (ref 6–20)
CALCIUM: 8.4 mg/dL — AB (ref 8.9–10.3)
CO2: 24 mmol/L (ref 22–32)
Chloride: 105 mmol/L (ref 101–111)
Creatinine, Ser: 0.8 mg/dL (ref 0.44–1.00)
Glucose, Bld: 101 mg/dL — ABNORMAL HIGH (ref 65–99)
POTASSIUM: 3.4 mmol/L — AB (ref 3.5–5.1)
SODIUM: 140 mmol/L (ref 135–145)

## 2017-07-09 LAB — I-STAT CG4 LACTIC ACID, ED
LACTIC ACID, VENOUS: 1.11 mmol/L (ref 0.5–1.9)
Lactic Acid, Venous: 0.77 mmol/L (ref 0.5–1.9)

## 2017-07-09 LAB — CBC
HCT: 37.2 % (ref 36.0–46.0)
HEMOGLOBIN: 11.8 g/dL — AB (ref 12.0–15.0)
MCH: 29.1 pg (ref 26.0–34.0)
MCHC: 31.7 g/dL (ref 30.0–36.0)
MCV: 91.9 fL (ref 78.0–100.0)
Platelets: 268 10*3/uL (ref 150–400)
RBC: 4.05 MIL/uL (ref 3.87–5.11)
RDW: 14.7 % (ref 11.5–15.5)
WBC: 4.8 10*3/uL (ref 4.0–10.5)

## 2017-07-09 LAB — COMPREHENSIVE METABOLIC PANEL
ALBUMIN: 3.4 g/dL — AB (ref 3.5–5.0)
ALT: 9 U/L — ABNORMAL LOW (ref 14–54)
ANION GAP: 10 (ref 5–15)
AST: 18 U/L (ref 15–41)
Alkaline Phosphatase: 55 U/L (ref 38–126)
BILIRUBIN TOTAL: 0.5 mg/dL (ref 0.3–1.2)
BUN: 9 mg/dL (ref 6–20)
CALCIUM: 8.7 mg/dL — AB (ref 8.9–10.3)
CO2: 26 mmol/L (ref 22–32)
Chloride: 104 mmol/L (ref 101–111)
Creatinine, Ser: 0.72 mg/dL (ref 0.44–1.00)
GFR calc non Af Amer: >60 mL/min (ref >60)
GLUCOSE: 100 mg/dL — AB (ref 65–99)
POTASSIUM: 3.1 mmol/L — AB (ref 3.5–5.1)
Sodium: 140 mmol/L (ref 135–145)
TOTAL PROTEIN: 5.6 g/dL — AB (ref 6.5–8.1)

## 2017-07-09 LAB — I-STAT TROPONIN, ED: TROPONIN I, POC: 0 ng/mL (ref 0.00–0.08)

## 2017-07-09 LAB — PROTIME-INR
INR: 1.05
PROTHROMBIN TIME: 13.6 s (ref 11.4–15.2)

## 2017-07-09 MED ORDER — ONDANSETRON HCL 4 MG/2ML IJ SOLN
4.0000 mg | Freq: Once | INTRAMUSCULAR | Status: AC
  Administered 2017-07-09: 4 mg via INTRAVENOUS
  Filled 2017-07-09: qty 2

## 2017-07-09 MED ORDER — SODIUM CHLORIDE 0.9 % IV BOLUS (SEPSIS)
1000.0000 mL | Freq: Once | INTRAVENOUS | Status: AC
  Administered 2017-07-09: 1000 mL via INTRAVENOUS

## 2017-07-09 MED ORDER — DOXYCYCLINE HYCLATE 100 MG PO CAPS: 100.0000 mg | ORAL_CAPSULE | Freq: Two times a day (BID) | ORAL | 0 refills | Status: AC

## 2017-07-09 MED ORDER — METHYLPREDNISOLONE SODIUM SUCC 125 MG IJ SOLR
125.0000 mg | Freq: Once | INTRAMUSCULAR | Status: AC
  Administered 2017-07-09: 125 mg via INTRAVENOUS
  Filled 2017-07-09: qty 2

## 2017-07-09 MED ORDER — IPRATROPIUM-ALBUTEROL 0.5-2.5 (3) MG/3ML IN SOLN
3.0000 mL | Freq: Once | RESPIRATORY_TRACT | Status: AC
  Administered 2017-07-09: 3 mL via RESPIRATORY_TRACT
  Filled 2017-07-09: qty 3

## 2017-07-09 MED ORDER — DOXYCYCLINE HYCLATE 100 MG PO TABS
100.0000 mg | ORAL_TABLET | Freq: Once | ORAL | Status: AC
  Administered 2017-07-09: 100 mg via ORAL
  Filled 2017-07-09: qty 1

## 2017-07-09 NOTE — ED Notes (Signed)
Pt family member comes to nursing station and ask about camera in pt room. Family member is assured camera is not working at this time. Camera is for a different type of patient that was housed in this unit.

## 2017-07-09 NOTE — ED Triage Notes (Signed)
Per EMS pt from home was discharged today for pneumonia, pt got home this afternoon increased chest pain and SOB, took doxycycline and threw it up.

## 2017-07-09 NOTE — ED Notes (Signed)
Pt stable, ambulatory, and verbalizes understanding of d/c instructions.  

## 2017-07-09 NOTE — ED Notes (Signed)
Patient transported to X-ray 

## 2017-07-09 NOTE — ED Provider Notes (Signed)
[facility] EMERGENCY DEPARTMENT Provider Note  CSN: [id number] Arrival date & time: 07/09/17 4098  Chief Complaint(s) Flu-like symptoms  HPI [name] is a 63 y.o. female   The history is provided by the patient.  Cough  This is a new problem. Episode onset: 4 weeks. The problem occurs every few minutes. The problem has been gradually worsening. The cough is productive of purulent sputum. Maximum temperature: subjective. Associated symptoms include chills, rhinorrhea, myalgias, shortness of breath and wheezing. Treatments tried: was treated with zpak and steroid 1 week ago. She is a smoker. Her past medical history is significant for COPD and asthma. Her past medical history does not include pneumonia.    Past Medical History Past Medical History:  Diagnosis Date  . Arthritis   . Asthma   . Chest pain 2006  . Chest pain 10/2016  . Chronic back pain   . Hypertension   . Sciatica   . Shortness of breath    due to  smoking    Patient Active Problem List   Diagnosis Date Noted  . Pain of left upper extremity 12/28/2016  . Elevated TSH 11/08/2016  . Gastroesophageal reflux disease 11/08/2016  . Constipation 11/08/2016  . COPD (chronic obstructive pulmonary disease) (HCC) 11/08/2016  . Tobacco use 11/08/2016  . Atypical chest pain 11/07/2016  . Unstable angina (HCC) 01/07/2016  . HTN (hypertension) 01/07/2016  . Hypokalemia 11/06/2013  . Acute blood loss anemia 11/06/2013  . Osteoarthritis of left hip 11/05/2013  . 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 Authorizing Provider  albuterol (PROVENTIL HFA;VENTOLIN HFA) 108 (90 BASE) MCG/ACT inhaler Inhale 2 puffs into the lungs every 4 (four) hours as needed for wheezing or shortness of breath. 09/25/14   Eber Hong, MD   albuterol (PROVENTIL) (5 MG/ML) 0.5% nebulizer solution Take 0.5 mLs (2.5 mg total) by nebulization every 6 (six) hours as needed for wheezing or shortness of breath. 07/04/17   Mardella Layman, MD  azithromycin (ZITHROMAX) 250 MG tablet Take 1 tablet (250 mg total) by mouth daily. Take first 2 tablets together, then 1 every day until finished. 07/04/17   Mardella Layman, MD  doxycycline (VIBRAMYCIN) 100 MG capsule Take 1 capsule (100 mg total) by mouth 2 (two) times daily for 7 days. 07/09/17 07/16/17  Nira Conn, MD  enalapril-hydrochlorothiazide (VASERETIC) 10-25 MG tablet Take 1 tablet by mouth daily. 11/05/16   Julieanne Manson, MD  ipratropium-albuterol (DUONEB) 0.5-2.5 (3) MG/3ML SOLN Nebulize 1 vial for inhalation twice daily Patient not taking: Reported on 08/20/2016 06/18/16   Julieanne Manson, MD  meloxicam (MOBIC) 7.5 MG tablet TAKE 1 TABLET BY MOUTH EVERY DAY 01/25/17   John Giovanni, MD  methylPREDNISolone (MEDROL DOSEPAK) 4 MG TBPK tablet Take 1-6 tablets (4-24 mg total) by mouth as directed. 07/04/17   Mardella Layman, MD  Multiple Vitamin (MULTIVITAMIN WITH MINERALS) TABS tablet Take 1 tablet by mouth daily.    [provider]  nicotine (NICODERM CQ - DOSED IN MG/24 HR) 7 mg/24hr patch Place 1 patch (7 mg total) onto the skin daily. 06/25/17 06/25/18  John Giovanni, MD  nicotine polacrilex (RA NICOTINE GUM) 2 MG gum Take 1 each (2 mg total) by mouth as needed for smoking cessation. Maximum 24 pieces of gum per day. 11/08/16   John Giovanni, MD  omeprazole (PRILOSEC)  40 MG capsule Take 1 capsule (40 mg total) by mouth daily. 11/08/16   John Giovanni, MD  oxyCODONE-acetaminophen (PERCOCET) 10-325 MG tablet Take 1 tablet by mouth every 4 (four) hours as needed for pain.    [provider]  polyethylene glycol (MIRALAX) packet Take 17 g by mouth daily as needed. 11/08/16   John Giovanni, MD  potassium chloride SA (K-DUR,KLOR-CON) 20 MEQ tablet Take 1  tablet (20 mEq total) by mouth 2 (two) times daily. For 10days then take qd. Patient taking differently: Take 20 mEq by mouth daily as needed (if not feeling good).  12/04/15   Linna Hoff, MD  tetrahydrozoline 0.05 % ophthalmic solution Place 1 drop into both eyes daily as needed (red eyes).    [provider]  vitamin B-12 (CYANOCOBALAMIN) 500 MCG tablet Take 500 mcg by mouth daily.    [provider]  zolpidem (AMBIEN) 10 MG tablet Take 10 mg by mouth at bedtime as needed for sleep.    [provider]                                                                                                                                    Past Surgical History Past Surgical History:  Procedure Laterality Date  . ABDOMINAL HYSTERECTOMY  1997  . CARDIAC CATHETERIZATION  2006  . HEMI-MICRODISCECTOMY LUMBAR LAMINECTOMY LEVEL 1  05/14/2012   Procedure: HEMI-MICRODISCECTOMY LUMBAR LAMINECTOMY LEVEL 1;  Surgeon: Jacki Cones, MD;  Location: [facility];  Service: Orthopedics;  Laterality: N/A;  Hemi Laminectomy Microdiscectomy L4 - L5 Central (X-Ray)  . ingrown toenail removal    . TONSILLECTOMY    . TOTAL HIP ARTHROPLASTY Left 11/05/2013   Procedure: LEFT TOTAL HIP ARTHROPLASTY;  Surgeon: Jacki Cones, MD;  Location: [facility];  Service: Orthopedics;  Laterality: Left;   Family History Family History  Problem Relation Age of Onset  . Heart attack Mother 45  . Hypertension Sister   . Diabetes type II Sister   . Tongue cancer Brother   . Heart attack Sister 15  . Lung cancer Sister   . Hypertension Sister     Social History Social History   Tobacco Use  . Smoking status: Current Every Day Smoker    Packs/day: 0.25    Years: 30.00    Pack years: 7.50    Types: Cigarettes  . Smokeless tobacco: Never Used  . Tobacco comment: Smoking since teenage years  Substance Use Topics  . Alcohol use: No  . Drug use: No   Allergies Patient has no known allergies.  Review  of Systems Review of Systems  Constitutional: Positive for chills.  HENT: Positive for rhinorrhea.   Respiratory: Positive for cough, shortness of breath and wheezing.   Gastrointestinal: Positive for nausea and vomiting (NBNB. decreased PO tolerance). Negative for diarrhea.  Musculoskeletal: Positive for myalgias.   All other systems are reviewed and are  negative for acute change except as noted in the HPI  Physical Exam Vital Signs  I have reviewed the triage vital signs Temp 98.8 F (37.1 C) (Oral)   Ht 5\' 1"  (1.549 m)   Wt 63.5 kg (140 lb)   SpO2 100%   BMI 26.45 kg/m   Physical Exam  Constitutional: She is oriented to person, place, and time. She appears well-developed and well-nourished. No distress.  HENT:  Head: Normocephalic and atraumatic.  Nose: Nose normal.  Eyes: Conjunctivae and EOM are normal. Pupils are equal, round, and reactive to light. Right eye exhibits no discharge. Left eye exhibits no discharge. No scleral icterus.  Neck: Normal range of motion. Neck supple.  Cardiovascular: Normal rate and regular rhythm. Exam reveals no gallop and no friction rub.  No murmur heard. Pulmonary/Chest: Effort normal. No stridor. No respiratory distress. She has wheezes (faint exp) in the left upper field and the left middle field. She has no rales.  Abdominal: Soft. She exhibits no distension. There is no tenderness.  Musculoskeletal: She exhibits no edema or tenderness.  Neurological: She is alert and oriented to person, place, and time.  Skin: Skin is warm and dry. No rash noted. She is not diaphoretic. No erythema.  Psychiatric: She has a normal mood and affect.  Vitals reviewed.   ED Results and Treatments Labs (all labs ordered are listed, but only abnormal results are displayed) Labs Reviewed  COMPREHENSIVE METABOLIC PANEL - Abnormal; Notable for the following components:      Result Value   Potassium 3.1 (*)    Glucose, Bld 100 (*)    Calcium 8.7 (*)     Total Protein 5.6 (*)    Albumin 3.4 (*)    ALT 9 (*)    All other components within normal limits  CBC WITH DIFFERENTIAL/PLATELET - Abnormal; Notable for the following components:   Hemoglobin 11.4 (*)    HCT 35.6 (*)    All other components within normal limits  PROTIME-INR  URINALYSIS, ROUTINE W REFLEX MICROSCOPIC  I-STAT CG4 LACTIC ACID, ED  I-STAT CG4 LACTIC ACID, ED                                                                                                                         EKG  EKG Interpretation  Date/Time:    Ventricular Rate:    PR Interval:    QRS Duration:   QT Interval:    QTC Calculation:   R Axis:     Text Interpretation:        Radiology Dg Chest 2 View  Result Date: 07/09/2017 CLINICAL DATA:  Cough, fever, weakness, with nausea and vomiting for the past 3 weeks. Symptoms had initially improved but have now worsened. History of asthma, long-term smoker. EXAM: CHEST  2 VIEW COMPARISON:  Chest x-ray of November 07, 2016 FINDINGS: The lungs are mildly hyperinflated with hemidiaphragm flattening. There is subtle increased density that projects in the left lower lobe posterior  medially. The heart and pulmonary vascularity are normal. The mediastinum is normal in width. There is mild multilevel degenerative disc disease of the thoracic spine. IMPRESSION: Chronic bronchitic changes. Superimposed subsegmental atelectasis or pneumonia in the left lower lobe. Followup PA and lateral chest X-ray is recommended in 3-4 weeks following trial of antibiotic therapy to ensure resolution and exclude underlying malignancy. Electronically Signed   By: David  Swazil[place]Jordan M.D.   On: 07/09/2017 10:13   Pertinent labs & imaging results that were available during my care of the patient were reviewed by me and considered in my medical decision making (see chart for details).  Medications Ordered in ED Medications  sodium chloride 0.9 % bolus 1,000 mL (0 mLs Intravenous Stopped 07/09/17  1207)  sodium chloride 0.9 % bolus 1,000 mL (1,000 mLs Intravenous New Bag/Given 07/09/17 1206)  ondansetron (ZOFRAN) injection 4 mg (4 mg Intravenous Given 07/09/17 1206)  ipratropium-albuterol (DUONEB) 0.5-2.5 (3) MG/3ML nebulizer solution 3 mL (3 mLs Nebulization Given 07/09/17 1153)  doxycycline (VIBRA-TABS) tablet 100 mg (100 mg Oral Given 07/09/17 1206)                                                                                                                                    Procedures Procedures  (including critical care time)  Medical Decision Making / ED Course I have reviewed the nursing notes for this encounter and the patient's prior records (if available in EHR or on provided paperwork).    Patient here with persistent cough following what sounds like a viral process.  Lungs with focal wheezing and chest x-ray concerning for left lower lobe pneumonia consistent with the patient's wheezing location.  Patient is afebrile with stable vital signs.  Does not appear septic.  Will treat for community acquired pneumonia with doxycycline.  The patient appears reasonably screened and/or stabilized for discharge and I doubt any other medical condition or other [facility] further screening, evaluation, or treatment in the ED at this time prior to discharge.  The patient is safe for discharge with strict return precautions.   Final Clinical Impression(s) / ED Diagnoses Final diagnoses:  Community acquired pneumonia of left lower lobe of lung (HCC)    Disposition: Discharge  Condition: Good  I have discussed the results, Dx and Tx plan with the patient who expressed understanding and agree(s) with the plan. Discharge instructions discussed at great length. The patient was given strict return precautions who verbalized understanding of the instructions. No further questions at time of discharge.    ED Discharge Orders        Ordered    doxycycline (VIBRAMYCIN) 100 MG capsule   2 times daily     07/09/17 1307       Follow Up: Primary care provider  Schedule an appointment as soon as possible for a visit  in 5-7 days, If symptoms do not improve or  worsen  This chart was dictated using voice recognition software.  Despite best efforts to proofread,  errors can occur which can change the documentation meaning.   Nira Conn, MD 07/09/17 1309

## 2017-07-09 NOTE — ED Triage Notes (Signed)
Pt in from home via [facility] with c/o sudden aches, n/v and flu-like symptoms last night. States she has vomited x 4 in 24h. Temp PTA 48F, given 1g Tylenol and 4mg  Zofran. Hx asthma, HTN. States coughing up brown phlegm.

## 2017-07-09 NOTE — ED Notes (Signed)
ED Provider at bedside. 

## 2017-07-10 LAB — URINALYSIS, ROUTINE W REFLEX MICROSCOPIC
Bilirubin Urine: NEGATIVE
GLUCOSE, UA: NEGATIVE mg/dL
Hgb urine dipstick: NEGATIVE
KETONES UR: NEGATIVE mg/dL
LEUKOCYTES UA: NEGATIVE
Nitrite: NEGATIVE
PH: 5 (ref 5.0–8.0)
Protein, ur: NEGATIVE mg/dL
Specific Gravity, Urine: 1.008 (ref 1.005–1.030)

## 2017-07-10 LAB — INFLUENZA PANEL BY PCR (TYPE A & B)
INFLBPCR: NEGATIVE
Influenza A By PCR: POSITIVE — AB

## 2017-07-10 MED ORDER — OSELTAMIVIR PHOSPHATE 75 MG PO CAPS: 75.0000 mg | ORAL_CAPSULE | Freq: Two times a day (BID) | ORAL | 0 refills | Status: DC

## 2017-07-10 MED ORDER — ONDANSETRON HCL 4 MG PO TABS: 4.0000 mg | ORAL_TABLET | Freq: Three times a day (TID) | ORAL | 0 refills | Status: DC | PRN

## 2017-07-10 NOTE — Discharge Instructions (Signed)
Your workup today shows evidence of influenza.  We did not find evidence of pneumonia on repeat imaging.  Please stop taking the antibiotic and instead use the nausea medicine and Tamiflu.  Please stay hydrated and follow-up with a primary care physician in several days.  If any symptoms change or worsen, please return to the nearest emergency department.

## 2017-07-10 NOTE — ED Provider Notes (Signed)
[facility] EMERGENCY DEPARTMENT Provider Note   CSN: [id number] Arrival date & time: 07/09/17  2027     History   Chief Complaint Chief Complaint  Patient presents with  . Chest Pain    HPI [name] is a 62 y.o. female.  The history is provided by the patient, a relative and medical records.  URI   This is a new problem. The current episode started more than 2 days ago. The problem has been gradually worsening. The maximum temperature recorded prior to her arrival was 100 to 100.9 F. The fever has been present for 1 to 2 days. Associated symptoms include chest pain, nausea, vomiting, congestion, rhinorrhea, sneezing and cough. Pertinent negatives include no abdominal pain, no diarrhea, no dysuria, no headaches, no neck pain, no rash and no wheezing. She has tried nothing for the symptoms. The treatment provided no relief.    Past Medical History:  Diagnosis Date  . Arthritis   . Asthma   . Chest pain 2006  . Chest pain 10/2016  . Chronic back pain   . Hypertension   . Sciatica   . Shortness of breath    due to  smoking     Patient Active Problem List   Diagnosis Date Noted  . Pain of left upper extremity 12/28/2016  . Elevated TSH 11/08/2016  . Gastroesophageal reflux disease 11/08/2016  . Constipation 11/08/2016  . COPD (chronic obstructive pulmonary disease) (HCC) 11/08/2016  . Tobacco use 11/08/2016  . Atypical chest pain 11/07/2016  . Unstable angina (HCC) 01/07/2016  . HTN (hypertension) 01/07/2016  . Hypokalemia 11/06/2013  . Acute blood loss anemia 11/06/2013  . Osteoarthritis of left hip 11/05/2013  . 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 ABDOMINAL HYSTERECTOMY  1997  . CARDIAC CATHETERIZATION  2006  . HEMI-MICRODISCECTOMY LUMBAR LAMINECTOMY LEVEL 1  05/14/2012   Procedure:  HEMI-MICRODISCECTOMY LUMBAR LAMINECTOMY LEVEL 1;  Surgeon: Jacki Cones, MD;  Location: [facility];  Service: Orthopedics;  Laterality: N/A;  Hemi Laminectomy Microdiscectomy L4 - L5 Central (X-Ray)  . ingrown toenail removal    . TONSILLECTOMY    . TOTAL HIP ARTHROPLASTY Left 11/05/2013   Procedure: LEFT TOTAL HIP ARTHROPLASTY;  Surgeon: Jacki Cones, MD;  Location: [facility];  Service: Orthopedics;  Laterality: Left;    OB History    Gravida Para Term Preterm AB Living   3 3           SAB TAB Ectopic Multiple Live Births                   Home Medications    Prior to Admission medications   Medication Sig Start Date End Date Taking? Authorizing Provider  albuterol (PROVENTIL HFA;VENTOLIN HFA) 108 (90 BASE) MCG/ACT inhaler Inhale 2 puffs into the lungs every 4 (four) hours as needed for wheezing or shortness of breath. 09/25/14   Eber Hong, MD  albuterol (PROVENTIL) (5 MG/ML) 0.5% nebulizer solution Take 0.5 mLs (2.5 mg total) by nebulization every 6 (six) hours as needed for wheezing or shortness of breath. 07/04/17   Mardella Layman, MD  azithromycin (ZITHROMAX) 250 MG tablet Take 1 tablet (250 mg total) by mouth daily. Take first 2 tablets together, then 1 every day until finished. 07/04/17   Mardella Layman, MD  doxycycline (VIBRAMYCIN) 100 MG capsule Take 1 capsule (  100 mg total) by mouth 2 (two) times daily for 7 days. 07/09/17 07/16/17  Nira Conn, MD  enalapril-hydrochlorothiazide (VASERETIC) 10-25 MG tablet Take 1 tablet by mouth daily. 11/05/16   Julieanne Manson, MD  ipratropium-albuterol (DUONEB) 0.5-2.5 (3) MG/3ML SOLN Nebulize 1 vial for inhalation twice daily Patient not taking: Reported on 08/20/2016 06/18/16   Julieanne Manson, MD  meloxicam (MOBIC) 7.5 MG tablet TAKE 1 TABLET BY MOUTH EVERY DAY 01/25/17   John Giovanni, MD  methylPREDNISolone (MEDROL DOSEPAK) 4 MG TBPK tablet Take 1-6 tablets (4-24 mg total) by mouth as directed. 07/04/17   Mardella Layman, MD    Multiple Vitamin (MULTIVITAMIN WITH MINERALS) TABS tablet Take 1 tablet by mouth daily.    [provider]  nicotine (NICODERM CQ - DOSED IN MG/24 HR) 7 mg/24hr patch Place 1 patch (7 mg total) onto the skin daily. 06/25/17 06/25/18  John Giovanni, MD  nicotine polacrilex (RA NICOTINE GUM) 2 MG gum Take 1 each (2 mg total) by mouth as needed for smoking cessation. Maximum 24 pieces of gum per day. 11/08/16   John Giovanni, MD  omeprazole (PRILOSEC) 40 MG capsule Take 1 capsule (40 mg total) by mouth daily. 11/08/16   John Giovanni, MD  oxyCODONE-acetaminophen (PERCOCET) 10-325 MG tablet Take 1 tablet by mouth every 4 (four) hours as needed for pain.    [provider]  polyethylene glycol (MIRALAX) packet Take 17 g by mouth daily as needed. 11/08/16   John Giovanni, MD  potassium chloride SA (K-DUR,KLOR-CON) 20 MEQ tablet Take 1 tablet (20 mEq total) by mouth 2 (two) times daily. For 10days then take qd. Patient taking differently: Take 20 mEq by mouth daily as needed (if not feeling good).  12/04/15   Linna Hoff, MD  tetrahydrozoline 0.05 % ophthalmic solution Place 1 drop into both eyes daily as needed (red eyes).    [provider]  vitamin B-12 (CYANOCOBALAMIN) 500 MCG tablet Take 500 mcg by mouth daily.    [provider]  zolpidem (AMBIEN) 10 MG tablet Take 10 mg by mouth at bedtime as needed for sleep.    [provider]    Family History Family History  Problem Relation Age of Onset  . Heart attack Mother 27  . Hypertension Sister   . Diabetes type II Sister   . Tongue cancer Brother   . Heart attack Sister 63  . Lung cancer Sister   . Hypertension Sister     Social History Social History   Tobacco Use  . Smoking status: Current Every Day Smoker    Packs/day: 0.25    Years: 30.00    Pack years: 7.50    Types: Cigarettes  . Smokeless tobacco: Never Used  . Tobacco comment: Smoking since teenage years  Substance  Use Topics  . Alcohol use: No  . Drug use: No     Allergies   Patient has no known allergies.   Review of Systems Review of Systems  Constitutional: Positive for chills, diaphoresis, fatigue and fever.  HENT: Positive for congestion, rhinorrhea and sneezing.   Eyes: Negative for photophobia.  Respiratory: Positive for cough and shortness of breath. Negative for chest tightness, wheezing and stridor.   Cardiovascular: Positive for chest pain.  Gastrointestinal: Positive for nausea and vomiting. Negative for abdominal pain and diarrhea.  Genitourinary: Negative for dysuria, flank pain and frequency.  Musculoskeletal: Negative for back pain, neck pain and neck stiffness.  Skin: Negative for rash and wound.  Neurological:  Negative for syncope, light-headedness and headaches.  Psychiatric/Behavioral: Negative for agitation.  All other systems reviewed and are negative.    Physical Exam Updated Vital Signs BP 118/78   Pulse 85   Temp 99.6 F (37.6 C) (Oral)   Resp 18   Ht 5\' 1"  (1.549 m)   Wt 63.5 kg (140 lb)   SpO2 92%   BMI 26.45 kg/m   Physical Exam  Constitutional: She is oriented to person, place, and time. She appears well-developed and well-nourished. No distress.  HENT:  Head: Normocephalic and atraumatic.  Mouth/Throat: Oropharynx is clear and moist. No oropharyngeal exudate.  Eyes: Conjunctivae and EOM are normal. Pupils are equal, round, and reactive to light.  Neck: Normal range of motion. No JVD present.  Cardiovascular: Normal rate, regular rhythm and intact distal pulses.  No murmur heard. Pulmonary/Chest: Effort normal and breath sounds normal. No respiratory distress. She has no wheezes. She has no rales. She exhibits no tenderness.  Abdominal: Soft. She exhibits no distension. There is no tenderness.  Musculoskeletal: She exhibits no edema or tenderness.  Neurological: She is alert and oriented to person, place, and time. No sensory deficit. She  exhibits normal muscle tone.  Skin: Capillary refill takes less than 2 seconds. She is not diaphoretic. No erythema.  Nursing note and vitals reviewed.    ED Treatments / Results  Labs (all labs ordered are listed, but only abnormal results are displayed) Labs Reviewed  BASIC METABOLIC PANEL - Abnormal; Notable for the following components:      Result Value   Potassium 3.4 (*)    Glucose, Bld 101 (*)    Calcium 8.4 (*)    All other components within normal limits  CBC - Abnormal; Notable for the following components:   Hemoglobin 11.8 (*)    All other components within normal limits  INFLUENZA PANEL BY PCR (TYPE A & B) - Abnormal; Notable for the following components:   Influenza A By PCR POSITIVE (*)    All other components within normal limits  URINALYSIS, ROUTINE W REFLEX MICROSCOPIC - Abnormal; Notable for the following components:   Color, Urine STRAW (*)    All other components within normal limits  URINE CULTURE  I-STAT TROPONIN, ED  I-STAT CG4 LACTIC ACID, ED    EKG  EKG Interpretation  Date/Time:  Tuesday July 09 2017 20:41:55 EST Ventricular Rate:  72 PR Interval:    QRS Duration: 86 QT Interval:  387 QTC Calculation: 424 R Axis:   -20 Text Interpretation:  Sinus rhythm Inferior infarct, old Probable anteroseptal infarct, old When compared to prior, no significant changes seen.  No STEMI Confirmed by Theda Belfastegeler, Chris ([id number]) on 07/09/2017 8:49:20 PM       Radiology Dg Chest 2 View  Result Date: 07/09/2017 CLINICAL DATA:  Chest pain EXAM: CHEST  2 VIEW COMPARISON:  Chest radiograph 07/09/2017 FINDINGS: Improved aeration in the left lower lobe. No focal airspace consolidation or pulmonary edema. No pleural effusion or pneumothorax. IMPRESSION: Improved aeration of the left lower lobe without focal airspace disease. Electronically Signed   By: Deatra RobinsonKevin  Herman M.D.   On: 07/09/2017 21:35   Dg Chest 2 View  Result Date: 07/09/2017 CLINICAL DATA:  Cough, fever,  weakness, with nausea and vomiting for the past 3 weeks. Symptoms had initially improved but have now worsened. History of asthma, long-term smoker. EXAM: CHEST  2 VIEW COMPARISON:  Chest x-ray of November 07, 2016 FINDINGS: The lungs are mildly hyperinflated with hemidiaphragm flattening.  There is subtle increased density that projects in the left lower lobe posterior medially. The heart and pulmonary vascularity are normal. The mediastinum is normal in width. There is mild multilevel degenerative disc disease of the thoracic spine. IMPRESSION: Chronic bronchitic changes. Superimposed subsegmental atelectasis or pneumonia in the left lower lobe. Followup PA and lateral chest X-ray is recommended in 3-4 weeks following trial of antibiotic therapy to ensure resolution and exclude underlying malignancy. Electronically Signed   By: David  [place] M.D.   On: 07/09/2017 10:13    Procedures Procedures (including critical care time)  Medications Ordered in ED Medications  sodium chloride 0.9 % bolus 1,000 mL (0 mLs Intravenous Stopped 07/10/17 0105)  ondansetron (ZOFRAN) injection 4 mg (4 mg Intravenous Given 07/09/17 2159)  ipratropium-albuterol (DUONEB) 0.5-2.5 (3) MG/3ML nebulizer solution 3 mL (3 mLs Nebulization Given 07/09/17 2159)  methylPREDNISolone sodium succinate (SOLU-MEDROL) 125 mg/2 mL injection 125 mg (125 mg Intravenous Given 07/09/17 2159)     Initial Impression / Assessment and Plan / ED Course  I have reviewed the triage vital signs and the nursing notes.  Pertinent labs & imaging results that were available during my care of the patient were reviewed by me and considered in my medical decision making (see chart for details).     [name] is a 61 y.o. female with a past medical history significant for hypertension, COPD presented as of pneumonia this morning discharged on doxycycline presents with nausea, vomiting, intolerance of p.o., chest pain, shortness of breath, fevers, and  malaise.  Patient reports that she was seen earlier and had x-rays showing patient was sent home with doxycycline but after going home, had more nausea, vomiting, and URI symptoms to have some chest discomfort with her coughing fits.  She reports that she vomited her doxycycline and cannot keep fluids down.  She reports feeling worse all over and is concerned about her ability to take care of herself.  On exam, patient has clear lungs.  Chest was slightly tender to palpation but abdomen nontender.  Patient had no significant edema and had pulses in all extremities.  No focal neurologic deficits.  Patient had workup revealing influenza.  Chest x-ray shows no pneumonia.  I suspect patient's viral influenza infection is causing her symptoms rather than a bacterial infection.  Patient had reassuring laboratory testing that was trended from earlier today.  Lactic acid was normal and no leukocytosis present.   Patient's oxygen saturation was in the low 90s was offered admission for borderline hypoxia in the setting of influenza however patient was feeling better after some nausea medicine and fluids.  Patient requested discharge home with outpatient management instructions.  Given the resolution of pneumonia, patient will be discharged without antibiotics and will be given Tamiflu and nausea medicine.  Patient instructed to stay hydrated as well as follow-up with PCP.  Patient voiced understanding of the plan of care as well as return precautions.  Patient had no depressions or concerns and was discharged in good condition.     Final Clinical Impressions(s) / ED Diagnoses   Final diagnoses:  Influenza  Cough  Viral illness  Malaise  Nausea and vomiting, intractability of vomiting not specified, unspecified vomiting type    ED Discharge Orders        Ordered    oseltamivir (TAMIFLU) 75 MG capsule  Every 12 hours     07/10/17 0057    ondansetron (ZOFRAN) 4 MG tablet  Every 8 hours PRN  07/10/17 0057     Clinical Impression: 1. Influenza   2. Cough   3. Viral illness   4. Malaise   5. Nausea and vomiting, intractability of vomiting not specified, unspecified vomiting type     Disposition: Discharge  Condition: Good  I have discussed the results, Dx and Tx plan with the pt(& family if present). He/she/they expressed understanding and agree(s) with the plan. Discharge instructions discussed at great length. Strict return precautions discussed and pt &/or family have verbalized understanding of the instructions. No further questions at time of discharge.    Discharge Medication List as of 07/10/2017 12:58 AM    START taking these medications   Details  ondansetron (ZOFRAN) 4 MG tablet Take 1 tablet (4 mg total) by mouth every 8 (eight) hours as needed., Starting Wed 07/10/2017, Print    oseltamivir (TAMIFLU) 75 MG capsule Take 1 capsule (75 mg total) by mouth every 12 (twelve) hours., Starting Wed 07/10/2017, Print        Follow Up: [facility] [address] [place] [address] [phone] Schedule an appointment as soon as possible for a visit    [facility] EMERGENCY DEPARTMENT [address] [id number] mc [place] [address] [phone]       Quint Chestnut, Canary Brim, MD 07/10/17 [phone]

## 2017-07-11 LAB — URINE CULTURE: Culture: <10000 — AB

## 2017-07-23 ENCOUNTER — Ambulatory Visit: Payer: Managed Care, Other (non HMO)

## 2017-07-24 ENCOUNTER — Other Ambulatory Visit: Payer: Self-pay

## 2017-07-24 ENCOUNTER — Ambulatory Visit (INDEPENDENT_AMBULATORY_CARE_PROVIDER_SITE_OTHER): Payer: Managed Care, Other (non HMO) | Admitting: Internal Medicine

## 2017-07-24 ENCOUNTER — Encounter: Payer: Self-pay | Admitting: Internal Medicine

## 2017-07-24 DIAGNOSIS — I10 Essential (primary) hypertension: Secondary | ICD-10-CM

## 2017-07-24 DIAGNOSIS — Z720 Tobacco use: Secondary | ICD-10-CM | POA: Diagnosis not present

## 2017-07-24 DIAGNOSIS — K219 Gastro-esophageal reflux disease without esophagitis: Secondary | ICD-10-CM

## 2017-07-24 DIAGNOSIS — J449 Chronic obstructive pulmonary disease, unspecified: Secondary | ICD-10-CM | POA: Diagnosis not present

## 2017-07-24 DIAGNOSIS — E02 Subclinical iodine-deficiency hypothyroidism: Secondary | ICD-10-CM | POA: Diagnosis not present

## 2017-07-24 DIAGNOSIS — Z79899 Other long term (current) drug therapy: Secondary | ICD-10-CM | POA: Diagnosis not present

## 2017-07-24 DIAGNOSIS — Z862 Personal history of diseases of the blood and blood-forming organs and certain disorders involving the immune mechanism: Secondary | ICD-10-CM | POA: Diagnosis not present

## 2017-07-24 DIAGNOSIS — K5900 Constipation, unspecified: Secondary | ICD-10-CM | POA: Diagnosis not present

## 2017-07-24 DIAGNOSIS — J439 Emphysema, unspecified: Secondary | ICD-10-CM

## 2017-07-24 MED ORDER — ENALAPRIL MALEATE 10 MG PO TABS: 15.0000 mg | ORAL_TABLET | Freq: Every day | ORAL | 0 refills | Status: DC

## 2017-07-24 MED ORDER — IPRATROPIUM-ALBUTEROL 0.5-2.5 (3) MG/3ML IN SOLN: RESPIRATORY_TRACT | 11 refills | Status: DC

## 2017-07-24 NOTE — Progress Notes (Signed)
   CC: follow up of hypertension and COPD   HPI:  [name] is a 62 y.o. with PMH subclinical hypothyroid, GERD, constipation and tobacco use who presents for follow up of hypertension and COPD. Please see the assessment and plans for the status of the patient chronic medical problems.    Past Medical History:  Diagnosis Date  . Arthritis   . Asthma   . Chest pain 2006  . Chest pain 10/2016  . Chronic back pain   . Hypertension   . Sciatica   . Shortness of breath    due to  smoking    Review of Systems:  Refer to history of present illness and assessment and plans for pertinent review of systems, all others reviewed and negative  Physical Exam:  Vitals:   07/24/17 0840  BP: 119/65  Pulse: (!) 58  Temp: 98.4 F (36.9 C)  SpO2: 99%  Weight: 133 lb 6.4 oz (60.5 kg)  Height: 5\' 1"  (1.549 m)   General; well appearing  Cardiac: regular rate and rhythm, no murmur  Pulm: no respiratory distress, lungs clear to auscultation, no wheezing or rhonchi   Assessment & Plan:   COPD  Last PFTs 10/2016  Moderate Obstructive Airways Disease -Emphysematous Type, Reversible Component Significant response to bronchodilator Over-inflation/ Airtrappiing Diffusion severely reduced.  Uses duoneb about once monthly, has never needed to use the albuterol rescue inhaler but she always keeps it with her for safety. She has wheezing at night but it does not affect her to the where she feels she needs to use the inhaler.  At this point her symptoms seem well controlled but if her symptoms become uncontrolled or she has more frequent exacerbations of the COPD I will need to start maintenance LABA therapy.  - refill duoneb   Hypertension  Blood pressure well controlled today. Shes had persistent hypokalemia in the past which favors stopping the HCTZ. Renal function was within the normal range on last BMP earlier this month.  - stop enalapril - HCTZ 10-25 mg daily  - start enalapril 15 mg  daily  - return in 1 month for blood pressure recheck and follow up BMP   Preventative health  - HIV and Hep C screening when she is having a BMP drawn next month   See Encounters Tab for problem based charting.  Patient discussed with Dr. Heide SparkNarendra

## 2017-07-24 NOTE — Patient Instructions (Addendum)
Thank you for coming to the clinic today. It was a pleasure to see you.   I have refilled your duo nebs prescription  For your hypertension please stop taking the combination pill enalapril-HCTZ and start taking enalapril 15 mg daily  FOLLOW-UP INSTRUCTIONS When: 1 month in the acute care clinic For: A blood pressure check What to bring: all of your medication bottles   Please call our clinic if you have any questions or concerns, we may be able to help and keep you from a long and expensive emergency room wait. Our clinic and after hours phone number is [phone], there is always someone available.

## 2017-07-26 ENCOUNTER — Encounter: Payer: Self-pay | Admitting: Internal Medicine

## 2017-07-26 NOTE — Assessment & Plan Note (Signed)
Blood pressure well controlled today. Shes had persistent hypokalemia in the past which favors stopping the HCTZ. Renal function was within the normal range on last BMP earlier this month.  - stop enalapril - HCTZ 10-25 mg daily  - start enalapril 15 mg daily  - return in 1 month for blood pressure recheck and follow up BMP

## 2017-07-26 NOTE — Assessment & Plan Note (Signed)
Last PFTs 10/2016  Moderate Obstructive Airways Disease -Emphysematous Type, Reversible Component Significant response to bronchodilator Over-inflation/ Airtrappiing Diffusion severely reduced.  Uses duoneb about once monthly, has never needed to use the albuterol rescue inhaler but she always keeps it with her for safety. She has wheezing at night but it does not affect her to the where she feels she needs to use the inhaler.  At this point her symptoms seem well controlled but if her symptoms become uncontrolled or she has more frequent exacerbations of the COPD I will need to start maintenance LABA therapy.  - refill duoneb

## 2017-07-29 NOTE — Progress Notes (Signed)
Internal Medicine Clinic Attending  Case discussed with Dr. Obie DredgeBlum at the time of the visit.  We reviewed the resident's history and exam and pertinent patient test results.  I agree with the assessment, diagnosis, and plan of care documented in the resident's note.  If patient's COPD symptoms worsen would consider long term LAMA therapy initially not LABA

## 2017-08-21 ENCOUNTER — Other Ambulatory Visit: Payer: Self-pay | Admitting: Internal Medicine

## 2017-08-21 ENCOUNTER — Ambulatory Visit: Payer: Managed Care, Other (non HMO)

## 2018-03-18 ENCOUNTER — Encounter: Payer: Managed Care, Other (non HMO) | Admitting: Internal Medicine

## 2018-06-10 ENCOUNTER — Encounter: Payer: Self-pay | Admitting: Internal Medicine

## 2018-07-06 ENCOUNTER — Ambulatory Visit (HOSPITAL_COMMUNITY)
Admission: EM | Admit: 2018-07-06 | Discharge: 2018-07-06 | Disposition: A | Discharge status: Home / Self Care | Payer: 59 | Attending: Emergency Medicine | Admitting: Emergency Medicine

## 2018-07-06 ENCOUNTER — Encounter (HOSPITAL_COMMUNITY): Payer: Self-pay | Admitting: Emergency Medicine

## 2018-07-06 ENCOUNTER — Telehealth (HOSPITAL_COMMUNITY): Payer: Self-pay | Admitting: Emergency Medicine

## 2018-07-06 DIAGNOSIS — W57XXXA Bitten or stung by nonvenomous insect and other nonvenomous arthropods, initial encounter: Secondary | ICD-10-CM | POA: Diagnosis not present

## 2018-07-06 DIAGNOSIS — S80262A Insect bite (nonvenomous), left knee, initial encounter: Secondary | ICD-10-CM | POA: Diagnosis not present

## 2018-07-06 MED ORDER — DOXYCYCLINE HYCLATE 100 MG PO TABS
100.0000 mg | ORAL_TABLET | Freq: Once | ORAL | Status: AC
  Administered 2018-07-06: 100 mg via ORAL

## 2018-07-06 MED ORDER — TRIAMCINOLONE ACETONIDE 0.1 % EX CREA: 1.0000 "application " | TOPICAL_CREAM | Freq: Two times a day (BID) | CUTANEOUS | 0 refills | Status: DC

## 2018-07-06 MED ORDER — DOXYCYCLINE HYCLATE 100 MG PO CAPS: 100.0000 mg | ORAL_CAPSULE | Freq: Two times a day (BID) | ORAL | 0 refills | Status: DC

## 2018-07-06 MED ORDER — DOXYCYCLINE HYCLATE 100 MG PO CAPS: 100.0000 mg | ORAL_CAPSULE | Freq: Two times a day (BID) | ORAL | 0 refills | Status: AC

## 2018-07-06 MED ORDER — DOXYCYCLINE HYCLATE 100 MG PO TABS
ORAL_TABLET | ORAL | Status: AC
  Filled 2018-07-06: qty 1

## 2018-07-06 NOTE — Telephone Encounter (Signed)
The patient requested that her medications be resent to the [facility] on [address]

## 2018-07-06 NOTE — ED Triage Notes (Signed)
Pt here for insect bite to left leg with pain and swelling

## 2018-07-06 NOTE — Discharge Instructions (Signed)
Begin doxycycline twice daily for 10 days Triamcinolone twice daily  Please followup if streaking continuing to spread, developing increased pain, redness, difficulty moving leg, lightheadedness, dizziness or fevers

## 2018-07-07 ENCOUNTER — Emergency Department (HOSPITAL_COMMUNITY): Payer: 59

## 2018-07-07 ENCOUNTER — Emergency Department (HOSPITAL_COMMUNITY)
Admission: EM | Admit: 2018-07-07 | Discharge: 2018-07-07 | Discharge status: Against Medical Advice | Payer: 59 | Attending: Emergency Medicine | Admitting: Emergency Medicine

## 2018-07-07 ENCOUNTER — Other Ambulatory Visit: Payer: Self-pay

## 2018-07-07 ENCOUNTER — Encounter (HOSPITAL_COMMUNITY): Payer: Self-pay | Admitting: Emergency Medicine

## 2018-07-07 DIAGNOSIS — R079 Chest pain, unspecified: Secondary | ICD-10-CM | POA: Diagnosis not present

## 2018-07-07 LAB — BASIC METABOLIC PANEL
ANION GAP: 8 (ref 5–15)
BUN: 17 mg/dL (ref 8–23)
CALCIUM: 9.1 mg/dL (ref 8.9–10.3)
CHLORIDE: 103 mmol/L (ref 98–111)
CO2: 28 mmol/L (ref 22–32)
Creatinine, Ser: 0.84 mg/dL (ref 0.44–1.00)
GFR calc Af Amer: >60 mL/min (ref >60)
GFR calc non Af Amer: >60 mL/min (ref >60)
GLUCOSE: 97 mg/dL (ref 70–99)
Potassium: 3.3 mmol/L — ABNORMAL LOW (ref 3.5–5.1)
Sodium: 139 mmol/L (ref 135–145)

## 2018-07-07 LAB — CBC
HEMATOCRIT: 40.8 % (ref 36.0–46.0)
Hemoglobin: 12.3 g/dL (ref 12.0–15.0)
MCH: 28.3 pg (ref 26.0–34.0)
MCHC: 30.1 g/dL (ref 30.0–36.0)
MCV: 94 fL (ref 80.0–100.0)
NRBC: 0 % (ref 0.0–0.2)
PLATELETS: 279 10*3/uL (ref 150–400)
RBC: 4.34 MIL/uL (ref 3.87–5.11)
RDW: 14.6 % (ref 11.5–15.5)
WBC: 5.8 10*3/uL (ref 4.0–10.5)

## 2018-07-07 LAB — I-STAT TROPONIN, ED: Troponin i, poc: 0 ng/mL (ref 0.00–0.08)

## 2018-07-07 MED ORDER — SODIUM CHLORIDE 0.9% FLUSH: 3.0000 mL | Freq: Once | INTRAVENOUS | Status: DC

## 2018-07-07 NOTE — Discharge Instructions (Signed)
1. Medications: usual home medications 2. Treatment: rest, drink plenty of fluids,  3. Follow Up: Please followup with your primary doctor in 1 days for discussion of your diagnoses and further evaluation after today's visit; if you do not have a primary care doctor use the resource guide provided to find one; Please return to the ER for new or worsening symptoms

## 2018-07-07 NOTE — ED Triage Notes (Signed)
Pt to triage via [facility].  C/o sharp substernal chest pain that radiates to L arm with SOB, nausea, and generalized weakness x 2 hours.  EMS administered ASA 324mg  PTA.

## 2018-07-07 NOTE — ED Provider Notes (Signed)
[facility]    CSN: [id number] Arrival date & time: 07/06/18  1552     History   Chief Complaint Chief Complaint  Patient presents with  . Insect Bite    HPI [name] is a 63 y.o. female history of COPD, hypertension, GERD, presenting today for evaluation of possible spider bite.  Patient states that 2 days ago she rode in a taxicab and when she went to exit the She felt as if something bit her.  Since in this area behind her left knee she has noticed increased change in color, discoloration and pain.  She has had some mild itching, but pain is more prominent.  She tried putting peroxide on this area.  She denies anything in this area of her skin previously.  Denies fevers, but has felt slightly off.  HPI  Past Medical History:  Diagnosis Date  . Arthritis   . Asthma   . Chest pain 2006  . Chest pain 10/2016  . Chronic back pain   . Hypertension   . Sciatica   . Shortness of breath    due to  smoking     Patient Active Problem List   Diagnosis Date Noted  . Pain of left upper extremity 12/28/2016  . Elevated TSH 11/08/2016  . Gastroesophageal reflux disease 11/08/2016  . Constipation 11/08/2016  . COPD (chronic obstructive pulmonary disease) (HCC) 11/08/2016  . Tobacco use 11/08/2016  . Atypical chest pain 11/07/2016  . Unstable angina (HCC) 01/07/2016  . HTN (hypertension) 01/07/2016  . Hypokalemia 11/06/2013  . Acute blood loss anemia 11/06/2013  . Osteoarthritis of left hip 11/05/2013  . 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 ABDOMINAL HYSTERECTOMY  1997  . CARDIAC CATHETERIZATION  2006  . HEMI-MICRODISCECTOMY LUMBAR LAMINECTOMY LEVEL 1  05/14/2012   Procedure: HEMI-MICRODISCECTOMY LUMBAR LAMINECTOMY LEVEL 1;  Surgeon: Jacki Cones, MD;  Location: [facility];  Service: Orthopedics;  Laterality:  N/A;  Hemi Laminectomy Microdiscectomy L4 - L5 Central (X-Ray)  . ingrown toenail removal    . TONSILLECTOMY    . TOTAL HIP ARTHROPLASTY Left 11/05/2013   Procedure: LEFT TOTAL HIP ARTHROPLASTY;  Surgeon: Jacki Cones, MD;  Location: [facility];  Service: Orthopedics;  Laterality: Left;    OB History    Gravida  3   Para  3   Term      Preterm      AB      Living        SAB      TAB      Ectopic      Multiple      Live Births               Home Medications    Prior to Admission medications   Medication Sig Start Date End Date Taking? Authorizing Provider  albuterol (PROVENTIL HFA;VENTOLIN HFA) 108 (90 BASE) MCG/ACT inhaler Inhale 2 puffs into the lungs every 4 (four) hours as needed for wheezing or shortness of breath. 09/25/14   Eber Hong, MD  doxycycline (VIBRAMYCIN) 100 MG capsule Take 1 capsule (100 mg total) by mouth 2 (two) times daily for 10 days. 07/06/18 07/16/18  Tyshia Fenter C, PA-C  enalapril (VASOTEC) 10 MG tablet TAKE 1 AND 1/2 TABLETS DAILY BY MOUTH 08/22/17   Eulah Pont, MD  ipratropium-albuterol (DUONEB) 0.5-2.5 (3) MG/3ML  SOLN Nebulize 1 vial for inhalation twice daily 07/24/17   Eulah PontBlum, Nina, MD  Multiple Vitamin (MULTIVITAMIN WITH MINERALS) TABS tablet Take 1 tablet by mouth daily.    [provider]  triamcinolone cream (KENALOG) 0.1 % Apply 1 application topically 2 (two) times daily. 07/06/18   Powell Halbert, Junius CreamerHallie C, PA-C    Family History Family History  Problem Relation Age of Onset  . Heart attack Mother 6257  . Hypertension Sister   . Diabetes type II Sister   . Tongue cancer Brother   . Heart attack Sister 3165  . Lung cancer Sister   . Hypertension Sister     Social History Social History   Tobacco Use  . Smoking status: Current Every Day Smoker    Packs/day: 0.25    Years: 30.00    Pack years: 7.50    Types: Cigarettes  . Smokeless tobacco: Never Used  . Tobacco comment: Smoking since teenage years  Substance Use  Topics  . Alcohol use: No  . Drug use: No     Allergies   Patient has no known allergies.   Review of Systems Review of Systems  Constitutional: Negative for fatigue and fever.  Eyes: Negative for visual disturbance.  Respiratory: Negative for shortness of breath.   Cardiovascular: Negative for chest pain.  Gastrointestinal: Negative for abdominal pain, nausea and vomiting.  Musculoskeletal: Negative for arthralgias and joint swelling.  Skin: Positive for color change and rash. Negative for wound.  Neurological: Negative for dizziness, weakness, light-headedness and headaches.     Physical Exam Triage Vital Signs ED Triage Vitals  Enc Vitals Group     BP 07/06/18 1627 (!) 106/56     Pulse Rate 07/06/18 1627 61     Resp 07/06/18 1627 16     Temp 07/06/18 1627 98.3 F (36.8 C)     Temp Source 07/06/18 1627 Oral     SpO2 07/06/18 1627 100 %     Weight --      Height --      Head Circumference --      Peak Flow --      Pain Score 07/06/18 1632 7     Pain Loc --      Pain Edu? --      Excl. in GC? --    No data found.  Updated Vital Signs BP (!) 106/56 (BP Location: Right Arm)   Pulse 61   Temp 98.3 F (36.8 C) (Oral)   Resp 16   SpO2 100%   Visual Acuity Right Eye Distance:   Left Eye Distance:   Bilateral Distance:    Right Eye Near:   Left Eye Near:    Bilateral Near:     Physical Exam Vitals signs and nursing note reviewed.  Constitutional:      Appearance: She is well-developed.     Comments: No acute distress  HENT:     Head: Normocephalic and atraumatic.     Nose: Nose normal.  Eyes:     Conjunctiva/sclera: Conjunctivae normal.  Neck:     Musculoskeletal: Neck supple.  Cardiovascular:     Rate and Rhythm: Normal rate.  Pulmonary:     Effort: Pulmonary effort is normal. No respiratory distress.  Abdominal:     General: There is no distension.  Musculoskeletal: Normal range of motion.  Skin:    General: Skin is warm and dry.      Comments: Left popliteal area with hyperpigmented discoloration, slight increase in warmth,  various areas of slightly raised bumps that appear similar to varicosities, slight streaking of this discoloration more distally, see picture below  Neurological:     Mental Status: She is alert and oriented to person, place, and time.        UC Treatments / Results  Labs (all labs ordered are listed, but only abnormal results are displayed) Labs Reviewed - No data to display  EKG None  Radiology Dg Chest 2 View  Result Date: 07/07/2018 CLINICAL DATA:  63 y/o  F; mid chest pain. History of COPD. EXAM: CHEST - 2 VIEW COMPARISON:  07/09/2017 chest radiograph FINDINGS: Stable heart size and mediastinal contours are within normal limits. Stable hyperinflation of the lungs compatible with COPD. Linear opacity left lung base compatible with atelectasis or scarring. No consolidation, effusion, or pneumothorax. Mild multilevel degenerative changes of the spine. IMPRESSION: Linear opacity left lung base compatible with atelectasis or scarring. No consolidation. COPD. Electronically Signed   By: Mitzi HansenLance  Furusawa-Stratton M.D.   On: 07/07/2018 05:19    Procedures Procedures (including critical care time)  Medications Ordered in UC Medications  doxycycline (VIBRA-TABS) tablet 100 mg (100 mg Oral Given 07/06/18 1710)    Initial Impression / Assessment and Plan / UC Course  I have reviewed the triage vital signs and the nursing notes.  Pertinent labs & imaging results that were available during my care of the patient were reviewed by me and considered in my medical decision making (see chart for details).     Patient with pain and discoloration in the area secondary to possible bite of insect versus spider.  Patient denied previous discoloration or veins in this area.  Will cover for infection with doxycycline, will provide triamcinolone to use twice daily to help with any inflammatory response related to  a bite.  We will continue to monitor the streaking and for more systemic symptoms.Discussed strict return precautions. Patient verbalized understanding and is agreeable with plan.  Final Clinical Impressions(s) / UC Diagnoses   Final diagnoses:  Insect bite of left knee, initial encounter     Discharge Instructions     Begin doxycycline twice daily for 10 days Triamcinolone twice daily  Please followup if streaking continuing to spread, developing increased pain, redness, difficulty moving leg, lightheadedness, dizziness or fevers   ED Prescriptions    Medication Sig Dispense Auth. Provider   doxycycline (VIBRAMYCIN) 100 MG capsule Take 1 capsule (100 mg total) by mouth 2 (two) times daily for 10 days. 20 capsule Leonardo Makris C, PA-C   triamcinolone cream (KENALOG) 0.1 % Apply 1 application topically 2 (two) times daily. 30 g Mavin Dyke, CrawfordHallie C, PA-C     Controlled Substance Prescriptions [place] Controlled Substance Registry consulted? Not Applicable   Lew DawesWieters, [employer] C, [place] JerseyPA-C 07/07/18 [phone]

## 2018-07-07 NOTE — ED Provider Notes (Signed)
[name] is a 63 year old female who presents emergency department with chest pain for several hours.  I was called to triage to evaluate her as she is requesting to leave.  Patient reports she no longer wishes to be here in the emergency department and she does not wish to have her chest pain evaluated.  She does not want any physical exam.  Triage note states that patient presents via EMS with sharp substernal chest pain with radiation to the left arm and associated shortness of breath, nausea and generalized weakness for 2 hours prior to arrival.  EMS did give 324 mg of aspirin prior to arrival.  Physical Exam  BP 106/72 (BP Location: Right Arm)   Pulse (!) 54   Temp 98.3 F (36.8 C) (Oral)   Resp 18   Ht 5\' 1"  (1.549 m)   Wt 61.2 kg   SpO2 97%   BMI 25.51 kg/m   Physical Exam Constitutional:      Appearance: She is well-developed.  HENT:     Head: Normocephalic.  Pulmonary:     Effort: Pulmonary effort is normal.  Musculoskeletal: Normal range of motion.  Neurological:     Mental Status: She is alert.     Comments: Normal gait     ED Course/Procedures     Procedures  MDM  Pt presents with chest pain.  Labs and x-ray have not yet resulted.  EGG with bradycardia.  Patient wishes to leave.  Her work-up is not complete and she has not had a physical evaluation.  She does not wish to provide additional history, have a physical exam or await lab work.  We discussed the nature and purpose, risks and benefits, as well as, the alternatives of treatment.  I have informed patient that her weight will not be long.  Time was given to allow the opportunity to ask questions and consider their options, and after the discussion, the patient decided to refuse the offerred treatment including history, physical exam and continued lab work. The patient was informed that refusal could lead to, but was not limited to, death, permanent disability, or severe pain.  Did not have any friends or  family with her to help encourage her to stay.  Patient appears to have the capacity to make their decision and understood the consequences of that decision.  Patient was given referral to cardiology for further evaluation.  The patient was notified that they may return to the emergency department at any time for further treatment.     Chest pain, unspecified type      Milta DeitersMuthersbaugh, Avagail Whittlesey, PA-C 07/07/18 [phone]    Nira Connardama, Pedro Eduardo, MD 07/08/18 [phone]

## 2018-07-07 NOTE — ED Notes (Signed)
Risk of leaving explained to pt by PA.  Pt encouraged multiple times to stay and she refuses.

## 2018-07-07 NOTE — ED Notes (Signed)
Pt states she is leaving to go home and take gas medicine.  Informed her there is 1 person in front of her and she will be going to a room soon.  Pt states to take her IV out because she is leaving.  Dahlia Client, PA at triage to speak with pt that continues to refuse to stay.

## 2018-11-19 ENCOUNTER — Encounter: Payer: Self-pay | Admitting: *Deleted

## 2018-12-01 ENCOUNTER — Emergency Department (HOSPITAL_COMMUNITY)
Admission: EM | Admit: 2018-12-01 | Discharge: 2018-12-01 | Disposition: A | Discharge status: Home / Self Care | Payer: Medicare HMO | Attending: Emergency Medicine | Admitting: Emergency Medicine

## 2018-12-01 ENCOUNTER — Emergency Department (HOSPITAL_COMMUNITY): Payer: Medicare HMO

## 2018-12-01 ENCOUNTER — Other Ambulatory Visit: Payer: Self-pay

## 2018-12-01 ENCOUNTER — Encounter (HOSPITAL_COMMUNITY): Payer: Self-pay | Admitting: Emergency Medicine

## 2018-12-01 DIAGNOSIS — F17210 Nicotine dependence, cigarettes, uncomplicated: Secondary | ICD-10-CM | POA: Diagnosis not present

## 2018-12-01 DIAGNOSIS — W19XXXA Unspecified fall, initial encounter: Secondary | ICD-10-CM

## 2018-12-01 DIAGNOSIS — Y9301 Activity, walking, marching and hiking: Secondary | ICD-10-CM | POA: Insufficient documentation

## 2018-12-01 DIAGNOSIS — J449 Chronic obstructive pulmonary disease, unspecified: Secondary | ICD-10-CM | POA: Diagnosis not present

## 2018-12-01 DIAGNOSIS — Z79899 Other long term (current) drug therapy: Secondary | ICD-10-CM | POA: Diagnosis not present

## 2018-12-01 DIAGNOSIS — W109XXA Fall (on) (from) unspecified stairs and steps, initial encounter: Secondary | ICD-10-CM | POA: Insufficient documentation

## 2018-12-01 DIAGNOSIS — S99911A Unspecified injury of right ankle, initial encounter: Secondary | ICD-10-CM | POA: Diagnosis present

## 2018-12-01 DIAGNOSIS — Y929 Unspecified place or not applicable: Secondary | ICD-10-CM | POA: Insufficient documentation

## 2018-12-01 DIAGNOSIS — Z96642 Presence of left artificial hip joint: Secondary | ICD-10-CM | POA: Insufficient documentation

## 2018-12-01 DIAGNOSIS — I10 Essential (primary) hypertension: Secondary | ICD-10-CM | POA: Diagnosis not present

## 2018-12-01 DIAGNOSIS — Y999 Unspecified external cause status: Secondary | ICD-10-CM | POA: Diagnosis not present

## 2018-12-01 MED ORDER — OXYCODONE-ACETAMINOPHEN 5-325 MG PO TABS
1.0000 | ORAL_TABLET | Freq: Once | ORAL | Status: AC
  Administered 2018-12-01: 1 via ORAL
  Filled 2018-12-01: qty 1

## 2018-12-01 MED ORDER — DICLOFENAC SODIUM 1 % TD GEL: 4.0000 g | Freq: Four times a day (QID) | TRANSDERMAL | 0 refills | Status: DC | PRN

## 2018-12-01 NOTE — Progress Notes (Signed)
Orthopedic Tech Progress Note Patient Details:  [name] 1955/09/08 [id number]  Ortho Devices Type of Ortho Device: ASO, Crutches Ortho Device/Splint Interventions: Adjustment, Application, Ordered   Post Interventions Patient Tolerated: Well Instructions Provided: Care of device, Adjustment of device, Poper ambulation with device   Melony Overly T 12/01/2018, 2:06 PM

## 2018-12-01 NOTE — ED Triage Notes (Signed)
Pt arrives via EMS with reports of slipping down 2-3 steps. Endorses pain to right ankle. Denies any LOC or hitting head.

## 2018-12-01 NOTE — Discharge Instructions (Addendum)
Please read and follow all provided instructions.  You have been seen today for a fall with a right ankle injury. The xray did not show fracture/dislocation. We have placed you a brace for support. Please wear this for the next 1 week. Follow below instructions. Use crutches as needed & put weight on the right foot as tolerated, you may discontinue crutch use as able.   Tests performed today include: An x-ray of the affected area - does NOT show any broken bones or dislocations.  Vital signs. See below for your results today.   Home care instructions: -- *PRICE in the first 24-48 hours after injury: Protect (with brace, splint, sling), if given by your provider Rest Ice- Do not apply ice pack directly to your skin, place towel or similar between your skin and ice/ice pack. Apply ice for 20 min, then remove for 40 min while awake Compression- Wear brace, elastic bandage, splint as directed by your provider Elevate affected extremity above the level of your heart when not walking around for the first 24-48 hours   Medications:  - Diclofenac gel- please apply up to 4 times per day as needed to the ankle/lower leg/foot for pain/swelling.   You make take Tylenol per over the counter dosing with these medications.   We have prescribed you new medication(s) today. Discuss the medications prescribed today with your pharmacist as they can have adverse effects and interactions with your other medicines including over the counter and prescribed medications. Seek medical evaluation if you start to experience new or abnormal symptoms after taking one of these medicines, seek care immediately if you start to experience difficulty breathing, feeling of your throat closing, facial swelling, or rash as these could be indications of a more serious allergic reaction   Follow-up instructions: Please follow-up with your primary care provider or the provided orthopedic physician (bone specialist) if you continue  to have significant pain in 1 week. In this case you may have a more severe injury that requires further care.   Return instructions:  Please return if your digits or extremity are numb or tingling, appear gray or blue, or you have severe pain (also elevate the extremity and loosen splint or wrap if you were given one) Please return if you have redness or fevers.  Please return to the Emergency Department if you experience worsening symptoms.  Please return if you have any other emergent concerns. Additional Information:  Your vital signs today were: BP 112/76    Pulse 64    Temp 98.8 F (37.1 C) (Oral)    SpO2 97%  If your blood pressure (BP) was elevated above 135/85 this visit, please have this repeated by your doctor within one month. ---------------

## 2018-12-01 NOTE — ED Notes (Signed)
Pt verbalized understanding of discharge paperwork.

## 2018-12-01 NOTE — ED Notes (Signed)
Patient transported to X-ray 

## 2018-12-01 NOTE — ED Provider Notes (Signed)
[facility]   CSN: [id number] Arrival date & time: 12/01/18  1234     History   Chief Complaint Chief Complaint  Patient presents with  . Fall    HPI Fransisca KaufmannRenee E Neidert is a 63 y.o. female with a hx of tobacco abuse, COPD, hypertension, & chronic back pain who presents to the ED via EMS s/p fall just PTA with complaints of R ankle pain. Patient states she was walking [place], went to adjust her mask, & accidentally missed the last 2-3 steps. She twisted the right lower leg/ankle resulting in a fall. Denies head injury or LOC. Reports isolated injury to RLE, pain is constant 9-10/10 in severity, worse with movement, has not attempted to bearweight, no alleviating factors, no intervention en route. Paresthesias to the RLE toes. Denies complete numbness or weakness. Denies headache, neck pain, acute back pain (chronic unchanged is present), chest pain, or abdominal pain. No prodromal sxs prior to fall states she simply missed the steps.     HPI  Past Medical History:  Diagnosis Date  . Arthritis   . Asthma   . Chest pain 2006  . Chest pain 10/2016  . Chronic back pain   . Hypertension   . Sciatica   . Shortness of breath    due to  smoking     Patient Active Problem List   Diagnosis Date Noted  . Pain of left upper extremity 12/28/2016  . Elevated TSH 11/08/2016  . Gastroesophageal reflux disease 11/08/2016  . Constipation 11/08/2016  . COPD (chronic obstructive pulmonary disease) (HCC) 11/08/2016  . Tobacco use 11/08/2016  . Atypical chest pain 11/07/2016  . Unstable angina (HCC) 01/07/2016  . HTN (hypertension) 01/07/2016  . Hypokalemia 11/06/2013  . Acute blood loss anemia 11/06/2013  . Osteoarthritis of left hip 11/05/2013  . 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 ABDOMINAL HYSTERECTOMY  1997  . CARDIAC CATHETERIZATION  2006  . HEMI-MICRODISCECTOMY LUMBAR LAMINECTOMY LEVEL 1  05/14/2012   Procedure: HEMI-MICRODISCECTOMY LUMBAR LAMINECTOMY LEVEL 1;  Surgeon: Jacki Conesonald A Gioffre, MD;  Location: [facility];  Service: Orthopedics;  Laterality: N/A;  Hemi Laminectomy Microdiscectomy L4 - L5 Central (X-Ray)  . ingrown toenail removal    . TONSILLECTOMY    . TOTAL HIP ARTHROPLASTY Left 11/05/2013   Procedure: LEFT TOTAL HIP ARTHROPLASTY;  Surgeon: Jacki Conesonald A Gioffre, MD;  Location: [facility];  Service: Orthopedics;  Laterality: Left;     OB History    Gravida  3   Para  3   Term      Preterm      AB      Living        SAB      TAB      Ectopic      Multiple      Live Births               Home Medications    Prior to Admission medications   Medication Sig Start Date End Date Taking? Authorizing Provider  albuterol (PROVENTIL HFA;VENTOLIN HFA) 108 (90 BASE) MCG/ACT inhaler Inhale 2 puffs into the lungs every 4 (four) hours as needed for wheezing or shortness of breath. 09/25/14   Eber HongMiller, Brian, MD  enalapril (VASOTEC) 10 MG tablet TAKE 1 AND 1/2 TABLETS DAILY  BY MOUTH 08/22/17   Ledell Noss, MD  ipratropium-albuterol (DUONEB) 0.5-2.5 (3) MG/3ML SOLN Nebulize 1 vial for inhalation twice daily 07/24/17   Ledell Noss, MD  Multiple Vitamin (MULTIVITAMIN WITH MINERALS) TABS tablet Take 1 tablet by mouth daily.    [provider]  triamcinolone cream (KENALOG) 0.1 % Apply 1 application topically 2 (two) times daily. 07/06/18   Wieters, Elesa Hacker, PA-C    Family History Family History  Problem Relation Age of Onset  . Heart attack Mother 63  . Hypertension Sister   . Diabetes type II Sister   . Tongue cancer Brother   . Heart attack Sister 53  . Lung cancer Sister   . Hypertension Sister     Social History Social History   Tobacco Use  . Smoking status: Current Every Day Smoker    Packs/day: 0.25     Years: 30.00    Pack years: 7.50    Types: Cigarettes  . Smokeless tobacco: Never Used  . Tobacco comment: Smoking since teenage years  Substance Use Topics  . Alcohol use: No  . Drug use: No     Allergies   Patient has no known allergies.   Review of Systems Review of Systems  Constitutional: Negative for chills, diaphoresis and fever.  Respiratory: Negative for shortness of breath.   Cardiovascular: Negative for chest pain.  Musculoskeletal: Positive for arthralgias and back pain (chronic, no acute change). Negative for neck pain.  Skin: Negative for wound.  Neurological: Negative for dizziness, syncope, weakness, light-headedness, numbness and headaches.       + for R foot paresthesias.   All other systems reviewed and are negative.    Physical Exam Updated Vital Signs BP 112/76   Pulse 64   Temp 98.8 F (37.1 C) (Oral)   SpO2 97%   Physical Exam Vitals signs and nursing note reviewed.  Constitutional:      General: She is not in acute distress.    Appearance: She is well-developed. She is not toxic-appearing.  HENT:     Head: Normocephalic and atraumatic.     Comments: No raccoon eyes or battle sign. Eyes:     General:        Right eye: No discharge.        Left eye: No discharge.     Conjunctiva/sclera: Conjunctivae normal.  Neck:     Musculoskeletal: Normal range of motion and neck supple.     Comments: No midline tenderness or palpable step-off. Cardiovascular:     Rate and Rhythm: Normal rate and regular rhythm.     Comments: 2+ symmetric DP/PT pulses bilaterally. Pulmonary:     Effort: Pulmonary effort is normal. No respiratory distress.     Breath sounds: Normal breath sounds. No wheezing, rhonchi or rales.  Chest:     Chest wall: No tenderness.  Abdominal:     General: There is no distension.     Palpations: Abdomen is soft.     Tenderness: There is no abdominal tenderness. There is no guarding or rebound.  Musculoskeletal:     Comments: No  obvious deformity, ecchymosis, or significant open wounds Upper extremities: Normal active range of motion throughout without point/focal bony tenderness Back: No point/focal midline tenderness to palpation Lower extremities: Patient has intact active range of motion throughout all joints with the exception of limitation at the right ankle- able to minimally plantar/dorsiflex. Able to move all digits. She is tender to palpation to the RLE over the base of  the 5th, lateral malleolus, lateral ankle ligaments & distal 1/3rd of the fibular. Otherwise nontender LEs. No hip tenderness or palpable pelvic instability.   Skin:    General: Skin is warm and dry.     Findings: No rash.  Neurological:     Mental Status: She is alert.     Comments: Clear speech.  Sensation grossly intact bilateral lower extremities.  Good strength plantar dorsiflexion bilaterally.  Psychiatric:        Behavior: Behavior normal.    ED Treatments / Results  Labs (all labs ordered are listed, but only abnormal results are displayed) Labs Reviewed - No data to display  EKG None  Radiology Dg Tibia/fibula Right  Result Date: 12/01/2018 CLINICAL DATA:  63 year old female with a history of fall down steps EXAM: RIGHT TIBIA AND FIBULA - 2 VIEW COMPARISON:  None. FINDINGS: There is no evidence of fracture or other focal bone lesions. Soft tissues are unremarkable. IMPRESSION: Negative plain film of the lower leg Electronically Signed   By: Gilmer MorJaime  Wagner D.O.   On: 12/01/2018 13:21   Dg Ankle Complete Right  Result Date: 12/01/2018 CLINICAL DATA:  63 year old female with fall and right foot/ankle pain. EXAM: RIGHT FOOT COMPLETE - 3+ VIEW; RIGHT ANKLE - COMPLETE 3+ VIEW COMPARISON:  Right foot radiograph dated 10/20/2014 FINDINGS: There is no acute fracture or dislocation. No significant arthritic changes. The ankle mortise is intact. Mild skin thickening and subcutaneous edema of the foot. IMPRESSION: No acute fracture or  dislocation. Electronically Signed   By: Elgie CollardArash  Radparvar M.D.   On: 12/01/2018 13:32   Dg Foot Complete Right  Result Date: 12/01/2018 CLINICAL DATA:  63 year old female with fall and right foot/ankle pain. EXAM: RIGHT FOOT COMPLETE - 3+ VIEW; RIGHT ANKLE - COMPLETE 3+ VIEW COMPARISON:  Right foot radiograph dated 10/20/2014 FINDINGS: There is no acute fracture or dislocation. No significant arthritic changes. The ankle mortise is intact. Mild skin thickening and subcutaneous edema of the foot. IMPRESSION: No acute fracture or dislocation. Electronically Signed   By: Elgie CollardArash  Radparvar M.D.   On: 12/01/2018 13:32    Procedures Procedures (including critical care time)  SPLINT APPLICATION Date/Time: 2:15 PM Authorized by: Harvie HeckSamantha Jabree Rebert Consent: Verbal consent obtained. Risks and benefits: risks, benefits and alternatives were discussed Consent given by: patient Splint applied by: orthopedic technician Location details: RLE Splint type: ASO Supplies used: ASO Post-procedure: The splinted body part was neurovascularly unchanged following the procedure. Patient tolerance: Patient tolerated the procedure well with no immediate complications.  Medications Ordered in ED Medications  oxyCODONE-acetaminophen (PERCOCET/ROXICET) 5-325 MG per tablet 1 tablet (has no administration in time range)    Initial Impression / Assessment and Plan / ED Course  I have reviewed the triage vital signs and the nursing notes.  Pertinent labs & imaging results that were available during my care of the patient were reviewed by me and considered in my medical decision making (see chart for details).   Patient presents to the emergency department via EMS status post mechanical fall with complaints of right ankle pain.  No signs of serious head, neck, back, or intra thoracic/abdominal injury. No focal neuro deficits. No point/focal spinal tenderness or chest/abdominal tenderness. X-rays of RLE ordered in  concordance of areas of tenderness.  Grossly neurovascularly intact distally  X-rays the right lower extremity are negative for fracture or dislocation. Placed patient in right ankle ASO and provide crutches to use as needed, plan for voltaren gel, weight-bear as tolerated with orthopedics  follow-up.   I discussed results, treatment plan, need for follow-up, and return precautions with the patient. Provided opportunity for questions, patient confirmed understanding and is in agreement with plan.   Findings and plan of care discussed with supervising physician Dr. Jeraldine LootsLockwood who has evaluated patient & is in agreement.   Final Clinical Impressions(s) / ED Diagnoses   Final diagnoses:  Fall, initial encounter  Injury of right ankle, initial encounter    ED Discharge Orders         Ordered    diclofenac sodium (VOLTAREN) 1 % GEL  4 times daily PRN     12/01/18 [id number]           Juelz Claar, PennsburgSamantha R, PA-C 12/01/18 1415    Gerhard MunchLockwood, Robert, MD 12/02/18 [phone]

## 2018-12-22 ENCOUNTER — Emergency Department (HOSPITAL_COMMUNITY)
Admission: EM | Admit: 2018-12-22 | Discharge: 2018-12-22 | Disposition: A | Discharge status: Home / Self Care | Payer: Medicare HMO | Attending: Emergency Medicine | Admitting: Emergency Medicine

## 2018-12-22 ENCOUNTER — Encounter (HOSPITAL_COMMUNITY): Payer: Self-pay | Admitting: Emergency Medicine

## 2018-12-22 DIAGNOSIS — M25579 Pain in unspecified ankle and joints of unspecified foot: Secondary | ICD-10-CM | POA: Diagnosis present

## 2018-12-22 DIAGNOSIS — Z5321 Procedure and treatment not carried out due to patient leaving prior to being seen by health care provider: Secondary | ICD-10-CM | POA: Insufficient documentation

## 2018-12-22 NOTE — ED Triage Notes (Signed)
Pt. Stated, I fell down some stairs 3 weeks ago and was not broken , but has not gotten any better.(rt)

## 2018-12-22 NOTE — ED Notes (Signed)
Patient came up to RN and states she is no longer waiting. Patient left ambulatory and in NAD.

## 2018-12-24 ENCOUNTER — Encounter: Payer: 59 | Admitting: Obstetrics and Gynecology

## 2018-12-26 ENCOUNTER — Emergency Department (HOSPITAL_COMMUNITY): Admission: EM | Admit: 2018-12-26 | Discharge: 2018-12-26 | Discharge status: Absent without leave | Payer: Medicare HMO

## 2018-12-26 ENCOUNTER — Other Ambulatory Visit: Payer: Self-pay

## 2019-02-24 ENCOUNTER — Emergency Department (HOSPITAL_COMMUNITY): Payer: Medicare HMO

## 2019-02-24 ENCOUNTER — Emergency Department (HOSPITAL_COMMUNITY)
Admission: EM | Admit: 2019-02-24 | Discharge: 2019-02-24 | Discharge status: Against Medical Advice | Payer: Medicare HMO | Attending: Emergency Medicine | Admitting: Emergency Medicine

## 2019-02-24 DIAGNOSIS — Z79899 Other long term (current) drug therapy: Secondary | ICD-10-CM | POA: Diagnosis not present

## 2019-02-24 DIAGNOSIS — J449 Chronic obstructive pulmonary disease, unspecified: Secondary | ICD-10-CM | POA: Insufficient documentation

## 2019-02-24 DIAGNOSIS — M25562 Pain in left knee: Secondary | ICD-10-CM

## 2019-02-24 DIAGNOSIS — L539 Erythematous condition, unspecified: Secondary | ICD-10-CM | POA: Diagnosis not present

## 2019-02-24 DIAGNOSIS — Z5320 Procedure and treatment not carried out because of patient's decision for unspecified reasons: Secondary | ICD-10-CM | POA: Insufficient documentation

## 2019-02-24 DIAGNOSIS — F17210 Nicotine dependence, cigarettes, uncomplicated: Secondary | ICD-10-CM | POA: Insufficient documentation

## 2019-02-24 DIAGNOSIS — I10 Essential (primary) hypertension: Secondary | ICD-10-CM | POA: Insufficient documentation

## 2019-02-24 LAB — CBC WITH DIFFERENTIAL/PLATELET
Abs Immature Granulocytes: 0.01 10*3/uL (ref 0.00–0.07)
Basophils Absolute: 0 10*3/uL (ref 0.0–0.1)
Basophils Relative: 1 %
Eosinophils Absolute: 0.1 10*3/uL (ref 0.0–0.5)
Eosinophils Relative: 1 %
HCT: 39.1 % (ref 36.0–46.0)
Hemoglobin: 12.8 g/dL (ref 12.0–15.0)
Immature Granulocytes: 0 %
Lymphocytes Relative: 36 %
Lymphs Abs: 2.2 10*3/uL (ref 0.7–4.0)
MCH: 30.3 pg (ref 26.0–34.0)
MCHC: 32.7 g/dL (ref 30.0–36.0)
MCV: 92.7 fL (ref 80.0–100.0)
Monocytes Absolute: 0.4 10*3/uL (ref 0.1–1.0)
Monocytes Relative: 7 %
Neutro Abs: 3.3 10*3/uL (ref 1.7–7.7)
Neutrophils Relative %: 55 %
Platelets: 315 10*3/uL (ref 150–400)
RBC: 4.22 MIL/uL (ref 3.87–5.11)
RDW: 13.8 % (ref 11.5–15.5)
WBC: 6 10*3/uL (ref 4.0–10.5)
nRBC: 0 % (ref 0.0–0.2)

## 2019-02-24 LAB — BASIC METABOLIC PANEL
Anion gap: 9 (ref 5–15)
BUN: 8 mg/dL (ref 8–23)
CO2: 29 mmol/L (ref 22–32)
Calcium: 9.5 mg/dL (ref 8.9–10.3)
Chloride: 102 mmol/L (ref 98–111)
Creatinine, Ser: 0.64 mg/dL (ref 0.44–1.00)
GFR calc Af Amer: >60 mL/min (ref >60)
GFR calc non Af Amer: >60 mL/min (ref >60)
Glucose, Bld: 93 mg/dL (ref 70–99)
Potassium: 3.3 mmol/L — ABNORMAL LOW (ref 3.5–5.1)
Sodium: 140 mmol/L (ref 135–145)

## 2019-02-24 LAB — SEDIMENTATION RATE: Sed Rate: 12 mm/hr (ref 0–22)

## 2019-02-24 LAB — SYNOVIAL CELL COUNT + DIFF, W/ CRYSTALS
Crystals, Fluid: NONE SEEN
Eosinophils-Synovial: 0 % (ref 0–1)
Lymphocytes-Synovial Fld: 10 % (ref 0–20)
Monocyte-Macrophage-Synovial Fluid: 65 % (ref 50–90)
Neutrophil, Synovial: 25 % (ref 0–25)
WBC, Synovial: 780 /mm3 — ABNORMAL HIGH (ref 0–200)

## 2019-02-24 LAB — C-REACTIVE PROTEIN: CRP: <0.8 mg/dL (ref <1.0)

## 2019-02-24 MED ORDER — LIDOCAINE HCL 2 % IJ SOLN
20.0000 mL | Freq: Once | INTRAMUSCULAR | Status: AC
  Administered 2019-02-24: 14:00:00 400 mg
  Filled 2019-02-24: qty 20

## 2019-02-24 MED ORDER — PREDNISONE 20 MG PO TABS: 40.0000 mg | ORAL_TABLET | Freq: Every day | ORAL | 0 refills | Status: DC

## 2019-02-24 MED ORDER — HYDROCODONE-ACETAMINOPHEN 5-325 MG PO TABS: 1.0000 | ORAL_TABLET | ORAL | 0 refills | Status: DC | PRN

## 2019-02-24 NOTE — Discharge Instructions (Addendum)
Please read attached information. If you experience any new or worsening signs or symptoms please return to the emergency room for evaluation. Please follow-up with your primary care provider or specialist as discussed. Please use medication prescribed only as directed and discontinue taking if you have any concerning signs or symptoms.   °

## 2019-02-24 NOTE — ED Triage Notes (Addendum)
Pt states yesterday after no known injury she noticed swelling and pain in her left knee. Knee is mildly swollen on inside of left knee.

## 2019-02-24 NOTE — ED Notes (Signed)
Family at bedside. 

## 2019-02-24 NOTE — ED Provider Notes (Signed)
[place] EMERGENCY DEPARTMENT Provider Note   CSN: [id number] Arrival date & time: 02/24/19  7026     History   Chief Complaint Chief Complaint  Patient presents with  . Knee Pain    HPI [name] is a 63 y.o. female.     HPI   63 year old female presents today with complaints of left knee pain.  She notes symptoms started yesterday with redness swelling and warmth to touch to her left knee.  She notes she is unable to flex the knee secondary to pain.  She denies any trauma to the knee.  She denies any history of the same.  No fever, no history of gout.  She is not on anticoagulants.   Past Medical History:  Diagnosis Date  . Arthritis   . Asthma   . Chest pain 2006  . Chest pain 10/2016  . Chronic back pain   . Hypertension   . Sciatica   . Shortness of breath    due to  smoking     Patient Active Problem List   Diagnosis Date Noted  . Pain of left upper extremity 12/28/2016  . Elevated TSH 11/08/2016  . Gastroesophageal reflux disease 11/08/2016  . Constipation 11/08/2016  . COPD (chronic obstructive pulmonary disease) ([place]) 11/08/2016  . Tobacco use 11/08/2016  . Atypical chest pain 11/07/2016  . Unstable angina ([place]) 01/07/2016  . HTN (hypertension) 01/07/2016  . Hypokalemia 11/06/2013  . Acute blood loss anemia 11/06/2013  . Osteoarthritis of left hip 11/05/2013  . 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 ABDOMINAL HYSTERECTOMY  1997  . CARDIAC CATHETERIZATION  2006  . HEMI-MICRODISCECTOMY LUMBAR LAMINECTOMY LEVEL 1  05/14/2012   Procedure: HEMI-MICRODISCECTOMY LUMBAR LAMINECTOMY LEVEL 1;  Surgeon: Tobi Bastos, MD;  Location: [facility];  Service: Orthopedics;  Laterality: N/A;  Hemi Laminectomy Microdiscectomy L4 - L5 Central (X-Ray)  . ingrown toenail removal    . TONSILLECTOMY     . TOTAL HIP ARTHROPLASTY Left 11/05/2013   Procedure: LEFT TOTAL HIP ARTHROPLASTY;  Surgeon: Tobi Bastos, MD;  Location: [facility];  Service: Orthopedics;  Laterality: Left;     OB History    Gravida  3   Para  3   Term      Preterm      AB      Living        SAB      TAB      Ectopic      Multiple      Live Births               Home Medications    Prior to Admission medications   Medication Sig Start Date End Date Taking? Authorizing Provider  albuterol (PROVENTIL HFA;VENTOLIN HFA) 108 (90 BASE) MCG/ACT inhaler Inhale 2 puffs into the lungs every 4 (four) hours as needed for wheezing or shortness of breath. 09/25/14   Noemi Chapel, MD  diclofenac sodium (VOLTAREN) 1 % GEL Apply 4 g topically 4 (four) times daily as needed (pain/swelling). To ankle. 12/01/18   Petrucelli, Samantha R, PA-C  enalapril (VASOTEC) 10 MG tablet TAKE 1 AND 1/2 TABLETS DAILY BY MOUTH 08/22/17   Ledell Noss, MD  HYDROcodone-acetaminophen (NORCO/VICODIN) 5-325 MG tablet Take 1 tablet by mouth every 4 (four) hours as needed. 02/24/19   Okey Regal, PA-C  ipratropium-albuterol (DUONEB) 0.5-2.5 (3) MG/3ML SOLN Nebulize 1 vial for inhalation twice daily 07/24/17   Eulah PontBlum, Nina, MD  Multiple Vitamin (MULTIVITAMIN WITH MINERALS) TABS tablet Take 1 tablet by mouth daily.    [provider]  predniSONE (DELTASONE) 20 MG tablet Take 2 tablets (40 mg total) by mouth daily. 02/24/19   Danese Dorsainvil, Tinnie GensJeffrey, PA-C  triamcinolone cream (KENALOG) 0.1 % Apply 1 application topically 2 (two) times daily. 07/06/18   Wieters, Junius CreamerHallie C, PA-C    Family History Family History  Problem Relation Age of Onset  . Heart attack Mother 757  . Hypertension Sister   . Diabetes type II Sister   . Tongue cancer Brother   . Heart attack Sister 6765  . Lung cancer Sister   . Hypertension Sister     Social History Social History   Tobacco Use  . Smoking status: Current Every Day Smoker    Packs/day: 0.25    Years:  30.00    Pack years: 7.50    Types: Cigarettes  . Smokeless tobacco: Never Used  . Tobacco comment: Smoking since teenage years  Substance Use Topics  . Alcohol use: No  . Drug use: No     Allergies   Patient has no known allergies.   Review of Systems Review of Systems  All other systems reviewed and are negative.   Physical Exam Updated Vital Signs BP 107/75 (BP Location: Right Arm)   Pulse 60   Temp 98.5 F (36.9 C) (Oral)   Resp 16   SpO2 99%   Physical Exam Vitals signs and nursing note reviewed.  Constitutional:      Appearance: She is well-developed.  HENT:     Head: Normocephalic and atraumatic.  Eyes:     General: No scleral icterus.       Right eye: No discharge.        Left eye: No discharge.     Conjunctiva/sclera: Conjunctivae normal.     Pupils: Pupils are equal, round, and reactive to light.  Neck:     Musculoskeletal: Normal range of motion.     Vascular: No JVD.     Trachea: No tracheal deviation.  Pulmonary:     Effort: Pulmonary effort is normal.     Breath sounds: No stridor.  Musculoskeletal:     Comments: Swelling noted to the left knee with minor redness and warmth to touch tenderness throughout the knee, unable to flex the knee past 150 degrees- no open wounds   Neurological:     Mental Status: She is alert and oriented to person, place, and time.     Coordination: Coordination normal.  Psychiatric:        Behavior: Behavior normal.        Thought Content: Thought content normal.        Judgment: Judgment normal.     ED Treatments / Results  Labs (all labs ordered are listed, but only abnormal results are displayed) Labs Reviewed  BASIC METABOLIC PANEL - Abnormal; Notable for the following components:      Result Value   Potassium 3.3 (*)    All other components within normal limits  SYNOVIAL CELL COUNT + DIFF, W/ CRYSTALS - Abnormal; Notable for the following components:   Appearance-Synovial CLOUDY (*)    WBC, Synovial  780 (*)    All other components within normal limits  BODY FLUID CULTURE  CBC WITH DIFFERENTIAL/PLATELET  SEDIMENTATION RATE  C-REACTIVE PROTEIN  GLUCOSE, BODY FLUID OTHER  EKG None  Radiology Dg Knee Complete 4 Views Left  Result Date: 02/24/2019 CLINICAL DATA:  Left knee pain for 2 days with no injury. EXAM: LEFT KNEE - COMPLETE 4+ VIEW COMPARISON:  Oct 03, 2016 FINDINGS: No fracture or dislocation identified. A small to moderate suprapatellar joint effusion is identified. No other acute abnormalities. No bony lesion or erosion. Minimal degenerative changes. IMPRESSION: Small to moderate suprapatellar joint effusion. No other significant abnormalities. Electronically Signed   By: Gerome Sam III M.D   On: 02/24/2019 08:28    Procedures Procedures (including critical care time)  Medications Ordered in ED Medications  lidocaine (XYLOCAINE) 2 % (with pres) injection 400 mg (400 mg Infiltration Given by Other 02/24/19 1351)     Initial Impression / Assessment and Plan / ED Course  I have reviewed the triage vital signs and the nursing notes.  Pertinent labs & imaging results that were available during my care of the patient were reviewed by me and considered in my medical decision making (see chart for details).          Assessment/Plan: 63 year old female presents today with swelling to her left knee.  She does have small amount of white blood cells but no signs of significant bacterial infection.  She also does not have any crystals indicating gout.  Patient did leave AGAINST MEDICAL ADVICE that she needed to get home to her dog.  I did follow-up with her on the telephone to let her know of her results.  I discussed the case with attending physician Adalberto Cole who agrees that patient would benefit from steroids, and outpatient orthopedic follow-up.  Patient was given medication, cautions of use of the medication.  She did note a remote history of borderline  diabetes blood sugars not elevated today.  She will continue to monitor her blood sugar at home returning immediately if she develops any new or worsening signs or symptoms.  She verbalized understanding and agreement to today's plan had no further questions or concerns   Final Clinical Impressions(s) / ED Diagnoses   Final diagnoses:  Acute pain of left knee    ED Discharge Orders         Ordered    HYDROcodone-acetaminophen (NORCO/VICODIN) 5-325 MG tablet  Every 4 hours PRN     02/24/19 1811    predniSONE (DELTASONE) 20 MG tablet  Daily     02/24/19 1811           Eyvonne Mechanic, PA-C 02/24/19 1816    Tilden Fossa, MD 02/26/19 [phone]

## 2019-02-24 NOTE — ED Notes (Signed)
Pt came to registration asking about a room, pt states she did not hear her name called. Discharge undone, pt waiting in the lobby

## 2019-02-25 LAB — GLUCOSE, BODY FLUID OTHER: Glucose, Body Fluid Other: 121 mg/dL

## 2019-02-27 LAB — BODY FLUID CULTURE: Culture: NO GROWTH

## 2019-03-03 ENCOUNTER — Telehealth: Payer: Self-pay | Admitting: *Deleted

## 2019-03-03 ENCOUNTER — Other Ambulatory Visit: Payer: Self-pay

## 2019-03-03 ENCOUNTER — Emergency Department (HOSPITAL_COMMUNITY)
Admission: EM | Admit: 2019-03-03 | Discharge: 2019-03-03 | Discharge status: Against Medical Advice | Payer: Medicare HMO | Attending: Emergency Medicine | Admitting: Emergency Medicine

## 2019-03-03 DIAGNOSIS — M25562 Pain in left knee: Secondary | ICD-10-CM | POA: Diagnosis present

## 2019-03-03 DIAGNOSIS — Z5321 Procedure and treatment not carried out due to patient leaving prior to being seen by health care provider: Secondary | ICD-10-CM | POA: Insufficient documentation

## 2019-03-03 NOTE — ED Triage Notes (Signed)
Pt here for evaluation of continued L knee pain without injury. Seen for same last week and had some fluid drained off. Painful to walk. Compliant with rx from last week.

## 2019-03-03 NOTE — Telephone Encounter (Signed)
Pt called me from HOME regarding orthopedic referral from last ED visit.  EDCM reviewed chart to find that pt was referred to: [name]  [phone]

## 2019-06-20 ENCOUNTER — Ambulatory Visit: Payer: Medicare HMO

## 2019-07-22 ENCOUNTER — Other Ambulatory Visit: Payer: Self-pay | Admitting: Family Medicine

## 2019-07-22 DIAGNOSIS — Z1231 Encounter for screening mammogram for malignant neoplasm of breast: Secondary | ICD-10-CM

## 2019-11-20 ENCOUNTER — Emergency Department (HOSPITAL_COMMUNITY): Payer: Medicare HMO

## 2019-11-20 ENCOUNTER — Encounter (HOSPITAL_COMMUNITY): Payer: Self-pay

## 2019-11-20 ENCOUNTER — Other Ambulatory Visit: Payer: Self-pay

## 2019-11-20 ENCOUNTER — Emergency Department (HOSPITAL_COMMUNITY)
Admission: EM | Admit: 2019-11-20 | Discharge: 2019-11-20 | Disposition: A | Discharge status: Home / Self Care | Payer: Medicare HMO | Attending: Emergency Medicine | Admitting: Emergency Medicine

## 2019-11-20 DIAGNOSIS — M549 Dorsalgia, unspecified: Secondary | ICD-10-CM | POA: Insufficient documentation

## 2019-11-20 DIAGNOSIS — R0789 Other chest pain: Secondary | ICD-10-CM | POA: Diagnosis not present

## 2019-11-20 DIAGNOSIS — R109 Unspecified abdominal pain: Secondary | ICD-10-CM | POA: Insufficient documentation

## 2019-11-20 LAB — BASIC METABOLIC PANEL
Anion gap: 10 (ref 5–15)
BUN: 10 mg/dL (ref 8–23)
CO2: 30 mmol/L (ref 22–32)
Calcium: 9.5 mg/dL (ref 8.9–10.3)
Chloride: 102 mmol/L (ref 98–111)
Creatinine, Ser: 0.7 mg/dL (ref 0.44–1.00)
GFR calc Af Amer: >60 mL/min (ref >60)
GFR calc non Af Amer: >60 mL/min (ref >60)
Glucose, Bld: 119 mg/dL — ABNORMAL HIGH (ref 70–99)
Potassium: 2.8 mmol/L — ABNORMAL LOW (ref 3.5–5.1)
Sodium: 142 mmol/L (ref 135–145)

## 2019-11-20 LAB — CBC
HCT: 41 % (ref 36.0–46.0)
Hemoglobin: 12.8 g/dL (ref 12.0–15.0)
MCH: 29.3 pg (ref 26.0–34.0)
MCHC: 31.2 g/dL (ref 30.0–36.0)
MCV: 93.8 fL (ref 80.0–100.0)
Platelets: 294 10*3/uL (ref 150–400)
RBC: 4.37 MIL/uL (ref 3.87–5.11)
RDW: 14.7 % (ref 11.5–15.5)
WBC: 5.9 10*3/uL (ref 4.0–10.5)
nRBC: 0 % (ref 0.0–0.2)

## 2019-11-20 LAB — TROPONIN I (HIGH SENSITIVITY): Troponin I (High Sensitivity): 3 ng/L (ref <18)

## 2019-11-20 MED ORDER — SODIUM CHLORIDE 0.9% FLUSH: 3.0000 mL | Freq: Once | INTRAVENOUS | Status: DC

## 2019-11-20 NOTE — ED Notes (Signed)
Pt called for room, no response from lobby 

## 2019-11-20 NOTE — ED Notes (Signed)
Pt called multiple times, not in lobby.

## 2019-11-20 NOTE — ED Triage Notes (Signed)
Patient c/o mid chest pain that started this AM. Patient also c/o mid upper abdominal pain, mid back pain and emesis x 1 week.

## 2019-11-21 ENCOUNTER — Encounter (HOSPITAL_COMMUNITY): Payer: Self-pay

## 2019-11-21 ENCOUNTER — Emergency Department (HOSPITAL_COMMUNITY)
Admission: EM | Admit: 2019-11-21 | Discharge: 2019-11-21 | Disposition: A | Discharge status: Home / Self Care | Payer: Medicare HMO | Source: Ambulatory Visit | Attending: Emergency Medicine | Admitting: Emergency Medicine

## 2019-11-21 ENCOUNTER — Other Ambulatory Visit: Payer: Self-pay

## 2019-11-21 DIAGNOSIS — R0789 Other chest pain: Secondary | ICD-10-CM | POA: Diagnosis present

## 2019-11-21 DIAGNOSIS — M545 Low back pain: Secondary | ICD-10-CM | POA: Diagnosis not present

## 2019-11-21 DIAGNOSIS — Z5321 Procedure and treatment not carried out due to patient leaving prior to being seen by health care provider: Secondary | ICD-10-CM | POA: Diagnosis not present

## 2019-11-21 DIAGNOSIS — R1010 Upper abdominal pain, unspecified: Secondary | ICD-10-CM | POA: Diagnosis not present

## 2019-11-21 DIAGNOSIS — R1110 Vomiting, unspecified: Secondary | ICD-10-CM | POA: Diagnosis not present

## 2019-11-21 NOTE — ED Notes (Signed)
Pt advised staff she was leaving.

## 2019-11-21 NOTE — ED Triage Notes (Signed)
Pt eloped from waiting area earlier tonight.   Presents with same sx.   Patient c/o mid chest pain that started this AM. Patient also c/o mid upper abdominal pain, mid back pain and emesis x 1 week.

## 2019-11-21 NOTE — ED Notes (Signed)
Pt refused to have EKG or blood work

## 2019-11-21 NOTE — ED Notes (Signed)
Pt refuses new blood work and ECG.

## 2020-06-08 ENCOUNTER — Other Ambulatory Visit: Payer: Self-pay

## 2020-06-08 ENCOUNTER — Emergency Department (HOSPITAL_COMMUNITY)
Admission: EM | Admit: 2020-06-08 | Discharge: 2020-06-08 | Disposition: A | Discharge status: Home / Self Care | Payer: Medicare HMO | Attending: Emergency Medicine | Admitting: Emergency Medicine

## 2020-06-08 DIAGNOSIS — M7989 Other specified soft tissue disorders: Secondary | ICD-10-CM | POA: Diagnosis present

## 2020-06-08 DIAGNOSIS — Z5321 Procedure and treatment not carried out due to patient leaving prior to being seen by health care provider: Secondary | ICD-10-CM | POA: Insufficient documentation

## 2020-06-08 DIAGNOSIS — M25562 Pain in left knee: Secondary | ICD-10-CM | POA: Diagnosis not present

## 2020-06-08 NOTE — ED Triage Notes (Signed)
Pt from home for eval of L knee pain/swelling. Hx of same, has had to have it drained before. Pt ambulatory.

## 2020-06-08 NOTE — ED Notes (Signed)
Pt said she will call her provider for her knee to be drained.

## 2020-06-20 ENCOUNTER — Ambulatory Visit (HOSPITAL_COMMUNITY)
Admission: EM | Admit: 2020-06-20 | Discharge: 2020-06-20 | Disposition: A | Discharge status: Home / Self Care | Payer: Medicare HMO | Attending: Emergency Medicine | Admitting: Emergency Medicine

## 2020-06-20 ENCOUNTER — Other Ambulatory Visit: Payer: Self-pay

## 2020-06-20 ENCOUNTER — Encounter (HOSPITAL_COMMUNITY): Payer: Self-pay

## 2020-06-20 DIAGNOSIS — J441 Chronic obstructive pulmonary disease with (acute) exacerbation: Secondary | ICD-10-CM | POA: Diagnosis not present

## 2020-06-20 MED ORDER — PREDNISONE 10 MG PO TABS
40.0000 mg | ORAL_TABLET | Freq: Every day | ORAL | 0 refills | Status: AC
Start: 2020-06-20 — End: 2020-06-25

## 2020-06-20 MED ORDER — ALBUTEROL SULFATE HFA 108 (90 BASE) MCG/ACT IN AERS
2.0000 | INHALATION_SPRAY | RESPIRATORY_TRACT | 0 refills | Status: DC | PRN
Start: 2020-06-20 — End: 2021-06-10

## 2020-06-20 MED ORDER — ALBUTEROL SULFATE (2.5 MG/3ML) 0.083% IN NEBU
2.5000 mg | INHALATION_SOLUTION | Freq: Four times a day (QID) | RESPIRATORY_TRACT | 12 refills | Status: DC | PRN
Start: 2020-06-20 — End: 2021-06-10

## 2020-06-20 NOTE — ED Triage Notes (Signed)
Pt c/o asthma flare up. She states she is SOB when walking and states she is out of her albuterol.

## 2020-06-20 NOTE — ED Provider Notes (Signed)
[facility]    CSN: [id number] Arrival date & time: 06/20/20  1705      History   Chief Complaint Chief Complaint  Patient presents with  . Shortness of Breath    HPI [name] is a 65 y.o. female.   Patient presents with 2-day history of nonproductive cough, shortness of breath, fatigue.  She states she is out of her albuterol solution and inhaler and needs a refill.  She denies fever, chills, vomiting, diarrhea, or other symptoms.  Her medical history includes COPD, hypertension, osteoarthritis, anemia.  She is a current every day smoker.  The history is provided by the patient and medical records.    Past Medical History:  Diagnosis Date  . Arthritis   . Asthma   . Chest pain 2006  . Chest pain 10/2016  . Chronic back pain   . Hypertension   . Sciatica   . Shortness of breath    due to  smoking     Patient Active Problem List   Diagnosis Date Noted  . Pain of left upper extremity 12/28/2016  . Elevated TSH 11/08/2016  . Gastroesophageal reflux disease 11/08/2016  . Constipation 11/08/2016  . COPD (chronic obstructive pulmonary disease) (HCC) 11/08/2016  . Tobacco use 11/08/2016  . Atypical chest pain 11/07/2016  . Unstable angina (HCC) 01/07/2016  . HTN (hypertension) 01/07/2016  . Hypokalemia 11/06/2013  . Acute blood loss anemia 11/06/2013  . Osteoarthritis of left hip 11/05/2013  . 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 ABDOMINAL HYSTERECTOMY  1997  . CARDIAC CATHETERIZATION  2006  . HEMI-MICRODISCECTOMY LUMBAR LAMINECTOMY LEVEL 1  05/14/2012   Procedure: HEMI-MICRODISCECTOMY LUMBAR LAMINECTOMY LEVEL 1;  Surgeon: Jacki Cones, MD;  Location: [facility];  Service: Orthopedics;  Laterality: N/A;  Hemi Laminectomy Microdiscectomy L4 - L5 Central (X-Ray)  . ingrown toenail removal    .  TONSILLECTOMY    . TOTAL HIP ARTHROPLASTY Left 11/05/2013   Procedure: LEFT TOTAL HIP ARTHROPLASTY;  Surgeon: Jacki Cones, MD;  Location: [facility];  Service: Orthopedics;  Laterality: Left;    OB History    Gravida  3   Para  3   Term      Preterm      AB      Living        SAB      IAB      Ectopic      Multiple      Live Births               Home Medications    Prior to Admission medications   Medication Sig Start Date End Date Taking? Authorizing Provider  albuterol (PROVENTIL) (2.5 MG/3ML) 0.083% nebulizer solution Take 3 mLs (2.5 mg total) by nebulization every 6 (six) hours as needed for wheezing or shortness of breath. 06/20/20  Yes Mickie Bail, NP  albuterol (VENTOLIN HFA) 108 (90 Base) MCG/ACT inhaler Inhale 2 puffs into the lungs every 4 (four) hours as needed for wheezing or shortness of breath. 06/20/20  Yes Mickie Bail, NP  predniSONE (DELTASONE) 10 MG tablet Take 4 tablets (40 mg total) by mouth daily for 5 days. 06/20/20 06/25/20 Yes Mickie Bail, NP  diclofenac sodium (VOLTAREN) 1 % GEL Apply 4 g topically 4 (four) times daily as needed (pain/swelling). To ankle. 12/01/18  Petrucelli, Samantha R, PA-C  enalapril (VASOTEC) 10 MG tablet TAKE 1 AND 1/2 TABLETS DAILY BY MOUTH 08/22/17   Eulah Pont, MD  ipratropium-albuterol (DUONEB) 0.5-2.5 (3) MG/3ML SOLN Nebulize 1 vial for inhalation twice daily 07/24/17   Eulah Pont, MD  Multiple Vitamin (MULTIVITAMIN WITH MINERALS) TABS tablet Take 1 tablet by mouth daily.    [provider]  triamcinolone cream (KENALOG) 0.1 % Apply 1 application topically 2 (two) times daily. 07/06/18   Wieters, Junius Creamer, PA-C    Family History Family History  Problem Relation Age of Onset  . Heart attack Mother 59  . Hypertension Sister   . Diabetes type II Sister   . Tongue cancer Brother   . Heart attack Sister 37  . Lung cancer Sister   . Hypertension Sister     Social History Social History   Tobacco Use   . Smoking status: Current Every Day Smoker    Packs/day: 0.25    Years: 30.00    Pack years: 7.50    Types: Cigarettes  . Smokeless tobacco: Never Used  . Tobacco comment: Smoking since teenage years  Vaping Use  . Vaping Use: Never used  Substance Use Topics  . Alcohol use: No  . Drug use: No     Allergies   Patient has no known allergies.   Review of Systems Review of Systems  Constitutional: Positive for fatigue. Negative for chills and fever.  HENT: Negative for ear pain and sore throat.   Eyes: Negative for pain and visual disturbance.  Respiratory: Positive for cough and shortness of breath.   Cardiovascular: Negative for chest pain and palpitations.  Gastrointestinal: Negative for abdominal pain, diarrhea and vomiting.  Genitourinary: Negative for dysuria and hematuria.  Musculoskeletal: Negative for arthralgias and back pain.  Skin: Negative for color change and rash.  Neurological: Negative for seizures and syncope.  All other systems reviewed and are negative.    Physical Exam Triage Vital Signs ED Triage Vitals  Enc Vitals Group     BP      Pulse      Resp      Temp      Temp src      SpO2      Weight      Height      Head Circumference      Peak Flow      Pain Score      Pain Loc      Pain Edu?      Excl. in GC?    No data found.  Updated Vital Signs BP 124/69 (BP Location: Left Arm)   Pulse 74   Temp 98.5 F (36.9 C) (Oral)   Resp 15   SpO2 98%   Visual Acuity Right Eye Distance:   Left Eye Distance:   Bilateral Distance:    Right Eye Near:   Left Eye Near:    Bilateral Near:     Physical Exam Vitals and nursing note reviewed.  Constitutional:      General: She is not in acute distress.    Appearance: She is well-developed and well-nourished. She is not ill-appearing.  HENT:     Head: Normocephalic and atraumatic.     Mouth/Throat:     Mouth: Mucous membranes are moist.  Eyes:     Conjunctiva/sclera: Conjunctivae  normal.  Cardiovascular:     Rate and Rhythm: Normal rate and regular rhythm.     Heart sounds: Normal heart sounds.  Pulmonary:     Effort: Pulmonary effort is normal. No respiratory distress.     Breath sounds: Normal breath sounds.     Comments: Diminished breath sounds throughout.  No respiratory distress.  Abdominal:     Palpations: Abdomen is soft.     Tenderness: There is no abdominal tenderness.  Musculoskeletal:        General: No edema.     Cervical back: Neck supple.  Skin:    General: Skin is warm and dry.  Neurological:     General: No focal deficit present.     Mental Status: She is alert and oriented to person, place, and time.     Gait: Gait normal.  Psychiatric:        Mood and Affect: Mood and affect and mood normal.        Behavior: Behavior normal.      UC Treatments / Results  Labs (all labs ordered are listed, but only abnormal results are displayed) Labs Reviewed - No data to display  EKG   Radiology No results found.  Procedures Procedures (including critical care time)  Medications Ordered in UC Medications - No data to display  Initial Impression / Assessment and Plan / UC Course  I have reviewed the triage vital signs and the nursing notes.  Pertinent labs & imaging results that were available during my care of the patient were reviewed by me and considered in my medical decision making (see chart for details).   COPD exacerbation.  Patient declined COVID test.  Refill on albuterol inhaler and nebulizer solution given.  Also treating with prednisone burst.  Instructed patient to follow-up with her PCP if her symptoms are not improving.  She agrees to plan of care.   Final Clinical Impressions(s) / UC Diagnoses   Final diagnoses:  COPD exacerbation (HCC)     Discharge Instructions     Take the prednisone and use the albuterol as directed.    Follow-up with your primary care provider if your symptoms are not improving.         ED Prescriptions    Medication Sig Dispense Auth. Provider   albuterol (PROVENTIL) (2.5 MG/3ML) 0.083% nebulizer solution Take 3 mLs (2.5 mg total) by nebulization every 6 (six) hours as needed for wheezing or shortness of breath. 75 mL Mickie Bail, NP   albuterol (VENTOLIN HFA) 108 (90 Base) MCG/ACT inhaler Inhale 2 puffs into the lungs every 4 (four) hours as needed for wheezing or shortness of breath. 18 g Mickie Bail, NP   predniSONE (DELTASONE) 10 MG tablet Take 4 tablets (40 mg total) by mouth daily for 5 days. 20 tablet Mickie Bail, NP     PDMP not reviewed this encounter.   Mickie Bail, NP 06/20/20 [place]

## 2020-06-20 NOTE — ED Notes (Signed)
Pt declined COVID testing because she did not want the swab in her nose.

## 2020-06-20 NOTE — Discharge Instructions (Signed)
Take the prednisone and use the albuterol as directed.    Follow-up with your primary care provider if your symptoms are not improving.

## 2020-07-20 ENCOUNTER — Emergency Department (HOSPITAL_COMMUNITY): Payer: Medicare HMO

## 2020-07-20 ENCOUNTER — Other Ambulatory Visit: Payer: Self-pay

## 2020-07-20 ENCOUNTER — Encounter (HOSPITAL_COMMUNITY): Payer: Self-pay | Admitting: Emergency Medicine

## 2020-07-20 ENCOUNTER — Emergency Department (HOSPITAL_COMMUNITY)
Admission: EM | Admit: 2020-07-20 | Discharge: 2020-07-20 | Disposition: A | Discharge status: Home / Self Care | Payer: Medicare HMO | Attending: Emergency Medicine | Admitting: Emergency Medicine

## 2020-07-20 DIAGNOSIS — J45909 Unspecified asthma, uncomplicated: Secondary | ICD-10-CM | POA: Diagnosis not present

## 2020-07-20 DIAGNOSIS — I10 Essential (primary) hypertension: Secondary | ICD-10-CM | POA: Insufficient documentation

## 2020-07-20 DIAGNOSIS — E876 Hypokalemia: Secondary | ICD-10-CM | POA: Diagnosis not present

## 2020-07-20 DIAGNOSIS — Z79899 Other long term (current) drug therapy: Secondary | ICD-10-CM | POA: Diagnosis not present

## 2020-07-20 DIAGNOSIS — F17210 Nicotine dependence, cigarettes, uncomplicated: Secondary | ICD-10-CM | POA: Diagnosis not present

## 2020-07-20 DIAGNOSIS — K5909 Other constipation: Secondary | ICD-10-CM | POA: Diagnosis not present

## 2020-07-20 DIAGNOSIS — Z7951 Long term (current) use of inhaled steroids: Secondary | ICD-10-CM | POA: Diagnosis not present

## 2020-07-20 DIAGNOSIS — R059 Cough, unspecified: Secondary | ICD-10-CM | POA: Diagnosis not present

## 2020-07-20 DIAGNOSIS — R072 Precordial pain: Secondary | ICD-10-CM | POA: Diagnosis not present

## 2020-07-20 DIAGNOSIS — R1013 Epigastric pain: Secondary | ICD-10-CM

## 2020-07-20 DIAGNOSIS — K5903 Drug induced constipation: Secondary | ICD-10-CM

## 2020-07-20 LAB — HEPATIC FUNCTION PANEL
ALT: 12 U/L (ref 0–44)
AST: 17 U/L (ref 15–41)
Albumin: 3.9 g/dL (ref 3.5–5.0)
Alkaline Phosphatase: 53 U/L (ref 38–126)
Bilirubin, Direct: 0.2 mg/dL (ref 0.0–0.2)
Indirect Bilirubin: 0.8 mg/dL (ref 0.3–0.9)
Total Bilirubin: 1 mg/dL (ref 0.3–1.2)
Total Protein: 6.4 g/dL — ABNORMAL LOW (ref 6.5–8.1)

## 2020-07-20 LAB — CBC
HCT: 40.8 % (ref 36.0–46.0)
Hemoglobin: 13.7 g/dL (ref 12.0–15.0)
MCH: 31.2 pg (ref 26.0–34.0)
MCHC: 33.6 g/dL (ref 30.0–36.0)
MCV: 92.9 fL (ref 80.0–100.0)
Platelets: 310 10*3/uL (ref 150–400)
RBC: 4.39 MIL/uL (ref 3.87–5.11)
RDW: 14.1 % (ref 11.5–15.5)
WBC: 4.4 10*3/uL (ref 4.0–10.5)
nRBC: 0 % (ref 0.0–0.2)

## 2020-07-20 LAB — BASIC METABOLIC PANEL
Anion gap: 11 (ref 5–15)
BUN: 9 mg/dL (ref 8–23)
CO2: 30 mmol/L (ref 22–32)
Calcium: 9.7 mg/dL (ref 8.9–10.3)
Chloride: 101 mmol/L (ref 98–111)
Creatinine, Ser: 0.76 mg/dL (ref 0.44–1.00)
GFR, Estimated: >60 mL/min (ref >60)
Glucose, Bld: 98 mg/dL (ref 70–99)
Potassium: 2.8 mmol/L — ABNORMAL LOW (ref 3.5–5.1)
Sodium: 142 mmol/L (ref 135–145)

## 2020-07-20 LAB — TROPONIN I (HIGH SENSITIVITY)
Troponin I (High Sensitivity): 6 ng/L (ref <18)
Troponin I (High Sensitivity): 5 ng/L (ref <18)

## 2020-07-20 LAB — PROTIME-INR
INR: 1 (ref 0.8–1.2)
Prothrombin Time: 12.6 seconds (ref 11.4–15.2)

## 2020-07-20 LAB — LIPASE, BLOOD: Lipase: 24 U/L (ref 11–51)

## 2020-07-20 MED ORDER — FAMOTIDINE 20 MG PO TABS
20.0000 mg | ORAL_TABLET | Freq: Once | ORAL | Status: AC
  Administered 2020-07-20: 20 mg via ORAL
  Filled 2020-07-20: qty 1

## 2020-07-20 MED ORDER — ONDANSETRON HCL 4 MG/2ML IJ SOLN
4.0000 mg | Freq: Once | INTRAMUSCULAR | Status: AC
  Administered 2020-07-20: 4 mg via INTRAVENOUS
  Filled 2020-07-20: qty 2

## 2020-07-20 MED ORDER — LIDOCAINE VISCOUS HCL 2 % MT SOLN
15.0000 mL | Freq: Once | OROMUCOSAL | Status: AC
  Administered 2020-07-20: 15 mL via ORAL
  Filled 2020-07-20: qty 15

## 2020-07-20 MED ORDER — GLYCERIN (ADULT) 2 G RE SUPP
1.0000 | RECTAL | 0 refills | Status: DC | PRN
Start: 2020-07-20 — End: 2020-10-05

## 2020-07-20 MED ORDER — DOCUSATE SODIUM 100 MG PO CAPS
100.0000 mg | ORAL_CAPSULE | Freq: Two times a day (BID) | ORAL | 0 refills | Status: DC
Start: 2020-07-20 — End: 2020-10-05

## 2020-07-20 MED ORDER — MORPHINE SULFATE (PF) 4 MG/ML IV SOLN
4.0000 mg | Freq: Once | INTRAVENOUS | Status: AC
  Administered 2020-07-20: 4 mg via INTRAVENOUS
  Filled 2020-07-20: qty 1

## 2020-07-20 MED ORDER — ALUM & MAG HYDROXIDE-SIMETH 200-200-20 MG/5ML PO SUSP
30.0000 mL | Freq: Once | ORAL | Status: AC
  Administered 2020-07-20: 30 mL via ORAL
  Filled 2020-07-20: qty 30

## 2020-07-20 MED ORDER — POTASSIUM CHLORIDE CRYS ER 20 MEQ PO TBCR
40.0000 meq | EXTENDED_RELEASE_TABLET | Freq: Once | ORAL | Status: AC
  Administered 2020-07-20: 40 meq via ORAL
  Filled 2020-07-20: qty 2

## 2020-07-20 MED ORDER — OMEPRAZOLE 20 MG PO CPDR
20.0000 mg | DELAYED_RELEASE_CAPSULE | Freq: Every day | ORAL | 0 refills | Status: DC
Start: 2020-07-20 — End: 2020-10-05

## 2020-07-20 MED ORDER — POLYETHYLENE GLYCOL 3350 17 GM/SCOOP PO POWD
17.0000 g | Freq: Two times a day (BID) | ORAL | 1 refills | Status: DC
Start: 2020-07-20 — End: 2020-10-05

## 2020-07-20 MED ORDER — PANTOPRAZOLE SODIUM 40 MG IV SOLR
40.0000 mg | Freq: Once | INTRAVENOUS | Status: AC
  Administered 2020-07-20: 40 mg via INTRAVENOUS
  Filled 2020-07-20: qty 40

## 2020-07-20 MED ORDER — IOHEXOL 300 MG/ML  SOLN
75.0000 mL | Freq: Once | INTRAMUSCULAR | Status: AC | PRN
  Administered 2020-07-20: 75 mL via INTRAVENOUS

## 2020-07-20 NOTE — Discharge Instructions (Signed)
Please read and follow all provided instructions.  Your diagnoses today include:  1. Epigastric pain   2. Precordial pain   3. Drug-induced constipation   4. Hypokalemia     Tests performed today include:  Blood cell counts and platelets  Kidney and liver function tests - showed low potassium  Pancreas function test (called lipase)  EKG and blood test for the heart - no sign of heart attack or stress on the heart  CT scan of the abdomen and pelvis -- showed constipation, also some small cysts on the liver and adrenal gland. You should just let your primary care doctor know about these  Vital signs. See below for your results today.   Medications prescribed:   Omeprazole (Prilosec) - stomach acid reducer  This medication can be found over-the-counter   Miralax - laxative  This medication can be found over-the-counter.    Colace - stool softener  This medication can be found over-the-counter.   Continue colace for 2 weeks after your stools return to normal to prevent constipation.   Take any prescribed medications only as directed.  Home care instructions:   Follow any educational materials contained in this packet.  Follow-up instructions: Please follow-up with your primary care provider in the next 1-2 weeks for further evaluation of your symptoms.    Return instructions:  SEEK IMMEDIATE MEDICAL ATTENTION IF:  The pain does not go away or becomes severe   A temperature above 101F develops   Repeated vomiting occurs (multiple episodes)   The pain becomes localized to portions of the abdomen. The right side could possibly be appendicitis. In an adult, the left lower portion of the abdomen could be colitis or diverticulitis.   Blood is being passed in stools or vomit (bright red or black tarry stools)   You develop chest pain, difficulty breathing, dizziness or fainting, or become confused, poorly responsive, or inconsolable (young children)  If you have  any other emergent concerns regarding your health  Additional Information: Abdominal (belly) pain can be caused by many things. Your caregiver performed an examination and possibly ordered blood/urine tests and imaging (CT scan, x-rays, ultrasound). Many cases can be observed and treated at home after initial evaluation in the emergency department. Even though you are being discharged home, abdominal pain can be unpredictable. Therefore, you need a repeated exam if your pain does not resolve, returns, or worsens. Most patients with abdominal pain don't have to be admitted to the hospital or have surgery, but serious problems like appendicitis and gallbladder attacks can start out as nonspecific pain. Many abdominal conditions cannot be diagnosed in one visit, so follow-up evaluations are very important.  Your vital signs today were: BP 133/78   Pulse (!) 56   Temp 97.9 F (36.6 C) (Oral)   Resp 19   Ht 5\' 1"  (1.549 m)   Wt 55 kg   SpO2 97%   BMI 22.91 kg/m  If your blood pressure (bp) was elevated above 135/85 this visit, please have this repeated by your doctor within one month. --------------

## 2020-07-20 NOTE — ED Notes (Signed)
Pt to xray

## 2020-07-20 NOTE — ED Notes (Signed)
Patient transported to CT 

## 2020-07-20 NOTE — ED Notes (Signed)
Provided bag lunch and drink.

## 2020-07-20 NOTE — ED Triage Notes (Signed)
Patient reports mid chest / epigastric pain onset this week with mild SOB and nausea , denies emesis or diaphoresis , no cough or fever .

## 2020-07-20 NOTE — ED Provider Notes (Signed)
[facility]   CSN: [id number] Arrival date & time: 07/20/20  [id number]     History Chief Complaint  Patient presents with   Chest Pain    Regina KaufmannRenee E Puello is a 65 y.o. female.  Patient with history of smoking, hypertension --presents the emergency department for evaluation of chest pain, abdominal pain, weight loss.  Patient reports approximately 20 to 30 pound weight loss over the past several months.  She states that she develops chest pain and abdominal pain whenever she sips on fluids or eat something.  These pains are both generalized.  Sometimes she feels pain into her left arm and states that she has to shake her arm in order to help make it feel better.  Pains have caused her appetite to be decreased.  She has not had any forceful vomiting.  She has a chronic cough that she attributes to asthma and COPD.  No hemoptysis.  She states that her symptoms have been ongoing for months.  Chest pain and abdominal pain have been intermittent.  She states that when the pain comes on she just tries to ignore it.  She is currently having pain.  She is unable to tell me when it started.  She denies fevers or URI symptoms.  She denies constipation or diarrhea.  She denies black or bloody stools.  She denies skin rashes or changes.  She is uncertain if she has been diagnosed with diabetes or high cholesterol in the past.  She does state that her mother had a history of heart disease and older sister as well.  No treatments prior to arrival.        Past Medical History:  Diagnosis Date   Arthritis    Asthma    Chest pain 2006   Chest pain 10/2016   Chronic back pain    Hypertension    Sciatica    Shortness of breath    due to  smoking     Patient Active Problem List   Diagnosis Date Noted   Pain of left upper extremity 12/28/2016   Elevated TSH 11/08/2016   Gastroesophageal reflux disease 11/08/2016   Constipation 11/08/2016    COPD (chronic obstructive pulmonary disease) (HCC) 11/08/2016   Tobacco use 11/08/2016   Atypical chest pain 11/07/2016   Unstable angina (HCC) 01/07/2016   HTN (hypertension) 01/07/2016   Hypokalemia 11/06/2013   Acute blood loss anemia 11/06/2013   Osteoarthritis of left hip 11/05/2013   History of total left hip replacement 11/05/2013   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    Past Surgical History:  Procedure Laterality Date   ABDOMINAL HYSTERECTOMY  1997   CARDIAC CATHETERIZATION  2006   HEMI-MICRODISCECTOMY LUMBAR LAMINECTOMY LEVEL 1  05/14/2012   Procedure: HEMI-MICRODISCECTOMY LUMBAR LAMINECTOMY LEVEL 1;  Surgeon: Jacki Conesonald A Gioffre, MD;  Location: [facility];  Service: Orthopedics;  Laterality: N/A;  Hemi Laminectomy Microdiscectomy L4 - L5 Central (X-Ray)   ingrown toenail removal     TONSILLECTOMY     TOTAL HIP ARTHROPLASTY Left 11/05/2013   Procedure: LEFT TOTAL HIP ARTHROPLASTY;  Surgeon: Jacki Conesonald A Gioffre, MD;  Location: [facility];  Service: Orthopedics;  Laterality: Left;     OB History    Gravida  3   Para  3   Term      Preterm      AB      Living        SAB  IAB      Ectopic      Multiple      Live Births              Family History  Problem Relation Age of Onset   Heart attack Mother 31   Hypertension Sister    Diabetes type II Sister    Tongue cancer Brother    Heart attack Sister 82   Lung cancer Sister    Hypertension Sister     Social History   Tobacco Use   Smoking status: Current Every Day Smoker    Packs/day: 0.25    Years: 30.00    Pack years: 7.50    Types: Cigarettes   Smokeless tobacco: Never Used   Tobacco comment: Smoking since teenage years  Vaping Use   Vaping Use: Never used  Substance Use Topics   Alcohol use: No   Drug use: No    Home Medications Prior to Admission medications   Medication Sig Start Date End Date Taking? Authorizing  Provider  albuterol (PROVENTIL) (2.5 MG/3ML) 0.083% nebulizer solution Take 3 mLs (2.5 mg total) by nebulization every 6 (six) hours as needed for wheezing or shortness of breath. 06/20/20   Mickie Bail, NP  albuterol (VENTOLIN HFA) 108 (90 Base) MCG/ACT inhaler Inhale 2 puffs into the lungs every 4 (four) hours as needed for wheezing or shortness of breath. 06/20/20   Mickie Bail, NP  diclofenac sodium (VOLTAREN) 1 % GEL Apply 4 g topically 4 (four) times daily as needed (pain/swelling). To ankle. 12/01/18   Petrucelli, Samantha R, PA-C  enalapril (VASOTEC) 10 MG tablet TAKE 1 AND 1/2 TABLETS DAILY BY MOUTH 08/22/17   Eulah Pont, MD  ipratropium-albuterol (DUONEB) 0.5-2.5 (3) MG/3ML SOLN Nebulize 1 vial for inhalation twice daily 07/24/17   Eulah Pont, MD  Multiple Vitamin (MULTIVITAMIN WITH MINERALS) TABS tablet Take 1 tablet by mouth daily.    [provider]  triamcinolone cream (KENALOG) 0.1 % Apply 1 application topically 2 (two) times daily. 07/06/18   Wieters, Hallie C, PA-C    Allergies    Patient has no known allergies.  Review of Systems   Review of Systems  Constitutional: Negative for fever.  HENT: Negative for rhinorrhea and sore throat.   Eyes: Negative for redness.  Respiratory: Positive for cough and shortness of breath.   Cardiovascular: Positive for chest pain.  Gastrointestinal: Positive for abdominal pain and nausea. Negative for blood in stool, constipation, diarrhea and vomiting.  Genitourinary: Negative for dysuria, frequency, hematuria and urgency.  Musculoskeletal: Negative for myalgias.  Skin: Negative for rash.  Neurological: Negative for headaches.    Physical Exam Updated Vital Signs BP 133/75 (BP Location: Right Arm)    Pulse 87    Temp 97.9 F (36.6 C) (Oral)    Resp 16    Ht 5\' 1"  (1.549 m)    Wt 55 kg    SpO2 98%    BMI 22.91 kg/m   Physical Exam Vitals and nursing note reviewed.  Constitutional:      General: She is not in acute distress.     Appearance: She is well-developed.  HENT:     Head: Normocephalic and atraumatic.     Right Ear: External ear normal.     Left Ear: External ear normal.     Nose: Nose normal.  Eyes:     Conjunctiva/sclera: Conjunctivae normal.  Cardiovascular:     Rate and Rhythm: Normal rate and regular rhythm.  Extrasystoles (infrequent) are present.    Heart sounds: No murmur heard.     Comments: HR regular during my exam.  Pulmonary:     Effort: No respiratory distress.     Breath sounds: No decreased breath sounds, wheezing, rhonchi or rales.  Abdominal:     Palpations: Abdomen is soft.     Tenderness: There is abdominal tenderness. There is no guarding or rebound.     Comments: Generalized tenderness, patient winces in pain when I press over each quadrant.   Musculoskeletal:     Cervical back: Normal range of motion and neck supple.     Right lower leg: No edema.     Left lower leg: No edema.  Skin:    General: Skin is warm and dry.     Findings: No rash.  Neurological:     General: No focal deficit present.     Mental Status: She is alert. Mental status is at baseline.     Motor: No weakness.  Psychiatric:        Mood and Affect: Mood normal.     ED Results / Procedures / Treatments   Labs (all labs ordered are listed, but only abnormal results are displayed) Labs Reviewed  BASIC METABOLIC PANEL - Abnormal; Notable for the following components:      Result Value   Potassium 2.8 (*)    All other components within normal limits  HEPATIC FUNCTION PANEL - Abnormal; Notable for the following components:   Total Protein 6.4 (*)    All other components within normal limits  CBC  PROTIME-INR  LIPASE, BLOOD  TROPONIN I (HIGH SENSITIVITY)  TROPONIN I (HIGH SENSITIVITY)    ED ECG REPORT   Date: 07/20/2020  Rate: 79  Rhythm: normal sinus rhythm with PACs possible etopic pacemaker  QRS Axis: normal  Intervals: normal  ST/T Wave abnormalities: normal  Conduction  Disutrbances:none  Narrative Interpretation:   Old EKG Reviewed: unchanged  I have personally reviewed the EKG tracing and disagree with the computerized printout as noted. Rhythm not afib.   Radiology DG Chest 2 View  Result Date: 07/20/2020 CLINICAL DATA:  Chest pain. EXAM: CHEST - 2 VIEW COMPARISON:  11/20/2019.  07/07/2018. FINDINGS: Mediastinum and hilar structures normal. Heart size normal. No focal infiltrate. Stable mild basilar pleural thickening most consistent with scarring degenerative change thoracic spine. IMPRESSION: No acute cardiopulmonary disease. Electronically Signed   By: Maisie Fus  Register   On: 07/20/2020 06:43    Procedures Procedures   Medications Ordered in ED Medications - No data to display  ED Course  I have reviewed the triage vital signs and the nursing notes.  Pertinent labs & imaging results that were available during my care of the patient were reviewed by me and considered in my medical decision making (see chart for details).  Patient seen and examined. Reviewed EKG with Dr. Blinda Leatherwood. \Work-up initiated. Medications ordered.   Vital signs reviewed and are as follows: BP 133/75 (BP Location: Right Arm)    Pulse 87    Temp 97.9 F (36.6 C) (Oral)    Resp 16    Ht 5\' 1"  (1.549 m)    Wt 55 kg    SpO2 98%    BMI 22.91 kg/m   7:43 AM Labs are reassuring. Potassium was 2.8 and PO potassium ordered. Will proceed with CT abd/pelvis given ongoing pain and weight loss.   Discussed patient with Dr. who will see.   9:10 AM discussed CT imaging  results with patient.  She does have a large amount of stool throughout her colon.  She states that she takes oral pain medications intermittently.  Review of [place] substance reporting database shows that she fills #180 Percocet 10-325 mg monthly.   At this point, I feel that patient needs to be on a bowel regimen to discern how much of her symptoms are related to the stool burden seen on CT.  Will start  on MiraLAX, Colace, glycerin suppositories.  Advised PCP follow-up in the next 1 to 2 weeks for continued evaluation and management.  I did discuss with the patient that if she continued to have epigastric pain, weight loss, or symptoms with eating --she may require further evaluation, potentially by gastroenterology.  Awaiting 2nd troponin --anticipate discharge if this continues to be normal.  The patient was urged to return to the Emergency Department immediately with worsening of current symptoms, worsening abdominal pain, persistent vomiting, blood noted in stools, fever, or any other concerns. The patient verbalized understanding.   11:22 AM Delay in obtaining 2nd trop. Now resulted: 6 >> 5. Plan for d/c as above.      MDM Rules/Calculators/A&P                          Patient with abdominal pain, chest pain. Vitals are stable, no fever. Labs reassuring, just shows low K which appears chronic. Imaging CXR clear, CT with signs of constipation, nothing emergent. Will treat constipation and start on PPI. She will need PCP follow-up. Cardiac work-up is negative. No signs of dehydration, patient is tolerating PO's. Lungs are clear and no signs suggestive of PNA. Low concern for appendicitis, cholecystitis, pancreatitis, ruptured viscus, UTI, kidney stone, aortic dissection, aortic aneurysm or other emergent abdominal etiology. Supportive therapy indicated with return if symptoms worsen.    Final Clinical Impression(s) / ED Diagnoses Final diagnoses:  Epigastric pain  Precordial pain  Drug-induced constipation  Hypokalemia    Rx / DC Orders ED Discharge Orders         Ordered    omeprazole (PRILOSEC) 20 MG capsule  Daily        07/20/20 0914    polyethylene glycol powder (GLYCOLAX/MIRALAX) 17 GM/SCOOP powder  2 times daily        07/20/20 0914    docusate sodium (COLACE) 100 MG capsule  Every 12 hours        07/20/20 0914    glycerin adult 2 g suppository  As needed         07/20/20 0914           Renne Crigler, PA-C 07/20/20 1124    [name], MD 07/20/20 [phone]

## 2020-10-04 ENCOUNTER — Emergency Department (HOSPITAL_COMMUNITY): Payer: Medicare HMO

## 2020-10-04 ENCOUNTER — Other Ambulatory Visit: Payer: Self-pay

## 2020-10-04 ENCOUNTER — Emergency Department (HOSPITAL_COMMUNITY)
Admission: EM | Admit: 2020-10-04 | Discharge: 2020-10-04 | Disposition: A | Discharge status: Home / Self Care | Payer: Medicare HMO | Attending: Emergency Medicine | Admitting: Emergency Medicine

## 2020-10-04 DIAGNOSIS — S99921A Unspecified injury of right foot, initial encounter: Secondary | ICD-10-CM | POA: Diagnosis present

## 2020-10-04 DIAGNOSIS — R0602 Shortness of breath: Secondary | ICD-10-CM | POA: Diagnosis not present

## 2020-10-04 DIAGNOSIS — R0781 Pleurodynia: Secondary | ICD-10-CM | POA: Diagnosis not present

## 2020-10-04 DIAGNOSIS — S92514A Nondisplaced fracture of proximal phalanx of right lesser toe(s), initial encounter for closed fracture: Secondary | ICD-10-CM | POA: Diagnosis not present

## 2020-10-04 DIAGNOSIS — F17210 Nicotine dependence, cigarettes, uncomplicated: Secondary | ICD-10-CM | POA: Diagnosis not present

## 2020-10-04 DIAGNOSIS — J449 Chronic obstructive pulmonary disease, unspecified: Secondary | ICD-10-CM | POA: Diagnosis not present

## 2020-10-04 DIAGNOSIS — J45909 Unspecified asthma, uncomplicated: Secondary | ICD-10-CM | POA: Diagnosis not present

## 2020-10-04 DIAGNOSIS — I10 Essential (primary) hypertension: Secondary | ICD-10-CM | POA: Diagnosis not present

## 2020-10-04 DIAGNOSIS — Z96642 Presence of left artificial hip joint: Secondary | ICD-10-CM | POA: Diagnosis not present

## 2020-10-04 DIAGNOSIS — Z79899 Other long term (current) drug therapy: Secondary | ICD-10-CM | POA: Insufficient documentation

## 2020-10-04 DIAGNOSIS — W108XXA Fall (on) (from) other stairs and steps, initial encounter: Secondary | ICD-10-CM | POA: Insufficient documentation

## 2020-10-04 MED ORDER — PREDNISONE 10 MG PO TABS: ORAL_TABLET | ORAL | 0 refills | Status: DC

## 2020-10-04 NOTE — ED Provider Notes (Signed)
[facility] EMERGENCY DEPARTMENT Provider Note   CSN: [id number] Arrival date & time: 10/04/20  1131     History Chief Complaint  Patient presents with  . Fall  . Shortness of Breath    [name] is a 65 y.o. female.  The history is provided by the patient. No language interpreter was used.  Fall This is a new problem. The problem occurs constantly. Associated symptoms include shortness of breath. Nothing aggravates the symptoms. Nothing relieves the symptoms. She has tried nothing for the symptoms. The treatment provided no relief.  Shortness of Breath  Pt reports she fell carrying her nebulizer.  Pt reports nebulizer hit her toe.  Pt complains of pain in her right ribs and her 5th toe.      Past Medical History:  Diagnosis Date  . Arthritis   . Asthma   . Chest pain 2006  . Chest pain 10/2016  . Chronic back pain   . Hypertension   . Sciatica   . Shortness of breath    due to  smoking     Patient Active Problem List   Diagnosis Date Noted  . Pain of left upper extremity 12/28/2016  . Elevated TSH 11/08/2016  . Gastroesophageal reflux disease 11/08/2016  . Constipation 11/08/2016  . COPD (chronic obstructive pulmonary disease) (HCC) 11/08/2016  . Tobacco use 11/08/2016  . Atypical chest pain 11/07/2016  . Unstable angina (HCC) 01/07/2016  . HTN (hypertension) 01/07/2016  . Hypokalemia 11/06/2013  . Acute blood loss anemia 11/06/2013  . Osteoarthritis of left hip 11/05/2013  . 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 ABDOMINAL HYSTERECTOMY  1997  . CARDIAC CATHETERIZATION  2006  . HEMI-MICRODISCECTOMY LUMBAR LAMINECTOMY LEVEL 1  05/14/2012   Procedure: HEMI-MICRODISCECTOMY LUMBAR LAMINECTOMY LEVEL 1;  Surgeon: Jacki Cones, MD;  Location: [facility];  Service: Orthopedics;  Laterality: N/A;   Hemi Laminectomy Microdiscectomy L4 - L5 Central (X-Ray)  . ingrown toenail removal    . TONSILLECTOMY    . TOTAL HIP ARTHROPLASTY Left 11/05/2013   Procedure: LEFT TOTAL HIP ARTHROPLASTY;  Surgeon: Jacki Cones, MD;  Location: [facility];  Service: Orthopedics;  Laterality: Left;     OB History    Gravida  3   Para  3   Term      Preterm      AB      Living        SAB      IAB      Ectopic      Multiple      Live Births              Family History  Problem Relation Age of Onset  . Heart attack Mother 103  . Hypertension Sister   . Diabetes type II Sister   . Tongue cancer Brother   . Heart attack Sister 35  . Lung cancer Sister   . Hypertension Sister     Social History   Tobacco Use  . Smoking status: Current Every Day Smoker    Packs/day: 0.25    Years: 30.00    Pack years: 7.50    Types: Cigarettes  . Smokeless tobacco: Never Used  . Tobacco comment: Smoking since teenage years  Vaping Use  . Vaping Use: Never used  Substance Use Topics  . Alcohol use:  No  . Drug use: No    Home Medications Prior to Admission medications   Medication Sig Start Date End Date Taking? Authorizing Provider  albuterol (PROVENTIL) (2.5 MG/3ML) 0.083% nebulizer solution Take 3 mLs (2.5 mg total) by nebulization every 6 (six) hours as needed for wheezing or shortness of breath. 06/20/20   Mickie Bail, NP  albuterol (VENTOLIN HFA) 108 (90 Base) MCG/ACT inhaler Inhale 2 puffs into the lungs every 4 (four) hours as needed for wheezing or shortness of breath. 06/20/20   Mickie Bail, NP  atorvastatin (LIPITOR) 20 MG tablet Take 20 mg by mouth daily. 06/18/20   [provider]  celecoxib (CELEBREX) 200 MG capsule Take 200 mg by mouth 2 (two) times daily as needed for mild pain. 06/07/20   [provider]  docusate sodium (COLACE) 100 MG capsule Take 1 capsule (100 mg total) by mouth every 12 (twelve) hours. 07/20/20   Renne Crigler, PA-C   enalapril-hydrochlorothiazide (VASERETIC) 10-25 MG tablet Take 1 tablet by mouth daily. 06/18/20   [provider]  glycerin adult 2 g suppository Place 1 suppository rectally as needed for constipation. 07/20/20   Renne Crigler, PA-C  omeprazole (PRILOSEC) 20 MG capsule Take 1 capsule (20 mg total) by mouth daily. 07/20/20   Renne Crigler, PA-C  oxyCODONE-acetaminophen (PERCOCET) 10-325 MG tablet Take 1 tablet by mouth every 4 (four) hours as needed for pain. 06/29/20   [provider]  polyethylene glycol powder (GLYCOLAX/MIRALAX) 17 GM/SCOOP powder Take 17 g by mouth 2 (two) times daily. 07/20/20   Renne Crigler, PA-C  zolpidem (AMBIEN) 5 MG tablet Take 5 mg by mouth at bedtime. 06/29/20   [provider]    Allergies    Patient has no known allergies.  Review of Systems   Review of Systems  Respiratory: Positive for shortness of breath.   All other systems reviewed and are negative.   Physical Exam Updated Vital Signs BP 107/81   Pulse (!) 59   Temp 98 F (36.7 C)   Resp 16   Ht 5\' 1"  (1.549 m)   Wt 55 kg   SpO2 100%   BMI 22.91 kg/m   Physical Exam Vitals and nursing note reviewed.  Constitutional:      Appearance: She is well-developed.  HENT:     Head: Normocephalic.  Cardiovascular:     Rate and Rhythm: Normal rate and regular rhythm.  Pulmonary:     Effort: Pulmonary effort is normal.     Breath sounds: No decreased breath sounds.  Chest:     Chest wall: Tenderness present.     Comments: Tender right chest, lungs clear  Abdominal:     General: There is no distension.  Musculoskeletal:        General: Normal range of motion.     Cervical back: Normal range of motion.  Skin:    General: Skin is warm.  Neurological:     General: No focal deficit present.     Mental Status: She is alert and oriented to person, place, and time.  Psychiatric:        Mood and Affect: Mood normal.     ED Results / Procedures / Treatments    Labs (all labs ordered are listed, but only abnormal results are displayed) Labs Reviewed - No data to display  EKG None  Radiology DG Chest 2 View  Result Date: 10/04/2020 CLINICAL DATA:  Shortness of breath for 3 weeks. Status post fall down  stairs. EXAM: CHEST - 2 VIEW COMPARISON:  PA and lateral chest 07/20/2020 FINDINGS: The lungs are emphysematous but clear. Heart size is normal. No pneumothorax or pleural effusion. No acute or focal bony abnormality. IMPRESSION: No acute disease. Emphysema (ICD10-J43.9). Electronically Signed   By: Drusilla Kanner M.D.   On: 10/04/2020 13:09   DG Foot Complete Right  Result Date: 10/04/2020 CLINICAL DATA:  pain over 4/5 metatarsals and phalanges last night, fell down 7 steps, nebulizer fell on foot as well EXAM: RIGHT FOOT COMPLETE - 3+ VIEW COMPARISON:  Right foot radiographs 12/01/2018 FINDINGS: There is a minimally displaced oblique longitudinal fracture of the fifth digit proximal phalanx, without definite articular involvement. Adjacent soft tissue swelling. Mild first MTP degenerative change. IMPRESSION: Minimally displaced oblique fracture of the fifth digit proximal phalanx. Electronically Signed   By: Caprice Renshaw   On: 10/04/2020 13:11    Procedures Procedures   Medications Ordered in ED Medications - No data to display  ED Course  I have reviewed the triage vital signs and the nursing notes.  Pertinent labs & imaging results that were available during my care of the patient were reviewed by me and considered in my medical decision making (see chart for details).    MDM Rules/Calculators/A&P                          MDM:  Pt counseled on results.  Pt advised to follow up with primary care for recheck  Final Clinical Impression(s) / ED Diagnoses Final diagnoses:  Closed nondisplaced fracture of proximal phalanx of lesser toe of right foot, initial encounter    Rx / DC Orders ED Discharge Orders    None    An After Visit  Summary was printed and given to the patient.    Elson Areas, [place] 10/04/20 1401    Tegeler, Canary Brim, MD 10/04/20 [phone]

## 2020-10-04 NOTE — Discharge Instructions (Signed)
Return if any problems.

## 2020-10-04 NOTE — ED Triage Notes (Signed)
Pt presents to the ED after falling down approx 7-8 stairs while carrying her breathing machine. The fall caused the patient to drop her breathing machine onto her right pinky toe causing severe pain. Right pinky toe swollen. Pt also requesting a chest xray due to a "a cold" she cannot shake. Resp even and unlab.

## 2020-10-05 ENCOUNTER — Ambulatory Visit: Payer: Medicare HMO | Admitting: Internal Medicine

## 2020-10-05 ENCOUNTER — Encounter: Payer: Self-pay | Admitting: Internal Medicine

## 2020-10-05 VITALS — BP 128/78 | HR 60 | Resp 16 | Ht 60.5 in | Wt 108.0 lb

## 2020-10-05 DIAGNOSIS — J439 Emphysema, unspecified: Secondary | ICD-10-CM

## 2020-10-05 DIAGNOSIS — Z639 Problem related to primary support group, unspecified: Secondary | ICD-10-CM

## 2020-10-05 DIAGNOSIS — F32A Depression, unspecified: Secondary | ICD-10-CM

## 2020-10-05 DIAGNOSIS — Z1231 Encounter for screening mammogram for malignant neoplasm of breast: Secondary | ICD-10-CM

## 2020-10-05 DIAGNOSIS — I10 Essential (primary) hypertension: Secondary | ICD-10-CM

## 2020-10-05 MED ORDER — ENALAPRIL MALEATE 20 MG PO TABS: 20.0000 mg | ORAL_TABLET | Freq: Every day | ORAL | 11 refills | Status: DC

## 2020-10-05 MED ORDER — ENALAPRIL-HYDROCHLOROTHIAZIDE 10-25 MG PO TABS: 1.0000 | ORAL_TABLET | Freq: Every day | ORAL | 3 refills | Status: DC

## 2020-10-05 NOTE — Patient Instructions (Signed)
For Nicotine gum:  When you feel need for smoking:  Place nicotine gum in mouth and chew until juicy, then stick between gum and cheek.  You will absorb the nicotine from your mouth.   Do not aggressively chew gum. Spit gum out in garbage once you feel you are done with it.  Tobacco Cessation:   1800QUITNOW or [phone], the former for support and possibly free nicotine patches/gum and support; the latter for [facility] Smoking cessation class. Get rid of all smoking supplies:  Cigarettes, lighters, ashtrays--no stashes just in case at home if you are serious.

## 2020-10-05 NOTE — Progress Notes (Signed)
Social worker met with new patient who is scheduled with Dr. Amil Amen for medical visit to reestablish care. Social worker completed New Patient Questionnaire which included completion of housing, intimate partner violence, transportation needs, stress, Emergency planning/management officer strain, food insecurity and screeners. Social History   Socioeconomic History  . Marital status: Single    Spouse name: Not on file  . Number of children: Not on file  . Years of education: Not on file  . Highest education level: Not on file  Occupational History  . Occupation: Retired  Tobacco Use  . Smoking status: Current Every Day Smoker    Packs/day: 0.25    Years: 30.00    Pack years: 7.50    Types: Cigarettes  . Smokeless tobacco: Never Used  . Tobacco comment: Smoking since teenage years  Vaping Use  . Vaping Use: Never used  Substance and Sexual Activity  . Alcohol use: No  . Drug use: No  . Sexual activity: Never  Other Topics Concern  . 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 Difficulty of Paying Living Expenses: Not hard at all  Food Insecurity: Not on file  Transportation Needs: No Transportation Needs  . Lack of Transportation (Medical): No  . Lack of Transportation (Non-Medical): No  Physical Activity: Not on file  Stress: No Stress Concern Present  . Feeling of Stress : Not at all  Social Connections: Moderately Integrated  . Frequency of Communication with Friends and Family: More than three times a week  . Frequency of Social Gatherings with Friends and Family: Three times a week  . Attends Religious Services: More than 4 times per year  . Active Member of Clubs or Organizations: Yes  . Attends Archivist Meetings: More than 4 times per year  . Marital Status: Widowed    Depression screen [facility] 2/9  10/05/2020 07/24/2017 12/28/2016 11/08/2016  Decreased Interest 0 0 0 0  Down, Depressed, Hopeless 0 0 0 0  PHQ - 2 Score 0 0 0 0  Altered sleeping 0 - - -  Tired, decreased energy 0 - - -  Change in appetite 0 - - -  Feeling bad or failure about yourself  0 - - -  Trouble concentrating 0 - - -  Moving slowly or fidgety/restless 0 - - -  Suicidal thoughts 0 - - -  PHQ-9 Score 0 - - -  Difficult doing work/chores Not difficult at all - - -    GAD 7 : Generalized Anxiety Score 10/05/2020  Nervous, Anxious, on Edge 0  Control/stop worrying 0  Worry too much - different things 0  Trouble relaxing 0  Restless 0  Easily annoyed or irritable 0  Afraid - awful might happen 0  Total GAD 7 Score 0  Anxiety Difficulty Not difficult at all     Based on presentation no current concerns. LCSW provided reflective listening as patient shared where she works and how she enjoys it. LCSW provided patient card and informed if needed can contact clinic.

## 2020-10-05 NOTE — Progress Notes (Signed)
Subjective:    Patient ID: [name], female   DOB: [birth year], 65 y.o.   MRN: [id number]   HPI   Here to re establish  Was going to [facility], but having difficulties with transportation.    1.  Hypertension;  Has been taking Enalapril-HCTZ 10-25 mg daily.  Needs a refill.  Does have problems with hypokalemia.  She apparently has potassium pills at home to take when her potassium drops;  2.  Prediabetes:  Was taking Metformin last year, but only took once and felt it caused diarrhea, but did not continue the med as she did not believe her physician.  She wants this rechecked.  States she has lost a lot of weight and does not like her weight currently. Weight was 145 lbs on 08/20/2016. States she just did not have an appetite.  3.  Chronic back pain with left radiculopathy:  Treated by Dr. Malen Gauze at [facility] for pain.  Percocet and Ambien.  She was also followed there by PCP.    4.  COPD:  Still smoking 3 cigarettes daily.  Would be willing to try nicotine gum.  We discussed this in the past when she was followed here and could never get her to follow through.  Uses albuterol twice daily generally.   Does cough up white mucous about 1/2 tbsp daily.     5.  Hyperlipidemia:  Not clear she is taking Atorvastatin.  Sounds very intermittent.   6.  History of elevated TSH with normal FT4 in 2018.  She does not know if every rechecked.    7.  HM:  Has had Pfizer x 3, last in April..  No mammogram in years.  Current Meds  Medication Sig   albuterol (PROVENTIL) (2.5 MG/3ML) 0.083% nebulizer solution Take 3 mLs (2.5 mg total) by nebulization every 6 (six) hours as needed for wheezing or shortness of breath.   albuterol (VENTOLIN HFA) 108 (90 Base) MCG/ACT inhaler Inhale 2 puffs into the lungs every 4 (four) hours as needed for wheezing or shortness of breath.   enalapril-hydrochlorothiazide (VASERETIC) 10-25 MG tablet Take 1 tablet by mouth daily.    oxyCODONE-acetaminophen (PERCOCET) 10-325 MG tablet Take 1 tablet by mouth every 4 (four) hours as needed for pain.   predniSONE (DELTASONE) 10 MG tablet [id number] taper   zolpidem (AMBIEN) 5 MG tablet Take 5 mg by mouth at bedtime.   No Known Allergies   Review of Systems    Objective:   BP 128/78 (BP Location: Left Arm, Patient Position: Sitting, Cuff Size: Normal)   Pulse 60   Resp 16   Ht 5' 0.5" (1.537 m)   Wt 108 lb (49 kg)   BMI 20.75 kg/m   Physical Exam NAD HEENT:  PERRL, EOMI Neck:  Supple, No adenopathy, no thyromegaly Chest:  CTA CV:  RRR with normal S1 and S2, No S3, S4 or murmur.  No carotid bruit.  Carotid, radial and DP pulses normal and equal Abd:  S, NT, No HSM or mass, + BS LE:  No edema   Assessment & Plan   Hypertension:  controlled.  No problem with LE edema, but does have issues with hypokalemia in past.  Change to enalapril without HCTZ.   Follow up CMP and CBC in 2 weeks with bp check.  2.  Prediabetes:  A1C with upcoming labs.  Has lost substantial weight since last checked.    3.  History of abnormal TSH:  TSH and Free T4 with labs.  4.  Hyperlipidemia:   not clear consistent with use of Atorvastatin.  FLP with labs  5.  HM:  COVID vaccination up to date.  Mammogram ordered.  6.  Brings up family dysfunction at end of visit with depression at times.  Referral to Danton Clap, LCSW for counseling.

## 2020-10-14 ENCOUNTER — Telehealth: Payer: Self-pay | Admitting: Clinical

## 2020-10-14 NOTE — Telephone Encounter (Signed)
LCSW contacted patient as a referral from PCP for counseling. LCSW left message if she was interested to contact back to schedule an appointment.

## 2020-10-21 ENCOUNTER — Emergency Department (HOSPITAL_COMMUNITY): Payer: Medicare HMO

## 2020-10-21 ENCOUNTER — Emergency Department (HOSPITAL_COMMUNITY)
Admission: EM | Admit: 2020-10-21 | Discharge: 2020-10-21 | Disposition: A | Discharge status: Home / Self Care | Payer: Medicare HMO | Attending: Emergency Medicine | Admitting: Emergency Medicine

## 2020-10-21 ENCOUNTER — Encounter (HOSPITAL_COMMUNITY): Payer: Self-pay | Admitting: Emergency Medicine

## 2020-10-21 ENCOUNTER — Other Ambulatory Visit: Payer: Self-pay

## 2020-10-21 DIAGNOSIS — J9801 Acute bronchospasm: Secondary | ICD-10-CM | POA: Insufficient documentation

## 2020-10-21 DIAGNOSIS — Z20822 Contact with and (suspected) exposure to covid-19: Secondary | ICD-10-CM | POA: Diagnosis not present

## 2020-10-21 DIAGNOSIS — R079 Chest pain, unspecified: Secondary | ICD-10-CM | POA: Diagnosis present

## 2020-10-21 DIAGNOSIS — Z96642 Presence of left artificial hip joint: Secondary | ICD-10-CM | POA: Insufficient documentation

## 2020-10-21 DIAGNOSIS — F17210 Nicotine dependence, cigarettes, uncomplicated: Secondary | ICD-10-CM | POA: Insufficient documentation

## 2020-10-21 DIAGNOSIS — R058 Other specified cough: Secondary | ICD-10-CM

## 2020-10-21 DIAGNOSIS — Z79899 Other long term (current) drug therapy: Secondary | ICD-10-CM | POA: Diagnosis not present

## 2020-10-21 DIAGNOSIS — I10 Essential (primary) hypertension: Secondary | ICD-10-CM | POA: Insufficient documentation

## 2020-10-21 DIAGNOSIS — J449 Chronic obstructive pulmonary disease, unspecified: Secondary | ICD-10-CM | POA: Insufficient documentation

## 2020-10-21 LAB — CBC
HCT: 40.7 % (ref 36.0–46.0)
Hemoglobin: 12.7 g/dL (ref 12.0–15.0)
MCH: 29.5 pg (ref 26.0–34.0)
MCHC: 31.2 g/dL (ref 30.0–36.0)
MCV: 94.4 fL (ref 80.0–100.0)
Platelets: 317 10*3/uL (ref 150–400)
RBC: 4.31 MIL/uL (ref 3.87–5.11)
RDW: 14.8 % (ref 11.5–15.5)
WBC: 5.2 10*3/uL (ref 4.0–10.5)
nRBC: 0 % (ref 0.0–0.2)

## 2020-10-21 LAB — BASIC METABOLIC PANEL
Anion gap: 4 — ABNORMAL LOW (ref 5–15)
BUN: 9 mg/dL (ref 8–23)
CO2: 31 mmol/L (ref 22–32)
Calcium: 9.1 mg/dL (ref 8.9–10.3)
Chloride: 107 mmol/L (ref 98–111)
Creatinine, Ser: 0.73 mg/dL (ref 0.44–1.00)
GFR, Estimated: >60 mL/min (ref >60)
Glucose, Bld: 87 mg/dL (ref 70–99)
Potassium: 3.9 mmol/L (ref 3.5–5.1)
Sodium: 142 mmol/L (ref 135–145)

## 2020-10-21 LAB — TROPONIN I (HIGH SENSITIVITY): Troponin I (High Sensitivity): 9 ng/L (ref <18)

## 2020-10-21 LAB — RESP PANEL BY RT-PCR (FLU A&B, COVID) ARPGX2
Influenza A by PCR: NEGATIVE
Influenza B by PCR: NEGATIVE
SARS Coronavirus 2 by RT PCR: NEGATIVE

## 2020-10-21 MED ORDER — ALBUTEROL SULFATE HFA 108 (90 BASE) MCG/ACT IN AERS
2.0000 | INHALATION_SPRAY | Freq: Once | RESPIRATORY_TRACT | Status: AC
  Administered 2020-10-21: 2 via RESPIRATORY_TRACT
  Filled 2020-10-21: qty 6.7

## 2020-10-21 MED ORDER — IPRATROPIUM-ALBUTEROL 0.5-2.5 (3) MG/3ML IN SOLN
3.0000 mL | Freq: Once | RESPIRATORY_TRACT | Status: AC
  Administered 2020-10-21: 3 mL via RESPIRATORY_TRACT
  Filled 2020-10-21: qty 3

## 2020-10-21 MED ORDER — PREDNISONE 50 MG PO TABS: 50.0000 mg | ORAL_TABLET | Freq: Every day | ORAL | 0 refills | Status: AC

## 2020-10-21 MED ORDER — IBUPROFEN 400 MG PO TABS
400.0000 mg | ORAL_TABLET | Freq: Once | ORAL | Status: AC
  Administered 2020-10-21: 400 mg via ORAL
  Filled 2020-10-21: qty 1

## 2020-10-21 MED ORDER — ACETAMINOPHEN 325 MG PO TABS
650.0000 mg | ORAL_TABLET | Freq: Once | ORAL | Status: AC
  Administered 2020-10-21: 650 mg via ORAL
  Filled 2020-10-21: qty 2

## 2020-10-21 NOTE — ED Triage Notes (Signed)
Pt reports substernal chest pain and SOB X 3 days along w/ a congested cough. Pt is tearful in triage.  Has had cough for month.

## 2020-10-21 NOTE — Discharge Instructions (Signed)
Your work-up today was overall reassuring and your breathing improved after treatment.  Initially, we recommended a second troponin however as you are feeling better and want to go home, we do feel it is reasonable to let you go home given resolution of symptoms.  Please take the steroids for the neck several days to help treat your reactive airway disease exacerbation and please follow-up with your primary doctor.  If you have recurrent symptoms, please return to the nearest emergency department to get another troponin and for further monitoring.

## 2020-10-21 NOTE — ED Notes (Signed)
Called Xray, made them aware pt has been roomed to 19.

## 2020-10-21 NOTE — ED Provider Notes (Signed)
7:22 AM Care assumed from Dr. Preston Fleeting.  At time of transfer care, patient is awaiting for delta troponin, COVID test, and reassessment after DuoNeb treatment.  Suspect exacerbation of reactive airway disease.  If patient is breathing better and work-up remains reassuring, anticipate discharge with burst of steroids and PCP follow-up per previous team's plan.  8:12 AM Patient reports she is now breathing better and does not want to wait for delta troponin.  We acknowledge that her COVID and flu are negative and she agrees with getting a prescription for steroids for several days and will follow-up with PCP.  Patient otherwise or concerns and was breathing better.  Patient was not hypoxic or in respiratory stress and we agree with discharge home.  Patient discharged in good condition with understanding return precautions and follow-up instructions.  Clinical Impression: 1. Nonspecific chest pain   2. Productive cough   3. Bronchospasm     Disposition: Discharge  Condition: Good  I have discussed the results, Dx and Tx plan with the pt(& family if present). He/she/they expressed understanding and agree(s) with the plan. Discharge instructions discussed at great length. Strict return precautions discussed and pt &/or family have verbalized understanding of the instructions. No further questions at time of discharge.    New Prescriptions   PREDNISONE (DELTASONE) 50 MG TABLET    Take 1 tablet (50 mg total) by mouth daily for 5 days.    Follow Up: Julieanne Manson, MD [address] [place] [address] [phone]     [facility] EMERGENCY DEPARTMENT [address] [id number] mc [place] [address] [phone]       Montasia Chisenhall, Canary Brim, MD 10/21/20 [phone]

## 2020-10-21 NOTE — ED Notes (Signed)
Patient refused 2nd blood draw, The Nurse was informed.

## 2020-10-21 NOTE — ED Notes (Signed)
Pt d/c home per MD order. Discharge summary reviewed, pt verbalizes understanding. No s/s of acute distress noted. At discharge. Ambulatory off unit.

## 2020-10-21 NOTE — ED Provider Notes (Signed)
[facility] EMERGENCY DEPARTMENT Provider Note   CSN: [id number] Arrival date & time: 10/21/20  0544     History Chief Complaint  Patient presents with  . Chest Pain  . Shortness of Breath  . Cough    [name] is a 65 y.o. female.  The history is provided by the patient.  Chest Pain Associated symptoms: cough and shortness of breath   Shortness of Breath Associated symptoms: chest pain and cough   Cough Associated symptoms: chest pain and shortness of breath   She has history of hypertension, hyperlipidemia, asthma and comes in because of difficulty breathing and chest pain.  She has been sick for the last 3weeks.  She has had a cough productive of clear sputum.  She has been having gradually worsening shortness of breath.  She has also had intermittent chest pain which is described as a heavy feeling.  Nothing makes her symptoms better, nothing makes them worse.  She rates her pain at 9/10.  She has not taken any medication for it.  She has been awakened several times because of her shortness of breath.  She denies nausea, vomiting, fever, chills, sweats.  She is a cigarette smoker.  She denies history of diabetes   Past Medical History:  Diagnosis Date  . Arthritis   . Asthma   . Chest pain 2006  . Chest pain 10/2016  . Chronic back pain   . Hypertension   . Sciatica   . Shortness of breath    due to  smoking     Patient Active Problem List   Diagnosis Date Noted  . Pain of left upper extremity 12/28/2016  . Elevated TSH 11/08/2016  . Gastroesophageal reflux disease 11/08/2016  . Constipation 11/08/2016  . COPD (chronic obstructive pulmonary disease) (HCC) 11/08/2016  . Tobacco use 11/08/2016  . Atypical chest pain 11/07/2016  . Unstable angina (HCC) 01/07/2016  . HTN (hypertension) 01/07/2016  . Hypokalemia 11/06/2013  . Acute blood loss anemia 11/06/2013  . Osteoarthritis of left hip 11/05/2013  . History of total left hip replacement  11/05/2013  . 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 ABDOMINAL HYSTERECTOMY  1997  . CARDIAC CATHETERIZATION  2006  . HEMI-MICRODISCECTOMY LUMBAR LAMINECTOMY LEVEL 1  05/14/2012   Procedure: HEMI-MICRODISCECTOMY LUMBAR LAMINECTOMY LEVEL 1;  Surgeon: Jacki Cones, MD;  Location: [facility];  Service: Orthopedics;  Laterality: N/A;  Hemi Laminectomy Microdiscectomy L4 - L5 Central (X-Ray)  . ingrown toenail removal    . TONSILLECTOMY    . TOTAL HIP ARTHROPLASTY Left 11/05/2013   Procedure: LEFT TOTAL HIP ARTHROPLASTY;  Surgeon: Jacki Cones, MD;  Location: [facility];  Service: Orthopedics;  Laterality: Left;     OB History    Gravida  3   Para  3   Term      Preterm      AB      Living        SAB      IAB      Ectopic      Multiple      Live Births              Family History  Problem Relation Age of Onset  . Heart attack Mother 21  . Hypertension Sister   . Diabetes type II Sister   . Tongue cancer Brother   . Heart attack Sister  65  . Lung cancer Sister   . Hypertension Sister     Social History   Tobacco Use  . Smoking status: Current Every Day Smoker    Packs/day: 0.10    Years: 30.00    Pack years: 3.00    Types: Cigarettes  . Smokeless tobacco: Never Used  . Tobacco comment: Smoking since teenage years  Vaping Use  . Vaping Use: Never used  Substance Use Topics  . Alcohol use: No  . Drug use: No    Home Medications Prior to Admission medications   Medication Sig Start Date End Date Taking? Authorizing Provider  albuterol (PROVENTIL) (2.5 MG/3ML) 0.083% nebulizer solution Take 3 mLs (2.5 mg total) by nebulization every 6 (six) hours as needed for wheezing or shortness of breath. 06/20/20   Mickie Bail, NP  albuterol (VENTOLIN HFA) 108 (90 Base) MCG/ACT inhaler Inhale 2 puffs into the lungs every 4 (four) hours as needed for wheezing or  shortness of breath. 06/20/20   Mickie Bail, NP  atorvastatin (LIPITOR) 20 MG tablet Take 20 mg by mouth daily. 06/18/20   [provider]  enalapril (VASOTEC) 20 MG tablet Take 1 tablet (20 mg total) by mouth daily. 10/05/20   Julieanne Manson, MD  oxyCODONE-acetaminophen (PERCOCET) 10-325 MG tablet Take 1 tablet by mouth every 4 (four) hours as needed for pain. 06/29/20   [provider]  predniSONE (DELTASONE) 10 MG tablet [id number] taper 10/04/20   Cheron Schaumann K, PA-C  zolpidem (AMBIEN) 5 MG tablet Take 5 mg by mouth at bedtime. 06/29/20   [provider]    Allergies    Patient has no known allergies.  Review of Systems   Review of Systems  Respiratory: Positive for cough and shortness of breath.   Cardiovascular: Positive for chest pain.  All other systems reviewed and are negative.   Physical Exam Updated Vital Signs BP 136/70 (BP Location: Right Arm)   Pulse (!) 58   Temp 98.3 F (36.8 C) (Oral)   Resp (!) 23   SpO2 96%   Physical Exam Vitals and nursing note reviewed.   65 year old female, resting comfortably and in no acute distress. Vital signs are significant for slightly elevated respiratory rate. Oxygen saturation is 96%, which is normal. Head is normocephalic and atraumatic. PERRLA, EOMI. Oropharynx is clear. Neck is nontender and supple without adenopathy or JVD. Back is nontender and there is no CVA tenderness. Lungs have coarse breath sounds throughout.  Mild expiratory wheezing is noted rather diffusely.  There are no rales or rhonchi. Chest is nontender. Heart has regular rate and rhythm without murmur. Abdomen is soft, flat, nontender without masses or hepatosplenomegaly and peristalsis is normoactive. Extremities have no cyanosis or edema, full range of motion is present. Skin is warm and dry without rash. Neurologic: Mental status is normal, cranial nerves are intact, there are no motor or sensory deficits.   ED Results /  Procedures / Treatments   Labs (all labs ordered are listed, but only abnormal results are displayed) Labs Reviewed  BASIC METABOLIC PANEL - Abnormal; Notable for the following components:      Result Value   Anion gap 4 (*)    All other components within normal limits  RESP PANEL BY RT-PCR (FLU A&B, COVID) ARPGX2  CBC  TROPONIN I (HIGH SENSITIVITY)  TROPONIN I (HIGH SENSITIVITY)    EKG EKG Interpretation  Date/Time:  Friday Oct 21 2020 05:50:48 EDT Ventricular Rate:  58 PR  Interval:  146 QRS Duration: 74 QT Interval:  374 QTC Calculation: 367 R Axis:   91 Text Interpretation: Sinus bradycardia with Premature atrial complexes Rightward axis Septal infarct , age undetermined Abnormal ECG Confirmed by Marily Memos [id number]) on 10/21/2020 5:55:51 AM   Radiology DG Chest 2 View  Result Date: 10/21/2020 CLINICAL DATA:  Chest x-ray.  Shortness of breath X 3 days. EXAM: CHEST - 2 VIEW COMPARISON:  Chest x-ray 10/04/2020 FINDINGS: The heart size and mediastinal contours are unchanged. Biapical pleural/pulmonary scarring. No focal consolidation. No pulmonary edema. No pleural effusion. No pneumothorax. No acute osseous abnormality. IMPRESSION: No active cardiopulmonary disease. Electronically Signed   By: Tish Frederickson M.D.   On: 10/21/2020 06:18    Procedures Procedures   Medications Ordered in ED Medications  albuterol (VENTOLIN HFA) 108 (90 Base) MCG/ACT inhaler 2 puff (has no administration in time range)  acetaminophen (TYLENOL) tablet 650 mg (has no administration in time range)    ED Course  I have reviewed the triage vital signs and the nursing notes.  Pertinent labs & imaging results that were available during my care of the patient were reviewed by me and considered in my medical decision making (see chart for details).   MDM Rules/Calculators/A&P                         Cough, dyspnea, chest pain.  This seems to be related to bronchospasm.  Chest x-ray shows no  evidence of pneumonia.  ECG shows no acute changes.  She will be given albuterol via inhaler and assess response.  Old records were reviewed and she has prior ED visits for COPD and for chest pain.  She still complaining of chest pain following acetaminophen and will be given dose of ibuprofen.  Initial troponin is normal.  Labs are unremarkable.  When reexamination, lungs sound improved, but patient states no improvement.  We will plan to give albuterol with ipratropium via nebulizer.  COVID testing is pending.  Case signed out to Dr. Rush Landmark.  Final Clinical Impression(s) / ED Diagnoses Final diagnoses:  Nonspecific chest pain  Productive cough  Bronchospasm    Rx / DC Orders ED Discharge Orders    None       Dione Booze, MD 10/21/20 [phone]

## 2020-12-06 ENCOUNTER — Telehealth: Payer: Self-pay

## 2020-12-06 NOTE — Telephone Encounter (Signed)
Pt called to be placed back on water pills due to leg swelling. Pt also ask to get 90 tablet enalapril instead of 30 tablets. Pt spoke with her insurance and was told that the 90 tablet of enalapril is cheaper. Since pt is on a fixed income, she wants the cheaper option to continue being able to afford all medications.

## 2020-12-07 NOTE — Telephone Encounter (Signed)
I need to see her for this--she was supposed to come in for fasting lab work and I see she ended up in the ED and did not follow up.  Please have her come in fasting for labs (see orders from her last visit in May) as well as the bp follow up she was to have and I will take a look at her legs then.

## 2020-12-08 NOTE — Telephone Encounter (Signed)
Pt expressed that she does not feel that she needs to make a follow up to get back on water pills because her legs have been swollen since she got off the medication. Plans to call back later to schedule labs. Would still like to get 90 tab enalapril because she expressed that she need the cheaper option

## 2020-12-09 MED ORDER — ENALAPRIL-HYDROCHLOROTHIAZIDE 10-25 MG PO TABS: 1.0000 | ORAL_TABLET | Freq: Every day | ORAL | 0 refills | Status: DC

## 2020-12-09 NOTE — Telephone Encounter (Signed)
Pt did not answer phone call. Voicemail left to call back

## 2020-12-12 NOTE — Telephone Encounter (Signed)
Pt did not answer phone call. Voicemail left to call back 

## 2020-12-13 NOTE — Telephone Encounter (Signed)
Pt notified. Patient will call back to schedule BMP and BP

## 2020-12-15 ENCOUNTER — Ambulatory Visit: Payer: Medicare HMO

## 2020-12-18 ENCOUNTER — Encounter (HOSPITAL_COMMUNITY): Payer: Self-pay | Admitting: Emergency Medicine

## 2020-12-18 ENCOUNTER — Emergency Department (HOSPITAL_COMMUNITY)
Admission: EM | Admit: 2020-12-18 | Discharge: 2020-12-18 | Disposition: A | Discharge status: Home / Self Care | Payer: Medicare HMO | Attending: Emergency Medicine | Admitting: Emergency Medicine

## 2020-12-18 ENCOUNTER — Emergency Department (HOSPITAL_COMMUNITY): Payer: Medicare HMO

## 2020-12-18 DIAGNOSIS — R531 Weakness: Secondary | ICD-10-CM | POA: Diagnosis not present

## 2020-12-18 DIAGNOSIS — R634 Abnormal weight loss: Secondary | ICD-10-CM | POA: Insufficient documentation

## 2020-12-18 DIAGNOSIS — R292 Abnormal reflex: Secondary | ICD-10-CM | POA: Insufficient documentation

## 2020-12-18 DIAGNOSIS — R059 Cough, unspecified: Secondary | ICD-10-CM | POA: Diagnosis not present

## 2020-12-18 DIAGNOSIS — R1010 Upper abdominal pain, unspecified: Secondary | ICD-10-CM | POA: Diagnosis not present

## 2020-12-18 DIAGNOSIS — Z5321 Procedure and treatment not carried out due to patient leaving prior to being seen by health care provider: Secondary | ICD-10-CM | POA: Diagnosis not present

## 2020-12-18 DIAGNOSIS — R079 Chest pain, unspecified: Secondary | ICD-10-CM | POA: Diagnosis not present

## 2020-12-18 LAB — COMPREHENSIVE METABOLIC PANEL
ALT: 13 U/L (ref 0–44)
AST: 19 U/L (ref 15–41)
Albumin: 4.1 g/dL (ref 3.5–5.0)
Alkaline Phosphatase: 56 U/L (ref 38–126)
Anion gap: 9 (ref 5–15)
BUN: 11 mg/dL (ref 8–23)
CO2: 33 mmol/L — ABNORMAL HIGH (ref 22–32)
Calcium: 9.9 mg/dL (ref 8.9–10.3)
Chloride: 98 mmol/L (ref 98–111)
Creatinine, Ser: 0.86 mg/dL (ref 0.44–1.00)
GFR, Estimated: >60 mL/min (ref >60)
Glucose, Bld: 118 mg/dL — ABNORMAL HIGH (ref 70–99)
Potassium: 3.6 mmol/L (ref 3.5–5.1)
Sodium: 140 mmol/L (ref 135–145)
Total Bilirubin: 0.9 mg/dL (ref 0.3–1.2)
Total Protein: 6.6 g/dL (ref 6.5–8.1)

## 2020-12-18 LAB — CBC
HCT: 45.9 % (ref 36.0–46.0)
Hemoglobin: 14.7 g/dL (ref 12.0–15.0)
MCH: 29.8 pg (ref 26.0–34.0)
MCHC: 32 g/dL (ref 30.0–36.0)
MCV: 93.1 fL (ref 80.0–100.0)
Platelets: 363 10*3/uL (ref 150–400)
RBC: 4.93 MIL/uL (ref 3.87–5.11)
RDW: 14.4 % (ref 11.5–15.5)
WBC: 4.9 10*3/uL (ref 4.0–10.5)
nRBC: 0 % (ref 0.0–0.2)

## 2020-12-18 LAB — LIPASE, BLOOD: Lipase: 25 U/L (ref 11–51)

## 2020-12-18 LAB — TROPONIN I (HIGH SENSITIVITY): Troponin I (High Sensitivity): 7 ng/L (ref <18)

## 2020-12-18 NOTE — ED Notes (Signed)
Called for patient, no response

## 2020-12-18 NOTE — ED Notes (Signed)
Called out again no response

## 2020-12-18 NOTE — ED Notes (Signed)
Called patient to move to room, unable to locate at this time. 

## 2020-12-18 NOTE — ED Triage Notes (Signed)
Pt reports generalized weakness, pain to center of chest, upper abd pain, gagging, non-productive cough, and weight loss x 2-3 months.

## 2020-12-19 ENCOUNTER — Other Ambulatory Visit: Payer: Self-pay

## 2020-12-19 ENCOUNTER — Emergency Department (HOSPITAL_COMMUNITY)
Admission: EM | Admit: 2020-12-19 | Discharge: 2020-12-19 | Disposition: A | Discharge status: Home / Self Care | Payer: Medicare HMO | Attending: Emergency Medicine | Admitting: Emergency Medicine

## 2020-12-19 ENCOUNTER — Encounter (HOSPITAL_COMMUNITY): Payer: Self-pay

## 2020-12-19 DIAGNOSIS — R531 Weakness: Secondary | ICD-10-CM | POA: Insufficient documentation

## 2020-12-19 DIAGNOSIS — M545 Low back pain, unspecified: Secondary | ICD-10-CM | POA: Insufficient documentation

## 2020-12-19 DIAGNOSIS — Z5321 Procedure and treatment not carried out due to patient leaving prior to being seen by health care provider: Secondary | ICD-10-CM | POA: Insufficient documentation

## 2020-12-19 DIAGNOSIS — R42 Dizziness and giddiness: Secondary | ICD-10-CM | POA: Diagnosis not present

## 2020-12-19 DIAGNOSIS — R55 Syncope and collapse: Secondary | ICD-10-CM | POA: Insufficient documentation

## 2020-12-19 DIAGNOSIS — R1084 Generalized abdominal pain: Secondary | ICD-10-CM | POA: Diagnosis not present

## 2020-12-19 NOTE — ED Notes (Signed)
Pt' came in with IV.  D/C upon pt's request.   RN aware.

## 2020-12-19 NOTE — ED Provider Notes (Signed)
Emergency Medicine Provider Triage Evaluation Note  [name] , a 65 y.o. female  was evaluated in triage.  Pt complains of syncope while at work. Abdominal pain that has been ongoing for about a month along with weight loss of 40 lbs in 1 month.   Review of Systems  Positive: Abdominal pain, syncope Negative: Fever, cough, chest pain  Physical Exam  BP 96/69 (BP Location: Left Arm)   Pulse 67   Temp 97.9 F (36.6 C) (Oral)   Resp 16   SpO2 99%  Gen:   Awake, no distress   Resp:  Normal effort  MSK:   Moves extremities without difficulty  Other:    Medical Decision Making  Medically screening exam initiated at 3:04 PM.  Appropriate orders placed.  Macel E Duffett was informed that the remainder of the evaluation will be completed by another provider, this initial triage assessment does not replace that evaluation, and the importance of remaining in the ED until their evaluation is complete.  Pressures are soft with EMS 90/60, received fluids, second blood pressure on arrival 96/69.  Will need a room, nursing staff notified.   Claude Manges, PA-C 12/19/20 1506    Derwood Kaplan, MD 12/19/20 1530

## 2020-12-19 NOTE — ED Notes (Signed)
Pt eloped, Pt did not inform staff she was leaving.

## 2020-12-19 NOTE — ED Triage Notes (Signed)
Pt BIB EMS from work. Pt c/o witnessed syncopal episode from coworker. Pt states she was dizzy, tried to walk around, had a LOC. Pt c/o dizziness, weakness, generalized abdominal pain, lower back pain. Abdominal pain and back pain have been going on for 1 month. Pt states she has lost 40 lbs in 1 month. Pt states she has seen her PCP for weight loss, pt states nothing has been done.  90/60 500 mL NS given by EMS CBG 116 70 HR 98% RA 2nd BP 118/78

## 2021-01-17 ENCOUNTER — Emergency Department (HOSPITAL_COMMUNITY)
Admission: EM | Admit: 2021-01-17 | Discharge: 2021-01-17 | Disposition: A | Discharge status: Home / Self Care | Payer: Medicare HMO | Attending: Emergency Medicine | Admitting: Emergency Medicine

## 2021-01-17 ENCOUNTER — Encounter (HOSPITAL_COMMUNITY): Payer: Self-pay | Admitting: Emergency Medicine

## 2021-01-17 ENCOUNTER — Emergency Department (HOSPITAL_COMMUNITY): Payer: Medicare HMO

## 2021-01-17 DIAGNOSIS — R0789 Other chest pain: Secondary | ICD-10-CM | POA: Diagnosis not present

## 2021-01-17 DIAGNOSIS — Z96642 Presence of left artificial hip joint: Secondary | ICD-10-CM | POA: Insufficient documentation

## 2021-01-17 DIAGNOSIS — Z79899 Other long term (current) drug therapy: Secondary | ICD-10-CM | POA: Diagnosis not present

## 2021-01-17 DIAGNOSIS — I10 Essential (primary) hypertension: Secondary | ICD-10-CM | POA: Diagnosis not present

## 2021-01-17 DIAGNOSIS — R0602 Shortness of breath: Secondary | ICD-10-CM | POA: Diagnosis not present

## 2021-01-17 DIAGNOSIS — J449 Chronic obstructive pulmonary disease, unspecified: Secondary | ICD-10-CM | POA: Insufficient documentation

## 2021-01-17 DIAGNOSIS — R1013 Epigastric pain: Secondary | ICD-10-CM | POA: Diagnosis not present

## 2021-01-17 DIAGNOSIS — R634 Abnormal weight loss: Secondary | ICD-10-CM | POA: Insufficient documentation

## 2021-01-17 DIAGNOSIS — J45909 Unspecified asthma, uncomplicated: Secondary | ICD-10-CM | POA: Insufficient documentation

## 2021-01-17 DIAGNOSIS — R110 Nausea: Secondary | ICD-10-CM | POA: Insufficient documentation

## 2021-01-17 DIAGNOSIS — F17210 Nicotine dependence, cigarettes, uncomplicated: Secondary | ICD-10-CM | POA: Insufficient documentation

## 2021-01-17 LAB — TROPONIN I (HIGH SENSITIVITY)
Troponin I (High Sensitivity): 6 ng/L (ref <18)
Troponin I (High Sensitivity): 6 ng/L (ref <18)

## 2021-01-17 LAB — CBC
HCT: 42.2 % (ref 36.0–46.0)
Hemoglobin: 13.6 g/dL (ref 12.0–15.0)
MCH: 30 pg (ref 26.0–34.0)
MCHC: 32.2 g/dL (ref 30.0–36.0)
MCV: 93 fL (ref 80.0–100.0)
Platelets: 310 10*3/uL (ref 150–400)
RBC: 4.54 MIL/uL (ref 3.87–5.11)
RDW: 13.9 % (ref 11.5–15.5)
WBC: 5.3 10*3/uL (ref 4.0–10.5)
nRBC: 0 % (ref 0.0–0.2)

## 2021-01-17 LAB — BASIC METABOLIC PANEL
Anion gap: 6 (ref 5–15)
BUN: 10 mg/dL (ref 8–23)
CO2: 32 mmol/L (ref 22–32)
Calcium: 9.5 mg/dL (ref 8.9–10.3)
Chloride: 102 mmol/L (ref 98–111)
Creatinine, Ser: 0.79 mg/dL (ref 0.44–1.00)
GFR, Estimated: >60 mL/min (ref >60)
Glucose, Bld: 93 mg/dL (ref 70–99)
Potassium: 3.1 mmol/L — ABNORMAL LOW (ref 3.5–5.1)
Sodium: 140 mmol/L (ref 135–145)

## 2021-01-17 LAB — LIPASE, BLOOD: Lipase: 22 U/L (ref 11–51)

## 2021-01-17 LAB — HEPATIC FUNCTION PANEL
ALT: 8 U/L (ref 0–44)
AST: 19 U/L (ref 15–41)
Albumin: 3.5 g/dL (ref 3.5–5.0)
Alkaline Phosphatase: 44 U/L (ref 38–126)
Bilirubin, Direct: 0.3 mg/dL — ABNORMAL HIGH (ref 0.0–0.2)
Indirect Bilirubin: 0.5 mg/dL (ref 0.3–0.9)
Total Bilirubin: 0.8 mg/dL (ref 0.3–1.2)
Total Protein: 5.6 g/dL — ABNORMAL LOW (ref 6.5–8.1)

## 2021-01-17 MED ORDER — MORPHINE SULFATE (PF) 4 MG/ML IV SOLN
4.0000 mg | Freq: Once | INTRAVENOUS | Status: AC
  Administered 2021-01-17: 4 mg via INTRAVENOUS
  Filled 2021-01-17: qty 1

## 2021-01-17 MED ORDER — IOHEXOL 300 MG/ML  SOLN
95.0000 mL | Freq: Once | INTRAMUSCULAR | Status: AC | PRN
  Administered 2021-01-17: 95 mL via INTRAVENOUS

## 2021-01-17 MED ORDER — ALUM & MAG HYDROXIDE-SIMETH 200-200-20 MG/5ML PO SUSP
30.0000 mL | Freq: Once | ORAL | Status: AC
  Administered 2021-01-17: 30 mL via ORAL
  Filled 2021-01-17: qty 30

## 2021-01-17 MED ORDER — OMEPRAZOLE 20 MG PO CPDR: 20.0000 mg | DELAYED_RELEASE_CAPSULE | Freq: Every day | ORAL | 0 refills | Status: DC

## 2021-01-17 MED ORDER — LIDOCAINE VISCOUS HCL 2 % MT SOLN
15.0000 mL | Freq: Once | OROMUCOSAL | Status: AC
  Administered 2021-01-17: 15 mL via ORAL
  Filled 2021-01-17: qty 15

## 2021-01-17 NOTE — ED Notes (Signed)
Patient c/o being unable to eat for 6 months with weight loss. Intermittent chest pain also

## 2021-01-17 NOTE — ED Provider Notes (Signed)
[facility] EMERGENCY DEPARTMENT Provider Note   CSN: [id number] Arrival date & time: 01/17/21  8563     History No chief complaint on file.   Regina KANAAN is a 65 y.o. female who presents to the ED today with complaint of gradual onset, constant, sharp/stabbing, epigastric abdominal pain for the past 5 months. Pt reports that every time she eats or drinks she will have worsening pain. She reports that due to this she does not want to eat very much and  has subsequently lost 40 pounds in the past 5-6 months. She complains of nausea however denies vomiting. She also complains of some chest tightness/shortness of breath but is unsure if this is related to the abdominal pain she is having. She does report current everyday smoking however states she has significantly cut back and smokes about 3 cigarettes per day - she has been smoking since her mid 20's however. Pt reports regular Bms. She last had a BM yesterday without issue. She last had a colonoscopy about 10-11 years ago and believes it was normal. She does admit to significant family history of cancer.   The history is provided by the patient and medical records.      Past Medical History:  Diagnosis Date   Arthritis    Asthma    Chest pain 2006   Chest pain 10/2016   Chronic back pain    Hypertension    Sciatica    Shortness of breath    due to  smoking     Patient Active Problem List   Diagnosis Date Noted   Pain of left upper extremity 12/28/2016   Elevated TSH 11/08/2016   Gastroesophageal reflux disease 11/08/2016   Constipation 11/08/2016   COPD (chronic obstructive pulmonary disease) (HCC) 11/08/2016   Tobacco use 11/08/2016   Atypical chest pain 11/07/2016   Unstable angina (HCC) 01/07/2016   HTN (hypertension) 01/07/2016   Hypokalemia 11/06/2013   Acute blood loss anemia 11/06/2013   Osteoarthritis of left hip 11/05/2013   History of total left hip replacement 11/05/2013   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 05/14/2012    Past Surgical History:  Procedure Laterality Date   ABDOMINAL HYSTERECTOMY  1997   CARDIAC CATHETERIZATION  2006   HEMI-MICRODISCECTOMY LUMBAR LAMINECTOMY LEVEL 1  05/14/2012   Procedure: HEMI-MICRODISCECTOMY LUMBAR LAMINECTOMY LEVEL 1;  Surgeon: Jacki Cones, MD;  Location: [facility];  Service: Orthopedics;  Laterality: N/A;  Hemi Laminectomy Microdiscectomy L4 - L5 Central (X-Ray)   ingrown toenail removal     TONSILLECTOMY     TOTAL HIP ARTHROPLASTY Left 11/05/2013   Procedure: LEFT TOTAL HIP ARTHROPLASTY;  Surgeon: Jacki Cones, MD;  Location: [facility];  Service: Orthopedics;  Laterality: Left;     OB History     Gravida  3   Para  3   Term      Preterm      AB      Living         SAB      IAB      Ectopic      Multiple      Live Births              Family History  Problem Relation Age of Onset   Heart attack Mother 33   Hypertension Sister    Diabetes type II Sister    Tongue cancer Brother    Heart attack Sister 67  Lung cancer Sister    Hypertension Sister     Social History   Tobacco Use   Smoking status: Every Day    Packs/day: 0.10    Years: 30.00    Pack years: 3.00    Types: Cigarettes   Smokeless tobacco: Never   Tobacco comments:    Smoking since teenage years  Vaping Use   Vaping Use: Never used  Substance Use Topics   Alcohol use: No   Drug use: No    Home Medications Prior to Admission medications   Medication Sig Start Date End Date Taking? Authorizing Provider  albuterol (PROVENTIL) (2.5 MG/3ML) 0.083% nebulizer solution Take 3 mLs (2.5 mg total) by nebulization every 6 (six) hours as needed for wheezing or shortness of breath. 06/20/20  Yes Mickie Bail, NP  albuterol (VENTOLIN HFA) 108 (90 Base) MCG/ACT inhaler Inhale 2 puffs into the lungs every 4 (four) hours as needed for wheezing or shortness of breath. 06/20/20  Yes Mickie Bail, NP   enalapril-hydrochlorothiazide (VASERETIC) 10-25 MG tablet Take 1 tablet by mouth daily. 12/09/20  Yes Julieanne Manson, MD  famotidine (PEPCID) 20 MG tablet Take 20 mg by mouth daily as needed for heartburn or indigestion. 01/06/21  Yes [provider]  Multiple Vitamin (MULTIVITAMIN) capsule Take 1 capsule by mouth daily.   Yes [provider]  omeprazole (PRILOSEC) 20 MG capsule Take 1 capsule (20 mg total) by mouth daily. 01/17/21 02/16/21 Yes Dvante Hands, PA-C  oxyCODONE-acetaminophen (PERCOCET) 10-325 MG tablet Take 1 tablet by mouth every 4 (four) hours as needed for pain. 12/23/20  Yes [provider]  zolpidem (AMBIEN) 5 MG tablet Take 5 mg by mouth at bedtime. 12/25/20  Yes [provider]  predniSONE (DELTASONE) 10 MG tablet 6.5.4.3.2.1 taper Patient not taking: No sig reported 10/04/20   Elson Areas, PA-C    Allergies    Patient has no known allergies.  Review of Systems   Review of Systems  Constitutional:  Positive for fatigue and unexpected weight change. Negative for chills and fever.  Respiratory:  Positive for shortness of breath. Negative for cough.   Cardiovascular:  Positive for chest pain.  Gastrointestinal:  Positive for abdominal pain and nausea. Negative for constipation and vomiting.  All other systems reviewed and are negative.  Physical Exam Updated Vital Signs BP (!) 166/80   Pulse (!) 52   Temp 98.3 F (36.8 C) (Oral)   Resp 16   Ht 5' 0.5" (1.537 m)   Wt 43.3 kg   SpO2 99%   BMI 18.34 kg/m   Physical Exam Vitals and nursing note reviewed.  Constitutional:      Appearance: She is not ill-appearing or diaphoretic.  HENT:     Head: Normocephalic and atraumatic.  Eyes:     Conjunctiva/sclera: Conjunctivae normal.  Cardiovascular:     Rate and Rhythm: Normal rate and regular rhythm.     Pulses: Normal pulses.  Pulmonary:     Effort: Pulmonary effort is normal.     Breath sounds: Normal breath sounds. No  wheezing, rhonchi or rales.  Abdominal:     Palpations: Abdomen is soft.     Tenderness: There is abdominal tenderness. There is no guarding or rebound.     Comments: Soft, + epigastric abdominal TTP, +BS throughout, no r/g/r, neg murphy's, neg mcburney's, no CVA TTP  Musculoskeletal:     Cervical back: Neck supple.  Skin:    General: Skin is warm and dry.  Neurological:     Mental Status: She is alert.    ED Results / Procedures / Treatments   Labs (all labs ordered are listed, but only abnormal results are displayed) Labs Reviewed  BASIC METABOLIC PANEL - Abnormal; Notable for the following components:      Result Value   Potassium 3.1 (*)    All other components within normal limits  HEPATIC FUNCTION PANEL - Abnormal; Notable for the following components:   Total Protein 5.6 (*)    Bilirubin, Direct 0.3 (*)    All other components within normal limits  CBC  LIPASE, BLOOD  TROPONIN I (HIGH SENSITIVITY)  TROPONIN I (HIGH SENSITIVITY)    EKG None  Radiology DG Chest 2 View  Result Date: 01/17/2021 CLINICAL DATA:  65 year old female with chest pain, abdominal pain. Postprandial pain with unintentional weight loss. EXAM: CHEST - 2 VIEW COMPARISON:  Chest radiograph 12/18/2020 and earlier. FINDINGS: Chronic pulmonary hyperinflation. Mediastinal contours are stable and within normal limits aside from some tortuosity of the thoracic aorta. Visualized tracheal air column is within normal limits. No pneumothorax, pulmonary edema, pleural effusion or confluent pulmonary opacity. No acute osseous abnormality identified. Negative visible bowel gas pattern. IMPRESSION: Chronic pulmonary hyperinflation. No acute cardiopulmonary abnormality. Electronically Signed   By: Odessa Fleming M.D.   On: 01/17/2021 09:19   CT Abdomen Pelvis W Contrast  Result Date: 01/17/2021 CLINICAL DATA:  Unintended weight loss and abdominal pain. EXAM: CT ABDOMEN AND PELVIS WITH CONTRAST TECHNIQUE: Multidetector CT  imaging of the abdomen and pelvis was performed using the standard protocol following bolus administration of intravenous contrast. CONTRAST:  104mL OMNIPAQUE IOHEXOL 300 MG/ML  SOLN COMPARISON:  Abdominopelvic CT 07/20/2020 FINDINGS: Lower chest: Breathing motion artifact at the lung bases. No focal airspace disease or pleural effusion. Hepatobiliary: Again seen focal fatty infiltration adjacent to the falciform ligament. Occasional tiny subcentimeter hypodensities are unchanged, likely small cysts or hemangiomas. No new or suspicious liver lesion. Gallbladder physiologically distended, no calcified stone. No biliary dilatation. Pancreas: No ductal dilatation or inflammation. No evidence of pancreatic mass. Spleen: Normal in size without focal abnormality. Adrenals/Urinary Tract: No adrenal nodule. No hydronephrosis. Early excretion of IV contrast within both renal collecting systems which limits assessment for stone. Tiny hypodense lesion in the upper right kidney is unchanged, likely small cyst, no suspicious renal lesion. Unremarkable urinary bladder. Stomach/Bowel: Bowel assessment is limited in the absence of enteric contrast and paucity of intra-abdominal fat. Stomach is minimally distended. No obvious gastric abnormality. Few fluid-filled loops of small bowel in the pelvis without obstruction or inflammation. Small bowel is otherwise decompressed. No terminal ileal inflammation. The appendix is not well seen on the current exam. There is no evidence of appendicitis or pericecal inflammation. Small to moderate colonic stool burden. Diverticulosis involving the descending and sigmoid colon. No diverticulitis. No obvious colonic mass, allowing for limitations related to lack of enteric contrast. Vascular/Lymphatic: Aorto and branch atherosclerosis. No aortic aneurysm. Patent portal vein. No abdominopelvic adenopathy. Reproductive: Hysterectomy.  No adnexal mass. Other: No ascites. No omental disease. No free air  or focal fluid collection. Musculoskeletal: Left hip arthroplasty. No focal bone lesion or acute osseous abnormalities. IMPRESSION: 1. No acute abnormality or explanation for abdominal pain and weight loss. 2. Colonic diverticulosis without diverticulitis. Aortic Atherosclerosis (ICD10-I70.0). Electronically Signed   By: Narda Rutherford M.D.   On: 01/17/2021 16:50    Procedures Procedures   Medications Ordered in ED Medications  alum & mag hydroxide-simeth (MAALOX/MYLANTA) 200-200-20 MG/5ML suspension  30 mL (has no administration in time range)    And  lidocaine (XYLOCAINE) 2 % viscous mouth solution 15 mL (has no administration in time range)  morphine 4 MG/ML injection 4 mg (4 mg Intravenous Given 01/17/21 1556)  iohexol (OMNIPAQUE) 300 MG/ML solution 95 mL (95 mLs Intravenous Contrast Given 01/17/21 1624)    ED Course  I have reviewed the triage vital signs and the nursing notes.  Pertinent labs & imaging results that were available during my care of the patient were reviewed by me and considered in my medical decision making (see chart for details).    MDM Rules/Calculators/A&P                           65 year old female presents to the ED today with complaint of epigastric abdominal pain for the past 5 to 6 months with 40 pound weight loss.  Also complains of some chest pain and shortness of breath as well.  On arrival to the ED vitals are stable.  Patient is afebrile, nontachycardic and nontachypneic and appears to be in no acute distress at this time.  She is noted to have epigastric tenderness palpation on exam.  She had some labs and imaging done while in the waiting room including chest x-ray which showed chronic pulmonary hyperinflation.  Troponin negative at 6.  BMP with potassium of 3.1, no other electrolyte abnormalities.  Given weight loss and abdominal pain will plan for CT A/P. Pt without tachycardia or hypoxia to suggest PE at this time.   LFTs unremarkable Lipase  22 Repeat troponin 6  CT: IMPRESSION:  1. No acute abnormality or explanation for abdominal pain and weight  loss.  2. Colonic diverticulosis without diverticulitis.     Aortic Atherosclerosis (ICD10-I70.0).   Will provide GI cocktail. Question PUD causing discomfort with eating? May require EGD in the outpatient setting. Will discharge with PPI and GI follow up. Pt in agreement with plan and stable for discharge home.   This note was prepared using Dragon voice recognition software and may include unintentional dictation errors due to the inherent limitations of voice recognition software.   Final Clinical Impression(s) / ED Diagnoses Final diagnoses:  Epigastric pain  Weight loss    Rx / DC Orders ED Discharge Orders          Ordered    omeprazole (PRILOSEC) 20 MG capsule  Daily        01/17/21 1835             Discharge Instructions      Please pick up medication and take as prescribed Follow up with [facility] regarding ED visit today. You will need to call them to schedule an appointment Please also follow up with your PCP  Return to the ED for any new/worsening symptoms       Tanda RockersVenter, Aditi Rovira, PA-C 01/17/21 1836    Rolan BuccoBelfi, Melanie, MD 01/28/21 [phone]

## 2021-01-17 NOTE — Discharge Instructions (Addendum)
Please pick up medication and take as prescribed Follow up with [facility] regarding ED visit today. You will need to call them to schedule an appointment Please also follow up with your PCP  Return to the ED for any new/worsening symptoms

## 2021-01-17 NOTE — ED Triage Notes (Signed)
Pt here from home with c/o cp and and pain , and wt loss , pt was [facility]  in July for same but [facility] ,

## 2021-03-08 ENCOUNTER — Other Ambulatory Visit: Payer: Self-pay | Admitting: Internal Medicine

## 2021-03-10 NOTE — Telephone Encounter (Signed)
Needs bp check and BMP for any more refills

## 2021-03-25 LAB — HM COLONOSCOPY

## 2021-03-31 NOTE — Telephone Encounter (Signed)
Voicemail left to call back 

## 2021-05-03 NOTE — Telephone Encounter (Signed)
Voicemail left to call back 

## 2021-06-07 ENCOUNTER — Other Ambulatory Visit: Payer: Self-pay | Admitting: Internal Medicine

## 2021-06-10 ENCOUNTER — Encounter (HOSPITAL_COMMUNITY): Payer: Self-pay | Admitting: Emergency Medicine

## 2021-06-10 ENCOUNTER — Telehealth (HOSPITAL_COMMUNITY): Payer: Self-pay | Admitting: Emergency Medicine

## 2021-06-10 ENCOUNTER — Emergency Department (HOSPITAL_COMMUNITY)
Admission: EM | Admit: 2021-06-10 | Discharge: 2021-06-10 | Disposition: A | Discharge status: Home / Self Care | Payer: Medicare HMO | Attending: Emergency Medicine | Admitting: Emergency Medicine

## 2021-06-10 ENCOUNTER — Emergency Department (HOSPITAL_COMMUNITY): Payer: Medicare HMO

## 2021-06-10 DIAGNOSIS — J449 Chronic obstructive pulmonary disease, unspecified: Secondary | ICD-10-CM | POA: Insufficient documentation

## 2021-06-10 DIAGNOSIS — I10 Essential (primary) hypertension: Secondary | ICD-10-CM | POA: Insufficient documentation

## 2021-06-10 DIAGNOSIS — J45901 Unspecified asthma with (acute) exacerbation: Secondary | ICD-10-CM | POA: Diagnosis not present

## 2021-06-10 DIAGNOSIS — R0602 Shortness of breath: Secondary | ICD-10-CM

## 2021-06-10 DIAGNOSIS — F17210 Nicotine dependence, cigarettes, uncomplicated: Secondary | ICD-10-CM | POA: Insufficient documentation

## 2021-06-10 DIAGNOSIS — Z20822 Contact with and (suspected) exposure to covid-19: Secondary | ICD-10-CM | POA: Insufficient documentation

## 2021-06-10 DIAGNOSIS — Z79899 Other long term (current) drug therapy: Secondary | ICD-10-CM | POA: Insufficient documentation

## 2021-06-10 LAB — CBC WITH DIFFERENTIAL/PLATELET
Abs Immature Granulocytes: 0.02 10*3/uL (ref 0.00–0.07)
Basophils Absolute: 0 10*3/uL (ref 0.0–0.1)
Basophils Relative: 0 %
Eosinophils Absolute: 0 10*3/uL (ref 0.0–0.5)
Eosinophils Relative: 0 %
HCT: 40.2 % (ref 36.0–46.0)
Hemoglobin: 12.5 g/dL (ref 12.0–15.0)
Immature Granulocytes: 0 %
Lymphocytes Relative: 35 %
Lymphs Abs: 2.7 10*3/uL (ref 0.7–4.0)
MCH: 29.8 pg (ref 26.0–34.0)
MCHC: 31.1 g/dL (ref 30.0–36.0)
MCV: 95.7 fL (ref 80.0–100.0)
Monocytes Absolute: 0.5 10*3/uL (ref 0.1–1.0)
Monocytes Relative: 7 %
Neutro Abs: 4.4 10*3/uL (ref 1.7–7.7)
Neutrophils Relative %: 58 %
Platelets: 304 10*3/uL (ref 150–400)
RBC: 4.2 MIL/uL (ref 3.87–5.11)
RDW: 13.9 % (ref 11.5–15.5)
WBC: 7.6 10*3/uL (ref 4.0–10.5)
nRBC: 0 % (ref 0.0–0.2)

## 2021-06-10 LAB — BASIC METABOLIC PANEL
Anion gap: 8 (ref 5–15)
BUN: 15 mg/dL (ref 8–23)
CO2: 30 mmol/L (ref 22–32)
Calcium: 9.2 mg/dL (ref 8.9–10.3)
Chloride: 101 mmol/L (ref 98–111)
Creatinine, Ser: 0.57 mg/dL (ref 0.44–1.00)
GFR, Estimated: >60 mL/min (ref >60)
Glucose, Bld: 111 mg/dL — ABNORMAL HIGH (ref 70–99)
Potassium: 3.2 mmol/L — ABNORMAL LOW (ref 3.5–5.1)
Sodium: 139 mmol/L (ref 135–145)

## 2021-06-10 LAB — RESP PANEL BY RT-PCR (FLU A&B, COVID) ARPGX2
Influenza A by PCR: NEGATIVE
Influenza B by PCR: NEGATIVE
SARS Coronavirus 2 by RT PCR: NEGATIVE

## 2021-06-10 MED ORDER — ALBUTEROL SULFATE (2.5 MG/3ML) 0.083% IN NEBU: 2.5000 mg | INHALATION_SOLUTION | Freq: Four times a day (QID) | RESPIRATORY_TRACT | 12 refills | Status: DC | PRN

## 2021-06-10 MED ORDER — METHYLPREDNISOLONE SODIUM SUCC 125 MG IJ SOLR
125.0000 mg | Freq: Once | INTRAMUSCULAR | Status: AC
Start: 2021-06-10 — End: 2021-06-10
  Administered 2021-06-10: 125 mg via INTRAVENOUS
  Filled 2021-06-10: qty 2

## 2021-06-10 MED ORDER — PREDNISONE 20 MG PO TABS: 40.0000 mg | ORAL_TABLET | Freq: Every day | ORAL | 0 refills | Status: AC

## 2021-06-10 MED ORDER — SODIUM CHLORIDE 0.9 % IV BOLUS
500.0000 mL | Freq: Once | INTRAVENOUS | Status: AC
  Administered 2021-06-10: 500 mL via INTRAVENOUS

## 2021-06-10 MED ORDER — ALBUTEROL SULFATE HFA 108 (90 BASE) MCG/ACT IN AERS: 2.0000 | INHALATION_SPRAY | RESPIRATORY_TRACT | 0 refills | Status: DC | PRN

## 2021-06-10 MED ORDER — IPRATROPIUM-ALBUTEROL 0.5-2.5 (3) MG/3ML IN SOLN
3.0000 mL | Freq: Once | RESPIRATORY_TRACT | Status: AC
Start: 2021-06-10 — End: 2021-06-10
  Administered 2021-06-10: 3 mL via RESPIRATORY_TRACT
  Filled 2021-06-10: qty 3

## 2021-06-10 MED ORDER — IPRATROPIUM-ALBUTEROL 0.5-2.5 (3) MG/3ML IN SOLN
3.0000 mL | Freq: Once | RESPIRATORY_TRACT | Status: AC
  Administered 2021-06-10: 3 mL via RESPIRATORY_TRACT
  Filled 2021-06-10: qty 3

## 2021-06-10 NOTE — Telephone Encounter (Signed)
Patient called the emergency department stating that she was having trouble with her albuterol nebulizer solution.  Previous prescription was canceled and new prescription was placed.

## 2021-06-10 NOTE — ED Triage Notes (Signed)
Per EMS, patient from home, states SOB since waking up to apartment smelling like marijuana this morning. Attempted inhaler with little relief. Breathing treatment with EMS. Hx COPD and asthma. 98% on RA now. Denies pain.

## 2021-06-10 NOTE — ED Provider Notes (Signed)
[facility]-EMERGENCY DEPT Provider Note   CSN: [id number] Arrival date & time: 06/10/21  1003     History  Chief Complaint  Patient presents with   Shortness of Breath    [name] is a 66 y.o. female with a past medical history of COPD, asthma, hypertension, GERD.  Presents to the emergency department with a chief complaint of shortness of breath and wheezing.  Patient states that she woke up at 0 300 this morning and smelled a strong odor of marijuana in her apartment from her neighbors.  Patient states that this triggered her to start having shortness of breath and wheezing.  Patient states that she took her albuterol inhaler with minimal improvement in her shortness of breath and wheezing.  Patient was able to fall back asleep after this incident.  Patient woke at 0 800 with continued shortness of breath.  Patient again tried her albuterol inhaler with no relief of symptoms.  Patient called 911 and came to the emergency department for further evaluation.  Patient received breathing treatment with EMS.  Patient states that nebulizer treatment did help with her symptoms.    Patient states that she has developed a cough over the last few days.  Cough is nonproductive.  Patient states that she does not usually have a cough with her COPD.  Patient also endorses rhinorrhea.  Patient denied any chest pain, lightheadedness, dizziness, leg swelling or tenderness, hemoptysis, fever, chills, nasal congestion.  Patient endorses smoking 5 cigarettes daily.  Patient states that she has been trying to cut back since the start of the new year.   Shortness of Breath Associated symptoms: cough and wheezing   Associated symptoms: no abdominal pain, no chest pain, no fever, no headaches, no neck pain, no rash, no sore throat and no vomiting       Home Medications Prior to Admission medications   Medication Sig Start Date End Date Taking? Authorizing Provider  albuterol  (PROVENTIL) (2.5 MG/3ML) 0.083% nebulizer solution Take 3 mLs (2.5 mg total) by nebulization every 6 (six) hours as needed for wheezing or shortness of breath. 06/20/20   Mickie Bail, NP  albuterol (VENTOLIN HFA) 108 (90 Base) MCG/ACT inhaler Inhale 2 puffs into the lungs every 4 (four) hours as needed for wheezing or shortness of breath. 06/20/20   Mickie Bail, NP  enalapril-hydrochlorothiazide (VASERETIC) 10-25 MG tablet TAKE 1 TABLET BY MOUTH DAILY 06/08/21   Julieanne Manson, MD  famotidine (PEPCID) 20 MG tablet Take 20 mg by mouth daily as needed for heartburn or indigestion. 01/06/21   [provider]  Multiple Vitamin (MULTIVITAMIN) capsule Take 1 capsule by mouth daily.    [provider]  omeprazole (PRILOSEC) 20 MG capsule Take 1 capsule (20 mg total) by mouth daily. 01/17/21 02/16/21  Tanda Rockers, PA-C  oxyCODONE-acetaminophen (PERCOCET) 10-325 MG tablet Take 1 tablet by mouth every 4 (four) hours as needed for pain. 12/23/20   [provider]  predniSONE (DELTASONE) 10 MG tablet [id number] taper Patient not taking: No sig reported 10/04/20   Elson Areas, PA-C  zolpidem (AMBIEN) 5 MG tablet Take 5 mg by mouth at bedtime. 12/25/20   [provider]      Allergies    Patient has no known allergies.    Review of Systems   Review of Systems  Constitutional:  Negative for chills and fever.  HENT:  Positive for rhinorrhea. Negative for congestion and sore throat.   Eyes:  Negative  for visual disturbance.  Respiratory:  Positive for cough, shortness of breath and wheezing.   Cardiovascular:  Negative for chest pain, palpitations and leg swelling.  Gastrointestinal:  Negative for abdominal pain, nausea and vomiting.  Musculoskeletal:  Negative for back pain and neck pain.  Skin:  Negative for color change and rash.  Neurological:  Negative for dizziness, syncope, light-headedness and headaches.  Psychiatric/Behavioral:  Negative for confusion.     Physical Exam Updated Vital Signs BP 103/68 (BP Location: Left Arm)    Pulse 66    Temp 98.9 F (37.2 C) (Oral)    Resp (!) 24    SpO2 100%  Physical Exam Vitals and nursing note reviewed.  Constitutional:      General: She is not in acute distress.    Appearance: She is not ill-appearing, toxic-appearing or diaphoretic.  HENT:     Head: Normocephalic.  Eyes:     General: No scleral icterus.       Right eye: No discharge.        Left eye: No discharge.  Cardiovascular:     Rate and Rhythm: Normal rate.     Pulses:          Radial pulses are 2+ on the right side and 2+ on the left side.  Pulmonary:     Effort: Pulmonary effort is normal. No tachypnea, bradypnea or respiratory distress.     Breath sounds: No stridor. Decreased breath sounds and wheezing present. No rhonchi or rales.     Comments: Expiratory wheezing noted to all lung fields Abdominal:     General: Abdomen is flat.     Palpations: Abdomen is soft. There is no mass or pulsatile mass.     Tenderness: There is no abdominal tenderness. There is no guarding or rebound.  Musculoskeletal:     Right lower leg: Normal.     Left lower leg: Normal.  Skin:    General: Skin is warm and dry.  Neurological:     General: No focal deficit present.     Mental Status: She is alert.     GCS: GCS eye subscore is 4. GCS verbal subscore is 5. GCS motor subscore is 6.  Psychiatric:        Behavior: Behavior is cooperative.    ED Results / Procedures / Treatments   Labs (all labs ordered are listed, but only abnormal results are displayed) Labs Reviewed  BASIC METABOLIC PANEL - Abnormal; Notable for the following components:      Result Value   Potassium 3.2 (*)    Glucose, Bld 111 (*)    All other components within normal limits  RESP PANEL BY RT-PCR (FLU A&B, COVID) ARPGX2  CBC WITH DIFFERENTIAL/PLATELET    EKG None  Radiology DG Chest Port 1 View  Result Date: 06/10/2021 CLINICAL DATA:  Per ER note: Pt complains  of shortness of breath that has been progressively worsening over the past week. She reports that significantly worsened around 2 AM this morning. She denies associated chest pain, lightheadedness, palpitations, abdominal pain. She reports she does have nebulizer machine and albuterol inhaler at home but she has not used either in the past week until EMS arrived on scene. States she does not wear oxygen at baseline. Currently on room air and satting low 90s.Hx of asthma, HTN, smoker EXAM: PORTABLE CHEST 1 VIEW COMPARISON:  01/17/2021 and older studies. FINDINGS: Cardiac silhouette is normal in size. No mediastinal or hilar masses. Lungs are hyperexpanded, but clear.  No convincing pleural effusion. No pneumothorax. Skeletal structures are grossly intact. IMPRESSION: No active disease. Electronically Signed   By: Amie Portlandavid  Ormond M.D.   On: 06/10/2021 14:14     Procedures Procedures    Medications Ordered in ED Medications  ipratropium-albuterol (DUONEB) 0.5-2.5 (3) MG/3ML nebulizer solution 3 mL (3 mLs Nebulization Given 06/10/21 1110)    ED Course/ Medical Decision Making/ A&P                           Medical Decision Making  This patient presents to the ED for concern of shortness of breath and wheezing, this involves an extensive number of treatment options, and is a complaint that carries with it a high risk of complications and morbidity.  The differential diagnosis includes but is not limited to asthma exacerbation, COPD exacerbation, influenza, COVID-19, acute CHF.   Co morbidities that complicate the patient evaluation  COPD, hypertension   Additional history obtained:  External records from outside source obtained and reviewed including lab results and previous chest x-rays   Lab Tests:  I Ordered, and personally interpreted labs.  The pertinent results include:   CBC is unremarkable Potassium 3.2 Negative COVID-19 and influenza   Imaging Studies ordered:  I ordered  imaging studies including chest x-ray I independently visualized and interpreted imaging which showed no active cardiopulmonary disease I agree with the radiologist interpretation   Cardiac Monitoring:  The patient was maintained on a cardiac monitor.  I personally viewed and interpreted the cardiac monitored which showed an underlying rhythm of: This rhythm   Medicines ordered and prescription drug management:  I ordered medication including Solu-Medrol and DuoNeb for wheezing Reevaluation of the patient after these medicines showed that the patient improved I have reviewed the patients home medicines and have made adjustments as needed     Problem List / ED Course:  Shortness of breath and wheezing Patient has history of COPD and asthma.  Is not on any daily inhaled corticosteroid.  Uses rescue inhaler as needed for shortness of breath. Upon assessment patient has decreased breath sounds and expiratory wheezing noted to all lung fields.  Will place order for Solu-Medrol and DuoNeb at this time. Patient has improvement in breathing after receiving DuoNeb Solu-Medrol.  Patient reports improvement in her shortness of breath.  Patient is noted to have minimal expiratory wheezing to all lung fields.  Suspect that this is due to her COPD and a chronic issue. Patient was noted to have hypokalemia at 3.2.  Suspect that this is due to receiving albuterol with EMS in route to the hospital.  We will have patient follow-up with PCP in outpatient setting to have this rechecked. Will discharge patient with short course of steroids, refill of albuterol inhaler, refill of albuterol nebulizer solution.  Patient was discussed with and evaluated by Dr. Stevie Kernykstra.   Reevaluation:  After the interventions noted above, I reevaluated the patient and found that they have :improved   Disposition:  After consideration of the diagnostic results and the patients response to treatment, I feel that the  patent would benefit from discharge and follow-up with PCP.          Final Clinical Impression(s) / ED Diagnoses Final diagnoses:  Exacerbation of asthma, unspecified asthma severity, unspecified whether persistent    Rx / DC Orders ED Discharge Orders          Ordered    albuterol (PROVENTIL) (2.5 MG/3ML) 0.083% nebulizer solution  Every 6 hours PRN        06/10/21 1328    albuterol (VENTOLIN HFA) 108 (90 Base) MCG/ACT inhaler  Every 4 hours PRN        06/10/21 1328    predniSONE (DELTASONE) 20 MG tablet  Daily        06/10/21 1328              Haskel SchroederBadalamente, Deyanna Mctier R, [place] JerseyPA-C 06/10/21 1452    Milagros Lollykstra, Richard S, MD 06/11/21 [id number]

## 2021-06-10 NOTE — ED Provider Triage Note (Signed)
Emergency Medicine Provider Triage Evaluation Note  YVAINE JANKOWIAK , a 66 y.o. female  was evaluated in triage.  Pt complains of shortness of breath that has been progressively worsening over the past week.  She reports that significantly worsened around 2 AM this morning.  She denies associated chest pain, lightheadedness, palpitations, abdominal pain.  She reports she does have nebulizer machine and albuterol inhaler at home but she has not used either in the past week until EMS arrived on scene.  States she does not wear oxygen at baseline.  Currently on room air and satting low 90s.  Review of Systems  Positive: As above Negative: As above  Physical Exam  BP 103/68 (BP Location: Left Arm)    Pulse 66    Temp 98.9 F (37.2 C) (Oral)    Resp (!) 24    SpO2 100%  Gen:   Awake, no distress   Resp:  Without accessory muscle use.  Faint wheezes and diminished lung sounds throughout. MSK:   Moves extremities without difficulty  Other:    Medical Decision Making  Medically screening exam initiated at 10:39 AM.  Appropriate orders placed.  Yeilyn E Pickerill was informed that the remainder of the evaluation will be completed by another provider, this initial triage assessment does not replace that evaluation, and the importance of remaining in the ED until their evaluation is complete.     Marita Kansas, PA-C 06/10/21 1040

## 2021-06-10 NOTE — Discharge Instructions (Addendum)
You came to the emergency department today to be evaluated for your shortness of breath and wheezing.  Your symptoms improved after receiving steroids and nebulizer treatment in the emergency department.  Your lab work showed that your potassium was slightly decreased, please follow-up with your primary care provider in the outpatient setting to have this rechecked.    Get help right away if: You are getting worse and do not respond to treatment during an asthma attack. You are short of breath when at rest or when doing very little physical activity. You have difficulty eating, drinking, or talking. You have chest pain or tightness. You develop a fast heartbeat or palpitations. You have a bluish color to your lips or fingernails. You are light-headed or dizzy, or you faint. Your peak flow reading is less than 50% of your personal best. You feel too tired to breathe normally.

## 2021-06-14 ENCOUNTER — Telehealth: Payer: Self-pay

## 2021-06-14 NOTE — Telephone Encounter (Signed)
Patient called asking for an appointment after experiencing chest congestion, cough and runny nose for about a week. Has used vicks, cough drops and cough syrup but it has not helped. Had an ED visit 06/10/2021 for asthma/COPD, but did not mention to provider that she had a cough. Was negative for flu and covid during ED visit.

## 2021-06-16 ENCOUNTER — Other Ambulatory Visit: Payer: Self-pay

## 2021-06-16 ENCOUNTER — Ambulatory Visit: Payer: Medicare HMO | Admitting: Internal Medicine

## 2021-06-16 ENCOUNTER — Encounter: Payer: Self-pay | Admitting: Internal Medicine

## 2021-06-16 VITALS — BP 146/86 | HR 80 | Resp 16 | Ht 60.6 in | Wt 97.0 lb

## 2021-06-16 DIAGNOSIS — E876 Hypokalemia: Secondary | ICD-10-CM

## 2021-06-16 DIAGNOSIS — K219 Gastro-esophageal reflux disease without esophagitis: Secondary | ICD-10-CM

## 2021-06-16 DIAGNOSIS — I10 Essential (primary) hypertension: Secondary | ICD-10-CM | POA: Diagnosis not present

## 2021-06-16 DIAGNOSIS — J439 Emphysema, unspecified: Secondary | ICD-10-CM

## 2021-06-16 DIAGNOSIS — R634 Abnormal weight loss: Secondary | ICD-10-CM | POA: Diagnosis not present

## 2021-06-16 DIAGNOSIS — R7989 Other specified abnormal findings of blood chemistry: Secondary | ICD-10-CM

## 2021-06-16 MED ORDER — OMEPRAZOLE 40 MG PO CPDR: DELAYED_RELEASE_CAPSULE | ORAL | 11 refills | Status: DC

## 2021-06-16 MED ORDER — FLUTICASONE-SALMETEROL 250-50 MCG/ACT IN AEPB: INHALATION_SPRAY | RESPIRATORY_TRACT | 11 refills | Status: DC

## 2021-06-16 NOTE — Progress Notes (Signed)
Subjective:    Patient ID: [name], female   DOB: February 14, 1956, 66 y.o.   MRN: [id number]   HPI   Weight loss:  Weight 145 lbs in March of 2018.  Was lost to follow up until 09/2020, where she weighed 108 lbs.  Today, weighs 97 lbs.   Lost 37 lbs over the time period seen here between 07/2016 and 09/2020.  Admits to decreased appetite over that period of time.   Previously, would drink 3 sodas daily.  3 meals with starch, protein/fat, veggie or fruit.  Coffee with cream and sugar in the morning.  Also would drink water.   Started noting her weight loss in July of 2021.  She started just smoking a cigarette and drinking coffee first thing in morning and no breakfast.  She found herself not eating until 2 p.m.  Would still not have an appetite, but would recognize the need to eat.  Would eat half of a salad from [employer] or eat just part of a chicken sandwich.  Was forcing herself to eat.  Would stop eating as she would develop a lot of gas and acid up in chest and throat. And then down lower in her abdomen would feel bloated with gas.   Her stools have not been as regular.   Takes 1 tab of Percocet 2 to 3 times a day, but was taking this back in 2018 a similar way.  Continues to take medication for chronic pain following hip and low back pain.  Has had left hip replacement and lumbar laminectomy.  Stools can be hard or loose.  Her abdominal discomfort improves for a while once she passes flatus or BMs.  No melena or hematochezia.  Does occasionally gag and feel the need to vomit, but that does not happen often. Also dealing with chronic dental pain.  Describes not going to grocery very often as difficulty chewing the food.   Tried drinking a Boost 2 times daily, but started making stomach hurt as well.    Stopped smoking since May 28, 2021.  Would like Chantix to help as she is struggling.    No alcohol since she was in her early 30s.  Drinking 12 oz soda daily--Pepsi and [facility].   Occasionally, uses Ginger Ale when feels full of gas.    Chocolate intake is rare.    Not much in way of tomatoes or onions.    Does not lie down after eating for hours.    Sleeps on her side with 4 pillows to cushion her body now that she has lost weight.    In work up of weight loss with [name] provider has had the following:  [place]:  CT of chest--has not received results.  Had EGD and colonoscopy with [facility] referral, which were reportedly normal--performed in Sept/Oct.  Had CT scan from ED of abdomen and pelvis which only showed diverticulosis, but no cause for abdominal pain and weight loss.    She lists multiple minerals and Vitamins that she is taking now.   States taking B12, MV gummy, Vit D, Vit A--but then states she is only taking once monthly.     2.  COPD:  Has poor ventilation and neighbor smokes MJ--very strong and irritates her breathing.  Lives in [place].  Neighbor smokes all day long.  She would be interested in a Hospital doctor with [facility].  She has sent her landlord 5 letters in past  2 years.  Mr. Marlene BastMason is the office manager.  Believe this is a city owned residence.  She has had 3 different neighbors living next to her that smoke MJ.   Was recently seen in ED for exacerbation and placed on prednisone burst and taper.     Current Meds  Medication Sig   albuterol (PROVENTIL) (2.5 MG/3ML) 0.083% nebulizer solution Take 3 mLs (2.5 mg total) by nebulization every 6 (six) hours as needed for wheezing or shortness of breath.   albuterol (VENTOLIN HFA) 108 (90 Base) MCG/ACT inhaler Inhale 2 puffs into the lungs every 4 (four) hours as needed for wheezing or shortness of breath.   enalapril-hydrochlorothiazide (VASERETIC) 10-25 MG tablet TAKE 1 TABLET BY MOUTH DAILY   famotidine (PEPCID) 20 MG tablet Take 20 mg by mouth daily as needed for heartburn or indigestion.   Multiple Vitamin (MULTIVITAMIN) capsule Take 1 capsule by mouth daily.    oxyCODONE-acetaminophen (PERCOCET) 10-325 MG tablet Take 1 tablet by mouth every 4 (four) hours as needed for pain.   zolpidem (AMBIEN) 5 MG tablet Take 5 mg by mouth at bedtime.   No Known Allergies   Review of Systems    Objective:   BP (!) 146/86 (BP Location: Left Arm, Patient Position: Sitting, Cuff Size: Normal)    Pulse 80    Resp 16    Ht 5' 0.6" (1.539 m)    Wt 97 lb (44 kg)    BMI 18.57 kg/m   Physical Exam NAD HEENT:  PERRL, EOMI, TMs pearly gray, throat without injection.   Neck:  Supple, No adenopathy, no thyromegaly Chest:  Somewhat hyperinflated.  Decreased BS throughout with diffuse expiratory wheeze.  No crackles.  Resonant to percussion. CV:  RRR with normal S1 and S2, No S3, S4 or murmur.  No carotid bruits.  Carotid, radial and DP pulses normal and equal Abd:  S, NT, No HSM or mass, + BS LE:  No edema.   Assessment & Plan    HM:  refuses influenza vaccine--makes her sick.  Had a Pneumovaxx in 2015 or 2016--[facility].  Has had COVID vaccine x 3 Pfizer.  Not interested in COVID bivalent today, nor Tdap, Shingrix, or pneumococcal vaccines.  Will think on those.  2.  Unintentional Weight Loss:  [facility] scan of abdomen and pelvis and of chest, as well as colonoscopy.  Reportedly all are normal Suspect weight loss is due to GERD that has not been well controlled and her work to breathe   Start maintenance inhaler medication with Advair 250/50 1 inhalation twice daily.  To only use Albuterol as needed.  She was able to repeat how to use the meds. As she has had an elevated TSH in past, recheck TSH and Free T4.  Addendum on 06/21/21:  received her records which showed unremarkable 06/14/2021 CT of abdomen and pelvis; low dose screening 06/14/2021 CT of chest showed emphysematous lung changes without other concerning findings and upper abdominal viscera that could be seen were normal;  Colonoscopy on 03/25/2021 was just of  left colon due to looping of bowel, but what was viewed showed only diverticulosis and small internal hemorrhoids  3.  Low potassium with recent blood draw--repeat testing today to clarify if real.  4.  Hypertension:  up today, but very agitated.  Recheck in 2 months.  5.  GERD:  Start Omeprazole 40 mg daily.  To take on empty stomach 1/2 hour before  breakfast and then morning meds.  She was taking with her other meds previously.  Elevate HOB.  Small frequent meals.Marland Kitchen  GERD precautions discussed at length.

## 2021-06-16 NOTE — Telephone Encounter (Signed)
Seen by Dr Mulberry 

## 2021-06-17 LAB — BASIC METABOLIC PANEL
BUN/Creatinine Ratio: 25 (ref 12–28)
BUN: 18 mg/dL (ref 8–27)
CO2: 30 mmol/L — ABNORMAL HIGH (ref 20–29)
Calcium: 9.6 mg/dL (ref 8.7–10.3)
Chloride: 99 mmol/L (ref 96–106)
Creatinine, Ser: 0.73 mg/dL (ref 0.57–1.00)
Glucose: 81 mg/dL (ref 70–99)
Potassium: 3.8 mmol/L (ref 3.5–5.2)
Sodium: 144 mmol/L (ref 134–144)
eGFR: 90 mL/min/{1.73_m2} (ref >59)

## 2021-06-17 LAB — TSH: TSH: 2.04 u[IU]/mL (ref 0.450–4.500)

## 2021-06-17 LAB — T4, FREE: Free T4: 1.13 ng/dL (ref 0.82–1.77)

## 2021-07-20 ENCOUNTER — Ambulatory Visit: Payer: Medicare HMO | Admitting: Internal Medicine

## 2021-12-01 ENCOUNTER — Emergency Department (HOSPITAL_COMMUNITY): Payer: Medicare HMO

## 2021-12-01 ENCOUNTER — Other Ambulatory Visit: Payer: Self-pay

## 2021-12-01 ENCOUNTER — Emergency Department (HOSPITAL_COMMUNITY)
Admission: EM | Admit: 2021-12-01 | Discharge: 2021-12-02 | Disposition: A | Discharge status: Home / Self Care | Payer: Medicare HMO | Attending: Emergency Medicine | Admitting: Emergency Medicine

## 2021-12-01 DIAGNOSIS — R0789 Other chest pain: Secondary | ICD-10-CM | POA: Insufficient documentation

## 2021-12-01 DIAGNOSIS — J449 Chronic obstructive pulmonary disease, unspecified: Secondary | ICD-10-CM | POA: Diagnosis not present

## 2021-12-01 DIAGNOSIS — Z7951 Long term (current) use of inhaled steroids: Secondary | ICD-10-CM | POA: Insufficient documentation

## 2021-12-01 DIAGNOSIS — R0602 Shortness of breath: Secondary | ICD-10-CM | POA: Insufficient documentation

## 2021-12-01 DIAGNOSIS — I10 Essential (primary) hypertension: Secondary | ICD-10-CM | POA: Insufficient documentation

## 2021-12-01 DIAGNOSIS — E876 Hypokalemia: Secondary | ICD-10-CM | POA: Diagnosis not present

## 2021-12-01 LAB — CBC WITH DIFFERENTIAL/PLATELET
Abs Immature Granulocytes: 0.02 10*3/uL (ref 0.00–0.07)
Basophils Absolute: 0 10*3/uL (ref 0.0–0.1)
Basophils Relative: 0 %
Eosinophils Absolute: 0.1 10*3/uL (ref 0.0–0.5)
Eosinophils Relative: 1 %
HCT: 42.9 % (ref 36.0–46.0)
Hemoglobin: 13.6 g/dL (ref 12.0–15.0)
Immature Granulocytes: 0 %
Lymphocytes Relative: 34 %
Lymphs Abs: 2.8 10*3/uL (ref 0.7–4.0)
MCH: 29.6 pg (ref 26.0–34.0)
MCHC: 31.7 g/dL (ref 30.0–36.0)
MCV: 93.3 fL (ref 80.0–100.0)
Monocytes Absolute: 0.8 10*3/uL (ref 0.1–1.0)
Monocytes Relative: 9 %
Neutro Abs: 4.4 10*3/uL (ref 1.7–7.7)
Neutrophils Relative %: 56 %
Platelets: 286 10*3/uL (ref 150–400)
RBC: 4.6 MIL/uL (ref 3.87–5.11)
RDW: 13.8 % (ref 11.5–15.5)
WBC: 8.1 10*3/uL (ref 4.0–10.5)
nRBC: 0 % (ref 0.0–0.2)

## 2021-12-01 LAB — TROPONIN I (HIGH SENSITIVITY)
Troponin I (High Sensitivity): 5 ng/L (ref <18)
Troponin I (High Sensitivity): 5 ng/L (ref <18)

## 2021-12-01 LAB — D-DIMER, QUANTITATIVE: D-Dimer, Quant: >20 ug/mL-FEU — ABNORMAL HIGH (ref 0.00–0.50)

## 2021-12-01 LAB — COMPREHENSIVE METABOLIC PANEL
ALT: 12 U/L (ref 0–44)
AST: 19 U/L (ref 15–41)
Albumin: 3.8 g/dL (ref 3.5–5.0)
Alkaline Phosphatase: 46 U/L (ref 38–126)
Anion gap: 11 (ref 5–15)
BUN: 14 mg/dL (ref 8–23)
CO2: 29 mmol/L (ref 22–32)
Calcium: 9.6 mg/dL (ref 8.9–10.3)
Chloride: 100 mmol/L (ref 98–111)
Creatinine, Ser: 0.73 mg/dL (ref 0.44–1.00)
GFR, Estimated: >60 mL/min (ref >60)
Glucose, Bld: 92 mg/dL (ref 70–99)
Potassium: 3 mmol/L — ABNORMAL LOW (ref 3.5–5.1)
Sodium: 140 mmol/L (ref 135–145)
Total Bilirubin: 0.7 mg/dL (ref 0.3–1.2)
Total Protein: 6.2 g/dL — ABNORMAL LOW (ref 6.5–8.1)

## 2021-12-01 LAB — BRAIN NATRIURETIC PEPTIDE: B Natriuretic Peptide: 7.9 pg/mL (ref 0.0–100.0)

## 2021-12-01 MED ORDER — MORPHINE SULFATE (PF) 4 MG/ML IV SOLN
4.0000 mg | Freq: Once | INTRAVENOUS | Status: AC
  Administered 2021-12-01: 4 mg via INTRAVENOUS
  Filled 2021-12-01: qty 1

## 2021-12-01 MED ORDER — IPRATROPIUM-ALBUTEROL 0.5-2.5 (3) MG/3ML IN SOLN
3.0000 mL | Freq: Once | RESPIRATORY_TRACT | Status: AC
  Administered 2021-12-01: 3 mL via RESPIRATORY_TRACT
  Filled 2021-12-01: qty 3

## 2021-12-01 MED ORDER — IOHEXOL 350 MG/ML SOLN
68.0000 mL | Freq: Once | INTRAVENOUS | Status: AC | PRN
  Administered 2021-12-01: 68 mL via INTRAVENOUS

## 2021-12-01 MED ORDER — ONDANSETRON HCL 4 MG/2ML IJ SOLN
4.0000 mg | Freq: Once | INTRAMUSCULAR | Status: AC
  Administered 2021-12-01: 4 mg via INTRAVENOUS
  Filled 2021-12-01: qty 2

## 2021-12-01 MED ORDER — POTASSIUM CHLORIDE CRYS ER 20 MEQ PO TBCR
40.0000 meq | EXTENDED_RELEASE_TABLET | Freq: Once | ORAL | Status: AC
  Administered 2021-12-01: 40 meq via ORAL
  Filled 2021-12-01: qty 2

## 2021-12-01 MED ORDER — LIDOCAINE 5 % EX PTCH: 1.0000 | MEDICATED_PATCH | CUTANEOUS | 0 refills | Status: DC

## 2021-12-01 MED ORDER — LIDOCAINE 5 % EX PTCH
1.0000 | MEDICATED_PATCH | CUTANEOUS | Status: DC
Start: 2021-12-02 — End: 2021-12-02

## 2021-12-01 NOTE — ED Triage Notes (Signed)
Pt here from home d/t CP started . Pressure on the center, radiates to R shoulder. Also c/o SOB which has been going on for a week. 99% on RA. NSR w EMS. 0.5 nitro and 324 ASA. given by EMS. Hx of COPD and hypertension. A&O X4. VSS.   134/86 initial BP.

## 2021-12-01 NOTE — Discharge Instructions (Addendum)
You were seen in the ED for evaluation of your chest pain and shortness of breath. I likely think this is from your emphysema. You have refills left on your Advair, please go to your pharmacy to pick these up. I have placed a referral to a cardiologist. Please schedule an appointment with them for follow up. You can take your already prescribed pain medication as needed for this pain as well. Please follow up with your PCP is a few days for re-evaluation as well as a re-check on your potassium. If you have any worsening chest pain, SOB, fever, lightheadedness, please return to the ER for re-evaluation.   Contact a doctor if: Your chest pain does not go away. You feel depressed. You have a fever. Get help right away if: Your chest pain is worse. You have a cough that gets worse, or you cough up blood. You have very bad (severe) pain in your belly (abdomen). You pass out (faint). You have either of these for no clear reason: Sudden chest discomfort. Sudden discomfort in your arms, back, neck, or jaw. You have shortness of breath at any time. You suddenly start to sweat, or your skin gets clammy. You feel sick to your stomach (nauseous). You throw up (vomit). You suddenly feel lightheaded or dizzy. You feel very weak or tired. Your heart starts to beat fast, or it feels like it is skipping beats. These symptoms may be an emergency. Do not wait to see if the symptoms will go away. Get medical help right away. Call your local emergency services (911 in the [place] Do not drive yourself to the hospital.

## 2021-12-01 NOTE — ED Provider Notes (Signed)
[facility] Provider Note   CSN: [id number] Arrival date & time: 12/01/21  1729     History Chief Complaint  Patient presents with   Chest Pain    [name] is a 66 y.o. female with h/o HTN, COPD presents to the ED for evaluation of chest pain and SOB. The patient reports she has been feeling SOB for the past week. She has been out of her Advair discus. She reports the SOB if worse on exertion. Around 108min to 1 hour PTA, the patient reports she began to have central chest pressure that has been constant. She report that occasionally it will radiate to her right shoulder. She denies any nausea, vomiting, cough, fever, abdominal pain, of diaphoresis. Denies any numbness or tingling. EMS gave the patient 0.5 of nitro and 324 of ASA.    Chest Pain Associated symptoms: shortness of breath   Associated symptoms: no abdominal pain, no cough, no diaphoresis, no fever, no headache, no nausea, no palpitations and no vomiting        Home Medications Prior to Admission medications   Medication Sig Start Date End Date Taking? Authorizing Provider  albuterol (PROVENTIL) (2.5 MG/3ML) 0.083% nebulizer solution Take 3 mLs (2.5 mg total) by nebulization every 6 (six) hours as needed for wheezing or shortness of breath. 06/10/21   Loni Beckwith, PA-C  albuterol (VENTOLIN HFA) 108 (90 Base) MCG/ACT inhaler Inhale 2 puffs into the lungs every 4 (four) hours as needed for wheezing or shortness of breath. 06/10/21   Loni Beckwith, PA-C  enalapril-hydrochlorothiazide (VASERETIC) 10-25 MG tablet TAKE 1 TABLET BY MOUTH DAILY 06/08/21   Mack Hook, MD  fluticasone-salmeterol (ADVAIR DISKUS) 250-50 MCG/ACT AEPB 1 inhalation twice daily followed by cleaning teeth and tongue 06/16/21   Mack Hook, MD  Multiple Vitamin (MULTIVITAMIN) capsule Take 1 capsule by mouth daily.    [provider]  omeprazole (PRILOSEC) 40 MG capsule 1 cap by  mouth in morning daily 1/2 hour before breakfast or other meds. 06/16/21   Mack Hook, MD  oxyCODONE-acetaminophen (PERCOCET) 10-325 MG tablet Take 1 tablet by mouth every 4 (four) hours as needed for pain. 12/23/20   [provider]  zolpidem (AMBIEN) 5 MG tablet Take 5 mg by mouth at bedtime. 12/25/20   [provider]      Allergies    Patient has no known allergies.    Review of Systems   Review of Systems  Constitutional:  Negative for chills, diaphoresis and fever.  Respiratory:  Positive for chest tightness and shortness of breath. Negative for cough.   Cardiovascular:  Positive for chest pain. Negative for palpitations.  Gastrointestinal:  Negative for abdominal pain, nausea and vomiting.  Neurological:  Negative for light-headedness and headaches.    Physical Exam Updated Vital Signs BP 104/75 (BP Location: Right Arm)   Pulse 88   Temp 98.5 F (36.9 C) (Oral)   Resp (!) 29   SpO2 99%  Physical Exam Vitals and nursing note reviewed.  Constitutional:      General: She is not in acute distress.    Appearance: Normal appearance. She is not toxic-appearing.  HENT:     Head: Normocephalic and atraumatic.     Mouth/Throat:     Mouth: Mucous membranes are moist.  Eyes:     General: No scleral icterus. Cardiovascular:     Rate and Rhythm: Normal rate and regular rhythm.     Pulses: Normal pulses.  Pulmonary:  Effort: Pulmonary effort is normal. No respiratory distress.     Comments: Diminshed breath sounds throughout. Mildly tachypenic at 22.  No respiratory distress, accessory muscle use, nasal flaring, tripoding, or cyanosis present.  Patient speaking in full sentences with ease and is satting well on room air. Abdominal:     General: Abdomen is flat. Bowel sounds are normal.     Palpations: Abdomen is soft.     Tenderness: There is no abdominal tenderness. There is no guarding or rebound.  Musculoskeletal:        General: No deformity.      Cervical back: Normal range of motion.  Skin:    General: Skin is warm and dry.  Neurological:     General: No focal deficit present.     Mental Status: She is alert. Mental status is at baseline.     ED Results / Procedures / Treatments   Labs (all labs ordered are listed, but only abnormal results are displayed) Labs Reviewed  COMPREHENSIVE METABOLIC PANEL - Abnormal; Notable for the following components:      Result Value   Potassium 3.0 (*)    Total Protein 6.2 (*)    All other components within normal limits  D-DIMER, QUANTITATIVE - Abnormal; Notable for the following components:   D-Dimer, Quant >20.00 (*)    All other components within normal limits  CBC WITH DIFFERENTIAL/PLATELET  BRAIN NATRIURETIC PEPTIDE  TROPONIN I (HIGH SENSITIVITY)  TROPONIN I (HIGH SENSITIVITY)    EKG EKG Interpretation  Date/Time:  Friday December 01 2021 17:43:21 EDT Ventricular Rate:  82 PR Interval:  151 QRS Duration: 98 QT Interval:  473 QTC Calculation: 553 R Axis:   88 Text Interpretation: Sinus arrhythmia Borderline right axis deviation Probable anteroseptal infarct, old Prolonged QT interval No significant change since last tracing Confirmed by Isla Pence [phone]) on 12/01/2021 5:54:34 PM  Radiology CT Angio Chest PE W and/or Wo Contrast  Result Date: 12/01/2021 CLINICAL DATA:  Pulmonary embolism (PE) suspected, positive D-dimer Chest pain. EXAM: CT ANGIOGRAPHY CHEST WITH CONTRAST TECHNIQUE: Multidetector CT imaging of the chest was performed using the standard protocol during bolus administration of intravenous contrast. Multiplanar CT image reconstructions and MIPs were obtained to evaluate the vascular anatomy. RADIATION DOSE REDUCTION: This exam was performed according to the departmental dose-optimization program which includes automated exposure control, adjustment of the mA and/or kV according to patient size and/or use of iterative reconstruction technique. CONTRAST:  83mL  OMNIPAQUE IOHEXOL 350 MG/ML SOLN COMPARISON:  Radiograph earlier today. FINDINGS: Cardiovascular: There are no filling defects within the pulmonary arteries to suggest pulmonary embolus. There is contrast mixing in the pulmonary veins. Mild dilatation of the main pulmonary artery at 3.6 cm. The heart is normal in size. Mild aortic atherosclerosis. Common origin of the brachiocephalic and left common carotid artery. Small pericardial effusion. Mediastinum/Nodes: No mediastinal or hilar adenopathy. Esophagus mildly patulous. No esophageal wall thickening. No thyroid nodule. Lungs/Pleura: Moderate emphysema. Mild central bronchial thickening with occasional mucoid impaction in the lower lobes. No pneumothorax. No focal or confluent airspace disease. No pleural fluid. No pulmonary nodule or mass. Upper Abdomen: No acute or unexpected findings Musculoskeletal: There are no acute or suspicious osseous abnormalities. Review of the MIP images confirms the above findings. IMPRESSION: 1. No pulmonary embolus. 2. Mild dilatation of the main pulmonary artery suggesting pulmonary arterial hypertension. 3. Moderate emphysema. Mild bronchial thickening with occasional mucoid impaction in the lower lobes. 4. Small pericardial effusion. Aortic Atherosclerosis (ICD10-I70.0) and Emphysema (ICD10-J43.9).  Electronically Signed   By: Narda Rutherford M.D.   On: 12/01/2021 23:20   DG Chest 2 View  Result Date: 12/01/2021 CLINICAL DATA:  Chest pain/shortness of breath EXAM: CHEST - 2 VIEW COMPARISON:  Radiograph 06/10/2021 FINDINGS: Unchanged cardiomediastinal silhouette. There is no focal airspace consolidation. There is no pleural effusion. There are skin folds overlying the upper chest bilaterally, more convincing on the left. The right apical chest is indeterminate for skin fold versus pneumothorax. No acute osseous abnormality. Thoracic spondylosis. IMPRESSION: Probable skin fold versus potential small right apical pneumothorax.  Could obtain a left lateral decubitus radiograph or a chest CT to be definitive. No focal airspace consolidation. Electronically Signed   By: Caprice Renshaw M.D.   On: 12/01/2021 19:02    Procedures Procedures   Medications Ordered in ED Medications  ipratropium-albuterol (DUONEB) 0.5-2.5 (3) MG/3ML nebulizer solution 3 mL (3 mLs Nebulization Given 12/01/21 1902)  morphine (PF) 4 MG/ML injection 4 mg (4 mg Intravenous Given 12/01/21 1838)  ondansetron (ZOFRAN) injection 4 mg (4 mg Intravenous Given 12/01/21 1838)  morphine (PF) 4 MG/ML injection 4 mg (4 mg Intravenous Given 12/01/21 2058)  potassium chloride SA (KLOR-CON M) CR tablet 40 mEq (40 mEq Oral Given 12/01/21 2329)  iohexol (OMNIPAQUE) 350 MG/ML injection 68 mL (68 mLs Intravenous Contrast Given 12/01/21 2308)    ED Course/ Medical Decision Making/ A&P                           Medical Decision Making Amount and/or Complexity of Data Reviewed Labs: ordered. Radiology: ordered.  Risk Prescription drug management.   66 year old female presents to the emergency room for evaluation of shortness of breath and chest pain.  Differential diagnosis includes was not limited to ACS, arrhythmia, angina, pneumonia, pneumothorax, dissection, aneurysm, PE, MSK.  Vital signs show increase respiratory rate otherwise unremarkable.  Physical exam as marked above.  We will order chest pain work-up and chest x-ray.  I independently reviewed and interpreted the patient's labs.  D-dimer shows >20. Unsure of lab error or accurate. Will need to obtain CT PE study for evaluation.  BNP normal at 7.9.  CMP shows mild hypokalemia at 3.0.  Mild decrease in total protein at 6.2 otherwise no LFT or electrolyte abnormality.  CBC shows no leukocytosis or anemia.  Troponins flat at 5, delta 0.  Chest x-ray shows probable skin fold versus potential small right apical pneumothorax. Could obtain a left lateral decubitus radiograph or a chest CT to be definitive. No focal  airspace consolidation. CT PE study shows No pulmonary embolus. 2. Mild dilatation of the main pulmonary artery suggesting pulmonary arterial hypertension. 3. Moderate emphysema. Mild bronchial thickening with occasional mucoid impaction in the lower lobes. 4. Small pericardial effusion. No pneumothorax.   EKG reviewed and interpreted by my attending shows sinus arrhythmia with borderline right axis deviation, probable anteroseptal infarct, old.  Prolonged QT.  No significant change since last tracing.  Morphine was ordered initially for patient's chest pain as well as some Zofran.  Patient was having some lower chronic back pain and another dose of morphine was given.  She was also ordered some potassium for replenishment of her low potassium.  I ordered her a DuoNeb as well.  On reevaluation, patient reports that her chest pain is still present although it has improved.  Patient's respiratory rates have improved.  Pulse rates have been steady.  Blood pressure in normal range.  Patient is satting  well on room air without any increased work of breathing since [employer].  I doubt any ACS given EKG and troponins.  No arrhythmia seen.  Could be her angina.  No pneumonia, pneumothorax, PE seen on CT imaging.  Doubt any dissection or aneurysm.  Could also be MSK pain.  Likely the patient's emphysema from her medical noncompliance to her inhalers.  I likely think this patient's chest pain and shortness of breath is from her compliancy to her medications.  The patient has multiple refills of her Advair inhaler.  I advised her to pick this up to continue the use.  I also asked that she return to her primary care doctor to recheck her potassium level in a few days to see if it is still low. Patient is on narcotics for chronic pain already. Will discharge with lidocaine patches and place an ambulatory referral to cardiology for her to follow up with. We discussed strict return precautions and red flag symptoms. The  patient verbalizes understanding and agrees to the plan. The patient is stable and being discharged home in good condition.   I discussed this case with my attending physician who cosigned this note including patient's presenting symptoms, physical exam, and planned diagnostics and interventions. Attending physician stated agreement with plan or made changes to plan which were implemented.   Attending physician assessed patient at bedside.  Final Clinical Impression(s) / ED Diagnoses Final diagnoses:  Atypical chest pain  Hypokalemia  SOB (shortness of breath)    Rx / DC Orders ED Discharge Orders          Ordered    Ambulatory referral to Cardiology  Status:  Canceled       Comments: If you have not heard from the Cardiology office within the next 72 hours please call [phone].   12/01/21 2343    Ambulatory referral to Cardiology       Comments: If you have not heard from the Cardiology office within the next 72 hours please call [phone].   12/01/21 2348    lidocaine (LIDODERM) 5 %  Every 24 hours        12/01/21 2354              Achille Rich, PA-C 12/03/21 3790    [name], MD 12/04/21 [phone]

## 2021-12-02 NOTE — ED Notes (Signed)
Patient refused discharge paper work prior to leaving department. Patient stable at time of departure.

## 2021-12-05 ENCOUNTER — Other Ambulatory Visit: Payer: Self-pay | Admitting: Internal Medicine

## 2022-01-17 ENCOUNTER — Ambulatory Visit: Payer: Medicare HMO | Admitting: Internal Medicine

## 2022-02-27 ENCOUNTER — Ambulatory Visit: Payer: Medicare HMO | Admitting: Internal Medicine

## 2022-06-02 ENCOUNTER — Emergency Department (HOSPITAL_COMMUNITY)
Admission: EM | Admit: 2022-06-02 | Discharge: 2022-06-03 | Disposition: A | Discharge status: Home / Self Care | Payer: Medicare HMO | Attending: Emergency Medicine | Admitting: Emergency Medicine

## 2022-06-02 ENCOUNTER — Encounter (HOSPITAL_COMMUNITY): Payer: Self-pay | Admitting: Pharmacy Technician

## 2022-06-02 DIAGNOSIS — I10 Essential (primary) hypertension: Secondary | ICD-10-CM | POA: Diagnosis not present

## 2022-06-02 DIAGNOSIS — Z79899 Other long term (current) drug therapy: Secondary | ICD-10-CM | POA: Diagnosis not present

## 2022-06-02 DIAGNOSIS — F4321 Adjustment disorder with depressed mood: Secondary | ICD-10-CM | POA: Diagnosis not present

## 2022-06-02 DIAGNOSIS — J45909 Unspecified asthma, uncomplicated: Secondary | ICD-10-CM | POA: Insufficient documentation

## 2022-06-02 DIAGNOSIS — Z046 Encounter for general psychiatric examination, requested by authority: Secondary | ICD-10-CM | POA: Diagnosis present

## 2022-06-02 DIAGNOSIS — T50904A Poisoning by unspecified drugs, medicaments and biological substances, undetermined, initial encounter: Secondary | ICD-10-CM | POA: Diagnosis not present

## 2022-06-02 DIAGNOSIS — F17210 Nicotine dependence, cigarettes, uncomplicated: Secondary | ICD-10-CM | POA: Diagnosis not present

## 2022-06-02 DIAGNOSIS — Z1152 Encounter for screening for COVID-19: Secondary | ICD-10-CM | POA: Diagnosis not present

## 2022-06-02 LAB — COMPREHENSIVE METABOLIC PANEL
ALT: 9 U/L (ref 0–44)
AST: 15 U/L (ref 15–41)
Albumin: 3.8 g/dL (ref 3.5–5.0)
Alkaline Phosphatase: 46 U/L (ref 38–126)
Anion gap: 7 (ref 5–15)
BUN: 12 mg/dL (ref 8–23)
CO2: 33 mmol/L — ABNORMAL HIGH (ref 22–32)
Calcium: 9.1 mg/dL (ref 8.9–10.3)
Chloride: 98 mmol/L (ref 98–111)
Creatinine, Ser: 0.68 mg/dL (ref 0.44–1.00)
GFR, Estimated: >60 mL/min (ref >60)
Glucose, Bld: 92 mg/dL (ref 70–99)
Potassium: 3.4 mmol/L — ABNORMAL LOW (ref 3.5–5.1)
Sodium: 138 mmol/L (ref 135–145)
Total Bilirubin: 0.5 mg/dL (ref 0.3–1.2)
Total Protein: 6.4 g/dL — ABNORMAL LOW (ref 6.5–8.1)

## 2022-06-02 LAB — URINALYSIS, ROUTINE W REFLEX MICROSCOPIC
Bilirubin Urine: NEGATIVE
Glucose, UA: NEGATIVE mg/dL
Hgb urine dipstick: NEGATIVE
Ketones, ur: NEGATIVE mg/dL
Nitrite: NEGATIVE
Protein, ur: NEGATIVE mg/dL
Specific Gravity, Urine: 1.019 (ref 1.005–1.030)
pH: 5 (ref 5.0–8.0)

## 2022-06-02 LAB — CBC WITH DIFFERENTIAL/PLATELET
Abs Immature Granulocytes: 0 10*3/uL (ref 0.00–0.07)
Basophils Absolute: 0 10*3/uL (ref 0.0–0.1)
Basophils Relative: 1 %
Eosinophils Absolute: 0.1 10*3/uL (ref 0.0–0.5)
Eosinophils Relative: 1 %
HCT: 37.8 % (ref 36.0–46.0)
Hemoglobin: 11.8 g/dL — ABNORMAL LOW (ref 12.0–15.0)
Immature Granulocytes: 0 %
Lymphocytes Relative: 51 %
Lymphs Abs: 2.7 10*3/uL (ref 0.7–4.0)
MCH: 29.4 pg (ref 26.0–34.0)
MCHC: 31.2 g/dL (ref 30.0–36.0)
MCV: 94 fL (ref 80.0–100.0)
Monocytes Absolute: 0.5 10*3/uL (ref 0.1–1.0)
Monocytes Relative: 8 %
Neutro Abs: 2.1 10*3/uL (ref 1.7–7.7)
Neutrophils Relative %: 39 %
Platelets: 293 10*3/uL (ref 150–400)
RBC: 4.02 MIL/uL (ref 3.87–5.11)
RDW: 14.3 % (ref 11.5–15.5)
WBC: 5.3 10*3/uL (ref 4.0–10.5)
nRBC: 0 % (ref 0.0–0.2)

## 2022-06-02 LAB — ETHANOL: Alcohol, Ethyl (B): <10 mg/dL (ref <10)

## 2022-06-02 LAB — RESP PANEL BY RT-PCR (RSV, FLU A&B, COVID)  RVPGX2
Influenza A by PCR: NEGATIVE
Influenza B by PCR: NEGATIVE
Resp Syncytial Virus by PCR: NEGATIVE
SARS Coronavirus 2 by RT PCR: NEGATIVE

## 2022-06-02 LAB — RAPID URINE DRUG SCREEN, HOSP PERFORMED
Amphetamines: NOT DETECTED
Barbiturates: NOT DETECTED
Benzodiazepines: NOT DETECTED
Cocaine: NOT DETECTED
Opiates: POSITIVE — AB
Tetrahydrocannabinol: NOT DETECTED

## 2022-06-02 MED ORDER — ALBUTEROL SULFATE HFA 108 (90 BASE) MCG/ACT IN AERS: 2.0000 | INHALATION_SPRAY | RESPIRATORY_TRACT | Status: DC | PRN

## 2022-06-02 MED ORDER — ALBUTEROL SULFATE (2.5 MG/3ML) 0.083% IN NEBU: 2.5000 mg | INHALATION_SOLUTION | RESPIRATORY_TRACT | Status: DC | PRN

## 2022-06-02 MED ORDER — PANTOPRAZOLE SODIUM 40 MG PO TBEC: 40.0000 mg | DELAYED_RELEASE_TABLET | Freq: Every day | ORAL | Status: DC

## 2022-06-02 MED ORDER — ZOLPIDEM TARTRATE 5 MG PO TABS
5.0000 mg | ORAL_TABLET | Freq: Every day | ORAL | Status: DC
  Administered 2022-06-03: 5 mg via ORAL
  Filled 2022-06-02: qty 1

## 2022-06-02 MED ORDER — ENALAPRIL-HYDROCHLOROTHIAZIDE 10-25 MG PO TABS: 1.0000 | ORAL_TABLET | Freq: Every day | ORAL | Status: DC

## 2022-06-02 MED ORDER — HYDROCHLOROTHIAZIDE 25 MG PO TABS
25.0000 mg | ORAL_TABLET | Freq: Every day | ORAL | Status: DC
  Administered 2022-06-03: 25 mg via ORAL
  Filled 2022-06-02: qty 1

## 2022-06-02 MED ORDER — ENALAPRIL MALEATE 10 MG PO TABS
10.0000 mg | ORAL_TABLET | Freq: Every day | ORAL | Status: DC
  Administered 2022-06-03: 10 mg via ORAL
  Filled 2022-06-02: qty 1
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
## 2022-06-02 NOTE — ED Notes (Signed)
IVC paperwork complete and in orange zone, expires 06/09/22

## 2022-06-02 NOTE — ED Provider Triage Note (Signed)
Emergency Medicine Provider Triage Evaluation Note  [name] , a 67 y.o. female  was evaluated in triage.  Brought in by PD from home due to suicidal ideation by overdose of Ambien.  Had reportedly texted this to a friend.  Hx of suicide attempt via same.  Pt here voluntarily and very cooperative per PD.  Per record review, pt has reported suicidal attempts at least four times prior, usually via overdose of medications.    After PD has left ED, pt now states she would like to go home and had not wanted to kill herself.    Review of Systems  Positive:  Negative: See above  Physical Exam  There were no vitals taken for this visit. Gen:   Awake, no distress   Resp:  Normal effort  MSK:   Moves extremities without difficulty  Other:  Sitting comfortably.  AAOx4.  Medical Decision Making  Medically screening exam initiated at 5:06 PM.  Appropriate orders placed.  [name] was informed that the remainder of the evaluation will be completed by another provider, this initial triage assessment does not replace that evaluation, and the importance of remaining in the ED until their evaluation is complete.  Discussed pt case with attending Dr. Tamera Punt.  Based on history, presentation, and PD reports, patient appears to be a danger to herself.  IVC paperwork completed and provided to secretary.   [facility] Rome, PA-C 40/81/44 [id number]

## 2022-06-02 NOTE — ED Provider Notes (Signed)
[place] EMERGENCY DEPARTMENT Provider Note   CSN: [id number] Arrival date & time: 06/02/22  1702     History  Chief Complaint  Patient presents with   BH Eval    [name] is a 67 y.o. female.  HPI Denies pain, physical complaints, states that earlier in the day she texted a friend stating that she was upset, was going to go to sleep.  Seemingly this text may have included an endorsement of suicidal intent, and reported the patient has a history of prior suicide attempt with overdose.  Patient states that she was simply upset.    Home Medications Prior to Admission medications   Medication Sig Start Date End Date Taking? Authorizing Provider  albuterol (PROVENTIL) (2.5 MG/3ML) 0.083% nebulizer solution Take 3 mLs (2.5 mg total) by nebulization every 6 (six) hours as needed for wheezing or shortness of breath. 06/10/21   Loni Beckwith, PA-C  albuterol (VENTOLIN HFA) 108 (90 Base) MCG/ACT inhaler Inhale 2 puffs into the lungs every 4 (four) hours as needed for wheezing or shortness of breath. 06/10/21   Loni Beckwith, PA-C  enalapril-hydrochlorothiazide (VASERETIC) 10-25 MG tablet TAKE 1 TABLET BY MOUTH DAILY 12/08/21   Mack Hook, MD  fluticasone-salmeterol (ADVAIR DISKUS) 250-50 MCG/ACT AEPB 1 inhalation twice daily followed by cleaning teeth and tongue 06/16/21   Mack Hook, MD  lidocaine (LIDODERM) 5 % Place 1 patch onto the skin daily. Remove & Discard patch within 12 hours or as directed by MD 12/01/21   Sherrell Puller, PA-C  Multiple Vitamin (MULTIVITAMIN) capsule Take 1 capsule by mouth daily.    [provider]  omeprazole (PRILOSEC) 40 MG capsule 1 cap by mouth in morning daily 1/2 hour before breakfast or other meds. 06/16/21   Mack Hook, MD  oxyCODONE-acetaminophen (PERCOCET) 10-325 MG tablet Take 1 tablet by mouth every 4 (four) hours as needed for pain. 12/23/20   [provider]  zolpidem  (AMBIEN) 5 MG tablet Take 5 mg by mouth at bedtime. 12/25/20   [provider]      Allergies    Patient has no known allergies.    Review of Systems   Review of Systems  All other systems reviewed and are negative.   Physical Exam Updated Vital Signs BP 126/89 (BP Location: Left Arm)   Pulse 67   Temp 98 F (36.7 C)   Resp 16   SpO2 100%  Physical Exam Vitals and nursing note reviewed.  Constitutional:      General: She is not in acute distress.    Appearance: She is well-developed.  HENT:     Head: Normocephalic and atraumatic.  Eyes:     Conjunctiva/sclera: Conjunctivae normal.  Cardiovascular:     Rate and Rhythm: Normal rate and regular rhythm.  Pulmonary:     Effort: Pulmonary effort is normal. No respiratory distress.     Breath sounds: Normal breath sounds. No stridor.  Abdominal:     General: There is no distension.  Skin:    General: Skin is warm and dry.  Neurological:     Mental Status: She is alert and oriented to person, place, and time.     Cranial Nerves: No cranial nerve deficit.  Psychiatric:        Mood and Affect: Mood normal.     Comments: Patient denies suicidal or homicidal ideation     ED Results / Procedures / Treatments   Labs (all labs ordered are listed, but  only abnormal results are displayed) Labs Reviewed  URINALYSIS, ROUTINE W REFLEX MICROSCOPIC - Abnormal; Notable for the following components:      Result Value   APPearance HAZY (*)    Leukocytes,Ua TRACE (*)    Bacteria, UA RARE (*)    All other components within normal limits  RESP PANEL BY RT-PCR (RSV, FLU A&B, COVID)  [id number]  COMPREHENSIVE METABOLIC PANEL  RAPID URINE DRUG SCREEN, HOSP PERFORMED  CBC WITH DIFFERENTIAL/PLATELET  ETHANOL    EKG None  Radiology No results found.  Procedures Procedures    Medications Ordered in ED Medications - No data to display  ED Course/ Medical Decision Making/ A&P                           Medical Decision  Making Adult female with a history of sleep difficulty presents after alleged suicide attempt.  Patient is awake, alert, insightful, acknowledges difficulty with sleeping and was emotionally upset earlier in the day.  She has no physical complaints, labs are reassuring, vitals reassuring, reviewed, discussed, patient medically appropriate for behavioral health evaluation given IVC due to possible suicidal intent/overdose.  No evidence for overdose toxicity sequelae.  Amount and/or Complexity of Data Reviewed External Data Reviewed: notes. Labs:  Decision-making details documented in ED Course.  Final Clinical Impression(s) / ED Diagnoses Final diagnoses:  Overdose of undetermined intent, initial encounter     Gerhard Munch, MD 06/02/22 2028

## 2022-06-02 NOTE — ED Notes (Signed)
Pt becoming agitated, stating she wants to leave. Asking this RN for a cab voucher. Pt also endorses taking 2 ambien tonight.

## 2022-06-02 NOTE — ED Notes (Signed)
Patient refused to have EKG done. 

## 2022-06-02 NOTE — ED Notes (Signed)
Pt refusing medication and states that she wants to be discharged and understood Dr to say that after she was cleared, she could leave.

## 2022-06-02 NOTE — ED Notes (Signed)
Pt moved to private room for tele-psych eval. This RN is at bedside with pt

## 2022-06-02 NOTE — ED Triage Notes (Signed)
Pt bib [employer] with reports of SI. Pt texted someone and said that she would take all of her sleeping pills in an attempt to kill herself. Pt here voluntarily.

## 2022-06-02 NOTE — ED Notes (Signed)
Patient asked for their cell phone so that they can check on pet at home. This writer told pt that she could use one of the hospital phones to call and she became visibly upset. Stated "Does your phone have a camera?! I need to check on my dog now!". This Probation officer explained to pt that until she is up for discharge, it is policy not to give personal belongings back.

## 2022-06-02 NOTE — ED Notes (Signed)
Pt is calling granddaughter.

## 2022-06-02 NOTE — ED Notes (Signed)
Pt on phone with granddaughter. Pt aware of plan for admission

## 2022-06-02 NOTE — ED Notes (Signed)
Patient refused EKG when asked again by this Probation officer.

## 2022-06-03 DIAGNOSIS — F4321 Adjustment disorder with depressed mood: Secondary | ICD-10-CM

## 2022-06-03 MED ORDER — OXYCODONE-ACETAMINOPHEN 5-325 MG PO TABS
1.0000 | ORAL_TABLET | ORAL | Status: DC | PRN
  Administered 2022-06-03 (×3): 1 via ORAL
  Filled 2022-06-03 (×4): qty 1

## 2022-06-03 MED ORDER — OXYCODONE HCL 5 MG PO TABS: 5.0000 mg | ORAL_TABLET | ORAL | Status: DC | PRN

## 2022-06-03 MED ORDER — OXYCODONE-ACETAMINOPHEN 10-325 MG PO TABS: 1.0000 | ORAL_TABLET | ORAL | Status: DC | PRN

## 2022-06-03 NOTE — Discharge Instructions (Signed)

## 2022-06-03 NOTE — ED Notes (Signed)
Patient spoke with grandaughter, Nolene Bernheim, on the phone to set up a plan to come and get the keys to her house so that she can get her dog that was left in the home alone. Spoke with security so that keys could be obtained from patients belongings per patients consent and request to allow grandaughter to have her keys once she arrives. Grandaughter will also sign consent form for property.

## 2022-06-03 NOTE — ED Notes (Signed)
Pt does not want to be transferred anywhere else. Dr Vanita Panda aware and states that pt can stay here in ED for observation until she can be seen by [facility] team member in A.M. for re-eval.

## 2022-06-03 NOTE — ED Provider Notes (Addendum)
Emergency Medicine Observation Re-evaluation Note  JOSSELINE REDDIN is a 67 y.o. female, seen on rounds today.  Pt initially presented to the ED for complaints of BH Eval   Physical Exam  BP 129/81 (BP Location: Left Arm)   Pulse 61   Temp 97.7 F (36.5 C) (Oral)   Resp 18   SpO2 100%  Physical Exam General: awake, alert Cardiac: normal rate Lungs: normal effort Psych:   ED Course / MDM  EKG:   I have reviewed the labs performed to date as well as medications administered while in observation.  Recent changes in the last 24 hours include initial tts eval, med screening eval.  Plan  Plan initially was for TTS eval.  Pt declined transfer so plan was to re eval in am.   Dorie Rank, MD 06/03/22 [phone] Was reassessed by psychiatry this morning.  She is cleared for discharge   Dorie Rank, MD 06/03/22 [phone]

## 2022-06-03 NOTE — ED Notes (Signed)
RN updated pt on plan of care. RN informed pt that previous RN spoke with EDP that stated she can be observed overnight in ED and re-evaluated in the morning by psychiatry.   RN informed that she will notify counselor on duty to setup a session. Pt verbalized understanding.  Pt requested pain medication for back pain. Pt states she have been taking percocet for relief. RN will check MAR

## 2022-06-03 NOTE — ED Notes (Signed)
Pt sleeping in bed

## 2022-06-03 NOTE — ED Notes (Signed)
IVC rescind paperwork completed and faxed by this secretary
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
## 2022-07-24 ENCOUNTER — Other Ambulatory Visit: Payer: Self-pay | Admitting: Family Medicine

## 2022-07-24 DIAGNOSIS — F17210 Nicotine dependence, cigarettes, uncomplicated: Secondary | ICD-10-CM

## 2022-07-25 ENCOUNTER — Other Ambulatory Visit: Payer: Self-pay | Admitting: Family Medicine

## 2022-07-25 DIAGNOSIS — Z1231 Encounter for screening mammogram for malignant neoplasm of breast: Secondary | ICD-10-CM

## 2022-07-25 DIAGNOSIS — E2839 Other primary ovarian failure: Secondary | ICD-10-CM

## 2022-09-08 LAB — AMB RESULTS CONSOLE CBG: Glucose: 119

## 2022-09-08 NOTE — Progress Notes (Signed)
Patient provided provider locations (green pamphlet). Patient provided food pantry resources. Will need to provided information on housing as did not have the updated sheet available.

## 2022-09-17 ENCOUNTER — Encounter: Payer: Self-pay | Admitting: *Deleted

## 2022-09-17 NOTE — Progress Notes (Unsigned)
Pt attended 09/08/22 screening event where her screening results were wnl.  At event, pt noted she had no PCP and identified food and housing insecurities for which she was given respective resource flyers any SDOH insecurities. Chart review indicates PCP listed as Dr. Mikael Spray, whose notes who not appear in [facility]. Pt called to determine current PCP status/needs.

## 2022-10-02 ENCOUNTER — Inpatient Hospital Stay (HOSPITAL_COMMUNITY)
Admission: EM | Admit: 2022-10-02 | Discharge: 2022-10-11 | DRG: 190 | Disposition: A | Discharge status: Home Health | Payer: Medicare HMO | Attending: Internal Medicine | Admitting: Internal Medicine

## 2022-10-02 ENCOUNTER — Emergency Department (HOSPITAL_COMMUNITY): Payer: Medicare HMO

## 2022-10-02 ENCOUNTER — Other Ambulatory Visit: Payer: Self-pay

## 2022-10-02 ENCOUNTER — Encounter (HOSPITAL_COMMUNITY): Payer: Self-pay

## 2022-10-02 DIAGNOSIS — Z801 Family history of malignant neoplasm of trachea, bronchus and lung: Secondary | ICD-10-CM | POA: Diagnosis not present

## 2022-10-02 DIAGNOSIS — Z90710 Acquired absence of both cervix and uterus: Secondary | ICD-10-CM

## 2022-10-02 DIAGNOSIS — Z8249 Family history of ischemic heart disease and other diseases of the circulatory system: Secondary | ICD-10-CM

## 2022-10-02 DIAGNOSIS — F39 Unspecified mood [affective] disorder: Secondary | ICD-10-CM | POA: Diagnosis not present

## 2022-10-02 DIAGNOSIS — J9621 Acute and chronic respiratory failure with hypoxia: Principal | ICD-10-CM

## 2022-10-02 DIAGNOSIS — Z7189 Other specified counseling: Secondary | ICD-10-CM | POA: Diagnosis not present

## 2022-10-02 DIAGNOSIS — J441 Chronic obstructive pulmonary disease with (acute) exacerbation: Principal | ICD-10-CM | POA: Diagnosis present

## 2022-10-02 DIAGNOSIS — J383 Other diseases of vocal cords: Secondary | ICD-10-CM

## 2022-10-02 DIAGNOSIS — Z96642 Presence of left artificial hip joint: Secondary | ICD-10-CM | POA: Diagnosis present

## 2022-10-02 DIAGNOSIS — Z681 Body mass index (BMI) 19 or less, adult: Secondary | ICD-10-CM | POA: Diagnosis not present

## 2022-10-02 DIAGNOSIS — G894 Chronic pain syndrome: Secondary | ICD-10-CM | POA: Diagnosis present

## 2022-10-02 DIAGNOSIS — J9602 Acute respiratory failure with hypercapnia: Secondary | ICD-10-CM | POA: Diagnosis not present

## 2022-10-02 DIAGNOSIS — E876 Hypokalemia: Secondary | ICD-10-CM | POA: Diagnosis present

## 2022-10-02 DIAGNOSIS — E43 Unspecified severe protein-calorie malnutrition: Secondary | ICD-10-CM | POA: Insufficient documentation

## 2022-10-02 DIAGNOSIS — Z79899 Other long term (current) drug therapy: Secondary | ICD-10-CM

## 2022-10-02 DIAGNOSIS — R269 Unspecified abnormalities of gait and mobility: Secondary | ICD-10-CM | POA: Diagnosis present

## 2022-10-02 DIAGNOSIS — K219 Gastro-esophageal reflux disease without esophagitis: Secondary | ICD-10-CM | POA: Diagnosis present

## 2022-10-02 DIAGNOSIS — F419 Anxiety disorder, unspecified: Secondary | ICD-10-CM | POA: Diagnosis present

## 2022-10-02 DIAGNOSIS — F17210 Nicotine dependence, cigarettes, uncomplicated: Secondary | ICD-10-CM | POA: Diagnosis present

## 2022-10-02 DIAGNOSIS — Z7951 Long term (current) use of inhaled steroids: Secondary | ICD-10-CM | POA: Diagnosis not present

## 2022-10-02 DIAGNOSIS — I10 Essential (primary) hypertension: Secondary | ICD-10-CM | POA: Diagnosis not present

## 2022-10-02 DIAGNOSIS — Z515 Encounter for palliative care: Secondary | ICD-10-CM | POA: Diagnosis not present

## 2022-10-02 DIAGNOSIS — J9622 Acute and chronic respiratory failure with hypercapnia: Secondary | ICD-10-CM | POA: Diagnosis not present

## 2022-10-02 DIAGNOSIS — J439 Emphysema, unspecified: Secondary | ICD-10-CM | POA: Diagnosis not present

## 2022-10-02 LAB — CBC
HCT: 37.9 % (ref 36.0–46.0)
Hemoglobin: 11.7 g/dL — ABNORMAL LOW (ref 12.0–15.0)
MCH: 29.5 pg (ref 26.0–34.0)
MCHC: 30.9 g/dL (ref 30.0–36.0)
MCV: 95.5 fL (ref 80.0–100.0)
Platelets: 230 10*3/uL (ref 150–400)
RBC: 3.97 MIL/uL (ref 3.87–5.11)
RDW: 14 % (ref 11.5–15.5)
WBC: 8.2 10*3/uL (ref 4.0–10.5)
nRBC: 0 % (ref 0.0–0.2)

## 2022-10-02 LAB — MAGNESIUM
Magnesium: 10.4 mg/dL (ref 1.7–2.4)
Magnesium: 2.3 mg/dL (ref 1.7–2.4)

## 2022-10-02 LAB — BASIC METABOLIC PANEL
Anion gap: 10 (ref 5–15)
BUN: 9 mg/dL (ref 8–23)
CO2: 29 mmol/L (ref 22–32)
Calcium: 8.1 mg/dL — ABNORMAL LOW (ref 8.9–10.3)
Chloride: 96 mmol/L — ABNORMAL LOW (ref 98–111)
Creatinine, Ser: 0.58 mg/dL (ref 0.44–1.00)
GFR, Estimated: >60 mL/min (ref >60)
Glucose, Bld: 153 mg/dL — ABNORMAL HIGH (ref 70–99)
Potassium: 2.6 mmol/L — CL (ref 3.5–5.1)
Sodium: 135 mmol/L (ref 135–145)

## 2022-10-02 LAB — TROPONIN I (HIGH SENSITIVITY)
Troponin I (High Sensitivity): 5 ng/L (ref <18)
Troponin I (High Sensitivity): 6 ng/L (ref <18)

## 2022-10-02 MED ORDER — ALBUTEROL SULFATE (2.5 MG/3ML) 0.083% IN NEBU
2.5000 mg | INHALATION_SOLUTION | RESPIRATORY_TRACT | Status: DC | PRN
  Administered 2022-10-03 – 2022-10-10 (×13): 2.5 mg via RESPIRATORY_TRACT
  Filled 2022-10-02 (×15): qty 3

## 2022-10-02 MED ORDER — SODIUM CHLORIDE 0.9 % IV SOLN
1.0000 g | INTRAVENOUS | Status: AC
  Administered 2022-10-03 – 2022-10-07 (×5): 1 g via INTRAVENOUS
  Filled 2022-10-02 (×5): qty 10

## 2022-10-02 MED ORDER — IPRATROPIUM-ALBUTEROL 0.5-2.5 (3) MG/3ML IN SOLN
3.0000 mL | Freq: Four times a day (QID) | RESPIRATORY_TRACT | Status: DC
  Administered 2022-10-03 – 2022-10-05 (×10): 3 mL via RESPIRATORY_TRACT
  Filled 2022-10-02 (×10): qty 3

## 2022-10-02 MED ORDER — POTASSIUM CHLORIDE 10 MEQ/100ML IV SOLN
10.0000 meq | INTRAVENOUS | Status: AC
  Administered 2022-10-02 (×3): 10 meq via INTRAVENOUS
  Filled 2022-10-02 (×3): qty 100

## 2022-10-02 MED ORDER — ENOXAPARIN SODIUM 40 MG/0.4ML IJ SOSY
40.0000 mg | PREFILLED_SYRINGE | INTRAMUSCULAR | Status: DC
  Administered 2022-10-03: 40 mg via SUBCUTANEOUS
  Filled 2022-10-02: qty 0.4

## 2022-10-02 MED ORDER — MORPHINE SULFATE (PF) 2 MG/ML IV SOLN
2.0000 mg | Freq: Once | INTRAVENOUS | Status: AC
  Administered 2022-10-02: 2 mg via INTRAVENOUS
  Filled 2022-10-02: qty 1

## 2022-10-02 MED ORDER — PREDNISONE 20 MG PO TABS
40.0000 mg | ORAL_TABLET | Freq: Every day | ORAL | Status: DC
  Administered 2022-10-04: 40 mg via ORAL
  Filled 2022-10-02: qty 2

## 2022-10-02 MED ORDER — METHYLPREDNISOLONE SODIUM SUCC 125 MG IJ SOLR
125.0000 mg | Freq: Two times a day (BID) | INTRAMUSCULAR | Status: AC
  Administered 2022-10-03 (×2): 125 mg via INTRAVENOUS
  Filled 2022-10-02 (×2): qty 2

## 2022-10-02 MED ORDER — ALBUTEROL SULFATE (2.5 MG/3ML) 0.083% IN NEBU
10.0000 mg/h | INHALATION_SOLUTION | Freq: Once | RESPIRATORY_TRACT | Status: AC
  Administered 2022-10-02: 10 mg/h via RESPIRATORY_TRACT
  Filled 2022-10-02: qty 12

## 2022-10-02 MED ORDER — POTASSIUM CHLORIDE IN NACL 40-0.9 MEQ/L-% IV SOLN
INTRAVENOUS | Status: DC
  Filled 2022-10-02 (×2): qty 1000

## 2022-10-02 MED ORDER — FENTANYL CITRATE PF 50 MCG/ML IJ SOSY
25.0000 ug | PREFILLED_SYRINGE | Freq: Once | INTRAMUSCULAR | Status: AC
  Administered 2022-10-02: 25 ug via INTRAVENOUS
  Filled 2022-10-02: qty 1

## 2022-10-02 MED ORDER — ALBUTEROL SULFATE (2.5 MG/3ML) 0.083% IN NEBU
2.5000 mg | INHALATION_SOLUTION | RESPIRATORY_TRACT | Status: DC | PRN
  Administered 2022-10-02: 2.5 mg via RESPIRATORY_TRACT
  Filled 2022-10-02: qty 3

## 2022-10-02 NOTE — ED Notes (Signed)
Patient placed back on bipap after using bedpan, in resp distress again, tachypneic and sats dropping down to 88%. After placement of bipap patient more relaxed and not in distress. Notified RT at this time.

## 2022-10-02 NOTE — H&P (Signed)
History and Physical    Patient: [name]:[id number] DOB: September 20, 1955 DOA: 10/02/2022 DOS: the patient was seen and examined on 10/02/2022 PCP: Patient, No Pcp Per  Patient coming from: Home  Chief Complaint:  Chief Complaint  Patient presents with   Shortness of Breath   COPD   Chest Pain   HPI: [name] is a 67 y.o. female with medical history significant of COPD and asthma, hypertension, chronic pain syndrome, osteoarthritis and tobacco abuse who apparently has been having significant shortness of breath and cough over the last 2 to 3 days.  Symptoms got worse this morning.  Patient called EMS who gave her some DuoNebs and recommended coming to the hospital but patient declined.  Throughout the day the shortness of breath got worse.  EMS brought patient in.  She had 3 doses of DuoNeb and route with Solu-Medrol.  Patient is now on BiPAP in the ER.  She additionally was found to have electrolyte abnormalities including hypokalemia.  Has some chest pain and tightness.  No fever or chills and no sick contacts.  Patient is being admitted with acute on chronic respiratory failure most likely due to COPD exacerbation.  Review of Systems: As mentioned in the history of present illness. All other systems reviewed and are negative. Past Medical History:  Diagnosis Date   Arthritis    Asthma    Chest pain 2006   Chest pain 10/2016   Chronic back pain    Hypertension    Sciatica    Shortness of breath    due to  smoking    Past Surgical History:  Procedure Laterality Date   ABDOMINAL HYSTERECTOMY  1997   CARDIAC CATHETERIZATION  2006   HEMI-MICRODISCECTOMY LUMBAR LAMINECTOMY LEVEL 1  05/14/2012   Procedure: HEMI-MICRODISCECTOMY LUMBAR LAMINECTOMY LEVEL 1;  Surgeon: Jacki Cones, MD;  Location: [facility];  Service: Orthopedics;  Laterality: N/A;  Hemi Laminectomy Microdiscectomy L4 - L5 Central (X-Ray)   ingrown toenail removal     TONSILLECTOMY     TOTAL HIP ARTHROPLASTY  Left 11/05/2013   Procedure: LEFT TOTAL HIP ARTHROPLASTY;  Surgeon: Jacki Cones, MD;  Location: [facility];  Service: Orthopedics;  Laterality: Left;   Social History:  reports that she has been smoking cigarettes. She has a 3.00 pack-year smoking history. She has never used smokeless tobacco. She reports that she does not drink alcohol and does not use drugs.  No Known Allergies  Family History  Problem Relation Age of Onset   Heart attack Mother 41   Hypertension Sister    Diabetes type II Sister    Tongue cancer Brother    Heart attack Sister 73   Lung cancer Sister    Hypertension Sister     Prior to Admission medications   Medication Sig Start Date End Date Taking? Authorizing Provider  albuterol (PROVENTIL) (2.5 MG/3ML) 0.083% nebulizer solution Take 3 mLs (2.5 mg total) by nebulization every 6 (six) hours as needed for wheezing or shortness of breath. 06/10/21   [name], PA-C  albuterol (VENTOLIN HFA) 108 (90 Base) MCG/ACT inhaler Inhale 2 puffs into the lungs every 4 (four) hours as needed for wheezing or shortness of breath. 06/10/21   [name], PA-C  enalapril-hydrochlorothiazide (VASERETIC) 10-25 MG tablet TAKE 1 TABLET BY MOUTH DAILY 12/08/21   Julieanne Manson, MD  fluticasone-salmeterol (ADVAIR DISKUS) 250-50 MCG/ACT AEPB 1 inhalation twice daily followed by cleaning teeth and tongue Patient not taking: Reported on 06/02/2022  06/16/21   Julieanne Manson, MD  lidocaine (LIDODERM) 5 % Place 1 patch onto the skin daily. Remove & Discard patch within 12 hours or as directed by MD Patient not taking: Reported on 06/02/2022 12/01/21   Achille Rich, PA-C  Multiple Vitamin (MULTIVITAMIN) capsule Take 1 capsule by mouth daily.    [provider]  omeprazole (PRILOSEC) 40 MG capsule 1 cap by mouth in morning daily 1/2 hour before breakfast or other meds. Patient taking differently: Take 40 mg by mouth daily as needed (for acid reflux). 06/16/21    Julieanne Manson, MD  oxyCODONE-acetaminophen (PERCOCET) 10-325 MG tablet Take 1 tablet by mouth every 4 (four) hours as needed for pain. 12/23/20   [provider]  zolpidem (AMBIEN) 5 MG tablet Take 5 mg by mouth at bedtime. 12/25/20   [provider]    Physical Exam: Vitals:   10/02/22 1843 10/02/22 1915 10/02/22 1930 10/02/22 2130  BP:  102/81 128/73 97/60  Pulse:   93 89  Resp:  20 (!) 22 20  Temp:      SpO2:   100% 100%  Weight: 44.5 kg     Height: 5\' 1"  (1.549 m)      Constitutional: NAD, calm, comfortable Eyes: PERRL, lids and conjunctivae normal ENMT: Mucous membranes are moist. Posterior pharynx clear of any exudate or lesions.Normal dentition.  Neck: normal, supple, no masses, no thyromegaly Respiratory: Decreased air entry bilaterally with marked expiratory wheezing, increased respiratory effort, No accessory muscle use.  Cardiovascular: Regular rate and rhythm, no murmurs / rubs / gallops. No extremity edema. 2+ pedal pulses. No carotid bruits.  Abdomen: no tenderness, no masses palpated. No hepatosplenomegaly. Bowel sounds positive.  Musculoskeletal: Good range of motion, no joint swelling or tenderness, Skin: no rashes, lesions, ulcers. No induration Neurologic: CN 2-12 grossly intact. Sensation intact, DTR normal. Strength 5/5 in all 4.  Psychiatric: Normal judgment and insight. Alert and oriented x 3.  Anxious mood  Data Reviewed:  Temperature is normal.  98.3.  Blood pressure 92/61, pulse 93 respiratory 22 oxygen sat 100% on 4 L.  White count is 8.2, potassium 2.6, hemoglobin 11.7, glucose 153 creatinine 0.58 calcium 8.1.  Magnesium was 2.3.  Troponin is flat.  Assessment and Plan:  #1 acute respiratory failure with hypoxemia: Secondary to COPD exacerbation.  Patient currently on BiPAP.  Will start titrating off BiPAP to nasal cannula when she can tolerate.  Admit to progressive bed.  #2 acute exacerbation of COPD: Initiate steroid, nebulizer  treatment, continue oxygen, antibiotics.  #3 hypokalemia: Continue to replete potassium.  #4 chronic pain syndrome: Continue chronic home regimen.  #5 anxiety status: We will treat as such.   Advance Care Planning:   Code Status: Full Code   Consults: None  Family Communication: No family at bedside  Severity of Illness: The appropriate patient status for this patient is INPATIENT. Inpatient status is judged to be reasonable and necessary in order to provide the required intensity of service to ensure the patient's safety. The patient's presenting symptoms, physical exam findings, and initial radiographic and laboratory data in the context of their chronic comorbidities is felt to place them at high risk for further clinical deterioration. Furthermore, it is not anticipated that the patient will be medically stable for discharge from the hospital within 2 midnights of admission.   * I certify that at the point of admission it is my clinical judgment that the patient will require inpatient hospital care spanning beyond 2 midnights from  the point of admission due to high intensity of service, high risk for further deterioration and high frequency of surveillance required.*  AuthorLonia Blood, MD 10/02/2022 10:16 PM  For on call review [email or web address].

## 2022-10-02 NOTE — ED Provider Notes (Signed)
Emergency Department Provider Note   I have reviewed the triage vital signs and the nursing notes.   HISTORY  Chief Complaint Shortness of Breath, COPD, and Chest Pain   HPI [name] is a 67 y.o. female past history of hypertension, asthma/COPD presents emergency department with worsening shortness of breath over the past week.  She has had especially bad symptoms over the past 2 to 3 days.  She actually called EMS at 6 AM today and received a DuoNeb but ultimately refused transport to the hospital at that time.  She initially felt somewhat better but has had increased shortness of breath over the past 12 hours to the point where she called EMS once again.  They found her to be very wheezy and coughing.  She was given 3 DuoNebs and route along with Solu-Medrol and magnesium.  She does describe some chest tightness consistent with her COPD.  No vomiting or fever/chills.   Past Medical History:  Diagnosis Date   Arthritis    Asthma    Chest pain 2006   Chest pain 10/2016   Chronic back pain    Hypertension    Sciatica    Shortness of breath    due to  smoking     Review of Systems  Constitutional: No fever/chills Eyes: No visual changes. ENT: No sore throat. Cardiovascular: Positive chest pain. Respiratory: Positive shortness of breath. Gastrointestinal: No abdominal pain.  No nausea, no vomiting.   Genitourinary: Negative for dysuria. Musculoskeletal: Negative for back pain. Skin: Negative for rash. Neurological: Negative for headaches.   ____________________________________________   PHYSICAL EXAM:  VITAL SIGNS: ED Triage Vitals  Enc Vitals Group     BP 10/02/22 1840 127/68     Pulse Rate 10/02/22 1840 93     Resp 10/02/22 1840 17     Temp 10/02/22 1840 98.3 F (36.8 C)     Temp src --      SpO2 10/02/22 1840 100 %     Weight 10/02/22 1843 98 lb (44.5 kg)     Height 10/02/22 1843 5\' 1"  (1.549 m)   Constitutional: Alert and oriented. Increased WOB  but able to provide a history.  Eyes: Conjunctivae are normal.  Head: Atraumatic. Nose: No congestion/rhinnorhea. Mouth/Throat: Mucous membranes are moist.  Neck: No stridor.  Cardiovascular: Normal rate, regular rhythm. Good peripheral circulation. Grossly normal heart sounds.   Respiratory: Increased respiratory effort.  No retractions. Lungs with end-expiratory wheezing bilaterally with diminished air entry.  Gastrointestinal: Soft and nontender. No distention.  Musculoskeletal: No gross deformities of extremities. Neurologic:  Normal speech and language.  Skin:  Skin is warm, dry and intact. No rash noted.   ____________________________________________   LABS (all labs ordered are listed, but only abnormal results are displayed)  Labs Reviewed  CBC - Abnormal; Notable for the following components:      Result Value   Hemoglobin 11.7 (*)    All other components within normal limits  BASIC METABOLIC PANEL  TROPONIN I (HIGH SENSITIVITY)   ____________________________________________  EKG   EKG Interpretation  Date/Time:  Tuesday Oct 02 2022 18:42:18 EDT Ventricular Rate:  92 PR Interval:  160 QRS Duration: 90 QT Interval:  332 QTC Calculation: 418 R Axis:   90 Text Interpretation: Sinus rhythm Atrial premature complex Borderline right axis deviation Consider left ventricular hypertrophy Anterior Q waves, possibly due to LVH Nonspecific T abnormalities, inferior leads Confirmed by Alona Bene [phone]) on 10/02/2022 6:55:19 PM  ____________________________________________  RADIOLOGY  DG Chest Port 1 View  Result Date: 10/02/2022 CLINICAL DATA:  Shortness of breath. Wheezing. EXAM: PORTABLE CHEST 1 VIEW COMPARISON:  Radiograph and CT 12/01/2021 FINDINGS: Normal heart size. Stable mediastinal contours. Moderate to advanced emphysema with chronic hyperinflation. There is increased bronchial thickening from prior exam. Blunting of the costophrenic angles typically  due to hyperinflation rather than pleural effusions. No focal airspace disease. Pneumothorax. No acute osseous abnormalities are seen. IMPRESSION: 1. Increased bronchial thickening from prior exam, may be acute bronchitis or COPD exacerbation. 2. Moderate to advanced emphysema. Electronically Signed   By: Narda Rutherford M.D.   On: 10/02/2022 19:40    ____________________________________________   PROCEDURES  Procedure(s) performed:   Procedures  CRITICAL CARE Performed by: Maia Plan Total critical care time: *** minutes Critical care time was exclusive of separately billable procedures and treating other patients. Critical care was necessary to treat or prevent imminent or life-threatening deterioration. Critical care was time spent personally by me on the following activities: development of treatment plan with patient and/or surrogate as well as nursing, discussions with consultants, evaluation of patient's response to treatment, examination of patient, obtaining history from patient or surrogate, ordering and performing treatments and interventions, ordering and review of laboratory studies, ordering and review of radiographic studies, pulse oximetry and re-evaluation of patient's condition.  Alona Bene, MD Emergency Medicine  ____________________________________________   INITIAL IMPRESSION / ASSESSMENT AND PLAN / ED COURSE  Pertinent labs & imaging results that were available during my care of the patient were reviewed by me and considered in my medical decision making (see chart for details).   This patient is Presenting for Evaluation of SOB, which does require a range of treatment options, and is a complaint that involves a high risk of morbidity and mortality.  The Differential Diagnoses includes but is not exclusive to acute coronary syndrome, aortic dissection, pulmonary embolism, cardiac tamponade, community-acquired pneumonia, pericarditis, musculoskeletal chest wall  pain, etc.   Critical Interventions-    Medications  fentaNYL (SUBLIMAZE) injection 25 mcg (has no administration in time range)  albuterol (PROVENTIL) (2.5 MG/3ML) 0.083% nebulizer solution (has no administration in time range)    Reassessment after intervention:    I did obtain Additional Historical Information from EMS.  I decided to review pertinent External Data, and in summary last ED encounter was 06/03/22 for unrelated event.   Clinical Laboratory Tests Ordered, included CBC without leukocytosis. ***  Radiologic Tests Ordered, included CXR. I independently interpreted the images and agree with radiology interpretation.   Cardiac Monitor Tracing which shows NSR.    Social Determinants of Health Risk patient with a smoking history.   Consult complete with  Medical Decision Making: Summary:  Patient presents to the emergency department for evaluation of shortness of breath.  She is very tight and wheezing on exam.  Does appear in some moderate distress.  Plan to transition her to BiPAP with continuous albuterol.  Reevaluation with update and discussion with   ***Considered admission***  Patient's presentation is most consistent with acute presentation with potential threat to life or bodily function.   Disposition:   ____________________________________________  FINAL CLINICAL IMPRESSION(S) / ED DIAGNOSES  Final diagnoses:  None     NEW OUTPATIENT MEDICATIONS STARTED DURING THIS VISIT:  New Prescriptions   No medications on file    Note:  This document was prepared using Dragon voice recognition software and may include unintentional dictation errors.  Alona Bene, MD, [facility] Emergency Medicine

## 2022-10-02 NOTE — Progress Notes (Signed)
Pt placed on bipap per MD order.  

## 2022-10-02 NOTE — ED Triage Notes (Signed)
SOB x a few days. EMS went to her house at 6am and gave a duo neb. Pt did not come to the hospital at  that time. Pt called EMS back this afternoon due to worsening SOB. Pt has PCP appt tomorrow. Per EMS wheezing and rhonchi on the left. Coughing. HX of COPD  3 duo nebs in route (15mg  albuterol, iprat) Solu-Medrol 125mg  Mag 2g  20RAC  90% RA oxygen 136/70 BP 90 HR

## 2022-10-03 DIAGNOSIS — J441 Chronic obstructive pulmonary disease with (acute) exacerbation: Secondary | ICD-10-CM | POA: Diagnosis not present

## 2022-10-03 LAB — COMPREHENSIVE METABOLIC PANEL
ALT: 11 U/L (ref 0–44)
AST: 18 U/L (ref 15–41)
Albumin: 3.4 g/dL — ABNORMAL LOW (ref 3.5–5.0)
Alkaline Phosphatase: 44 U/L (ref 38–126)
Anion gap: 8 (ref 5–15)
BUN: 9 mg/dL (ref 8–23)
CO2: 30 mmol/L (ref 22–32)
Calcium: 8.2 mg/dL — ABNORMAL LOW (ref 8.9–10.3)
Chloride: 97 mmol/L — ABNORMAL LOW (ref 98–111)
Creatinine, Ser: 0.53 mg/dL (ref 0.44–1.00)
GFR, Estimated: >60 mL/min (ref >60)
Glucose, Bld: 174 mg/dL — ABNORMAL HIGH (ref 70–99)
Potassium: 2.9 mmol/L — ABNORMAL LOW (ref 3.5–5.1)
Sodium: 135 mmol/L (ref 135–145)
Total Bilirubin: 0.4 mg/dL (ref 0.3–1.2)
Total Protein: 6.3 g/dL — ABNORMAL LOW (ref 6.5–8.1)

## 2022-10-03 LAB — CBC
HCT: 35 % — ABNORMAL LOW (ref 36.0–46.0)
HCT: 33.3 % — ABNORMAL LOW (ref 36.0–46.0)
Hemoglobin: 11 g/dL — ABNORMAL LOW (ref 12.0–15.0)
Hemoglobin: 10.4 g/dL — ABNORMAL LOW (ref 12.0–15.0)
MCH: 29.7 pg (ref 26.0–34.0)
MCH: 29.5 pg (ref 26.0–34.0)
MCHC: 31.4 g/dL (ref 30.0–36.0)
MCHC: 31.2 g/dL (ref 30.0–36.0)
MCV: 94.6 fL (ref 80.0–100.0)
MCV: 94.3 fL (ref 80.0–100.0)
Platelets: 228 10*3/uL (ref 150–400)
Platelets: 241 10*3/uL (ref 150–400)
RBC: 3.7 MIL/uL — ABNORMAL LOW (ref 3.87–5.11)
RBC: 3.53 MIL/uL — ABNORMAL LOW (ref 3.87–5.11)
RDW: 13.9 % (ref 11.5–15.5)
RDW: 14 % (ref 11.5–15.5)
WBC: 5.9 10*3/uL (ref 4.0–10.5)
WBC: 3.8 10*3/uL — ABNORMAL LOW (ref 4.0–10.5)
nRBC: 0 % (ref 0.0–0.2)
nRBC: 0 % (ref 0.0–0.2)

## 2022-10-03 LAB — CREATININE, SERUM
Creatinine, Ser: 0.6 mg/dL (ref 0.44–1.00)
GFR, Estimated: >60 mL/min (ref >60)

## 2022-10-03 LAB — HIV ANTIBODY (ROUTINE TESTING W REFLEX): HIV Screen 4th Generation wRfx: NONREACTIVE

## 2022-10-03 MED ORDER — ZOLPIDEM TARTRATE 5 MG PO TABS
5.0000 mg | ORAL_TABLET | Freq: Every evening | ORAL | Status: DC | PRN
  Administered 2022-10-03 – 2022-10-05 (×2): 5 mg via ORAL
  Filled 2022-10-03 (×2): qty 1

## 2022-10-03 MED ORDER — POTASSIUM CHLORIDE CRYS ER 20 MEQ PO TBCR
30.0000 meq | EXTENDED_RELEASE_TABLET | ORAL | Status: AC
  Administered 2022-10-03 (×3): 30 meq via ORAL
  Filled 2022-10-03 (×3): qty 1

## 2022-10-03 MED ORDER — ZOLPIDEM TARTRATE 5 MG PO TABS
2.5000 mg | ORAL_TABLET | ORAL | Status: AC
  Administered 2022-10-03: 2.5 mg via ORAL
  Filled 2022-10-03: qty 1

## 2022-10-03 MED ORDER — ARFORMOTEROL TARTRATE 15 MCG/2ML IN NEBU: 15.0000 ug | INHALATION_SOLUTION | Freq: Two times a day (BID) | RESPIRATORY_TRACT | Status: DC

## 2022-10-03 MED ORDER — OXYCODONE HCL 5 MG PO TABS
5.0000 mg | ORAL_TABLET | Freq: Four times a day (QID) | ORAL | Status: DC | PRN
  Administered 2022-10-03 – 2022-10-11 (×27): 5 mg via ORAL
  Filled 2022-10-03 (×31): qty 1

## 2022-10-03 MED ORDER — SALINE SPRAY 0.65 % NA SOLN: 1.0000 | NASAL | Status: DC | PRN

## 2022-10-03 MED ORDER — ARFORMOTEROL TARTRATE 15 MCG/2ML IN NEBU
15.0000 ug | INHALATION_SOLUTION | Freq: Two times a day (BID) | RESPIRATORY_TRACT | Status: DC
  Administered 2022-10-03 – 2022-10-11 (×16): 15 ug via RESPIRATORY_TRACT
  Filled 2022-10-03 (×16): qty 2

## 2022-10-03 MED ORDER — BUDESONIDE 0.5 MG/2ML IN SUSP: 0.5000 mg | Freq: Two times a day (BID) | RESPIRATORY_TRACT | Status: DC

## 2022-10-03 MED ORDER — ENOXAPARIN SODIUM 30 MG/0.3ML IJ SOSY
30.0000 mg | PREFILLED_SYRINGE | INTRAMUSCULAR | Status: DC
  Administered 2022-10-04 – 2022-10-10 (×7): 30 mg via SUBCUTANEOUS
  Filled 2022-10-03 (×7): qty 0.3

## 2022-10-03 MED ORDER — PANTOPRAZOLE SODIUM 40 MG PO TBEC
80.0000 mg | DELAYED_RELEASE_TABLET | Freq: Every day | ORAL | Status: DC | PRN
  Administered 2022-10-09: 80 mg via ORAL
  Filled 2022-10-03: qty 2

## 2022-10-03 MED ORDER — OXYCODONE-ACETAMINOPHEN 5-325 MG PO TABS
1.0000 | ORAL_TABLET | Freq: Four times a day (QID) | ORAL | Status: DC | PRN
  Administered 2022-10-03 – 2022-10-11 (×26): 1 via ORAL
  Filled 2022-10-03 (×30): qty 1

## 2022-10-03 MED ORDER — BUDESONIDE 0.5 MG/2ML IN SUSP
0.5000 mg | Freq: Two times a day (BID) | RESPIRATORY_TRACT | Status: DC
  Administered 2022-10-03 – 2022-10-11 (×16): 0.5 mg via RESPIRATORY_TRACT
  Filled 2022-10-03 (×16): qty 2

## 2022-10-03 MED ORDER — OXYCODONE-ACETAMINOPHEN 10-325 MG PO TABS: 1.0000 | ORAL_TABLET | Freq: Four times a day (QID) | ORAL | Status: DC | PRN

## 2022-10-03 NOTE — Evaluation (Signed)
Physical Therapy Evaluation Patient Details Name: [name] MRN: [id number] DOB: [birth year] Today's Date: 10/03/2022  History of Present Illness  Pt is 67 yo female who was admitted to the hospital on 10/02/22 with shortness of breath and cough. She was found to have COPD exacerbation. PMH: COPD, asthma, HTN, chronic pain, OA, sciatica, THA  Clinical Impression   Pt admitted with above diagnosis. At baseline, pt reports independent.  She reports that she wants to mobilize on her own and not use AD.  Does admit to holding walls and furniture at times.  Today, pt requiring min guard for ambulation for safety.  She did drift R/L in hallway but did not want to try AD.  Pt could benefit from further gait training for AD use to decrease fall risk.  Pt also on 2 L with sats 90% or >,.  Did not attempt RA as pt's sats dropped earlier with OT.  Suspect pt not far from her baseline, but will benefit from acute PT to progress and improve safety while hospitalized.  Pt currently with functional limitations due to the deficits listed below (see PT Problem List). Pt will benefit from acute skilled PT to increase their independence and safety with mobility to allow discharge.          Recommendations for follow up therapy are one component of a multi-disciplinary discharge planning process, led by the attending physician.  Recommendations may be updated based on patient status, additional functional criteria and insurance authorization.  Follow Up Recommendations       Assistance Recommended at Discharge Intermittent Supervision/Assistance  Patient can return home with the following  Assistance with cooking/housework;Help with stairs or ramp for entrance    Equipment Recommendations None recommended by PT  Recommendations for Other Services       Functional Status Assessment Patient has had a recent decline in their functional status and demonstrates the ability to make significant improvements in  function in a reasonable and predictable amount of time.     Precautions / Restrictions Precautions Precautions: Fall      Mobility  Bed Mobility Overal bed mobility: Needs Assistance Bed Mobility: Supine to Sit, Sit to Supine     Supine to sit: Supervision Sit to supine: Supervision        Transfers Overall transfer level: Needs assistance Equipment used: None Transfers: Sit to/from Stand Sit to Stand: Supervision                Ambulation/Gait Ambulation/Gait assistance: Min guard, Min assist Gait Distance (Feet): 180 Feet Assistive device: None Gait Pattern/deviations: Step-through pattern, Decreased stride length, Staggering right, Staggering left Gait velocity: decreased     General Gait Details: Pt declining AD use - reports she has to stay strong and doesn't need.  She started with very slow gait and speed varied throughout.  Pt frequently drifting R/L  and typically recovering on her own but did need min A once and handrail twice. Does admit she grabs furniture/walls at home at times. Discussed may need AD (cane) use at home for safety  Stairs            Wheelchair Mobility    Modified Rankin (Stroke Patients Only)       Balance Overall balance assessment: Needs assistance Sitting-balance support: No upper extremity supported Sitting balance-Leahy Scale: Good     Standing balance support: No upper extremity supported, Single extremity supported Standing balance-Leahy Scale: Fair Standing balance comment: occasional UE use with gait ; drifting  R/L                             Pertinent Vitals/Pain Pain Assessment Pain Assessment: No/denies pain Pain Score: 0-No pain Pain Location: none    Home Living Family/patient expects to be discharged to:: Private residence Living Arrangements: Alone Available Help at Discharge: Family Type of Home: Other(Comment) (duplex) Home Access: Level entry       Home Layout: One  level Home Equipment: Cane - single point      Prior Function Prior Level of Function : Independent/Modified Independent             Mobility Comments: She was independent with ambulation. ADLs Comments: She reported being independent with ADLs, cooking, cleaning, and driving.     Hand Dominance   Dominant Hand: Right    Extremity/Trunk Assessment   Upper Extremity Assessment Upper Extremity Assessment: Overall WFL for tasks assessed    Lower Extremity Assessment Lower Extremity Assessment: Overall WFL for tasks assessed    Cervical / Trunk Assessment Cervical / Trunk Assessment: Normal  Communication   Communication: No difficulties  Cognition Arousal/Alertness: Awake/alert Behavior During Therapy: WFL for tasks assessed/performed Overall Cognitive Status: No family/caregiver present to determine baseline cognitive functioning Area of Impairment: Safety/judgement                               General Comments: Oriented x4, able to follow commands, required intermittent cues for safety awareness, decreased insight into deficits (suspect baseline)        General Comments General comments (skin integrity, edema, etc.): Pt on 2 L O2 with sats 94% rest and 90% activity.  Noted dropped with OT earlier so did not attempt RA> BP supine 107/75 BP sitting 121//66   Exercises     Assessment/Plan    PT Assessment Patient needs continued PT services  PT Problem List Cardiopulmonary status limiting activity;Decreased activity tolerance;Decreased balance;Decreased mobility;Decreased safety awareness       PT Treatment Interventions DME instruction;Therapeutic exercise;Gait training;Balance training;Stair training;Functional mobility training;Therapeutic activities;Patient/family education;Neuromuscular re-education    PT Goals (Current goals can be found in the Care Plan section)  Acute Rehab PT Goals Patient Stated Goal: return home PT Goal Formulation:  With patient Time For Goal Achievement: 10/17/22 Potential to Achieve Goals: Good    Frequency Min 1X/week     Co-evaluation               AM-PAC PT "6 Clicks" Mobility  Outcome Measure Help needed turning from your back to your side while in a flat bed without using bedrails?: None Help needed moving from lying on your back to sitting on the side of a flat bed without using bedrails?: None Help needed moving to and from a bed to a chair (including a wheelchair)?: A Little Help needed standing up from a chair using your arms (e.g., wheelchair or bedside chair)?: A Little Help needed to walk in hospital room?: A Little Help needed climbing 3-5 steps with a railing? : A Little 6 Click Score: 20    End of Session Equipment Utilized During Treatment: Gait belt Activity Tolerance: Patient tolerated treatment well Patient left: with chair alarm set;in chair;with call bell/phone within reach Nurse Communication: Mobility status PT Visit Diagnosis: Other abnormalities of gait and mobility (R26.89)    Time: 4098-1191 PT Time Calculation (min) (ACUTE ONLY): 20 min   Charges:  PT Evaluation $PT Eval Low Complexity: 1 Low          Callan Yontz, PT Acute Rehab [facility] [phone]   Rayetta Humphrey 10/03/2022, 5:52 PM

## 2022-10-03 NOTE — ED Notes (Signed)
ED TO INPATIENT HANDOFF REPORT  Name/Age/Gender [name] 67 y.o. female  Code Status    Code Status Orders  (From admission, onward)           Start     Ordered   10/02/22 2215  Full code  Continuous       Question:  By:  Answer:  Consent: discussion documented in EHR   10/02/22 2216           Code Status History     Date Active Date Inactive Code Status Order ID Comments User Context   06/02/2022 2027 06/03/2022 1650 Full Code [id number]  Gerhard Munch, MD ED   11/07/2016 1353 11/07/2016 2308 Full Code [id number]  Beather Arbour, MD Inpatient   01/07/2016 1057 01/08/2016 1722 Full Code [id number]  Beather Arbour, MD ED   11/05/2013 1105 11/09/2013 1324 Full Code [id number]  Jacki Cones, MD Inpatient   05/14/2012 1306 05/16/2012 1353 Full Code [id number]  Iantha Fallen, RN Inpatient       Home/SNF/Other Home  Chief Complaint COPD with acute exacerbation (HCC) [J44.1]  Level of Care/Admitting Diagnosis ED Disposition     ED Disposition  Admit   Condition  --   Comment  Hospital Area: [facility] [[id number]]  Level of Care: Progressive [102]  Admit to Progressive based on following criteria: RESPIRATORY PROBLEMS hypoxemic/hypercapnic respiratory failure that is responsive to NIPPV (BiPAP) or High Flow Nasal Cannula (6-80 lpm). Frequent assessment/intervention, no > Q2 hrs < Q4 hrs, to maintain oxygenation and pulmonary hygiene.  May admit patient to [facility] or [facility] if equivalent level of care is available:: Yes  Covid Evaluation: Confirmed COVID Negative  Diagnosis: COPD with acute exacerbation [facility]) [[id number]]  Admitting Physician: Rometta Emery [[id number]]  Attending Physician: Rometta Emery [[id number]]  Certification:: I certify this patient will need inpatient services for at least 2 midnights  Estimated Length of Stay: 4          Medical History Past Medical History:  Diagnosis Date   Arthritis    Asthma    Chest  pain 2006   Chest pain 10/2016   Chronic back pain    Hypertension    Sciatica    Shortness of breath    due to  smoking     Allergies No Known Allergies  IV Location/Drains/Wounds Patient Lines/Drains/Airways Status     Active Line/Drains/Airways     Name Placement date Placement time Site Days   Peripheral IV 10/02/22 20 G Right Antecubital 10/02/22  1847  Antecubital  1   External Urinary Catheter 01/17/21  --  --  624            Labs/Imaging Results for orders placed or performed during the hospital encounter of 10/02/22 (from the past 48 hour(s))  Basic metabolic panel     Status: Abnormal   Collection Time: 10/02/22  6:58 PM  Result Value Ref Range   Sodium 135 135 - 145 mmol/L   Potassium 2.6 (LL) 3.5 - 5.1 mmol/L    Comment: CRITICAL RESULT CALLED TO, READ BACK BY AND VERIFIED WITH G.BONIS, RN AT 2012 ON 05.07.24 BY N.THOMPSON    Chloride 96 (L) 98 - 111 mmol/L   CO2 29 22 - 32 mmol/L   Glucose, Bld 153 (H) 70 - 99 mg/dL    Comment: Glucose reference range applies only to samples taken after fasting for at least 8 hours.   BUN 9 8 -  23 mg/dL   Creatinine, Ser 1.61 0.44 - 1.00 mg/dL   Calcium 8.1 (L) 8.9 - 10.3 mg/dL   GFR, Estimated >09 >60 mL/min    Comment: (NOTE) Calculated using the CKD-EPI Creatinine Equation (2021)    Anion gap 10 5 - 15    Comment: Performed at [facility], [address], [place], [place] [address]  CBC     Status: Abnormal   Collection Time: 10/02/22  6:58 PM  Result Value Ref Range   WBC 8.2 4.0 - 10.5 K/uL   RBC 3.97 3.87 - 5.11 MIL/uL   Hemoglobin 11.7 (L) 12.0 - 15.0 g/dL   HCT 81.1 91.4 - 78.2 %   MCV 95.5 80.0 - 100.0 fL   MCH 29.5 26.0 - 34.0 pg   MCHC 30.9 30.0 - 36.0 g/dL   RDW 95.6 21.3 - 08.6 %   Platelets 230 150 - 400 K/uL   nRBC 0.0 0.0 - 0.2 %    Comment: Performed at [facility], [address], [place], [place] [address]  Troponin I (High Sensitivity)     Status:  None   Collection Time: 10/02/22  6:58 PM  Result Value Ref Range   Troponin I (High Sensitivity) 5 <18 ng/L    Comment: (NOTE) Elevated high sensitivity troponin I (hsTnI) values and significant  changes across serial measurements may suggest ACS but many other  chronic and acute conditions are known to elevate hsTnI results.  Refer to the "Links" section for chest pain algorithms and additional  guidance. Performed at [facility], [address], [place], [place] [address]   Magnesium     Status: Abnormal   Collection Time: 10/02/22  8:20 PM  Result Value Ref Range   Magnesium 10.4 (HH) 1.7 - 2.4 mg/dL    Comment: CRITICAL RESULT CALLED TO, READ BACK BY AND VERIFIED WITH BRATU,D RN @2138  10/02/22 BY CHILDRESS,E Performed at [facility], [address], [place], [place] [address]   Troponin I (High Sensitivity)     Status: None   Collection Time: 10/02/22  9:10 PM  Result Value Ref Range   Troponin I (High Sensitivity) 6 <18 ng/L    Comment: (NOTE) Elevated high sensitivity troponin I (hsTnI) values and significant  changes across serial measurements may suggest ACS but many other  chronic and acute conditions are known to elevate hsTnI results.  Refer to the "Links" section for chest pain algorithms and additional  guidance. Performed at [facility], [address] [place], [place] [address]   Magnesium     Status: None   Collection Time: 10/02/22 11:09 PM  Result Value Ref Range   Magnesium 2.3 1.7 - 2.4 mg/dL    Comment: Performed at [facility], [address], [place], [place] [address]  HIV Antibody (routine testing w rflx)     Status: None   Collection Time: 10/03/22 12:51 AM  Result Value Ref Range   HIV Screen 4th Generation wRfx Non Reactive Non Reactive    Comment: Performed at [facility], [address], [place], [place] [address]  CBC     Status: Abnormal   Collection  Time: 10/03/22 12:51 AM  Result Value Ref Range   WBC 5.9 4.0 - 10.5 K/uL   RBC 3.70 (L) 3.87 - 5.11 MIL/uL   Hemoglobin 11.0 (L) 12.0 - 15.0 g/dL   HCT 03.4 (L) 74.2 - 59.5 %   MCV  94.6 80.0 - 100.0 fL   MCH 29.7 26.0 - 34.0 pg   MCHC 31.4 30.0 - 36.0 g/dL   RDW 91.4 78.2 - 95.6 %   Platelets 228 150 - 400 K/uL   nRBC 0.0 0.0 - 0.2 %    Comment: Performed at [facility], [address], [place], [place] [address]  Creatinine, serum     Status: None   Collection Time: 10/03/22 12:51 AM  Result Value Ref Range   Creatinine, Ser 0.60 0.44 - 1.00 mg/dL   GFR, Estimated >65 >78 mL/min    Comment: (NOTE) Calculated using the CKD-EPI Creatinine Equation (2021) Performed at [facility], [address] [place], [place] [address]   Comprehensive metabolic panel     Status: Abnormal   Collection Time: 10/03/22  5:33 AM  Result Value Ref Range   Sodium 135 135 - 145 mmol/L   Potassium 2.9 (L) 3.5 - 5.1 mmol/L   Chloride 97 (L) 98 - 111 mmol/L   CO2 30 22 - 32 mmol/L   Glucose, Bld 174 (H) 70 - 99 mg/dL    Comment: Glucose reference range applies only to samples taken after fasting for at least 8 hours.   BUN 9 8 - 23 mg/dL   Creatinine, Ser 9.52 0.44 - 1.00 mg/dL   Calcium 8.2 (L) 8.9 - 10.3 mg/dL   Total Protein 6.3 (L) 6.5 - 8.1 g/dL   Albumin 3.4 (L) 3.5 - 5.0 g/dL   AST 18 15 - 41 U/L   ALT 11 0 - 44 U/L   Alkaline Phosphatase 44 38 - 126 U/L   Total Bilirubin 0.4 0.3 - 1.2 mg/dL   GFR, Estimated >84 >13 mL/min    Comment: (NOTE) Calculated using the CKD-EPI Creatinine Equation (2021)    Anion gap 8 5 - 15    Comment: Performed at [facility], [address], [place], [place] [address]  CBC     Status: Abnormal   Collection Time: 10/03/22  5:33 AM  Result Value Ref Range   WBC 3.8 (L) 4.0 - 10.5 K/uL   RBC 3.53 (L) 3.87 - 5.11 MIL/uL   Hemoglobin 10.4 (L) 12.0 - 15.0 g/dL   HCT 02.7 (L) 25.3 - 66.4 %   MCV  94.3 80.0 - 100.0 fL   MCH 29.5 26.0 - 34.0 pg   MCHC 31.2 30.0 - 36.0 g/dL   RDW 40.3 47.4 - 25.9 %   Platelets 241 150 - 400 K/uL   nRBC 0.0 0.0 - 0.2 %    Comment: Performed at [facility], [address], [place], [place] [address]   DG Chest Port 1 View  Result Date: 10/02/2022 CLINICAL DATA:  Shortness of breath. Wheezing. EXAM: PORTABLE CHEST 1 VIEW COMPARISON:  Radiograph and CT 12/01/2021 FINDINGS: Normal heart size. Stable mediastinal contours. Moderate to advanced emphysema with chronic hyperinflation. There is increased bronchial thickening from prior exam. Blunting of the costophrenic angles typically due to hyperinflation rather than pleural effusions. No focal airspace disease. Pneumothorax. No acute osseous abnormalities are seen. IMPRESSION: 1. Increased bronchial thickening from prior exam, may be acute bronchitis or COPD exacerbation. 2. Moderate to advanced emphysema. Electronically Signed   By: Narda Rutherford M.D.   On: 10/02/2022 19:40    Pending Labs Unresulted Labs (From admission, onward)     Start     Ordered   10/09/22 0500  Creatinine, serum  (enoxaparin (LOVENOX)  CrCl >/= 30 ml/min)  Weekly,   R     Comments: while on enoxaparin therapy    10/02/22 2345            Vitals/Pain Today's Vitals   10/03/22 0758 10/03/22 1031 10/03/22 1300 10/03/22 1411  BP:  114/76 132/74   Pulse:  (!) 57 72   Resp:  15 20   Temp:  98.2 F (36.8 C)  98.2 F (36.8 C)  TempSrc:      SpO2: 98% 97% 100%   Weight:      Height:      PainSc:        Isolation Precautions No active isolations  Medications Medications  methylPREDNISolone sodium succinate (SOLU-MEDROL) 125 mg/2 mL injection 125 mg (125 mg Intravenous Given 10/03/22 1339)    Followed by  predniSONE (DELTASONE) tablet 40 mg (has no administration in time range)  cefTRIAXone (ROCEPHIN) 1 g in sodium chloride 0.9 % 100 mL IVPB (0 g Intravenous Stopped 10/03/22 0121)   ipratropium-albuterol (DUONEB) 0.5-2.5 (3) MG/3ML nebulizer solution 3 mL (3 mLs Nebulization Given 10/03/22 1339)  albuterol (PROVENTIL) (2.5 MG/3ML) 0.083% nebulizer solution 2.5 mg (has no administration in time range)  potassium chloride (KLOR-CON M) CR tablet 30 mEq (30 mEq Oral Given 10/03/22 1338)  arformoterol (BROVANA) nebulizer solution 15 mcg (has no administration in time range)  budesonide (PULMICORT) nebulizer solution 0.5 mg (has no administration in time range)  oxyCODONE-acetaminophen (PERCOCET/ROXICET) 5-325 MG per tablet 1 tablet (1 tablet Oral Given 10/03/22 0943)    And  oxyCODONE (Oxy IR/ROXICODONE) immediate release tablet 5 mg (5 mg Oral Given 10/03/22 1339)  enoxaparin (LOVENOX) injection 30 mg (has no administration in time range)  fentaNYL (SUBLIMAZE) injection 25 mcg (25 mcg Intravenous Given 10/02/22 1947)  albuterol (PROVENTIL) (2.5 MG/3ML) 0.083% nebulizer solution (10 mg/hr Nebulization Given 10/02/22 1957)  potassium chloride 10 mEq in 100 mL IVPB (0 mEq Intravenous Stopped 10/03/22 0028)  morphine (PF) 2 MG/ML injection 2 mg (2 mg Intravenous Given 10/02/22 2306)    Mobility walks

## 2022-10-03 NOTE — ED Notes (Signed)
Patient requesting to be taken off bipap this morning, tolerated well most of the night. RT at bedside taking it off to trial on RA.

## 2022-10-03 NOTE — Progress Notes (Signed)
PROGRESS NOTE    LOUKYA Arnold  UJW:[id number] DOB: [birth year] DOA: 10/02/2022 PCP: Patient, No Pcp Per    Brief Narrative:   [name] is a 67 y.o. female with past medical history significant for COPD/asthma, HTN, chronic pain syndrome, OA, tobacco use disorder who presented to [facility] ED from home via EMS on 5/7 with progressive shortness of breath, cough.  Onset last 2-3 days.  Initially endorses some chest pains/tightness.  Denies fever/chills, no sick contacts.  On EMS arrival, patient was noted to be tachypneic with wheezing and received DuoNebs x 3 and IV Solu-Medrol; placed on BiPAP and patient was subsequently transported to the ED for further evaluation.  In the ED, temperature 98.3 F, HR 93, RR 17, BP 127/68, SpO2 100% on BiPAP.  WBC 8.2, hemoglobin 11.7, platelet count 230.  Sodium 135, potassium 2.6, chloride 96, CO2 29, glucose 153, BUN 9, creatinine 0.58.  High sensitive troponin 5 followed by 6.  Chest x-ray with increased bronchial thickening from prior exam, may be acute bronchitis or COPD exacerbation, mild to advanced emphysema.  Patient received IV steroids, DuoNeb, IV potassium, albuterol neb, fentanyl, and morphine.  EDP consulted [facility] for admission for further evaluation management of acute respiratory failure secondary to COPD exacerbation requiring BiPAP.  Assessment & Plan:    COPD exacerbation Patient presenting to ED with progressive shortness of breath, wheezing.  Denies sick contacts.  Patient is afebrile without leukocytosis.  Chest x-ray with increased bronchial thickening from prior exam without focal consolidation.  Patient received IV steroids, 3 nebs by EMS and was placed on BiPAP and transported to the ED for further evaluation. -- Transition BiPAP to nasal cannula today -- Solu-Medrol 125 mg IV every 12 hours x 2 doses followed by prednisone 40 mg p.o. daily starting 5/9 -- Ceftriaxone IV every 24 hours -- Brovana neb twice daily --  Budesonide neb twice daily -- DuoNeb every 6 hours scheduled -- Albuterol neb every 2 hours as needed shortness of breath/wheezing -- Continue supplemental oxygen, maintain SpO2 greater than 88%  Hypokalemia Potassium 2.9, will replete. -- Repeat electrolytes in a.m. to include magnesium  Essential hypertension Home regimen includes enalapril-HCTZ 10-25 mg p.o. daily. -- Continue to monitor off antihypertensives as blood pressure currently well-controlled  Chronic pain syndrome -- Continue Percocet/oxycodone  GERD -- Protonix 80 mg p.o. daily as needed for heartburn/indigestion  Tobacco use disorder Counseled on need for complete cessation/abstinence.  DVT prophylaxis: enoxaparin (LOVENOX) injection 30 mg Start: 10/04/22 1000    Code Status: Full Code Family Communication:   Disposition Plan:  Level of care: Progressive Status is: Inpatient Remains inpatient appropriate because: IV steroids, attempt to wean off of Supple oxygen, scheduled neb treatments    Consultants:  None  Procedures:  None  Antimicrobials:  Ceftriaxone 5/7>>   Subjective: Patient seen examined at bedside, resting comfortably.  Lying in bed.  Sleeping but easy arousable.  Transitioned off of BiPAP earlier this morning now on nasal cannula.  Continues with shortness of breath and wheezing but slightly improved from initial presentation.  No other specific questions or concerns at this time.  Denies headache, no dizziness, no current chest pain, no palpitations, no fever/chills/night sweats, no nausea/vomiting/diarrhea, no focal weakness, no fatigue, no paresthesias.  No acute events overnight per nurse staff.  Objective: Vitals:   10/03/22 0758 10/03/22 1031 10/03/22 1300 10/03/22 1411  BP:  114/76 132/74   Pulse:  (!) 57 72   Resp:  15 20  Temp:  98.2 F (36.8 C)  98.2 F (36.8 C)  TempSrc:      SpO2: 98% 97% 100%   Weight:      Height:        Intake/Output Summary (Last 24 hours) at  10/03/2022 1619 Last data filed at 10/03/2022 0902 Gross per 24 hour  Intake 1080.75 ml  Output --  Net 1080.75 ml   Filed Weights   10/02/22 1843  Weight: 44.5 kg    Examination:  Physical Exam: GEN: NAD, alert and oriented x 3, chronically ill appearance, appears older than stated age HEENT: NCAT, PERRL, EOMI, sclera clear, MMM PULM: Diffuse wheezing throughout all lung fields, no crackles, normal respiratory effort without accessory muscle use, currently on 3 L nasal cannula CV: RRR w/o M/G/R GI: abd soft, NTND, NABS, no R/G/M MSK: no peripheral edema, moves all extremities independently NEURO: CN II-XII intact, no focal deficits, sensation to light touch intact PSYCH: normal mood/affect Integumentary: dry/intact, no rashes or wounds    Data Reviewed: I have personally reviewed following labs and imaging studies  CBC: Recent Labs  Lab 10/02/22 1858 10/03/22 0051 10/03/22 0533  WBC 8.2 5.9 3.8*  HGB 11.7* 11.0* 10.4*  HCT 37.9 35.0* 33.3*  MCV 95.5 94.6 94.3  PLT 230 228 241   Basic Metabolic Panel: Recent Labs  Lab 10/02/22 1858 10/02/22 2020 10/02/22 2309 10/03/22 0051 10/03/22 0533  NA 135  --   --   --  135  K 2.6*  --   --   --  2.9*  CL 96*  --   --   --  97*  CO2 29  --   --   --  30  GLUCOSE 153*  --   --   --  174*  BUN 9  --   --   --  9  CREATININE 0.58  --   --  0.60 0.53  CALCIUM 8.1*  --   --   --  8.2*  MG  --  10.4* 2.3  --   --    GFR: Estimated Creatinine Clearance: 46.6 mL/min (by C-G formula based on SCr of 0.53 mg/dL). Liver Function Tests: Recent Labs  Lab 10/03/22 0533  AST 18  ALT 11  ALKPHOS 44  BILITOT 0.4  PROT 6.3*  ALBUMIN 3.4*   No results for input(s): "LIPASE", "AMYLASE" in the last 168 hours. No results for input(s): "AMMONIA" in the last 168 hours. Coagulation Profile: No results for input(s): "INR", "PROTIME" in the last 168 hours. Cardiac Enzymes: No results for input(s): "CKTOTAL", "CKMB", "CKMBINDEX",  "TROPONINI" in the last 168 hours. BNP (last 3 results) No results for input(s): "PROBNP" in the last 8760 hours. HbA1C: No results for input(s): "HGBA1C" in the last 72 hours. CBG: No results for input(s): "GLUCAP" in the last 168 hours. Lipid Profile: No results for input(s): "CHOL", "HDL", "LDLCALC", "TRIG", "CHOLHDL", "LDLDIRECT" in the last 72 hours. Thyroid Function Tests: No results for input(s): "TSH", "T4TOTAL", "FREET4", "T3FREE", "THYROIDAB" in the last 72 hours. Anemia Panel: No results for input(s): "VITAMINB12", "FOLATE", "FERRITIN", "TIBC", "IRON", "RETICCTPCT" in the last 72 hours. Sepsis Labs: No results for input(s): "PROCALCITON", "LATICACIDVEN" in the last 168 hours.  No results found for this or any previous visit (from the past 240 hour(s)).       Radiology Studies: DG Chest Port 1 View  Result Date: 10/02/2022 CLINICAL DATA:  Shortness of breath. Wheezing. EXAM: PORTABLE CHEST 1 VIEW COMPARISON:  Radiograph and  CT 12/01/2021 FINDINGS: Normal heart size. Stable mediastinal contours. Moderate to advanced emphysema with chronic hyperinflation. There is increased bronchial thickening from prior exam. Blunting of the costophrenic angles typically due to hyperinflation rather than pleural effusions. No focal airspace disease. Pneumothorax. No acute osseous abnormalities are seen. IMPRESSION: 1. Increased bronchial thickening from prior exam, may be acute bronchitis or COPD exacerbation. 2. Moderate to advanced emphysema. Electronically Signed   By: Narda Rutherford M.D.   On: 10/02/2022 19:40        Scheduled Meds:  arformoterol  15 mcg Nebulization BID   budesonide (PULMICORT) nebulizer solution  0.5 mg Nebulization BID   [START ON 10/04/2022] enoxaparin (LOVENOX) injection  30 mg Subcutaneous Q24H   ipratropium-albuterol  3 mL Nebulization Q6H   potassium chloride  30 mEq Oral Q3H   [START ON 10/04/2022] predniSONE  40 mg Oral Q breakfast   Continuous Infusions:   cefTRIAXone (ROCEPHIN)  IV Stopped (10/03/22 0121)     LOS: 1 day    Time spent: 53 minutes spent on chart review, discussion with nursing staff, consultants, updating family and interview/physical exam; more than 50% of that time was spent in counseling and/or coordination of care.    Alvira Philips [name], DO [facility] Available via Epic secure chat 7am-7pm After these hours, please refer to coverage provider listed on [email or web address] 10/03/2022, 4:19 PM

## 2022-10-03 NOTE — Evaluation (Signed)
Occupational Therapy Evaluation Patient Details Name: [name] MRN: [id number] DOB: 03-28-1956 Today's Date: 10/03/2022   History of Present Illness [name] was admitted to the hospital with shortness of breath and cough. She was found to have COPD exacerbation. PMH: COPD, asthma, HTN, chronic pain, OA, sciatica, THA   Clinical Impression   The pt reported being independent with ADLs at her prior level of functioning. She is currently presenting below her baseline level of functioning for self-care management, given deconditioning, compromised endurance, decrease insight into deficits, and shortness of breath with activity. Her O2 saturation was noted to decrease to 89% on room air with activity; she required education on the importance of utilizing supplemental O2 during activity, as she was reluctant to do so. Her O2 saturation improved to 95% on 2L after supplemental O2 was applied. She will benefit from further OT services to maximize her safety and independence with self-care tasks, and to decrease the risk for restricted participation in meaningful activities. OT is recommending home health OT services with supervision upon the pt's discharge.      Recommendations for follow up therapy are one component of a multi-disciplinary discharge planning process, led by the attending physician.  Recommendations may be updated based on patient status, additional functional criteria and insurance authorization.   Assistance Recommended at Discharge Set up Supervision/Assistance  Patient can return home with the following Assistance with cooking/housework;A little help with bathing/dressing/bathroom    Functional Status Assessment  Patient has had a recent decline in their functional status and demonstrates the ability to make significant improvements in function in a reasonable and predictable amount of time.  Equipment Recommendations  Tub/shower seat    Recommendations for Other Services        Precautions / Restrictions Restrictions Other Position/Activity Restrictions: monitor O2 saturation      Mobility Bed Mobility Overal bed mobility: Needs Assistance Bed Mobility: Supine to Sit, Sit to Supine     Supine to sit: Supervision, Min guard Sit to supine: Supervision        Transfers Overall transfer level: Needs assistance Equipment used: None Transfers: Sit to/from Stand Sit to Stand: Min guard           Balance       Sitting balance - Comments: good       Standing balance comment: min guard assist             ADL either performed or assessed with clinical judgement   ADL Overall ADL's : Needs assistance/impaired Eating/Feeding: Independent Eating/Feeding Details (indicate cue type and reason): based on clinical judgement Grooming: Set up;Sitting           Upper Body Dressing : Set up;Sitting Upper Body Dressing Details (indicate cue type and reason): simulated seated EOB     Toilet Transfer: Min guard;Ambulation;BSC/3in1;Cueing for safety   Toileting- Clothing Manipulation and Hygiene: Min guard;Cueing for safety Toileting - Clothing Manipulation Details (indicate cue type and reason): Pt performed toileting at bedside commode level, requiring intermittent cues for safety and to implement pursed lip breathing, given shortness of breath with activity and compromised endurance. She further required steadying assist in standing. She performed anterior seated hygiene with SBA.             Vision   Additional Comments: Not formally assessed, however appeared WFLfor participation in the session     Perception     Praxis      Pertinent Vitals/Pain Pain Assessment Pain Assessment: 0-10 Pain Score:  9  Pain Location: chest Pain Intervention(s): Limited activity within patient's tolerance, Patient requesting pain meds-RN notified     Hand Dominance Right   Extremity/Trunk Assessment Upper Extremity Assessment Upper  Extremity Assessment: Overall WFL for tasks assessed   Lower Extremity Assessment Lower Extremity Assessment: Overall WFL for tasks assessed       Communication Communication Communication: No difficulties   Cognition Arousal/Alertness: Awake/alert Behavior During Therapy: WFL for tasks assessed/performed Overall Cognitive Status: Within Functional Limits for tasks assessed Area of Impairment: Safety/judgement      General Comments: Oriented x4, able to follow commands, required intermittent cues for safety awareness, decreased insight into deficits                Home Living Family/patient expects to be discharged to:: Private residence Living Arrangements: Alone   Type of Home: Other(Comment) (Duplex) Home Access: Level entry     Home Layout: One level     Bathroom Shower/Tub: Tub/shower unit         Home Equipment: Cane - single point          Prior Functioning/Environment          Mobility Comments: She was independent with ambulation. ADLs Comments: She reported being independent with ADLs, cooking, cleaning, and driving.        OT Problem List: Decreased activity tolerance;Impaired balance (sitting and/or standing);Decreased safety awareness;Decreased knowledge of use of DME or AE;Cardiopulmonary status limiting activity;Pain      OT Treatment/Interventions: Self-care/ADL training;Therapeutic exercise;Therapeutic activities;Energy conservation;DME and/or AE instruction;Patient/family education;Balance training    OT Goals(Current goals can be found in the care plan section) Acute Rehab OT Goals Patient Stated Goal: to get better to return to normal activities OT Goal Formulation: With patient Time For Goal Achievement: 10/17/22 Potential to Achieve Goals: Good ADL Goals Pt Will Perform Grooming: with modified independence;standing Pt Will Perform Lower Body Dressing: with modified independence;sit to/from stand Pt Will Perform Toileting -  Clothing Manipulation and hygiene: with modified independence;sit to/from stand Pt/caregiver will Perform Home Exercise Program: Both right and left upper extremity;Increased strength Additional ADL Goal #1: The pt will independently verbalize and/or implement at least 3 energy conservation strategies during ADLs, to facilitate improved activity tolerance.  OT Frequency: Min 2X/week       AM-PAC OT "6 Clicks" Daily Activity     Outcome Measure Help from another person eating meals?: None Help from another person taking care of personal grooming?: A Little Help from another person toileting, which includes using toliet, bedpan, or urinal?: A Little Help from another person bathing (including washing, rinsing, drying)?: A Little Help from another person to put on and taking off regular upper body clothing?: None Help from another person to put on and taking off regular lower body clothing?: A Little 6 Click Score: 20   End of Session Equipment Utilized During Treatment: Oxygen Nurse Communication: Mobility status  Activity Tolerance: Other (comment) (Fair tolerance. Limited by shortness of breath with activity and compromised endurance) Patient left: in bed;with call bell/phone within reach  OT Visit Diagnosis: Unsteadiness on feet (R26.81)                Time: 8119-1478 OT Time Calculation (min): 33 min Charges:  OT General Charges $OT Visit: 1 Visit OT Evaluation $OT Eval Moderate Complexity: 1 Mod OT Treatments $Self Care/Home Management : 8-22 mins    Reuben Likes, OTR/L 10/03/2022, 10:21 AM

## 2022-10-03 NOTE — Significant Event (Signed)
Rapid Response Event Note   Reason for Call :  Requested by bedside RN to evaluate patient's breathing.  Initial Focused Assessment:  Patient is wheezing throughout all lung fields, but speaking in full sentences. Patient is alert/oriented x 4. Respiratory therapist also at bedside. Patient denies pain. Patient reports concerns about rapid weight loss, thinking she is going to die, has been celibate for eighteen years, and needing sleeping medication, specifically Ambien 10mg .  Interventions:  Additional nebulizer treatment. Provided active listening. Provider ordered lower dose of Ambien and was educated as to why lower dose prescribed. Encouraged talking to provider about other concerns related to overall health and testing to see why she is losing weight. Respiratory therapist taught her how to use flutter valve device to assist in deeper breathing and productive coughing.  Plan of Care:  Will remain in current level of care at this time. No further diagnostic studies ordered at this time as doubtful to change plan of care. Continue monitoring condition and call if symptoms worsen.  Event Summary:   MD Notified: Anthoney Harada, NP Call Time: 2030 Arrival Time: 2045 End Time: 2115  Lamona Curl, RN

## 2022-10-03 NOTE — Progress Notes (Signed)
PT requesting to come off bipap, RT going to trial PT off of it, currently tolerating well on RA.

## 2022-10-04 DIAGNOSIS — J441 Chronic obstructive pulmonary disease with (acute) exacerbation: Secondary | ICD-10-CM | POA: Diagnosis not present

## 2022-10-04 DIAGNOSIS — E43 Unspecified severe protein-calorie malnutrition: Secondary | ICD-10-CM | POA: Insufficient documentation

## 2022-10-04 LAB — BASIC METABOLIC PANEL
Anion gap: 4 — ABNORMAL LOW (ref 5–15)
BUN: 16 mg/dL (ref 8–23)
CO2: 31 mmol/L (ref 22–32)
Calcium: 8.6 mg/dL — ABNORMAL LOW (ref 8.9–10.3)
Chloride: 103 mmol/L (ref 98–111)
Creatinine, Ser: 0.62 mg/dL (ref 0.44–1.00)
GFR, Estimated: >60 mL/min (ref >60)
Glucose, Bld: 115 mg/dL — ABNORMAL HIGH (ref 70–99)
Potassium: 5 mmol/L (ref 3.5–5.1)
Sodium: 138 mmol/L (ref 135–145)

## 2022-10-04 LAB — MAGNESIUM: Magnesium: 2.3 mg/dL (ref 1.7–2.4)

## 2022-10-04 LAB — GLUCOSE, CAPILLARY: Glucose-Capillary: 92 mg/dL (ref 70–99)

## 2022-10-04 MED ORDER — ENSURE ENLIVE PO LIQD
237.0000 mL | Freq: Three times a day (TID) | ORAL | Status: DC
  Administered 2022-10-04 – 2022-10-10 (×11): 237 mL via ORAL

## 2022-10-04 MED ORDER — METHYLPREDNISOLONE SODIUM SUCC 40 MG IJ SOLR
40.0000 mg | Freq: Two times a day (BID) | INTRAMUSCULAR | Status: DC
  Administered 2022-10-04 – 2022-10-06 (×4): 40 mg via INTRAVENOUS
  Filled 2022-10-04 (×4): qty 1

## 2022-10-04 MED ORDER — HALOPERIDOL LACTATE 5 MG/ML IJ SOLN
1.0000 mg | Freq: Four times a day (QID) | INTRAMUSCULAR | Status: DC | PRN
  Administered 2022-10-04 – 2022-10-06 (×3): 1 mg via INTRAVENOUS
  Filled 2022-10-04 (×3): qty 1

## 2022-10-04 MED ORDER — ADULT MULTIVITAMIN W/MINERALS CH
1.0000 | ORAL_TABLET | Freq: Every day | ORAL | Status: DC
  Administered 2022-10-04 – 2022-10-11 (×7): 1 via ORAL
  Filled 2022-10-04 (×7): qty 1

## 2022-10-04 MED ORDER — LORAZEPAM 1 MG PO TABS
1.0000 mg | ORAL_TABLET | Freq: Four times a day (QID) | ORAL | Status: DC | PRN
  Administered 2022-10-04 – 2022-10-11 (×13): 1 mg via ORAL
  Filled 2022-10-04: qty 1
  Filled 2022-10-04: qty 2
  Filled 2022-10-04 (×3): qty 1
  Filled 2022-10-04: qty 2
  Filled 2022-10-04 (×3): qty 1
  Filled 2022-10-04: qty 2
  Filled 2022-10-04 (×4): qty 1

## 2022-10-04 NOTE — Progress Notes (Signed)
The sister Sedalia Muta wanted an update her number is [phone]. Spoke to pt & confirmed ok to speak with sister. Stated ok. Called but no answer. Left a voicemail as to nurse's name & location, did not state patient's name over VM. Informed could call back, if still would like an update.

## 2022-10-04 NOTE — TOC Initial Note (Addendum)
Transition of Care [facility]) - Initial/Assessment Note    Patient Details  Name: [name] MRN: [id number] Date of Birth: 21-Aug-1955  Transition of [facility]) CM/SW Contact:    Howell Rucks, RN Phone Number: 10/04/2022, 9:50 AM  Clinical Narrative:  Met with pt at bedside to introduce role of  TOC/NCM and review dc plans, OT recommended HH OT, no PT or skilled nursing. Educated pt that OT mainly for assist with ADL's and   has to be paired with a skilled need such as PT or skilled nursing for insurance coverage, pt voiced understanding. OT recommended tub/shower chair , pt would like tub bench. [employer]- rep Jermaine for home nebulizer machine and shower chair. Will continue to follow.                   Patient Goals and CMS Choice            Expected Discharge Plan and Services                                              Prior Living Arrangements/Services                       Activities of Daily Living Home Assistive Devices/Equipment: Nebulizer, Cane (specify quad or straight) ADL Screening (condition at time of admission) Patient's cognitive ability adequate to safely complete daily activities?: Yes Is the patient deaf or have difficulty hearing?: No Does the patient have difficulty seeing, even when wearing glasses/contacts?: No Does the patient have difficulty concentrating, remembering, or making decisions?: Yes Patient able to express need for assistance with ADLs?: Yes Does the patient have difficulty dressing or bathing?: Yes Independently performs ADLs?: Yes (appropriate for developmental age) Does the patient have difficulty walking or climbing stairs?: Yes Weakness of Legs: Both Weakness of Arms/Hands: None  Permission Sought/Granted                  Emotional Assessment              Admission diagnosis:  COPD exacerbation (HCC) [J44.1] COPD with acute exacerbation (HCC) [J44.1] Acute on chronic respiratory failure with  hypoxia (HCC) [J96.21] Patient Active Problem List   Diagnosis Date Noted   COPD with acute exacerbation (HCC) 10/02/2022   Grief 06/03/2022   Pain of left upper extremity 12/28/2016   Elevated TSH 11/08/2016   Gastroesophageal reflux disease 11/08/2016   Constipation 11/08/2016   COPD (chronic obstructive pulmonary disease) (HCC) 11/08/2016   Tobacco use 11/08/2016   Atypical chest pain 11/07/2016   Unstable angina (HCC) 01/07/2016   HTN (hypertension) 01/07/2016   Acute blood loss anemia 11/06/2013   Osteoarthritis of left hip 11/05/2013   History of total left hip replacement 11/05/2013   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   PCP:  Patient, No Pcp Per Pharmacy:   [id number] [place], [place] - [address] AT [facility] [address] [place] [address] Phone: [phone] Fax: [phone]  [id number] - [place], [place] - [address] AT [facility] [address] [place] [address] Phone: [phone] Fax: [phone]     Social Determinants of Health (SDOH) Social History: SDOH Screenings   Food Insecurity: No Food Insecurity (10/03/2022)  Recent Concern:  Food Insecurity - Food Insecurity Present (09/08/2022)  Housing: Low Risk  (10/03/2022)  Recent Concern: Housing - Medium Risk (09/08/2022)  Transportation Needs: No Transportation Needs (10/03/2022)  Utilities: Not At Risk (10/03/2022)  Depression (PHQ2-9): Low Risk  (10/05/2020)  Financial Resource Strain: Low Risk  (10/05/2020)  Social Connections: Moderately Integrated (10/05/2020)  Stress: No Stress Concern Present (10/05/2020)  Tobacco Use: High Risk (10/02/2022)   SDOH Interventions:     Readmission Risk Interventions    10/04/2022    9:50 AM  Readmission Risk Prevention Plan  Post Dischage Appt Complete  Medication Screening Complete  Transportation Screening Complete

## 2022-10-04 NOTE — Significant Event (Signed)
Rapid Response Event Note   Reason for Call : Respiratory Distress  Notified by 4th floor AD that patient was having increased WOB and hypoxia with O2 sats decreasing to low 80s. RNs at beside increased O2 via simple mask while patient was receiving a prn albuterol treatment for wheezing. Upon arrival, patient was still receiving albuterol neb with O2 sats back to 100%. Patient was still experiencing SOB and appeared to have an increase in WOB.   Initial Focused Assessment:  Neuro: Alert and oriented x4, able to follow simple commands and move all extremities Cardiac: Sinus Tachycardia, S1 and S2 auscultated, BP HTN see vitals  Pulmonary: Expiratory wheezing noted w/out auscultation, wheezing and diminished breath sounds auscultated throughout lung fields, O2 Sats 100% on 3L  Justice, tachypnea S/S appear to coincide with COPD exacerbation   Interventions:  Bedside RN notified MD to change PO steroids to IV steroids  Continue Bronchodilators scheduled and as needed   Discussed with patient about ways to decrease anxiety   Plan of Care:  After prn albterol given, patient WOB improved. O2 Sats rebounded with 3liters of O2, decrease as tolerated.  Continue to monitor patient on cardiac telemetry on 4th floor progressive unit. If patient develops increased WOB or respiratory distress or has a change in hemodynamic status change please call Rapid Response.    Event Summary:   MD Notified: MD [name] by bedside RN  Call Time: Rapid called 1516  Arrival Time: 1520  End Time: 1600   Kebra Lowrimore C, RN

## 2022-10-04 NOTE — Progress Notes (Signed)
Assisted pt to restroom, became diaphoretic, eyes rolling, & extremely weak. Called for assistance & rapid response team, when pt almost passed out. Glucose obtained, BP & O2 sats. Rapid nurse in room assessing pt now. Notified Dr. [name].

## 2022-10-04 NOTE — Progress Notes (Signed)
PROGRESS NOTE    [name]  NWG:[id number] DOB: 1955-10-25 DOA: 10/02/2022 PCP: Patient, No Pcp Per    Brief Narrative:   [name] is a 67 y.o. female with past medical history significant for COPD/asthma, HTN, chronic pain syndrome, OA, tobacco use disorder who presented to [facility] ED from home via EMS on 5/7 with progressive shortness of breath, cough.  Onset last 2-3 days.  Initially endorses some chest pains/tightness.  Denies fever/chills, no sick contacts.  On EMS arrival, patient was noted to be tachypneic with wheezing and received DuoNebs x 3 and IV Solu-Medrol; placed on BiPAP and patient was subsequently transported to the ED for further evaluation.  In the ED, temperature 98.3 F, HR 93, RR 17, BP 127/68, SpO2 100% on BiPAP.  WBC 8.2, hemoglobin 11.7, platelet count 230.  Sodium 135, potassium 2.6, chloride 96, CO2 29, glucose 153, BUN 9, creatinine 0.58.  High sensitive troponin 5 followed by 6.  Chest x-ray with increased bronchial thickening from prior exam, may be acute bronchitis or COPD exacerbation, mild to advanced emphysema.  Patient received IV steroids, DuoNeb, IV potassium, albuterol neb, fentanyl, and morphine.  EDP consulted [facility] for admission for further evaluation management of acute respiratory failure secondary to COPD exacerbation requiring BiPAP.  Assessment & Plan:    COPD exacerbation Patient presenting to ED with progressive shortness of breath, wheezing.  Denies sick contacts.  Patient is afebrile without leukocytosis.  Chest x-ray with increased bronchial thickening from prior exam without focal consolidation.  Patient received IV steroids, 3 nebs by EMS and was placed on BiPAP and transported to the ED for further evaluation.  BiPAP now transition off to nasal cannula. -- Prednisone 40 mg p.o. daily -- Ceftriaxone IV every 24 hours -- Brovana neb twice daily -- Budesonide neb twice daily -- DuoNeb every 6 hours scheduled -- Albuterol  neb every 2 hours as needed shortness of breath/wheezing -- Continue supplemental oxygen, maintain SpO2 > 88%; currently on 2 L nasal cannula with SpO2 100% at rest -- Ambulatory O2 walk screen today  Hypokalemia: Resolved Repleted during hospitalization.  Essential hypertension Home regimen includes enalapril-HCTZ 10-25 mg p.o. daily. -- Continue to monitor off antihypertensives as blood pressure currently well-controlled  Chronic pain syndrome -- Continue Percocet/oxycodone  GERD -- Protonix 80 mg p.o. daily as needed for heartburn/indigestion  Tobacco use disorder Counseled on need for complete cessation/abstinence.  Anxiety -- Ativan 1 mg p.o. every 6 hours as needed anxiety -- Haldol as needed agitation  Severe protein calorie malnutrition Body mass index is 18.52 kg/m.  Nutrition Status: Nutrition Problem: Severe Malnutrition Etiology: chronic illness (COPD) Signs/Symptoms: severe fat depletion, severe muscle depletion Interventions: Ensure Enlive (each supplement provides 350kcal and 20 grams of protein), MVI, Magic cup -- Dietitian following, diet liberalized to regular, continue to encourage increased oral intake, supplementation  DVT prophylaxis: enoxaparin (LOVENOX) injection 30 mg Start: 10/04/22 1000    Code Status: Full Code Family Communication:   Disposition Plan:  Level of care: Med-Surg Status is: Inpatient Remains inpatient appropriate because: IV steroids, attempt to wean off of Supple oxygen, scheduled neb treatments    Consultants:  None  Procedures:  None  Antimicrobials:  Ceftriaxone 5/7>>   Subjective: Patient seen examined at bedside, resting comfortably.  Lying in bed.  Anxious.  Rapid response called overnight for concern of patient's breathing, although patient was oxygenating well on her 2 L nasal cannula.  Etiology likely more of anxiety.  Once again  this morning patient was anxious, RN requesting anxiety medication.  Patient also  states stating she may leave AMA.  At bedside, patient requesting a viable, stated will consult the chaplain for assistance. No other specific questions or concerns at this time.  Denies headache, no dizziness, no current chest pain, no palpitations, no fever/chills/night sweats, no nausea/vomiting/diarrhea, no focal weakness, no fatigue, no paresthesias.  No acute events overnight per nurse staff.  Patient does possess capacity for medical decision making at this time.  Objective: Vitals:   10/03/22 2108 10/04/22 0008 10/04/22 0447 10/04/22 0733  BP: (!) 103/55 112/73 132/77   Pulse: 84 89 62   Resp: 18 17 17    Temp: 98.7 F (37.1 C) 98.5 F (36.9 C) 98.1 F (36.7 C)   TempSrc: Oral Oral Oral   SpO2: 97% 99% 100% 99%  Weight:      Height:        Intake/Output Summary (Last 24 hours) at 10/04/2022 1248 Last data filed at 10/04/2022 0730 Gross per 24 hour  Intake 480 ml  Output --  Net 480 ml   Filed Weights   10/02/22 1843  Weight: 44.5 kg    Examination:  Physical Exam: GEN: NAD, alert and oriented x 3, chronically ill appearance, appears older than stated age HEENT: NCAT, PERRL, EOMI, sclera clear, MMM PULM: Mild mid-to-late expiratory wheezing wheezing throughout all lung fields, no crackles, normal respiratory effort without accessory muscle use, currently on 2 L nasal cannula with SpO2 1 high percent at rest CV: RRR w/o M/G/R GI: abd soft, NTND, NABS, no R/G/M MSK: no peripheral edema, moves all extremities independently NEURO: CN II-XII intact, no focal deficits, sensation to light touch intact PSYCH: Anxious mood Integumentary: dry/intact, no rashes or wounds    Data Reviewed: I have personally reviewed following labs and imaging studies  CBC: Recent Labs  Lab 10/02/22 1858 10/03/22 0051 10/03/22 0533  WBC 8.2 5.9 3.8*  HGB 11.7* 11.0* 10.4*  HCT 37.9 35.0* 33.3*  MCV 95.5 94.6 94.3  PLT 230 228 241   Basic Metabolic Panel: Recent Labs  Lab  10/02/22 1858 10/02/22 2020 10/02/22 2309 10/03/22 0051 10/03/22 0533 10/04/22 0524  NA 135  --   --   --  135 138  K 2.6*  --   --   --  2.9* 5.0  CL 96*  --   --   --  97* 103  CO2 29  --   --   --  30 31  GLUCOSE 153*  --   --   --  174* 115*  BUN 9  --   --   --  9 16  CREATININE 0.58  --   --  0.60 0.53 0.62  CALCIUM 8.1*  --   --   --  8.2* 8.6*  MG  --  10.4* 2.3  --   --  2.3   GFR: Estimated Creatinine Clearance: 46.6 mL/min (by C-G formula based on SCr of 0.62 mg/dL). Liver Function Tests: Recent Labs  Lab 10/03/22 0533  AST 18  ALT 11  ALKPHOS 44  BILITOT 0.4  PROT 6.3*  ALBUMIN 3.4*   No results for input(s): "LIPASE", "AMYLASE" in the last 168 hours. No results for input(s): "AMMONIA" in the last 168 hours. Coagulation Profile: No results for input(s): "INR", "PROTIME" in the last 168 hours. Cardiac Enzymes: No results for input(s): "CKTOTAL", "CKMB", "CKMBINDEX", "TROPONINI" in the last 168 hours. BNP (last 3 results) No results for  input(s): "PROBNP" in the last 8760 hours. HbA1C: No results for input(s): "HGBA1C" in the last 72 hours. CBG: No results for input(s): "GLUCAP" in the last 168 hours. Lipid Profile: No results for input(s): "CHOL", "HDL", "LDLCALC", "TRIG", "CHOLHDL", "LDLDIRECT" in the last 72 hours. Thyroid Function Tests: No results for input(s): "TSH", "T4TOTAL", "FREET4", "T3FREE", "THYROIDAB" in the last 72 hours. Anemia Panel: No results for input(s): "VITAMINB12", "FOLATE", "FERRITIN", "TIBC", "IRON", "RETICCTPCT" in the last 72 hours. Sepsis Labs: No results for input(s): "PROCALCITON", "LATICACIDVEN" in the last 168 hours.  No results found for this or any previous visit (from the past 240 hour(s)).       Radiology Studies: DG Chest Port 1 View  Result Date: 10/02/2022 CLINICAL DATA:  Shortness of breath. Wheezing. EXAM: PORTABLE CHEST 1 VIEW COMPARISON:  Radiograph and CT 12/01/2021 FINDINGS: Normal heart size. Stable  mediastinal contours. Moderate to advanced emphysema with chronic hyperinflation. There is increased bronchial thickening from prior exam. Blunting of the costophrenic angles typically due to hyperinflation rather than pleural effusions. No focal airspace disease. Pneumothorax. No acute osseous abnormalities are seen. IMPRESSION: 1. Increased bronchial thickening from prior exam, may be acute bronchitis or COPD exacerbation. 2. Moderate to advanced emphysema. Electronically Signed   By: Narda Rutherford M.D.   On: 10/02/2022 19:40        Scheduled Meds:  arformoterol  15 mcg Nebulization BID   budesonide (PULMICORT) nebulizer solution  0.5 mg Nebulization BID   enoxaparin (LOVENOX) injection  30 mg Subcutaneous Q24H   feeding supplement  237 mL Oral TID BM   ipratropium-albuterol  3 mL Nebulization Q6H   multivitamin with minerals  1 tablet Oral Daily   predniSONE  40 mg Oral Q breakfast   Continuous Infusions:  cefTRIAXone (ROCEPHIN)  IV 1 g (10/04/22 0009)     LOS: 2 days    Time spent: 53 minutes spent on chart review, discussion with nursing staff, consultants, updating family and interview/physical exam; more than 50% of that time was spent in counseling and/or coordination of care.    Alvira Philips [name], DO [facility] Available via Epic secure chat 7am-7pm After these hours, please refer to coverage provider listed on [email or web address] 10/04/2022, 12:48 PM

## 2022-10-04 NOTE — Progress Notes (Signed)
Initial Nutrition Assessment  DOCUMENTATION CODES:   Severe malnutrition in context of chronic illness, Underweight  INTERVENTION:   -Ensure Plus High Protein po TID, each supplement provides 350 kcal and 20 grams of protein.   -Magic cup TID with meals, each supplement provides 290 kcal and 9 grams of protein   -Multivitamin with minerals daily  -Placed lunch order for patient  NUTRITION DIAGNOSIS:   Severe Malnutrition related to chronic illness (COPD) as evidenced by severe fat depletion, severe muscle depletion.  GOAL:   Patient will meet greater than or equal to 90% of their needs  MONITOR:   PO intake, Supplement acceptance, Labs, Weight trends, I & O's, Skin  REASON FOR ASSESSMENT:   Consult Assessment of nutrition requirement/status  ASSESSMENT:   67 y.o. female with past medical history significant for COPD/asthma, HTN, chronic pain syndrome, OA, tobacco use disorder who presented to [facility] ED from home via EMS on 5/7 with progressive shortness of breath, cough. Admitted for COPD exacerbation.  Patient in room, states she never got her breakfast this morning. Feels hungry but states her appetite in general has not been good at home. She is frustrated that she cannot gain weight. Pt has continued to smoke.  Staff reordered breakfast for patient and RD placed lunch order for later. Pt agreeable to Ensure supplements in between meals. Pt does not take vitamin supplements at home, agreeable to daily MVI.  Per patient, UBW ~148 lbs. Has been losing weight since 2021.  Current weight: 98 lbs.  Medications reviewed.  Labs reviewed.  NUTRITION - FOCUSED PHYSICAL EXAM:  Flowsheet Row Most Recent Value  Orbital Region Severe depletion  Upper Arm Region Severe depletion  Thoracic and Lumbar Region Severe depletion  Buccal Region Moderate depletion  Temple Region Moderate depletion  Clavicle Bone Region Severe depletion  Clavicle and Acromion Bone  Region Severe depletion  Scapular Bone Region Severe depletion  Dorsal Hand Severe depletion  Patellar Region Severe depletion  Anterior Thigh Region Severe depletion  Posterior Calf Region Severe depletion  Edema (RD Assessment) None  Hair Unable to assess  [covered]  Eyes Reviewed  Mouth Reviewed  [dry]  Skin Reviewed  [dry]  Nails Reviewed  [broken, jagged]       Diet Order:   Diet Order             Diet regular Room service appropriate? Yes; Fluid consistency: Thin  Diet effective now                   EDUCATION NEEDS:   Not appropriate for education at this time  Skin:  Skin Assessment: Reviewed RN Assessment  Last BM:  PTA  Height:   Ht Readings from Last 1 Encounters:  10/02/22 5\' 1"  (1.549 m)    Weight:   Wt Readings from Last 1 Encounters:  10/02/22 44.5 kg    BMI:  Body mass index is 18.52 kg/m.  Estimated Nutritional Needs:   Kcal:  1600-1800  Protein:  75-90g  Fluid:  1.8L/day  Tilda Franco, MS, RD, LDN Inpatient Clinical Dietitian Contact information available via Amion

## 2022-10-04 NOTE — Progress Notes (Signed)
Chaplain engaged in an initial visit with Safiyah. Marcos expressed her dismay with being in the hospital and her desire to leave AMA. Chaplain asked Zisel why she wanted to leave and she expressed that she has no idea why she is taking antibiotics and why she cannot breathe. Valerye holds the belief that the medical team wants to kill her or harm her in some way. She also expressed not knowing why she has gone from 143 lbs to 98 lbs over the course of a year with numerous tests and scans.   Chaplain affirmed her frustrations while uplifting the teams desire to help her. Chaplain and Benna talked about what it means to have COPD and how that gradually changes over time. Chaplain spoke with nurse to relay to [facility] why she is taking antibiotics. Annaleese had some questions on why she is not taking her blood pressure medicine as well. Chaplain assessed that Gurkirat holds a lot of distrust with hospitals and medicine, and that she struggles understanding medically what is happening to her body.   Chaplain worked to be a supportive and compassionate presence. Chaplain highlighted the teams goal of working to take care of her and assist her with her needs. Chaplain was able to bring Montez a Bible and read scripture with her until she feel asleep.   Chaplain recommends more conversations with Lainee about her health and the interventions that exist for her, as well as giving her clarity on what COPD looks like long-term. Vivian was very worried about no longer being able to go to work and the possibilities of being on oxygen. 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   Chaplain Toshika Parrow, MDiv  10/04/22 1400  Spiritual Encounters  Type of Visit Initial  Care provided to: Patient  Referral source Patient request  Reason for visit Routine spiritual support  Spiritual Framework  Presenting Themes Impactful experiences and emotions;Community and  relationships;Significant life change;Values and beliefs  Patient Stress Factors Major life changes;Loss of control;Lack of knowledge;Health changes;Exhausted;Family relationships  Interventions  Spiritual Care Interventions Made Established relationship of care and support;Compassionate presence;Narrative/life review;Reflective listening;Explored ethical dilemma

## 2022-10-04 NOTE — Progress Notes (Signed)
Pt has been intensely emotional today. At one point, states she appreciates everything being done for her. Next moment, states she is going to leave AMA. Currently yelling at tech, who ordered her breakfast. Pt does not remember eating, prior to taking a morning nap, & believes someone ate it. She has made several statements that she wants to leave AMA & just go home to die. MD & charge RN aware.

## 2022-10-04 NOTE — Progress Notes (Signed)
Per rapid RN, will keep pt in bed, & utilize purwick for urinating until breathing is controlled.

## 2022-10-05 DIAGNOSIS — J441 Chronic obstructive pulmonary disease with (acute) exacerbation: Secondary | ICD-10-CM | POA: Diagnosis not present

## 2022-10-05 MED ORDER — SERTRALINE HCL 50 MG PO TABS
50.0000 mg | ORAL_TABLET | Freq: Every day | ORAL | Status: DC
  Administered 2022-10-05 – 2022-10-09 (×4): 50 mg via ORAL
  Filled 2022-10-05 (×5): qty 1

## 2022-10-05 MED ORDER — ZOLPIDEM TARTRATE 5 MG PO TABS: 7.5000 mg | ORAL_TABLET | Freq: Every evening | ORAL | Status: DC | PRN

## 2022-10-05 MED ORDER — ZOLPIDEM TARTRATE 5 MG PO TABS
5.0000 mg | ORAL_TABLET | Freq: Every evening | ORAL | Status: AC | PRN
  Administered 2022-10-05: 5 mg via ORAL
  Filled 2022-10-05: qty 1

## 2022-10-05 MED ORDER — REVEFENACIN 175 MCG/3ML IN SOLN
175.0000 ug | Freq: Every day | RESPIRATORY_TRACT | Status: DC
  Administered 2022-10-06 – 2022-10-11 (×6): 175 ug via RESPIRATORY_TRACT
  Filled 2022-10-05 (×6): qty 3

## 2022-10-05 NOTE — Plan of Care (Signed)

## 2022-10-05 NOTE — Progress Notes (Signed)
Mobility Specialist - Progress Note   10/05/22 1551  Mobility  Activity Ambulated with assistance in hallway;Transferred to/from [facility]  Level of Assistance Standby assist, set-up cues, supervision of patient - no hands on  Assistive Device Front wheel walker  Distance Ambulated (ft) 80 ft  Activity Response Tolerated well  Mobility Referral Yes  $Mobility charge 1 Mobility  Mobility Specialist Start Time (ACUTE ONLY) 0324  Mobility Specialist Stop Time (ACUTE ONLY) 0349  Mobility Specialist Time Calculation (min) (ACUTE ONLY) 25 min   Pt received on BSC and agreeable to mobility. No complaints during the session. Pt very SOB throughout session prompting in shorter ambulation. Pt to recliner after session with all needs met.    Pre-mobility: 88 HR, 97% SpO2 During mobility: 91 HR, 98%  SpO2 Post-mobility: 66 HR, 93% SPO2  Chief Technology Officer

## 2022-10-05 NOTE — Progress Notes (Signed)
PROGRESS NOTE    [name]  JXB:[id number] DOB: 19-Apr-1956 DOA: 10/02/2022 PCP: Patient, No Pcp Per    Brief Narrative:   [name] is a 67 y.o. female with past medical history significant for COPD/asthma, HTN, chronic pain syndrome, OA, tobacco use disorder who presented to [facility] ED from home via EMS on 5/7 with progressive shortness of breath, cough.  Onset last 2-3 days.  Initially endorses some chest pains/tightness.  Denies fever/chills, no sick contacts.  On EMS arrival, patient was noted to be tachypneic with wheezing and received DuoNebs x 3 and IV Solu-Medrol; placed on BiPAP and patient was subsequently transported to the ED for further evaluation.  In the ED, temperature 98.3 F, HR 93, RR 17, BP 127/68, SpO2 100% on BiPAP.  WBC 8.2, hemoglobin 11.7, platelet count 230.  Sodium 135, potassium 2.6, chloride 96, CO2 29, glucose 153, BUN 9, creatinine 0.58.  High sensitive troponin 5 followed by 6.  Chest x-ray with increased bronchial thickening from prior exam, may be acute bronchitis or COPD exacerbation, mild to advanced emphysema.  Patient received IV steroids, DuoNeb, IV potassium, albuterol neb, fentanyl, and morphine.  EDP consulted [facility] for admission for further evaluation management of acute respiratory failure secondary to COPD exacerbation requiring BiPAP.  Assessment & Plan:    COPD exacerbation Patient presenting to ED with progressive shortness of breath, wheezing.  Denies sick contacts.  Patient is afebrile without leukocytosis.  Chest x-ray with increased bronchial thickening from prior exam without focal consolidation.  Patient received IV steroids, 3 nebs by EMS and was placed on BiPAP and transported to the ED for further evaluation.  BiPAP now transition off to nasal cannula. -- Prednisone 40 mg p.o. daily -- Ceftriaxone IV every 24 hours -- Brovana neb twice daily -- Budesonide neb twice daily -- Yupelri 175 mcg neb daily -- Albuterol neb  every 2 hours as needed shortness of breath/wheezing -- Continue supplemental oxygen, maintain SpO2 > 88%; currently on 2 L nasal cannula with SpO2 100% at rest -- Ambulatory O2 walk screen today  Hypokalemia: Resolved Repleted during hospitalization.  Essential hypertension Home regimen includes enalapril-HCTZ 10-25 mg p.o. daily. -- Continue to monitor off antihypertensives as blood pressure currently well-controlled  Chronic pain syndrome -- Continue Percocet/oxycodone  GERD -- Protonix 80 mg p.o. daily as needed for heartburn/indigestion  Tobacco use disorder Counseled on need for complete cessation/abstinence.  Anxiety Hx Mood do NOS Last hospitalized at [facility] for concern of auditory/visual hallucinations and questionable suicidal ideation in 2011.  Was initially started on Abilify but side effects of nausea/dizziness and was changed to Geodon.  Currently not on any anxiety, depressive or mood stabilizer currently.  During this hospital course, patient has had multiple fluctuations in anxiety. -- Start Zoloft 50 mg p.o. daily -- Consider starting previous use of Geodon at a lower dose nightly depending on response to Zoloft -- Ativan 1 mg p.o. every 6 hours as needed anxiety -- Haldol as needed agitation  Severe protein calorie malnutrition Body mass index is 18.52 kg/m.  Nutrition Status: Nutrition Problem: Severe Malnutrition Etiology: chronic illness (COPD) Signs/Symptoms: severe fat depletion, severe muscle depletion Interventions: Ensure Enlive (each supplement provides 350kcal and 20 grams of protein), MVI, Magic cup -- Dietitian following, diet liberalized to regular, continue to encourage increased oral intake, supplementation  DVT prophylaxis: enoxaparin (LOVENOX) injection 30 mg Start: 10/04/22 1000    Code Status: Full Code Family Communication: No family present at bedside this  morning  Disposition Plan:  Level of care: Med-Surg Status is:  Inpatient Remains inpatient appropriate because: IV steroids, attempt to wean off of supplemental oxygen, scheduled neb treatments    Consultants:  None  Procedures:  None  Antimicrobials:  Ceftriaxone 5/7>>   Subjective: Patient seen examined at bedside, sitting at edge of bed with nasal cannula out of her nose.  RN present.  Continues with intermittent bouts of anxiety.  Has been calling for pain medicine and her anxiety medicine around-the-clock per RN.  Review of chart notable for [facility] admission in 2011 for hallucinations and questionable suicidal ideation in which she was started on Geodon, although not on any mood stabilizers currently outpatient.  Discussed with patient believe her anxiety playing a large role into her multitude of symptoms and will start Zoloft today, patient within agreement.  Patient continues to reiterate to make sure that her "sleeping medicine" is available tonight. No other specific questions or concerns at this time.  Denies headache, no dizziness, no chest pain, no palpitations, no fever/chills/night sweats, no nausea/vomiting/diarrhea, no focal weakness, no fatigue, no paresthesias.  No acute events overnight per nurse staff.  Objective: Vitals:   10/04/22 1841 10/04/22 2120 10/05/22 0402 10/05/22 0759  BP: 119/68 112/69 127/80   Pulse: 71 64 (!) 58   Resp: 17 15 19    Temp: 98.4 F (36.9 C)  97.9 F (36.6 C)   TempSrc: Oral  Oral   SpO2: (!) 89% 99% 100% 100%  Weight:      Height:        Intake/Output Summary (Last 24 hours) at 10/05/2022 1045 Last data filed at 10/05/2022 1610 Gross per 24 hour  Intake 1300 ml  Output --  Net 1300 ml   Filed Weights   10/02/22 1843  Weight: 44.5 kg    Examination:  Physical Exam: GEN: NAD, alert and oriented x 3, chronically ill appearance, appears older than stated age HEENT: NCAT, PERRL, EOMI, sclera clear, MMM PULM: Mild mid-to-late expiratory wheezing wheezing throughout all lung fields, no  crackles, normal respiratory effort without accessory muscle use, currently on 2 L nasal cannula with SpO2 100 percent at rest CV: RRR w/o M/G/R GI: abd soft, NTND, NABS, no R/G/M MSK: no peripheral edema, moves all extremities independently NEURO: CN II-XII intact, no focal deficits, sensation to light touch intact PSYCH: Anxious mood Integumentary: dry/intact, no rashes or wounds    Data Reviewed: I have personally reviewed following labs and imaging studies  CBC: Recent Labs  Lab 10/02/22 1858 10/03/22 0051 10/03/22 0533  WBC 8.2 5.9 3.8*  HGB 11.7* 11.0* 10.4*  HCT 37.9 35.0* 33.3*  MCV 95.5 94.6 94.3  PLT 230 228 241   Basic Metabolic Panel: Recent Labs  Lab 10/02/22 1858 10/02/22 2020 10/02/22 2309 10/03/22 0051 10/03/22 0533 10/04/22 0524  NA 135  --   --   --  135 138  K 2.6*  --   --   --  2.9* 5.0  CL 96*  --   --   --  97* 103  CO2 29  --   --   --  30 31  GLUCOSE 153*  --   --   --  174* 115*  BUN 9  --   --   --  9 16  CREATININE 0.58  --   --  0.60 0.53 0.62  CALCIUM 8.1*  --   --   --  8.2* 8.6*  MG  --  10.4* 2.3  --   --  2.3   GFR: Estimated Creatinine Clearance: 46.6 mL/min (by C-G formula based on SCr of 0.62 mg/dL). Liver Function Tests: Recent Labs  Lab 10/03/22 0533  AST 18  ALT 11  ALKPHOS 44  BILITOT 0.4  PROT 6.3*  ALBUMIN 3.4*   No results for input(s): "LIPASE", "AMYLASE" in the last 168 hours. No results for input(s): "AMMONIA" in the last 168 hours. Coagulation Profile: No results for input(s): "INR", "PROTIME" in the last 168 hours. Cardiac Enzymes: No results for input(s): "CKTOTAL", "CKMB", "CKMBINDEX", "TROPONINI" in the last 168 hours. BNP (last 3 results) No results for input(s): "PROBNP" in the last 8760 hours. HbA1C: No results for input(s): "HGBA1C" in the last 72 hours. CBG: Recent Labs  Lab 10/04/22 1513  GLUCAP 92   Lipid Profile: No results for input(s): "CHOL", "HDL", "LDLCALC", "TRIG", "CHOLHDL",  "LDLDIRECT" in the last 72 hours. Thyroid Function Tests: No results for input(s): "TSH", "T4TOTAL", "FREET4", "T3FREE", "THYROIDAB" in the last 72 hours. Anemia Panel: No results for input(s): "VITAMINB12", "FOLATE", "FERRITIN", "TIBC", "IRON", "RETICCTPCT" in the last 72 hours. Sepsis Labs: No results for input(s): "PROCALCITON", "LATICACIDVEN" in the last 168 hours.  No results found for this or any previous visit (from the past 240 hour(s)).       Radiology Studies: No results found.      Scheduled Meds:  arformoterol  15 mcg Nebulization BID   budesonide (PULMICORT) nebulizer solution  0.5 mg Nebulization BID   enoxaparin (LOVENOX) injection  30 mg Subcutaneous Q24H   feeding supplement  237 mL Oral TID BM   methylPREDNISolone (SOLU-MEDROL) injection  40 mg Intravenous Q12H   multivitamin with minerals  1 tablet Oral Daily   [START ON 10/06/2022] revefenacin  175 mcg Nebulization Daily   sertraline  50 mg Oral Daily   Continuous Infusions:  cefTRIAXone (ROCEPHIN)  IV 200 mL/hr at 10/05/22 0839     LOS: 3 days    Time spent: 53 minutes spent on chart review, discussion with nursing staff, consultants, updating family and interview/physical exam; more than 50% of that time was spent in counseling and/or coordination of care.    Alvira Philips [name], DO [facility] Available via Epic secure chat 7am-7pm After these hours, please refer to coverage provider listed on [email or web address] 10/05/2022, 10:45 AM

## 2022-10-05 NOTE — Care Management Important Message (Signed)
Important Message  Patient Details IM Letter given Name: [name] MRN: [id number] Date of Birth: [birth year]   Medicare Important Message Given:  Yes     Caren Macadam 10/05/2022, 1:43 PM

## 2022-10-06 ENCOUNTER — Inpatient Hospital Stay (HOSPITAL_COMMUNITY): Payer: Medicare HMO

## 2022-10-06 DIAGNOSIS — J9602 Acute respiratory failure with hypercapnia: Secondary | ICD-10-CM

## 2022-10-06 DIAGNOSIS — J441 Chronic obstructive pulmonary disease with (acute) exacerbation: Secondary | ICD-10-CM | POA: Diagnosis not present

## 2022-10-06 LAB — BLOOD GAS, ARTERIAL
Acid-Base Excess: 9 mmol/L — ABNORMAL HIGH (ref 0.0–2.0)
Bicarbonate: 38.1 mmol/L — ABNORMAL HIGH (ref 20.0–28.0)
Delivery systems: POSITIVE
Drawn by: 37588
Expiratory PAP: 8 cmH2O
FIO2: 70 %
Inspiratory PAP: 16 cmH2O
O2 Saturation: 100 %
Patient temperature: 36.4
RATE: 12 resp/min
pCO2 arterial: 72 mmHg (ref 32–48)
pH, Arterial: 7.33 — ABNORMAL LOW (ref 7.35–7.45)
pO2, Arterial: 193 mmHg — ABNORMAL HIGH (ref 83–108)

## 2022-10-06 LAB — BASIC METABOLIC PANEL
Anion gap: 8 (ref 5–15)
BUN: 21 mg/dL (ref 8–23)
CO2: 33 mmol/L — ABNORMAL HIGH (ref 22–32)
Calcium: 8.8 mg/dL — ABNORMAL LOW (ref 8.9–10.3)
Chloride: 97 mmol/L — ABNORMAL LOW (ref 98–111)
Creatinine, Ser: 0.54 mg/dL (ref 0.44–1.00)
GFR, Estimated: >60 mL/min (ref >60)
Glucose, Bld: 123 mg/dL — ABNORMAL HIGH (ref 70–99)
Potassium: 4.3 mmol/L (ref 3.5–5.1)
Sodium: 138 mmol/L (ref 135–145)

## 2022-10-06 LAB — GLUCOSE, CAPILLARY: Glucose-Capillary: 116 mg/dL — ABNORMAL HIGH (ref 70–99)

## 2022-10-06 MED ORDER — LORAZEPAM 2 MG/ML IJ SOLN
1.0000 mg | Freq: Once | INTRAMUSCULAR | Status: DC
  Filled 2022-10-06: qty 1

## 2022-10-06 MED ORDER — ORAL CARE MOUTH RINSE
15.0000 mL | OROMUCOSAL | Status: DC
  Administered 2022-10-06 – 2022-10-11 (×13): 15 mL via OROMUCOSAL

## 2022-10-06 MED ORDER — ZOLPIDEM TARTRATE 5 MG PO TABS
5.0000 mg | ORAL_TABLET | Freq: Every evening | ORAL | Status: AC | PRN
  Administered 2022-10-06: 5 mg via ORAL
  Filled 2022-10-06: qty 1

## 2022-10-06 MED ORDER — METHYLPREDNISOLONE SODIUM SUCC 125 MG IJ SOLR
125.0000 mg | Freq: Two times a day (BID) | INTRAMUSCULAR | Status: DC
  Administered 2022-10-06 – 2022-10-08 (×4): 125 mg via INTRAVENOUS
  Filled 2022-10-06 (×4): qty 2

## 2022-10-06 MED ORDER — CHLORHEXIDINE GLUCONATE CLOTH 2 % EX PADS
6.0000 | MEDICATED_PAD | Freq: Every day | CUTANEOUS | Status: DC
  Administered 2022-10-06 – 2022-10-10 (×5): 6 via TOPICAL

## 2022-10-06 MED ORDER — METHYLPREDNISOLONE SODIUM SUCC 125 MG IJ SOLR
125.0000 mg | Freq: Every day | INTRAMUSCULAR | Status: DC
  Administered 2022-10-06: 125 mg via INTRAVENOUS
  Filled 2022-10-06: qty 2

## 2022-10-06 MED ORDER — ORAL CARE MOUTH RINSE: 15.0000 mL | OROMUCOSAL | Status: DC | PRN

## 2022-10-06 MED ORDER — MORPHINE SULFATE (PF) 2 MG/ML IV SOLN
2.0000 mg | INTRAVENOUS | Status: DC | PRN
  Administered 2022-10-06 – 2022-10-11 (×19): 2 mg via INTRAVENOUS
  Filled 2022-10-06 (×20): qty 1

## 2022-10-06 MED ORDER — HYDRALAZINE HCL 25 MG PO TABS
25.0000 mg | ORAL_TABLET | Freq: Four times a day (QID) | ORAL | Status: DC | PRN
  Administered 2022-10-11: 25 mg via ORAL
  Filled 2022-10-06: qty 1

## 2022-10-06 NOTE — Progress Notes (Signed)
PROGRESS NOTE    [name]  GNF:[id number] DOB: [birth year] DOA: 10/02/2022 PCP: Patient, No Pcp Per    Brief Narrative:   [name] is a 67 y.o. female with past medical history significant for COPD/asthma, HTN, chronic pain syndrome, OA, tobacco use disorder who presented to [facility] ED from home via EMS on 5/7 with progressive shortness of breath, cough.  Onset last 2-3 days.  Initially endorses some chest pains/tightness.  Denies fever/chills, no sick contacts.  On EMS arrival, patient was noted to be tachypneic with wheezing and received DuoNebs x 3 and IV Solu-Medrol; placed on BiPAP and patient was subsequently transported to the ED for further evaluation.  In the ED, temperature 98.3 F, HR 93, RR 17, BP 127/68, SpO2 100% on BiPAP.  WBC 8.2, hemoglobin 11.7, platelet count 230.  Sodium 135, potassium 2.6, chloride 96, CO2 29, glucose 153, BUN 9, creatinine 0.58.  High sensitive troponin 5 followed by 6.  Chest x-ray with increased bronchial thickening from prior exam, may be acute bronchitis or COPD exacerbation, mild to advanced emphysema.  Patient received IV steroids, DuoNeb, IV potassium, albuterol neb, fentanyl, and morphine.  EDP consulted [facility] for admission for further evaluation management of acute respiratory failure secondary to COPD exacerbation requiring BiPAP.  Assessment & Plan:    COPD exacerbation Acute hypoxic/hypercarbic respiratory failure Patient presenting to ED with progressive shortness of breath, wheezing.  Denies sick contacts.  Patient is afebrile without leukocytosis.  Chest x-ray with increased bronchial thickening from prior exam without focal consolidation.  Patient received IV steroids, 3 nebs by EMS and was placed on BiPAP and transported to the ED for further evaluation.  Patient was eventually titrated off of BiPAP to nasal cannula.  On 10/06/2022, patient developed increased respiratory distress, confusion and was transferred to the  stepdown unit.  ABG with pH 7.33, pCO2 72, pO2 193. -- Transfer to stepdown unit, start BiPAP -- PCCM consulted, appreciate assistance -- Increase Solu-Medrol to 125 mg IV daily -- Ceftriaxone IV every 24 hours x 5 days -- Brovana neb twice daily -- Budesonide neb twice daily -- Yupelri 175 mcg neb daily -- Albuterol neb every 2 hours as needed shortness of breath/wheezing -- Continue supplemental oxygen, maintain SpO2 > 88%; currently on BiPAP with FiO2  Hypokalemia: Resolved Repleted during hospitalization.  Essential hypertension Home regimen includes enalapril-HCTZ 10-25 mg p.o. daily. -- Continue to monitor off antihypertensives as blood pressure currently well-controlled  Chronic pain syndrome -- Continue home regimen Percocet/oxycodone  GERD -- Protonix 80 mg p.o. daily as needed for heartburn/indigestion  Tobacco use disorder Counseled on need for complete cessation/abstinence.  Anxiety Hx Mood disorder NOS Last hospitalized at [facility] for concern of auditory/visual hallucinations and questionable suicidal ideation in 2011.  Was initially started on Abilify but side effects of nausea/dizziness and was changed to Geodon.  Currently not on any anxiety, depressive or mood stabilizer currently.  During this hospital course, patient has had multiple fluctuations in anxiety. -- Start Zoloft 50 mg p.o. daily -- Consider starting previous use of Geodon at a lower dose nightly depending on response to Zoloft -- Ativan 1 mg p.o. every 6 hours as needed anxiety -- Haldol as needed agitation  Severe protein calorie malnutrition Body mass index is 18.52 kg/m.  Nutrition Status: Nutrition Problem: Severe Malnutrition Etiology: chronic illness (COPD) Signs/Symptoms: severe fat depletion, severe muscle depletion Interventions: Ensure Enlive (each supplement provides 350kcal and 20 grams of protein), MVI, Magic cup -- Dietitian  following, diet liberalized to regular, continue to  encourage increased oral intake, supplementation  DVT prophylaxis: enoxaparin (LOVENOX) injection 30 mg Start: 10/04/22 1000    Code Status: Full Code Family Communication: No family present at bedside this morning  Disposition Plan:  Level of care: Stepdown Status is: Inpatient Remains inpatient appropriate because: Transferring to STU due to increased respiratory distress, BiPAP    Consultants:  PCCM  Procedures:  None  Antimicrobials:  Ceftriaxone 5/7>>   Subjective: Patient seen examined at bedside, lying in bed, somnolent as she received Haldol earlier this morning.  Remains on 3 L nasal cannula with SpO2 97%.  Discussed with RN this morning, has had increased agitation overnight; asking for Ativan and her pain medication around-the-clock.  Patient denied headache, no chest pain, no abdominal pain, no fever.   Rapid response initiated later this morning after patient was trying to utilize the bedside commode with increased work of breathing.  RT was at bedside and noted her SpO2 was 77% on her 3 L nasal cannula and patient was given an albuterol treatment.  Return to bedside and patient was notably dyspneic utilizing accessory muscles and subsequently patient was transferred to the stepdown unit and placed on BiPAP.  Discussed with PCCM, Dr. Vassie Loll.  Solu-Medrol was increased to 125 mg IV daily.  Patient appears more comfortable currently on the BiPAP.  Objective: Vitals:   10/06/22 0837 10/06/22 0948 10/06/22 0956 10/06/22 1014  BP:   (!) 165/91 123/73  Pulse:   79 69  Resp:   (!) 24 13  Temp:      TempSrc:      SpO2: 96% (!) 77% 100% 100%  Weight:      Height:        Intake/Output Summary (Last 24 hours) at 10/06/2022 1049 Last data filed at 10/05/2022 1245 Gross per 24 hour  Intake 240 ml  Output --  Net 240 ml   Filed Weights   10/02/22 1843  Weight: 44.5 kg    Examination:  Physical Exam: GEN: Respiratory distress, ill in appearance, appears older than  stated age HEENT: NCAT, PERRL, EOMI, sclera clear, MMM, poor dentition PULM: expiratory wheezing wheezing throughout all lung fields, no crackles, increased respiratory effort with accessory muscle use, currently on 3 L nasal cannula being transitioned to BiPAP  CV: RRR w/o M/G/R GI: abd soft, NTND, NABS, no R/G/M MSK: no peripheral edema, moves all extremities independently NEURO: No focal neurological deficits PSYCH: Anxious mood Integumentary: No appreciable rashes/lesions/wounds noted on exposed skin surfaces    Data Reviewed: I have personally reviewed following labs and imaging studies  CBC: Recent Labs  Lab 10/02/22 1858 10/03/22 0051 10/03/22 0533  WBC 8.2 5.9 3.8*  HGB 11.7* 11.0* 10.4*  HCT 37.9 35.0* 33.3*  MCV 95.5 94.6 94.3  PLT 230 228 241   Basic Metabolic Panel: Recent Labs  Lab 10/02/22 1858 10/02/22 2020 10/02/22 2309 10/03/22 0051 10/03/22 0533 10/04/22 0524 10/06/22 0713  NA 135  --   --   --  135 138 138  K 2.6*  --   --   --  2.9* 5.0 4.3  CL 96*  --   --   --  97* 103 97*  CO2 29  --   --   --  30 31 33*  GLUCOSE 153*  --   --   --  174* 115* 123*  BUN 9  --   --   --  9 16 21   CREATININE  0.58  --   --  0.60 0.53 0.62 0.54  CALCIUM 8.1*  --   --   --  8.2* 8.6* 8.8*  MG  --  10.4* 2.3  --   --  2.3  --    GFR: Estimated Creatinine Clearance: 46.6 mL/min (by C-G formula based on SCr of 0.54 mg/dL). Liver Function Tests: Recent Labs  Lab 10/03/22 0533  AST 18  ALT 11  ALKPHOS 44  BILITOT 0.4  PROT 6.3*  ALBUMIN 3.4*   No results for input(s): "LIPASE", "AMYLASE" in the last 168 hours. No results for input(s): "AMMONIA" in the last 168 hours. Coagulation Profile: No results for input(s): "INR", "PROTIME" in the last 168 hours. Cardiac Enzymes: No results for input(s): "CKTOTAL", "CKMB", "CKMBINDEX", "TROPONINI" in the last 168 hours. BNP (last 3 results) No results for input(s): "PROBNP" in the last 8760 hours. HbA1C: No results  for input(s): "HGBA1C" in the last 72 hours. CBG: Recent Labs  Lab 10/04/22 1513  GLUCAP 92   Lipid Profile: No results for input(s): "CHOL", "HDL", "LDLCALC", "TRIG", "CHOLHDL", "LDLDIRECT" in the last 72 hours. Thyroid Function Tests: No results for input(s): "TSH", "T4TOTAL", "FREET4", "T3FREE", "THYROIDAB" in the last 72 hours. Anemia Panel: No results for input(s): "VITAMINB12", "FOLATE", "FERRITIN", "TIBC", "IRON", "RETICCTPCT" in the last 72 hours. Sepsis Labs: No results for input(s): "PROCALCITON", "LATICACIDVEN" in the last 168 hours.  No results found for this or any previous visit (from the past 240 hour(s)).       Radiology Studies: No results found.      Scheduled Meds:  arformoterol  15 mcg Nebulization BID   budesonide (PULMICORT) nebulizer solution  0.5 mg Nebulization BID   Chlorhexidine Gluconate Cloth  6 each Topical Daily   enoxaparin (LOVENOX) injection  30 mg Subcutaneous Q24H   feeding supplement  237 mL Oral TID BM   LORazepam  1 mg Intravenous Once   methylPREDNISolone (SOLU-MEDROL) injection  125 mg Intravenous Daily   multivitamin with minerals  1 tablet Oral Daily   mouth rinse  15 mL Mouth Rinse 4 times per day   revefenacin  175 mcg Nebulization Daily   sertraline  50 mg Oral Daily   Continuous Infusions:  cefTRIAXone (ROCEPHIN)  IV 1 g (10/05/22 2352)     LOS: 4 days    Critical Care Time Upon my evaluation, this patient had a high probability of imminent or life-threatening deterioration due to respiratory distress/compromise requiring transfer to the stepdown unit and placed on BiPAP, which required my direct attention, intervention, and personal management.  I have personally provided 74 minutes of critical care time exclusive of my time spent on separately billable procedures.  Time includes review of laboratory data, radiology results, discussion with consultants, and monitoring for potential decompensation.       Alvira Philips  [name], DO [facility] Available via Epic secure chat 7am-7pm After these hours, please refer to coverage provider listed on [email or web address] 10/06/2022, 10:49 AM

## 2022-10-06 NOTE — Consult Note (Signed)
NAME:  [name], MRN:  [id number], DOB:  [birth year], LOS: 4 ADMISSION DATE:  10/02/2022, CONSULTATION DATE:  10/06/2022  REFERRING MD:  [name] [facility], CHIEF COMPLAINT: Respiratory distress  History of Present Illness:  66 year old with COPD/asthma admitted 5/7 with shortness of breath and cough for 3 days.  Initially required BiPAP in the emergency room, treated for COPD exacerbation with IV steroids, budesonide/Brovana/Yupelri combination and IV ceftriaxone.  She was felt to be improving by 5/10 and was able to ambulate without oxygen but developed sudden respiratory distress on 5/11, requiring rapid response and transfer to ICU for BiPAP  Pertinent  Medical History  Chronic pain on narcotics Anxiety/psychosis COPD/asthma -PFTs 2018 show moderate airway obstruction with FEV1 67%, improved to 80% postbronchodilator, DLCO 45%.    Significant Hospital Events: Including procedures, antibiotic start and stop dates in addition to other pertinent events     Interim History / Subjective:  Brought in to ICU and extremis and placed on BiPAP  Objective   Blood pressure 123/73, pulse (!) 56, temperature 98.1 F (36.7 C), temperature source Oral, resp. rate 14, height 5\' 1"  (1.549 m), weight 44.5 kg, SpO2 100 %.    FiO2 (%):  [40 %] 40 %   Intake/Output Summary (Last 24 hours) at 10/06/2022 1052 Last data filed at 10/05/2022 1245 Gross per 24 hour  Intake 240 ml  Output --  Net 240 ml   Filed Weights   10/02/22 1843  Weight: 44.5 kg    Examination: General: Thin woman, moderate respiratory distress HENT: No pallor, icterus, no JVD Lungs: Bilateral diffuse rhonchi, accessory muscle use Cardiovascular: S1-S2 tacky, no murmur Abdomen: Soft, nontender, no organomegaly Extremities: No asterixis, no deformity, no edema Neuro: Awake, not able to speak due to BiPAP but follows one-step commands  ABG shows acute respiratory acidosis. Labs show normal electrolytes, no leukocytosis,  stable anemia Chest x-ray shows hyperinflation, chronic, no infiltrates or effusions  Resolved Hospital Problem list     Assessment & Plan:  Acute on chronic hypercarbic respiratory failure Severe emphysema/hyperinflation COPD/asthma -some reversibility on previous PFTs  -BiPAP for work of breathing hopefully we can avoid intubation here Sudden worsening indicates component of anxiety worsening hyperinflation and auto PEEP -May need anxiety medications here -Budesonide/Brovana/Yupelri regimen with albuterol for breakthrough -Increase IV Solu-Medrol to 125 every 12  Best Practice (right click and "Reselect all SmartList Selections" daily)   Diet/type: NPO DVT prophylaxis: LMWH GI prophylaxis: N/A Lines: N/A Foley:  N/A Code Status:  full code Last date of multidisciplinary goals of care discussion [Full code]  Labs   CBC: Recent Labs  Lab 10/02/22 1858 10/03/22 0051 10/03/22 0533  WBC 8.2 5.9 3.8*  HGB 11.7* 11.0* 10.4*  HCT 37.9 35.0* 33.3*  MCV 95.5 94.6 94.3  PLT 230 228 241    Basic Metabolic Panel: Recent Labs  Lab 10/02/22 1858 10/02/22 2020 10/02/22 2309 10/03/22 0051 10/03/22 0533 10/04/22 0524 10/06/22 0713  NA 135  --   --   --  135 138 138  K 2.6*  --   --   --  2.9* 5.0 4.3  CL 96*  --   --   --  97* 103 97*  CO2 29  --   --   --  30 31 33*  GLUCOSE 153*  --   --   --  174* 115* 123*  BUN 9  --   --   --  9 16 21   CREATININE 0.58  --   --  0.60 0.53 0.62 0.54  CALCIUM 8.1*  --   --   --  8.2* 8.6* 8.8*  MG  --  10.4* 2.3  --   --  2.3  --    GFR: Estimated Creatinine Clearance: 46.6 mL/min (by C-G formula based on SCr of 0.54 mg/dL). Recent Labs  Lab 10/02/22 1858 10/03/22 0051 10/03/22 0533  WBC 8.2 5.9 3.8*    Liver Function Tests: Recent Labs  Lab 10/03/22 0533  AST 18  ALT 11  ALKPHOS 44  BILITOT 0.4  PROT 6.3*  ALBUMIN 3.4*   No results for input(s): "LIPASE", "AMYLASE" in the last 168 hours. No results for input(s):  "AMMONIA" in the last 168 hours.  ABG    Component Value Date/Time   PHART 7.33 (L) 10/06/2022 1013   PCO2ART 72 (HH) 10/06/2022 1013   PO2ART 193 (H) 10/06/2022 1013   HCO3 38.1 (H) 10/06/2022 1013   TCO2 33 12/04/2015 1408   O2SAT 100 10/06/2022 1013     Coagulation Profile: No results for input(s): "INR", "PROTIME" in the last 168 hours.  Cardiac Enzymes: No results for input(s): "CKTOTAL", "CKMB", "CKMBINDEX", "TROPONINI" in the last 168 hours.  HbA1C: Hgb A1c MFr Bld  Date/Time Value Ref Range Status  11/07/2016 02:06 PM 5.6 4.8 - 5.6 % Final    Comment:    (NOTE)         Pre-diabetes: 5.7 - 6.4         Diabetes: >6.4         Glycemic control for adults with diabetes: <7.0     CBG: Recent Labs  Lab 10/04/22 1513  GLUCAP 92    Review of Systems:   Unable to obtain due to respiratory extremis  Past Medical History:  She,  has a past medical history of Arthritis, Asthma, Chest pain (2006), Chest pain (10/2016), Chronic back pain, Hypertension, Sciatica, and Shortness of breath.   Surgical History:   Past Surgical History:  Procedure Laterality Date   ABDOMINAL HYSTERECTOMY  1997   CARDIAC CATHETERIZATION  2006   HEMI-MICRODISCECTOMY LUMBAR LAMINECTOMY LEVEL 1  05/14/2012   Procedure: HEMI-MICRODISCECTOMY LUMBAR LAMINECTOMY LEVEL 1;  Surgeon: Jacki Cones, MD;  Location: [facility];  Service: Orthopedics;  Laterality: N/A;  Hemi Laminectomy Microdiscectomy L4 - L5 Central (X-Ray)   ingrown toenail removal     TONSILLECTOMY     TOTAL HIP ARTHROPLASTY Left 11/05/2013   Procedure: LEFT TOTAL HIP ARTHROPLASTY;  Surgeon: Jacki Cones, MD;  Location: [facility];  Service: Orthopedics;  Laterality: Left;     Social History:   reports that she has been smoking cigarettes. She has a 3.00 pack-year smoking history. She has never used smokeless tobacco. She reports that she does not drink alcohol and does not use drugs.   Family History:  Her family history includes  Diabetes type II in her sister; Heart attack (age of onset: 13) in her mother; Heart attack (age of onset: 66) in her sister; Hypertension in her sister and sister; Lung cancer in her sister; Tongue cancer in her brother.   Allergies No Known Allergies   Home Medications  Prior to Admission medications   Medication Sig Start Date End Date Taking? Authorizing Provider  albuterol (PROVENTIL) (2.5 MG/3ML) 0.083% nebulizer solution Take 3 mLs (2.5 mg total) by nebulization every 6 (six) hours as needed for wheezing or shortness of breath. 06/10/21  Yes [name], PA-C  albuterol (VENTOLIN HFA) 108 (90 Base) MCG/ACT inhaler Inhale  2 puffs into the lungs every 4 (four) hours as needed for wheezing or shortness of breath. 06/10/21  Yes [name], PA-C  atorvastatin (LIPITOR) 10 MG tablet Take 10 mg by mouth at bedtime. 09/07/22  Yes [provider]  enalapril (VASOTEC) 10 MG tablet Take 10 mg by mouth daily. 09/12/22  Yes [provider]  fluticasone-salmeterol (ADVAIR DISKUS) 250-50 MCG/ACT AEPB 1 inhalation twice daily followed by cleaning teeth and tongue Patient taking differently: Inhale 1 puff into the lungs in the morning and at bedtime. 06/16/21  Yes Julieanne Manson, MD  hydrochlorothiazide (HYDRODIURIL) 25 MG tablet Take 25 mg by mouth daily. 09/12/22  Yes [provider]  lidocaine (LIDODERM) 5 % Place 1 patch onto the skin daily. Remove & Discard patch within 12 hours or as directed by MD 12/01/21  Yes Achille Rich, PA-C  Multiple Vitamin (MULTIVITAMIN) capsule Take 1 capsule by mouth daily.   Yes [provider]  omeprazole (PRILOSEC) 40 MG capsule 1 cap by mouth in morning daily 1/2 hour before breakfast or other meds. Patient taking differently: Take 40 mg by mouth daily as needed (for acid reflux). 06/16/21  Yes Julieanne Manson, MD  oxyCODONE-acetaminophen (PERCOCET) 10-325 MG tablet Take 1 tablet by mouth every 4 (four) hours as  needed for pain. 12/23/20  Yes [provider]  zolpidem (AMBIEN) 5 MG tablet Take 5 mg by mouth at bedtime. 12/25/20  Yes [provider]  enalapril-hydrochlorothiazide (VASERETIC) 10-25 MG tablet TAKE 1 TABLET BY MOUTH DAILY Patient not taking: Reported on 10/05/2022 12/08/21   Julieanne Manson, MD     Critical care time: 67m       Cyril Mourning MD. [facility] [place] [facility] Pager : [phone]  If no response to pager , please call [phone] until 7 pm After 7:00 pm call Elink  [phone]   10/06/2022

## 2022-10-06 NOTE — Progress Notes (Addendum)
Patient noted to be anxious and in respiratory distress at the end of her bed by RN. RN encouraged effective slow deep breathing to calm patient down. Transferred patient to bedside commode once patient visibly calm and in less respiratory distress. RN contacted RT to give PRN neb treatment.  RT recommended calling rapid response once RT assessed patient's respiratory demise. Rapid Response initiated.

## 2022-10-06 NOTE — Progress Notes (Signed)
Pt refused bipap. Machine remained bedside. No resp distress noted. Pt is resting at this time.

## 2022-10-07 DIAGNOSIS — J9622 Acute and chronic respiratory failure with hypercapnia: Secondary | ICD-10-CM | POA: Diagnosis not present

## 2022-10-07 DIAGNOSIS — J441 Chronic obstructive pulmonary disease with (acute) exacerbation: Secondary | ICD-10-CM | POA: Diagnosis not present

## 2022-10-07 DIAGNOSIS — J9621 Acute and chronic respiratory failure with hypoxia: Secondary | ICD-10-CM | POA: Diagnosis not present

## 2022-10-07 LAB — CBC
HCT: 38.1 % (ref 36.0–46.0)
Hemoglobin: 11.6 g/dL — ABNORMAL LOW (ref 12.0–15.0)
MCH: 29.4 pg (ref 26.0–34.0)
MCHC: 30.4 g/dL (ref 30.0–36.0)
MCV: 96.5 fL (ref 80.0–100.0)
Platelets: 273 10*3/uL (ref 150–400)
RBC: 3.95 MIL/uL (ref 3.87–5.11)
RDW: 13.7 % (ref 11.5–15.5)
WBC: 9.2 10*3/uL (ref 4.0–10.5)
nRBC: 0 % (ref 0.0–0.2)

## 2022-10-07 LAB — COMPREHENSIVE METABOLIC PANEL
ALT: 29 U/L (ref 0–44)
AST: 19 U/L (ref 15–41)
Albumin: 3.4 g/dL — ABNORMAL LOW (ref 3.5–5.0)
Alkaline Phosphatase: 39 U/L (ref 38–126)
Anion gap: 9 (ref 5–15)
BUN: 20 mg/dL (ref 8–23)
CO2: 32 mmol/L (ref 22–32)
Calcium: 8.9 mg/dL (ref 8.9–10.3)
Chloride: 97 mmol/L — ABNORMAL LOW (ref 98–111)
Creatinine, Ser: 0.61 mg/dL (ref 0.44–1.00)
GFR, Estimated: >60 mL/min (ref >60)
Glucose, Bld: 111 mg/dL — ABNORMAL HIGH (ref 70–99)
Potassium: 4.4 mmol/L (ref 3.5–5.1)
Sodium: 138 mmol/L (ref 135–145)
Total Bilirubin: 0.5 mg/dL (ref 0.3–1.2)
Total Protein: 6 g/dL — ABNORMAL LOW (ref 6.5–8.1)

## 2022-10-07 LAB — MAGNESIUM: Magnesium: 2.1 mg/dL (ref 1.7–2.4)

## 2022-10-07 LAB — BLOOD GAS, VENOUS
Acid-Base Excess: 10 mmol/L — ABNORMAL HIGH (ref 0.0–2.0)
Bicarbonate: 39 mmol/L — ABNORMAL HIGH (ref 20.0–28.0)
O2 Saturation: 74.1 %
Patient temperature: 36.1
pCO2, Ven: 71 mmHg (ref 44–60)
pH, Ven: 7.34 (ref 7.25–7.43)
pO2, Ven: 39 mmHg (ref 32–45)

## 2022-10-07 MED ORDER — ZOLPIDEM TARTRATE 5 MG PO TABS
7.5000 mg | ORAL_TABLET | Freq: Every day | ORAL | Status: DC
  Administered 2022-10-07 – 2022-10-10 (×4): 7.5 mg via ORAL
  Filled 2022-10-07 (×4): qty 2

## 2022-10-07 MED ORDER — BUSPIRONE HCL 5 MG PO TABS
15.0000 mg | ORAL_TABLET | Freq: Two times a day (BID) | ORAL | Status: DC
  Administered 2022-10-07 – 2022-10-10 (×6): 15 mg via ORAL
  Administered 2022-10-10: 5 mg via ORAL
  Administered 2022-10-11: 15 mg via ORAL
  Filled 2022-10-07: qty 1
  Filled 2022-10-07 (×6): qty 3
  Filled 2022-10-07: qty 1
  Filled 2022-10-07: qty 3

## 2022-10-07 NOTE — Progress Notes (Signed)
Pt is very anxious, Pt wanted me to stay with her in the room. No resp distress but pt is not getting comfortable. After talking to her for a while pt said she would let me place her on bipap. Pt is tolerating it well at this time. ( Alb neb was already started by RN). Pt is trying to sleep at this time. RN aware.

## 2022-10-07 NOTE — Progress Notes (Signed)
Patient anxious, wanted to leave and walk home. Patient educated about risks and dangers up to the possibility of death. Patient has chosen to stay.

## 2022-10-07 NOTE — Progress Notes (Signed)
NAME:  [name], MRN:  [id number], DOB:  16-Feb-1956, LOS: 5 ADMISSION DATE:  10/02/2022, CONSULTATION DATE:  10/07/2022  REFERRING MD:  [name] [facility], CHIEF COMPLAINT: Respiratory distress  History of Present Illness:  67 year old with COPD/asthma admitted 5/7 with shortness of breath and cough for 3 days.  Initially required BiPAP in the emergency room, treated for COPD exacerbation with IV steroids, budesonide/Brovana/Yupelri combination and IV ceftriaxone.  She was felt to be improving by 5/10 and was able to ambulate without oxygen but developed sudden respiratory distress on 5/11, requiring rapid response and transfer to ICU for BiPAP  Pertinent  Medical History  Chronic pain on narcotics Anxiety/psychosis COPD/asthma -PFTs 2018 show moderate airway obstruction with FEV1 67%, improved to 80% postbronchodilator, DLCO 45%.    Significant Hospital Events: Including procedures, antibiotic start and stop dates in addition to other pertinent events     Interim History / Subjective:   Some anxiety overnight Used BiPAP On nasal cannula this morning  Objective   Blood pressure (!) 148/74, pulse 93, temperature 98.2 F (36.8 C), temperature source Oral, resp. rate (!) 23, height 5\' 1"  (1.549 m), weight 44.5 kg, SpO2 94 %.    FiO2 (%):  [30 %-40 %] 30 %   Intake/Output Summary (Last 24 hours) at 10/07/2022 1056 Last data filed at 10/07/2022 0400 Gross per 24 hour  Intake --  Output 475 ml  Net -475 ml    Filed Weights   10/02/22 1843  Weight: 44.5 kg    Examination: General: Thin woman, no respiratory distress, anxious affect HENT: No pallor, icterus, no JVD Lungs: Barrel chest, bilateral diffuse rhonchi, no accessory muscle use Cardiovascular: S1-S2 regular, no murmur Abdomen: Soft, nontender, no organomegaly Extremities: No asterixis, no deformity, no edema Neuro: Awake, not able to speak due to BiPAP but follows one-step commands  ABG 5/11 acute respiratory  acidosis. VBG 5/12 shows compensated hypercarbia Labs show normal electrolytes, no leukocytosis, bicarb 32 Chest x-ray shows hyperinflation, chronic, no infiltrates or effusions  Resolved Hospital Problem list     Assessment & Plan:  Acute on chronic hypercarbic respiratory failure Severe emphysema/hyperinflation COPD/asthma -some reversibility on previous PFTs  -BiPAP as needed -Budesonide/Brovana/Yupelri regimen with albuterol for breakthrough -Increase IV Solu-Medrol to 125 every 12 -Discharge regimen can be Advair plus Spiriva or Trelegy 100 if this is covered on her insurance  Sudden worsening indicates component of anxiety worsening hyperinflation and auto PEEP -Will definitely need anxiety medications here -Okay to start buspirone and titrate during hospital stay, would be okay using low-dose benzodiazepine such as Xanax on discharge  She will need pulmonary office follow-up, would keep in the hospital for 1-2 more days  Best Practice (right click and "Reselect all SmartList Selections" daily)   Diet/type: Regular consistency (see orders) DVT prophylaxis: LMWH GI prophylaxis: N/A Lines: N/A Foley:  N/A Code Status:  full code Last date of multidisciplinary goals of care discussion [Full code]  Labs   CBC: Recent Labs  Lab 10/02/22 1858 10/03/22 0051 10/03/22 0533 10/07/22 0510  WBC 8.2 5.9 3.8* 9.2  HGB 11.7* 11.0* 10.4* 11.6*  HCT 37.9 35.0* 33.3* 38.1  MCV 95.5 94.6 94.3 96.5  PLT 230 228 241 273     Basic Metabolic Panel: Recent Labs  Lab 10/02/22 1858 10/02/22 2020 10/02/22 2309 10/03/22 0051 10/03/22 0533 10/04/22 0524 10/06/22 0713 10/07/22 0510  NA 135  --   --   --  135 138 138 138  K 2.6*  --   --   --  2.9* 5.0 4.3 4.4  CL 96*  --   --   --  97* 103 97* 97*  CO2 29  --   --   --  30 31 33* 32  GLUCOSE 153*  --   --   --  174* 115* 123* 111*  BUN 9  --   --   --  9 16 21 20   CREATININE 0.58  --   --  0.60 0.53 0.62 0.54 0.61  CALCIUM  8.1*  --   --   --  8.2* 8.6* 8.8* 8.9  MG  --  10.4* 2.3  --   --  2.3  --  2.1    GFR: Estimated Creatinine Clearance: 46.6 mL/min (by C-G formula based on SCr of 0.61 mg/dL). Recent Labs  Lab 10/02/22 1858 10/03/22 0051 10/03/22 0533 10/07/22 0510  WBC 8.2 5.9 3.8* 9.2     Liver Function Tests: Recent Labs  Lab 10/03/22 0533 10/07/22 0510  AST 18 19  ALT 11 29  ALKPHOS 44 39  BILITOT 0.4 0.5  PROT 6.3* 6.0*  ALBUMIN 3.4* 3.4*    No results for input(s): "LIPASE", "AMYLASE" in the last 168 hours. No results for input(s): "AMMONIA" in the last 168 hours.  ABG    Component Value Date/Time   PHART 7.33 (L) 10/06/2022 1013   PCO2ART 72 (HH) 10/06/2022 1013   PO2ART 193 (H) 10/06/2022 1013   HCO3 39.0 (H) 10/07/2022 0510   TCO2 33 12/04/2015 1408   O2SAT 74.1 10/07/2022 0510     Coagulation Profile: No results for input(s): "INR", "PROTIME" in the last 168 hours.  Cardiac Enzymes: No results for input(s): "CKTOTAL", "CKMB", "CKMBINDEX", "TROPONINI" in the last 168 hours.  HbA1C: Hgb A1c MFr Bld  Date/Time Value Ref Range Status  11/07/2016 02:06 PM 5.6 4.8 - 5.6 % Final    Comment:    (NOTE)         Pre-diabetes: 5.7 - 6.4         Diabetes: >6.4         Glycemic control for adults with diabetes: <7.0     CBG: Recent Labs  Lab 10/04/22 1513 10/06/22 1546  GLUCAP 92 116*      Cyril Mourning MD. FCCP. [facility] Pager : [phone]  If no response to pager , please call [phone] until 7 pm After 7:00 pm call Elink  [phone]   10/07/2022

## 2022-10-07 NOTE — Progress Notes (Signed)
Pt refused bipap. Machine remained bedside if needed. No resp distress at this time.

## 2022-10-07 NOTE — Progress Notes (Signed)
PROGRESS NOTE    [name]  ZOX:[id number] DOB: [birth year] DOA: 10/02/2022 PCP: Patient, No Pcp Per    Brief Narrative:   [name] is a 67 y.o. female with past medical history significant for COPD/asthma, HTN, chronic pain syndrome, OA, tobacco use disorder who presented to [facility] ED from home via EMS on 5/7 with progressive shortness of breath, cough.  Onset last 2-3 days.  Initially endorses some chest pains/tightness.  Denies fever/chills, no sick contacts.  On EMS arrival, patient was noted to be tachypneic with wheezing and received DuoNebs x 3 and IV Solu-Medrol; placed on BiPAP and patient was subsequently transported to the ED for further evaluation.  In the ED, temperature 98.3 F, HR 93, RR 17, BP 127/68, SpO2 100% on BiPAP.  WBC 8.2, hemoglobin 11.7, platelet count 230.  Sodium 135, potassium 2.6, chloride 96, CO2 29, glucose 153, BUN 9, creatinine 0.58.  High sensitive troponin 5 followed by 6.  Chest x-ray with increased bronchial thickening from prior exam, may be acute bronchitis or COPD exacerbation, mild to advanced emphysema.  Patient received IV steroids, DuoNeb, IV potassium, albuterol neb, fentanyl, and morphine.  EDP consulted [facility] for admission for further evaluation management of acute respiratory failure secondary to COPD exacerbation requiring BiPAP.  Assessment & Plan:    COPD exacerbation Acute hypoxic/hypercarbic respiratory failure Patient presenting to ED with progressive shortness of breath, wheezing.  Denies sick contacts.  Patient is afebrile without leukocytosis.  Chest x-ray with increased bronchial thickening from prior exam without focal consolidation.  Patient received IV steroids, 3 nebs by EMS and was placed on BiPAP and transported to the ED for further evaluation.  Patient was eventually titrated off of BiPAP to nasal cannula.  On 10/06/2022, patient developed increased respiratory distress, confusion and was transferred to the  stepdown unit.  ABG with pH 7.33, pCO2 72, pO2 193. -- Transfer to stepdown unit, start BiPAP -- PCCM consulted, appreciate assistance -- Solu-Medrol to 125 mg IV q12h -- Completed 5-day course of ceftriaxone -- Brovana neb twice daily -- Budesonide neb twice daily -- Yupelri 175 mcg neb daily -- Albuterol neb every 2 hours as needed shortness of breath/wheezing -- Continue supplemental oxygen, maintain SpO2 > 88%; currently on 4 L nasal cannula with SpO2 100% at rest -- BiPAP as needed, nightly  Hypokalemia: Resolved Repleted during hospitalization.  Essential hypertension Home regimen includes enalapril-HCTZ 10-25 mg p.o. daily. -- Continue to monitor off antihypertensives as blood pressure currently well-controlled  Chronic pain syndrome -- Continue home regimen Percocet/oxycodone  GERD -- Protonix 80 mg p.o. daily as needed for heartburn/indigestion  Tobacco use disorder Counseled on need for complete cessation/abstinence.  Anxiety Hx Mood disorder NOS Last hospitalized at [facility] for concern of auditory/visual hallucinations and questionable suicidal ideation in 2011.  Was initially started on Abilify but side effects of nausea/dizziness and was changed to Geodon.  Currently not on any anxiety, depressive or mood stabilizer currently.  During this hospital course, patient has had multiple fluctuations in anxiety. -- Started Zoloft 50 mg p.o. daily -- Started Buspar 15mg  PO BID -- Ativan 1 mg p.o. every 6 hours as needed anxiety -- Haldol as needed agitation  Severe protein calorie malnutrition Body mass index is 18.52 kg/m.  Nutrition Status: Nutrition Problem: Severe Malnutrition Etiology: chronic illness (COPD) Signs/Symptoms: severe fat depletion, severe muscle depletion Interventions: Ensure Enlive (each supplement provides 350kcal and 20 grams of protein), MVI, Magic cup -- Dietitian following, diet liberalized to  regular, continue to encourage increased oral intake,  supplementation  DVT prophylaxis: enoxaparin (LOVENOX) injection 30 mg Start: 10/04/22 1000    Code Status: Full Code Family Communication: No family present at bedside this morning  Disposition Plan:  Level of care: Stepdown Status is: Inpatient Remains inpatient appropriate because: IV steroids, BiPAP, IV antibiotics    Consultants:  PCCM  Procedures:  None  Antimicrobials:  Ceftriaxone 5/7>>   Subjective: Patient seen examined at bedside, lying in bed, RN present at bedside.  States "not doing well".  Continues with shortness of breath, anxious.  Initially refused BiPAP overnight but did agree to wear after midnight.  Currently on 4 L nasal cannula.  VBG with elevated CO2.  Started on BuSpar.  Will agree to wear BiPAP after she eats her breakfast again this morning.  Also discussed with her needs to wear it while she is sleeping.  No other specific complaints or concerns at this time.  Denies headache, no dizziness, no chest pain, no palpitations, no abdominal pain, no fever/chills/night sweats, no nausea/vomiting/diarrhea.   Objective: Vitals:   10/07/22 0700 10/07/22 0730 10/07/22 0826 10/07/22 0934  BP: (!) 148/74     Pulse:  93    Resp: 14 (!) 23    Temp:   98.2 F (36.8 C)   TempSrc:   Oral   SpO2:  93%  94%  Weight:      Height:        Intake/Output Summary (Last 24 hours) at 10/07/2022 1108 Last data filed at 10/07/2022 0400 Gross per 24 hour  Intake --  Output 475 ml  Net -475 ml   Filed Weights   10/02/22 1843  Weight: 44.5 kg    Examination:  Physical Exam: GEN: Respiratory distress, thin/chronically ill in appearance, appears older than stated age HEENT: NCAT, PERRL, EOMI, sclera clear, MMM, poor dentition PULM: Mid expiratory wheezing wheezing throughout all lung fields, no crackles, normal respiratory effort without accessory muscle use, currently on 4 L nasal cannula  CV: RRR w/o M/G/R GI: abd soft, NTND, NABS, no R/G/M MSK: no peripheral  edema, moves all extremities independently NEURO: No focal neurological deficits PSYCH: Anxious mood Integumentary: No appreciable rashes/lesions/wounds noted on exposed skin surfaces    Data Reviewed: I have personally reviewed following labs and imaging studies  CBC: Recent Labs  Lab 10/02/22 1858 10/03/22 0051 10/03/22 0533 10/07/22 0510  WBC 8.2 5.9 3.8* 9.2  HGB 11.7* 11.0* 10.4* 11.6*  HCT 37.9 35.0* 33.3* 38.1  MCV 95.5 94.6 94.3 96.5  PLT 230 228 241 273   Basic Metabolic Panel: Recent Labs  Lab 10/02/22 1858 10/02/22 2020 10/02/22 2309 10/03/22 0051 10/03/22 0533 10/04/22 0524 10/06/22 0713 10/07/22 0510  NA 135  --   --   --  135 138 138 138  K 2.6*  --   --   --  2.9* 5.0 4.3 4.4  CL 96*  --   --   --  97* 103 97* 97*  CO2 29  --   --   --  30 31 33* 32  GLUCOSE 153*  --   --   --  174* 115* 123* 111*  BUN 9  --   --   --  9 16 21 20   CREATININE 0.58  --   --  0.60 0.53 0.62 0.54 0.61  CALCIUM 8.1*  --   --   --  8.2* 8.6* 8.8* 8.9  MG  --  10.4* 2.3  --   --  2.3  --  2.1   GFR: Estimated Creatinine Clearance: 46.6 mL/min (by C-G formula based on SCr of 0.61 mg/dL). Liver Function Tests: Recent Labs  Lab 10/03/22 0533 10/07/22 0510  AST 18 19  ALT 11 29  ALKPHOS 44 39  BILITOT 0.4 0.5  PROT 6.3* 6.0*  ALBUMIN 3.4* 3.4*   No results for input(s): "LIPASE", "AMYLASE" in the last 168 hours. No results for input(s): "AMMONIA" in the last 168 hours. Coagulation Profile: No results for input(s): "INR", "PROTIME" in the last 168 hours. Cardiac Enzymes: No results for input(s): "CKTOTAL", "CKMB", "CKMBINDEX", "TROPONINI" in the last 168 hours. BNP (last 3 results) No results for input(s): "PROBNP" in the last 8760 hours. HbA1C: No results for input(s): "HGBA1C" in the last 72 hours. CBG: Recent Labs  Lab 10/04/22 1513 10/06/22 1546  GLUCAP 92 116*   Lipid Profile: No results for input(s): "CHOL", "HDL", "LDLCALC", "TRIG", "CHOLHDL",  "LDLDIRECT" in the last 72 hours. Thyroid Function Tests: No results for input(s): "TSH", "T4TOTAL", "FREET4", "T3FREE", "THYROIDAB" in the last 72 hours. Anemia Panel: No results for input(s): "VITAMINB12", "FOLATE", "FERRITIN", "TIBC", "IRON", "RETICCTPCT" in the last 72 hours. Sepsis Labs: No results for input(s): "PROCALCITON", "LATICACIDVEN" in the last 168 hours.  No results found for this or any previous visit (from the past 240 hour(s)).       Radiology Studies: DG CHEST PORT 1 VIEW  Result Date: 10/06/2022 CLINICAL DATA:  Respiratory distress. EXAM: PORTABLE CHEST 1 VIEW COMPARISON:  10/02/2022 FINDINGS: Lungs are hyperexpanded. The lungs are clear without focal pneumonia, edema, pneumothorax or pleural effusion. The cardiopericardial silhouette is within normal limits for size. Bones are diffusely demineralized. Telemetry leads overlie the chest. IMPRESSION: No active disease. Electronically Signed   By: Kennith Center M.D.   On: 10/06/2022 10:48        Scheduled Meds:  arformoterol  15 mcg Nebulization BID   budesonide (PULMICORT) nebulizer solution  0.5 mg Nebulization BID   busPIRone  15 mg Oral BID   Chlorhexidine Gluconate Cloth  6 each Topical Daily   enoxaparin (LOVENOX) injection  30 mg Subcutaneous Q24H   feeding supplement  237 mL Oral TID BM   LORazepam  1 mg Intravenous Once   methylPREDNISolone (SOLU-MEDROL) injection  125 mg Intravenous Q12H   multivitamin with minerals  1 tablet Oral Daily   mouth rinse  15 mL Mouth Rinse 4 times per day   revefenacin  175 mcg Nebulization Daily   sertraline  50 mg Oral Daily   Continuous Infusions:     LOS: 5 days    Time spent: 51 minutes spent on chart review, discussion with nursing staff, consultants, updating family and interview/physical exam; more than 50% of that time was spent in counseling and/or coordination of care.     Alvira Philips [name], DO [facility] Available via Epic secure chat  7am-7pm After these hours, please refer to coverage provider listed on [email or web address] 10/07/2022, 11:08 AM

## 2022-10-08 DIAGNOSIS — J383 Other diseases of vocal cords: Secondary | ICD-10-CM

## 2022-10-08 DIAGNOSIS — F419 Anxiety disorder, unspecified: Secondary | ICD-10-CM

## 2022-10-08 DIAGNOSIS — J441 Chronic obstructive pulmonary disease with (acute) exacerbation: Secondary | ICD-10-CM | POA: Diagnosis not present

## 2022-10-08 MED ORDER — ENALAPRIL MALEATE 10 MG PO TABS
5.0000 mg | ORAL_TABLET | Freq: Every day | ORAL | Status: DC
  Administered 2022-10-08 – 2022-10-10 (×2): 5 mg via ORAL
  Filled 2022-10-08: qty 1
  Filled 2022-10-08 (×4): qty 0.5

## 2022-10-08 MED ORDER — ACETAMINOPHEN 500 MG PO TABS
500.0000 mg | ORAL_TABLET | Freq: Once | ORAL | Status: AC | PRN
  Administered 2022-10-08: 500 mg via ORAL
  Filled 2022-10-08: qty 1

## 2022-10-08 MED ORDER — SODIUM CHLORIDE 3 % IN NEBU
4.0000 mL | INHALATION_SOLUTION | Freq: Two times a day (BID) | RESPIRATORY_TRACT | Status: AC
  Administered 2022-10-08 – 2022-10-11 (×6): 4 mL via RESPIRATORY_TRACT
  Filled 2022-10-08 (×6): qty 4

## 2022-10-08 MED ORDER — METHYLPREDNISOLONE SODIUM SUCC 125 MG IJ SOLR
80.0000 mg | Freq: Two times a day (BID) | INTRAMUSCULAR | Status: AC
  Administered 2022-10-08 – 2022-10-09 (×3): 80 mg via INTRAVENOUS
  Filled 2022-10-08 (×3): qty 2

## 2022-10-08 NOTE — Care Management Important Message (Signed)
Important Message  Patient Details IM Letter given. Name: [name] MRN: [id number] Date of Birth: 28-May-1956   Medicare Important Message Given:  Yes     Caren Macadam 10/08/2022, 2:13 PM

## 2022-10-08 NOTE — Plan of Care (Signed)
  Problem: Education: Goal: Knowledge of General Education information will improve Description: Including pain rating scale, medication(s)/side effects and non-pharmacologic comfort measures Outcome: Progressing   Problem: Clinical Measurements: Goal: Will remain free from infection Outcome: Progressing Goal: Diagnostic test results will improve Outcome: Progressing Goal: Cardiovascular complication will be avoided Outcome: Progressing   Problem: Nutrition: Goal: Adequate nutrition will be maintained Outcome: Progressing   Problem: Coping: Goal: Level of anxiety will decrease Outcome: Progressing   Problem: Elimination: Goal: Will not experience complications related to urinary retention Outcome: Progressing   Problem: Safety: Goal: Ability to remain free from injury will improve Outcome: Progressing

## 2022-10-08 NOTE — Progress Notes (Signed)
PROGRESS NOTE    [name]  MVH:[id number] DOB: 11/24/1955 DOA: 10/02/2022 PCP: Patient, No Pcp Per    Brief Narrative:   [name] is a 67 y.o. female with past medical history significant for COPD/asthma, HTN, chronic pain syndrome, OA, tobacco use disorder who presented to [facility] ED from home via EMS on 5/7 with progressive shortness of breath, cough.  Onset last 2-3 days.  Initially endorses some chest pains/tightness.  Denies fever/chills, no sick contacts.  On EMS arrival, patient was noted to be tachypneic with wheezing and received DuoNebs x 3 and IV Solu-Medrol; placed on BiPAP and patient was subsequently transported to the ED for further evaluation.  In the ED, temperature 98.3 F, HR 93, RR 17, BP 127/68, SpO2 100% on BiPAP.  WBC 8.2, hemoglobin 11.7, platelet count 230.  Sodium 135, potassium 2.6, chloride 96, CO2 29, glucose 153, BUN 9, creatinine 0.58.  High sensitive troponin 5 followed by 6.  Chest x-ray with increased bronchial thickening from prior exam, may be acute bronchitis or COPD exacerbation, mild to advanced emphysema.  Patient received IV steroids, DuoNeb, IV potassium, albuterol neb, fentanyl, and morphine.  EDP consulted [facility] for admission for further evaluation management of acute respiratory failure secondary to COPD exacerbation requiring BiPAP.  Assessment & Plan:    COPD exacerbation Acute hypoxic/hypercarbic respiratory failure Patient presenting to ED with progressive shortness of breath, wheezing.  Denies sick contacts.  Patient is afebrile without leukocytosis.  Chest x-ray with increased bronchial thickening from prior exam without focal consolidation.  Patient received IV steroids, 3 nebs by EMS and was placed on BiPAP and transported to the ED for further evaluation.  Patient was eventually titrated off of BiPAP to nasal cannula.  On 10/06/2022, patient developed increased respiratory distress, confusion and was transferred to the  stepdown unit.  ABG with pH 7.33, pCO2 72, pO2 193. -- PCCM following, appreciate assistance -- Decrease Solu-Medrol to 80 mg IV q12h -- Completed 5-day course of ceftriaxone -- Brovana neb twice daily -- Budesonide neb twice daily -- Yupelri 175 mcg neb daily -- Albuterol neb every 2 hours as needed shortness of breath/wheezing -- Continue supplemental oxygen, maintain SpO2 > 88%; currently on 4 L nasal cannula with SpO2 100% at rest -- BiPAP nightly; although has been refusing -- Transfer to telemetry  Hypokalemia: Resolved Repleted during hospitalization.  Essential hypertension Home regimen includes enalapril-HCTZ 10-25 mg p.o. daily. -- Restart enalapril at 5 mg p.o. daily today -- Continue monitor BP closely and adjust accordingly  Chronic pain syndrome -- Continue home regimen Percocet/oxycodone  GERD -- Protonix 80 mg p.o. daily as needed for heartburn/indigestion  Tobacco use disorder Counseled on need for complete cessation/abstinence.  Anxiety Hx Mood disorder NOS Last hospitalized at [facility] for concern of auditory/visual hallucinations and questionable suicidal ideation in 2011.  Was initially started on Abilify but side effects of nausea/dizziness and was changed to Geodon.  Currently not on any anxiety, depressive or mood stabilizer currently.  During this hospital course, patient has had multiple fluctuations in anxiety. -- Started Zoloft 50 mg p.o. daily -- Started Buspar 15mg  PO BID -- Ativan 1 mg p.o. every 6 hours as needed anxiety -- Haldol as needed agitation  Severe protein calorie malnutrition Body mass index is 18.52 kg/m.  Nutrition Status: Nutrition Problem: Severe Malnutrition Etiology: chronic illness (COPD) Signs/Symptoms: severe fat depletion, severe muscle depletion Interventions: Ensure Enlive (each supplement provides 350kcal and 20 grams of protein), MVI, Magic cup --  Dietitian following, diet liberalized to regular, continue to encourage  increased oral intake, supplementation  Weakness/debility/deconditioning/gait disturbance: -- Continue PT/OT efforts while inpatient   DVT prophylaxis: enoxaparin (LOVENOX) injection 30 mg Start: 10/04/22 1000    Code Status: Full Code Family Communication: No family present at bedside this morning  Disposition Plan:  Level of care: Telemetry Status is: Inpatient Remains inpatient appropriate because: IV steroids, BiPAP, IV antibiotics    Consultants:  PCCM  Procedures:  None  Antimicrobials:  Ceftriaxone 5/7>>   Subjective: Patient seen examined at bedside, lying in bed.continues to state "does not feel good"; with no specific/localizing complaints.  Asking when she can get her next morphine and oral narcotic dose.  Refused BiPAP overnight once again.  Remains on 4 L nasal cannula with SpO2 100% at rest.  Wheezing much improved.  Stable to transfer back to telemetry floor.  Discussed with RN.  No other specific complaints or concerns at this time.  Denies headache, no dizziness, no chest pain, no palpitations, no abdominal pain, no fever/chills/night sweats, no nausea/vomiting/diarrhea.  No acute concerns overnight per nursing staff.   Objective: Vitals:   10/08/22 0700 10/08/22 0800 10/08/22 0824 10/08/22 0839  BP: (!) 139/59 (!) 163/78 (!) 148/84   Pulse: (!) 54 (!) 58 (!) 57   Resp: 17 14 16 17   Temp:   98.2 F (36.8 C)   TempSrc:   Oral   SpO2: 100% 100% 100% 100%  Weight:      Height:        Intake/Output Summary (Last 24 hours) at 10/08/2022 0859 Last data filed at 10/08/2022 0500 Gross per 24 hour  Intake --  Output 275 ml  Net -275 ml   Filed Weights   10/02/22 1843  Weight: 44.5 kg    Examination:  Physical Exam: GEN: Respiratory distress, thin/chronically ill in appearance, appears older than stated age HEENT: NCAT, PERRL, EOMI, sclera clear, MMM, poor dentition PULM: Faint wheezing appreciated late upper lung fields, no crackles, normal  respiratory effort without accessory muscle use, currently on 4 L nasal cannula pO2 100% at rest CV: RRR w/o M/G/R GI: abd soft, NTND, NABS, no R/G/M MSK: no peripheral edema, moves all extremities independently NEURO: No focal neurological deficits PSYCH: Anxious mood Integumentary: No appreciable rashes/lesions/wounds noted on exposed skin surfaces    Data Reviewed: I have personally reviewed following labs and imaging studies  CBC: Recent Labs  Lab 10/02/22 1858 10/03/22 0051 10/03/22 0533 10/07/22 0510  WBC 8.2 5.9 3.8* 9.2  HGB 11.7* 11.0* 10.4* 11.6*  HCT 37.9 35.0* 33.3* 38.1  MCV 95.5 94.6 94.3 96.5  PLT 230 228 241 273   Basic Metabolic Panel: Recent Labs  Lab 10/02/22 1858 10/02/22 2020 10/02/22 2309 10/03/22 0051 10/03/22 0533 10/04/22 0524 10/06/22 0713 10/07/22 0510  NA 135  --   --   --  135 138 138 138  K 2.6*  --   --   --  2.9* 5.0 4.3 4.4  CL 96*  --   --   --  97* 103 97* 97*  CO2 29  --   --   --  30 31 33* 32  GLUCOSE 153*  --   --   --  174* 115* 123* 111*  BUN 9  --   --   --  9 16 21 20   CREATININE 0.58  --   --  0.60 0.53 0.62 0.54 0.61  CALCIUM 8.1*  --   --   --  8.2* 8.6* 8.8* 8.9  MG  --  10.4* 2.3  --   --  2.3  --  2.1   GFR: Estimated Creatinine Clearance: 46.6 mL/min (by C-G formula based on SCr of 0.61 mg/dL). Liver Function Tests: Recent Labs  Lab 10/03/22 0533 10/07/22 0510  AST 18 19  ALT 11 29  ALKPHOS 44 39  BILITOT 0.4 0.5  PROT 6.3* 6.0*  ALBUMIN 3.4* 3.4*   No results for input(s): "LIPASE", "AMYLASE" in the last 168 hours. No results for input(s): "AMMONIA" in the last 168 hours. Coagulation Profile: No results for input(s): "INR", "PROTIME" in the last 168 hours. Cardiac Enzymes: No results for input(s): "CKTOTAL", "CKMB", "CKMBINDEX", "TROPONINI" in the last 168 hours. BNP (last 3 results) No results for input(s): "PROBNP" in the last 8760 hours. HbA1C: No results for input(s): "HGBA1C" in the last 72  hours. CBG: Recent Labs  Lab 10/04/22 1513 10/06/22 1546  GLUCAP 92 116*   Lipid Profile: No results for input(s): "CHOL", "HDL", "LDLCALC", "TRIG", "CHOLHDL", "LDLDIRECT" in the last 72 hours. Thyroid Function Tests: No results for input(s): "TSH", "T4TOTAL", "FREET4", "T3FREE", "THYROIDAB" in the last 72 hours. Anemia Panel: No results for input(s): "VITAMINB12", "FOLATE", "FERRITIN", "TIBC", "IRON", "RETICCTPCT" in the last 72 hours. Sepsis Labs: No results for input(s): "PROCALCITON", "LATICACIDVEN" in the last 168 hours.  No results found for this or any previous visit (from the past 240 hour(s)).       Radiology Studies: DG CHEST PORT 1 VIEW  Result Date: 10/06/2022 CLINICAL DATA:  Respiratory distress. EXAM: PORTABLE CHEST 1 VIEW COMPARISON:  10/02/2022 FINDINGS: Lungs are hyperexpanded. The lungs are clear without focal pneumonia, edema, pneumothorax or pleural effusion. The cardiopericardial silhouette is within normal limits for size. Bones are diffusely demineralized. Telemetry leads overlie the chest. IMPRESSION: No active disease. Electronically Signed   By: Kennith Center M.D.   On: 10/06/2022 10:48        Scheduled Meds:  arformoterol  15 mcg Nebulization BID   budesonide (PULMICORT) nebulizer solution  0.5 mg Nebulization BID   busPIRone  15 mg Oral BID   Chlorhexidine Gluconate Cloth  6 each Topical Daily   enoxaparin (LOVENOX) injection  30 mg Subcutaneous Q24H   feeding supplement  237 mL Oral TID BM   LORazepam  1 mg Intravenous Once   methylPREDNISolone (SOLU-MEDROL) injection  125 mg Intravenous Q12H   multivitamin with minerals  1 tablet Oral Daily   mouth rinse  15 mL Mouth Rinse 4 times per day   revefenacin  175 mcg Nebulization Daily   sertraline  50 mg Oral Daily   zolpidem  7.5 mg Oral QHS   Continuous Infusions:     LOS: 6 days    Time spent: 51 minutes spent on chart review, discussion with nursing staff, consultants, updating family  and interview/physical exam; more than 50% of that time was spent in counseling and/or coordination of care.     Alvira Philips [name], DO [facility] Available via Epic secure chat 7am-7pm After these hours, please refer to coverage provider listed on [email or web address] 10/08/2022, 8:59 AM

## 2022-10-08 NOTE — Progress Notes (Signed)
Asked PT about bipap tonight. PT firmly refused going on bipap machine.

## 2022-10-08 NOTE — Progress Notes (Signed)
NAME:  [name], MRN:  [id number], DOB:  01/10/1956, LOS: 6 ADMISSION DATE:  10/02/2022, CONSULTATION DATE:  10/08/2022  REFERRING MD:  [name] [facility], CHIEF COMPLAINT: Respiratory distress  History of Present Illness:  67 year old with COPD/asthma admitted 5/7 with shortness of breath and cough for 3 days.  Initially required BiPAP in the emergency room, treated for COPD exacerbation with IV steroids, budesonide/Brovana/Yupelri combination and IV ceftriaxone.  She was felt to be improving by 5/10 and was able to ambulate without oxygen but developed sudden respiratory distress on 5/11, requiring rapid response and transfer to ICU for BiPAP  Pertinent  Medical History  Chronic pain on narcotics Anxiety/psychosis COPD/asthma -PFTs 2018 show moderate airway obstruction with FEV1 67%, improved to 80% postbronchodilator, DLCO 45%.    Significant Hospital Events: Including procedures, antibiotic start and stop dates in addition to other pertinent events     Interim History / Subjective:  Did not use BiPAP overnight Feels like she is slowly improving Just ambulated to bathroom and back and is quite dyspneic.   Objective   Blood pressure (!) 148/84, pulse (!) 57, temperature 98.2 F (36.8 C), temperature source Oral, resp. rate 17, height 5\' 1"  (1.549 m), weight 44.5 kg, SpO2 100 %.    FiO2 (%):  [36 %] 36 %   Intake/Output Summary (Last 24 hours) at 10/08/2022 1051 Last data filed at 10/08/2022 1046 Gross per 24 hour  Intake 240 ml  Output 275 ml  Net -35 ml    Filed Weights   10/02/22 1843  Weight: 44.5 kg    Examination: General: Frail elderly appearing female in mild resp distress after ambulating HENT: [place]/AT, PERRL, no JVD Lungs: Referred upper airway wheeze in all lung fields.  Cardiovascular: RRR, no MRG Abdomen: Soft, NT, ND Extremities: No acute deformity or ROM limitation. No edema.  Neuro: Alert, oriented, non-focal  ABG 5/11 acute respiratory acidosis. VBG  5/12 shows compensated hypercarbia  Resolved Hospital Problem list     Assessment & Plan:  Acute on chronic hypercarbic respiratory failure:  Severe emphysema/hyperinflation COPD/asthma -some reversibility on previous PFTs -BiPAP as needed -Budesonide/Brovana/Yupelri regimen with albuterol for breakthrough -Solumedrol 80mg  q 8 hours. (Reduced 5/13) - s/p 5 days CTX -Discharge regimen can be Advair plus Spiriva or Trelegy 100 if this is covered on her insurance  Sudden worsening indicates component of anxiety worsening hyperinflation and auto PEEP -Zoloft, Buspar agree. Per primary -PRN benzo OK in setting VCD, but haldol preferred as ordered.  -She indicates symptomatic relief specifically with morphine, continue PRN  Unfortunately doesn't seem like she is any closet to discharge.  She will need pulmonary office follow-up  Best Practice (right click and "Reselect all SmartList Selections" daily)   Diet/type: Regular consistency (see orders) DVT prophylaxis: LMWH GI prophylaxis: N/A Lines: N/A Foley:  N/A Code Status:  full code Last date of multidisciplinary goals of care discussion [Full code]  Labs   CBC: Recent Labs  Lab 10/02/22 1858 10/03/22 0051 10/03/22 0533 10/07/22 0510  WBC 8.2 5.9 3.8* 9.2  HGB 11.7* 11.0* 10.4* 11.6*  HCT 37.9 35.0* 33.3* 38.1  MCV 95.5 94.6 94.3 96.5  PLT 230 228 241 273     Basic Metabolic Panel: Recent Labs  Lab 10/02/22 1858 10/02/22 2020 10/02/22 2309 10/03/22 0051 10/03/22 0533 10/04/22 0524 10/06/22 0713 10/07/22 0510  NA 135  --   --   --  135 138 138 138  K 2.6*  --   --   --  2.9* 5.0 4.3 4.4  CL 96*  --   --   --  97* 103 97* 97*  CO2 29  --   --   --  30 31 33* 32  GLUCOSE 153*  --   --   --  174* 115* 123* 111*  BUN 9  --   --   --  9 16 21 20   CREATININE 0.58  --   --  0.60 0.53 0.62 0.54 0.61  CALCIUM 8.1*  --   --   --  8.2* 8.6* 8.8* 8.9  MG  --  10.4* 2.3  --   --  2.3  --  2.1    GFR: Estimated  Creatinine Clearance: 46.6 mL/min (by C-G formula based on SCr of 0.61 mg/dL). Recent Labs  Lab 10/02/22 1858 10/03/22 0051 10/03/22 0533 10/07/22 0510  WBC 8.2 5.9 3.8* 9.2     Liver Function Tests: Recent Labs  Lab 10/03/22 0533 10/07/22 0510  AST 18 19  ALT 11 29  ALKPHOS 44 39  BILITOT 0.4 0.5  PROT 6.3* 6.0*  ALBUMIN 3.4* 3.4*    No results for input(s): "LIPASE", "AMYLASE" in the last 168 hours. No results for input(s): "AMMONIA" in the last 168 hours.  ABG    Component Value Date/Time   PHART 7.33 (L) 10/06/2022 1013   PCO2ART 72 (HH) 10/06/2022 1013   PO2ART 193 (H) 10/06/2022 1013   HCO3 39.0 (H) 10/07/2022 0510   TCO2 33 12/04/2015 1408   O2SAT 74.1 10/07/2022 0510     Coagulation Profile: No results for input(s): "INR", "PROTIME" in the last 168 hours.  Cardiac Enzymes: No results for input(s): "CKTOTAL", "CKMB", "CKMBINDEX", "TROPONINI" in the last 168 hours.  HbA1C: Hgb A1c MFr Bld  Date/Time Value Ref Range Status  11/07/2016 02:06 PM 5.6 4.8 - 5.6 % Final    Comment:    (NOTE)         Pre-diabetes: 5.7 - 6.4         Diabetes: >6.4         Glycemic control for adults with diabetes: <7.0     CBG: Recent Labs  Lab 10/04/22 1513 10/06/22 1546  GLUCAP 92 116*       Joneen Roach, AGACNP-BC [facility]  See Amion for personal pager PCCM on call pager [phone] until 7pm. Please call Elink 7p-7a. [phone]  10/08/2022 10:57 AM

## 2022-10-08 NOTE — Progress Notes (Signed)
Chaplain engaged in a follow-up visit with [name]. Chaplain was able to read scripture with [name] at her request. Chaplain continues to be supportive presence and to offer community.   Chaplain will follow-up.     10/08/22 1100  Spiritual Encounters  Type of Visit Follow up  Care provided to: Patient  Reason for visit Routine spiritual support  Spiritual Framework  Presenting Themes Rituals and practive;Community and relationships

## 2022-10-09 DIAGNOSIS — J441 Chronic obstructive pulmonary disease with (acute) exacerbation: Secondary | ICD-10-CM | POA: Diagnosis not present

## 2022-10-09 LAB — BASIC METABOLIC PANEL
Anion gap: 8 (ref 5–15)
BUN: 26 mg/dL — ABNORMAL HIGH (ref 8–23)
CO2: 37 mmol/L — ABNORMAL HIGH (ref 22–32)
Calcium: 9 mg/dL (ref 8.9–10.3)
Chloride: 95 mmol/L — ABNORMAL LOW (ref 98–111)
Creatinine, Ser: 0.64 mg/dL (ref 0.44–1.00)
GFR, Estimated: >60 mL/min (ref >60)
Glucose, Bld: 139 mg/dL — ABNORMAL HIGH (ref 70–99)
Potassium: 4.2 mmol/L (ref 3.5–5.1)
Sodium: 140 mmol/L (ref 135–145)

## 2022-10-09 LAB — CBC
HCT: 39.4 % (ref 36.0–46.0)
Hemoglobin: 12.2 g/dL (ref 12.0–15.0)
MCH: 29.5 pg (ref 26.0–34.0)
MCHC: 31 g/dL (ref 30.0–36.0)
MCV: 95.2 fL (ref 80.0–100.0)
Platelets: 287 10*3/uL (ref 150–400)
RBC: 4.14 MIL/uL (ref 3.87–5.11)
RDW: 13.3 % (ref 11.5–15.5)
WBC: 7.8 10*3/uL (ref 4.0–10.5)
nRBC: 0 % (ref 0.0–0.2)

## 2022-10-09 LAB — BLOOD GAS, VENOUS
Acid-Base Excess: 15.5 mmol/L — ABNORMAL HIGH (ref 0.0–2.0)
Bicarbonate: 43.7 mmol/L — ABNORMAL HIGH (ref 20.0–28.0)
Drawn by: 7020
O2 Saturation: 69.5 %
Patient temperature: 37.1
pCO2, Ven: 69 mmHg — ABNORMAL HIGH (ref 44–60)
pH, Ven: 7.41 (ref 7.25–7.43)
pO2, Ven: 38 mmHg (ref 32–45)

## 2022-10-09 LAB — MAGNESIUM: Magnesium: 2.2 mg/dL (ref 1.7–2.4)

## 2022-10-09 MED ORDER — PREDNISONE 5 MG PO TABS: 5.0000 mg | ORAL_TABLET | Freq: Every day | ORAL | Status: DC

## 2022-10-09 MED ORDER — PREDNISONE 5 MG PO TABS: 10.0000 mg | ORAL_TABLET | Freq: Every day | ORAL | Status: DC

## 2022-10-09 MED ORDER — PREDNISONE 20 MG PO TABS: 20.0000 mg | ORAL_TABLET | Freq: Every day | ORAL | Status: DC

## 2022-10-09 MED ORDER — ALUM & MAG HYDROXIDE-SIMETH 200-200-20 MG/5ML PO SUSP
30.0000 mL | Freq: Four times a day (QID) | ORAL | Status: DC | PRN
  Administered 2022-10-09 (×2): 30 mL via ORAL
  Filled 2022-10-09 (×2): qty 30

## 2022-10-09 MED ORDER — PREDNISONE 20 MG PO TABS
40.0000 mg | ORAL_TABLET | Freq: Every day | ORAL | Status: DC
  Administered 2022-10-10 – 2022-10-11 (×2): 40 mg via ORAL
  Filled 2022-10-09 (×2): qty 2

## 2022-10-09 MED ORDER — PREDNISONE 5 MG PO TABS: 30.0000 mg | ORAL_TABLET | Freq: Every day | ORAL | Status: DC

## 2022-10-09 NOTE — Consult Note (Signed)
Consultation Note Date: 10/09/2022   Patient Name: [name]  DOB: 04/01/1956  MRN: [id number]  Age / Sex: 67 y.o., female  PCP: Patient, No Pcp Per Referring Physician: [name], [name], DO  Reason for Consultation: Establishing goals of care  HPI/Patient Profile: 67 y.o. female     admitted on 10/02/2022    Clinical Assessment and Goals of Care: 67 year old lady who lives by herself here in [place], [place].  She has 3 children and several grandchildren and great-grandchildren.  She has medical history of COPD/asthma hypertension chronic pain syndrome osteoarthritis tobacco use disorder.  Patient states that she has chronic pain syndrome and sees pain provider at [facility] clinic on [address] in [place], [place].  She states that at home she is on Percocet scheduled every 6 hours. Patient has been admitted to hospital medicine service for COPD exacerbation, acute hypoxic/hypercarbic respiratory failure.  Patient states that she has had unintentional weight loss over the course of the past year-year and a half.  She states she has gone from 143 to 98 pounds.  She states that she has had extensive evaluation with colonoscopy mammogram and abdominal scans and states that she has not been given a diagnosis of why she is losing weight.  She states that she does not smoke anymore, denies alcohol intake.  Patient has severe protein calorie malnutrition, weakness debility and anxiety. Palliative consult for goals of care discussions has been requested. Chart reviewed, patient seen and examined.  Nursing colleague was also present in the room.  Medication history noted. Palliative medicine is specialized medical care for people living with serious illness. It focuses on providing relief from the symptoms and stress of a serious illness. The goal is to improve quality of life for both the  patient and the family. Goals of care: Broad aims of medical therapy in relation to the patient's values and preferences. Our aim is to provide medical care aimed at enabling patients to achieve the goals that matter most to them, given the circumstances of their particular medical situation and their constraints.    NEXT OF KIN States that she has 2 daughters and 1 son.  States that she wishes to designate one of her grandchildren as her healthcare power of attorney agent.  Does not have any formal living will or advance care directives prepared.  SUMMARY OF RECOMMENDATIONS   CODE STATUS and goals of care discussions held.  Advance care planning discussions held.  Patient's believes she will select one of her grandchildren to be her designated healthcare power of attorney agent.  She wishes to think about this more.  Additionally, differences between full code versus DNR/DNI also discussed.  Full code for now.  Continue current mode of care recommend home-based palliative services upon discharge.  Recommend continued follow-up at [facility] for pain management as well as for primary care. Thank you for the consult.  Code Status/Advance Care Planning: Full code   Symptom Management:     Palliative Prophylaxis:  Frequent Pain Assessment  Additional Recommendations (Limitations, Scope, Preferences): Full Scope Treatment  Psycho-social/Spiritual:  Desire for further Chaplaincy support:yes Additional Recommendations: Caregiving  Support/Resources  Prognosis:  Unable to determine  Discharge Planning: Home with Palliative Services      Primary Diagnoses: Present on Admission:  COPD with acute exacerbation (HCC)   I have reviewed the medical record, interviewed the patient and family, and examined the patient. The following aspects are pertinent.  Past Medical History:  Diagnosis Date   Arthritis    Asthma    Chest pain 2006   Chest pain 10/2016   Chronic back  pain    Hypertension    Sciatica    Shortness of breath    due to  smoking    Social History   Socioeconomic History   Marital status: Single    Spouse name: Not on file   Number of children: Not on file   Years of education: Not on file   Highest education level: Not on file  Occupational History   Occupation: Retired  Tobacco Use   Smoking status: Every Day    Packs/day: 0.10    Years: 30.00    Additional pack years: 0.00    Total pack years: 3.00    Types: Cigarettes   Smokeless tobacco: Never   Tobacco comments:    Smoking since teenage years  Vaping Use   Vaping Use: Never used  Substance and Sexual Activity   Alcohol use: No   Drug use: No   Sexual activity: Never  Other Topics Concern   Not on file  Social History Narrative   Great-grandmother   Used to work as a Corporate treasurer, now volunteers w/ free time at [employer] Strain: Low Risk  (10/05/2020)   Overall Financial Resource Strain (CARDIA)    Difficulty of Paying Living Expenses: Not hard at all  Food Insecurity: No Food Insecurity (10/03/2022)   Hunger Vital Sign    Worried About Running Out of Food in the Last Year: Never true    Ran Out of Food in the Last Year: Never true  Recent Concern: Food Insecurity - Food Insecurity Present (09/08/2022)   Hunger Vital Sign    Worried About Running Out of Food in the Last Year: Often true    Ran Out of Food in the Last Year: Sometimes true  Transportation Needs: No Transportation Needs (10/03/2022)   PRAPARE - Administrator, Civil Service (Medical): No    Lack of Transportation (Non-Medical): No  Physical Activity: Not on file  Stress: No Stress Concern Present (10/05/2020)   [employer] of Occupational Health - Occupational Stress Questionnaire    Feeling of Stress : Not at all  Social Connections: Moderately Integrated (10/05/2020)   Social Connection and Isolation Panel [NHANES]     Frequency of Communication with Friends and Family: More than three times a week    Frequency of Social Gatherings with Friends and Family: Three times a week    Attends Religious Services: More than 4 times per year    Active Member of Clubs or Organizations: Yes    Attends Banker Meetings: More than 4 times per year    Marital Status: Widowed   Family History  Problem Relation Age of Onset   Heart attack Mother 49   Hypertension Sister    Diabetes type II Sister    Tongue cancer Brother  Heart attack Sister 54   Lung cancer Sister    Hypertension Sister    Scheduled Meds:  arformoterol  15 mcg Nebulization BID   budesonide (PULMICORT) nebulizer solution  0.5 mg Nebulization BID   busPIRone  15 mg Oral BID   Chlorhexidine Gluconate Cloth  6 each Topical Daily   enalapril  5 mg Oral Daily   enoxaparin (LOVENOX) injection  30 mg Subcutaneous Q24H   feeding supplement  237 mL Oral TID BM   LORazepam  1 mg Intravenous Once   methylPREDNISolone (SOLU-MEDROL) injection  80 mg Intravenous Q12H   multivitamin with minerals  1 tablet Oral Daily   mouth rinse  15 mL Mouth Rinse 4 times per day   [START ON 10/10/2022] predniSONE  40 mg Oral Q breakfast   Followed by   Melene Muller ON 10/14/2022] predniSONE  30 mg Oral Q breakfast   Followed by   Melene Muller ON 10/18/2022] predniSONE  20 mg Oral Q breakfast   Followed by   Melene Muller ON 10/22/2022] predniSONE  10 mg Oral Q breakfast   Followed by   Melene Muller ON 10/26/2022] predniSONE  5 mg Oral Q breakfast   revefenacin  175 mcg Nebulization Daily   sertraline  50 mg Oral Daily   sodium chloride HYPERTONIC  4 mL Nebulization BID   zolpidem  7.5 mg Oral QHS   Continuous Infusions: PRN Meds:.albuterol, alum & mag hydroxide-simeth, haloperidol lactate, hydrALAZINE, LORazepam, morphine injection, mouth rinse, oxyCODONE-acetaminophen **AND** oxyCODONE, pantoprazole, sodium chloride Medications Prior to Admission:  Prior to Admission  medications   Medication Sig Start Date End Date Taking? Authorizing Provider  albuterol (PROVENTIL) (2.5 MG/3ML) 0.083% nebulizer solution Take 3 mLs (2.5 mg total) by nebulization every 6 (six) hours as needed for wheezing or shortness of breath. 06/10/21  Yes [name], PA-C  albuterol (VENTOLIN HFA) 108 (90 Base) MCG/ACT inhaler Inhale 2 puffs into the lungs every 4 (four) hours as needed for wheezing or shortness of breath. 06/10/21  Yes [name], PA-C  atorvastatin (LIPITOR) 10 MG tablet Take 10 mg by mouth at bedtime. 09/07/22  Yes [provider]  enalapril (VASOTEC) 10 MG tablet Take 10 mg by mouth daily. 09/12/22  Yes [provider]  fluticasone-salmeterol (ADVAIR DISKUS) 250-50 MCG/ACT AEPB 1 inhalation twice daily followed by cleaning teeth and tongue Patient taking differently: Inhale 1 puff into the lungs in the morning and at bedtime. 06/16/21  Yes Julieanne Manson, MD  hydrochlorothiazide (HYDRODIURIL) 25 MG tablet Take 25 mg by mouth daily. 09/12/22  Yes [provider]  lidocaine (LIDODERM) 5 % Place 1 patch onto the skin daily. Remove & Discard patch within 12 hours or as directed by MD 12/01/21  Yes Achille Rich, PA-C  Multiple Vitamin (MULTIVITAMIN) capsule Take 1 capsule by mouth daily.   Yes [provider]  omeprazole (PRILOSEC) 40 MG capsule 1 cap by mouth in morning daily 1/2 hour before breakfast or other meds. Patient taking differently: Take 40 mg by mouth daily as needed (for acid reflux). 06/16/21  Yes Julieanne Manson, MD  oxyCODONE-acetaminophen (PERCOCET) 10-325 MG tablet Take 1 tablet by mouth every 4 (four) hours as needed for pain. 12/23/20  Yes [provider]  zolpidem (AMBIEN) 5 MG tablet Take 5 mg by mouth at bedtime. 12/25/20  Yes [provider]  enalapril-hydrochlorothiazide (VASERETIC) 10-25 MG tablet TAKE 1 TABLET BY MOUTH DAILY Patient not taking: Reported on 10/05/2022 12/08/21    Julieanne Manson, MD   No  Known Allergies Review of Systems Patient complains of back and abdominal pain. Physical Exam Thin, weak appearing Chronically ill-appearing Is on supplemental oxygen nasal cannula Abdomen is not distended Regular work of breathing No edema  Vital Signs: BP (!) 151/73 (BP Location: Right Arm)   Pulse (!) 55   Temp 99.2 F (37.3 C) (Oral)   Resp 18   Ht 5\' 1"  (1.549 m)   Wt 44.5 kg   SpO2 98%   BMI 18.52 kg/m  Pain Scale: 0-10   Pain Score: 2    SpO2: SpO2: 98 % O2 Device:SpO2: 98 % O2 Flow Rate: .O2 Flow Rate (L/min): 4 L/min  IO: Intake/output summary:  Intake/Output Summary (Last 24 hours) at 10/09/2022 1527 Last data filed at 10/09/2022 0900 Gross per 24 hour  Intake 480 ml  Output 150 ml  Net 330 ml    LBM: Last BM Date : 10/08/22 Baseline Weight: Weight: 44.5 kg Most recent weight: Weight: 44.5 kg     Palliative Assessment/Data:   PPS 60%  Time In:  1430 Time Out:  1530 Time Total:  60  Greater than 50%  of this time was spent counseling and coordinating care related to the above assessment and plan.  Signed by: Rosalin Hawking, MD   Please contact Palliative Medicine Team phone at [phone] for questions and concerns.  For individual provider: See Loretha Stapler

## 2022-10-09 NOTE — Progress Notes (Signed)
Patient refused to allow writer back in room, will report to night shift.

## 2022-10-09 NOTE — Progress Notes (Signed)
Physical Therapy Treatment Patient Details Name: [name] MRN: [id number] DOB: [birth year] Today's Date: 10/09/2022   History of Present Illness Pt is 67 yo female who was admitted to the hospital on 10/02/22 with shortness of breath and cough. She was found to have COPD exacerbation. PMH: COPD, asthma, HTN, chronic pain, OA, sciatica, THA    PT Comments    Patient insistent on decreasing O2 to 1  LPM, on 4 LPM . Patients states that MD wants to wean  the O2. Placed on 1 LPM, noted to drift down to 85%, Increased to 2 L, SPO2 remained > 925 for remainder of mobility. Patient " drifts"/ slow motion-like when ambulating.  Patient left on 2 LPM  after return to bed, RN aware.  Patient requesting a RW and 3 in 1 . Has been provided a  tub seat which is room..  Recommendations for follow up therapy are one component of a multi-disciplinary discharge planning process, led by the attending physician.  Recommendations may be updated based on patient status, additional functional criteria and insurance authorization.  Follow Up Recommendations       Assistance Recommended at Discharge Intermittent Supervision/Assistance  Patient can return home with the following Assistance with cooking/housework;Help with stairs or ramp for entrance   Equipment Recommendations  Rolling walker (2 wheels);BSC/3in1    Recommendations for Other Services       Precautions / Restrictions Precautions Precautions: Fall Precaution Comments: monitor sats, trying to wedan off O2     Mobility  Bed Mobility Overal bed mobility: Modified Independent                  Transfers Overall transfer level: Needs assistance Equipment used: None Transfers: Sit to/from Stand, Bed to chair/wheelchair/BSC Sit to Stand: Min guard, Min assist           General transfer comment: patient  states she had medication and is groggy, once standing leans over, holds foot board to step to [facility] which is at foot of bed.  multimodal cues for safety. Patient moves in "slow Motion". After return to room , patient insisted on seeing what O2 setting was on, patient  stood and held onto bed rail to step to [facility]    Ambulation/Gait Ambulation/Gait assistance: Min guard, Min assist Gait Distance (Feet): 80 Feet Assistive device: Rolling walker (2 wheels) Gait Pattern/deviations: Step-through pattern, Decreased stride length, Staggering right, Staggering left Gait velocity: decreased     General Gait Details: patient given RW due to leaning over when standing. Exta time to ambulate, cues fpr safety, at times RW not in front.   Stairs             Wheelchair Mobility    Modified Rankin (Stroke Patients Only)       Balance Overall balance assessment: Needs assistance Sitting-balance support: No upper extremity supported Sitting balance-Leahy Scale: Good     Standing balance support: No upper extremity supported, Single extremity supported Standing balance-Leahy Scale: Fair Standing balance comment: occasional UE use with gait ; drifting R/L                            Cognition Arousal/Alertness: Awake/alert Behavior During Therapy: WFL for tasks assessed/performed   Area of Impairment: Safety/judgement                               General Comments: Oriented x4,  able to follow commands, required intermittent cues for safety awareness, decreased insight into deficits (suspect baseline)        Exercises      General Comments        Pertinent Vitals/Pain Pain Assessment Pain Location: none    Home Living                          Prior Function            PT Goals (current goals can now be found in the care plan section) Progress towards PT goals: Progressing toward goals    Frequency    Min 1X/week      PT Plan Current plan remains appropriate    Co-evaluation              AM-PAC PT "6 Clicks" Mobility   Outcome Measure   Help needed turning from your back to your side while in a flat bed without using bedrails?: None Help needed moving from lying on your back to sitting on the side of a flat bed without using bedrails?: None Help needed moving to and from a bed to a chair (including a wheelchair)?: A Little Help needed standing up from a chair using your arms (e.g., wheelchair or bedside chair)?: A Little Help needed to walk in hospital room?: A Little Help needed climbing 3-5 steps with a railing? : A Little 6 Click Score: 20    End of Session Equipment Utilized During Treatment: Gait belt;Oxygen Activity Tolerance: Patient tolerated treatment well Patient left: in bed;with call bell/phone within reach;with bed alarm set Nurse Communication: Mobility status PT Visit Diagnosis: Other abnormalities of gait and mobility (R26.89)     Time: 1245-1330 PT Time Calculation (min) (ACUTE ONLY): 45 min  Charges:  $Gait Training: 23-37 mins $Self Care/Home Management: 8-22                     Blanchard Kelch PT Acute Rehabilitation Services Office [phone] Weekend [phone]    Rada Hay 10/09/2022, 3:59 PM

## 2022-10-09 NOTE — Progress Notes (Signed)
PROGRESS NOTE    TENIESHA STAUCH  ZOX:[id number] DOB: [birth year] DOA: 10/02/2022 PCP: Patient, No Pcp Per    Brief Narrative:   SAMIRIA SUNDELL is a 67 y.o. female with past medical history significant for COPD/asthma, HTN, chronic pain syndrome, OA, tobacco use disorder who presented to [facility] ED from home via EMS on 5/7 with progressive shortness of breath, cough.  Onset last 2-3 days.  Initially endorses some chest pains/tightness.  Denies fever/chills, no sick contacts.  On EMS arrival, patient was noted to be tachypneic with wheezing and received DuoNebs x 3 and IV Solu-Medrol; placed on BiPAP and patient was subsequently transported to the ED for further evaluation.  In the ED, temperature 98.3 F, HR 93, RR 17, BP 127/68, SpO2 100% on BiPAP.  WBC 8.2, hemoglobin 11.7, platelet count 230.  Sodium 135, potassium 2.6, chloride 96, CO2 29, glucose 153, BUN 9, creatinine 0.58.  High sensitive troponin 5 followed by 6.  Chest x-ray with increased bronchial thickening from prior exam, may be acute bronchitis or COPD exacerbation, mild to advanced emphysema.  Patient received IV steroids, DuoNeb, IV potassium, albuterol neb, fentanyl, and morphine.  EDP consulted [facility] for admission for further evaluation management of acute respiratory failure secondary to COPD exacerbation requiring BiPAP.  Assessment & Plan:    COPD exacerbation Acute hypoxic/hypercarbic respiratory failure Patient presenting to ED with progressive shortness of breath, wheezing.  Denies sick contacts.  Patient is afebrile without leukocytosis.  Chest x-ray with increased bronchial thickening from prior exam without focal consolidation.  Patient received IV steroids, 3 nebs by EMS and was placed on BiPAP and transported to the ED for further evaluation.  Patient was eventually titrated off of BiPAP to nasal cannula.  On 10/06/2022, patient developed increased respiratory distress, confusion and was transferred to the  stepdown unit.  ABG with pH 7.33, pCO2 72, pO2 193. -- PCCM following, appreciate assistance -- Solu-Medrol to 80 mg IV q12h; plan to initiate prednisone tapering tomorrow -- Completed 5-day course of ceftriaxone -- Brovana neb twice daily -- Budesonide neb twice daily -- Yupelri 175 mcg neb daily -- Albuterol neb every 2 hours as needed shortness of breath/wheezing -- Continue supplemental oxygen, maintain SpO2 > 88%; currently on 2 L nasal cannula with SpO2 97% at rest -- BiPAP nightly; although has been refusing; discussed with RT will try smaller BiPAP unit this evening -- Palliative care consulted for assistance with goals of care/medical decision making  Hypokalemia: Resolved Repleted during hospitalization.  Essential hypertension Home regimen includes enalapril-HCTZ 10-25 mg p.o. daily. -- Restarted enalapril at 5 mg p.o. daily -- Continue monitor BP closely and adjust accordingly  Chronic pain syndrome -- Continue home regimen Percocet/oxycodone  GERD -- Protonix 80 mg p.o. daily as needed for heartburn/indigestion  Tobacco use disorder Counseled on need for complete cessation/abstinence.  Anxiety Hx Mood disorder NOS Last hospitalized at [facility] for concern of auditory/visual hallucinations and questionable suicidal ideation in 2011.  Was initially started on Abilify but side effects of nausea/dizziness and was changed to Geodon.  Currently not on any anxiety, depressive or mood stabilizer currently.  During this hospital course, patient has had multiple fluctuations in anxiety. -- Started Zoloft 50 mg p.o. daily -- Started Buspar 15mg  PO BID -- Ativan 1 mg p.o. every 6 hours as needed anxiety -- Haldol as needed agitation  Severe protein calorie malnutrition Body mass index is 18.52 kg/m.  Nutrition Status: Nutrition Problem: Severe Malnutrition Etiology: chronic illness (COPD)  Signs/Symptoms: severe fat depletion, severe muscle depletion Interventions: Ensure  Enlive (each supplement provides 350kcal and 20 grams of protein), MVI, Magic cup -- Dietitian following, diet liberalized to regular, continue to encourage increased oral intake, supplementation  Weakness/debility/deconditioning/gait disturbance: -- Continue PT/OT efforts while inpatient   DVT prophylaxis: enoxaparin (LOVENOX) injection 30 mg Start: 10/04/22 1000    Code Status: Full Code Family Communication: No family present at bedside this morning  Disposition Plan:  Level of care: Telemetry Status is: Inpatient Remains inpatient appropriate because: IV steroids, BiPAP, IV antibiotics    Consultants:  PCCM Palliative care  Procedures:  None  Antimicrobials:  Ceftriaxone 5/7>>   Subjective: Patient seen examined at bedside, lying in bed.continues to report feeling "ill".  Discussed with patient regarding refusal of BiPAP now 3 days straight that this will help her feel better at nighttime.  Discussed with RT, may consider utilizing a "smaller" BiPAP unit tonight to see if she tolerates that.  Patient in agreement and states will utilize tonight.  Seen by pulmonology this morning with recommendations of transitioning to oral prednisone tomorrow.  Will also check ambulatory O2 screen today has now titrated down to 2 L nasal cannula at rest with SpO2 97%.  No other specific questions or concerns at this time.  Discussed with RN. Denies headache, no dizziness, no chest pain, no palpitations, no abdominal pain, no fever/chills/night sweats, no nausea/vomiting/diarrhea.  No acute concerns overnight per nursing staff.   Objective: Vitals:   10/08/22 1654 10/08/22 2002 10/09/22 0446 10/09/22 0814  BP: 119/63 112/74 (!) 154/74   Pulse: (!) 55 (!) 57 (!) 59   Resp: 14 20 20    Temp: 99.5 F (37.5 C) 99.5 F (37.5 C) 98.5 F (36.9 C)   TempSrc: Oral Oral Oral   SpO2: 100% 100% 97% 98%  Weight:      Height:        Intake/Output Summary (Last 24 hours) at 10/09/2022 1122 Last  data filed at 10/09/2022 0900 Gross per 24 hour  Intake 720 ml  Output 150 ml  Net 570 ml   Filed Weights   10/02/22 1843  Weight: 44.5 kg    Examination:  Physical Exam: GEN: Respiratory distress, thin/chronically ill in appearance, appears older than stated age HEENT: NCAT, PERRL, EOMI, sclera clear, MMM, poor dentition PULM: Soft late expiratory wheezing appreciated throughout all lung fields, no crackles, normal respiratory effort without accessory muscle use, currently on 2 L nasal cannula pO2 97% at rest CV: RRR w/o M/G/R GI: abd soft, NTND, NABS, no R/G/M MSK: no peripheral edema, moves all extremities independently NEURO: No focal neurological deficits PSYCH: Anxious mood Integumentary: No appreciable rashes/lesions/wounds noted on exposed skin surfaces    Data Reviewed: I have personally reviewed following labs and imaging studies  CBC: Recent Labs  Lab 10/02/22 1858 10/03/22 0051 10/03/22 0533 10/07/22 0510 10/09/22 0531  WBC 8.2 5.9 3.8* 9.2 7.8  HGB 11.7* 11.0* 10.4* 11.6* 12.2  HCT 37.9 35.0* 33.3* 38.1 39.4  MCV 95.5 94.6 94.3 96.5 95.2  PLT 230 228 241 273 287   Basic Metabolic Panel: Recent Labs  Lab 10/02/22 2020 10/02/22 2309 10/03/22 0051 10/03/22 0533 10/04/22 0524 10/06/22 0713 10/07/22 0510 10/09/22 0531  NA  --   --   --  135 138 138 138 140  K  --   --   --  2.9* 5.0 4.3 4.4 4.2  CL  --   --   --  97* 103 97* 97*  95*  CO2  --   --   --  30 31 33* 32 37*  GLUCOSE  --   --   --  174* 115* 123* 111* 139*  BUN  --   --   --  9 16 21 20  26*  CREATININE  --   --    < > 0.53 0.62 0.54 0.61 0.64  CALCIUM  --   --   --  8.2* 8.6* 8.8* 8.9 9.0  MG 10.4* 2.3  --   --  2.3  --  2.1 2.2   < > = values in this interval not displayed.   GFR: Estimated Creatinine Clearance: 46.6 mL/min (by C-G formula based on SCr of 0.64 mg/dL). Liver Function Tests: Recent Labs  Lab 10/03/22 0533 10/07/22 0510  AST 18 19  ALT 11 29  ALKPHOS 44 39   BILITOT 0.4 0.5  PROT 6.3* 6.0*  ALBUMIN 3.4* 3.4*   No results for input(s): "LIPASE", "AMYLASE" in the last 168 hours. No results for input(s): "AMMONIA" in the last 168 hours. Coagulation Profile: No results for input(s): "INR", "PROTIME" in the last 168 hours. Cardiac Enzymes: No results for input(s): "CKTOTAL", "CKMB", "CKMBINDEX", "TROPONINI" in the last 168 hours. BNP (last 3 results) No results for input(s): "PROBNP" in the last 8760 hours. HbA1C: No results for input(s): "HGBA1C" in the last 72 hours. CBG: Recent Labs  Lab 10/04/22 1513 10/06/22 1546  GLUCAP 92 116*   Lipid Profile: No results for input(s): "CHOL", "HDL", "LDLCALC", "TRIG", "CHOLHDL", "LDLDIRECT" in the last 72 hours. Thyroid Function Tests: No results for input(s): "TSH", "T4TOTAL", "FREET4", "T3FREE", "THYROIDAB" in the last 72 hours. Anemia Panel: No results for input(s): "VITAMINB12", "FOLATE", "FERRITIN", "TIBC", "IRON", "RETICCTPCT" in the last 72 hours. Sepsis Labs: No results for input(s): "PROCALCITON", "LATICACIDVEN" in the last 168 hours.  No results found for this or any previous visit (from the past 240 hour(s)).       Radiology Studies: No results found.      Scheduled Meds:  arformoterol  15 mcg Nebulization BID   budesonide (PULMICORT) nebulizer solution  0.5 mg Nebulization BID   busPIRone  15 mg Oral BID   Chlorhexidine Gluconate Cloth  6 each Topical Daily   enalapril  5 mg Oral Daily   enoxaparin (LOVENOX) injection  30 mg Subcutaneous Q24H   feeding supplement  237 mL Oral TID BM   LORazepam  1 mg Intravenous Once   methylPREDNISolone (SOLU-MEDROL) injection  80 mg Intravenous Q12H   multivitamin with minerals  1 tablet Oral Daily   mouth rinse  15 mL Mouth Rinse 4 times per day   [START ON 10/10/2022] predniSONE  40 mg Oral Q breakfast   Followed by   Melene Muller ON 10/14/2022] predniSONE  30 mg Oral Q breakfast   Followed by   Melene Muller ON 10/18/2022] predniSONE  20 mg  Oral Q breakfast   Followed by   Melene Muller ON 10/22/2022] predniSONE  10 mg Oral Q breakfast   Followed by   Melene Muller ON 10/26/2022] predniSONE  5 mg Oral Q breakfast   revefenacin  175 mcg Nebulization Daily   sertraline  50 mg Oral Daily   sodium chloride HYPERTONIC  4 mL Nebulization BID   zolpidem  7.5 mg Oral QHS   Continuous Infusions:     LOS: 7 days    Time spent: 51 minutes spent on chart review, discussion with nursing staff, consultants, updating family and interview/physical exam; more than  50% of that time was spent in counseling and/or coordination of care.     Alvira Philips [name], DO [facility] Available via Epic secure chat 7am-7pm After these hours, please refer to coverage provider listed on [email or web address] 10/09/2022, 11:22 AM

## 2022-10-09 NOTE — Progress Notes (Signed)
Have wrote down times pt can get pain medications throughout the day. Pt has complained bc iv pain medications dont burn in her arm like they normally have. Have accused this Clinical research associate and also night shift for not giving her pain medications but saying they have given to her. Pain medications have been given within time frame everytime she has asked and they have been available to be given.

## 2022-10-09 NOTE — Progress Notes (Signed)
Informed Dr [name] and also charge nurse that patient is requesting to leave AMA and is calling family. Pt is irate stating this Clinical research associate has not taken care of her, been in patients room multiple times today. Has accused this Clinical research associate of not giving her her pain medications, stated we have walked by all day and havent checked on patient, and also accused of wasting pain medications on the sheets in the bed instead of giving to her. From this point forward will take another RN in the room when pain medications are administered. This writer has changed pts bed, and also taken patient back and forth to the [facility] several times throughout the day. She is requesting a breathing treatment and respiratory was also notified immediately and was up to see patient at this time.

## 2022-10-09 NOTE — Progress Notes (Signed)
NAME:  [name], MRN:  [id number], DOB:  1955/11/04, LOS: 7 ADMISSION DATE:  10/02/2022, CONSULTATION DATE:  10/09/2022  REFERRING MD:  [name] [facility], CHIEF COMPLAINT: Respiratory distress  History of Present Illness:  67 year old with COPD/asthma admitted 5/7 with shortness of breath and cough for 3 days.  Initially required BiPAP in the emergency room, treated for COPD exacerbation with IV steroids, budesonide/Brovana/Yupelri combination and IV ceftriaxone.  She was felt to be improving by 5/10 and was able to ambulate without oxygen but developed sudden respiratory distress on 5/11, requiring rapid response and transfer to ICU for BiPAP  Pertinent  Medical History  Chronic pain on narcotics Anxiety/psychosis COPD/asthma -PFTs 2018 show moderate airway obstruction with FEV1 67%, improved to 80% postbronchodilator, DLCO 45%.    Significant Hospital Events: Including procedures, antibiotic start and stop dates in addition to other pertinent events     Interim History / Subjective:  Did not use BiPAP overnight Feels like she is slowly improving Denies wheezing today  Objective   Blood pressure (!) 154/74, pulse (!) 59, temperature 98.5 F (36.9 C), temperature source Oral, resp. rate 20, height 5\' 1"  (1.549 m), weight 44.5 kg, SpO2 98 %.        Intake/Output Summary (Last 24 hours) at 10/09/2022 1104 Last data filed at 10/09/2022 0900 Gross per 24 hour  Intake 720 ml  Output 150 ml  Net 570 ml   Filed Weights   10/02/22 1843  Weight: 44.5 kg    Examination: General: Frail elderly appearing female, no acute distress HENT: [place]/AT, PERRL, no JVD Lungs: no wheezing, diminished air movement Cardiovascular: RRR, no MRG Abdomen: Soft, NT, ND Extremities: No acute deformity or ROM limitation. No edema.  Neuro: Alert, oriented, non-focal   Resolved Hospital Problem list     Assessment & Plan:  Acute on chronic hypercarbic respiratory failure  Severe  emphysema/hyperinflation COPD/asthma -some reversibility on previous PFTs -BiPAP as needed -Budesonide/Brovana/Yupelri regimen with albuterol PRN -Solumedrol 80mg  q 12hours through today and transition to oral prednisone taper tomorrow - s/p 5 days CTX -Discharge regimen can be Advair plus Spiriva or Trelegy 100 if this is covered on her insurance - Goal SpO2 is 92%, wean O2 as able  Anxiety -Zoloft, Buspar agree. Per primary -PRN benzo OK in setting VCD, but haldol preferred as ordered.  -She indicates symptomatic relief specifically with morphine, continue PRN  Goal will be to wean off O2 prior to discharge. Will need ambulatory O2 assessments.   PCCM will continue to follow  Best Practice (right click and "Reselect all SmartList Selections" daily)   Diet/type: Regular consistency (see orders) DVT prophylaxis: LMWH GI prophylaxis: N/A Lines: N/A Foley:  N/A Code Status:  full code Last date of multidisciplinary goals of care discussion [Full code]  Labs   CBC: Recent Labs  Lab 10/02/22 1858 10/03/22 0051 10/03/22 0533 10/07/22 0510 10/09/22 0531  WBC 8.2 5.9 3.8* 9.2 7.8  HGB 11.7* 11.0* 10.4* 11.6* 12.2  HCT 37.9 35.0* 33.3* 38.1 39.4  MCV 95.5 94.6 94.3 96.5 95.2  PLT 230 228 241 273 287    Basic Metabolic Panel: Recent Labs  Lab 10/02/22 2020 10/02/22 2309 10/03/22 0051 10/03/22 0533 10/04/22 0524 10/06/22 0713 10/07/22 0510 10/09/22 0531  NA  --   --   --  135 138 138 138 140  K  --   --   --  2.9* 5.0 4.3 4.4 4.2  CL  --   --   --  97* 103 97* 97* 95*  CO2  --   --   --  30 31 33* 32 37*  GLUCOSE  --   --   --  174* 115* 123* 111* 139*  BUN  --   --   --  9 16 21 20  26*  CREATININE  --   --    < > 0.53 0.62 0.54 0.61 0.64  CALCIUM  --   --   --  8.2* 8.6* 8.8* 8.9 9.0  MG 10.4* 2.3  --   --  2.3  --  2.1 2.2   < > = values in this interval not displayed.   GFR: Estimated Creatinine Clearance: 46.6 mL/min (by C-G formula based on SCr of 0.64  mg/dL). Recent Labs  Lab 10/03/22 0051 10/03/22 0533 10/07/22 0510 10/09/22 0531  WBC 5.9 3.8* 9.2 7.8    Liver Function Tests: Recent Labs  Lab 10/03/22 0533 10/07/22 0510  AST 18 19  ALT 11 29  ALKPHOS 44 39  BILITOT 0.4 0.5  PROT 6.3* 6.0*  ALBUMIN 3.4* 3.4*   No results for input(s): "LIPASE", "AMYLASE" in the last 168 hours. No results for input(s): "AMMONIA" in the last 168 hours.  ABG    Component Value Date/Time   PHART 7.33 (L) 10/06/2022 1013   PCO2ART 72 (HH) 10/06/2022 1013   PO2ART 193 (H) 10/06/2022 1013   HCO3 43.7 (H) 10/09/2022 0531   TCO2 33 12/04/2015 1408   O2SAT 69.5 10/09/2022 0531     Coagulation Profile: No results for input(s): "INR", "PROTIME" in the last 168 hours.  Cardiac Enzymes: No results for input(s): "CKTOTAL", "CKMB", "CKMBINDEX", "TROPONINI" in the last 168 hours.  HbA1C: Hgb A1c MFr Bld  Date/Time Value Ref Range Status  11/07/2016 02:06 PM 5.6 4.8 - 5.6 % Final    Comment:    (NOTE)         Pre-diabetes: 5.7 - 6.4         Diabetes: >6.4         Glycemic control for adults with diabetes: <7.0     CBG: Recent Labs  Lab 10/04/22 1513 10/06/22 1546  GLUCAP 92 116*     Melody Comas, MD [facility] Office: [phone]   See Amion for personal pager PCCM on call pager [phone] until 7pm. Please call Elink 7p-7a. [phone]

## 2022-10-10 ENCOUNTER — Telehealth: Payer: Self-pay | Admitting: Pulmonary Disease

## 2022-10-10 DIAGNOSIS — Z7189 Other specified counseling: Secondary | ICD-10-CM | POA: Diagnosis not present

## 2022-10-10 DIAGNOSIS — Z515 Encounter for palliative care: Secondary | ICD-10-CM | POA: Diagnosis not present

## 2022-10-10 DIAGNOSIS — J441 Chronic obstructive pulmonary disease with (acute) exacerbation: Secondary | ICD-10-CM | POA: Diagnosis not present

## 2022-10-10 LAB — BASIC METABOLIC PANEL
Anion gap: 8 (ref 5–15)
BUN: 24 mg/dL — ABNORMAL HIGH (ref 8–23)
CO2: 36 mmol/L — ABNORMAL HIGH (ref 22–32)
Calcium: 9 mg/dL (ref 8.9–10.3)
Chloride: 93 mmol/L — ABNORMAL LOW (ref 98–111)
Creatinine, Ser: 0.54 mg/dL (ref 0.44–1.00)
GFR, Estimated: >60 mL/min (ref >60)
Glucose, Bld: 127 mg/dL — ABNORMAL HIGH (ref 70–99)
Potassium: 4.2 mmol/L (ref 3.5–5.1)
Sodium: 137 mmol/L (ref 135–145)

## 2022-10-10 LAB — MAGNESIUM: Magnesium: 2.1 mg/dL (ref 1.7–2.4)

## 2022-10-10 MED ORDER — SERTRALINE HCL 100 MG PO TABS
100.0000 mg | ORAL_TABLET | Freq: Every day | ORAL | Status: DC
  Administered 2022-10-10 – 2022-10-11 (×2): 100 mg via ORAL
  Filled 2022-10-10 (×2): qty 1

## 2022-10-10 NOTE — TOC Progression Note (Addendum)
Transition of Care [facility]) - Progression Note    Patient Details  Name: [name] MRN: [id number] Date of Birth: [birth year]  Transition of Care [facility]) CM/SW Contact  Shenica Holzheimer, Olegario Messier, RN Phone Number: 10/10/2022, 1:39 PM  Clinical Narrative: [employer] rep Brandi for HHRN/OT-patient in agreement. [employer] rep Vaughan Basta will confirm what dme he can provide-patient already has shower chair & neb machine.-he will confirm if can provide home 02,3n1, & rw. Patient's PCP Diamantina Monks with [facility] clinic.Patient has own transport home. -3:57p-confirmed w/patient, & [employer] rep Jermaine-rw,3n1 home 02(with documented 02 sats) to be delivered to rm prior d/c.      Expected Discharge Plan: Home w Home Health Services Barriers to Discharge: Continued Medical Work up  Expected Discharge Plan and Services   Discharge Planning Services: CM Consult Post Acute Care Choice: Home Health, Durable Medical Equipment Living arrangements for the past 2 months: Apartment                 DME Arranged: 3-N-1, Oxygen DME Agency: [employer] Date DME Agency Contacted: 10/10/22 Time DME Agency Contacted: 1332 Representative spoke with at DME Agency: Vaughan Basta HH Arranged: RN, OT HH Agency: [facility] Date [facility] Agency Contacted: 10/10/22 Time HH Agency Contacted: 1332 Representative spoke with at [facility] Agency: Merry Proud   Social Determinants of Health (SDOH) Interventions SDOH Screenings   Food Insecurity: No Food Insecurity (10/03/2022)  Recent Concern: Food Insecurity - Food Insecurity Present (09/08/2022)  Housing: Low Risk  (10/03/2022)  Recent Concern: Housing - Medium Risk (09/08/2022)  Transportation Needs: No Transportation Needs (10/03/2022)  Utilities: Not At Risk (10/03/2022)  Depression (PHQ2-9): Low Risk  (10/05/2020)  Financial Resource Strain: Low Risk  (10/05/2020)  Social Connections: Moderately Integrated (10/05/2020)  Stress: No Stress Concern Present (10/05/2020)  Tobacco  Use: High Risk (10/02/2022)    Readmission Risk Interventions    10/04/2022    9:50 AM  Readmission Risk Prevention Plan  Post Dischage Appt Complete  Medication Screening Complete  Transportation Screening Complete

## 2022-10-10 NOTE — Telephone Encounter (Signed)
thanks

## 2022-10-10 NOTE — Progress Notes (Signed)
Occupational Therapy Treatment Patient Details Name: [name] MRN: [id number] DOB: 1955/11/19 Today's Date: 10/10/2022   History of present illness Pt is 67 yo female who was admitted to the hospital on 10/02/22 with shortness of breath and cough. She was found to have COPD exacerbation. PMH: COPD, asthma, HTN, chronic pain, OA, sciatica, THA   OT comments  The session emphasized ADL instruction and participation, education on energy conservation strategies, and general safety during progressive activity. The pt required intermittent cues and education on safety during activity, as she appeared to have decreased insight into her deficits & the need for supplemental O2. Given her compromised endurance and shortness of breath with activity, she required intermittent therapeutic rest breaks. Her heart arte increased up to 136 bpm with activity & her O2 saturation decreased to as low as 81% on room air with activity (see other OT note with template from today for further details). She will continue to benefit from OT services to maximize her safety & independence with self-care tasks. OT anticipates she will require at least some in home supervision for added safety, should she opt to return home at discharge.    Recommendations for follow up therapy are one component of a multi-disciplinary discharge planning process, led by the attending physician.  Recommendations may be updated based on patient status, additional functional criteria and insurance authorization.    Assistance Recommended at Discharge Intermittent Supervision/Assistance  Patient can return home with the following  Assistance with cooking/housework;Direct supervision/assist for medications management;A little help with bathing/dressing/bathroom   Equipment Recommendations  Tub/shower seat       Precautions / Restrictions Precautions Precaution Comments:  (monitor O2 sats and heart rate) Restrictions Weight Bearing  Restrictions: No       Mobility     Transfers   Equipment used: Rolling walker (2 wheels) Transfers: Sit to/from Stand, Bed to chair/wheelchair/BSC Sit to Stand: Supervision     Step pivot transfers: Supervision, Min guard (occasional min guard assist required)               ADL either performed or assessed with clinical judgement   ADL Overall ADL's : Needs assistance/impaired Eating/Feeding: Independent;Sitting Eating/Feeding Details (indicate cue type and reason): based on clinical judgement Grooming: Set up;Sitting;Standing Grooming Details (indicate cue type and reason): She performed face washing & teeth brushing standing at the sink, and applying lotion in her extremities seated in the chair.     Lower Body Bathing: Set up;Supervison/ safety   Upper Body Dressing : Supervision/safety;Set up;Sitting Upper Body Dressing Details (indicate cue type and reason): She donned an overhead shirt in sitting. Lower Body Dressing: Set up;Supervision/safety;Sit to/from stand Lower Body Dressing Details (indicate cue type and reason): She donned a pair of pants over her ankles in sitting, then up over her hips in standing.               General ADL Comments: The pt was observed bathing at the sink, upon OT entering her room. She further went on to participate in other self-care tasks, both in sitting & occasionally in standing at the sink, including upper body dressing(donning a shirt), donning pants, and teeth brushing. She was educated on general safety during tasks and implementing deep breathing exercises as needed, given her shortness of breath, compromised endurance and intermittent O2 desaturation.               Cognition Arousal/Alertness: Awake/alert Behavior During Therapy: WFL for tasks assessed/performed   Area of Impairment:  Safety/judgement, Awareness          Safety/Judgement: Decreased awareness of safety, Decreased awareness of deficits      General Comments: Oriented x4, able to follow commands, required intermittent cues for safety awareness, decreased insight into deficits (suspect baseline)                   Pertinent Vitals/ Pain       Pain Assessment Pain Assessment: 0-10 Pain Score: 10-Worst pain ever Pain Location: She reported pain "all over" Pain Intervention(s): Patient requesting pain meds-RN notified         Frequency  Min 1X/week        Progress Toward Goals  OT Goals(current goals can now be found in the care plan section)  Progress towards OT goals: Progressing toward goals  Acute Rehab OT Goals Patient Stated Goal: to get better and return home OT Goal Formulation: With patient Time For Goal Achievement: 10/17/22 Potential to Achieve Goals: Good  Plan Discharge plan remains appropriate       AM-PAC OT "6 Clicks" Daily Activity     Outcome Measure   Help from another person eating meals?: None Help from another person taking care of personal grooming?: None Help from another person toileting, which includes using toliet, bedpan, or urinal?: A Little Help from another person bathing (including washing, rinsing, drying)?: A Little Help from another person to put on and taking off regular upper body clothing?: None Help from another person to put on and taking off regular lower body clothing?: A Little 6 Click Score: 21    End of Session Equipment Utilized During Treatment: Oxygen;Rolling walker (2 wheels);Gait belt  OT Visit Diagnosis: Unsteadiness on feet (R26.81)   Activity Tolerance Other (comment) (Limited by compromised endurance and shortness of breath with activity)   Patient Left in bed;with call bell/phone within reach;with nursing/sitter in room   Nurse Communication Other (comment) (O2 saturation levels and heart rate)        Time: 1010-1047 OT Time Calculation (min): 37 min  Charges: OT General Charges $OT Visit: 1 Visit OT Treatments $Self Care/Home  Management : 23-37 mins     Reuben Likes, OTR/L 10/10/2022, 12:23 PM

## 2022-10-10 NOTE — Telephone Encounter (Signed)
Please schedule patient for hospital follow up with me or APP in 7-10 days for COPD exacerbation.  Thanks, JD

## 2022-10-10 NOTE — Progress Notes (Signed)
PROGRESS NOTE    GAILYN MONZO  ZOX:[id number] DOB: 09/14/1955 DOA: 10/02/2022 PCP: Patient, No Pcp Per    Brief Narrative:   AISHI DEVILLIERS is a 67 y.o. female with past medical history significant for COPD/asthma, HTN, chronic pain syndrome, OA, tobacco use disorder who presented to [facility] ED from home via EMS on 5/7 with progressive shortness of breath, cough.  Onset last 2-3 days.  Initially endorses some chest pains/tightness.  Denies fever/chills, no sick contacts.  On EMS arrival, patient was noted to be tachypneic with wheezing and received DuoNebs x 3 and IV Solu-Medrol; placed on BiPAP and patient was subsequently transported to the ED for further evaluation.  In the ED, temperature 98.3 F, HR 93, RR 17, BP 127/68, SpO2 100% on BiPAP.  WBC 8.2, hemoglobin 11.7, platelet count 230.  Sodium 135, potassium 2.6, chloride 96, CO2 29, glucose 153, BUN 9, creatinine 0.58.  High sensitive troponin 5 followed by 6.  Chest x-ray with increased bronchial thickening from prior exam, may be acute bronchitis or COPD exacerbation, mild to advanced emphysema.  Patient received IV steroids, DuoNeb, IV potassium, albuterol neb, fentanyl, and morphine.  EDP consulted [facility] for admission for further evaluation management of acute respiratory failure secondary to COPD exacerbation requiring BiPAP.  Assessment & Plan:    COPD exacerbation Acute hypoxic/hypercarbic respiratory failure Patient presenting to ED with progressive shortness of breath, wheezing.  Denies sick contacts.  Patient is afebrile without leukocytosis.  Chest x-ray with increased bronchial thickening from prior exam without focal consolidation.  Patient received IV steroids, 3 nebs by EMS and was placed on BiPAP and transported to the ED for further evaluation.  Patient was eventually titrated off of BiPAP to nasal cannula.  On 10/06/2022, patient developed increased respiratory distress, confusion and was transferred to the  stepdown unit.  ABG with pH 7.33, pCO2 72, pO2 193. -- PCCM following, appreciate assistance -- Solu-Medrol in addition to prednisone taper today -- Completed 5-day course of ceftriaxone -- Brovana neb twice daily -- Budesonide neb twice daily -- Yupelri 175 mcg neb daily -- Albuterol neb every 2 hours as needed shortness of breath/wheezing -- Continue supplemental oxygen, maintain SpO2 > 88%; currently on 3 L nasal cannula  -- Intolerant of CPAP/BiPAP -- Ambulatory O2 screen today  Hypokalemia: Resolved Repleted during hospitalization.  Essential hypertension Home regimen includes enalapril-HCTZ 10-25 mg p.o. daily. -- Restarted enalapril at 5 mg p.o. daily -- Continue monitor BP closely and adjust accordingly  Chronic pain syndrome -- Continue home regimen Percocet/oxycodone  GERD -- Protonix 80 mg p.o. daily as needed for heartburn/indigestion  Tobacco use disorder Counseled on need for complete cessation/abstinence.  Anxiety Hx Mood disorder NOS Last hospitalized at [facility] for concern of auditory/visual hallucinations and questionable suicidal ideation in 2011.  Was initially started on Abilify but side effects of nausea/dizziness and was changed to Geodon.  Currently not on any anxiety, depressive or mood stabilizer currently.  During this hospital course, patient has had multiple fluctuations in anxiety. -- Started Zoloft 100 mg p.o. daily -- Started Buspar 15mg  PO BID -- Ativan 1 mg p.o. every 6 hours as needed anxiety -- Haldol as needed agitation  Severe protein calorie malnutrition Body mass index is 18.52 kg/m.  Nutrition Status: Nutrition Problem: Severe Malnutrition Etiology: chronic illness (COPD) Signs/Symptoms: severe fat depletion, severe muscle depletion Interventions: Ensure Enlive (each supplement provides 350kcal and 20 grams of protein), MVI, Magic cup -- Dietitian following, diet liberalized to regular,  continue to encourage increased oral intake,  supplementation  Weakness/debility/deconditioning/gait disturbance: -- Continue PT/OT efforts while inpatient   DVT prophylaxis: enoxaparin (LOVENOX) injection 30 mg Start: 10/04/22 1000    Code Status: Full Code Family Communication: No family present at bedside this morning  Disposition Plan:  Level of care: Telemetry Status is: Inpatient Remains inpatient appropriate because: Transitioning IV steroids to oral today, needs ambulatory O2 screen, anticipate likely discharge home tomorrow    Consultants:  PCCM Palliative care  Procedures:  None  Antimicrobials:  Ceftriaxone 5/7>>   Subjective: Patient seen examined at bedside, sitting at edge of bed eating breakfast.  NT present at bedside.  Intolerant of BiPAP once again overnight.  Remains on 3 L nasal cannula although NT reports that she had this off when they came in the room this morning.  NT reported that her oxygen saturation off of the oxygen at rest was 88%.  Discussed with patient we will be transitioning to oral steroids today, needs to evaluate an ambulatory O2 screen as anticipate need for home oxygen when she returns.  Patient not excited about using oxygen, but seems to now have some understanding.  Breathing improved.  Did threaten AMA last night, but decided to stay for further treatment.  No other specific complaints or concerns at this time. Discussed with RN. Denies headache, no dizziness, no chest pain, no palpitations, no abdominal pain, no fever/chills/night sweats, no nausea/vomiting/diarrhea.  No acute concerns overnight per nursing staff.   Objective: Vitals:   10/10/22 0349 10/10/22 0401 10/10/22 0446 10/10/22 0758  BP:   136/73   Pulse:  (!) 59 (!) 53   Resp:  20 20   Temp:   98.2 F (36.8 C)   TempSrc:   Oral   SpO2: 95% 97% (!) 88% 92%  Weight:      Height:        Intake/Output Summary (Last 24 hours) at 10/10/2022 1007 Last data filed at 10/10/2022 0981 Gross per 24 hour  Intake 240 ml   Output 0 ml  Net 240 ml   Filed Weights   10/02/22 1843  Weight: 44.5 kg    Examination:  Physical Exam: GEN: NAD, thin/chronically ill in appearance, appears older than stated age HEENT: NCAT, PERRL, EOMI, sclera clear, MMM, poor dentition PULM: Soft late expiratory wheezing appreciated throughout all lung fields, no crackles, normal respiratory effort without accessory muscle use, currently on 3 L nasal cannula  CV: RRR w/o M/G/R GI: abd soft, NTND, NABS, no R/G/M MSK: no peripheral edema, moves all extremities independently NEURO: No focal neurological deficits PSYCH: Anxious mood Integumentary: No appreciable rashes/lesions/wounds noted on exposed skin surfaces    Data Reviewed: I have personally reviewed following labs and imaging studies  CBC: Recent Labs  Lab 10/07/22 0510 10/09/22 0531  WBC 9.2 7.8  HGB 11.6* 12.2  HCT 38.1 39.4  MCV 96.5 95.2  PLT 273 287   Basic Metabolic Panel: Recent Labs  Lab 10/04/22 0524 10/06/22 0713 10/07/22 0510 10/09/22 0531  NA 138 138 138 140  K 5.0 4.3 4.4 4.2  CL 103 97* 97* 95*  CO2 31 33* 32 37*  GLUCOSE 115* 123* 111* 139*  BUN 16 21 20  26*  CREATININE 0.62 0.54 0.61 0.64  CALCIUM 8.6* 8.8* 8.9 9.0  MG 2.3  --  2.1 2.2   GFR: Estimated Creatinine Clearance: 46.6 mL/min (by C-G formula based on SCr of 0.64 mg/dL). Liver Function Tests: Recent Labs  Lab 10/07/22 0510  AST 19  ALT 29  ALKPHOS 39  BILITOT 0.5  PROT 6.0*  ALBUMIN 3.4*   No results for input(s): "LIPASE", "AMYLASE" in the last 168 hours. No results for input(s): "AMMONIA" in the last 168 hours. Coagulation Profile: No results for input(s): "INR", "PROTIME" in the last 168 hours. Cardiac Enzymes: No results for input(s): "CKTOTAL", "CKMB", "CKMBINDEX", "TROPONINI" in the last 168 hours. BNP (last 3 results) No results for input(s): "PROBNP" in the last 8760 hours. HbA1C: No results for input(s): "HGBA1C" in the last 72  hours. CBG: Recent Labs  Lab 10/04/22 1513 10/06/22 1546  GLUCAP 92 116*   Lipid Profile: No results for input(s): "CHOL", "HDL", "LDLCALC", "TRIG", "CHOLHDL", "LDLDIRECT" in the last 72 hours. Thyroid Function Tests: No results for input(s): "TSH", "T4TOTAL", "FREET4", "T3FREE", "THYROIDAB" in the last 72 hours. Anemia Panel: No results for input(s): "VITAMINB12", "FOLATE", "FERRITIN", "TIBC", "IRON", "RETICCTPCT" in the last 72 hours. Sepsis Labs: No results for input(s): "PROCALCITON", "LATICACIDVEN" in the last 168 hours.  No results found for this or any previous visit (from the past 240 hour(s)).       Radiology Studies: No results found.      Scheduled Meds:  arformoterol  15 mcg Nebulization BID   budesonide (PULMICORT) nebulizer solution  0.5 mg Nebulization BID   busPIRone  15 mg Oral BID   Chlorhexidine Gluconate Cloth  6 each Topical Daily   enalapril  5 mg Oral Daily   enoxaparin (LOVENOX) injection  30 mg Subcutaneous Q24H   feeding supplement  237 mL Oral TID BM   LORazepam  1 mg Intravenous Once   multivitamin with minerals  1 tablet Oral Daily   mouth rinse  15 mL Mouth Rinse 4 times per day   predniSONE  40 mg Oral Q breakfast   Followed by   Melene Muller ON 10/14/2022] predniSONE  30 mg Oral Q breakfast   Followed by   Melene Muller ON 10/18/2022] predniSONE  20 mg Oral Q breakfast   Followed by   Melene Muller ON 10/22/2022] predniSONE  10 mg Oral Q breakfast   Followed by   Melene Muller ON 10/26/2022] predniSONE  5 mg Oral Q breakfast   revefenacin  175 mcg Nebulization Daily   sertraline  100 mg Oral Daily   sodium chloride HYPERTONIC  4 mL Nebulization BID   zolpidem  7.5 mg Oral QHS   Continuous Infusions:     LOS: 8 days    Time spent: 51 minutes spent on chart review, discussion with nursing staff, consultants, updating family and interview/physical exam; more than 50% of that time was spent in counseling and/or coordination of care.     Alvira Philips [name],  DO [facility] Available via Epic secure chat 7am-7pm After these hours, please refer to coverage provider listed on [email or web address] 10/10/2022, 10:07 AM

## 2022-10-10 NOTE — Progress Notes (Signed)
Daily Progress Note   Patient Name: [name]       Date: 10/10/2022 DOB: [birth year]  Age: 66 y.o. MRN#: [id number] Attending Physician: [name], [name], DO Primary Care Physician: Patient, No Pcp Per Admit Date: 10/02/2022  Reason for Consultation/Follow-up: Establishing goals of care  Subjective: Tearful today, doesn't elaborate, denies pain/dyspnea currently. Asked her if she thinks spiritual care visit would be of benefit, she nods "yes", will reach out to chaplain.   Length of Stay: 8  Current Medications: Scheduled Meds:   arformoterol  15 mcg Nebulization BID   budesonide (PULMICORT) nebulizer solution  0.5 mg Nebulization BID   busPIRone  15 mg Oral BID   Chlorhexidine Gluconate Cloth  6 each Topical Daily   enalapril  5 mg Oral Daily   enoxaparin (LOVENOX) injection  30 mg Subcutaneous Q24H   feeding supplement  237 mL Oral TID BM   LORazepam  1 mg Intravenous Once   multivitamin with minerals  1 tablet Oral Daily   mouth rinse  15 mL Mouth Rinse 4 times per day   predniSONE  40 mg Oral Q breakfast   Followed by   Melene Muller ON 10/14/2022] predniSONE  30 mg Oral Q breakfast   Followed by   Melene Muller ON 10/18/2022] predniSONE  20 mg Oral Q breakfast   Followed by   Melene Muller ON 10/22/2022] predniSONE  10 mg Oral Q breakfast   Followed by   Melene Muller ON 10/26/2022] predniSONE  5 mg Oral Q breakfast   revefenacin  175 mcg Nebulization Daily   sertraline  100 mg Oral Daily   sodium chloride HYPERTONIC  4 mL Nebulization BID   zolpidem  7.5 mg Oral QHS    Continuous Infusions:   PRN Meds: albuterol, alum & mag hydroxide-simeth, haloperidol lactate, hydrALAZINE, LORazepam, morphine injection, mouth rinse, oxyCODONE-acetaminophen **AND** oxyCODONE, pantoprazole, sodium  chloride  Physical Exam         Thin chronically ill appearing Is on O2 [place] Wheezes in bases Awake, anxious, labile mood and tearful today  Vital Signs: BP (!) 145/89   Pulse (!) 53   Temp 98.2 F (36.8 C) (Oral)   Resp 20   Ht 5\' 1"  (1.549 m)   Wt 44.5 kg   SpO2 92%   BMI 18.52 kg/m  SpO2: SpO2: 92 %  O2 Device: O2 Device: Nasal Cannula O2 Flow Rate: O2 Flow Rate (L/min): 3 L/min  Intake/output summary:  Intake/Output Summary (Last 24 hours) at 10/10/2022 1210 Last data filed at 10/10/2022 1008 Gross per 24 hour  Intake 360 ml  Output 0 ml  Net 360 ml   LBM: Last BM Date : 10/06/22 Baseline Weight: Weight: 44.5 kg Most recent weight: Weight: 44.5 kg       Palliative Assessment/Data:      Patient Active Problem List   Diagnosis Date Noted   Vocal cord dysfunction 10/08/2022   Anxiety 10/08/2022   Protein-calorie malnutrition, severe 10/04/2022   COPD with acute exacerbation (HCC) 10/02/2022   Grief 06/03/2022   Pain of left upper extremity 12/28/2016   Elevated TSH 11/08/2016   Gastroesophageal reflux disease 11/08/2016   Constipation 11/08/2016   COPD (chronic obstructive pulmonary disease) (HCC) 11/08/2016   Tobacco use 11/08/2016   Atypical chest pain 11/07/2016   Unstable angina (HCC) 01/07/2016   HTN (hypertension) 01/07/2016   Acute blood loss anemia 11/06/2013   Osteoarthritis of left hip 11/05/2013   History of total left hip replacement 11/05/2013   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   Recommendations/Plan:  Spiritual care consult for additional support.  Continue current pain regimen. Recommend continued outpatient follow up at [facility] pain clinic.  Continue to encourage patient regarding code  status discussions of differences between full code versus DNR DNI and also about the importance of advance care planning document preparation and the designation of a HCPOA agent. Not able to elaborate on this today, due to patient's tearfulness.   Goals of Care and Additional Recommendations: Limitations on Scope of Treatment: Full Scope Treatment  Code Status:    Code Status Orders  (From admission, onward)           Start     Ordered   10/02/22 2215  Full code  Continuous       Question:  By:  Answer:  Consent: discussion documented in EHR   10/02/22 2216           Code Status History     Date Active Date Inactive Code Status Order ID Comments User Context   06/02/2022 2027 06/03/2022 1650 Full Code [id number]  Gerhard Munch, MD ED   11/07/2016 1353 11/07/2016 2308 Full Code [id number]  Beather Arbour, MD Inpatient   01/07/2016 1057 01/08/2016 1722 Full Code [id number]  Beather Arbour, MD ED   11/05/2013 1105 11/09/2013 1324 Full Code [id number]  Jacki Cones, MD Inpatient   05/14/2012 1306 05/16/2012 1353 Full Code [id number]  Iantha Fallen, RN Inpatient       Prognosis:  Unable to determine  Discharge Planning: To Be Determined  Care plan was discussed with  patient.   Thank you for allowing the Palliative Medicine Team to assist in the care of this patient.  Mod MDM     Greater than 50%  of this time was spent counseling and coordinating care related to the above assessment and plan.  Rosalin Hawking, MD  Please contact Palliative Medicine Team phone at [phone] for questions and concerns.

## 2022-10-10 NOTE — Progress Notes (Signed)
Pt stated she does not want to try bipap again until after pain meds given.  RN aware and will advise RT when patient is ready / willing to try bipap.

## 2022-10-10 NOTE — Progress Notes (Signed)
Chaplain followed up with [name] who has been very anxious about her health. Chaplain offered supportive presence and reflective listening as she shared her feelings.   Chaplain will follow-up tomorrow before discharge for prayer.     10/10/22 1400  Spiritual Encounters  Type of Visit Follow up  Care provided to: Patient  Reason for visit Routine spiritual support

## 2022-10-10 NOTE — Progress Notes (Signed)
Bipap placed on patient at approximately 0340, at time of entry patient called out for removal of Bipap,"I tried it, I just can't do this." Requested nasal cannula to be placed, done as requested Faith at 3lpm. Patient in no acute distress will inform RT.

## 2022-10-10 NOTE — Progress Notes (Signed)
SATURATION QUALIFICATIONS: (This note is used to comply with regulatory documentation for home oxygen)  Patient Saturations on Room Air at Rest = 89%  Patient Saturations on Room Air while Ambulating = 84%  Patient Saturations on 2 Liters of oxygen while Ambulating = 95%  Please briefly explain why patient needs home oxygen: Pt is unable to sustain O2 sats above 90% for any amount of time.  As soon as pt would begin bathing self, dressing self or walking, pts sats immediately dropped into the 80s. Pt reached 84% at lowest point.    Of note, pts HR at times during activity today would jump from 60s into 130s as high as 135. Nursing notified.   Tory Emerald, [place] [phone]

## 2022-10-10 NOTE — Progress Notes (Signed)
NAME:  [name], MRN:  [id number], DOB:  1955/10/02, LOS: 8 ADMISSION DATE:  10/02/2022, CONSULTATION DATE:  10/10/2022  REFERRING MD:  [name] [facility], CHIEF COMPLAINT: Respiratory distress  History of Present Illness:  67 year old with COPD/asthma admitted 5/7 with shortness of breath and cough for 3 days.  Initially required BiPAP in the emergency room, treated for COPD exacerbation with IV steroids, budesonide/Brovana/Yupelri combination and IV ceftriaxone.  She was felt to be improving by 5/10 and was able to ambulate without oxygen but developed sudden respiratory distress on 5/11, requiring rapid response and transfer to ICU for BiPAP  Pertinent  Medical History  Chronic pain on narcotics Anxiety/psychosis COPD/asthma -PFTs 2018 show moderate airway obstruction with FEV1 67%, improved to 80% postbronchodilator, DLCO 45%.    Significant Hospital Events: Including procedures, antibiotic start and stop dates in addition to other pertinent events     Interim History / Subjective:   Tried bipap overnight but did not tolerate She is in hallway ambulating with walk this morning without issues  Objective   Blood pressure 136/73, pulse (!) 53, temperature 98.2 F (36.8 C), temperature source Oral, resp. rate 20, height 5\' 1"  (1.549 m), weight 44.5 kg, SpO2 92 %.        Intake/Output Summary (Last 24 hours) at 10/10/2022 0802 Last data filed at 10/10/2022 4098 Gross per 24 hour  Intake 600 ml  Output 0 ml  Net 600 ml   Filed Weights   10/02/22 1843  Weight: 44.5 kg    Examination: General: Frail elderly appearing female, no acute distress HENT: [place]/AT, PERRL, no JVD Lungs: no wheezing, diminished air movement Cardiovascular: RRR, no MRG Abdomen: Soft, NT, ND Extremities: No acute deformity or ROM limitation. No edema.  Neuro: Alert, oriented, non-focal   Resolved Hospital Problem list     Assessment & Plan:  Acute on chronic hypercarbic respiratory failure  Severe  emphysema/hyperinflation COPD/asthma -some reversibility on previous PFTs -Budesonide/Brovana/Yupelri regimen with albuterol PRN -Transitioned to prolonged oral prednisone taper today - s/p 5 days CTX -Discharge regimen can be Advair plus Spiriva or Trelegy 100 if this is covered on her insurance - Goal SpO2 is 92%, wean O2 as able  Anxiety -Zoloft, Buspar agree. Per primary -PRN benzo OK in setting VCD, but haldol preferred as ordered.  -She indicates symptomatic relief specifically with morphine, continue PRN  Goal will be to wean off O2 prior to discharge. Will need ambulatory O2 assessments.   PCCM will continue to follow, expect she will be ready for discharge tomorrow.  Best Practice (right click and "Reselect all SmartList Selections" daily)   Diet/type: Regular consistency (see orders) DVT prophylaxis: LMWH GI prophylaxis: N/A Lines: N/A Foley:  N/A Code Status:  full code Last date of multidisciplinary goals of care discussion [Full code]  Labs   CBC: Recent Labs  Lab 10/07/22 0510 10/09/22 0531  WBC 9.2 7.8  HGB 11.6* 12.2  HCT 38.1 39.4  MCV 96.5 95.2  PLT 273 287    Basic Metabolic Panel: Recent Labs  Lab 10/04/22 0524 10/06/22 0713 10/07/22 0510 10/09/22 0531  NA 138 138 138 140  K 5.0 4.3 4.4 4.2  CL 103 97* 97* 95*  CO2 31 33* 32 37*  GLUCOSE 115* 123* 111* 139*  BUN 16 21 20  26*  CREATININE 0.62 0.54 0.61 0.64  CALCIUM 8.6* 8.8* 8.9 9.0  MG 2.3  --  2.1 2.2   GFR: Estimated Creatinine Clearance: 46.6 mL/min (by C-G formula based on  SCr of 0.64 mg/dL). Recent Labs  Lab 10/07/22 0510 10/09/22 0531  WBC 9.2 7.8    Liver Function Tests: Recent Labs  Lab 10/07/22 0510  AST 19  ALT 29  ALKPHOS 39  BILITOT 0.5  PROT 6.0*  ALBUMIN 3.4*   No results for input(s): "LIPASE", "AMYLASE" in the last 168 hours. No results for input(s): "AMMONIA" in the last 168 hours.  ABG    Component Value Date/Time   PHART 7.33 (L) 10/06/2022 1013    PCO2ART 72 (HH) 10/06/2022 1013   PO2ART 193 (H) 10/06/2022 1013   HCO3 43.7 (H) 10/09/2022 0531   TCO2 33 12/04/2015 1408   O2SAT 69.5 10/09/2022 0531     Coagulation Profile: No results for input(s): "INR", "PROTIME" in the last 168 hours.  Cardiac Enzymes: No results for input(s): "CKTOTAL", "CKMB", "CKMBINDEX", "TROPONINI" in the last 168 hours.  HbA1C: Hgb A1c MFr Bld  Date/Time Value Ref Range Status  11/07/2016 02:06 PM 5.6 4.8 - 5.6 % Final    Comment:    (NOTE)         Pre-diabetes: 5.7 - 6.4         Diabetes: >6.4         Glycemic control for adults with diabetes: <7.0     CBG: Recent Labs  Lab 10/04/22 1513 10/06/22 1546  GLUCAP 92 116*     Melody Comas, MD [facility] Office: [phone]   See Amion for personal pager PCCM on call pager [phone] until 7pm. Please call Elink 7p-7a. [phone]

## 2022-10-10 NOTE — Progress Notes (Signed)
Patient declines nocturnal BiPAP tonight. Equipment remains on standby if she should change her mind. She is aware that she may call for RT assistance at any time. RT will continue to follow.

## 2022-10-11 DIAGNOSIS — J441 Chronic obstructive pulmonary disease with (acute) exacerbation: Secondary | ICD-10-CM | POA: Diagnosis not present

## 2022-10-11 LAB — QUANTIFERON-TB GOLD PLUS: QuantiFERON-TB Gold Plus: NEGATIVE

## 2022-10-11 LAB — ACID FAST SMEAR (AFB, MYCOBACTERIA): Acid Fast Smear: NEGATIVE

## 2022-10-11 LAB — QUANTIFERON-TB GOLD PLUS (RQFGPL)
QuantiFERON Mitogen Value: 4.36 IU/mL
QuantiFERON Nil Value: 0 IU/mL
QuantiFERON TB1 Ag Value: 0 IU/mL
QuantiFERON TB2 Ag Value: 0 IU/mL

## 2022-10-11 MED ORDER — LORAZEPAM 1 MG PO TABS: 1.0000 mg | ORAL_TABLET | Freq: Four times a day (QID) | ORAL | 0 refills | Status: DC | PRN

## 2022-10-11 MED ORDER — BUSPIRONE HCL 15 MG PO TABS: 15.0000 mg | ORAL_TABLET | Freq: Two times a day (BID) | ORAL | 2 refills | Status: DC

## 2022-10-11 MED ORDER — PREDNISONE 10 MG PO TABS: ORAL_TABLET | ORAL | 0 refills | Status: AC

## 2022-10-11 MED ORDER — SERTRALINE HCL 100 MG PO TABS: 100.0000 mg | ORAL_TABLET | Freq: Every day | ORAL | 2 refills | Status: DC

## 2022-10-11 MED ORDER — TRELEGY ELLIPTA 200-62.5-25 MCG/ACT IN AEPB: 1.0000 | INHALATION_SPRAY | Freq: Every day | RESPIRATORY_TRACT | 2 refills | Status: DC

## 2022-10-11 MED ORDER — ALBUTEROL SULFATE HFA 108 (90 BASE) MCG/ACT IN AERS: 2.0000 | INHALATION_SPRAY | RESPIRATORY_TRACT | 3 refills | Status: DC | PRN

## 2022-10-11 MED ORDER — ENALAPRIL MALEATE 10 MG PO TABS
10.0000 mg | ORAL_TABLET | Freq: Every day | ORAL | Status: DC
  Filled 2022-10-11: qty 1

## 2022-10-11 MED ORDER — ENALAPRIL MALEATE 10 MG PO TABS: 10.0000 mg | ORAL_TABLET | Freq: Every day | ORAL | 2 refills | Status: DC

## 2022-10-11 MED ORDER — ATORVASTATIN CALCIUM 10 MG PO TABS: 10.0000 mg | ORAL_TABLET | Freq: Every day | ORAL | 2 refills | Status: DC

## 2022-10-11 NOTE — Discharge Summary (Signed)
Physician Discharge Summary  [name] JXB:[id number] DOB: [birth year] DOA: 10/02/2022  PCP: Patient, No Pcp Per  Admit date: 10/02/2022 Discharge date: 10/11/2022  Admitted From: Home Disposition: Home  Recommendations for Outpatient Follow-up:  Follow up with PCP in 1-2 weeks Follow-up with pulmonology, Dr. Francine Graven to schedule on 10/25/2022 at 9:30 AM Continue prednisone taper on discharge for COPD exacerbation Started on Zoloft 100 mg p.o. daily, BuSpar 15 mg p.o. twice daily for anxiety disorder Started on Trelegy Ellipta inhaler Continued to encourage tobacco cessation  Home Health: RN/OT Equipment/Devices: Oxygen 2 L nasal cannula, rolling walker, 3 and 1 bedside commode, nebulizer  Discharge Condition: Stable CODE STATUS: Full code Diet recommendation: Regular diet  History of present illness:  [name] is a 67 y.o. female with past medical history significant for COPD/asthma, HTN, chronic pain syndrome, OA, tobacco use disorder who presented to [facility] ED from home via EMS on 5/7 with progressive shortness of breath, cough.  Onset last 2-3 days.  Initially endorses some chest pains/tightness.  Denies fever/chills, no sick contacts.  On EMS arrival, patient was noted to be tachypneic with wheezing and received DuoNebs x 3 and IV Solu-Medrol; placed on BiPAP and patient was subsequently transported to the ED for further evaluation.   In the ED, temperature 98.3 F, HR 93, RR 17, BP 127/68, SpO2 100% on BiPAP.  WBC 8.2, hemoglobin 11.7, platelet count 230.  Sodium 135, potassium 2.6, chloride 96, CO2 29, glucose 153, BUN 9, creatinine 0.58.  High sensitive troponin 5 followed by 6.  Chest x-ray with increased bronchial thickening from prior exam, may be acute bronchitis or COPD exacerbation, mild to advanced emphysema.  Patient received IV steroids, DuoNeb, IV potassium, albuterol neb, fentanyl, and morphine.  EDP consulted [facility] for admission for further evaluation  management of acute respiratory failure secondary to COPD exacerbation requiring BiPAP.  Hospital course:  COPD exacerbation Acute hypoxic/hypercarbic respiratory failure Patient presenting to ED with progressive shortness of breath, wheezing.  Denies sick contacts.  Patient is afebrile without leukocytosis.  Chest x-ray with increased bronchial thickening from prior exam without focal consolidation.  Patient received IV steroids, 3 nebs by EMS and was placed on BiPAP and transported to the ED for further evaluation.  Patient was eventually titrated off of BiPAP to nasal cannula.  On 10/06/2022, patient developed increased respiratory distress, confusion and was transferred to the stepdown unit.  ABG with pH 7.33, pCO2 72, pO2 193.  Pulmonology was consulted and followed during hospital course.  Patient initially tolerated BiPAP with improvement of symptoms but refused to wear throughout the rest of her hospitalization.  Patient completed 5-day course of ceftriaxone.  Patient was continued on IV steroids, budesonide, Yupelri nebs with improvement of respiratory status and oxygen was titrated down to 2 L nasal cannula.  Ambulatory O2 screen at time of discharge notable for hypoxia with exertion requiring 2 L of nasal cannula at baseline.  Will discharge home on prednisone taper, new start on Trelegy Ellipta inhaler.  Continue encourage tobacco cessation.  Albuterol neb as needed.  Outpatient follow-up with pulmonology scheduled on 10/25/2022 at 9:30 AM with Dr. Francine Graven.    Hypokalemia: Resolved Repleted during hospitalization.   Essential hypertension Enalapril 10 mg p.o. daily   Chronic pain syndrome Continue home pain medication regimen   GERD Continue omeprazole   Tobacco use disorder Counseled on need for complete cessation/abstinence.   Anxiety Hx Mood disorder NOS Last hospitalized at [facility] for concern of auditory/visual  hallucinations and questionable suicidal ideation in 2011.  Was  initially started on Abilify but side effects of nausea/dizziness and was changed to Geodon.  Currently not on any anxiety, depressive or mood stabilizer currently.  During this hospital course, patient has had multiple fluctuations in anxiety. Started Zoloft 100 mg p.o. daily, Started Buspar 15mg  PO BID, Ativan 1 mg p.o. every 6 hours as needed anxiety   Severe protein calorie malnutrition Body mass index is 18.52 kg/m.  Nutrition Status: Nutrition Problem: Severe Malnutrition Etiology: chronic illness (COPD) Signs/Symptoms: severe fat depletion, severe muscle depletion Interventions: Ensure Enlive (each supplement provides 350kcal and 20 grams of protein), MVI, Magic cup Dietitian consulted during hospitalization, diet liberalized to regular, continue to encourage increased oral intake, supplementation   Weakness/debility/deconditioning/gait disturbance: Seen by PT and OT, discharging with home health OT.  Discharge Diagnoses:  Principal Problem:   COPD with acute exacerbation (HCC) Active Problems:   Protein-calorie malnutrition, severe   Vocal cord dysfunction   Anxiety   Palliative care by specialist   Goals of care, counseling/discussion    Discharge Instructions  Discharge Instructions     Call MD for:  difficulty breathing, headache or visual disturbances   Complete by: As directed    Call MD for:  extreme fatigue   Complete by: As directed    Call MD for:  persistant dizziness or light-headedness   Complete by: As directed    Call MD for:  persistant nausea and vomiting   Complete by: As directed    Call MD for:  severe uncontrolled pain   Complete by: As directed    Call MD for:  temperature >100.4   Complete by: As directed    Diet - low sodium heart healthy   Complete by: As directed    Increase activity slowly   Complete by: As directed       Allergies as of 10/11/2022   No Known Allergies      Medication List     STOP taking these medications     enalapril-hydrochlorothiazide 10-25 MG tablet Commonly known as: VASERETIC   fluticasone-salmeterol 250-50 MCG/ACT Aepb Commonly known as: Advair Diskus   hydrochlorothiazide 25 MG tablet Commonly known as: HYDRODIURIL   lidocaine 5 % Commonly known as: Lidoderm       TAKE these medications    albuterol (2.5 MG/3ML) 0.083% nebulizer solution Commonly known as: PROVENTIL Take 3 mLs (2.5 mg total) by nebulization every 6 (six) hours as needed for wheezing or shortness of breath.   albuterol 108 (90 Base) MCG/ACT inhaler Commonly known as: VENTOLIN HFA Inhale 2 puffs into the lungs every 4 (four) hours as needed for wheezing or shortness of breath.   atorvastatin 10 MG tablet Commonly known as: LIPITOR Take 1 tablet (10 mg total) by mouth at bedtime.   busPIRone 15 MG tablet Commonly known as: BUSPAR Take 1 tablet (15 mg total) by mouth 2 (two) times daily.   enalapril 10 MG tablet Commonly known as: VASOTEC Take 1 tablet (10 mg total) by mouth daily.   LORazepam 1 MG tablet Commonly known as: ATIVAN Take 1 tablet (1 mg total) by mouth every 6 (six) hours as needed for anxiety.   multivitamin capsule Take 1 capsule by mouth daily.   omeprazole 40 MG capsule Commonly known as: PRILOSEC 1 cap by mouth in morning daily 1/2 hour before breakfast or other meds. What changed:  how much to take how to take this when to take this reasons to  take this additional instructions   oxyCODONE-acetaminophen 10-325 MG tablet Commonly known as: PERCOCET Take 1 tablet by mouth every 4 (four) hours as needed for pain.   predniSONE 10 MG tablet Commonly known as: DELTASONE Take 4 tablets (40 mg total) by mouth daily for 2 days, THEN 3 tablets (30 mg total) daily for 4 days, THEN 2 tablets (20 mg total) daily for 4 days, THEN 1 tablet (10 mg total) daily for 4 days. Start taking on: Oct 12, 2022   sertraline 100 MG tablet Commonly known as: ZOLOFT Take 1 tablet (100 mg  total) by mouth daily. Start taking on: Oct 12, 2022   Trelegy Ellipta 200-62.5-25 MCG/ACT Aepb Generic drug: Fluticasone-Umeclidin-Vilant Inhale 1 puff into the lungs daily at 6 (six) AM.   zolpidem 5 MG tablet Commonly known as: AMBIEN Take 5 mg by mouth at bedtime.               Durable Medical Equipment  (From admission, onward)           Start     Ordered   10/10/22 1558  For home use only DME Walker rolling  Once       Question Answer Comment  Walker: With 5 Inch Wheels   Patient needs a walker to treat with the following condition Unsteady gait      10/10/22 1557   10/10/22 1557  For home use only DME 3 n 1  Once        10/10/22 1556   10/10/22 1300  For home use only DME Cane  Once        10/10/22 1259   10/10/22 1258  For home use only DME oxygen  Once       Question Answer Comment  Length of Need Lifetime   Mode or (Route) Nasal cannula   Liters per Minute 2   Frequency Continuous (stationary and portable oxygen unit needed)   Oxygen conserving device Yes   Oxygen delivery system Gas      10/10/22 1258   10/04/22 1006  For home use only DME Shower stool  Once        10/04/22 1006   10/04/22 0913  For home use only DME Nebulizer machine  Once       Question Answer Comment  Patient needs a nebulizer to treat with the following condition COPD (chronic obstructive pulmonary disease) (HCC)   Length of Need Lifetime      10/04/22 0912            Follow-up Information     [facility] Follow up.   Specialty: Home Health Services Why: [facility] nursing/occupational therapy Contact information: [address] [place] [address] [phone]         [facility] Follow up.   Why: neb machine,shower chair, Contact information: [place] [phone].        Martina Sinner, MD. Go on 10/25/2022.   Specialty: Pulmonary Disease Why: 930am Contact information: [address] [place]  [address] [phone]                No Known Allergies  Consultations: PCCM   Procedures/Studies: DG CHEST PORT 1 VIEW  Result Date: 10/06/2022 CLINICAL DATA:  Respiratory distress. EXAM: PORTABLE CHEST 1 VIEW COMPARISON:  10/02/2022 FINDINGS: Lungs are hyperexpanded. The lungs are clear without focal pneumonia, edema, pneumothorax or pleural effusion. The cardiopericardial silhouette is within normal limits for size.  Bones are diffusely demineralized. Telemetry leads overlie the chest. IMPRESSION: No active disease. Electronically Signed   By: Kennith Center M.D.   On: 10/06/2022 10:48   DG Chest Port 1 View  Result Date: 10/02/2022 CLINICAL DATA:  Shortness of breath. Wheezing. EXAM: PORTABLE CHEST 1 VIEW COMPARISON:  Radiograph and CT 12/01/2021 FINDINGS: Normal heart size. Stable mediastinal contours. Moderate to advanced emphysema with chronic hyperinflation. There is increased bronchial thickening from prior exam. Blunting of the costophrenic angles typically due to hyperinflation rather than pleural effusions. No focal airspace disease. Pneumothorax. No acute osseous abnormalities are seen. IMPRESSION: 1. Increased bronchial thickening from prior exam, may be acute bronchitis or COPD exacerbation. 2. Moderate to advanced emphysema. Electronically Signed   By: Narda Rutherford M.D.   On: 10/02/2022 19:40     Subjective: Patient seen examined bedside, resting comfortably.  Lying in bed.  Discharging home today.  Continues with some mild shortness of breath but much improved and stable on 2 L nasal cannula with rest and exertion.  Has outpatient follow-up appointment with pulmonology scheduled on 5/30.  No other specific questions or concerns at this time.  Denies headache, no dizziness, no chest pain, no palpitations, no abdominal pain, no fever/chills/night sweats, no nausea/vomiting/diarrhea, no focal weakness, no fatigue, no paresthesias.  No acute events overnight per nurse  staff.  Discharge Exam: Vitals:   10/10/22 2116 10/11/22 0325  BP: (!) 141/75 (!) 163/83  Pulse: (!) 58 67  Resp: 20 20  Temp: 99.1 F (37.3 C) 98.3 F (36.8 C)  SpO2:  100%   Vitals:   10/10/22 1743 10/10/22 2046 10/10/22 2116 10/11/22 0325  BP: 139/77  (!) 141/75 (!) 163/83  Pulse: 60  (!) 58 67  Resp:   20 20  Temp:   99.1 F (37.3 C) 98.3 F (36.8 C)  TempSrc:   Oral Oral  SpO2:  100% 100% 100%  Weight:      Height:        Physical Exam: GEN: NAD, thin/chronically ill in appearance, appears older than stated age HEENT: NCAT, PERRL, EOMI, sclera clear, MMM, poor dentition PULM: Soft late expiratory wheezing appreciated throughout all lung fields, no crackles, normal respiratory effort without accessory muscle use, currently on 2 L nasal cannula  CV: RRR w/o M/G/R GI: abd soft, NTND, NABS, no R/G/M MSK: no peripheral edema, moves all extremities independently NEURO: No focal neurological deficits PSYCH: Anxious mood Integumentary: No appreciable rashes/lesions/wounds noted on exposed skin surfaces    The results of significant diagnostics from this hospitalization (including imaging, microbiology, ancillary and laboratory) are listed below for reference.     Microbiology: No results found for this or any previous visit (from the past 240 hour(s)).   Labs: BNP (last 3 results) Recent Labs    12/01/21 1830  BNP 7.9   Basic Metabolic Panel: Recent Labs  Lab 10/06/22 0713 10/07/22 0510 10/09/22 0531 10/10/22 1435  NA 138 138 140 137  K 4.3 4.4 4.2 4.2  CL 97* 97* 95* 93*  CO2 33* 32 37* 36*  GLUCOSE 123* 111* 139* 127*  BUN 21 20 26* 24*  CREATININE 0.54 0.61 0.64 0.54  CALCIUM 8.8* 8.9 9.0 9.0  MG  --  2.1 2.2 2.1   Liver Function Tests: Recent Labs  Lab 10/07/22 0510  AST 19  ALT 29  ALKPHOS 39  BILITOT 0.5  PROT 6.0*  ALBUMIN 3.4*   No results for input(s): "LIPASE", "AMYLASE" in the last 168 hours.  No results for input(s): "AMMONIA"  in the last 168 hours. CBC: Recent Labs  Lab 10/07/22 0510 10/09/22 0531  WBC 9.2 7.8  HGB 11.6* 12.2  HCT 38.1 39.4  MCV 96.5 95.2  PLT 273 287   Cardiac Enzymes: No results for input(s): "CKTOTAL", "CKMB", "CKMBINDEX", "TROPONINI" in the last 168 hours. BNP: Invalid input(s): "POCBNP" CBG: Recent Labs  Lab 10/04/22 1513 10/06/22 1546  GLUCAP 92 116*   D-Dimer No results for input(s): "DDIMER" in the last 72 hours. Hgb A1c No results for input(s): "HGBA1C" in the last 72 hours. Lipid Profile No results for input(s): "CHOL", "HDL", "LDLCALC", "TRIG", "CHOLHDL", "LDLDIRECT" in the last 72 hours. Thyroid function studies No results for input(s): "TSH", "T4TOTAL", "T3FREE", "THYROIDAB" in the last 72 hours.  Invalid input(s): "FREET3" Anemia work up No results for input(s): "VITAMINB12", "FOLATE", "FERRITIN", "TIBC", "IRON", "RETICCTPCT" in the last 72 hours. Urinalysis    Component Value Date/Time   COLORURINE YELLOW 06/02/2022 1909   APPEARANCEUR HAZY (A) 06/02/2022 1909   LABSPEC 1.019 06/02/2022 1909   PHURINE 5.0 06/02/2022 1909   GLUCOSEU NEGATIVE 06/02/2022 1909   HGBUR NEGATIVE 06/02/2022 1909   BILIRUBINUR NEGATIVE 06/02/2022 1909   KETONESUR NEGATIVE 06/02/2022 1909   PROTEINUR NEGATIVE 06/02/2022 1909   UROBILINOGEN 0.2 10/29/2013 1114   NITRITE NEGATIVE 06/02/2022 1909   LEUKOCYTESUR TRACE (A) 06/02/2022 1909   Sepsis Labs Recent Labs  Lab 10/07/22 0510 10/09/22 0531  WBC 9.2 7.8   Microbiology No results found for this or any previous visit (from the past 240 hour(s)).   Time coordinating discharge: Over 30 minutes  SIGNED:   Alvira Philips [name], DO  [facility] 10/11/2022, 9:14 AM

## 2022-10-11 NOTE — Progress Notes (Signed)
SATURATION QUALIFICATIONS: (This note is used to comply with regulatory documentation for home oxygen)  Patient Saturations on Room Air at Rest = 88%  Patient Saturations on Room Air while Ambulating = 83%  Patient Saturations on 3 Liters of oxygen while Ambulating = 98%  Please briefly explain why patient needs home oxygen: Patient's oxygen drops while not on oxygen therapy

## 2022-10-11 NOTE — Progress Notes (Signed)
Daily Progress Note   Patient Name: [name]       Date: 10/11/2022 DOB: 03/02/1956  Age: 67 y.o. MRN#: [id number] Attending Physician: [name], [name], DO Primary Care Physician: Patient, No Pcp Per Admit Date: 10/02/2022  Reason for Consultation/Follow-up: Establishing goals of care  Subjective: Appreciate chaplain visit on 5-15, ambulating in hallway, to be d/c today.   Length of Stay: 9  Current Medications: Scheduled Meds:   arformoterol  15 mcg Nebulization BID   budesonide (PULMICORT) nebulizer solution  0.5 mg Nebulization BID   busPIRone  15 mg Oral BID   Chlorhexidine Gluconate Cloth  6 each Topical Daily   enalapril  10 mg Oral Daily   enoxaparin (LOVENOX) injection  30 mg Subcutaneous Q24H   feeding supplement  237 mL Oral TID BM   LORazepam  1 mg Intravenous Once   multivitamin with minerals  1 tablet Oral Daily   mouth rinse  15 mL Mouth Rinse 4 times per day   predniSONE  40 mg Oral Q breakfast   Followed by   Melene Muller ON 10/14/2022] predniSONE  30 mg Oral Q breakfast   Followed by   Melene Muller ON 10/18/2022] predniSONE  20 mg Oral Q breakfast   Followed by   Melene Muller ON 10/22/2022] predniSONE  10 mg Oral Q breakfast   Followed by   Melene Muller ON 10/26/2022] predniSONE  5 mg Oral Q breakfast   revefenacin  175 mcg Nebulization Daily   sertraline  100 mg Oral Daily   zolpidem  7.5 mg Oral QHS    Continuous Infusions:   PRN Meds: albuterol, alum & mag hydroxide-simeth, haloperidol lactate, hydrALAZINE, LORazepam, morphine injection, mouth rinse, oxyCODONE-acetaminophen **AND** oxyCODONE, pantoprazole, sodium chloride  Physical Exam         Thin chronically ill appearing Is on O2 [place] Wheezes in bases Awake, alert  Ambulating in hallway  Vital Signs: BP (!) 163/83  (BP Location: Right Arm)   Pulse 67   Temp 98.3 F (36.8 C) (Oral)   Resp 20   Ht 5\' 1"  (1.549 m)   Wt 44.5 kg   SpO2 97%   BMI 18.52 kg/m  SpO2: SpO2: 97 % O2 Device: O2 Device: Nasal Cannula O2 Flow Rate: O2 Flow Rate (L/min): 3 L/min  Intake/output summary:  Intake/Output Summary (Last  24 hours) at 10/11/2022 1019 Last data filed at 10/10/2022 1855 Gross per 24 hour  Intake 240 ml  Output --  Net 240 ml    LBM: Last BM Date : 10/06/22 Baseline Weight: Weight: 44.5 kg Most recent weight: Weight: 44.5 kg       Palliative Assessment/Data:      Patient Active Problem List   Diagnosis Date Noted   Palliative care by specialist 10/10/2022   Goals of care, counseling/discussion 10/10/2022   Vocal cord dysfunction 10/08/2022   Anxiety 10/08/2022   Protein-calorie malnutrition, severe 10/04/2022   COPD with acute exacerbation (HCC) 10/02/2022   Grief 06/03/2022   Pain of left upper extremity 12/28/2016   Elevated TSH 11/08/2016   Gastroesophageal reflux disease 11/08/2016   Constipation 11/08/2016   COPD (chronic obstructive pulmonary disease) (HCC) 11/08/2016   Tobacco use 11/08/2016   Atypical chest pain 11/07/2016   Unstable angina (HCC) 01/07/2016   HTN (hypertension) 01/07/2016   Acute blood loss anemia 11/06/2013   Osteoarthritis of left hip 11/05/2013   History of total left hip replacement 11/05/2013   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   Recommendations/Plan:  Spiritual care consult appreciated.  Continue current pain regimen. Recommend continued outpatient follow up at [facility] clinic.  Continue to encourage patient regarding code status discussions of differences between full code versus DNR DNI  and also about the importance of advance care planning document preparation and the designation of a HCPOA agent. Recommend home based palliative care.   Goals of Care and Additional Recommendations: Limitations on Scope of Treatment: Full Scope Treatment  Code Status:    Code Status Orders  (From admission, onward)           Start     Ordered   10/02/22 2215  Full code  Continuous       Question:  By:  Answer:  Consent: discussion documented in EHR   10/02/22 2216           Code Status History     Date Active Date Inactive Code Status Order ID Comments User Context   06/02/2022 2027 06/03/2022 1650 Full Code [id number]  Gerhard Munch, MD ED   11/07/2016 1353 11/07/2016 2308 Full Code [id number]  Beather Arbour, MD Inpatient   01/07/2016 1057 01/08/2016 1722 Full Code [id number]  Beather Arbour, MD ED   11/05/2013 1105 11/09/2013 1324 Full Code [id number]  Jacki Cones, MD Inpatient   05/14/2012 1306 05/16/2012 1353 Full Code [id number]  Iantha Fallen, RN Inpatient       Prognosis:  Unable to determine  Discharge Planning: To Be Determined  Care plan was discussed with  patient.   Thank you for allowing the Palliative Medicine Team to assist in the care of this patient.  Mod MDM     Greater than 50%  of this time was spent counseling and coordinating care related to the above assessment and plan.  Rosalin Hawking, MD  Please contact Palliative Medicine Team phone at [phone] for questions and concerns.

## 2022-10-11 NOTE — Plan of Care (Signed)

## 2022-10-11 NOTE — TOC Transition Note (Signed)
Transition of Care [facility]) - CM/SW Discharge Note   Patient Details  Name: [name] MRN: [id number] Date of Birth: 07-25-1955  Transition of Care [facility]) CM/SW Contact:  Lanier Clam, RN Phone Number: 10/11/2022, 10:48 AM   Clinical Narrative: Require 02 sats documented in progress notes-Nsg notified. DME rw,3n1 to be delivered to rm prior d/c. 02 may already b in rm.HHC already set up. No further CM needs once all documentation in place.       Final next level of care: Home w Home Health Services Barriers to Discharge: No Barriers Identified   Patient Goals and CMS Choice CMS Medicare.gov Compare Post Acute Care list provided to:: Patient Choice offered to / list presented to : Patient  Discharge Placement                         Discharge Plan and Services Additional resources added to the After Visit Summary for     Discharge Planning Services: CM Consult Post Acute Care Choice: Home Health, Durable Medical Equipment          DME Arranged: 3-N-1, Oxygen DME Agency: [employer] Date DME Agency Contacted: 10/10/22 Time DME Agency Contacted: 1332 Representative spoke with at DME Agency: Vaughan Basta HH Arranged: RN, OT HH Agency: [facility] Date [facility] Agency Contacted: 10/10/22 Time HH Agency Contacted: 1332 Representative spoke with at [facility] Agency: Merry Proud  Social Determinants of Health (SDOH) Interventions SDOH Screenings   Food Insecurity: No Food Insecurity (10/03/2022)  Recent Concern: Food Insecurity - Food Insecurity Present (09/08/2022)  Housing: Low Risk  (10/03/2022)  Recent Concern: Housing - Medium Risk (09/08/2022)  Transportation Needs: No Transportation Needs (10/03/2022)  Utilities: Not At Risk (10/03/2022)  Depression (PHQ2-9): Low Risk  (10/05/2020)  Financial Resource Strain: Low Risk  (10/05/2020)  Social Connections: Moderately Integrated (10/05/2020)  Stress: No Stress Concern Present (10/05/2020)  Tobacco Use: High Risk (10/02/2022)      Readmission Risk Interventions    10/04/2022    9:50 AM  Readmission Risk Prevention Plan  Post Dischage Appt Complete  Medication Screening Complete  Transportation Screening Complete

## 2022-10-11 NOTE — Care Management Important Message (Signed)
Important Message  Patient Details IM Letter mailed due to discharge Name: [name] MRN: [id number] Date of Birth: 01-26-1956   Medicare Important Message Given:  Other (see comment)     Caren Macadam 10/11/2022, 2:41 PM

## 2022-10-11 NOTE — Progress Notes (Signed)
Discharge instructions and medication regimen explained to patient. DME equipment at bedside. All questions answered regarding follow-up appointment with pulmonologist. PIV removed. Patient awaiting on granddaughter Leavy Cella for discharge.

## 2022-10-11 NOTE — Progress Notes (Signed)
Mobility Specialist - Progress Note   10/11/22 0946  Oxygen Therapy  SpO2 97 %  O2 Device Nasal Cannula  Patient Activity (if Appropriate) Ambulating  Mobility  Activity Ambulated with assistance in hallway  Level of Assistance Standby assist, set-up cues, supervision of patient - no hands on  Assistive Device Front wheel walker  Distance Ambulated (ft) 95 ft  Activity Response Tolerated well  Mobility Referral Yes  $Mobility charge 1 Mobility  Mobility Specialist Start Time (ACUTE ONLY) 0914  Mobility Specialist Stop Time (ACUTE ONLY) 0944  Mobility Specialist Time Calculation (min) (ACUTE ONLY) 30 min   Pt received in bed and agreeable to mobility. No complaints during session. Pt to EOB for breakfast after session with all needs met.    Pre-mobility: 74 HR, 97% SpO2 (3L [place]) During mobility: 71 HR, 97% SpO2 (3L [place]) Post-mobility: 78 HR, 97%  SPO2 (3L [place])  Chief Technology Officer

## 2022-10-11 NOTE — Plan of Care (Signed)
  Problem: Education: Goal: Knowledge of General Education information will improve Description: Including pain rating scale, medication(s)/side effects and non-pharmacologic comfort measures 10/11/2022 0931 by Angelique Holm, RN Outcome: Completed/Met 10/11/2022 0931 by Angelique Holm, RN Outcome: Adequate for Discharge   Problem: Health Behavior/Discharge Planning: Goal: Ability to manage health-related needs will improve 10/11/2022 0931 by Angelique Holm, RN Outcome: Completed/Met 10/11/2022 0931 by Angelique Holm, RN Outcome: Adequate for Discharge   Problem: Clinical Measurements: Goal: Ability to maintain clinical measurements within normal limits will improve 10/11/2022 0931 by Angelique Holm, RN Outcome: Completed/Met 10/11/2022 0931 by Angelique Holm, RN Outcome: Adequate for Discharge Goal: Will remain free from infection 10/11/2022 0931 by Angelique Holm, RN Outcome: Completed/Met 10/11/2022 0931 by Angelique Holm, RN Outcome: Adequate for Discharge Goal: Diagnostic test results will improve 10/11/2022 0931 by Angelique Holm, RN Outcome: Completed/Met 10/11/2022 0931 by Angelique Holm, RN Outcome: Adequate for Discharge Goal: Respiratory complications will improve 10/11/2022 0931 by Angelique Holm, RN Outcome: Completed/Met 10/11/2022 0931 by Angelique Holm, RN Outcome: Adequate for Discharge Goal: Cardiovascular complication will be avoided 10/11/2022 0931 by Angelique Holm, RN Outcome: Completed/Met 10/11/2022 0931 by Angelique Holm, RN Outcome: Adequate for Discharge   Problem: Activity: Goal: Risk for activity intolerance will decrease 10/11/2022 0931 by Angelique Holm, RN Outcome: Completed/Met 10/11/2022 0931 by Angelique Holm, RN Outcome: Adequate for Discharge   Problem: Nutrition: Goal: Adequate nutrition will be maintained 10/11/2022 0931 by Angelique Holm, RN Outcome: Completed/Met 10/11/2022 0931 by Angelique Holm, RN Outcome: Adequate for Discharge    Problem: Coping: Goal: Level of anxiety will decrease 10/11/2022 0931 by Angelique Holm, RN Outcome: Completed/Met 10/11/2022 0931 by Angelique Holm, RN Outcome: Adequate for Discharge   Problem: Elimination: Goal: Will not experience complications related to bowel motility 10/11/2022 0931 by Angelique Holm, RN Outcome: Completed/Met 10/11/2022 0931 by Angelique Holm, RN Outcome: Adequate for Discharge Goal: Will not experience complications related to urinary retention 10/11/2022 0931 by Angelique Holm, RN Outcome: Completed/Met 10/11/2022 0931 by Angelique Holm, RN Outcome: Adequate for Discharge   Problem: Pain Managment: Goal: General experience of comfort will improve 10/11/2022 0931 by Angelique Holm, RN Outcome: Completed/Met 10/11/2022 0931 by Angelique Holm, RN Outcome: Adequate for Discharge   Problem: Safety: Goal: Ability to remain free from injury will improve 10/11/2022 0931 by Angelique Holm, RN Outcome: Completed/Met 10/11/2022 0931 by Angelique Holm, RN Outcome: Adequate for Discharge   Problem: Skin Integrity: Goal: Risk for impaired skin integrity will decrease 10/11/2022 0931 by Angelique Holm, RN Outcome: Completed/Met 10/11/2022 0931 by Angelique Holm, RN Outcome: Adequate for Discharge

## 2022-10-25 ENCOUNTER — Inpatient Hospital Stay: Payer: Medicare HMO | Admitting: Pulmonary Disease

## 2022-10-25 NOTE — Progress Notes (Deleted)
Synopsis: Referred in May 2024 for Hospital follow up for COPD Care  Subjective:   PATIENT ID: [name] GENDER: female DOB: 1956-05-07, MRN: [id number]  HPI  No chief complaint on file.  [name] is 67 year old woman, daily smoker with history of asthma/copd who is referred to pulmonary clinic for hospital follow up.     Past Medical History:  Diagnosis Date   Arthritis    Asthma    Chest pain 2006   Chest pain 10/2016   Chronic back pain    Hypertension    Sciatica    Shortness of breath    due to  smoking      Family History  Problem Relation Age of Onset   Heart attack Mother 34   Hypertension Sister    Diabetes type II Sister    Tongue cancer Brother    Heart attack Sister 99   Lung cancer Sister    Hypertension Sister      Social History   Socioeconomic History   Marital status: Single    Spouse name: Not on file   Number of children: Not on file   Years of education: Not on file   Highest education level: Not on file  Occupational History   Occupation: Retired  Tobacco Use   Smoking status: Every Day    Packs/day: 0.10    Years: 30.00    Additional pack years: 0.00    Total pack years: 3.00    Types: Cigarettes   Smokeless tobacco: Never   Tobacco comments:    Smoking since teenage years  Vaping Use   Vaping Use: Never used  Substance and Sexual Activity   Alcohol use: No   Drug use: No   Sexual activity: Never  Other Topics Concern   Not on file  Social History Narrative   Great-grandmother   Used to work as a Corporate treasurer, now volunteers w/ free time at [employer]   Social Determinants of [employer] Strain: Low Risk  (10/05/2020)   Overall Financial Resource Strain (CARDIA)    Difficulty of Paying Living Expenses: Not hard at all  Food Insecurity: No Food Insecurity (10/03/2022)   Hunger Vital Sign    Worried About Running Out of Food in the Last Year: Never true    Ran Out of Food in the Last Year:  Never true  Recent Concern: Food Insecurity - Food Insecurity Present (09/08/2022)   Hunger Vital Sign    Worried About Running Out of Food in the Last Year: Often true    Ran Out of Food in the Last Year: Sometimes true  Transportation Needs: No Transportation Needs (10/03/2022)   PRAPARE - Administrator, Civil Service (Medical): No    Lack of Transportation (Non-Medical): No  Physical Activity: Not on file  Stress: No Stress Concern Present (10/05/2020)   [employer] of Occupational Health - Occupational Stress Questionnaire    Feeling of Stress : Not at all  Social Connections: Moderately Integrated (10/05/2020)   Social Connection and Isolation Panel [NHANES]    Frequency of Communication with Friends and Family: More than three times a week    Frequency of Social Gatherings with Friends and Family: Three times a week    Attends Religious Services: More than 4 times per year    Active Member of Clubs or Organizations: Yes    Attends Banker Meetings: More than 4 times per year  Marital Status: Widowed  Intimate Partner Violence: Not At Risk (10/03/2022)   Humiliation, Afraid, Rape, and Kick questionnaire    Fear of Current or Ex-Partner: No    Emotionally Abused: No    Physically Abused: No    Sexually Abused: No     No Known Allergies   Outpatient Medications Prior to Visit  Medication Sig Dispense Refill   albuterol (PROVENTIL) (2.5 MG/3ML) 0.083% nebulizer solution Take 3 mLs (2.5 mg total) by nebulization every 6 (six) hours as needed for wheezing or shortness of breath. 75 mL 12   albuterol (VENTOLIN HFA) 108 (90 Base) MCG/ACT inhaler Inhale 2 puffs into the lungs every 4 (four) hours as needed for wheezing or shortness of breath. 18 g 3   atorvastatin (LIPITOR) 10 MG tablet Take 1 tablet (10 mg total) by mouth at bedtime. 30 tablet 2   busPIRone (BUSPAR) 15 MG tablet Take 1 tablet (15 mg total) by mouth 2 (two) times daily. 60 tablet 2    enalapril (VASOTEC) 10 MG tablet Take 1 tablet (10 mg total) by mouth daily. 30 tablet 2   Fluticasone-Umeclidin-Vilant (TRELEGY ELLIPTA) 200-62.5-25 MCG/ACT AEPB Inhale 1 puff into the lungs daily at 6 (six) AM. 28 each 2   LORazepam (ATIVAN) 1 MG tablet Take 1 tablet (1 mg total) by mouth every 6 (six) hours as needed for anxiety. 30 tablet 0   Multiple Vitamin (MULTIVITAMIN) capsule Take 1 capsule by mouth daily.     omeprazole (PRILOSEC) 40 MG capsule 1 cap by mouth in morning daily 1/2 hour before breakfast or other meds. (Patient taking differently: Take 40 mg by mouth daily as needed (for acid reflux).) 30 capsule 11   oxyCODONE-acetaminophen (PERCOCET) 10-325 MG tablet Take 1 tablet by mouth every 4 (four) hours as needed for pain.     predniSONE (DELTASONE) 10 MG tablet Take 4 tablets (40 mg total) by mouth daily for 2 days, THEN 3 tablets (30 mg total) daily for 4 days, THEN 2 tablets (20 mg total) daily for 4 days, THEN 1 tablet (10 mg total) daily for 4 days. 32 tablet 0   sertraline (ZOLOFT) 100 MG tablet Take 1 tablet (100 mg total) by mouth daily. 30 tablet 2   zolpidem (AMBIEN) 5 MG tablet Take 5 mg by mouth at bedtime.     No facility-administered medications prior to visit.    ROS    Objective:  There were no vitals filed for this visit.   Physical Exam    CBC    Component Value Date/Time   WBC 7.8 10/09/2022 0531   RBC 4.14 10/09/2022 0531   HGB 12.2 10/09/2022 0531   HCT 39.4 10/09/2022 0531   PLT 287 10/09/2022 0531   MCV 95.2 10/09/2022 0531   MCH 29.5 10/09/2022 0531   MCHC 31.0 10/09/2022 0531   RDW 13.3 10/09/2022 0531   LYMPHSABS 2.7 06/02/2022 1938   MONOABS 0.5 06/02/2022 1938   EOSABS 0.1 06/02/2022 1938   BASOSABS 0.0 06/02/2022 1938     Chest imaging:  PFT:    Latest Ref Rng & Units 11/20/2016    8:41 AM  PFT Results  FVC-Pre L 2.04   FVC-Predicted Pre % 90   FVC-Post L 2.54   FVC-Predicted Post % 113   Pre FEV1/FVC % % 58   Post  FEV1/FCV % % 55   FEV1-Pre L 1.18   FEV1-Predicted Pre % 67   FEV1-Post L 1.40   DLCO uncorrected ml/min/mmHg 9.28  DLCO UNC% % 45   DLVA Predicted % 53   TLC L 9.65   TLC % Predicted % 209   RV % Predicted % 384     Labs:  Path:  Echo:  Heart Catheterization:       Assessment & Plan:   No diagnosis found.  Discussion: ***    Current Outpatient Medications:    albuterol (PROVENTIL) (2.5 MG/3ML) 0.083% nebulizer solution, Take 3 mLs (2.5 mg total) by nebulization every 6 (six) hours as needed for wheezing or shortness of breath., Disp: 75 mL, Rfl: 12   albuterol (VENTOLIN HFA) 108 (90 Base) MCG/ACT inhaler, Inhale 2 puffs into the lungs every 4 (four) hours as needed for wheezing or shortness of breath., Disp: 18 g, Rfl: 3   atorvastatin (LIPITOR) 10 MG tablet, Take 1 tablet (10 mg total) by mouth at bedtime., Disp: 30 tablet, Rfl: 2   busPIRone (BUSPAR) 15 MG tablet, Take 1 tablet (15 mg total) by mouth 2 (two) times daily., Disp: 60 tablet, Rfl: 2   enalapril (VASOTEC) 10 MG tablet, Take 1 tablet (10 mg total) by mouth daily., Disp: 30 tablet, Rfl: 2   Fluticasone-Umeclidin-Vilant (TRELEGY ELLIPTA) 200-62.5-25 MCG/ACT AEPB, Inhale 1 puff into the lungs daily at 6 (six) AM., Disp: 28 each, Rfl: 2   LORazepam (ATIVAN) 1 MG tablet, Take 1 tablet (1 mg total) by mouth every 6 (six) hours as needed for anxiety., Disp: 30 tablet, Rfl: 0   Multiple Vitamin (MULTIVITAMIN) capsule, Take 1 capsule by mouth daily., Disp: , Rfl:    omeprazole (PRILOSEC) 40 MG capsule, 1 cap by mouth in morning daily 1/2 hour before breakfast or other meds. (Patient taking differently: Take 40 mg by mouth daily as needed (for acid reflux).), Disp: 30 capsule, Rfl: 11   oxyCODONE-acetaminophen (PERCOCET) 10-325 MG tablet, Take 1 tablet by mouth every 4 (four) hours as needed for pain., Disp: , Rfl:    predniSONE (DELTASONE) 10 MG tablet, Take 4 tablets (40 mg total) by mouth daily for 2 days, THEN 3  tablets (30 mg total) daily for 4 days, THEN 2 tablets (20 mg total) daily for 4 days, THEN 1 tablet (10 mg total) daily for 4 days., Disp: 32 tablet, Rfl: 0   sertraline (ZOLOFT) 100 MG tablet, Take 1 tablet (100 mg total) by mouth daily., Disp: 30 tablet, Rfl: 2   zolpidem (AMBIEN) 5 MG tablet, Take 5 mg by mouth at bedtime., Disp: , Rfl:

## 2022-11-05 ENCOUNTER — Encounter: Payer: Self-pay | Admitting: Pulmonary Disease

## 2022-11-05 ENCOUNTER — Ambulatory Visit (INDEPENDENT_AMBULATORY_CARE_PROVIDER_SITE_OTHER): Payer: Medicare HMO | Admitting: Pulmonary Disease

## 2022-11-05 VITALS — BP 124/76 | HR 61 | Ht 61.0 in | Wt 95.4 lb

## 2022-11-05 DIAGNOSIS — J432 Centrilobular emphysema: Secondary | ICD-10-CM

## 2022-11-05 DIAGNOSIS — J9612 Chronic respiratory failure with hypercapnia: Secondary | ICD-10-CM | POA: Diagnosis not present

## 2022-11-05 MED ORDER — PREDNISONE 10 MG PO TABS
10.0000 mg | ORAL_TABLET | Freq: Every day | ORAL | 2 refills | Status: DC
Start: 2022-11-05 — End: 2023-05-21

## 2022-11-05 NOTE — Patient Instructions (Signed)
We will schedule you for a bipap titration at the [facility]  Continue trelegy ellipta 1 puff daily  Use albuterol inhaler or nebulizer every 4-6 hours as needed  Follow up in 3 months

## 2022-11-05 NOTE — Progress Notes (Signed)
Synopsis: Referred in June 2024 for hospital follow up - COPD   Subjective:   PATIENT ID: [name] GENDER: female DOB: 1956-03-09, MRN: [id number]  HPI  Chief Complaint  Patient presents with   Hospitalization Follow-up    F/U for COPD. States she is still having issues with SOB. Did not receive prednisone after discharge.    [name] is a 67 year old woman, daily smoker with history of hypertension and COPD who is referred to pulmonary clinic for hospital follow up.   She was admitted 5/8 to 5/16 for COPD exacerbation and acute hypoxemic respiratory failure. She was weaned off supplemental oxygen prior to discharge. She is using oxygen at night with sleep. She is using trelegy ellipta 1 puff daily and as needed albuterol nebulizer treatments. She reports she did not get prednisone upon discharge from the hospital.   She complains of falling asleep easily and will be forgetful. ABG in hospital showed pH 7.33, pCO2 72, pO2 193. Her serumb bicarb ranges 29-37 indicating chronic hypercapnia.    Past Medical History:  Diagnosis Date   Arthritis    Asthma    Chest pain 2006   Chest pain 10/2016   Chronic back pain    Hypertension    Sciatica    Shortness of breath    due to  smoking      Family History  Problem Relation Age of Onset   Heart attack Mother 70   Hypertension Sister    Diabetes type II Sister    Tongue cancer Brother    Heart attack Sister 62   Lung cancer Sister    Hypertension Sister      Social History   Socioeconomic History   Marital status: Single    Spouse name: Not on file   Number of children: Not on file   Years of education: Not on file   Highest education level: Not on file  Occupational History   Occupation: Retired  Tobacco Use   Smoking status: Every Day    Packs/day: 0.10    Years: 30.00    Additional pack years: 0.00    Total pack years: 3.00    Types: Cigarettes   Smokeless tobacco: Never   Tobacco comments:     Smoking since teenage years  Vaping Use   Vaping Use: Never used  Substance and Sexual Activity   Alcohol use: No   Drug use: No   Sexual activity: Never  Other Topics Concern   Not on file  Social History Narrative   Great-grandmother   Used to work as a Corporate treasurer, now volunteers w/ free time at [employer]   Social Determinants of [employer] Strain: Low Risk  (10/05/2020)   Overall Financial Resource Strain (CARDIA)    Difficulty of Paying Living Expenses: Not hard at all  Food Insecurity: No Food Insecurity (10/03/2022)   Hunger Vital Sign    Worried About Running Out of Food in the Last Year: Never true    Ran Out of Food in the Last Year: Never true  Recent Concern: Food Insecurity - Food Insecurity Present (09/08/2022)   Hunger Vital Sign    Worried About Running Out of Food in the Last Year: Often true    Ran Out of Food in the Last Year: Sometimes true  Transportation Needs: No Transportation Needs (10/03/2022)   PRAPARE - Transportation    Lack of Transportation (Medical): No    Lack of  Transportation (Non-Medical): No  Physical Activity: Not on file  Stress: No Stress Concern Present (10/05/2020)   [employer] of Occupational Health - Occupational Stress Questionnaire    Feeling of Stress : Not at all  Social Connections: Moderately Integrated (10/05/2020)   Social Connection and Isolation Panel [NHANES]    Frequency of Communication with Friends and Family: More than three times a week    Frequency of Social Gatherings with Friends and Family: Three times a week    Attends Religious Services: More than 4 times per year    Active Member of Clubs or Organizations: Yes    Attends Banker Meetings: More than 4 times per year    Marital Status: Widowed  Intimate Partner Violence: Not At Risk (10/03/2022)   Humiliation, Afraid, Rape, and Kick questionnaire    Fear of Current or Ex-Partner: No    Emotionally Abused: No    Physically  Abused: No    Sexually Abused: No     No Known Allergies   Outpatient Medications Prior to Visit  Medication Sig Dispense Refill   albuterol (PROVENTIL) (2.5 MG/3ML) 0.083% nebulizer solution Take 3 mLs (2.5 mg total) by nebulization every 6 (six) hours as needed for wheezing or shortness of breath. 75 mL 12   albuterol (VENTOLIN HFA) 108 (90 Base) MCG/ACT inhaler Inhale 2 puffs into the lungs every 4 (four) hours as needed for wheezing or shortness of breath. 18 g 3   enalapril (VASOTEC) 10 MG tablet Take 1 tablet (10 mg total) by mouth daily. 30 tablet 2   Fluticasone-Umeclidin-Vilant (TRELEGY ELLIPTA) 200-62.5-25 MCG/ACT AEPB Inhale 1 puff into the lungs daily at 6 (six) AM. 28 each 2   LORazepam (ATIVAN) 1 MG tablet Take 1 tablet (1 mg total) by mouth every 6 (six) hours as needed for anxiety. 30 tablet 0   Multiple Vitamin (MULTIVITAMIN) capsule Take 1 capsule by mouth daily.     omeprazole (PRILOSEC) 40 MG capsule 1 cap by mouth in morning daily 1/2 hour before breakfast or other meds. (Patient taking differently: Take 40 mg by mouth daily as needed (for acid reflux).) 30 capsule 11   oxyCODONE-acetaminophen (PERCOCET) 10-325 MG tablet Take 1 tablet by mouth every 4 (four) hours as needed for pain.     zolpidem (AMBIEN) 5 MG tablet Take 5 mg by mouth at bedtime.     atorvastatin (LIPITOR) 10 MG tablet Take 1 tablet (10 mg total) by mouth at bedtime. 30 tablet 2   busPIRone (BUSPAR) 15 MG tablet Take 1 tablet (15 mg total) by mouth 2 (two) times daily. 60 tablet 2   sertraline (ZOLOFT) 100 MG tablet Take 1 tablet (100 mg total) by mouth daily. 30 tablet 2   No facility-administered medications prior to visit.    Review of Systems  Constitutional:  Positive for malaise/fatigue. Negative for chills, fever and weight loss.  HENT:  Negative for congestion, sinus pain and sore throat.   Eyes: Negative.   Respiratory:  Positive for cough and shortness of breath. Negative for hemoptysis,  sputum production and wheezing.   Cardiovascular:  Negative for chest pain, palpitations, orthopnea, claudication and leg swelling.  Gastrointestinal:  Negative for abdominal pain, heartburn, nausea and vomiting.  Genitourinary: Negative.   Musculoskeletal:  Negative for joint pain and myalgias.  Skin:  Negative for rash.  Neurological:  Negative for weakness.  Endo/Heme/Allergies: Negative.   Psychiatric/Behavioral: Negative.     Objective:   Vitals:   11/05/22 1115  BP:  124/76  Pulse: 61  SpO2: 97%  Weight: 95 lb 6.4 oz (43.3 kg)  Height: 5\' 1"  (1.549 m)     Physical Exam Constitutional:      General: She is not in acute distress.    Appearance: She is not ill-appearing.  HENT:     Head: Normocephalic and atraumatic.  Eyes:     General: No scleral icterus.    Conjunctiva/sclera: Conjunctivae normal.  Cardiovascular:     Rate and Rhythm: Normal rate and regular rhythm.     Pulses: Normal pulses.     Heart sounds: Normal heart sounds. No murmur heard. Pulmonary:     Effort: Pulmonary effort is normal.     Breath sounds: Normal breath sounds. Decreased air movement present. No wheezing, rhonchi or rales.  Musculoskeletal:     Right lower leg: No edema.     Left lower leg: No edema.  Lymphadenopathy:     Cervical: No cervical adenopathy.  Skin:    General: Skin is warm and dry.  Neurological:     General: No focal deficit present.     Mental Status: She is alert.  Psychiatric:        Mood and Affect: Mood normal.        Behavior: Behavior normal.        Thought Content: Thought content normal.        Judgment: Judgment normal.    CBC    Component Value Date/Time   WBC 7.8 10/09/2022 0531   RBC 4.14 10/09/2022 0531   HGB 12.2 10/09/2022 0531   HCT 39.4 10/09/2022 0531   PLT 287 10/09/2022 0531   MCV 95.2 10/09/2022 0531   MCH 29.5 10/09/2022 0531   MCHC 31.0 10/09/2022 0531   RDW 13.3 10/09/2022 0531   LYMPHSABS 2.7 06/02/2022 1938   MONOABS 0.5  06/02/2022 1938   EOSABS 0.1 06/02/2022 1938   BASOSABS 0.0 06/02/2022 1938      Latest Ref Rng & Units 10/10/2022    2:35 PM 10/09/2022    5:31 AM 10/07/2022    5:10 AM  BMP  Glucose 70 - 99 mg/dL 161  096  045   BUN 8 - 23 mg/dL 24  26  20    Creatinine 0.44 - 1.00 mg/dL 4.09  8.11  9.14   Sodium 135 - 145 mmol/L 137  140  138   Potassium 3.5 - 5.1 mmol/L 4.2  4.2  4.4   Chloride 98 - 111 mmol/L 93  95  97   CO2 22 - 32 mmol/L 36  37  32   Calcium 8.9 - 10.3 mg/dL 9.0  9.0  8.9    Chest imaging: CXR 10/06/22 Lungs are hyperexpanded. The lungs are clear without focal pneumonia, edema, pneumothorax or pleural effusion. The cardiopericardial silhouette is within normal limits for size. Bones are diffusely demineralized. Telemetry leads overlie the chest.  PFT:    Latest Ref Rng & Units 11/20/2016    8:41 AM  PFT Results  FVC-Pre L 2.04   FVC-Predicted Pre % 90   FVC-Post L 2.54   FVC-Predicted Post % 113   Pre FEV1/FVC % % 58   Post FEV1/FCV % % 55   FEV1-Pre L 1.18   FEV1-Predicted Pre % 67   FEV1-Post L 1.40   DLCO uncorrected ml/min/mmHg 9.28   DLCO UNC% % 45   DLVA Predicted % 53   TLC L 9.65   TLC % Predicted % 209   RV %  Predicted % 384     Labs:  Path:  Echo:  Heart Catheterization:       Assessment & Plan:   Centrilobular emphysema (HCC) - Plan: Basic Metabolic Panel (BMET), Bipap titration, predniSONE (DELTASONE) 10 MG tablet  Chronic hypercapnic respiratory failure (HCC) - Plan: Bipap titration, predniSONE (DELTASONE) 10 MG tablet  Discussion: [name] is a 67 year old woman, daily smoker with history of hypertension and COPD who is referred to pulmonary clinic for hospital follow up.   She has been doing well since discharge. She would like to be placed back on prednisone 10mg  daily as this is what she reports historically taking prior to hospitalization. She is to continue 10mg  dialy of prednisone and trelegy ellipta 1 puff daily for  her asthma-copd. Use albuterol nebulizer every 4-6 hours as needed.   We will schedule her for bipap titration at [facility] for chronic hypercapnic respiratory failure.   Follow up in 3 months.   Melody Comas, MD [facility] Office: [phone]     Current Outpatient Medications:    albuterol (PROVENTIL) (2.5 MG/3ML) 0.083% nebulizer solution, Take 3 mLs (2.5 mg total) by nebulization every 6 (six) hours as needed for wheezing or shortness of breath., Disp: 75 mL, Rfl: 12   albuterol (VENTOLIN HFA) 108 (90 Base) MCG/ACT inhaler, Inhale 2 puffs into the lungs every 4 (four) hours as needed for wheezing or shortness of breath., Disp: 18 g, Rfl: 3   enalapril (VASOTEC) 10 MG tablet, Take 1 tablet (10 mg total) by mouth daily., Disp: 30 tablet, Rfl: 2   Fluticasone-Umeclidin-Vilant (TRELEGY ELLIPTA) 200-62.5-25 MCG/ACT AEPB, Inhale 1 puff into the lungs daily at 6 (six) AM., Disp: 28 each, Rfl: 2   LORazepam (ATIVAN) 1 MG tablet, Take 1 tablet (1 mg total) by mouth every 6 (six) hours as needed for anxiety., Disp: 30 tablet, Rfl: 0   Multiple Vitamin (MULTIVITAMIN) capsule, Take 1 capsule by mouth daily., Disp: , Rfl:    omeprazole (PRILOSEC) 40 MG capsule, 1 cap by mouth in morning daily 1/2 hour before breakfast or other meds. (Patient taking differently: Take 40 mg by mouth daily as needed (for acid reflux).), Disp: 30 capsule, Rfl: 11   oxyCODONE-acetaminophen (PERCOCET) 10-325 MG tablet, Take 1 tablet by mouth every 4 (four) hours as needed for pain., Disp: , Rfl:    predniSONE (DELTASONE) 10 MG tablet, Take 1 tablet (10 mg total) by mouth daily with breakfast., Disp: 30 tablet, Rfl: 2   zolpidem (AMBIEN) 5 MG tablet, Take 5 mg by mouth at bedtime., Disp: , Rfl:

## 2022-11-09 ENCOUNTER — Encounter: Payer: Self-pay | Admitting: Pulmonary Disease

## 2022-11-13 ENCOUNTER — Encounter: Payer: Self-pay | Admitting: *Deleted

## 2022-11-13 NOTE — Progress Notes (Signed)
Pt attended 09/08/22 where her b/p ws 106/82 and her blood sugar was 119. At that event, pt did not identify a current PCP but did note food insecurity and was given resources. During her initial f/u call, pt agreed to receive a Get Care Now and community primary care clinic flyer to review to try to find a new PCP. The health equity team was unable to contact this pt today and chart review indicates that she was recently hospitalized, has seen a pulmonary specialist on 11/05/22. However there is no CHL visible documentation that she has been able to see or establish with a PCP. This caller reached out to system-wide Medicare care management resources  through [place], [place], to try to provide ongoing Medicare CM support pt to avoid medically avoidable ED visits and/or rehospitalizations. Letter sent to pt to notify her of attempts to contact her and to expect [facility] Medicare CM team outreach.

## 2022-11-14 ENCOUNTER — Telehealth: Payer: Self-pay

## 2022-11-14 NOTE — Progress Notes (Signed)
  Care Management   Outreach Note  11/14/2022 Name: [name] MRN: [id number] DOB: [birth year]  An unsuccessful telephone outreach was attempted today to contact the patient about Chronic Care Management needs.    Follow Up Plan:  A HIPAA compliant phone message was left for the patient providing contact information and requesting a return call.  The care management team will reach out to the patient again over the next 7 days.  If patient returns call to provider office, please advise to call Embedded Care Management Care Guide .  * at [phone] Lash, RMA Care Guide [facility]  [place], [place] [address] Direct Dial: [phone] [email or web address]

## 2022-11-23 LAB — ACID FAST CULTURE WITH REFLEXED SENSITIVITIES (MYCOBACTERIA): Acid Fast Culture: NEGATIVE

## 2022-12-13 ENCOUNTER — Ambulatory Visit (HOSPITAL_BASED_OUTPATIENT_CLINIC_OR_DEPARTMENT_OTHER): Payer: Medicare HMO | Attending: Pulmonary Disease | Admitting: Pulmonary Disease

## 2022-12-13 VITALS — Ht 60.0 in | Wt 98.0 lb

## 2022-12-13 DIAGNOSIS — J449 Chronic obstructive pulmonary disease, unspecified: Secondary | ICD-10-CM | POA: Diagnosis not present

## 2022-12-13 DIAGNOSIS — J432 Centrilobular emphysema: Secondary | ICD-10-CM | POA: Insufficient documentation

## 2022-12-13 DIAGNOSIS — J9612 Chronic respiratory failure with hypercapnia: Secondary | ICD-10-CM | POA: Diagnosis not present

## 2022-12-13 DIAGNOSIS — I493 Ventricular premature depolarization: Secondary | ICD-10-CM | POA: Diagnosis not present

## 2022-12-13 DIAGNOSIS — J439 Emphysema, unspecified: Secondary | ICD-10-CM | POA: Diagnosis present

## 2022-12-13 DIAGNOSIS — G4739 Other sleep apnea: Secondary | ICD-10-CM | POA: Insufficient documentation

## 2022-12-13 DIAGNOSIS — G4761 Periodic limb movement disorder: Secondary | ICD-10-CM | POA: Insufficient documentation

## 2022-12-13 DIAGNOSIS — J9610 Chronic respiratory failure, unspecified whether with hypoxia or hypercapnia: Secondary | ICD-10-CM | POA: Diagnosis present

## 2022-12-18 DIAGNOSIS — J432 Centrilobular emphysema: Secondary | ICD-10-CM | POA: Diagnosis not present

## 2022-12-18 NOTE — Procedures (Signed)
    Patient Name: [name], [name] Date: 12/13/2022 Gender: Female D.O.B: 11-16-1955 Age (years): 67 Referring Provider: Melody Comas Height (inches): 60 Interpreting Physician: Coralyn Helling MD, ABSM Weight (lbs): 98 RPSGT: Lowry Ram BMI: 19 MRN: [id number] Neck Size: 12.00  CLINICAL INFORMATION The patient is referred for a BiPAP titration to chronic respiratory failure in setting of COPD with emphysema.  SLEEP STUDY TECHNIQUE As per the AASM Manual for the Scoring of Sleep and Associated Events v2.3 (April 2016) with a hypopnea requiring 4% desaturations.  The channels recorded and monitored were frontal, central and occipital EEG, electrooculogram (EOG), submentalis EMG (chin), nasal and oral airflow, thoracic and abdominal wall motion, anterior tibialis EMG, snore microphone, electrocardiogram, and pulse oximetry. Bilevel positive airway pressure (BPAP) was initiated at the beginning of the study and titrated to treat sleep-disordered breathing.  MEDICATIONS Medications self-administered by patient taken the night of the study : Advil PM  RESPIRATORY PARAMETERS Optimal IPAP Pressure (cm): 8 AHI at Optimal Pressure (/hr) 0.4 Optimal EPAP Pressure (cm): 4   Overall Minimal O2 (%): 89.00 Minimal O2 at Optimal Pressure (%): 89.0  SLEEP ARCHITECTURE Start Time: 10:45:24 PM Stop Time: 5:55:06 AM Total Time (min): 429.7 Total Sleep Time (min): 168 Sleep Latency (min): 10.4 Sleep Efficiency (%): 39.1 REM Latency (min): 318.5 WASO (min): 251.3 Stage N1 (%): 24.40 Stage N2 (%): 53.57 Stage N3 (%): 5.65 Stage R (%): 16.4 Supine (%): 0.89 Arousal Index (/hr): 49.3   CARDIAC DATA The 2 lead EKG demonstrated sinus rhythm. The mean heart rate was 73.80 beats per minute. Other EKG findings include: PVCs.  LEG MOVEMENT DATA The total Periodic Limb Movements of Sleep (PLMS) were 49. The PLMS index was 17.50. A PLMS index of <15 is considered normal in  adults.  IMPRESSIONS - She did well with Bipap 8/4 cm H2O. - Supplemental oxygen was not used during this study. - No snoring was audible during this study. - 2-lead EKG demonstrated: PVCs - Mild periodic limb movements were observed during this study. Arousals associated with PLMs were significant.  DIAGNOSIS - COPD with Emphysema - Chronic Respiratory Failure - Periodic Limb Movement During Sleep  RECOMMENDATIONS - Trial of BiPAP therapy on 8/4 cm H2O with a Small size Resmed Full Face AirFit F10 mask and heated humidification. - Assess for the presence of restless leg syndrome. - Avoid alcohol, sedatives and other CNS depressants that may worsen sleep apnea and disrupt normal sleep architecture. - Sleep hygiene should be reviewed to assess factors that may improve sleep quality. - Weight management and regular exercise should be initiated or continued.  [Electronically signed] 12/18/2022 08:32 AM  Coralyn Helling MD, ABSM Diplomate, American Board of Sleep Medicine NPI: [id number]  [facility] PH: [phone]   FX: [phone] ACCREDITED BY THE AMERICAN ACADEMY OF SLEEP MEDICINE

## 2022-12-25 NOTE — Progress Notes (Signed)
  Care Management   Outreach Note  12/25/2022 Name: [name] MRN: [id number] DOB: [birth year]  Second unsuccessful telephone outreach was attempted today to contact the patient about Chronic Care Management needs.    Follow Up Plan:  A HIPAA compliant phone message was left for the patient providing contact information and requesting a return call.  The care management team will reach out to the patient again over the next 7 days.  If patient returns call to provider office, please advise to call Embedded Care Management Care Guide Penne Lash * at [phone] Lash, RMA Care Guide [facility]  [place], [place] [address] Direct Dial: [phone] [email or web address]

## 2022-12-27 ENCOUNTER — Encounter: Payer: Self-pay | Admitting: Cardiovascular Disease

## 2022-12-27 NOTE — Progress Notes (Signed)
  Cardiology Office Note:  .   Date:  12/28/2022  ID:  [name], DOB 06-17-1955, MRN [id number] PCP: Patient, No Pcp Per  [facility] Providers Cardiologist:       History of Present Illness: .   [name] is a 67 y.o. female with recent episodes of atypical CP  We were asked to see her for further evaluation of her CP   Was hospitalized with respiratory distress in May  Still smoking   Chronic dyspnea   Has been walking recenty  Has some chest pressure at night , not related to exertion  Has DOE   She is a poor historian      ROS:   Studies Reviewed: Marland Kitchen        EKG Interpretation Date/Time:  Friday December 28 2022 10:00:27 EDT Ventricular Rate:  64 PR Interval:  160 QRS Duration:  76 QT Interval:  400 QTC Calculation: 412 R Axis:   83  Text Interpretation: Sinus rhythm with occasional Premature ventricular complexes Septal infarct (cited on or before 06-Oct-2022) When compared with ECG of 10-Oct-2022 17:55, Premature ventricular complexes are now Present Confirmed by Kristeen Miss ([id number]) on 12/28/2022 10:29:44 AM    Risk Assessment/Calculations:             Physical Exam:   VS:  BP 128/80   Pulse 64   Ht 5\' 1"  (1.549 m)   Wt 100 lb (45.4 kg)   BMI 18.89 kg/m    Wt Readings from Last 3 Encounters:  12/28/22 100 lb (45.4 kg)  12/13/22 98 lb (44.5 kg)  11/05/22 95 lb 6.4 oz (43.3 kg)    GEN: thin, chronically ill appearing   in no acute distress NECK: No JVD; No carotid bruits CARDIAC: RRR, no murmurs, rubs, gallops RESPIRATORY:  Clear to auscultation without rales, wheezing or rhonchi  ABDOMEN: Soft, non-tender, non-distended EXTREMITIES:  No edema; No deformity   ASSESSMENT AND PLAN: .     Chronic shortness of breath: I suspect this is due to her COPD.  Will get an echocardiogram for further evaluation.  She does not exercise.  2.  Chest pressure/tightness: She has a long history of cigarette smoking.  Her chest tightness might  be due to COPD but I cannot rule out coronary artery disease. ECG shows poor R wave progression    Will get a coronary CT angiogram for further evaluation.  Will have her follow-up with Tereso Newcomer, PA in 3 months.        Dispo: 3 months with Tereso Newcomer, PA   Signed, Kristeen Miss, MD

## 2022-12-28 ENCOUNTER — Ambulatory Visit: Payer: Medicare HMO | Attending: Cardiovascular Disease | Admitting: Cardiovascular Disease

## 2022-12-28 ENCOUNTER — Encounter: Payer: Self-pay | Admitting: Cardiovascular Disease

## 2022-12-28 VITALS — BP 128/80 | HR 64 | Ht 61.0 in | Wt 100.0 lb

## 2022-12-28 DIAGNOSIS — Z136 Encounter for screening for cardiovascular disorders: Secondary | ICD-10-CM | POA: Diagnosis not present

## 2022-12-28 DIAGNOSIS — R0789 Other chest pain: Secondary | ICD-10-CM | POA: Diagnosis not present

## 2022-12-28 DIAGNOSIS — R0609 Other forms of dyspnea: Secondary | ICD-10-CM

## 2022-12-28 MED ORDER — METOPROLOL TARTRATE 50 MG PO TABS: ORAL_TABLET | ORAL | 0 refills | Status: DC

## 2022-12-28 NOTE — Patient Instructions (Addendum)
Medication Instructions:  Your physician recommends that you continue on your current medications as directed. Please refer to the Current Medication list given to you today.  *If you need a refill on your cardiac medications before your next appointment, please call your pharmacy*   Lab Work: BMET today If you have labs (blood work) drawn today and your tests are completely normal, you will receive your results only by: MyChart Message (if you have MyChart) OR A paper copy in the mail If you have any lab test that is abnormal or we need to change your treatment, we will call you to review the results.  Testing/Procedures: ECHO Your physician has requested that you have an echocardiogram. Echocardiography is a painless test that uses sound waves to create images of your heart. It provides your doctor with information about the size and shape of your heart and how well your heart's chambers and valves are working. This procedure takes approximately one hour. There are no restrictions for this procedure. Please do NOT wear cologne, perfume, aftershave, or lotions (deodorant is allowed). Please arrive 15 minutes prior to your appointment time.  Coronary CT Angiogram Your physician has requested that you have cardiac CT. Cardiac computed tomography (CT) is a painless test that uses an x-ray machine to take clear, detailed pictures of your heart. For further information please visit [email or web address] Please follow instruction sheet as given.  Follow-Up: At [facility], you and your health needs are our priority.  As part of our continuing mission to provide you with exceptional heart care, we have created designated Provider Care Teams.  These Care Teams include your primary Cardiologist (physician) and Advanced Practice Providers (APPs -  Physician Assistants and Nurse Practitioners) who all work together to provide you with the care you need, when you need it.  We recommend signing  up for the patient portal called "MyChart".  Sign up information is provided on this After Visit Summary.  MyChart is used to connect with patients for Virtual Visits (Telemedicine).  Patients are able to view lab/test results, encounter notes, upcoming appointments, etc.  Non-urgent messages can be sent to your provider as well.   To learn more about what you can do with MyChart, go to [email or web address].    Your next appointment:   3 month(s)  Provider:   Tereso Newcomer, PA     Other Instructions   Your cardiac CT will be scheduled at:   [facility] [address] [place], [place] [address] [phone]  please arrive at the [facility] Entrance (Entrance C2) of [facility] 30 minutes prior to test start time. You can use the FREE valet parking offered at entrance C (encouraged to control the heart rate for the test)  Proceed to the [facility] Radiology Department (first floor) to check-in and test prep.  All radiology patients and guests should use entrance C2 at [facility], accessed from [facility], even though the hospital's physical address listed is [address].     Please follow these instructions carefully (unless otherwise directed):  An IV will be required for this test and Nitroglycerin will be given.  Hold all erectile dysfunction medications at least 3 days (72 hrs) prior to test. (Ie viagra, cialis, sildenafil, tadalafil, etc)   On the Night Before the Test: Be sure to Drink plenty of water. Do not consume any caffeinated/decaffeinated beverages or chocolate 12 hours prior to your test. Do not take any antihistamines  12 hours prior to your test.  On the Day of the Test: Drink plenty of water until 1 hour prior to the test. Do not eat any food 1 hour prior to test. You may take your regular medications prior to the test.  Take metoprolol (Lopressor) two hours prior to test. FEMALES- please  wear underwire-free bra if available, avoid dresses & tight clothing      After the Test: Drink plenty of water. After receiving IV contrast, you may experience a mild flushed feeling. This is normal. On occasion, you may experience a mild rash up to 24 hours after the test. This is not dangerous. If this occurs, you can take Benadryl 25 mg and increase your fluid intake. If you experience trouble breathing, this can be serious. If it is severe call 911 IMMEDIATELY. If it is mild, please call our office. If you take any of these medications: Glipizide/Metformin, Avandament, Glucavance, please do not take 48 hours after completing test unless otherwise instructed.  We will call to schedule your test 2-4 weeks out understanding that some insurance companies will need an authorization prior to the service being performed.   For more information and frequently asked questions, please visit our website : [email or web address]  For non-scheduling related questions, please contact the cardiac imaging nurse navigator should you have any questions/concerns: Cardiac Imaging Nurse Navigators Direct Office Dial: [phone]   For scheduling needs, including cancellations and rescheduling, please call [place], [phone].

## 2023-01-01 NOTE — Progress Notes (Signed)
  Care Management   Outreach Note  01/01/2023 Name: [name] MRN: [id number] DOB: 01/16/1956  Third unsuccessful telephone outreach was attempted today to contact the patient about Care Management needs.The patient was referred to the case management team by the provider who initiated CCM services. The provider has been notified of our unsuccessful attempts to make or maintain contact with the patient.   Follow Up Plan:  We have been unable to make contact with the patient for follow up. The care management team is available to follow up with the patient after provider conversation with the patient regarding recommendation for care management engagement and subsequent re-referral to the care management team.   Penne Lash, RMA Care Guide [facility]  [place], [place] [address] Direct Dial: [phone] [email or web address]

## 2023-01-02 ENCOUNTER — Telehealth (HOSPITAL_COMMUNITY): Payer: Self-pay | Admitting: *Deleted

## 2023-01-02 NOTE — Telephone Encounter (Signed)
Attempted to call patient regarding upcoming cardiac CT appointment. Left message on voicemail with name and callback number Hayley Sharpe RN Navigator Cardiac Imaging [facility] [phone] Office   

## 2023-01-03 ENCOUNTER — Ambulatory Visit (HOSPITAL_COMMUNITY): Payer: Medicare HMO

## 2023-01-04 ENCOUNTER — Ambulatory Visit (HOSPITAL_COMMUNITY): Payer: Medicare HMO

## 2023-01-23 ENCOUNTER — Telehealth (HOSPITAL_COMMUNITY): Payer: Self-pay | Admitting: *Deleted

## 2023-01-23 NOTE — Telephone Encounter (Signed)
Attempted to call patient regarding upcoming cardiac CT appointment. °Left message on voicemail with name and callback number ° °Merle Prescott RN Navigator Cardiac Imaging °[facility] °[phone] Office °[phone] Cell ° °

## 2023-01-24 ENCOUNTER — Ambulatory Visit (HOSPITAL_COMMUNITY): Admission: RE | Admit: 2023-01-24 | Payer: Medicare HMO | Source: Ambulatory Visit

## 2023-01-24 ENCOUNTER — Ambulatory Visit (HOSPITAL_COMMUNITY): Payer: Medicare HMO | Attending: Cardiology

## 2023-01-24 ENCOUNTER — Telehealth (HOSPITAL_COMMUNITY): Payer: Self-pay | Admitting: Emergency Medicine

## 2023-01-24 DIAGNOSIS — I5030 Unspecified diastolic (congestive) heart failure: Secondary | ICD-10-CM | POA: Diagnosis not present

## 2023-01-24 DIAGNOSIS — R0789 Other chest pain: Secondary | ICD-10-CM | POA: Diagnosis not present

## 2023-01-24 DIAGNOSIS — Z136 Encounter for screening for cardiovascular disorders: Secondary | ICD-10-CM | POA: Diagnosis not present

## 2023-01-24 DIAGNOSIS — R0609 Other forms of dyspnea: Secondary | ICD-10-CM | POA: Diagnosis not present

## 2023-01-24 DIAGNOSIS — I088 Other rheumatic multiple valve diseases: Secondary | ICD-10-CM

## 2023-01-24 LAB — ECHOCARDIOGRAM COMPLETE
Area-P 1/2: 3.56 cm2
S' Lateral: 2.6 cm

## 2023-01-25 ENCOUNTER — Ambulatory Visit (HOSPITAL_COMMUNITY)
Admission: RE | Admit: 2023-01-25 | Discharge: 2023-01-25 | Disposition: A | Discharge status: Home / Self Care | Payer: Medicare HMO | Source: Ambulatory Visit | Attending: Cardiovascular Disease | Admitting: Cardiovascular Disease

## 2023-01-25 ENCOUNTER — Encounter (HOSPITAL_COMMUNITY): Payer: Self-pay

## 2023-01-25 DIAGNOSIS — Z136 Encounter for screening for cardiovascular disorders: Secondary | ICD-10-CM

## 2023-01-25 DIAGNOSIS — R0609 Other forms of dyspnea: Secondary | ICD-10-CM

## 2023-01-25 DIAGNOSIS — R0789 Other chest pain: Secondary | ICD-10-CM

## 2023-01-25 MED ORDER — NITROGLYCERIN 0.4 MG SL SUBL: 0.8000 mg | SUBLINGUAL_TABLET | Freq: Once | SUBLINGUAL | Status: DC

## 2023-01-25 MED ORDER — NITROGLYCERIN 0.4 MG SL SUBL
SUBLINGUAL_TABLET | SUBLINGUAL | Status: AC
  Filled 2023-01-25: qty 2

## 2023-01-25 NOTE — Telephone Encounter (Signed)
Reaching out to patient to offer assistance regarding upcoming cardiac imaging study; pt verbalizes understanding of appt date/time, parking situation and where to check in, pre-test NPO status and medications ordered, and verified current allergies; name and call back number provided for further questions should they arise Sara Wallace RN Navigator Cardiac Imaging [facility] [phone] office [phone] cell 

## 2023-02-15 ENCOUNTER — Other Ambulatory Visit (HOSPITAL_COMMUNITY): Payer: Self-pay | Admitting: Emergency Medicine

## 2023-02-15 MED ORDER — METOPROLOL TARTRATE 50 MG PO TABS: ORAL_TABLET | ORAL | 0 refills | Status: DC

## 2023-02-20 ENCOUNTER — Telehealth (HOSPITAL_COMMUNITY): Payer: Self-pay | Admitting: *Deleted

## 2023-02-20 NOTE — Telephone Encounter (Signed)
Attempted to call patient regarding upcoming cardiac CT appointment. °Left message on voicemail with name and callback number ° °Edie Vallandingham RN Navigator Cardiac Imaging °[facility] °[phone] Office °[phone] Cell ° °

## 2023-02-21 ENCOUNTER — Telehealth (HOSPITAL_COMMUNITY): Payer: Self-pay | Admitting: *Deleted

## 2023-02-21 NOTE — Telephone Encounter (Addendum)
Reaching out to patient to offer assistance regarding upcoming cardiac imaging study; pt verbalizes understanding of appt date/time, parking situation and where to check in, pre-test NPO status and medications ordered, and verified current allergies; name and call back number provided for further questions should they arise  Larey Brick RN Navigator Cardiac Imaging [facility] [phone] office [phone] cell  Patient to take 50mg  metoprolol tartrate two hours prior to her cardiac CT scan. She is aware to arrive at 2:45 PM.

## 2023-02-22 ENCOUNTER — Ambulatory Visit (HOSPITAL_COMMUNITY): Admission: RE | Admit: 2023-02-22 | Payer: Medicare HMO | Source: Ambulatory Visit

## 2023-03-13 ENCOUNTER — Encounter: Payer: Self-pay | Admitting: *Deleted

## 2023-03-13 NOTE — Progress Notes (Signed)
Pt attended 09/08/22 screening event where her b/p was 106/82 and her blood sugar was 119. At the event, the pt identified a food insecurity for which she was given resources and did not identify a PCP. During the initial event f/u, pt confirmed she did not have a regular primary care dr any more and was sent PCP resources to review. During the 60 day f/u, health equity team member was unable to reach pt by phone, referred pt to Medicare managed care but that CM team was also unable to contact pt, who had been hospitalized. During the 6 month post event f/u call today, pt shared she is being followed by [facility] cardiology and pulmonology doctors and that she had established care with Diamantina Monks, NP at [facility], [address] location, and that she was pleased with her care. Pt also noted she did not have any current SDOH needs. No additional health equity team support indicated or scheduled at this time.

## 2023-03-29 ENCOUNTER — Other Ambulatory Visit: Payer: Self-pay

## 2023-03-29 ENCOUNTER — Emergency Department (HOSPITAL_COMMUNITY)
Admission: EM | Admit: 2023-03-29 | Discharge: 2023-03-29 | Disposition: A | Discharge status: Home / Self Care | Payer: Medicare HMO | Attending: Emergency Medicine | Admitting: Emergency Medicine

## 2023-03-29 ENCOUNTER — Emergency Department (HOSPITAL_COMMUNITY): Payer: Medicare HMO

## 2023-03-29 DIAGNOSIS — E876 Hypokalemia: Secondary | ICD-10-CM | POA: Diagnosis not present

## 2023-03-29 DIAGNOSIS — Z79899 Other long term (current) drug therapy: Secondary | ICD-10-CM | POA: Insufficient documentation

## 2023-03-29 DIAGNOSIS — J441 Chronic obstructive pulmonary disease with (acute) exacerbation: Secondary | ICD-10-CM | POA: Insufficient documentation

## 2023-03-29 DIAGNOSIS — Z1152 Encounter for screening for COVID-19: Secondary | ICD-10-CM | POA: Insufficient documentation

## 2023-03-29 DIAGNOSIS — Z720 Tobacco use: Secondary | ICD-10-CM

## 2023-03-29 DIAGNOSIS — Z812 Family history of tobacco abuse and dependence: Secondary | ICD-10-CM | POA: Insufficient documentation

## 2023-03-29 DIAGNOSIS — R0602 Shortness of breath: Secondary | ICD-10-CM | POA: Diagnosis present

## 2023-03-29 DIAGNOSIS — I10 Essential (primary) hypertension: Secondary | ICD-10-CM | POA: Diagnosis not present

## 2023-03-29 LAB — BASIC METABOLIC PANEL
Anion gap: 12 (ref 5–15)
BUN: 12 mg/dL (ref 8–23)
CO2: 32 mmol/L (ref 22–32)
Calcium: 9.4 mg/dL (ref 8.9–10.3)
Chloride: 90 mmol/L — ABNORMAL LOW (ref 98–111)
Creatinine, Ser: 0.65 mg/dL (ref 0.44–1.00)
GFR, Estimated: >60 mL/min (ref >60)
Glucose, Bld: 155 mg/dL — ABNORMAL HIGH (ref 70–99)
Potassium: 2.3 mmol/L — CL (ref 3.5–5.1)
Sodium: 134 mmol/L — ABNORMAL LOW (ref 135–145)

## 2023-03-29 LAB — CBC WITH DIFFERENTIAL/PLATELET
Abs Immature Granulocytes: 0.04 10*3/uL (ref 0.00–0.07)
Basophils Absolute: 0 10*3/uL (ref 0.0–0.1)
Basophils Relative: 0 %
Eosinophils Absolute: 0.1 10*3/uL (ref 0.0–0.5)
Eosinophils Relative: 1 %
HCT: 34.3 % — ABNORMAL LOW (ref 36.0–46.0)
Hemoglobin: 10.7 g/dL — ABNORMAL LOW (ref 12.0–15.0)
Immature Granulocytes: 0 %
Lymphocytes Relative: 33 %
Lymphs Abs: 3 10*3/uL (ref 0.7–4.0)
MCH: 29.5 pg (ref 26.0–34.0)
MCHC: 31.2 g/dL (ref 30.0–36.0)
MCV: 94.5 fL (ref 80.0–100.0)
Monocytes Absolute: 0.4 10*3/uL (ref 0.1–1.0)
Monocytes Relative: 5 %
Neutro Abs: 5.6 10*3/uL (ref 1.7–7.7)
Neutrophils Relative %: 61 %
Platelets: 239 10*3/uL (ref 150–400)
RBC: 3.63 MIL/uL — ABNORMAL LOW (ref 3.87–5.11)
RDW: 13.7 % (ref 11.5–15.5)
WBC: 9.2 10*3/uL (ref 4.0–10.5)
nRBC: 0 % (ref 0.0–0.2)

## 2023-03-29 LAB — RESP PANEL BY RT-PCR (RSV, FLU A&B, COVID)  RVPGX2
Influenza A by PCR: NEGATIVE
Influenza B by PCR: NEGATIVE
Resp Syncytial Virus by PCR: NEGATIVE
SARS Coronavirus 2 by RT PCR: NEGATIVE

## 2023-03-29 LAB — BRAIN NATRIURETIC PEPTIDE: B Natriuretic Peptide: 22 pg/mL (ref 0.0–100.0)

## 2023-03-29 MED ORDER — KETOROLAC TROMETHAMINE 30 MG/ML IJ SOLN
30.0000 mg | Freq: Once | INTRAMUSCULAR | Status: AC
  Administered 2023-03-29: 30 mg via INTRAVENOUS
  Filled 2023-03-29: qty 1

## 2023-03-29 MED ORDER — PREDNISONE 50 MG PO TABS: 50.0000 mg | ORAL_TABLET | Freq: Every day | ORAL | 0 refills | Status: DC

## 2023-03-29 MED ORDER — SODIUM CHLORIDE 0.9 % IV BOLUS
1000.0000 mL | Freq: Once | INTRAVENOUS | Status: AC
  Administered 2023-03-29: 1000 mL via INTRAVENOUS

## 2023-03-29 MED ORDER — POTASSIUM CHLORIDE CRYS ER 20 MEQ PO TBCR: 20.0000 meq | EXTENDED_RELEASE_TABLET | Freq: Every day | ORAL | 0 refills | Status: DC

## 2023-03-29 MED ORDER — POTASSIUM CHLORIDE CRYS ER 20 MEQ PO TBCR: 40.0000 meq | EXTENDED_RELEASE_TABLET | Freq: Once | ORAL | Status: DC

## 2023-03-29 NOTE — ED Provider Notes (Signed)
[name] EMERGENCY DEPARTMENT AT [facility] Provider Note   CSN: [id number] Arrival date & time: 03/29/23  1800     History  Chief Complaint  Patient presents with   Shortness of Breath    [name] is a 67 y.o. female.  Pt is a 67 yo female with pmhx significant for COPD, HTN, chronic pain, and tobacco abuse.  Pt said she has been having sob all week.  It was not getting better, so she called EMS today.  EMS said she was not moving much air initially, so she was given 125 mg solumedrol, 2 g mag, and 15 mg albuterol en route.  Sob has improved, but she still feels sob.       Home Medications Prior to Admission medications   Medication Sig Start Date End Date Taking? Authorizing Provider  potassium chloride SA (KLOR-CON M) 20 MEQ tablet Take 1 tablet (20 mEq total) by mouth daily. 03/29/23  Yes [name], MD  predniSONE (DELTASONE) 50 MG tablet Take 1 tablet (50 mg total) by mouth daily with breakfast. 03/29/23  Yes [name], MD  albuterol (PROVENTIL) (2.5 MG/3ML) 0.083% nebulizer solution Take 3 mLs (2.5 mg total) by nebulization every 6 (six) hours as needed for wheezing or shortness of breath. 06/10/21   [name], PA-C  albuterol (VENTOLIN HFA) 108 (90 Base) MCG/ACT inhaler Inhale 2 puffs into the lungs every 4 (four) hours as needed for wheezing or shortness of breath. 10/11/22   [name], [name], DO  enalapril (VASOTEC) 10 MG tablet Take 1 tablet (10 mg total) by mouth daily. 10/11/22 01/09/23  [name], [name], DO  metoprolol tartrate (LOPRESSOR) 50 MG tablet Take 1 tablet by mouth two hours prior to scan 02/15/23   Nahser, Deloris Ping, MD  Multiple Vitamin (MULTIVITAMIN) capsule Take 1 capsule by mouth daily.    [provider]  oxyCODONE-acetaminophen (PERCOCET) 10-325 MG tablet Take 1 tablet by mouth every 4 (four) hours as needed for pain. 12/23/20   [provider]  predniSONE (DELTASONE) 10 MG tablet Take 1 tablet (10 mg  total) by mouth daily with breakfast. 11/05/22   Martina Sinner, MD  zolpidem (AMBIEN) 5 MG tablet Take 5 mg by mouth at bedtime. 12/25/20   [provider]      Allergies    Patient has no known allergies.    Review of Systems   Review of Systems  Respiratory:  Positive for cough, shortness of breath and wheezing.   All other systems reviewed and are negative.   Physical Exam Updated Vital Signs BP 100/64   Pulse 76   Temp 98.1 F (36.7 C)   Resp 19   Ht 5\' 1"  (1.549 m)   Wt 44.5 kg   SpO2 90%   BMI 18.52 kg/m  Physical Exam Vitals and nursing note reviewed.  Constitutional:      General: She is in acute distress.     Appearance: She is well-developed.  HENT:     Head: Normocephalic and atraumatic.     Mouth/Throat:     Mouth: Mucous membranes are moist.     Pharynx: Oropharynx is clear.  Eyes:     Extraocular Movements: Extraocular movements intact.     Pupils: Pupils are equal, round, and reactive to light.  Cardiovascular:     Rate and Rhythm: Normal rate and regular rhythm.  Pulmonary:     Effort: Tachypnea present.     Breath sounds: Decreased breath  sounds and wheezing present.  Abdominal:     General: Bowel sounds are normal.     Palpations: Abdomen is soft.  Musculoskeletal:        General: Normal range of motion.     Cervical back: Normal range of motion and neck supple.  Skin:    General: Skin is warm.     Capillary Refill: Capillary refill takes less than 2 seconds.  Neurological:     General: No focal deficit present.     Mental Status: She is alert and oriented to person, place, and time.  Psychiatric:        Mood and Affect: Mood normal.        Behavior: Behavior normal.     ED Results / Procedures / Treatments   Labs (all labs ordered are listed, but only abnormal results are displayed) Labs Reviewed  BASIC METABOLIC PANEL - Abnormal; Notable for the following components:      Result Value   Sodium 134 (*)    Potassium 2.3  (*)    Chloride 90 (*)    Glucose, Bld 155 (*)    All other components within normal limits  CBC WITH DIFFERENTIAL/PLATELET - Abnormal; Notable for the following components:   RBC 3.63 (*)    Hemoglobin 10.7 (*)    HCT 34.3 (*)    All other components within normal limits  RESP PANEL BY RT-PCR (RSV, FLU A&B, COVID)  RVPGX2  BRAIN NATRIURETIC PEPTIDE  MAGNESIUM    EKG EKG Interpretation Date/Time:  Friday March 29 2023 18:23:37 EDT Ventricular Rate:  77 PR Interval:  158 QRS Duration:  102 QT Interval:  393 QTC Calculation: 433 R Axis:   90  Text Interpretation: Sinus rhythm Atrial premature complexes Borderline right axis deviation Probable anteroseptal infarct, old No significant change since last tracing Confirmed by [name] [phone]) on 03/29/2023 6:25:35 PM  Radiology DG Chest Port 1 View  Result Date: 03/29/2023 CLINICAL DATA:  Shortness of breath EXAM: PORTABLE CHEST 1 VIEW COMPARISON:  Chest x-ray dated Oct 06, 2022 FINDINGS: Cardiac and mediastinal contours are unchanged. Lungs are clear. No evidence of pleural effusion or pneumothorax. IMPRESSION: No active disease. Electronically Signed   By: Allegra Lai M.D.   On: 03/29/2023 20:02    Procedures Procedures    Medications Ordered in ED Medications  potassium chloride SA (KLOR-CON M) CR tablet 40 mEq (has no administration in time range)  sodium chloride 0.9 % bolus 1,000 mL (1,000 mLs Intravenous New Bag/Given 03/29/23 1902)  ketorolac (TORADOL) 30 MG/ML injection 30 mg (30 mg Intravenous Given 03/29/23 1901)    ED Course/ Medical Decision Making/ A&P                                 Medical Decision Making Amount and/or Complexity of Data Reviewed Labs: ordered. Radiology: ordered.  Risk Prescription drug management.   This patient presents to the ED for concern of sob, this involves an extensive number of treatment options, and is a complaint that carries with it a high risk of complications  and morbidity.  The differential diagnosis includes copd exac, covid/flu, pna   Co morbidities that complicate the patient evaluation  COPD, HTN, chronic pain, and tobacco abuse   Additional history obtained:  Additional history obtained from epic chart review External records from outside source obtained and reviewed including EMS report   Lab Tests:  I Ordered, and  personally interpreted labs.  The pertinent results include:  cbc with hgb low at 10.7 (12.2 in May, but usually runs in the low 11s); bmp with k low at 2.3, covid/flu neg, bnp 22   Imaging Studies ordered:  I ordered imaging studies including cxr  I independently visualized and interpreted imaging which showed No active disease.  I agree with the radiologist interpretation   Cardiac Monitoring:  The patient was maintained on a cardiac monitor.  I personally viewed and interpreted the cardiac monitored which showed an underlying rhythm of: nsr   Medicines ordered and prescription drug management:  I ordered medication including ivfs/toradol/nebs  for sx  Reevaluation of the patient after these medicines showed that the patient improved I have reviewed the patients home medicines and have made adjustments as needed  Problem List / ED Course:  COPD exac:  pt feels back to normal and is requesting to go home.  She is able to ambulate with O2 sats staying in the upper 90s.  She knows to stop smoking.  She is to return if worse.  F/u with pcp. Hypokalemia:  kdur given   Reevaluation:  After the interventions noted above, I reevaluated the patient and found that they have :improved   Social Determinants of Health:  Lives at home   Dispostion:  After consideration of the diagnostic results and the patients response to treatment, I feel that the patent would benefit from discharge with outpatient f/u.          Final Clinical Impression(s) / ED Diagnoses Final diagnoses:  COPD exacerbation (HCC)   Hypokalemia  Tobacco abuse    Rx / DC Orders ED Discharge Orders          Ordered    predniSONE (DELTASONE) 50 MG tablet  Daily with breakfast        03/29/23 2059    potassium chloride SA (KLOR-CON M) 20 MEQ tablet  Daily        03/29/23 2059              [name], MD 03/29/23 2101

## 2023-03-29 NOTE — ED Triage Notes (Signed)
Patient brought in by EMS from home with c/o SOB x2 dayes. EMS started 20g in L AC and gave 125 of Solu-medrol, 15mg  of albuterol, 1 of Atrovent, a neb treatment and 2 Mag drips. She has a HX of COPD and c/o chest tightness. 86/60 100%

## 2023-03-29 NOTE — Discharge Instructions (Signed)
Try to stop smoking. °

## 2023-03-31 DIAGNOSIS — R072 Precordial pain: Secondary | ICD-10-CM | POA: Insufficient documentation

## 2023-03-31 NOTE — Progress Notes (Deleted)
   Cardiology Office Note:    Date:  03/31/2023  ID:  [name], DOB 12/12/1955, MRN [id number] PCP: Karenann Cai, NP  [facility] Providers Cardiologist:  None { Click to update primary MD,subspecialty MD or APP then REFRESH:1}    {Click to Open Review  :1}   Patient Profile:      Chronic Obstructive Pulmonary Disease Hypertension  TTE 01/24/23: EF 60-65, no RWMA, Gr 1 DD, GLS -20.5, mildly reduced RVSF, mod pulm HTN, RVSP 49.8,  mild LAE, mod RAE, small effusion, trivial MR, mild AI, RAP 3        {      :1}   History of Present Illness:  Discussed the use of AI scribe software for clinical note transcription with the patient, who gave verbal consent to proceed.  [name] is a 67 y.o. female who returns for follow up of chest pain and shortness of breath. She was evaluated by Dr. Elease Hashimoto in 12/2022 for chest pain and shortness of breath. She had an echocardiogram done that showed normal EF and mod pulmonary hypertension. She is to be referred to pulmonology. CCTA was also ordered by never done. She was recently seen in the ED 03/29/23 for AECOPD. Her K+ was low and this was replaced.         ROS   See HPI ***    Studies Reviewed:       *** Results          Risk Assessment/Calculations:   {Does this patient have ATRIAL FIBRILLATION?:[phone]} No BP recorded.  {Refresh Note OR Click here to enter BP  :1}***       Physical Exam:   VS:  There were no vitals taken for this visit.   Wt Readings from Last 3 Encounters:  03/29/23 98 lb (44.5 kg)  12/28/22 100 lb (45.4 kg)  12/13/22 98 lb (44.5 kg)    Physical Exam***     Assessment and Plan:   Assessment & Plan Precordial chest pain   Assessment and Plan             {      :1}    {Are you ordering a CV Procedure (e.g. stress test, cath, DCCV, TEE, etc)?   Press F2        :[id number]}  Dispo:  No follow-ups on file.  Signed, Tereso Newcomer, PA-C

## 2023-04-01 ENCOUNTER — Ambulatory Visit: Payer: Medicare HMO | Admitting: Physician Assistant

## 2023-04-01 ENCOUNTER — Ambulatory Visit: Payer: Medicare HMO | Admitting: Nurse Practitioner

## 2023-04-01 DIAGNOSIS — R072 Precordial pain: Secondary | ICD-10-CM

## 2023-04-04 ENCOUNTER — Telehealth (HOSPITAL_COMMUNITY): Payer: Self-pay | Admitting: *Deleted

## 2023-04-04 NOTE — Telephone Encounter (Signed)
Attempted to call patient regarding upcoming cardiac CT appointment. Left message on voicemail with name and callback number Hayley Sharpe RN Navigator Cardiac Imaging [facility] [phone] Office   

## 2023-04-05 ENCOUNTER — Ambulatory Visit (HOSPITAL_COMMUNITY)
Admission: RE | Admit: 2023-04-05 | Discharge: 2023-04-05 | Disposition: A | Discharge status: Home / Self Care | Payer: Medicare HMO | Source: Ambulatory Visit | Attending: Cardiovascular Disease | Admitting: Cardiovascular Disease

## 2023-04-05 DIAGNOSIS — I2510 Atherosclerotic heart disease of native coronary artery without angina pectoris: Secondary | ICD-10-CM | POA: Diagnosis not present

## 2023-04-05 DIAGNOSIS — I517 Cardiomegaly: Secondary | ICD-10-CM | POA: Diagnosis not present

## 2023-04-05 DIAGNOSIS — R0609 Other forms of dyspnea: Secondary | ICD-10-CM | POA: Insufficient documentation

## 2023-04-05 DIAGNOSIS — R0789 Other chest pain: Secondary | ICD-10-CM | POA: Diagnosis present

## 2023-04-05 DIAGNOSIS — R072 Precordial pain: Secondary | ICD-10-CM

## 2023-04-05 DIAGNOSIS — Z136 Encounter for screening for cardiovascular disorders: Secondary | ICD-10-CM | POA: Insufficient documentation

## 2023-04-05 MED ORDER — METOPROLOL TARTRATE 5 MG/5ML IV SOLN
INTRAVENOUS | Status: AC
  Filled 2023-04-05: qty 5

## 2023-04-05 MED ORDER — IOHEXOL 350 MG/ML SOLN
100.0000 mL | Freq: Once | INTRAVENOUS | Status: AC | PRN
  Administered 2023-04-05: 100 mL via INTRAVENOUS

## 2023-04-05 MED ORDER — NITROGLYCERIN 0.4 MG SL SUBL
SUBLINGUAL_TABLET | SUBLINGUAL | Status: AC
  Filled 2023-04-05: qty 2

## 2023-04-05 MED ORDER — NITROGLYCERIN 0.4 MG SL SUBL
0.8000 mg | SUBLINGUAL_TABLET | Freq: Once | SUBLINGUAL | Status: AC
  Administered 2023-04-05: 0.8 mg via SUBLINGUAL

## 2023-04-11 ENCOUNTER — Telehealth: Payer: Self-pay

## 2023-04-11 DIAGNOSIS — Z79899 Other long term (current) drug therapy: Secondary | ICD-10-CM

## 2023-04-11 DIAGNOSIS — R931 Abnormal findings on diagnostic imaging of heart and coronary circulation: Secondary | ICD-10-CM

## 2023-04-11 NOTE — Telephone Encounter (Signed)
-----   Message from Kristeen Miss sent at 04/05/2023  5:02 PM EST ----- CAC score is 42.5 ( 70th percentile for age / sex matched controls )   Mild RCA stenosis Mild distal LM stenosis   Her LDL goal is < 70 Please have her return to get lipids , ALT and BMP

## 2023-04-11 NOTE — Telephone Encounter (Signed)
Pt reports that she's taking Atorvastatin 10mg  daily, but not listed on chart. Lab orders placed at this time. Pt agrees to get labs done. MAR updated. Will await results and then further plan.

## 2023-04-11 NOTE — Telephone Encounter (Signed)
Called and spoke with patient in detail about coronary calcium ct results. She agrees to get labs done and requests copy of CT results. Printed and placed up front for her to pick up when she comes for labs. Orders entered and released.  She is currently supposed to be taking Atorvastatin 10mg  daily, but admits that she hasn't been taking it regularly.

## 2023-05-21 ENCOUNTER — Other Ambulatory Visit: Payer: Self-pay

## 2023-05-21 ENCOUNTER — Inpatient Hospital Stay (HOSPITAL_COMMUNITY)
Admission: EM | Admit: 2023-05-21 | Discharge: 2023-05-24 | DRG: 190 | Disposition: A | Discharge status: Home / Self Care | Payer: Medicare HMO | Attending: Internal Medicine | Admitting: Internal Medicine

## 2023-05-21 ENCOUNTER — Emergency Department (HOSPITAL_COMMUNITY): Payer: Medicare HMO

## 2023-05-21 ENCOUNTER — Encounter (HOSPITAL_COMMUNITY): Payer: Self-pay | Admitting: Internal Medicine

## 2023-05-21 DIAGNOSIS — R636 Underweight: Secondary | ICD-10-CM | POA: Diagnosis present

## 2023-05-21 DIAGNOSIS — Z96642 Presence of left artificial hip joint: Secondary | ICD-10-CM | POA: Diagnosis present

## 2023-05-21 DIAGNOSIS — J9622 Acute and chronic respiratory failure with hypercapnia: Secondary | ICD-10-CM | POA: Diagnosis present

## 2023-05-21 DIAGNOSIS — E8729 Other acidosis: Secondary | ICD-10-CM | POA: Diagnosis present

## 2023-05-21 DIAGNOSIS — M545 Low back pain, unspecified: Secondary | ICD-10-CM | POA: Diagnosis present

## 2023-05-21 DIAGNOSIS — Z7952 Long term (current) use of systemic steroids: Secondary | ICD-10-CM

## 2023-05-21 DIAGNOSIS — F419 Anxiety disorder, unspecified: Secondary | ICD-10-CM | POA: Diagnosis present

## 2023-05-21 DIAGNOSIS — Z90710 Acquired absence of both cervix and uterus: Secondary | ICD-10-CM

## 2023-05-21 DIAGNOSIS — Z8249 Family history of ischemic heart disease and other diseases of the circulatory system: Secondary | ICD-10-CM

## 2023-05-21 DIAGNOSIS — Z833 Family history of diabetes mellitus: Secondary | ICD-10-CM

## 2023-05-21 DIAGNOSIS — E876 Hypokalemia: Secondary | ICD-10-CM | POA: Diagnosis present

## 2023-05-21 DIAGNOSIS — J441 Chronic obstructive pulmonary disease with (acute) exacerbation: Secondary | ICD-10-CM | POA: Diagnosis not present

## 2023-05-21 DIAGNOSIS — B974 Respiratory syncytial virus as the cause of diseases classified elsewhere: Secondary | ICD-10-CM | POA: Diagnosis present

## 2023-05-21 DIAGNOSIS — J9601 Acute respiratory failure with hypoxia: Secondary | ICD-10-CM | POA: Diagnosis present

## 2023-05-21 DIAGNOSIS — R0602 Shortness of breath: Secondary | ICD-10-CM | POA: Diagnosis not present

## 2023-05-21 DIAGNOSIS — I959 Hypotension, unspecified: Secondary | ICD-10-CM | POA: Diagnosis present

## 2023-05-21 DIAGNOSIS — I10 Essential (primary) hypertension: Secondary | ICD-10-CM | POA: Diagnosis present

## 2023-05-21 DIAGNOSIS — E785 Hyperlipidemia, unspecified: Secondary | ICD-10-CM | POA: Diagnosis present

## 2023-05-21 DIAGNOSIS — Z681 Body mass index (BMI) 19 or less, adult: Secondary | ICD-10-CM

## 2023-05-21 DIAGNOSIS — Z801 Family history of malignant neoplasm of trachea, bronchus and lung: Secondary | ICD-10-CM

## 2023-05-21 DIAGNOSIS — J9621 Acute and chronic respiratory failure with hypoxia: Secondary | ICD-10-CM | POA: Diagnosis present

## 2023-05-21 DIAGNOSIS — D649 Anemia, unspecified: Secondary | ICD-10-CM | POA: Diagnosis present

## 2023-05-21 DIAGNOSIS — F1120 Opioid dependence, uncomplicated: Secondary | ICD-10-CM | POA: Diagnosis present

## 2023-05-21 DIAGNOSIS — J9611 Chronic respiratory failure with hypoxia: Secondary | ICD-10-CM | POA: Diagnosis present

## 2023-05-21 DIAGNOSIS — M1990 Unspecified osteoarthritis, unspecified site: Secondary | ICD-10-CM | POA: Diagnosis present

## 2023-05-21 DIAGNOSIS — I2510 Atherosclerotic heart disease of native coronary artery without angina pectoris: Secondary | ICD-10-CM | POA: Diagnosis present

## 2023-05-21 DIAGNOSIS — F17210 Nicotine dependence, cigarettes, uncomplicated: Secondary | ICD-10-CM | POA: Diagnosis present

## 2023-05-21 DIAGNOSIS — Z808 Family history of malignant neoplasm of other organs or systems: Secondary | ICD-10-CM

## 2023-05-21 DIAGNOSIS — G8929 Other chronic pain: Secondary | ICD-10-CM | POA: Diagnosis present

## 2023-05-21 DIAGNOSIS — Z79899 Other long term (current) drug therapy: Secondary | ICD-10-CM

## 2023-05-21 LAB — CBC WITH DIFFERENTIAL/PLATELET
Abs Immature Granulocytes: 0.02 10*3/uL (ref 0.00–0.07)
Basophils Absolute: 0 10*3/uL (ref 0.0–0.1)
Basophils Relative: 0 %
Eosinophils Absolute: 0 10*3/uL (ref 0.0–0.5)
Eosinophils Relative: 1 %
HCT: 36.2 % (ref 36.0–46.0)
Hemoglobin: 11.4 g/dL — ABNORMAL LOW (ref 12.0–15.0)
Immature Granulocytes: 0 %
Lymphocytes Relative: 32 %
Lymphs Abs: 2.1 10*3/uL (ref 0.7–4.0)
MCH: 29.9 pg (ref 26.0–34.0)
MCHC: 31.5 g/dL (ref 30.0–36.0)
MCV: 95 fL (ref 80.0–100.0)
Monocytes Absolute: 0.6 10*3/uL (ref 0.1–1.0)
Monocytes Relative: 9 %
Neutro Abs: 3.8 10*3/uL (ref 1.7–7.7)
Neutrophils Relative %: 58 %
Platelets: 276 10*3/uL (ref 150–400)
RBC: 3.81 MIL/uL — ABNORMAL LOW (ref 3.87–5.11)
RDW: 14 % (ref 11.5–15.5)
WBC: 6.6 10*3/uL (ref 4.0–10.5)
nRBC: 0 % (ref 0.0–0.2)

## 2023-05-21 LAB — COMPREHENSIVE METABOLIC PANEL
ALT: 12 U/L (ref 0–44)
AST: 22 U/L (ref 15–41)
Albumin: 4 g/dL (ref 3.5–5.0)
Alkaline Phosphatase: 45 U/L (ref 38–126)
Anion gap: 10 (ref 5–15)
BUN: 13 mg/dL (ref 8–23)
CO2: 33 mmol/L — ABNORMAL HIGH (ref 22–32)
Calcium: 8.5 mg/dL — ABNORMAL LOW (ref 8.9–10.3)
Chloride: 92 mmol/L — ABNORMAL LOW (ref 98–111)
Creatinine, Ser: 0.69 mg/dL (ref 0.44–1.00)
GFR, Estimated: >60 mL/min (ref >60)
Glucose, Bld: 125 mg/dL — ABNORMAL HIGH (ref 70–99)
Potassium: 2.2 mmol/L — CL (ref 3.5–5.1)
Sodium: 135 mmol/L (ref 135–145)
Total Bilirubin: 0.6 mg/dL (ref <1.2)
Total Protein: 6.8 g/dL (ref 6.5–8.1)

## 2023-05-21 LAB — RESP PANEL BY RT-PCR (RSV, FLU A&B, COVID)  RVPGX2
Influenza A by PCR: NEGATIVE
Influenza B by PCR: NEGATIVE
Resp Syncytial Virus by PCR: POSITIVE — AB
SARS Coronavirus 2 by RT PCR: NEGATIVE

## 2023-05-21 LAB — BLOOD GAS, VENOUS
Acid-Base Excess: 10.2 mmol/L — ABNORMAL HIGH (ref 0.0–2.0)
Bicarbonate: 39.8 mmol/L — ABNORMAL HIGH (ref 20.0–28.0)
O2 Saturation: 72 %
Patient temperature: 37
pCO2, Ven: 79 mm[Hg] (ref 44–60)
pH, Ven: 7.31 (ref 7.25–7.43)
pO2, Ven: 41 mm[Hg] (ref 32–45)

## 2023-05-21 LAB — BRAIN NATRIURETIC PEPTIDE: B Natriuretic Peptide: 62.3 pg/mL (ref 0.0–100.0)

## 2023-05-21 LAB — TROPONIN I (HIGH SENSITIVITY): Troponin I (High Sensitivity): 7 ng/L (ref <18)

## 2023-05-21 LAB — MAGNESIUM: Magnesium: 2.1 mg/dL (ref 1.7–2.4)

## 2023-05-21 MED ORDER — ENSURE ENLIVE PO LIQD
237.0000 mL | Freq: Two times a day (BID) | ORAL | Status: DC
  Administered 2023-05-22 – 2023-05-24 (×4): 237 mL via ORAL

## 2023-05-21 MED ORDER — ATORVASTATIN CALCIUM 10 MG PO TABS
10.0000 mg | ORAL_TABLET | Freq: Every day | ORAL | Status: DC
  Administered 2023-05-22 – 2023-05-23 (×2): 10 mg via ORAL
  Filled 2023-05-21 (×3): qty 1

## 2023-05-21 MED ORDER — ACETAMINOPHEN 650 MG RE SUPP: 650.0000 mg | Freq: Four times a day (QID) | RECTAL | Status: DC | PRN

## 2023-05-21 MED ORDER — POTASSIUM CHLORIDE CRYS ER 20 MEQ PO TBCR: 40.0000 meq | EXTENDED_RELEASE_TABLET | ORAL | Status: DC

## 2023-05-21 MED ORDER — IPRATROPIUM-ALBUTEROL 0.5-2.5 (3) MG/3ML IN SOLN
3.0000 mL | Freq: Four times a day (QID) | RESPIRATORY_TRACT | Status: DC
  Administered 2023-05-21 (×2): 3 mL via RESPIRATORY_TRACT
  Filled 2023-05-21 (×2): qty 3

## 2023-05-21 MED ORDER — ONDANSETRON HCL 4 MG/2ML IJ SOLN: 4.0000 mg | Freq: Four times a day (QID) | INTRAMUSCULAR | Status: DC | PRN

## 2023-05-21 MED ORDER — ENOXAPARIN SODIUM 30 MG/0.3ML IJ SOSY
30.0000 mg | PREFILLED_SYRINGE | Freq: Every day | INTRAMUSCULAR | Status: DC
  Administered 2023-05-21 – 2023-05-24 (×4): 30 mg via SUBCUTANEOUS
  Filled 2023-05-21 (×4): qty 0.3

## 2023-05-21 MED ORDER — OXYCODONE HCL 5 MG PO TABS
5.0000 mg | ORAL_TABLET | ORAL | Status: DC | PRN
  Administered 2023-05-21 – 2023-05-24 (×13): 5 mg via ORAL
  Filled 2023-05-21 (×13): qty 1

## 2023-05-21 MED ORDER — ORAL CARE MOUTH RINSE: 15.0000 mL | OROMUCOSAL | Status: DC | PRN

## 2023-05-21 MED ORDER — AZITHROMYCIN 250 MG PO TABS
500.0000 mg | ORAL_TABLET | Freq: Every day | ORAL | Status: DC
  Administered 2023-05-22 – 2023-05-24 (×3): 500 mg via ORAL
  Filled 2023-05-21 (×3): qty 2

## 2023-05-21 MED ORDER — ALBUTEROL SULFATE (2.5 MG/3ML) 0.083% IN NEBU
10.0000 mg/h | INHALATION_SOLUTION | RESPIRATORY_TRACT | Status: AC
Start: 2023-05-21 — End: 2023-05-21
  Administered 2023-05-21: 10 mg/h via RESPIRATORY_TRACT
  Filled 2023-05-21 (×4): qty 3
  Filled 2023-05-21: qty 9
  Filled 2023-05-21: qty 3

## 2023-05-21 MED ORDER — ZOLPIDEM TARTRATE 5 MG PO TABS
5.0000 mg | ORAL_TABLET | Freq: Every evening | ORAL | Status: DC | PRN
  Administered 2023-05-21 – 2023-05-22 (×2): 5 mg via ORAL
  Filled 2023-05-21 (×2): qty 1

## 2023-05-21 MED ORDER — PREDNISONE 20 MG PO TABS: 40.0000 mg | ORAL_TABLET | Freq: Every day | ORAL | Status: DC

## 2023-05-21 MED ORDER — OXYCODONE-ACETAMINOPHEN 10-325 MG PO TABS: 1.0000 | ORAL_TABLET | ORAL | Status: DC | PRN

## 2023-05-21 MED ORDER — TRAZODONE HCL 50 MG PO TABS: 25.0000 mg | ORAL_TABLET | Freq: Every evening | ORAL | Status: DC | PRN

## 2023-05-21 MED ORDER — POLYETHYLENE GLYCOL 3350 17 G PO PACK
17.0000 g | PACK | Freq: Every day | ORAL | Status: DC | PRN
  Administered 2023-05-22: 17 g via ORAL
  Filled 2023-05-21: qty 1

## 2023-05-21 MED ORDER — ACETAMINOPHEN 325 MG PO TABS
650.0000 mg | ORAL_TABLET | Freq: Four times a day (QID) | ORAL | Status: DC | PRN
  Administered 2023-05-22: 650 mg via ORAL
  Filled 2023-05-21: qty 2

## 2023-05-21 MED ORDER — FENTANYL CITRATE PF 50 MCG/ML IJ SOSY
50.0000 ug | PREFILLED_SYRINGE | Freq: Once | INTRAMUSCULAR | Status: DC
  Filled 2023-05-21: qty 1

## 2023-05-21 MED ORDER — POTASSIUM CHLORIDE 10 MEQ/100ML IV SOLN
10.0000 meq | INTRAVENOUS | Status: AC
  Administered 2023-05-21 (×6): 10 meq via INTRAVENOUS
  Filled 2023-05-21 (×6): qty 100

## 2023-05-21 MED ORDER — OXYCODONE-ACETAMINOPHEN 5-325 MG PO TABS
1.0000 | ORAL_TABLET | ORAL | Status: DC | PRN
  Administered 2023-05-21 – 2023-05-24 (×13): 1 via ORAL
  Filled 2023-05-21 (×13): qty 1

## 2023-05-21 MED ORDER — POTASSIUM CHLORIDE CRYS ER 20 MEQ PO TBCR
40.0000 meq | EXTENDED_RELEASE_TABLET | Freq: Once | ORAL | Status: AC
  Administered 2023-05-21: 40 meq via ORAL
  Filled 2023-05-21: qty 4

## 2023-05-21 MED ORDER — SODIUM CHLORIDE 0.9 % IV SOLN
1.0000 g | Freq: Once | INTRAVENOUS | Status: AC
  Administered 2023-05-21: 1 g via INTRAVENOUS
  Filled 2023-05-21: qty 10

## 2023-05-21 MED ORDER — ALBUTEROL SULFATE (2.5 MG/3ML) 0.083% IN NEBU
2.5000 mg | INHALATION_SOLUTION | RESPIRATORY_TRACT | Status: DC | PRN
  Administered 2023-05-22 – 2023-05-24 (×6): 2.5 mg via RESPIRATORY_TRACT
  Filled 2023-05-21 (×6): qty 3

## 2023-05-21 MED ORDER — POTASSIUM CHLORIDE CRYS ER 20 MEQ PO TBCR
20.0000 meq | EXTENDED_RELEASE_TABLET | Freq: Every day | ORAL | Status: DC
  Administered 2023-05-22 – 2023-05-24 (×3): 20 meq via ORAL
  Filled 2023-05-21 (×3): qty 1

## 2023-05-21 MED ORDER — SODIUM CHLORIDE 0.9 % IV SOLN
500.0000 mg | Freq: Once | INTRAVENOUS | Status: AC
  Administered 2023-05-21: 500 mg via INTRAVENOUS
  Filled 2023-05-21: qty 5

## 2023-05-21 MED ORDER — ONDANSETRON HCL 4 MG PO TABS
4.0000 mg | ORAL_TABLET | Freq: Four times a day (QID) | ORAL | Status: DC | PRN
Start: 2023-05-21 — End: 2023-05-24

## 2023-05-21 MED ORDER — POTASSIUM CHLORIDE 10 MEQ/100ML IV SOLN
INTRAVENOUS | Status: AC
  Filled 2023-05-21: qty 100

## 2023-05-21 MED ORDER — MORPHINE SULFATE (PF) 4 MG/ML IV SOLN
4.0000 mg | Freq: Once | INTRAVENOUS | Status: AC
  Administered 2023-05-21: 4 mg via INTRAVENOUS
  Filled 2023-05-21: qty 1

## 2023-05-21 MED ORDER — OXYCODONE HCL 5 MG PO TABS
5.0000 mg | ORAL_TABLET | ORAL | Status: DC | PRN
  Administered 2023-05-21 (×2): 5 mg via ORAL
  Filled 2023-05-21 (×2): qty 1

## 2023-05-21 NOTE — Progress Notes (Signed)
   05/21/23 2126  BiPAP/CPAP/SIPAP  Reason BIPAP/CPAP not in use Other(comment) (PRN-Remains in room.)

## 2023-05-21 NOTE — Progress Notes (Signed)
   05/21/23 2126  BiPAP/CPAP/SIPAP  Reason BIPAP/CPAP not in use Other(comment) (PRN-Remains in room.)   Pt. seen on rounds, V-60 Bipap remains in room for Prn use being initial day of use, RT to monitor.

## 2023-05-21 NOTE — ED Triage Notes (Signed)
PT BIB EMS coming from home c/o sob that started this morning. Called EMS at 2am got a breathing treatment did not want to come in at that time. Called Ems at 7-8 am again because woke up with worsening sob and chest pain, Took breathing treatrment at home with no relief. HX of COPD  BP 154/86, HR 64, RR 30, Spo2 86% RA, 100% on mask 10-12 L   20g R-AC, 125 solumedrol, 20 albuterol, 1 atrovent given en route

## 2023-05-21 NOTE — H&P (Signed)
History and Physical  [name] ZOX:[id number] DOB: 1956/02/23 DOA: 05/21/2023  PCP: Karenann Cai, NP   Chief Complaint: Shortness of breath  HPI: [name] is a 67 y.o. female with medical history significant for hypertension, COPD on room air being admitted to the hospital with acute exacerbation of COPD in the setting of RSV.  Patient states she has been intermittently short of breath with some increased cough without fevers, chest pain, or chest tightness for about the last week.  She works part-time at [employer], no specific sick contacts but she does interact with the public regularly.  Last night, called EMS at 2 AM due to shortness of breath, got a breathing treatment and felt better declined hospital evaluation.  However called them back again this morning about 8 AM due to recurrent symptoms came to the ER for evaluation.  This time, breathing treatment at home did not help, she was given 125 Solu-Medrol, as well as albuterol neb and Atrovent en route.  Here, due to continued work of breathing she was placed on BiPAP.  Currently, states she is feeling better and BiPAP was removed by RT she was placed on 2 L nasal cannula oxygen saturating 100% currently.  Review of Systems: Please see HPI for pertinent positives and negatives. A complete 10 system review of systems are otherwise negative.  Past Medical History:  Diagnosis Date   Arthritis    Asthma    Chest pain 2006   Chest pain 10/2016   Chronic back pain    Hypertension    Sciatica    Shortness of breath    due to  smoking    Past Surgical History:  Procedure Laterality Date   ABDOMINAL HYSTERECTOMY  1997   CARDIAC CATHETERIZATION  2006   HEMI-MICRODISCECTOMY LUMBAR LAMINECTOMY LEVEL 1  05/14/2012   Procedure: HEMI-MICRODISCECTOMY LUMBAR LAMINECTOMY LEVEL 1;  Surgeon: Jacki Cones, MD;  Location: [facility];  Service: Orthopedics;  Laterality: N/A;  Hemi Laminectomy Microdiscectomy L4 - L5 Central (X-Ray)    ingrown toenail removal     TONSILLECTOMY     TOTAL HIP ARTHROPLASTY Left 11/05/2013   Procedure: LEFT TOTAL HIP ARTHROPLASTY;  Surgeon: Jacki Cones, MD;  Location: [facility];  Service: Orthopedics;  Laterality: Left;   Social History:  reports that she has been smoking cigarettes. She has a 3 pack-year smoking history. She has never used smokeless tobacco. She reports that she does not drink alcohol and does not use drugs.  No Known Allergies  Family History  Problem Relation Age of Onset   Heart attack Mother 58   Hypertension Sister    Diabetes type II Sister    Tongue cancer Brother    Heart attack Sister 80   Lung cancer Sister    Hypertension Sister      Prior to Admission medications   Medication Sig Start Date End Date Taking? Authorizing Provider  albuterol (PROVENTIL) (2.5 MG/3ML) 0.083% nebulizer solution Take 3 mLs (2.5 mg total) by nebulization every 6 (six) hours as needed for wheezing or shortness of breath. 06/10/21   [name], PA-C  albuterol (VENTOLIN HFA) 108 (90 Base) MCG/ACT inhaler Inhale 2 puffs into the lungs every 4 (four) hours as needed for wheezing or shortness of breath. 10/11/22   [name], [name], DO  atorvastatin (LIPITOR) 10 MG tablet Take 10 mg by mouth daily. 03/20/23   [provider]  enalapril (VASOTEC) 10 MG tablet Take 1 tablet (10 mg total)  by mouth daily. 10/11/22 01/09/23  [name], [name], DO  metoprolol tartrate (LOPRESSOR) 50 MG tablet Take 1 tablet by mouth two hours prior to scan 02/15/23   Nahser, Deloris Ping, MD  Multiple Vitamin (MULTIVITAMIN) capsule Take 1 capsule by mouth daily.    [provider]  oxyCODONE-acetaminophen (PERCOCET) 10-325 MG tablet Take 1 tablet by mouth every 4 (four) hours as needed for pain. 12/23/20   [provider]  potassium chloride SA (KLOR-CON M) 20 MEQ tablet Take 1 tablet (20 mEq total) by mouth daily. 03/29/23   [name], MD  predniSONE (DELTASONE) 10 MG tablet Take 1  tablet (10 mg total) by mouth daily with breakfast. 11/05/22   Martina Sinner, MD  predniSONE (DELTASONE) 50 MG tablet Take 1 tablet (50 mg total) by mouth daily with breakfast. 03/29/23   [name], MD  zolpidem (AMBIEN) 5 MG tablet Take 5 mg by mouth at bedtime. 12/25/20   [provider]    Physical Exam: BP 94/70 (BP Location: Left Arm)   Pulse 70   Temp 97.6 F (36.4 C) (Oral)   Resp 17   Ht 5\' 1"  (1.549 m)   Wt 42.2 kg   SpO2 100%   BMI 17.57 kg/m  General:  Alert, oriented, calm, in no acute distress, no cough, resting comfortably on nasal cannula after taken off BiPAP.  Speaking in full sentences, looks tired. Cardiovascular: RRR, no murmurs or rubs, no peripheral edema  Respiratory: Reduced air movement in all lung fields, no wheezing, no rhonchi, no tachypnea Abdomen: soft, nontender, nondistended, normal bowel tones heard  Skin: dry, no rashes  Musculoskeletal: no joint effusions, normal range of motion  Psychiatric: appropriate affect, normal speech  Neurologic: extraocular muscles intact, clear speech, moving all extremities with intact sensorium         Labs on Admission:  Basic Metabolic Panel: Recent Labs  Lab 05/21/23 0939  NA 135  K 2.2*  CL 92*  CO2 33*  GLUCOSE 125*  BUN 13  CREATININE 0.69  CALCIUM 8.5*  MG 2.1   Liver Function Tests: Recent Labs  Lab 05/21/23 0939  AST 22  ALT 12  ALKPHOS 45  BILITOT 0.6  PROT 6.8  ALBUMIN 4.0   No results for input(s): "LIPASE", "AMYLASE" in the last 168 hours. No results for input(s): "AMMONIA" in the last 168 hours. CBC: Recent Labs  Lab 05/21/23 0939  WBC 6.6  NEUTROABS 3.8  HGB 11.4*  HCT 36.2  MCV 95.0  PLT 276   Cardiac Enzymes: No results for input(s): "CKTOTAL", "CKMB", "CKMBINDEX", "TROPONINI" in the last 168 hours. BNP (last 3 results) Recent Labs    03/29/23 1834 05/21/23 0939  BNP 22.0 62.3    ProBNP (last 3 results) No results for input(s): "PROBNP" in the  last 8760 hours.  CBG: No results for input(s): "GLUCAP" in the last 168 hours.  Radiological Exams on Admission: DG Chest Port 1 View Result Date: 05/21/2023 CLINICAL DATA:  Dyspnea. EXAM: PORTABLE CHEST 1 VIEW COMPARISON:  March 29, 2023. FINDINGS: The heart size and mediastinal contours are within normal limits. Both lungs are clear. The visualized skeletal structures are unremarkable. IMPRESSION: No active disease. Electronically Signed   By: Lupita Raider M.D.   On: 05/21/2023 09:39   Assessment/Plan [name] is a 67 y.o. female with medical history significant for hypertension, COPD on room air being admitted to the hospital with acute exacerbation of COPD in the setting of RSV.  Acute exacerbation of COPD-with increased cough, dyspnea.  In the setting of RSV infection which is likely the trigger.  No evidence of acute bacterial infection. -Observation admission -Scheduled DuoNeb, as needed albuterol -Started on empiric antibiotics, continue oral azithromycin from tomorrow -Incentive spirometer and flutter valve -Will initiate oral prednisone from tomorrow  Hypotension-patient has a prior history of hypertension, blood pressure on the low side but asymptomatic on BiPAP.  Blood pressure now improved after taken off BiPAP.  Will monitor vital signs per floor protocol.  Hypokalemia-she has a history of this, prescribed daily potassium but does not take it routinely.  Note normal magnesium level. -Continue potassium supplementation -Recheck with morning labs  Hyperlipidemia-Lipitor  DVT prophylaxis: Lovenox     Code Status: Full Code  Consults called: None  Admission status: Observation  Time spent: 48 minutes  Rasheedah Reis Sharlette Dense MD [facility] Pager [phone]  If 7PM-7AM, please contact night-coverage [email or web address] Password [facility]  05/21/2023, 11:52 AM

## 2023-05-21 NOTE — ED Provider Notes (Addendum)
EMERGENCY DEPARTMENT AT [facility] Provider Note   CSN: [id number] Arrival date & time: 05/21/23  0865     History  Chief Complaint  Patient presents with   Shortness of Breath   COPD exacerbation    [name] is a 67 y.o. female.  HPI Patient presents for shortness of breath.  Medical history includes HTN, arthritis, COPD, CAD, vocal cord dysfunction, anxiety.  She has had shortness of breath over the past week.  She has been utilizing her home nebulized breathing treatments.  She has had a cough with increased feeding production.  Sputum is described as white in color.  She has oxygen at home but typically wears only at night.  Shortness of breath worsened last night prompting her to call EMS.  She received treatment at home but declined transport to the hospital.  She had worsened symptoms this morning which prompted another call to EMS.  When EMS arrived on scene, patient was hypoxic in the mid 80s.  She was tachypneic with a rate of 35 to 40 breaths/min.  She had diminished breath sounds and diffuse wheezing.  She received 125 mg of Solu-Medrol, 10 mg of albuterol, and 2 mg of Atrovent prior to arrival.  Lung sounds have improved.  Patient reports mild improvement in symptoms.  She did have some transient chest pain prior to arrival which has resolved.    Home Medications Prior to Admission medications   Medication Sig Start Date End Date Taking? Authorizing Provider  albuterol (PROVENTIL) (2.5 MG/3ML) 0.083% nebulizer solution Take 3 mLs (2.5 mg total) by nebulization every 6 (six) hours as needed for wheezing or shortness of breath. 06/10/21   [name], PA-C  albuterol (VENTOLIN HFA) 108 (90 Base) MCG/ACT inhaler Inhale 2 puffs into the lungs every 4 (four) hours as needed for wheezing or shortness of breath. 10/11/22   [name], [name], DO  atorvastatin (LIPITOR) 10 MG tablet Take 10 mg by mouth daily. 03/20/23   [provider]   enalapril (VASOTEC) 10 MG tablet Take 1 tablet (10 mg total) by mouth daily. 10/11/22 01/09/23  [name], [name], DO  metoprolol tartrate (LOPRESSOR) 50 MG tablet Take 1 tablet by mouth two hours prior to scan 02/15/23   Nahser, Deloris Ping, MD  Multiple Vitamin (MULTIVITAMIN) capsule Take 1 capsule by mouth daily.    [provider]  oxyCODONE-acetaminophen (PERCOCET) 10-325 MG tablet Take 1 tablet by mouth every 4 (four) hours as needed for pain. 12/23/20   [provider]  potassium chloride SA (KLOR-CON M) 20 MEQ tablet Take 1 tablet (20 mEq total) by mouth daily. 03/29/23   [name], MD  predniSONE (DELTASONE) 10 MG tablet Take 1 tablet (10 mg total) by mouth daily with breakfast. 11/05/22   Martina Sinner, MD  predniSONE (DELTASONE) 50 MG tablet Take 1 tablet (50 mg total) by mouth daily with breakfast. 03/29/23   [name], MD  zolpidem (AMBIEN) 5 MG tablet Take 5 mg by mouth at bedtime. 12/25/20   [provider]      Allergies    Patient has no known allergies.    Review of Systems   Review of Systems  Respiratory:  Positive for cough, chest tightness, shortness of breath and wheezing.   Cardiovascular:  Positive for chest pain.  All other systems reviewed and are negative.   Physical Exam Updated Vital Signs BP 94/70 (BP Location: Left Arm)   Pulse 70   Temp  97.6 F (36.4 C) (Oral)   Resp 17   Ht 5\' 1"  (1.549 m)   Wt 42.2 kg   SpO2 100%   BMI 17.57 kg/m  Physical Exam Vitals and nursing note reviewed.  Constitutional:      Appearance: Normal appearance. She is well-developed. She is ill-appearing. She is not toxic-appearing or diaphoretic.  HENT:     Head: Normocephalic and atraumatic.     Right Ear: External ear normal.     Left Ear: External ear normal.     Nose: Nose normal.     Mouth/Throat:     Mouth: Mucous membranes are moist.  Eyes:     Extraocular Movements: Extraocular movements intact.     Conjunctiva/sclera:  Conjunctivae normal.  Cardiovascular:     Rate and Rhythm: Normal rate and regular rhythm.     Heart sounds: No murmur heard. Pulmonary:     Effort: Tachypnea, accessory muscle usage and prolonged expiration present.     Breath sounds: Decreased air movement present. Decreased breath sounds, wheezing and rhonchi present.  Abdominal:     General: There is no distension.     Palpations: Abdomen is soft.     Tenderness: There is no abdominal tenderness.  Musculoskeletal:        General: No swelling. Normal range of motion.     Cervical back: Normal range of motion and neck supple.     Right lower leg: No edema.     Left lower leg: No edema.  Skin:    General: Skin is warm and dry.     Coloration: Skin is not jaundiced or pale.  Neurological:     General: No focal deficit present.     Mental Status: She is alert and oriented to person, place, and time.  Psychiatric:        Mood and Affect: Mood normal.        Behavior: Behavior normal.     ED Results / Procedures / Treatments   Labs (all labs ordered are listed, but only abnormal results are displayed) Labs Reviewed  RESP PANEL BY RT-PCR (RSV, FLU A&B, COVID)  RVPGX2 - Abnormal; Notable for the following components:      Result Value   Resp Syncytial Virus by PCR POSITIVE (*)    All other components within normal limits  COMPREHENSIVE METABOLIC PANEL - Abnormal; Notable for the following components:   Potassium 2.2 (*)    Chloride 92 (*)    CO2 33 (*)    Glucose, Bld 125 (*)    Calcium 8.5 (*)    All other components within normal limits  BLOOD GAS, VENOUS - Abnormal; Notable for the following components:   pCO2, Ven 79 (*)    Bicarbonate 39.8 (*)    Acid-Base Excess 10.2 (*)    All other components within normal limits  CBC WITH DIFFERENTIAL/PLATELET - Abnormal; Notable for the following components:   RBC 3.81 (*)    Hemoglobin 11.4 (*)    All other components within normal limits  BRAIN NATRIURETIC PEPTIDE   MAGNESIUM  TROPONIN I (HIGH SENSITIVITY)  TROPONIN I (HIGH SENSITIVITY)    EKG EKG Interpretation Date/Time:  Tuesday May 21 2023 09:06:02 EST Ventricular Rate:  75 PR Interval:  144 QRS Duration:  95 QT Interval:  373 QTC Calculation: 417 R Axis:   92  Text Interpretation: Sinus rhythm Atrial premature complex Right axis deviation Abnormal lateral Q waves Probable anteroseptal infarct, old Confirmed by Durwin Nora, Darion Juhasz ([id number]) on  05/21/2023 9:47:19 AM  Radiology DG Chest Port 1 View Result Date: 05/21/2023 CLINICAL DATA:  Dyspnea. EXAM: PORTABLE CHEST 1 VIEW COMPARISON:  March 29, 2023. FINDINGS: The heart size and mediastinal contours are within normal limits. Both lungs are clear. The visualized skeletal structures are unremarkable. IMPRESSION: No active disease. Electronically Signed   By: Lupita Raider M.D.   On: 05/21/2023 09:39    Procedures Procedures    Medications Ordered in ED Medications  albuterol (PROVENTIL) (2.5 MG/3ML) 0.083% nebulizer solution (10 mg/hr Nebulization New Bag/Given 05/21/23 0934)  potassium chloride 10 mEq in 100 mL IVPB (10 mEq Intravenous New Bag/Given 05/21/23 1125)  atorvastatin (LIPITOR) tablet 10 mg (has no administration in time range)  potassium chloride SA (KLOR-CON M) CR tablet 20 mEq (has no administration in time range)  ipratropium-albuterol (DUONEB) 0.5-2.5 (3) MG/3ML nebulizer solution 3 mL (has no administration in time range)  enoxaparin (LOVENOX) injection 30 mg (has no administration in time range)  acetaminophen (TYLENOL) tablet 650 mg (has no administration in time range)    Or  acetaminophen (TYLENOL) suppository 650 mg (has no administration in time range)  traZODone (DESYREL) tablet 25 mg (has no administration in time range)  ondansetron (ZOFRAN) tablet 4 mg (has no administration in time range)    Or  ondansetron (ZOFRAN) injection 4 mg (has no administration in time range)  albuterol (PROVENTIL) (2.5 MG/3ML)  0.083% nebulizer solution 2.5 mg (has no administration in time range)  azithromycin (ZITHROMAX) tablet 500 mg (has no administration in time range)  predniSONE (DELTASONE) tablet 40 mg (has no administration in time range)  cefTRIAXone (ROCEPHIN) 1 g in sodium chloride 0.9 % 100 mL IVPB (0 g Intravenous Stopped 05/21/23 1004)  azithromycin (ZITHROMAX) 500 mg in sodium chloride 0.9 % 250 mL IVPB (0 mg Intravenous Stopped 05/21/23 1129)  morphine (PF) 4 MG/ML injection 4 mg (4 mg Intravenous Given 05/21/23 1007)  potassium chloride SA (KLOR-CON M) CR tablet 40 mEq (40 mEq Oral Given 05/21/23 1111)    ED Course/ Medical Decision Making/ A&P                                 Medical Decision Making Amount and/or Complexity of Data Reviewed Labs: ordered. Radiology: ordered.  Risk Prescription drug management. Decision regarding hospitalization.   This patient presents to the ED for concern of shortness of breath, this involves an extensive number of treatment options, and is a complaint that carries with it a high risk of complications and morbidity.  The differential diagnosis includes COPD exacerbation, URI, pneumonia, CHF, anemia, acidosis   Co morbidities that complicate the patient evaluation  HTN, arthritis, COPD, CAD, vocal cord dysfunction, anxiety   Additional history obtained:  Additional history obtained from EMS External records from outside source obtained and reviewed including EMR   Lab Tests:  I Ordered, and personally interpreted labs.  The pertinent results include: Baseline anemia, no leukocytosis, normal kidney function.  Hypokalemia is present.  There is a mild respiratory acidosis on blood gas.  Patient tested positive for RSV.   Imaging Studies ordered:  I ordered imaging studies including chest x-ray I independently visualized and interpreted imaging which showed no acute findings I agree with the radiologist interpretation   Cardiac Monitoring: /  EKG:  The patient was maintained on a cardiac monitor.  I personally viewed and interpreted the cardiac monitored which showed an underlying rhythm of: Sinus rhythm  Problem List / ED Course / Critical interventions / Medication management  Patient presenting for shortness of breath, worsening over the past week.  On arrival, patient is tachypneic.  She has diminished breath sounds and wheezing on lung auscultation.  Presentation consistent with COPD exacerbation.  She has received Solu-Medrol and nebulized breathing treatments prior to arrival.  Will continue albuterol.  Given her work of breathing, BiPAP was ordered.  Given her increased sputum production, will initiate antibiotics.  Workup was initiated.  While on BiPAP, patient endorsed worsening of chronic low back pain in addition to some left-sided chest pain.  Fentanyl was ordered for analgesia.  EKG does not show any concerning ST segment changes.  Initial troponin was normal.  Patient's lab work was notable for respiratory acidosis and hypokalemia.  Replacement potassium was ordered.  She continued to tolerate BiPAP.  She had improved pain.  She did test positive for RSV as which is likely the cause of her COPD exacerbation.  She was admitted for further management. I ordered medication including albuterol, ceftriaxone, azithromycin for COPD exacerbation; morphine for analgesia; potassium chloride for hypokalemia Reevaluation of the patient after these medicines showed that the patient improved I have reviewed the patients home medicines and have made adjustments as needed   Social Determinants of Health:  Lives independently  CRITICAL CARE Performed by: Gloris Manchester   Total critical care time: 34 minutes  Critical care time was exclusive of separately billable procedures and treating other patients.  Critical care was necessary to treat or prevent imminent or life-threatening deterioration.  Critical care was time spent personally  by me on the following activities: development of treatment plan with patient and/or surrogate as well as nursing, discussions with consultants, evaluation of patient's response to treatment, examination of patient, obtaining history from patient or surrogate, ordering and performing treatments and interventions, ordering and review of laboratory studies, ordering and review of radiographic studies, pulse oximetry and re-evaluation of patient's condition.       Final Clinical Impression(s) / ED Diagnoses Final diagnoses:  COPD exacerbation (HCC)  Acute on chronic respiratory failure with hypoxia and hypercapnia (HCC)  Hypokalemia    Rx / DC Orders ED Discharge Orders     None         Gloris Manchester, MD 05/21/23 1210    Gloris Manchester, MD 05/21/23 1210

## 2023-05-22 DIAGNOSIS — E8729 Other acidosis: Secondary | ICD-10-CM | POA: Diagnosis present

## 2023-05-22 DIAGNOSIS — F419 Anxiety disorder, unspecified: Secondary | ICD-10-CM | POA: Diagnosis present

## 2023-05-22 DIAGNOSIS — F17210 Nicotine dependence, cigarettes, uncomplicated: Secondary | ICD-10-CM | POA: Diagnosis present

## 2023-05-22 DIAGNOSIS — R636 Underweight: Secondary | ICD-10-CM | POA: Diagnosis present

## 2023-05-22 DIAGNOSIS — J441 Chronic obstructive pulmonary disease with (acute) exacerbation: Principal | ICD-10-CM | POA: Diagnosis present

## 2023-05-22 DIAGNOSIS — R0602 Shortness of breath: Secondary | ICD-10-CM | POA: Diagnosis present

## 2023-05-22 DIAGNOSIS — Z96642 Presence of left artificial hip joint: Secondary | ICD-10-CM | POA: Diagnosis present

## 2023-05-22 DIAGNOSIS — F1120 Opioid dependence, uncomplicated: Secondary | ICD-10-CM | POA: Diagnosis present

## 2023-05-22 DIAGNOSIS — Z79899 Other long term (current) drug therapy: Secondary | ICD-10-CM | POA: Diagnosis not present

## 2023-05-22 DIAGNOSIS — J9621 Acute and chronic respiratory failure with hypoxia: Secondary | ICD-10-CM | POA: Diagnosis present

## 2023-05-22 DIAGNOSIS — Z681 Body mass index (BMI) 19 or less, adult: Secondary | ICD-10-CM | POA: Diagnosis not present

## 2023-05-22 DIAGNOSIS — E876 Hypokalemia: Secondary | ICD-10-CM | POA: Diagnosis present

## 2023-05-22 DIAGNOSIS — Z808 Family history of malignant neoplasm of other organs or systems: Secondary | ICD-10-CM | POA: Diagnosis not present

## 2023-05-22 DIAGNOSIS — I2510 Atherosclerotic heart disease of native coronary artery without angina pectoris: Secondary | ICD-10-CM | POA: Diagnosis present

## 2023-05-22 DIAGNOSIS — R062 Wheezing: Secondary | ICD-10-CM | POA: Diagnosis not present

## 2023-05-22 DIAGNOSIS — J9622 Acute and chronic respiratory failure with hypercapnia: Secondary | ICD-10-CM | POA: Diagnosis present

## 2023-05-22 DIAGNOSIS — E785 Hyperlipidemia, unspecified: Secondary | ICD-10-CM | POA: Diagnosis present

## 2023-05-22 DIAGNOSIS — Z801 Family history of malignant neoplasm of trachea, bronchus and lung: Secondary | ICD-10-CM | POA: Diagnosis not present

## 2023-05-22 DIAGNOSIS — D649 Anemia, unspecified: Secondary | ICD-10-CM | POA: Diagnosis present

## 2023-05-22 DIAGNOSIS — Z833 Family history of diabetes mellitus: Secondary | ICD-10-CM | POA: Diagnosis not present

## 2023-05-22 DIAGNOSIS — B974 Respiratory syncytial virus as the cause of diseases classified elsewhere: Secondary | ICD-10-CM | POA: Diagnosis present

## 2023-05-22 DIAGNOSIS — M1990 Unspecified osteoarthritis, unspecified site: Secondary | ICD-10-CM | POA: Diagnosis present

## 2023-05-22 DIAGNOSIS — Z8249 Family history of ischemic heart disease and other diseases of the circulatory system: Secondary | ICD-10-CM | POA: Diagnosis not present

## 2023-05-22 DIAGNOSIS — I10 Essential (primary) hypertension: Secondary | ICD-10-CM | POA: Diagnosis present

## 2023-05-22 DIAGNOSIS — G8929 Other chronic pain: Secondary | ICD-10-CM | POA: Diagnosis present

## 2023-05-22 DIAGNOSIS — Z90710 Acquired absence of both cervix and uterus: Secondary | ICD-10-CM | POA: Diagnosis not present

## 2023-05-22 LAB — BASIC METABOLIC PANEL
Anion gap: 5 (ref 5–15)
BUN: 16 mg/dL (ref 8–23)
CO2: 33 mmol/L — ABNORMAL HIGH (ref 22–32)
Calcium: 8.5 mg/dL — ABNORMAL LOW (ref 8.9–10.3)
Chloride: 101 mmol/L (ref 98–111)
Creatinine, Ser: 0.57 mg/dL (ref 0.44–1.00)
GFR, Estimated: >60 mL/min (ref >60)
Glucose, Bld: 99 mg/dL (ref 70–99)
Potassium: 4.1 mmol/L (ref 3.5–5.1)
Sodium: 139 mmol/L (ref 135–145)

## 2023-05-22 LAB — CBC
HCT: 33.6 % — ABNORMAL LOW (ref 36.0–46.0)
Hemoglobin: 10.1 g/dL — ABNORMAL LOW (ref 12.0–15.0)
MCH: 29.6 pg (ref 26.0–34.0)
MCHC: 30.1 g/dL (ref 30.0–36.0)
MCV: 98.5 fL (ref 80.0–100.0)
Platelets: 257 10*3/uL (ref 150–400)
RBC: 3.41 MIL/uL — ABNORMAL LOW (ref 3.87–5.11)
RDW: 14.5 % (ref 11.5–15.5)
WBC: 5 10*3/uL (ref 4.0–10.5)
nRBC: 0 % (ref 0.0–0.2)

## 2023-05-22 MED ORDER — ADULT MULTIVITAMIN W/MINERALS CH
1.0000 | ORAL_TABLET | Freq: Every day | ORAL | Status: DC
Start: 2023-05-22 — End: 2023-05-24
  Administered 2023-05-22 – 2023-05-24 (×3): 1 via ORAL
  Filled 2023-05-22 (×3): qty 1

## 2023-05-22 MED ORDER — METHYLPREDNISOLONE SODIUM SUCC 125 MG IJ SOLR
80.0000 mg | Freq: Every day | INTRAMUSCULAR | Status: DC
  Administered 2023-05-22 – 2023-05-23 (×2): 80 mg via INTRAVENOUS
  Filled 2023-05-22 (×2): qty 2

## 2023-05-22 MED ORDER — MOMETASONE FURO-FORMOTEROL FUM 100-5 MCG/ACT IN AERO
2.0000 | INHALATION_SPRAY | Freq: Two times a day (BID) | RESPIRATORY_TRACT | Status: DC
  Administered 2023-05-22 – 2023-05-24 (×3): 2 via RESPIRATORY_TRACT
  Filled 2023-05-22 (×2): qty 8.8

## 2023-05-22 MED ORDER — IPRATROPIUM-ALBUTEROL 0.5-2.5 (3) MG/3ML IN SOLN
3.0000 mL | Freq: Four times a day (QID) | RESPIRATORY_TRACT | Status: DC
  Administered 2023-05-22 – 2023-05-23 (×7): 3 mL via RESPIRATORY_TRACT
  Filled 2023-05-22 (×7): qty 3

## 2023-05-22 MED ORDER — ALPRAZOLAM 0.25 MG PO TABS
0.2500 mg | ORAL_TABLET | Freq: Three times a day (TID) | ORAL | Status: DC | PRN
  Administered 2023-05-22 – 2023-05-23 (×4): 0.25 mg via ORAL
  Filled 2023-05-22 (×4): qty 1

## 2023-05-22 NOTE — Progress Notes (Signed)
   05/22/23 0100  BiPAP/CPAP/SIPAP  $ Non-Invasive Ventilator  Non-Invasive Vent Subsequent  Reason BIPAP/CPAP not in use Other(comment) (Remains at bedside if needed during remainder of 7A-7P shift.)

## 2023-05-22 NOTE — Progress Notes (Signed)
PROGRESS NOTE    [name]  ZOX:[id number] DOB: November 16, 1955 DOA: 05/21/2023 PCP: Karenann Cai, NP   Brief Narrative:  67 y.o. female with medical history significant for hypertension, COPD and chronic back pain presented with worsening shortness of breath.  She initially required BiPAP on presentation but was subsequently switched to nasal cannula oxygen.  She tested positive for RSV.  Chest x-ray showed no active disease.  She was started on IV steroids along with nebs.  Assessment & Plan:   Acute respiratory failure with hypoxia COPD exacerbation due to RSV infection Tobacco use -Still requiring 2.5 L oxygen via nasal cannula.  Does not wear oxygen at home.  Wean off as able. -Still significantly wheezing.  Not ready to be discharged home today.  Continue Solu-Medrol 80 mg IV every 12 hours. -Continue nebs.  Add Dulera. -Counseled regarding cessation of tobacco use  Anxiety -Patient feels very anxious and is asking for medications.  Will use low-dose Xanax 3 times daily as needed.  Hypokalemia -Improved.  Normocytic anemia -Questionable cause.  Hemoglobin stable.  Monitor intermittently  Hyperlipidemia -Continue statin  DVT prophylaxis: Lovenox Code Status: Full Family Communication: None at bedside Disposition Plan: Status is: Observation The patient will require care spanning > 2 midnights and should be moved to inpatient because: Of severity of illness    Consultants: None  Procedures: None  Antimicrobials: Zithromax   Subjective: Patient seen and examined at bedside.  Feels very anxious..  Extremely short of breath and does not feel ready to go home.  Still intermittently coughing.  No fever, chest pain or vomiting reported.  Objective: Vitals:   05/22/23 0258 05/22/23 0636 05/22/23 0927 05/22/23 0932  BP: 129/78     Pulse: (!) 55   77  Resp: 14   (!) 22  Temp: 98.1 F (36.7 C)     TempSrc: Oral     SpO2: 100% 98% 97% 96%  Weight:       Height:        Intake/Output Summary (Last 24 hours) at 05/22/2023 0955 Last data filed at 05/22/2023 0400 Gross per 24 hour  Intake 990.01 ml  Output 200 ml  Net 790.01 ml   Filed Weights   05/21/23 0911  Weight: 42.2 kg    Examination:  General exam: Appears calm and comfortable.  On 2.5 L oxygen by nasal cannula.  Looks chronically ill and deconditioned.  Very thinly built. Respiratory system: Bilateral decreased breath sounds at bases with scattered crackles and wheezing Cardiovascular system: S1 & S2 heard, Rate controlled Gastrointestinal system: Abdomen is nondistended, soft and nontender. Normal bowel sounds heard. Extremities: No cyanosis, clubbing, edema  Central nervous system: Alert and oriented. No focal neurological deficits. Moving extremities Skin: No rashes, lesions or ulcers Psychiatry: Looks anxious intermittently.  Not agitated.   Data Reviewed: I have personally reviewed following labs and imaging studies  CBC: Recent Labs  Lab 05/21/23 0939 05/22/23 0409  WBC 6.6 5.0  NEUTROABS 3.8  --   HGB 11.4* 10.1*  HCT 36.2 33.6*  MCV 95.0 98.5  PLT 276 257   Basic Metabolic Panel: Recent Labs  Lab 05/21/23 0939 05/22/23 0409  NA 135 139  K 2.2* 4.1  CL 92* 101  CO2 33* 33*  GLUCOSE 125* 99  BUN 13 16  CREATININE 0.69 0.57  CALCIUM 8.5* 8.5*  MG 2.1  --    GFR: Estimated Creatinine Clearance: 44.2 mL/min (by C-G formula based on SCr of 0.57 mg/dL). Liver  Function Tests: Recent Labs  Lab 05/21/23 0939  AST 22  ALT 12  ALKPHOS 45  BILITOT 0.6  PROT 6.8  ALBUMIN 4.0   No results for input(s): "LIPASE", "AMYLASE" in the last 168 hours. No results for input(s): "AMMONIA" in the last 168 hours. Coagulation Profile: No results for input(s): "INR", "PROTIME" in the last 168 hours. Cardiac Enzymes: No results for input(s): "CKTOTAL", "CKMB", "CKMBINDEX", "TROPONINI" in the last 168 hours. BNP (last 3 results) No results for input(s):  "PROBNP" in the last 8760 hours. HbA1C: No results for input(s): "HGBA1C" in the last 72 hours. CBG: No results for input(s): "GLUCAP" in the last 168 hours. Lipid Profile: No results for input(s): "CHOL", "HDL", "LDLCALC", "TRIG", "CHOLHDL", "LDLDIRECT" in the last 72 hours. Thyroid Function Tests: No results for input(s): "TSH", "T4TOTAL", "FREET4", "T3FREE", "THYROIDAB" in the last 72 hours. Anemia Panel: No results for input(s): "VITAMINB12", "FOLATE", "FERRITIN", "TIBC", "IRON", "RETICCTPCT" in the last 72 hours. Sepsis Labs: No results for input(s): "PROCALCITON", "LATICACIDVEN" in the last 168 hours.  Recent Results (from the past 240 hours)  Resp panel by RT-PCR (RSV, Flu A&B, Covid) Anterior Nasal Swab     Status: Abnormal   Collection Time: 05/21/23  9:35 AM   Specimen: Anterior Nasal Swab  Result Value Ref Range Status   SARS Coronavirus 2 by RT PCR NEGATIVE NEGATIVE Final    Comment: (NOTE) SARS-CoV-2 target nucleic acids are NOT DETECTED.  The SARS-CoV-2 RNA is generally detectable in upper respiratory specimens during the acute phase of infection. The lowest concentration of SARS-CoV-2 viral copies this assay can detect is 138 copies/mL. A negative result does not preclude SARS-Cov-2 infection and should not be used as the sole basis for treatment or other patient management decisions. A negative result may occur with  improper specimen collection/handling, submission of specimen other than nasopharyngeal swab, presence of viral mutation(s) within the areas targeted by this assay, and inadequate number of viral copies(<138 copies/mL). A negative result must be combined with clinical observations, patient history, and epidemiological information. The expected result is Negative.  Fact Sheet for Patients:  BloggerCourse.com  Fact Sheet for Healthcare Providers:  SeriousBroker.it  This test is no t yet approved or  cleared by the [place] FDA and  has been authorized for detection and/or diagnosis of SARS-CoV-2 by FDA under an Emergency Use Authorization (EUA). This EUA will remain  in effect (meaning this test can be used) for the duration of the COVID-19 declaration under Section 564(b)(1) of the Act, 21 U.S.C.section 360bbb-3(b)(1), unless the authorization is terminated  or revoked sooner.       Influenza A by PCR NEGATIVE NEGATIVE Final   Influenza B by PCR NEGATIVE NEGATIVE Final    Comment: (NOTE) The Xpert Xpress SARS-CoV-2/FLU/RSV plus assay is intended as an aid in the diagnosis of influenza from Nasopharyngeal swab specimens and should not be used as a sole basis for treatment. Nasal washings and aspirates are unacceptable for Xpert Xpress SARS-CoV-2/FLU/RSV testing.  Fact Sheet for Patients: BloggerCourse.com  Fact Sheet for Healthcare Providers: SeriousBroker.it  This test is not yet approved or cleared by the [place] FDA and has been authorized for detection and/or diagnosis of SARS-CoV-2 by FDA under an Emergency Use Authorization (EUA). This EUA will remain in effect (meaning this test can be used) for the duration of the COVID-19 declaration under Section 564(b)(1) of the Act, 21 U.S.C. section 360bbb-3(b)(1), unless the authorization is terminated or revoked.     Resp Syncytial  Virus by PCR POSITIVE (A) NEGATIVE Final    Comment: (NOTE) Fact Sheet for Patients: BloggerCourse.com  Fact Sheet for Healthcare Providers: SeriousBroker.it  This test is not yet approved or cleared by the [place] FDA and has been authorized for detection and/or diagnosis of SARS-CoV-2 by FDA under an Emergency Use Authorization (EUA). This EUA will remain in effect (meaning this test can be used) for the duration of the COVID-19 declaration under Section 564(b)(1) of the Act,  21 U.S.C. section 360bbb-3(b)(1), unless the authorization is terminated or revoked.  Performed at [facility], [address], [place], [place] [address]          Radiology Studies: [facility] Chest Port 1 View Result Date: 05/21/2023 CLINICAL DATA:  Dyspnea. EXAM: PORTABLE CHEST 1 VIEW COMPARISON:  March 29, 2023. FINDINGS: The heart size and mediastinal contours are within normal limits. Both lungs are clear. The visualized skeletal structures are unremarkable. IMPRESSION: No active disease. Electronically Signed   By: Lupita Raider M.D.   On: 05/21/2023 09:39        Scheduled Meds:  atorvastatin  10 mg Oral Daily   azithromycin  500 mg Oral Daily   enoxaparin (LOVENOX) injection  30 mg Subcutaneous Daily   feeding supplement  237 mL Oral BID BM   ipratropium-albuterol  3 mL Nebulization Q6H   methylPREDNISolone (SOLU-MEDROL) injection  80 mg Intravenous Daily   mometasone-formoterol  2 puff Inhalation BID   potassium chloride SA  20 mEq Oral Daily   Continuous Infusions:        [name], MD [facility] 05/22/2023, 9:55 AM

## 2023-05-22 NOTE — Progress Notes (Signed)
   05/22/23 0230  BiPAP/CPAP/SIPAP  BiPAP/CPAP/SIPAP Pt Type Adult  BiPAP/CPAP/SIPAP V60  Mask Type Full face mask  Mask Size Medium  Set Rate (S)  16 breaths/min (Per pts. current rate)  Respiratory Rate 18 breaths/min (Set 18-20)  IPAP (S)  16 cmH20 (>'d to achieve better Vt's >=434ml)  EPAP (S)  8 cmH2O (<'d to help achieve better oxygen)  FiO2 (%) 30 %  Minute Ventilation 5.5  Leak 8  Peak Inspiratory Pressure (PIP) 16  Tidal Volume (Vt) 372  Patient Home Equipment No  Auto Titrate No  Press High Alarm 30 cmH2O  Press Low Alarm 5 cmH2O  Nasal massage performed Yes (Placed back on BiPAP due to >'d WOB after using BSC, PRN nebulizer given, RN aaware at bedside, Pt. tolerating well.)

## 2023-05-22 NOTE — Progress Notes (Signed)
Initial Nutrition Assessment  DOCUMENTATION CODES:   Underweight  INTERVENTION:  Continue with current Reg diet  Continue to encourage po intake Continue Ensure Plus High Protein po BID, each supplement provides 350 kcal and 20 grams of protein. Provide daily therapeutic multivitamin/minerals RD follow when available for NFPE   NUTRITION DIAGNOSIS:   Increased nutrient needs related to chronic illness as evidenced by estimated needs.    GOAL:   Patient will meet greater than or equal to 90% of their needs    MONITOR:   PO intake, Supplement acceptance  REASON FOR ASSESSMENT:   Consult Assessment of nutrition requirement/status  ASSESSMENT:   67y.o. F, presented form home via EMS related to SOB. Admitted with acute exacerbation of COPD in setting of RSV. PMH; HTN, arthritis, asthma, SOB, sciatica, chest pain. Attempted contact with pt with no answer. Reached out to RN with information about oral intake. Pt appears to have 5% weight loss since November. Unable to obtain nutritional history.  Suspect pt at risk  for malnutrition due to possible weight loss although unable to determine if pt meets criteria due to gaps in meal documentation, in ability to perform NFPE.   Admit weight: 42.2 kg Weight history: 05/21/23 42.2 kg  03/29/23 44.5 kg  12/28/22 45.4 kg  12/13/22 44.5 kg  11/05/22 43.3 kg  10/02/22 44.5 kg    Average Meal Intake: No current documentation  Nutritionally Relevant Medications: Scheduled Meds:  atorvastatin  10 mg Oral Daily   azithromycin  500 mg Oral Daily   feeding supplement  237 mL Oral BID BM   potassium chloride SA  20 mEq Oral Daily    Labs Reviewed    NUTRITION - FOCUSED PHYSICAL EXAM:    Diet Order:   Diet Order             Diet regular Room service appropriate? Yes; Fluid consistency: Thin  Diet effective now                   EDUCATION NEEDS:   Not appropriate for education at this time  Skin:  Skin  Assessment: Reviewed RN Assessment  Last BM:  PTA  Height:   Ht Readings from Last 1 Encounters:  05/21/23 5\' 1"  (1.549 m)    Weight:   Wt Readings from Last 1 Encounters:  05/21/23 42.2 kg    Ideal Body Weight:     BMI:  Body mass index is 17.57 kg/m.  Estimated Nutritional Needs:   Kcal:  1450-1700 kcal  Protein:  65- 72 g  Fluid:  >/= 1.5 L    Jamelle Haring RDN, LDN Clinical Dietitian   If unable to reach, please contact "[facility] Inpatient" secure chat group between 8 am-4 pm daily"

## 2023-05-22 NOTE — Plan of Care (Signed)

## 2023-05-23 DIAGNOSIS — J441 Chronic obstructive pulmonary disease with (acute) exacerbation: Secondary | ICD-10-CM | POA: Diagnosis not present

## 2023-05-23 LAB — BASIC METABOLIC PANEL
Anion gap: 6 (ref 5–15)
BUN: 14 mg/dL (ref 8–23)
CO2: 33 mmol/L — ABNORMAL HIGH (ref 22–32)
Calcium: 9.1 mg/dL (ref 8.9–10.3)
Chloride: 99 mmol/L (ref 98–111)
Creatinine, Ser: 0.45 mg/dL (ref 0.44–1.00)
GFR, Estimated: >60 mL/min (ref >60)
Glucose, Bld: 100 mg/dL — ABNORMAL HIGH (ref 70–99)
Potassium: 4.2 mmol/L (ref 3.5–5.1)
Sodium: 138 mmol/L (ref 135–145)

## 2023-05-23 LAB — MAGNESIUM: Magnesium: 2.2 mg/dL (ref 1.7–2.4)

## 2023-05-23 MED ORDER — ALPRAZOLAM 0.5 MG PO TABS
0.5000 mg | ORAL_TABLET | Freq: Three times a day (TID) | ORAL | Status: DC | PRN
  Administered 2023-05-23 – 2023-05-24 (×3): 0.5 mg via ORAL
  Filled 2023-05-23 (×3): qty 1

## 2023-05-23 MED ORDER — IPRATROPIUM-ALBUTEROL 0.5-2.5 (3) MG/3ML IN SOLN
3.0000 mL | Freq: Three times a day (TID) | RESPIRATORY_TRACT | Status: DC
  Administered 2023-05-24: 3 mL via RESPIRATORY_TRACT
  Filled 2023-05-23: qty 3

## 2023-05-23 MED ORDER — METHYLPREDNISOLONE SODIUM SUCC 125 MG IJ SOLR
80.0000 mg | Freq: Two times a day (BID) | INTRAMUSCULAR | Status: DC
  Administered 2023-05-23 – 2023-05-24 (×2): 80 mg via INTRAVENOUS
  Filled 2023-05-23 (×2): qty 2

## 2023-05-23 NOTE — Plan of Care (Signed)
  Problem: Clinical Measurements: Goal: Diagnostic test results will improve Outcome: Progressing Goal: Respiratory complications will improve Outcome: Progressing Goal: Cardiovascular complication will be avoided Outcome: Progressing   Problem: Coping: Goal: Level of anxiety will decrease Outcome: Progressing   Problem: Pain Management: Goal: General experience of comfort will improve Outcome: Progressing   Problem: Safety: Goal: Ability to remain free from injury will improve Outcome: Progressing

## 2023-05-23 NOTE — Progress Notes (Signed)
Pt's family member at the desk, stating patient was in the bathroom because she wanted to wash up and use the bathroom. Primary RN contacted while this RN went to room- pt is to use BSC and remain on O2. Pt directed back to bed by this RN and placed back on O2 at 2 L [place]. Initial O2 sats were 86%, increased to 94% after 3 minutes. Pt & adult grandchildren verbalized an understanding that she needed to remain on O2 and use BSC. PIV noted to be bent & 3/4 out of site- removed by this RN, primary RN updated by this RN

## 2023-05-23 NOTE — Progress Notes (Signed)
PROGRESS NOTE    [name]  AYT:[id number] DOB: [birth year] DOA: 05/21/2023 PCP: Karenann Cai, NP   Brief Narrative:  67 y.o. female with medical history significant for hypertension, COPD and chronic back pain presented with worsening shortness of breath.  She initially required BiPAP on presentation but was subsequently switched to nasal cannula oxygen.  She tested positive for RSV.  Chest x-ray showed no active disease.  She was started on IV steroids along with nebs.  Assessment & Plan:   Acute respiratory failure with hypoxia COPD exacerbation due to RSV infection Tobacco use -Still requiring 2.5 L oxygen via nasal cannula.  Does not wear oxygen at home.  Wean off as able. -Continue Solu-Medrol. -Continue nebs and Dulera. -Counseled regarding cessation of tobacco use  Anxiety -Continue low-dose Xanax 3 times daily as needed.  Hypokalemia -Improved.  Normocytic anemia -Questionable cause.  Hemoglobin stable.  Monitor intermittently  Hyperlipidemia -Continue statin  DVT prophylaxis: Lovenox Code Status: Full Family Communication: None at bedside Disposition Plan: Status is: inpatient because: Of severity of illness    Consultants: None  Procedures: None  Antimicrobials: Zithromax   Subjective: Patient seen and examined at bedside.  Feels slightly better.  Still short of breath with exertion.   No fever, vomiting, chest pain or abdominal pain reported Objective: Vitals:   05/22/23 1411 05/22/23 1413 05/22/23 2141 05/23/23 0531  BP: 124/68  124/83 122/73  Pulse: 68  68 (!) 58  Resp:   18 16  Temp: 99.5 F (37.5 C)  97.6 F (36.4 C) 98.6 F (37 C)  TempSrc: Oral  Oral Oral  SpO2: 99% 95% 100% 100%  Weight:      Height:        Intake/Output Summary (Last 24 hours) at 05/23/2023 0741 Last data filed at 05/23/2023 0330 Gross per 24 hour  Intake 120 ml  Output 450 ml  Net -330 ml   Filed Weights   05/21/23 0911  Weight: 42.2 kg     Examination:  General: Currently on 2.5 L oxygen via nasal cannula.  No distress.  Extremely thinly built.  Chronically ill and deconditioned looking. ENT/neck: No thyromegaly.  JVD is not elevated  respiratory: Decreased breath sounds at bases bilaterally with some crackles and wheezing  CVS: S1-S2 heard, rate controlled currently Abdominal: Soft, nontender, slightly distended; no organomegaly, bowel sounds are heard Extremities: Trace lower extremity edema; no cyanosis  CNS: Awake and alert.  No focal neurologic deficit.  Moves extremities Lymph: No obvious lymphadenopathy Skin: No obvious ecchymosis/lesions  psych: Currently not agitated.  Intermittently looks anxious musculoskeletal: No obvious joint swelling/deformity    Data Reviewed: I have personally reviewed following labs and imaging studies  CBC: Recent Labs  Lab 05/21/23 0939 05/22/23 0409  WBC 6.6 5.0  NEUTROABS 3.8  --   HGB 11.4* 10.1*  HCT 36.2 33.6*  MCV 95.0 98.5  PLT 276 257   Basic Metabolic Panel: Recent Labs  Lab 05/21/23 0939 05/22/23 0409 05/23/23 0355  NA 135 139 138  K 2.2* 4.1 4.2  CL 92* 101 99  CO2 33* 33* 33*  GLUCOSE 125* 99 100*  BUN 13 16 14   CREATININE 0.69 0.57 0.45  CALCIUM 8.5* 8.5* 9.1  MG 2.1  --  2.2   GFR: Estimated Creatinine Clearance: 44.2 mL/min (by C-G formula based on SCr of 0.45 mg/dL). Liver Function Tests: Recent Labs  Lab 05/21/23 0939  AST 22  ALT 12  ALKPHOS 45  BILITOT 0.6  PROT 6.8  ALBUMIN 4.0   No results for input(s): "LIPASE", "AMYLASE" in the last 168 hours. No results for input(s): "AMMONIA" in the last 168 hours. Coagulation Profile: No results for input(s): "INR", "PROTIME" in the last 168 hours. Cardiac Enzymes: No results for input(s): "CKTOTAL", "CKMB", "CKMBINDEX", "TROPONINI" in the last 168 hours. BNP (last 3 results) No results for input(s): "PROBNP" in the last 8760 hours. HbA1C: No results for input(s): "HGBA1C" in the  last 72 hours. CBG: No results for input(s): "GLUCAP" in the last 168 hours. Lipid Profile: No results for input(s): "CHOL", "HDL", "LDLCALC", "TRIG", "CHOLHDL", "LDLDIRECT" in the last 72 hours. Thyroid Function Tests: No results for input(s): "TSH", "T4TOTAL", "FREET4", "T3FREE", "THYROIDAB" in the last 72 hours. Anemia Panel: No results for input(s): "VITAMINB12", "FOLATE", "FERRITIN", "TIBC", "IRON", "RETICCTPCT" in the last 72 hours. Sepsis Labs: No results for input(s): "PROCALCITON", "LATICACIDVEN" in the last 168 hours.  Recent Results (from the past 240 hours)  Resp panel by RT-PCR (RSV, Flu A&B, Covid) Anterior Nasal Swab     Status: Abnormal   Collection Time: 05/21/23  9:35 AM   Specimen: Anterior Nasal Swab  Result Value Ref Range Status   SARS Coronavirus 2 by RT PCR NEGATIVE NEGATIVE Final    Comment: (NOTE) SARS-CoV-2 target nucleic acids are NOT DETECTED.  The SARS-CoV-2 RNA is generally detectable in upper respiratory specimens during the acute phase of infection. The lowest concentration of SARS-CoV-2 viral copies this assay can detect is 138 copies/mL. A negative result does not preclude SARS-Cov-2 infection and should not be used as the sole basis for treatment or other patient management decisions. A negative result may occur with  improper specimen collection/handling, submission of specimen other than nasopharyngeal swab, presence of viral mutation(s) within the areas targeted by this assay, and inadequate number of viral copies(<138 copies/mL). A negative result must be combined with clinical observations, patient history, and epidemiological information. The expected result is Negative.  Fact Sheet for Patients:  BloggerCourse.com  Fact Sheet for Healthcare Providers:  SeriousBroker.it  This test is no t yet approved or cleared by the [place] FDA and  has been authorized for detection and/or  diagnosis of SARS-CoV-2 by FDA under an Emergency Use Authorization (EUA). This EUA will remain  in effect (meaning this test can be used) for the duration of the COVID-19 declaration under Section 564(b)(1) of the Act, 21 U.S.C.section 360bbb-3(b)(1), unless the authorization is terminated  or revoked sooner.       Influenza A by PCR NEGATIVE NEGATIVE Final   Influenza B by PCR NEGATIVE NEGATIVE Final    Comment: (NOTE) The Xpert Xpress SARS-CoV-2/FLU/RSV plus assay is intended as an aid in the diagnosis of influenza from Nasopharyngeal swab specimens and should not be used as a sole basis for treatment. Nasal washings and aspirates are unacceptable for Xpert Xpress SARS-CoV-2/FLU/RSV testing.  Fact Sheet for Patients: BloggerCourse.com  Fact Sheet for Healthcare Providers: SeriousBroker.it  This test is not yet approved or cleared by the [place] FDA and has been authorized for detection and/or diagnosis of SARS-CoV-2 by FDA under an Emergency Use Authorization (EUA). This EUA will remain in effect (meaning this test can be used) for the duration of the COVID-19 declaration under Section 564(b)(1) of the Act, 21 U.S.C. section 360bbb-3(b)(1), unless the authorization is terminated or revoked.     Resp Syncytial Virus by PCR POSITIVE (A) NEGATIVE Final    Comment: (NOTE) Fact Sheet for Patients: BloggerCourse.com  Fact Sheet for  Healthcare Providers: SeriousBroker.it  This test is not yet approved or cleared by the [place] and has been authorized for detection and/or diagnosis of SARS-CoV-2 by FDA under an Emergency Use Authorization (EUA). This EUA will remain in effect (meaning this test can be used) for the duration of the COVID-19 declaration under Section 564(b)(1) of the Act, 21 U.S.C. section 360bbb-3(b)(1), unless the authorization is terminated  or revoked.  Performed at [facility], [address] [place], [place] [address]          Radiology Studies: [facility] Chest Port 1 View Result Date: 05/21/2023 CLINICAL DATA:  Dyspnea. EXAM: PORTABLE CHEST 1 VIEW COMPARISON:  March 29, 2023. FINDINGS: The heart size and mediastinal contours are within normal limits. Both lungs are clear. The visualized skeletal structures are unremarkable. IMPRESSION: No active disease. Electronically Signed   By: Lupita Raider M.D.   On: 05/21/2023 09:39        Scheduled Meds:  atorvastatin  10 mg Oral Daily   azithromycin  500 mg Oral Daily   enoxaparin (LOVENOX) injection  30 mg Subcutaneous Daily   feeding supplement  237 mL Oral BID BM   ipratropium-albuterol  3 mL Nebulization Q6H   methylPREDNISolone (SOLU-MEDROL) injection  80 mg Intravenous Daily   mometasone-formoterol  2 puff Inhalation BID   multivitamin with minerals  1 tablet Oral Daily   potassium chloride SA  20 mEq Oral Daily   Continuous Infusions:        [name], MD [facility] 05/23/2023, 7:41 AM

## 2023-05-24 ENCOUNTER — Other Ambulatory Visit: Payer: Self-pay

## 2023-05-24 ENCOUNTER — Emergency Department (HOSPITAL_COMMUNITY): Payer: Medicare HMO

## 2023-05-24 ENCOUNTER — Inpatient Hospital Stay (HOSPITAL_COMMUNITY)
Admission: EM | Admit: 2023-05-24 | Discharge: 2023-05-27 | DRG: 190 | Disposition: A | Discharge status: Home Health | Payer: Medicare HMO | Attending: Internal Medicine | Admitting: Internal Medicine

## 2023-05-24 ENCOUNTER — Encounter (HOSPITAL_COMMUNITY): Payer: Self-pay | Admitting: Internal Medicine

## 2023-05-24 DIAGNOSIS — F419 Anxiety disorder, unspecified: Secondary | ICD-10-CM | POA: Diagnosis present

## 2023-05-24 DIAGNOSIS — Z801 Family history of malignant neoplasm of trachea, bronchus and lung: Secondary | ICD-10-CM

## 2023-05-24 DIAGNOSIS — Z808 Family history of malignant neoplasm of other organs or systems: Secondary | ICD-10-CM

## 2023-05-24 DIAGNOSIS — D649 Anemia, unspecified: Secondary | ICD-10-CM | POA: Diagnosis present

## 2023-05-24 DIAGNOSIS — J9621 Acute and chronic respiratory failure with hypoxia: Secondary | ICD-10-CM | POA: Diagnosis present

## 2023-05-24 DIAGNOSIS — J441 Chronic obstructive pulmonary disease with (acute) exacerbation: Secondary | ICD-10-CM | POA: Diagnosis not present

## 2023-05-24 DIAGNOSIS — Z9181 History of falling: Secondary | ICD-10-CM

## 2023-05-24 DIAGNOSIS — E8729 Other acidosis: Secondary | ICD-10-CM | POA: Diagnosis present

## 2023-05-24 DIAGNOSIS — E785 Hyperlipidemia, unspecified: Secondary | ICD-10-CM | POA: Diagnosis present

## 2023-05-24 DIAGNOSIS — J9622 Acute and chronic respiratory failure with hypercapnia: Secondary | ICD-10-CM | POA: Diagnosis present

## 2023-05-24 DIAGNOSIS — G8929 Other chronic pain: Secondary | ICD-10-CM | POA: Diagnosis present

## 2023-05-24 DIAGNOSIS — X088XXA Exposure to other specified smoke, fire and flames, initial encounter: Secondary | ICD-10-CM | POA: Diagnosis present

## 2023-05-24 DIAGNOSIS — R636 Underweight: Secondary | ICD-10-CM | POA: Diagnosis present

## 2023-05-24 DIAGNOSIS — Z90710 Acquired absence of both cervix and uterus: Secondary | ICD-10-CM

## 2023-05-24 DIAGNOSIS — B974 Respiratory syncytial virus as the cause of diseases classified elsewhere: Secondary | ICD-10-CM | POA: Diagnosis present

## 2023-05-24 DIAGNOSIS — F17210 Nicotine dependence, cigarettes, uncomplicated: Secondary | ICD-10-CM | POA: Diagnosis present

## 2023-05-24 DIAGNOSIS — Z681 Body mass index (BMI) 19 or less, adult: Secondary | ICD-10-CM

## 2023-05-24 DIAGNOSIS — J45901 Unspecified asthma with (acute) exacerbation: Secondary | ICD-10-CM | POA: Diagnosis present

## 2023-05-24 DIAGNOSIS — Z8249 Family history of ischemic heart disease and other diseases of the circulatory system: Secondary | ICD-10-CM

## 2023-05-24 DIAGNOSIS — I10 Essential (primary) hypertension: Secondary | ICD-10-CM | POA: Diagnosis present

## 2023-05-24 DIAGNOSIS — Z7951 Long term (current) use of inhaled steroids: Secondary | ICD-10-CM

## 2023-05-24 DIAGNOSIS — M549 Dorsalgia, unspecified: Secondary | ICD-10-CM | POA: Diagnosis present

## 2023-05-24 DIAGNOSIS — J432 Centrilobular emphysema: Secondary | ICD-10-CM | POA: Diagnosis present

## 2023-05-24 DIAGNOSIS — Z79899 Other long term (current) drug therapy: Secondary | ICD-10-CM

## 2023-05-24 DIAGNOSIS — Z79891 Long term (current) use of opiate analgesic: Secondary | ICD-10-CM

## 2023-05-24 DIAGNOSIS — R062 Wheezing: Secondary | ICD-10-CM | POA: Diagnosis not present

## 2023-05-24 DIAGNOSIS — Z96642 Presence of left artificial hip joint: Secondary | ICD-10-CM | POA: Diagnosis present

## 2023-05-24 DIAGNOSIS — Z833 Family history of diabetes mellitus: Secondary | ICD-10-CM

## 2023-05-24 LAB — BASIC METABOLIC PANEL
Anion gap: 9 (ref 5–15)
BUN: 16 mg/dL (ref 8–23)
CO2: 31 mmol/L (ref 22–32)
Calcium: 9.5 mg/dL (ref 8.9–10.3)
Chloride: 96 mmol/L — ABNORMAL LOW (ref 98–111)
Creatinine, Ser: 0.33 mg/dL — ABNORMAL LOW (ref 0.44–1.00)
GFR, Estimated: >60 mL/min (ref >60)
Glucose, Bld: 146 mg/dL — ABNORMAL HIGH (ref 70–99)
Potassium: 4.7 mmol/L (ref 3.5–5.1)
Sodium: 136 mmol/L (ref 135–145)

## 2023-05-24 LAB — BLOOD GAS, VENOUS
Acid-Base Excess: 10.6 mmol/L — ABNORMAL HIGH (ref 0.0–2.0)
Bicarbonate: 39 mmol/L — ABNORMAL HIGH (ref 20.0–28.0)
O2 Saturation: 90.5 %
Patient temperature: 37
pCO2, Ven: 69 mm[Hg] — ABNORMAL HIGH (ref 44–60)
pH, Ven: 7.36 (ref 7.25–7.43)
pO2, Ven: 58 mm[Hg] — ABNORMAL HIGH (ref 32–45)

## 2023-05-24 LAB — CBC
HCT: 38.4 % (ref 36.0–46.0)
Hemoglobin: 11.8 g/dL — ABNORMAL LOW (ref 12.0–15.0)
MCH: 29.9 pg (ref 26.0–34.0)
MCHC: 30.7 g/dL (ref 30.0–36.0)
MCV: 97.2 fL (ref 80.0–100.0)
Platelets: 313 10*3/uL (ref 150–400)
RBC: 3.95 MIL/uL (ref 3.87–5.11)
RDW: 14.1 % (ref 11.5–15.5)
WBC: 6 10*3/uL (ref 4.0–10.5)
nRBC: 0 % (ref 0.0–0.2)

## 2023-05-24 MED ORDER — SODIUM CHLORIDE 0.9 % IV SOLN
500.0000 mg | Freq: Once | INTRAVENOUS | Status: AC
  Administered 2023-05-24: 500 mg via INTRAVENOUS
  Filled 2023-05-24: qty 5

## 2023-05-24 MED ORDER — IPRATROPIUM-ALBUTEROL 0.5-2.5 (3) MG/3ML IN SOLN
3.0000 mL | Freq: Once | RESPIRATORY_TRACT | Status: AC
  Administered 2023-05-24: 3 mL via RESPIRATORY_TRACT
  Filled 2023-05-24: qty 3

## 2023-05-24 MED ORDER — ENOXAPARIN SODIUM 30 MG/0.3ML IJ SOSY
30.0000 mg | PREFILLED_SYRINGE | INTRAMUSCULAR | Status: DC
  Administered 2023-05-24 – 2023-05-26 (×3): 30 mg via SUBCUTANEOUS
  Filled 2023-05-24 (×3): qty 0.3

## 2023-05-24 MED ORDER — MAGNESIUM SULFATE 2 GM/50ML IV SOLN
2.0000 g | Freq: Once | INTRAVENOUS | Status: AC
  Administered 2023-05-24: 2 g via INTRAVENOUS
  Filled 2023-05-24: qty 50

## 2023-05-24 MED ORDER — HYDROCHLOROTHIAZIDE 12.5 MG PO TABS
25.0000 mg | ORAL_TABLET | Freq: Once | ORAL | Status: AC
  Administered 2023-05-24: 25 mg via ORAL
  Filled 2023-05-24: qty 2

## 2023-05-24 MED ORDER — ORAL CARE MOUTH RINSE: 15.0000 mL | OROMUCOSAL | Status: DC | PRN

## 2023-05-24 MED ORDER — METHYLPREDNISOLONE SODIUM SUCC 40 MG IJ SOLR
40.0000 mg | Freq: Every day | INTRAMUSCULAR | Status: DC
  Administered 2023-05-24 – 2023-05-27 (×4): 40 mg via INTRAVENOUS
  Filled 2023-05-24 (×4): qty 1

## 2023-05-24 MED ORDER — CHLORHEXIDINE GLUCONATE CLOTH 2 % EX PADS
6.0000 | MEDICATED_PAD | Freq: Every day | CUTANEOUS | Status: DC
  Administered 2023-05-25 – 2023-05-26 (×3): 6 via TOPICAL

## 2023-05-24 MED ORDER — PROCHLORPERAZINE EDISYLATE 10 MG/2ML IJ SOLN: 5.0000 mg | Freq: Four times a day (QID) | INTRAMUSCULAR | Status: DC | PRN

## 2023-05-24 MED ORDER — PREDNISONE 20 MG PO TABS: 40.0000 mg | ORAL_TABLET | Freq: Every day | ORAL | 0 refills | Status: DC

## 2023-05-24 MED ORDER — MORPHINE SULFATE (PF) 2 MG/ML IV SOLN
2.0000 mg | INTRAVENOUS | Status: AC
  Administered 2023-05-24: 2 mg via INTRAVENOUS
  Filled 2023-05-24: qty 1

## 2023-05-24 MED ORDER — MOMETASONE FURO-FORMOTEROL FUM 100-5 MCG/ACT IN AERO: 2.0000 | INHALATION_SPRAY | Freq: Two times a day (BID) | RESPIRATORY_TRACT | 0 refills | Status: DC

## 2023-05-24 MED ORDER — ENALAPRIL MALEATE 10 MG PO TABS
10.0000 mg | ORAL_TABLET | Freq: Once | ORAL | Status: AC
  Administered 2023-05-24: 10 mg via ORAL
  Filled 2023-05-24: qty 1

## 2023-05-24 MED ORDER — SODIUM CHLORIDE 0.9 % IV SOLN
1.0000 g | Freq: Once | INTRAVENOUS | Status: AC
  Administered 2023-05-24: 1 g via INTRAVENOUS
  Filled 2023-05-24: qty 10

## 2023-05-24 MED ORDER — HYDRALAZINE HCL 20 MG/ML IJ SOLN: 5.0000 mg | Freq: Four times a day (QID) | INTRAMUSCULAR | Status: DC | PRN

## 2023-05-24 MED ORDER — ORAL CARE MOUTH RINSE
15.0000 mL | OROMUCOSAL | Status: DC
  Administered 2023-05-25 – 2023-05-27 (×8): 15 mL via OROMUCOSAL

## 2023-05-24 MED ORDER — MORPHINE SULFATE (PF) 2 MG/ML IV SOLN
2.0000 mg | INTRAVENOUS | Status: DC | PRN
  Administered 2023-05-24 – 2023-05-25 (×3): 2 mg via INTRAVENOUS
  Filled 2023-05-24 (×3): qty 1

## 2023-05-24 MED ORDER — IPRATROPIUM-ALBUTEROL 0.5-2.5 (3) MG/3ML IN SOLN
3.0000 mL | RESPIRATORY_TRACT | Status: DC
  Administered 2023-05-24 – 2023-05-27 (×15): 3 mL via RESPIRATORY_TRACT
  Filled 2023-05-24 (×15): qty 3

## 2023-05-24 NOTE — Progress Notes (Signed)
Discharge instructions reviewed with pt and granddaughter, Jon Gills. Follow up appts discussed. Home O2 at bedside and in place at 2L at time of discharge. Pt stable at this time and discharged to home with family.

## 2023-05-24 NOTE — Progress Notes (Signed)
  Patient Saturations on Room Air at Rest = 97%  Patient Saturations on [employer] while Ambulating = 89%  Patient Saturations on 2 Liters of oxygen while Ambulating = 96%

## 2023-05-24 NOTE — ED Notes (Signed)
Report given to Russell, RN.  

## 2023-05-24 NOTE — H&P (Incomplete)
History and Physical  [name]:[id number] DOB: [birth year] DOA: 05/24/2023  Referring physician: Dr. Rhae Hammock, EDP  PCP: Karenann Cai, NP  Outpatient Specialists: Pulmonary Patient coming from: Home.  Chief Complaint: Wheezing and shortness of breath.  HPI: [name] is a 67 y.o. female with medical history significant for COPD, asthma, centrilobular emphysema followed by pulmonary Dr. Francine Graven, chronic hypoxia on 2 L nasal cannula as needed at baseline, former tobacco user, quit 4 to 5 months ago, hypertension, hyperlipidemia, chronic back pain post fall years ago, discharged this morning after admission for COPD exacerbation, RSV infection, who presents today with complaints of worsening shortness of breath and wheezing, not improved with home inhalers and nebulizer.  Denies use of tobacco however admits to exposure to secondhand smoking.  No reported chest pain or palpitations.  In the ED, afebrile with no leukocytosis.  Repeated chest x-ray revealed hazy left lower lung opacity which may be artifactual related to multiple overlying skinfold.  Otherwise no focal consolidations.  Consequently the patient received Rocephin and azithromycin in the ED.  Additionally she received multiple rounds of breathing treatments, IV magnesium.  Due to increased work of breathing the patient was placed on BiPAP.  TRH, hospitalist service, was asked to admit.  ED Course: Temperature 97.8.  BP 180/102, pulse 99, respiratory 22, O2 saturation 100% on 2 L, was placed on BiPAP due to increased work of breathing.  Lab studies essentially unremarkable except for hemoglobin of 11.8 which is at baseline.  Serum glucose 146, creatinine 0.38 with GFR greater than 60.  Review of Systems: Review of systems as noted in the HPI. All other systems reviewed and are negative.   Past Medical History:  Diagnosis Date   Arthritis    Asthma    Chest pain 2006   Chest pain 10/2016   Chronic back pain     Hypertension    Sciatica    Shortness of breath    due to  smoking    Past Surgical History:  Procedure Laterality Date   ABDOMINAL HYSTERECTOMY  1997   CARDIAC CATHETERIZATION  2006   HEMI-MICRODISCECTOMY LUMBAR LAMINECTOMY LEVEL 1  05/14/2012   Procedure: HEMI-MICRODISCECTOMY LUMBAR LAMINECTOMY LEVEL 1;  Surgeon: Jacki Cones, MD;  Location: [facility];  Service: Orthopedics;  Laterality: N/A;  Hemi Laminectomy Microdiscectomy L4 - L5 Central (X-Ray)   ingrown toenail removal     TONSILLECTOMY     TOTAL HIP ARTHROPLASTY Left 11/05/2013   Procedure: LEFT TOTAL HIP ARTHROPLASTY;  Surgeon: Jacki Cones, MD;  Location: [facility];  Service: Orthopedics;  Laterality: Left;    Social History:  reports that she has been smoking cigarettes. She has a 3 pack-year smoking history. She has never used smokeless tobacco. She reports that she does not drink alcohol and does not use drugs.   No Known Allergies  Family History  Problem Relation Age of Onset   Heart attack Mother 29   Hypertension Sister    Diabetes type II Sister    Tongue cancer Brother    Heart attack Sister 75   Lung cancer Sister    Hypertension Sister       Prior to Admission medications   Medication Sig Start Date End Date Taking? Authorizing Provider  albuterol (PROVENTIL) (2.5 MG/3ML) 0.083% nebulizer solution Take 3 mLs (2.5 mg total) by nebulization every 6 (six) hours as needed for wheezing or shortness of breath. 06/10/21   [name], PA-C  albuterol (VENTOLIN HFA)  108 (90 Base) MCG/ACT inhaler Inhale 2 puffs into the lungs every 4 (four) hours as needed for wheezing or shortness of breath. 10/11/22   [name], [name], DO  atorvastatin (LIPITOR) 10 MG tablet Take 10 mg by mouth daily. 03/20/23   [provider]  enalapril-hydrochlorothiazide (VASERETIC) 10-25 MG tablet Take 1 tablet by mouth daily. 05/06/23   [provider]  ibuprofen (ADVIL) 800 MG tablet Take 800 mg by mouth every 6  (six) hours as needed for mild pain (pain score 1-3) or moderate pain (pain score 4-6). 05/06/23   [provider]  mometasone-formoterol (DULERA) 100-5 MCG/ACT AERO Inhale 2 puffs into the lungs 2 (two) times daily. 05/24/23   [name], MD  Multiple Vitamin (MULTIVITAMIN) capsule Take 1 capsule by mouth daily.    [provider]  oxyCODONE-acetaminophen (PERCOCET) 10-325 MG tablet Take 1 tablet by mouth every 4 (four) hours as needed for pain. 12/23/20   [provider]  potassium chloride SA (KLOR-CON M) 20 MEQ tablet Take 1 tablet (20 mEq total) by mouth daily. 03/29/23   [name], MD  predniSONE (DELTASONE) 20 MG tablet Take 2 tablets (40 mg total) by mouth daily with breakfast for 7 days. 05/25/23 06/01/23  [name], MD  zolpidem (AMBIEN) 5 MG tablet Take 5 mg by mouth at bedtime. 12/25/20   [provider]    Physical Exam: BP (!) 145/103   Pulse 86   Resp 14   Ht 5\' 1"  (1.549 m)   Wt 42.2 kg   SpO2 94%   BMI 17.58 kg/m   General: 67 y.o. year-old female well developed well nourished in no acute distress.  Alert and oriented x3. Cardiovascular: Regular rate and rhythm with no rubs or gallops.  No thyromegaly or JVD noted.  No lower extremity edema. 2/4 pulses in all 4 extremities. Respiratory: On BiPAP.  Diffuse wheezing bilaterally.  No rales noted. Good inspiratory effort. Abdomen: Soft nontender nondistended with normal bowel sounds x4 quadrants. Muskuloskeletal: No cyanosis, clubbing or edema noted bilaterally Neuro: CN II-XII intact, strength, sensation, reflexes Skin: No ulcerative lesions noted or rashes Psychiatry: Judgement and insight appear normal. Mood is appropriate for condition and setting          Labs on Admission:  Basic Metabolic Panel: Recent Labs  Lab 05/21/23 0939 05/22/23 0409 05/23/23 0355 05/24/23 1610  NA 135 139 138 136  K 2.2* 4.1 4.2 4.7  CL 92* 101 99 96*  CO2 33* 33* 33* 31  GLUCOSE 125* 99  100* 146*  BUN 13 16 14 16   CREATININE 0.69 0.57 0.45 0.33*  CALCIUM 8.5* 8.5* 9.1 9.5  MG 2.1  --  2.2  --    Liver Function Tests: Recent Labs  Lab 05/21/23 0939  AST 22  ALT 12  ALKPHOS 45  BILITOT 0.6  PROT 6.8  ALBUMIN 4.0   No results for input(s): "LIPASE", "AMYLASE" in the last 168 hours. No results for input(s): "AMMONIA" in the last 168 hours. CBC: Recent Labs  Lab 05/21/23 0939 05/22/23 0409 05/24/23 1610  WBC 6.6 5.0 6.0  NEUTROABS 3.8  --   --   HGB 11.4* 10.1* 11.8*  HCT 36.2 33.6* 38.4  MCV 95.0 98.5 97.2  PLT 276 257 313   Cardiac Enzymes: No results for input(s): "CKTOTAL", "CKMB", "CKMBINDEX", "TROPONINI" in the last 168 hours.  BNP (last 3 results) Recent Labs    03/29/23 1834 05/21/23 0939  BNP 22.0 62.3  ProBNP (last 3 results) No results for input(s): "PROBNP" in the last 8760 hours.  CBG: No results for input(s): "GLUCAP" in the last 168 hours.  Radiological Exams on Admission: DG Chest Port 1 View Result Date: 05/24/2023 CLINICAL DATA:  Shortness of breath EXAM: PORTABLE CHEST 1 VIEW COMPARISON:  Chest radiograph dated 05/21/2023 FINDINGS: Normal lung volumes. Hazy left lower lung opacity. No pleural effusion or pneumothorax. The heart size and mediastinal contours are within normal limits. No acute osseous abnormality. IMPRESSION: Hazy left lower lung opacity, which may be artifactual related to multiple overlying skin folds. Otherwise, no focal consolidations. Electronically Signed   By: Agustin Cree M.D.   On: 05/24/2023 16:37    EKG: I independently viewed the EKG done and my findings are as followed: Sinus rhythm rate of 89.  Nonspecific ST-T changes.  QTc 394.  Assessment/Plan Present on Admission:  Wheezing  Principal Problem:   Wheezing  Wheezing, shortness of breath, suspect in the setting of acute asthma exacerbation with superimposed COPD, centrilobular emphysema Admits to exposure to secondhand smoking which may have  exacerbated her asthma. Quit smoking 4 to 5 months ago. Works at [employer], states customers smoke outside. Received Rocephin and IV azithromycin in the ED No clear evidence of bacterial pneumonia-no reported increase sputum production Will continue IV azithromycin for its anti-inflammatory properties. Obtain baseline procalcitonin RSV positive on 05/21/2023. DuoNebs every 4 hours, IV Solu-Medrol 40 mg daily, wean off BiPAP as tolerated.  Acute on chronic hypoxic and hypercarbic respiratory failure in the setting of acute asthma exacerbation At baseline on 2 L nasal cannula as needed Diffuse wheezing on exam with concern for acute asthma exacerbation. Compensated respiratory acidosis with hypercarbia, VBG pH 7.36, pCO2 69, serum bicarb 31 on 05/24/2023. Rule out other causes, follow D-dimer, procalcitonin, lactic acid level Obtain CTA chest PE with and without contrast if D-dimer is positive to rule out acute PE. Repeat VBG to ensure improvement of hypercarbia. Currently on BiPAP due to increased work of breathing chronic hypercarbia Not on BiPAP prior to admission, may consider close follow-up with pulmonary, will likely need nightly BiPAP at home for chronic hypercarbia. Continue BiPAP nightly Continue to maintain O2 saturation above 90% Flutter valve when no longer on BiPAP Strict n.p.o. while on BiPAP.  Hypertension BP is not at goal, elevated initially, now normotensive with MAP of 71. Resume home oral antihypertensives when no longer n.p.o. IV hydralazine as needed with parameters  Hyperlipidemia Resume home regimen when no longer n.p.o.  Chronic back pain post back injury after a fall years ago On chronic opiates use Resume home regimen and bowel regimen as needed IV morphine as needed for severe pain while NPO.  Centrilobular emphysema Resume home regimen  Underweight BMI 17 Encourage oral intake, increase oral protein calorie intake when no longer  NPO. Currently strict n.p.o. until off BiPAP.  Chronic normocytic anemia On presentation hemoglobin 11.8. No reported overt bleeding Continue to monitor H&H.    Time: 75 minutes.    DVT prophylaxis: Subcu Lovenox daily.  Code Status: Full code.  Family Communication: None at bedside.  Disposition Plan: Admitted to stepdown unit due to being on BiPAP.  Consults called: None.  Admission status: Observation status.   Status is: Observation    Darlin Drop MD [facility] Pager [phone]  If 7PM-7AM, please contact night-coverage [email or web address] Password [facility]  05/24/2023, 8:07 PM

## 2023-05-24 NOTE — Progress Notes (Signed)
Mobility Specialist - Progress Note   05/24/23 1105  Mobility  Activity Ambulated with assistance in hallway  Level of Assistance Minimal assist, patient does 75% or more  Assistive Device Other (Comment) (HHA)  Distance Ambulated (ft) 50 ft  Range of Motion/Exercises Active  Activity Response Tolerated fair  Mobility Referral Yes  Mobility visit 1 Mobility  Mobility Specialist Start Time (ACUTE ONLY) 0946  Mobility Specialist Stop Time (ACUTE ONLY) 1006  Mobility Specialist Time Calculation (min) (ACUTE ONLY) 20 min   Received in bed and agreed to mobility.  Nurse requested Mobility Specialist to perform oxygen saturation test with pt which includes removing pt from oxygen both at rest and while ambulating.  Below are the results from that testing.     Patient Saturations on Room Air at Rest = spO2 91%  Patient Saturations on Room Air while Ambulating = sp02 88% .  Rested and performed pursed lip breathing for 1 minute with sp02 at 89%.  At end of testing pt left in room on 2  Liters of oxygen.  Reported results to nurse.   During session pt c/o difficulty breathing. Difficult to obtain SpO2% but when obtained saturations were ok.  Pt needed Min A for support to get to wall to bring chair to her. Pt sat down requesting breathing treatment. Wheeled back to room where pt transferred back to bed.  Left in bed with all needs met and on 2L.  Marilynne Halsted Mobility Specialist

## 2023-05-24 NOTE — ED Provider Notes (Signed)
[facility] AT [facility] Provider Note   CSN: [id number] Arrival date & time: 05/24/23  1519     History  Chief Complaint  Patient presents with   Shortness of Breath    [name] is a 67 y.o. female.  67 year old female with past medical history of COPD and questionable asthma as well as hypertension hyperlipidemia presents the emergency department today with shortness of breath.  The patient was just admitted for COPD exacerbation.  She was discharged this morning.  She states that when she got home that she got very short of breath.  She tried an albuterol treatment which did not help.  She called medics and was noted to have increased work of breathing.  She was given additional nebulizer treatment.  She was brought to the ER at that time further evaluation.  The patient denies any chest pain with this.  She is currently receiving a DuoNeb treatment and thinks this may be helping.  She was diagnosed with RSV during her most recent hospitalization as well.   Shortness of Breath Associated symptoms: cough        Home Medications Prior to Admission medications   Medication Sig Start Date End Date Taking? Authorizing Provider  albuterol (PROVENTIL) (2.5 MG/3ML) 0.083% nebulizer solution Take 3 mLs (2.5 mg total) by nebulization every 6 (six) hours as needed for wheezing or shortness of breath. 06/10/21   [name], PA-C  albuterol (VENTOLIN HFA) 108 (90 Base) MCG/ACT inhaler Inhale 2 puffs into the lungs every 4 (four) hours as needed for wheezing or shortness of breath. 10/11/22   [name], [name], DO  atorvastatin (LIPITOR) 10 MG tablet Take 10 mg by mouth daily. 03/20/23   [provider]  enalapril-hydrochlorothiazide (VASERETIC) 10-25 MG tablet Take 1 tablet by mouth daily. 05/06/23   [provider]  ibuprofen (ADVIL) 800 MG tablet Take 800 mg by mouth every 6 (six) hours as needed for mild pain (pain score 1-3) or  moderate pain (pain score 4-6). 05/06/23   [provider]  mometasone-formoterol (DULERA) 100-5 MCG/ACT AERO Inhale 2 puffs into the lungs 2 (two) times daily. 05/24/23   [name], MD  Multiple Vitamin (MULTIVITAMIN) capsule Take 1 capsule by mouth daily.    [provider]  oxyCODONE-acetaminophen (PERCOCET) 10-325 MG tablet Take 1 tablet by mouth every 4 (four) hours as needed for pain. 12/23/20   [provider]  potassium chloride SA (KLOR-CON M) 20 MEQ tablet Take 1 tablet (20 mEq total) by mouth daily. 03/29/23   [name], MD  predniSONE (DELTASONE) 20 MG tablet Take 2 tablets (40 mg total) by mouth daily with breakfast for 7 days. 05/25/23 06/01/23  [name], MD  zolpidem (AMBIEN) 5 MG tablet Take 5 mg by mouth at bedtime. 12/25/20   [provider]      Allergies    Patient has no known allergies.    Review of Systems   Review of Systems  Respiratory:  Positive for cough and shortness of breath.   All other systems reviewed and are negative.   Physical Exam Updated Vital Signs BP 121/74   Pulse 78   Temp 97.7 F (36.5 C) (Axillary)   Resp 15   Ht 5\' 1"  (1.549 m)   Wt 42.2 kg   SpO2 100%   BMI 17.58 kg/m  Physical Exam Vitals and nursing note reviewed.   Gen: Mild conversational dyspnea noted Eyes: PERRL, EOMI HEENT: no oropharyngeal  swelling Neck: trachea midline Resp: Managed with diffuse wheezes throughout all lung fields Card: RRR, no murmurs, rubs, or gallops Abd: nontender, nondistended Extremities: no calf tenderness, no edema Vascular: 2+ radial pulses bilaterally, 2+ DP pulses bilaterally Skin: no rashes Psyc: acting appropriately   ED Results / Procedures / Treatments   Labs (all labs ordered are listed, but only abnormal results are displayed) Labs Reviewed  BASIC METABOLIC PANEL - Abnormal; Notable for the following components:      Result Value   Chloride 96 (*)    Glucose, Bld 146 (*)     Creatinine, Ser 0.33 (*)    All other components within normal limits  CBC - Abnormal; Notable for the following components:   Hemoglobin 11.8 (*)    All other components within normal limits  BLOOD GAS, VENOUS - Abnormal; Notable for the following components:   pCO2, Ven 69 (*)    pO2, Ven 58 (*)    Bicarbonate 39.0 (*)    Acid-Base Excess 10.6 (*)    All other components within normal limits  CULTURE, BLOOD (ROUTINE X 2)  CULTURE, BLOOD (ROUTINE X 2)  CBC  CREATININE, SERUM  CBC  BASIC METABOLIC PANEL  PHOSPHORUS  MAGNESIUM  PROCALCITONIN    EKG None  Radiology DG Chest Port 1 View Result Date: 05/24/2023 CLINICAL DATA:  Shortness of breath EXAM: PORTABLE CHEST 1 VIEW COMPARISON:  Chest radiograph dated 05/21/2023 FINDINGS: Normal lung volumes. Hazy left lower lung opacity. No pleural effusion or pneumothorax. The heart size and mediastinal contours are within normal limits. No acute osseous abnormality. IMPRESSION: Hazy left lower lung opacity, which may be artifactual related to multiple overlying skin folds. Otherwise, no focal consolidations. Electronically Signed   By: Agustin Cree M.D.   On: 05/24/2023 16:37    Procedures Procedures    Medications Ordered in ED Medications  enoxaparin (LOVENOX) injection 30 mg (has no administration in time range)  ipratropium-albuterol (DUONEB) 0.5-2.5 (3) MG/3ML nebulizer solution 3 mL (3 mLs Nebulization Given 05/24/23 2057)  methylPREDNISolone sodium succinate (SOLU-MEDROL) 40 mg/mL injection 40 mg (40 mg Intravenous Given 05/24/23 2039)  prochlorperazine (COMPAZINE) injection 5 mg (has no administration in time range)  hydrALAZINE (APRESOLINE) injection 5 mg (has no administration in time range)  morphine (PF) 2 MG/ML injection 2 mg (has no administration in time range)  ipratropium-albuterol (DUONEB) 0.5-2.5 (3) MG/3ML nebulizer solution 3 mL (3 mLs Nebulization Given 05/24/23 1618)  ipratropium-albuterol (DUONEB) 0.5-2.5 (3)  MG/3ML nebulizer solution 3 mL (3 mLs Nebulization Given 05/24/23 1618)  ipratropium-albuterol (DUONEB) 0.5-2.5 (3) MG/3ML nebulizer solution 3 mL (3 mLs Nebulization Given 05/24/23 1618)  magnesium sulfate IVPB 2 g 50 mL (0 g Intravenous Stopped 05/24/23 1712)  enalapril (VASOTEC) tablet 10 mg (10 mg Oral Given 05/24/23 1655)  hydrochlorothiazide (HYDRODIURIL) tablet 25 mg (25 mg Oral Given 05/24/23 1655)  ipratropium-albuterol (DUONEB) 0.5-2.5 (3) MG/3ML nebulizer solution 3 mL (3 mLs Nebulization Given 05/24/23 1726)  cefTRIAXone (ROCEPHIN) 1 g in sodium chloride 0.9 % 100 mL IVPB (0 g Intravenous Stopped 05/24/23 2028)  azithromycin (ZITHROMAX) 500 mg in sodium chloride 0.9 % 250 mL IVPB (0 mg Intravenous Stopped 05/24/23 2028)  morphine (PF) 2 MG/ML injection 2 mg (2 mg Intravenous Given 05/24/23 2038)    ED Course/ Medical Decision Making/ A&P                                 Medical Decision  Making 67 year old female with past medical history of asthma and COPD as well as hypertension hyperlipidemia presenting to the emergency department today with worsening shortness of breath after being discharged from the hospital COPD exacerbation earlier today.  I will repeat basic labs as well as a chest x-ray on the patient here.  Will give her DuoNebs here. She did receive some steroids this morning.  I will give the patient magnesium given her questionable history of asthma.  I will reevaluate for ultimate disposition.  Patient's chest x-ray shows questionable infiltrate.  Her labs are stable.  She did have increased work of breathing and was placed on BiPAP with improvement.  Her venous blood gas shows a well compensated respiratory acidosis.  Calls placed to hospital service for admission.  CRITICAL CARE Performed by: Durwin Glaze   Total critical care time: 36 minutes  Critical care time was exclusive of separately billable procedures and treating other patients.  Critical care was  necessary to treat or prevent imminent or life-threatening deterioration.  Critical care was time spent personally by me on the following activities: development of treatment plan with patient and/or surrogate as well as nursing, discussions with consultants, evaluation of patient's response to treatment, examination of patient, obtaining history from patient or surrogate, ordering and performing treatments and interventions, ordering and review of laboratory studies, ordering and review of radiographic studies, pulse oximetry and re-evaluation of patient's condition.   Amount and/or Complexity of Data Reviewed Labs: ordered. Radiology: ordered.  Risk Prescription drug management. Decision regarding hospitalization.           Final Clinical Impression(s) / ED Diagnoses Final diagnoses:  COPD exacerbation [facility])    Rx / DC Orders ED Discharge Orders     None         Durwin Glaze, MD 05/24/23 2209

## 2023-05-24 NOTE — ED Notes (Signed)
Pt got on bedside commode and when she got up, sats dropped to 64% and pt began tripoding and working with accessory muscles. Pt placed on nonrebreather and respiratory at bedside.

## 2023-05-24 NOTE — Discharge Summary (Signed)
Physician Discharge Summary  VANNYA BICKNELL ZOX:[id number] DOB: March 16, 1956 DOA: 05/21/2023  PCP: Karenann Cai, NP  Admit date: 05/21/2023 Discharge date: 05/24/2023  Admitted From: Home Disposition: Home  Recommendations for Outpatient Follow-up:  Follow up with PCP in 1 week with repeat CBC/BMP Outpatient evaluation and follow-up with pulmonary Follow up in ED if symptoms worsen or new appear   Home Health: No Equipment/Devices: Oxygen via nasal cannula at 2 L/min.  Discharge Condition: Stable CODE STATUS: Full Diet recommendation: Heart healthy  Brief/Interim Summary: 67 y.o. female with medical history significant for hypertension, COPD and chronic back pain presented with worsening shortness of breath.  She initially required BiPAP on presentation but was subsequently switched to nasal cannula oxygen.  She tested positive for RSV.  Chest x-ray showed no active disease.  She was started on IV steroids along with nebs.  During the hospitalization, her condition has gradually improved.  She feels much better and wants to go home today.  She is still on supplemental oxygen at 2 L/min which will need to be continued.  She will be discharged home on prednisone, Dulera and as needed albuterol.  Discharge Diagnoses:   Acute respiratory failure with hypoxia COPD exacerbation due to RSV infection Tobacco use -Still requiring 2L oxygen via nasal cannula.  Wears oxygen at home only occasionally at night at 2 L/min.  Recommend that patient wear oxygen around-the-clock at 2 L/min for now.   -Treated with Solu-Medrol, nebs and Dulera.  She feels much better and wants to go home today. -Continue nebs and Dulera. -Counseled regarding cessation of tobacco use -She will be discharged home on prednisone, Dulera and as needed albuterol.  Outpatient evaluation and follow-up by pulmonary   Anxiety -Will need to follow-up with PCP regarding the same   hypokalemia -Improved.   Normocytic  anemia -Questionable cause.  Hemoglobin stable.  Monitor intermittently   Hyperlipidemia -Continue statin  Chronic pain with opiate dependence -Continue Percocet.  Outpatient follow-up with PCP and/or pain management  Underweight -Follow nutrition recommendations  Discharge Instructions  Discharge Instructions     Diet - low sodium heart healthy   Complete by: As directed    Increase activity slowly   Complete by: As directed       Allergies as of 05/24/2023   No Known Allergies      Medication List     STOP taking these medications    enalapril 10 MG tablet Commonly known as: VASOTEC   metoprolol tartrate 50 MG tablet Commonly known as: LOPRESSOR       TAKE these medications    albuterol (2.5 MG/3ML) 0.083% nebulizer solution Commonly known as: PROVENTIL Take 3 mLs (2.5 mg total) by nebulization every 6 (six) hours as needed for wheezing or shortness of breath.   albuterol 108 (90 Base) MCG/ACT inhaler Commonly known as: VENTOLIN HFA Inhale 2 puffs into the lungs every 4 (four) hours as needed for wheezing or shortness of breath.   atorvastatin 10 MG tablet Commonly known as: LIPITOR Take 10 mg by mouth daily.   enalapril-hydrochlorothiazide 10-25 MG tablet Commonly known as: VASERETIC Take 1 tablet by mouth daily.   ibuprofen 800 MG tablet Commonly known as: ADVIL Take 800 mg by mouth every 6 (six) hours as needed for mild pain (pain score 1-3) or moderate pain (pain score 4-6).   mometasone-formoterol 100-5 MCG/ACT Aero Commonly known as: DULERA Inhale 2 puffs into the lungs 2 (two) times daily.   multivitamin capsule Take 1 capsule  by mouth daily.   oxyCODONE-acetaminophen 10-325 MG tablet Commonly known as: PERCOCET Take 1 tablet by mouth every 4 (four) hours as needed for pain.   potassium chloride SA 20 MEQ tablet Commonly known as: KLOR-CON M Take 1 tablet (20 mEq total) by mouth daily.   predniSONE 20 MG tablet Commonly known  as: DELTASONE Take 2 tablets (40 mg total) by mouth daily with breakfast for 7 days. Start taking on: May 25, 2023 What changed:  medication strength how much to take   zolpidem 5 MG tablet Commonly known as: AMBIEN Take 5 mg by mouth at bedtime.        Follow-up Information     Karenann Cai, NP. Schedule an appointment as soon as possible for a visit in 1 week(s).   Specialty: Nurse Practitioner Contact information: [address] [place] [address] [phone]                No Known Allergies  Consultations: None   Procedures/Studies: DG Chest Port 1 View Result Date: 05/21/2023 CLINICAL DATA:  Dyspnea. EXAM: PORTABLE CHEST 1 VIEW COMPARISON:  March 29, 2023. FINDINGS: The heart size and mediastinal contours are within normal limits. Both lungs are clear. The visualized skeletal structures are unremarkable. IMPRESSION: No active disease. Electronically Signed   By: Lupita Raider M.D.   On: 05/21/2023 09:39      Subjective: Patient seen and examined at bedside.  Feels much better and wants to go home today.  Still having intermittent cough and exertional shortness of breath.  No fever or vomiting reported.  Discharge Exam: Vitals:   05/24/23 0548 05/24/23 0756  BP: (!) 162/90   Pulse: 73   Resp: 19   Temp: 97.8 F (36.6 C)   SpO2: 99% 99%    General: Pt is alert, awake, not in acute distress.  On 2 L oxygen via nasal cannula.  Looks thinly built and chronically ill and deconditioned. Cardiovascular: rate controlled, S1/S2 + Respiratory: bilateral decreased breath sounds at bases with mild scattered wheezing Abdominal: Soft, NT, ND, bowel sounds + Extremities: no edema, no cyanosis    The results of significant diagnostics from this hospitalization (including imaging, microbiology, ancillary and laboratory) are listed below for reference.     Microbiology: Recent Results (from the past 240 hours)  Resp panel by RT-PCR (RSV,  Flu A&B, Covid) Anterior Nasal Swab     Status: Abnormal   Collection Time: 05/21/23  9:35 AM   Specimen: Anterior Nasal Swab  Result Value Ref Range Status   SARS Coronavirus 2 by RT PCR NEGATIVE NEGATIVE Final    Comment: (NOTE) SARS-CoV-2 target nucleic acids are NOT DETECTED.  The SARS-CoV-2 RNA is generally detectable in upper respiratory specimens during the acute phase of infection. The lowest concentration of SARS-CoV-2 viral copies this assay can detect is 138 copies/mL. A negative result does not preclude SARS-Cov-2 infection and should not be used as the sole basis for treatment or other patient management decisions. A negative result may occur with  improper specimen collection/handling, submission of specimen other than nasopharyngeal swab, presence of viral mutation(s) within the areas targeted by this assay, and inadequate number of viral copies(<138 copies/mL). A negative result must be combined with clinical observations, patient history, and epidemiological information. The expected result is Negative.  Fact Sheet for Patients:  BloggerCourse.com  Fact Sheet for Healthcare Providers:  SeriousBroker.it  This test is no t yet approved or cleared by the [place] and  has been authorized for detection and/or diagnosis of SARS-CoV-2 by FDA under an Emergency Use Authorization (EUA). This EUA will remain  in effect (meaning this test can be used) for the duration of the COVID-19 declaration under Section 564(b)(1) of the Act, 21 U.S.C.section 360bbb-3(b)(1), unless the authorization is terminated  or revoked sooner.       Influenza A by PCR NEGATIVE NEGATIVE Final   Influenza B by PCR NEGATIVE NEGATIVE Final    Comment: (NOTE) The Xpert Xpress SARS-CoV-2/FLU/RSV plus assay is intended as an aid in the diagnosis of influenza from Nasopharyngeal swab specimens and should not be used as a sole basis for  treatment. Nasal washings and aspirates are unacceptable for Xpert Xpress SARS-CoV-2/FLU/RSV testing.  Fact Sheet for Patients: BloggerCourse.com  Fact Sheet for Healthcare Providers: SeriousBroker.it  This test is not yet approved or cleared by the [place] FDA and has been authorized for detection and/or diagnosis of SARS-CoV-2 by FDA under an Emergency Use Authorization (EUA). This EUA will remain in effect (meaning this test can be used) for the duration of the COVID-19 declaration under Section 564(b)(1) of the Act, 21 U.S.C. section 360bbb-3(b)(1), unless the authorization is terminated or revoked.     Resp Syncytial Virus by PCR POSITIVE (A) NEGATIVE Final    Comment: (NOTE) Fact Sheet for Patients: BloggerCourse.com  Fact Sheet for Healthcare Providers: SeriousBroker.it  This test is not yet approved or cleared by the [place] FDA and has been authorized for detection and/or diagnosis of SARS-CoV-2 by FDA under an Emergency Use Authorization (EUA). This EUA will remain in effect (meaning this test can be used) for the duration of the COVID-19 declaration under Section 564(b)(1) of the Act, 21 U.S.C. section 360bbb-3(b)(1), unless the authorization is terminated or revoked.  Performed at [facility], [address] [place], [place] [address]      Labs: BNP (last 3 results) Recent Labs    03/29/23 1834 05/21/23 0939  BNP 22.0 62.3   Basic Metabolic Panel: Recent Labs  Lab 05/21/23 0939 05/22/23 0409 05/23/23 0355  NA 135 139 138  K 2.2* 4.1 4.2  CL 92* 101 99  CO2 33* 33* 33*  GLUCOSE 125* 99 100*  BUN 13 16 14   CREATININE 0.69 0.57 0.45  CALCIUM 8.5* 8.5* 9.1  MG 2.1  --  2.2   Liver Function Tests: Recent Labs  Lab 05/21/23 0939  AST 22  ALT 12  ALKPHOS 45  BILITOT 0.6  PROT 6.8  ALBUMIN 4.0   No results for  input(s): "LIPASE", "AMYLASE" in the last 168 hours. No results for input(s): "AMMONIA" in the last 168 hours. CBC: Recent Labs  Lab 05/21/23 0939 05/22/23 0409  WBC 6.6 5.0  NEUTROABS 3.8  --   HGB 11.4* 10.1*  HCT 36.2 33.6*  MCV 95.0 98.5  PLT 276 257   Cardiac Enzymes: No results for input(s): "CKTOTAL", "CKMB", "CKMBINDEX", "TROPONINI" in the last 168 hours. BNP: Invalid input(s): "POCBNP" CBG: No results for input(s): "GLUCAP" in the last 168 hours. D-Dimer No results for input(s): "DDIMER" in the last 72 hours. Hgb A1c No results for input(s): "HGBA1C" in the last 72 hours. Lipid Profile No results for input(s): "CHOL", "HDL", "LDLCALC", "TRIG", "CHOLHDL", "LDLDIRECT" in the last 72 hours. Thyroid function studies No results for input(s): "TSH", "T4TOTAL", "T3FREE", "THYROIDAB" in the last 72 hours.  Invalid input(s): "FREET3" Anemia work up No results for input(s): "VITAMINB12", "FOLATE", "FERRITIN", "TIBC", "IRON", "RETICCTPCT" in the last 72  hours. Urinalysis    Component Value Date/Time   COLORURINE YELLOW 06/02/2022 1909   APPEARANCEUR HAZY (A) 06/02/2022 1909   LABSPEC 1.019 06/02/2022 1909   PHURINE 5.0 06/02/2022 1909   GLUCOSEU NEGATIVE 06/02/2022 1909   HGBUR NEGATIVE 06/02/2022 1909   BILIRUBINUR NEGATIVE 06/02/2022 1909   KETONESUR NEGATIVE 06/02/2022 1909   PROTEINUR NEGATIVE 06/02/2022 1909   UROBILINOGEN 0.2 10/29/2013 1114   NITRITE NEGATIVE 06/02/2022 1909   LEUKOCYTESUR TRACE (A) 06/02/2022 1909   Sepsis Labs Recent Labs  Lab 05/21/23 0939 05/22/23 0409  WBC 6.6 5.0   Microbiology Recent Results (from the past 240 hours)  Resp panel by RT-PCR (RSV, Flu A&B, Covid) Anterior Nasal Swab     Status: Abnormal   Collection Time: 05/21/23  9:35 AM   Specimen: Anterior Nasal Swab  Result Value Ref Range Status   SARS Coronavirus 2 by RT PCR NEGATIVE NEGATIVE Final    Comment: (NOTE) SARS-CoV-2 target nucleic acids are NOT  DETECTED.  The SARS-CoV-2 RNA is generally detectable in upper respiratory specimens during the acute phase of infection. The lowest concentration of SARS-CoV-2 viral copies this assay can detect is 138 copies/mL. A negative result does not preclude SARS-Cov-2 infection and should not be used as the sole basis for treatment or other patient management decisions. A negative result may occur with  improper specimen collection/handling, submission of specimen other than nasopharyngeal swab, presence of viral mutation(s) within the areas targeted by this assay, and inadequate number of viral copies(<138 copies/mL). A negative result must be combined with clinical observations, patient history, and epidemiological information. The expected result is Negative.  Fact Sheet for Patients:  BloggerCourse.com  Fact Sheet for Healthcare Providers:  SeriousBroker.it  This test is no t yet approved or cleared by the [place] FDA and  has been authorized for detection and/or diagnosis of SARS-CoV-2 by FDA under an Emergency Use Authorization (EUA). This EUA will remain  in effect (meaning this test can be used) for the duration of the COVID-19 declaration under Section 564(b)(1) of the Act, 21 U.S.C.section 360bbb-3(b)(1), unless the authorization is terminated  or revoked sooner.       Influenza A by PCR NEGATIVE NEGATIVE Final   Influenza B by PCR NEGATIVE NEGATIVE Final    Comment: (NOTE) The Xpert Xpress SARS-CoV-2/FLU/RSV plus assay is intended as an aid in the diagnosis of influenza from Nasopharyngeal swab specimens and should not be used as a sole basis for treatment. Nasal washings and aspirates are unacceptable for Xpert Xpress SARS-CoV-2/FLU/RSV testing.  Fact Sheet for Patients: BloggerCourse.com  Fact Sheet for Healthcare Providers: SeriousBroker.it  This test is not yet  approved or cleared by the [place] FDA and has been authorized for detection and/or diagnosis of SARS-CoV-2 by FDA under an Emergency Use Authorization (EUA). This EUA will remain in effect (meaning this test can be used) for the duration of the COVID-19 declaration under Section 564(b)(1) of the Act, 21 U.S.C. section 360bbb-3(b)(1), unless the authorization is terminated or revoked.     Resp Syncytial Virus by PCR POSITIVE (A) NEGATIVE Final    Comment: (NOTE) Fact Sheet for Patients: BloggerCourse.com  Fact Sheet for Healthcare Providers: SeriousBroker.it  This test is not yet approved or cleared by the [place] FDA and has been authorized for detection and/or diagnosis of SARS-CoV-2 by FDA under an Emergency Use Authorization (EUA). This EUA will remain in effect (meaning this test can be used) for the duration of the COVID-19 declaration under Section  564(b)(1) of the Act, 21 U.S.C. section 360bbb-3(b)(1), unless the authorization is terminated or revoked.  Performed at [facility], [address] [place], [place] [address]      Time coordinating discharge: 35 minutes  SIGNED:   [name], MD  [facility] 05/24/2023, 9:49 AM

## 2023-05-24 NOTE — Progress Notes (Signed)
Patient transferred from the ED WA16 to ICU 1222 without any complications. Patient tolerated well.

## 2023-05-24 NOTE — Progress Notes (Signed)
Bed status changed from Progressive care unit to stepdown unit (per hospital policy per nursing staff and [facility]) as the patient does not have a BIPAP at home but would benefit from wearing BIPAP at bedtime due to chronic hypercarbia.  Hospital policy was reviewed by [facility] on call.  Will continue to closely monitor and treat as indicated.  No charge note.

## 2023-05-24 NOTE — ED Notes (Signed)
Pt requesting her oxygen be turned down. RN turned down to 3 L [place].

## 2023-05-24 NOTE — Progress Notes (Signed)
Pt placed on BIPAP in ED due to WOB.

## 2023-05-24 NOTE — ED Triage Notes (Signed)
Pt from home, states being home for approx. An hour after being discharged from inpatient, states when she got home she couldn't catch her breath and called ems to bring her back here. Positive for RSV, hx COPD  Breathing treatment running from ems

## 2023-05-24 NOTE — TOC Transition Note (Signed)
Transition of Care [facility]) - Discharge Note   Patient Details  Name: [name] MRN: [id number] Date of Birth: 1955-06-08  Transition of Care [facility]) CM/SW Contact:  Larrie Kass, LCSW Phone Number: 05/24/2023, 10:08 AM   Clinical Narrative:    CSW spoke with pt regarding pt's home O2, she reports needing portable tank. Pt reports home O2 is from [employer]. CSW spoke with Jermaine a potable tank will be delivered to pt's room prior to d/c. CSW spoke with pt's granddaughter she will provided pt transport upon d/c . No further TOC needs TOC sign off.   Final next level of care: Home/Self Care Barriers to Discharge: No Barriers Identified   Patient Goals and CMS Choice Patient states their goals for this hospitalization and ongoing recovery are:: retrun home          Discharge Placement                  Name of family member notified: Koren Bound (Granddaughter)  [phone] (Mobile) Patient and family notified of of transfer: 05/24/23  Discharge Plan and Services Additional resources added to the After Visit Summary for                  DME Arranged: Oxygen DME Agency: [employer] Date DME Agency Contacted: 05/24/23 Time DME Agency Contacted: [phone] Representative spoke with at DME Agency: jermaine            Social Drivers of Health (SDOH) Interventions SDOH Screenings   Food Insecurity: No Food Insecurity (05/21/2023)  Housing: Low Risk  (05/21/2023)  Transportation Needs: No Transportation Needs (05/21/2023)  Utilities: Not At Risk (05/21/2023)  Depression (PHQ2-9): Low Risk  (10/05/2020)  Financial Resource Strain: Low Risk  (10/05/2020)  Social Connections: Moderately Integrated (10/05/2020)  Stress: No Stress Concern Present (10/05/2020)  Tobacco Use: High Risk (05/21/2023)     Readmission Risk Interventions    10/04/2022    9:50 AM  Readmission Risk Prevention Plan  Post Dischage Appt Complete  Medication Screening Complete   Transportation Screening Complete

## 2023-05-25 DIAGNOSIS — I10 Essential (primary) hypertension: Secondary | ICD-10-CM | POA: Diagnosis present

## 2023-05-25 DIAGNOSIS — D649 Anemia, unspecified: Secondary | ICD-10-CM | POA: Diagnosis present

## 2023-05-25 DIAGNOSIS — R062 Wheezing: Secondary | ICD-10-CM | POA: Diagnosis present

## 2023-05-25 DIAGNOSIS — Z79899 Other long term (current) drug therapy: Secondary | ICD-10-CM | POA: Diagnosis not present

## 2023-05-25 DIAGNOSIS — J441 Chronic obstructive pulmonary disease with (acute) exacerbation: Secondary | ICD-10-CM | POA: Diagnosis present

## 2023-05-25 DIAGNOSIS — Z801 Family history of malignant neoplasm of trachea, bronchus and lung: Secondary | ICD-10-CM | POA: Diagnosis not present

## 2023-05-25 DIAGNOSIS — X088XXA Exposure to other specified smoke, fire and flames, initial encounter: Secondary | ICD-10-CM | POA: Diagnosis present

## 2023-05-25 DIAGNOSIS — E8729 Other acidosis: Secondary | ICD-10-CM | POA: Diagnosis present

## 2023-05-25 DIAGNOSIS — R636 Underweight: Secondary | ICD-10-CM | POA: Diagnosis present

## 2023-05-25 DIAGNOSIS — Z90710 Acquired absence of both cervix and uterus: Secondary | ICD-10-CM | POA: Diagnosis not present

## 2023-05-25 DIAGNOSIS — F419 Anxiety disorder, unspecified: Secondary | ICD-10-CM | POA: Diagnosis present

## 2023-05-25 DIAGNOSIS — J9622 Acute and chronic respiratory failure with hypercapnia: Secondary | ICD-10-CM | POA: Diagnosis present

## 2023-05-25 DIAGNOSIS — Z833 Family history of diabetes mellitus: Secondary | ICD-10-CM | POA: Diagnosis not present

## 2023-05-25 DIAGNOSIS — M549 Dorsalgia, unspecified: Secondary | ICD-10-CM | POA: Diagnosis present

## 2023-05-25 DIAGNOSIS — G8929 Other chronic pain: Secondary | ICD-10-CM | POA: Diagnosis present

## 2023-05-25 DIAGNOSIS — Z79891 Long term (current) use of opiate analgesic: Secondary | ICD-10-CM | POA: Diagnosis not present

## 2023-05-25 DIAGNOSIS — Z808 Family history of malignant neoplasm of other organs or systems: Secondary | ICD-10-CM | POA: Diagnosis not present

## 2023-05-25 DIAGNOSIS — Z96642 Presence of left artificial hip joint: Secondary | ICD-10-CM | POA: Diagnosis present

## 2023-05-25 DIAGNOSIS — J9621 Acute and chronic respiratory failure with hypoxia: Secondary | ICD-10-CM | POA: Diagnosis present

## 2023-05-25 DIAGNOSIS — J45901 Unspecified asthma with (acute) exacerbation: Secondary | ICD-10-CM | POA: Diagnosis present

## 2023-05-25 DIAGNOSIS — Z681 Body mass index (BMI) 19 or less, adult: Secondary | ICD-10-CM | POA: Diagnosis not present

## 2023-05-25 DIAGNOSIS — E785 Hyperlipidemia, unspecified: Secondary | ICD-10-CM | POA: Diagnosis present

## 2023-05-25 DIAGNOSIS — B974 Respiratory syncytial virus as the cause of diseases classified elsewhere: Secondary | ICD-10-CM | POA: Diagnosis present

## 2023-05-25 DIAGNOSIS — F17210 Nicotine dependence, cigarettes, uncomplicated: Secondary | ICD-10-CM | POA: Diagnosis present

## 2023-05-25 DIAGNOSIS — Z8249 Family history of ischemic heart disease and other diseases of the circulatory system: Secondary | ICD-10-CM | POA: Diagnosis not present

## 2023-05-25 DIAGNOSIS — J432 Centrilobular emphysema: Secondary | ICD-10-CM | POA: Diagnosis present

## 2023-05-25 LAB — CBC
HCT: 33 % — ABNORMAL LOW (ref 36.0–46.0)
Hemoglobin: 10.3 g/dL — ABNORMAL LOW (ref 12.0–15.0)
MCH: 29.9 pg (ref 26.0–34.0)
MCHC: 31.2 g/dL (ref 30.0–36.0)
MCV: 95.7 fL (ref 80.0–100.0)
Platelets: 281 10*3/uL (ref 150–400)
RBC: 3.45 MIL/uL — ABNORMAL LOW (ref 3.87–5.11)
RDW: 14.1 % (ref 11.5–15.5)
WBC: 5.5 10*3/uL (ref 4.0–10.5)
nRBC: 0 % (ref 0.0–0.2)

## 2023-05-25 LAB — PROCALCITONIN: Procalcitonin: <0.1 ng/mL

## 2023-05-25 LAB — GLUCOSE, CAPILLARY: Glucose-Capillary: 100 mg/dL — ABNORMAL HIGH (ref 70–99)

## 2023-05-25 LAB — BLOOD GAS, VENOUS
Acid-Base Excess: 15.9 mmol/L — ABNORMAL HIGH (ref 0.0–2.0)
Bicarbonate: 42.4 mmol/L — ABNORMAL HIGH (ref 20.0–28.0)
O2 Saturation: 84.1 %
Patient temperature: 36.1
pCO2, Ven: 59 mm[Hg] (ref 44–60)
pH, Ven: 7.46 — ABNORMAL HIGH (ref 7.25–7.43)
pO2, Ven: 46 mm[Hg] — ABNORMAL HIGH (ref 32–45)

## 2023-05-25 LAB — BASIC METABOLIC PANEL
Anion gap: 8 (ref 5–15)
BUN: 13 mg/dL (ref 8–23)
CO2: 34 mmol/L — ABNORMAL HIGH (ref 22–32)
Calcium: 9.3 mg/dL (ref 8.9–10.3)
Chloride: 94 mmol/L — ABNORMAL LOW (ref 98–111)
Creatinine, Ser: 0.47 mg/dL (ref 0.44–1.00)
GFR, Estimated: >60 mL/min (ref >60)
Glucose, Bld: 125 mg/dL — ABNORMAL HIGH (ref 70–99)
Potassium: 4.1 mmol/L (ref 3.5–5.1)
Sodium: 136 mmol/L (ref 135–145)

## 2023-05-25 LAB — MRSA NEXT GEN BY PCR, NASAL: MRSA by PCR Next Gen: NOT DETECTED

## 2023-05-25 LAB — D-DIMER, QUANTITATIVE: D-Dimer, Quant: <0.27 ug{FEU}/mL (ref 0.00–0.50)

## 2023-05-25 LAB — LACTIC ACID, PLASMA: Lactic Acid, Venous: 1.2 mmol/L (ref 0.5–1.9)

## 2023-05-25 LAB — PHOSPHORUS: Phosphorus: 2.3 mg/dL — ABNORMAL LOW (ref 2.5–4.6)

## 2023-05-25 LAB — MAGNESIUM: Magnesium: 2.2 mg/dL (ref 1.7–2.4)

## 2023-05-25 MED ORDER — ADULT MULTIVITAMIN W/MINERALS CH
1.0000 | ORAL_TABLET | Freq: Every day | ORAL | Status: DC
  Administered 2023-05-25 – 2023-05-27 (×3): 1 via ORAL
  Filled 2023-05-25 (×3): qty 1

## 2023-05-25 MED ORDER — POTASSIUM PHOSPHATES 15 MMOLE/5ML IV SOLN: 30.0000 mmol | Freq: Once | INTRAVENOUS | Status: DC

## 2023-05-25 MED ORDER — ALUM & MAG HYDROXIDE-SIMETH 200-200-20 MG/5ML PO SUSP
30.0000 mL | ORAL | Status: DC | PRN
  Administered 2023-05-25: 30 mL via ORAL
  Filled 2023-05-25: qty 30

## 2023-05-25 MED ORDER — HYDROCHLOROTHIAZIDE 25 MG PO TABS
25.0000 mg | ORAL_TABLET | Freq: Every day | ORAL | Status: DC
  Filled 2023-05-25: qty 1

## 2023-05-25 MED ORDER — ZOLPIDEM TARTRATE 5 MG PO TABS
5.0000 mg | ORAL_TABLET | Freq: Every day | ORAL | Status: DC
  Administered 2023-05-25 – 2023-05-26 (×2): 5 mg via ORAL
  Filled 2023-05-25 (×2): qty 1

## 2023-05-25 MED ORDER — ATORVASTATIN CALCIUM 10 MG PO TABS
10.0000 mg | ORAL_TABLET | Freq: Every day | ORAL | Status: DC
  Administered 2023-05-25 – 2023-05-27 (×3): 10 mg via ORAL
  Filled 2023-05-25 (×3): qty 1

## 2023-05-25 MED ORDER — HYDROXYZINE HCL 10 MG PO TABS
10.0000 mg | ORAL_TABLET | Freq: Three times a day (TID) | ORAL | Status: DC | PRN
  Administered 2023-05-25 – 2023-05-27 (×6): 10 mg via ORAL
  Filled 2023-05-25 (×9): qty 1

## 2023-05-25 MED ORDER — ENALAPRIL-HYDROCHLOROTHIAZIDE 10-25 MG PO TABS: 1.0000 | ORAL_TABLET | Freq: Every day | ORAL | Status: DC

## 2023-05-25 MED ORDER — POLYETHYLENE GLYCOL 3350 17 G PO PACK: 17.0000 g | PACK | Freq: Every day | ORAL | Status: DC | PRN

## 2023-05-25 MED ORDER — MORPHINE SULFATE (PF) 2 MG/ML IV SOLN: 2.0000 mg | INTRAVENOUS | Status: DC | PRN

## 2023-05-25 MED ORDER — POTASSIUM CHLORIDE CRYS ER 20 MEQ PO TBCR: 20.0000 meq | EXTENDED_RELEASE_TABLET | Freq: Every day | ORAL | Status: DC

## 2023-05-25 MED ORDER — MELATONIN 5 MG PO TABS
5.0000 mg | ORAL_TABLET | Freq: Every evening | ORAL | Status: DC | PRN
  Administered 2023-05-25: 5 mg via ORAL
  Filled 2023-05-25 (×2): qty 1

## 2023-05-25 MED ORDER — OXYCODONE-ACETAMINOPHEN 5-325 MG PO TABS
1.0000 | ORAL_TABLET | ORAL | Status: DC | PRN
  Administered 2023-05-25 – 2023-05-26 (×5): 1 via ORAL
  Filled 2023-05-25 (×6): qty 1

## 2023-05-25 MED ORDER — OXYCODONE HCL 5 MG PO TABS
5.0000 mg | ORAL_TABLET | ORAL | Status: DC | PRN
  Administered 2023-05-25 – 2023-05-26 (×5): 5 mg via ORAL
  Filled 2023-05-25 (×6): qty 1

## 2023-05-25 MED ORDER — POTASSIUM PHOSPHATES 15 MMOLE/5ML IV SOLN
15.0000 mmol | Freq: Once | INTRAVENOUS | Status: AC
  Administered 2023-05-25: 15 mmol via INTRAVENOUS
  Filled 2023-05-25: qty 5

## 2023-05-25 MED ORDER — SODIUM CHLORIDE 0.9 % IV SOLN
500.0000 mg | INTRAVENOUS | Status: DC
  Administered 2023-05-25: 500 mg via INTRAVENOUS
  Filled 2023-05-25 (×2): qty 5

## 2023-05-25 MED ORDER — ENALAPRIL MALEATE 10 MG PO TABS
10.0000 mg | ORAL_TABLET | Freq: Every day | ORAL | Status: DC
  Administered 2023-05-25: 10 mg via ORAL
  Filled 2023-05-25 (×2): qty 1

## 2023-05-25 MED ORDER — OXYCODONE-ACETAMINOPHEN 10-325 MG PO TABS: 1.0000 | ORAL_TABLET | ORAL | Status: DC | PRN

## 2023-05-25 MED ORDER — SIMETHICONE 40 MG/0.6ML PO SUSP: 40.0000 mg | Freq: Four times a day (QID) | ORAL | Status: DC | PRN

## 2023-05-25 NOTE — Plan of Care (Signed)

## 2023-05-25 NOTE — TOC Initial Note (Addendum)
Transition of Care [facility]) - Initial/Assessment Note    Patient Details  Name: [name] MRN: [id number] Date of Birth: 07-07-1955  Transition of Care [facility]) CM/SW Contact:    Adrian Prows, RN Phone Number: 05/25/2023, 3:11 PM  Clinical Narrative:                 TOC for d/c planning; spoke w/ pt in room; pt says she lives at home; she plans to return at d/c; pt verified she has insurance/PP; she identified POC Koren Bound (grand daughter) [phone]; pt will arrange transportation home; she denies SDOH risks; pt says she has a walker, and shower chair; he home oxygen is w/ [employer]; pt says she needs a travel tank; pt says she does not have HH services; spoke w/Jermaine at [facility]; travel tank will be delivered to pt's room; agency contact info placed in follow up provider section of d/c instructions; also explained MOON to pt; she verbalized understanding and signed document; copy of document given to pt; TOC is following.  Expected Discharge Plan: Home/Self Care Barriers to Discharge: Continued Medical Work up   Patient Goals and CMS Choice Patient states their goals for this hospitalization and ongoing recovery are:: home CMS Medicare.gov Compare Post Acute Care list provided to:: Patient   [place] ownership interest in [facility].provided to:: Patient    Expected Discharge Plan and Services   Discharge Planning Services: CM Consult Post Acute Care Choice: Resumption of Svcs/PTA Provider (home oxygen w/ [employer]) Living arrangements for the past 2 months: Apartment                                      Prior Living Arrangements/Services Living arrangements for the past 2 months: Apartment Lives with:: Self Patient language and need for interpreter reviewed:: Yes Do you feel safe going back to the place where you live?: Yes      Need for Family Participation in Patient Care: Yes (Comment) Care giver support system in place?: Yes  (comment) Current home services: DME (walker, shower chair) Criminal Activity/Legal Involvement Pertinent to Current Situation/Hospitalization: No - Comment as needed  Activities of Daily Living   ADL Screening (condition at time of admission) Independently performs ADLs?: Yes (appropriate for developmental age) Is the patient deaf or have difficulty hearing?: No Does the patient have difficulty seeing, even when wearing glasses/contacts?: No Does the patient have difficulty concentrating, remembering, or making decisions?: No  Permission Sought/Granted Permission sought to share information with : Case Manager Permission granted to share information with : Yes, Verbal Permission Granted  Share Information with NAME: Case Manager     Permission granted to share info w Relationship: Koren Bound (grand daughter) [phone]     Emotional Assessment Appearance:: Appears stated age Attitude/Demeanor/Rapport: Gracious Affect (typically observed): Accepting Orientation: : Oriented to Self, Oriented to Place, Oriented to  Time, Oriented to Situation Alcohol / Substance Use: Not Applicable Psych Involvement: No (comment)  Admission diagnosis:  Wheezing [R06.2] COPD exacerbation (HCC) [J44.1] Patient Active Problem List   Diagnosis Date Noted   Wheezing 05/24/2023   COPD exacerbation (HCC) 05/22/2023   Precordial chest pain 03/31/2023   Centrilobular emphysema (HCC) 12/13/2022   Palliative care by specialist 10/10/2022   Goals of care, counseling/discussion 10/10/2022   Vocal cord dysfunction 10/08/2022   Anxiety 10/08/2022   Protein-calorie malnutrition, severe 10/04/2022   COPD with acute exacerbation (HCC) 10/02/2022  Grief 06/03/2022   Pain of left upper extremity 12/28/2016   Elevated TSH 11/08/2016   Gastroesophageal reflux disease 11/08/2016   Constipation 11/08/2016   COPD (chronic obstructive pulmonary disease) (HCC) 11/08/2016   Tobacco use 11/08/2016   Atypical  chest pain 11/07/2016   Unstable angina (HCC) 01/07/2016   HTN (hypertension) 01/07/2016   Acute blood loss anemia 11/06/2013   Osteoarthritis of left hip 11/05/2013   History of total left hip replacement 11/05/2013   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   PCP:  Karenann Cai, NP Pharmacy:   [facility] [phone] - [place], [place] - [address] AT [facility] OF [address] [place] [address] Phone: [phone] Fax: [phone]     Social Drivers of Health (SDOH) Social History: SDOH Screenings   Food Insecurity: No Food Insecurity (05/25/2023)  Housing: Low Risk  (05/25/2023)  Transportation Needs: No Transportation Needs (05/25/2023)  Utilities: Not At Risk (05/25/2023)  Depression (PHQ2-9): Low Risk  (10/05/2020)  Financial Resource Strain: Low Risk  (10/05/2020)  Social Connections: Moderately Integrated (10/05/2020)  Stress: No Stress Concern Present (10/05/2020)  Tobacco Use: High Risk (05/24/2023)   SDOH Interventions: Food Insecurity Interventions: Intervention Not Indicated, Inpatient TOC Housing Interventions: Intervention Not Indicated, Inpatient TOC Transportation Interventions: Intervention Not Indicated, Inpatient TOC Utilities Interventions: Intervention Not Indicated, Inpatient TOC   Readmission Risk Interventions    10/04/2022    9:50 AM  Readmission Risk Prevention Plan  Post Dischage Appt Complete  Medication Screening Complete  Transportation Screening Complete

## 2023-05-25 NOTE — Hospital Course (Signed)
[name] is a 67 y.o. female with past medical history significant for COPD, asthma, centrilobular emphysema followed by pulmonary Dr. Francine Graven, chronic hypoxia on 2 L nasal cannula as needed at baseline, former tobacco user,  hypertension, hyperlipidemia, chronic back pain post fall years ago, who was discharged home on the day of presentation again in the ED after being treated for t COPD exacerbation, RSV infection.  Patient stated that she continued to have worsening shortness of breath and wheezing not improved with inhaler and nebulizer and presented back to the ED.  In the ED, blood pressure was elevated at 180/102 tachypnea at 22, patient was afebrile without leukocytosis.  Chest x-ray showed some left lower lung opacity.  Patient received Rocephin and Zithromax breathing treatment and IV magnesium in the ED and was put on BiPAP for increased work of breathing and was admitted hospital for further evaluation and treatment.  Assessment and plan.  Acute asthma exacerbation with superimposed COPD, centrilobular emphysema Likely secondary to exposure to smoke.  Continue azithromycin, blood cultures negative in less than 12 hours.  Lactate was 1.2.  D-dimer less than 0.27. RSV positive on 05/21/2023.  Continue DuoNeb Solu-Medrol wean BiPAP as tolerated.  Acute on chronic hypoxic and hypercarbic respiratory failure in the setting of acute asthma exacerbation At baseline on 2 L nasal cannula as needed.  On presentation compensated respiratory acidosis with hypercarbia, VBG pH 7.36, pCO2 69, serum bicarb 31 on 05/24/2023.  MRSA PCR negative.  D-dimer negative.  Needed BiPAP due to increased work of breathing, likely need nightly BiPAP at home for chronic hypercarbia. Continue BiPAP nightly.  Incentive spirometry flutter valve.  Hypertension On enalapril HCTZ at home.  Currently on hydralazine as needed.  Will resume oral medication.   Hyperlipidemia Will resume statins.   Chronic back pain post   On chronic opiates use.  Will resume   Underweight BMI 17. Encourage oral intake,.   Chronic normocytic anemia Latest hemoglobin of 10.3.

## 2023-05-25 NOTE — Progress Notes (Signed)
PROGRESS NOTE    [name]  ZOX:[id number] DOB: 02/26/1956 DOA: 05/24/2023 PCP: Karenann Cai, NP    Brief Narrative:  [name] is a 67 y.o. female with past medical history significant for COPD, asthma, centrilobular emphysema followed by pulmonary Dr. Francine Graven, chronic hypoxia on 2 L nasal cannula as needed at baseline, former tobacco user,  hypertension, hyperlipidemia, chronic back pain post fall years ago, who was discharged home on the day of presentation again in the ED after being treated for t COPD exacerbation, RSV infection.  Patient stated that she continued to have worsening shortness of breath and wheezing not improved with inhaler and nebulizer and presented back to the ED.  In the ED, blood pressure was elevated at 180/102 tachypnea at 22, patient was afebrile without leukocytosis.  Chest x-ray showed some left lower lung opacity.  Patient received Rocephin and Zithromax breathing treatment and IV magnesium in the ED and was put on BiPAP for increased work of breathing and was admitted hospital for further evaluation and treatment.  Assessment and plan.  Acute asthma exacerbation with superimposed COPD, centrilobular emphysema Likely secondary to exposure to smoke.  Continue azithromycin, blood cultures negative in less than 12 hours.  Lactate was 1.2.  D-dimer less than 0.27. RSV positive on 05/21/2023.  Continue DuoNeb, Solu-Medrol, wean BiPAP as tolerated.  Currently off BiPAP and wishes to eat.  Follow blood cultures.  Procalcitonin less than 0.1  Acute on chronic hypoxic and hypercarbic respiratory failure in the setting of acute asthma exacerbation At baseline on 2 L nasal cannula as needed.  On presentation compensated respiratory acidosis with hypercarbia, VBG pH 7.36, pCO2 69, serum bicarb 31 on 05/24/2023.  MRSA PCR negative.  D-dimer negative.  Needed BiPAP due to increased work of breathing, likely need nightly BiPAP at home for chronic hypercarbia.  Continue BiPAP nightly if needed.  Patient was not on BiPAP in the past..  Incentive spirometry to resume, states that she could not do flutter valve  Hypertension On enalapril HCTZ at home.  Currently on hydralazine as needed.  Will resume oral medication.   Hyperlipidemia Will resume statins.   Chronic back pain post  On chronic opiates use.  Will resume   Underweight BMI 17. Encourage oral intake,.   Chronic normocytic anemia Latest hemoglobin of 10.3.      DVT prophylaxis: enoxaparin (LOVENOX) injection 30 mg Start: 05/24/23 2200   Code Status:     Code Status: Full Code  Disposition: Home likely in 1 to 2 days if continues to improve.  Status is: Observation  The patient will require care spanning > 2 midnights and should be moved to inpatient because: Rebound admission for respiratory failure, need of BiPAP, pending clinical improvement.   Family Communication: None at bedside  Consultants:  None  Procedures:  BiPAP placement  Antimicrobials:  Azithromycin  Anti-infectives (From admission, onward)    Start     Dose/Rate Route Frequency Ordered Stop   05/25/23 1000  azithromycin (ZITHROMAX) 500 mg in sodium chloride 0.9 % 250 mL IVPB        500 mg 250 mL/hr  Intravenous Every 24 hours 05/25/23 0322 05/29/23 0959   05/24/23 1715  cefTRIAXone (ROCEPHIN) 1 g in sodium chloride 0.9 % 100 mL IVPB        1 g 200 mL/hr over 30 Minutes Intravenous  Once 05/24/23 1712 05/24/23 2028   05/24/23 1715  azithromycin (ZITHROMAX) 500 mg in sodium chloride 0.9 % 250 mL  IVPB        500 mg 250 mL/hr over 60 Minutes Intravenous  Once 05/24/23 1712 05/24/23 2028       Subjective: Today, patient was seen and examined at bedside.  Patient states that she feels hungry and wants to eat.  Still complains of some cough, wheezing congestion and shortness of breath but feels a little better than yesterday.  Feels gaseous in her stomach.  Complains of back  pain.  Objective: Vitals:   05/25/23 0743 05/25/23 0800 05/25/23 1000 05/25/23 1028  BP:  (!) 126/99 (!) 142/83 (!) 142/83  Pulse: 83 82    Resp: 13 16 20    Temp:      TempSrc:      SpO2: 100% 97%    Weight:      Height:        Intake/Output Summary (Last 24 hours) at 05/25/2023 1131 Last data filed at 05/25/2023 0800 Gross per 24 hour  Intake 324.54 ml  Output --  Net 324.54 ml   Filed Weights   05/24/23 1533  Weight: 42.2 kg    Physical Examination:  Body mass index is 17.58 kg/m.   General: Thinly built, not in obvious distress HENT:   No scleral pallor or icterus noted. Oral mucosa is moist.  Chest:    Diminished breath sounds bilaterally.  Mild wheezes noted. CVS: S1 &S2 heard. No murmur.  Regular rate and rhythm. Abdomen: Soft, nontender, nondistended.  Bowel sounds are heard.   Extremities: No cyanosis, clubbing or edema.  Peripheral pulses are palpable. Psych: Alert, awake and oriented, normal mood CNS:  No cranial nerve deficits.  Power equal in all extremities.   Skin: Warm and dry.  No rashes noted.  Data Reviewed:   CBC: Recent Labs  Lab 05/21/23 0939 05/22/23 0409 05/24/23 1610 05/25/23 0246  WBC 6.6 5.0 6.0 5.5  NEUTROABS 3.8  --   --   --   HGB 11.4* 10.1* 11.8* 10.3*  HCT 36.2 33.6* 38.4 33.0*  MCV 95.0 98.5 97.2 95.7  PLT 276 257 313 281    Basic Metabolic Panel: Recent Labs  Lab 05/21/23 0939 05/22/23 0409 05/23/23 0355 05/24/23 1610 05/25/23 0246  NA 135 139 138 136 136  K 2.2* 4.1 4.2 4.7 4.1  CL 92* 101 99 96* 94*  CO2 33* 33* 33* 31 34*  GLUCOSE 125* 99 100* 146* 125*  BUN 13 16 14 16 13   CREATININE 0.69 0.57 0.45 0.33* 0.47  CALCIUM 8.5* 8.5* 9.1 9.5 9.3  MG 2.1  --  2.2  --  2.2  PHOS  --   --   --   --  2.3*    Liver Function Tests: Recent Labs  Lab 05/21/23 0939  AST 22  ALT 12  ALKPHOS 45  BILITOT 0.6  PROT 6.8  ALBUMIN 4.0     Radiology Studies: DG Chest Port 1 View Result Date:  05/24/2023 CLINICAL DATA:  Shortness of breath EXAM: PORTABLE CHEST 1 VIEW COMPARISON:  Chest radiograph dated 05/21/2023 FINDINGS: Normal lung volumes. Hazy left lower lung opacity. No pleural effusion or pneumothorax. The heart size and mediastinal contours are within normal limits. No acute osseous abnormality. IMPRESSION: Hazy left lower lung opacity, which may be artifactual related to multiple overlying skin folds. Otherwise, no focal consolidations. Electronically Signed   By: Agustin Cree M.D.   On: 05/24/2023 16:37      LOS: 0 days     Joycelyn Das, MD [facility] Available  via Epic secure chat 7am-7pm After these hours, please refer to coverage provider listed on [email or web address] 05/25/2023, 11:31 AM

## 2023-05-25 NOTE — Plan of Care (Signed)

## 2023-05-25 NOTE — Care Management Obs Status (Signed)
MEDICARE OBSERVATION STATUS NOTIFICATION   Patient Details  Name: [name] MRN: [id number] Date of Birth: 01/16/1956   Medicare Observation Status Notification Given:  Yes    Adrian Prows, RN 05/25/2023, 3:02 PM

## 2023-05-26 DIAGNOSIS — R062 Wheezing: Secondary | ICD-10-CM | POA: Diagnosis not present

## 2023-05-26 LAB — CBC
HCT: 36.2 % (ref 36.0–46.0)
Hemoglobin: 11.1 g/dL — ABNORMAL LOW (ref 12.0–15.0)
MCH: 29.5 pg (ref 26.0–34.0)
MCHC: 30.7 g/dL (ref 30.0–36.0)
MCV: 96.3 fL (ref 80.0–100.0)
Platelets: 305 10*3/uL (ref 150–400)
RBC: 3.76 MIL/uL — ABNORMAL LOW (ref 3.87–5.11)
RDW: 14.2 % (ref 11.5–15.5)
WBC: 6.3 10*3/uL (ref 4.0–10.5)
nRBC: 0 % (ref 0.0–0.2)

## 2023-05-26 LAB — BASIC METABOLIC PANEL
Anion gap: 7 (ref 5–15)
BUN: 16 mg/dL (ref 8–23)
CO2: 35 mmol/L — ABNORMAL HIGH (ref 22–32)
Calcium: 9.1 mg/dL (ref 8.9–10.3)
Chloride: 97 mmol/L — ABNORMAL LOW (ref 98–111)
Creatinine, Ser: 0.57 mg/dL (ref 0.44–1.00)
GFR, Estimated: >60 mL/min (ref >60)
Glucose, Bld: 83 mg/dL (ref 70–99)
Potassium: 4.4 mmol/L (ref 3.5–5.1)
Sodium: 139 mmol/L (ref 135–145)

## 2023-05-26 LAB — MAGNESIUM: Magnesium: 2.3 mg/dL (ref 1.7–2.4)

## 2023-05-26 MED ORDER — BUSPIRONE HCL 10 MG PO TABS
10.0000 mg | ORAL_TABLET | Freq: Two times a day (BID) | ORAL | Status: DC
  Administered 2023-05-26: 10 mg via ORAL
  Filled 2023-05-26: qty 1

## 2023-05-26 MED ORDER — DM-GUAIFENESIN ER 30-600 MG PO TB12
1.0000 | ORAL_TABLET | Freq: Two times a day (BID) | ORAL | Status: DC
  Administered 2023-05-26 – 2023-05-27 (×3): 1 via ORAL
  Filled 2023-05-26 (×3): qty 1

## 2023-05-26 MED ORDER — BUSPIRONE HCL 10 MG PO TABS
10.0000 mg | ORAL_TABLET | Freq: Three times a day (TID) | ORAL | Status: DC
Start: 2023-05-26 — End: 2023-05-27
  Administered 2023-05-26 – 2023-05-27 (×3): 10 mg via ORAL
  Filled 2023-05-26: qty 1
  Filled 2023-05-26: qty 2
  Filled 2023-05-26 (×2): qty 1

## 2023-05-26 MED ORDER — OXYCODONE HCL 5 MG PO TABS
10.0000 mg | ORAL_TABLET | ORAL | Status: DC | PRN
  Administered 2023-05-26 – 2023-05-27 (×5): 10 mg via ORAL
  Filled 2023-05-26 (×5): qty 2

## 2023-05-26 MED ORDER — ACETAMINOPHEN 325 MG PO TABS: 650.0000 mg | ORAL_TABLET | Freq: Four times a day (QID) | ORAL | Status: DC | PRN

## 2023-05-26 MED ORDER — AZITHROMYCIN 250 MG PO TABS
500.0000 mg | ORAL_TABLET | Freq: Every day | ORAL | Status: DC
  Administered 2023-05-26 – 2023-05-27 (×2): 500 mg via ORAL
  Filled 2023-05-26 (×2): qty 2

## 2023-05-26 MED ORDER — PROCHLORPERAZINE EDISYLATE 10 MG/2ML IJ SOLN: 5.0000 mg | Freq: Four times a day (QID) | INTRAMUSCULAR | Status: DC | PRN

## 2023-05-26 MED ORDER — BENZONATATE 100 MG PO CAPS
100.0000 mg | ORAL_CAPSULE | Freq: Three times a day (TID) | ORAL | Status: DC
  Administered 2023-05-26 – 2023-05-27 (×4): 100 mg via ORAL
  Filled 2023-05-26 (×5): qty 1

## 2023-05-26 MED ORDER — ONDANSETRON HCL 4 MG/2ML IJ SOLN: 4.0000 mg | Freq: Four times a day (QID) | INTRAMUSCULAR | Status: DC | PRN

## 2023-05-26 MED ORDER — ENSURE ENLIVE PO LIQD
237.0000 mL | Freq: Two times a day (BID) | ORAL | Status: DC
  Administered 2023-05-27: 237 mL via ORAL

## 2023-05-26 NOTE — Progress Notes (Signed)
[facility] Progress Note Patient: [name] AOZ:[id number] DOB: 07-Feb-1956 DOA: 05/24/2023  DOS: the patient was seen and examined on 05/26/2023  Brief Hospital Course: [name] is a 67 y.o. female with past medical history significant for COPD, asthma, centrilobular emphysema followed by pulmonary Dr. Francine Graven, chronic hypoxia on 2 L nasal cannula as needed at baseline, former tobacco user,  hypertension, hyperlipidemia, chronic back pain post fall years ago, who was discharged home on the day of presentation after being treated for COPD exacerbation, RSV infection.  Patient stated that she continued to have worsening shortness of breath and wheezing not improved with inhaler and nebulizer and presented back to the ED.  Assessment and plan. Acute asthma exacerbation with superimposed COPD, centrilobular emphysema Likely secondary to exposure to smoke. Treated with antibiotic. Will discontinue. D-dimer negative. Oxygenation improving. RSV was positive Continue nebulizer therapy. Continue prednisone taper. No indication for BiPAP.  Will transfer out of the stepdown unit.  Anxiety. Initiated on BuSpar. Will monitor.  Hypertension On enalapril HCTZ at home.  Currently on hydralazine as needed.  Will resume oral medication.   Hyperlipidemia On statin.  Chronic back pain  On chronic opiates use.  Will resume   Underweight Body mass index is 17.58 kg/m.  Continue supplements.   Chronic normocytic anemia Hemoglobin stable.  Monitor.   Subjective: No nausea no vomiting.  Breathing improving.  No fever no chills.  Physical Exam: General: in Mild distress, No Rash Cardiovascular: S1 and S2 Present, No Murmur Respiratory: Increased respiratory effort, Bilateral Air entry present. No Crackles, bilateral wheezes Abdomen: Bowel Sound present, No tenderness Extremities: No edema Neuro: Alert and oriented x3, no new focal deficit  Data Reviewed: I have Reviewed  nursing notes, Vitals, and Lab results. Since last encounter, pertinent lab results CBC and BMP   . I have ordered test including CBC and BMP  .   Disposition: Status is: Inpatient Remains inpatient appropriate because: Monitor for improvement in respiratory status  enoxaparin (LOVENOX) injection 30 mg Start: 05/24/23 2200   Family Communication: No one at bedside Level of care: Med-Surg transfer from stepdown. Vitals:   05/26/23 0800 05/26/23 1200 05/26/23 1443 05/26/23 1516  BP:    132/81  Pulse:    78  Resp:   20 20  Temp: 98.5 F (36.9 C) 97.7 F (36.5 C)  99.5 F (37.5 C)  TempSrc: Oral Oral    SpO2:    98%  Weight:      Height:         Author: Lynden Oxford, MD 05/26/2023 6:55 PM  Please look on [email or web address] to find out who is on call.

## 2023-05-26 NOTE — Plan of Care (Signed)
  Problem: Education: Goal: Knowledge of General Education information will improve Description: Including pain rating scale, medication(s)/side effects and non-pharmacologic comfort measures Outcome: Progressing   Problem: Nutrition: Goal: Adequate nutrition will be maintained Outcome: Progressing   

## 2023-05-26 NOTE — Progress Notes (Signed)
Patient becoming defiant with staff, refusing at first to take medication stating that " we are all out to get her and that she is not safe" RN reassured patient that she is safe and that the doctor ordered these medications but patient continued to refuse. Patient called RN back into the room and agreed to take medication 10 minutes later.

## 2023-05-27 DIAGNOSIS — R062 Wheezing: Secondary | ICD-10-CM | POA: Diagnosis not present

## 2023-05-27 LAB — BASIC METABOLIC PANEL
Anion gap: 6 (ref 5–15)
BUN: 22 mg/dL (ref 8–23)
CO2: 35 mmol/L — ABNORMAL HIGH (ref 22–32)
Calcium: 9.1 mg/dL (ref 8.9–10.3)
Chloride: 96 mmol/L — ABNORMAL LOW (ref 98–111)
Creatinine, Ser: 0.46 mg/dL (ref 0.44–1.00)
GFR, Estimated: >60 mL/min (ref >60)
Glucose, Bld: 85 mg/dL (ref 70–99)
Potassium: 4.2 mmol/L (ref 3.5–5.1)
Sodium: 137 mmol/L (ref 135–145)

## 2023-05-27 LAB — CBC
HCT: 36.6 % (ref 36.0–46.0)
Hemoglobin: 11.1 g/dL — ABNORMAL LOW (ref 12.0–15.0)
MCH: 29.3 pg (ref 26.0–34.0)
MCHC: 30.3 g/dL (ref 30.0–36.0)
MCV: 96.6 fL (ref 80.0–100.0)
Platelets: 274 10*3/uL (ref 150–400)
RBC: 3.79 MIL/uL — ABNORMAL LOW (ref 3.87–5.11)
RDW: 13.9 % (ref 11.5–15.5)
WBC: 6.8 10*3/uL (ref 4.0–10.5)
nRBC: 0 % (ref 0.0–0.2)

## 2023-05-27 MED ORDER — PREDNISONE 10 MG PO TABS: ORAL_TABLET | ORAL | 0 refills | Status: DC

## 2023-05-27 MED ORDER — ALBUTEROL SULFATE (2.5 MG/3ML) 0.083% IN NEBU
2.5000 mg | INHALATION_SOLUTION | Freq: Four times a day (QID) | RESPIRATORY_TRACT | 0 refills | Status: DC | PRN
Start: 2023-05-27 — End: 2023-09-05

## 2023-05-27 MED ORDER — POLYETHYLENE GLYCOL 3350 17 G PO PACK: 17.0000 g | PACK | Freq: Every day | ORAL | 0 refills | Status: DC | PRN

## 2023-05-27 MED ORDER — DM-GUAIFENESIN ER 30-600 MG PO TB12: 1.0000 | ORAL_TABLET | Freq: Two times a day (BID) | ORAL | 0 refills | Status: AC

## 2023-05-27 MED ORDER — BUSPIRONE HCL 10 MG PO TABS: 10.0000 mg | ORAL_TABLET | Freq: Two times a day (BID) | ORAL | 0 refills | Status: DC

## 2023-05-27 MED ORDER — IPRATROPIUM-ALBUTEROL 0.5-2.5 (3) MG/3ML IN SOLN
3.0000 mL | Freq: Three times a day (TID) | RESPIRATORY_TRACT | Status: DC
  Administered 2023-05-27: 3 mL via RESPIRATORY_TRACT
  Filled 2023-05-27: qty 3

## 2023-05-27 MED ORDER — BENZONATATE 100 MG PO CAPS: 100.0000 mg | ORAL_CAPSULE | Freq: Three times a day (TID) | ORAL | 0 refills | Status: DC

## 2023-05-27 NOTE — TOC Transition Note (Addendum)
Transition of Care [facility]) - Discharge Note  Patient Details  Name: [name] MRN: [id number] Date of Birth: [birth year]  [facility] of [facility]) CM/SW Contact:  Ewing Schlein, LCSW Phone Number: 05/27/2023, 10:01 AM  Clinical Narrative: Patient confirmed [employer] delivered a travel tank to her room a few days ago, so she has it for discharge home today. No other TOC needs identified at this time. TOC signing off.  Addendum: Patient is requesting HH services. Hospitalist requesting [facility] for COPD disease management. Patient has used [facility] before and requested the agency again. CSW made [facility] referral to [facility] with [facility], which was accepted. CSW updated patient.  Addendum #2: Patient reported she does not have a ride home. CSW encouraged patient to call the family listed as there are 4 local granddaughters. Per patient, they cannot pick her up. CSW received approval for a taxi voucher, but patient informed CSW she found a ride.  Final next level of care: Home w Home Health Services Barriers to Discharge: Barriers Resolved  Patient Goals and CMS Choice Patient states their goals for this hospitalization and ongoing recovery are:: home CMS Medicare.gov Compare Post Acute Care list provided to:: Patient [place] ownership interest in [facility].provided to:: Patient   Discharge Plan and Services Additional resources added to the After Visit Summary for   Discharge Planning Services: CM Consult Post Acute Care Choice: Resumption of Svcs/PTA Provider (home oxygen w/ [employer])          DME Agency: [employer]  Social Drivers of Health (SDOH) Interventions SDOH Screenings   Food Insecurity: No Food Insecurity (05/25/2023)  Housing: Low Risk  (05/25/2023)  Transportation Needs: No Transportation Needs (05/25/2023)  Utilities: Not At Risk (05/25/2023)  Depression (PHQ2-9): Low Risk  (10/05/2020)  Financial Resource Strain: Low Risk  (10/05/2020)  Social  Connections: Moderately Integrated (05/27/2023)  Stress: No Stress Concern Present (10/05/2020)  Tobacco Use: High Risk (05/24/2023)   Readmission Risk Interventions    10/04/2022    9:50 AM  Readmission Risk Prevention Plan  Post Dischage Appt Complete  Medication Screening Complete  Transportation Screening Complete

## 2023-05-29 LAB — CULTURE, BLOOD (ROUTINE X 2)
Culture: NO GROWTH
Culture: NO GROWTH
Special Requests: ADEQUATE
Special Requests: ADEQUATE

## 2023-06-21 ENCOUNTER — Telehealth: Payer: Self-pay | Admitting: Pulmonary Disease

## 2023-06-21 DIAGNOSIS — J9612 Chronic respiratory failure with hypercapnia: Secondary | ICD-10-CM

## 2023-06-21 NOTE — Telephone Encounter (Signed)
Patient is here in office. An appointment has been made. She is in need of a cpap machine. I do not see where the order was placed but I do see that she had a sleep study. Please call and advise [phone]

## 2023-06-25 NOTE — Telephone Encounter (Signed)
Based on her sleep study from 11/2022 please place orders for the following:  Trial of BiPAP therapy on 8/4 cm H2O with a Small size Resmed Full Face AirFit F10 mask and heated humidification.   Thanks, JD

## 2023-06-26 ENCOUNTER — Ambulatory Visit
Admission: RE | Admit: 2023-06-26 | Discharge: 2023-06-26 | Disposition: A | Discharge status: Home / Self Care | Payer: Medicare Other | Source: Ambulatory Visit | Attending: Student

## 2023-06-26 ENCOUNTER — Other Ambulatory Visit: Payer: Self-pay | Admitting: Student

## 2023-06-26 DIAGNOSIS — J441 Chronic obstructive pulmonary disease with (acute) exacerbation: Secondary | ICD-10-CM

## 2023-06-26 NOTE — Telephone Encounter (Signed)
Order has been placed.

## 2023-07-03 ENCOUNTER — Telehealth: Payer: Self-pay | Admitting: Pulmonary Disease

## 2023-07-04 NOTE — Telephone Encounter (Signed)
 In need of prior sleep study and updated insurance. Do you all happen to know where pt had initial sleep study?   New, [name], [name], [name], [name],  Yes, per notes we need the original diagnstic sleep study. We have a copy of the titration but need the original to process. It also look like we are in need of some updated insurance info as well.  I looked in the EMR and do not see a copy of the original sleep study, so did [name].   Also, please reply to me on this, Marisols last day was yesterday.  Thank you,  [name]

## 2023-07-04 NOTE — Telephone Encounter (Signed)
 She had bipap titration for hypercapnic respiratory failure. There was no original sleep study. Please see my clinic note 11/05/22.  JD

## 2023-07-17 NOTE — Telephone Encounter (Signed)
PCC's, please advise. Is baseline sleep study needed?

## 2023-07-17 NOTE — Telephone Encounter (Signed)
I just got a response from [place] with [employer], [name] may already be working with Marthann Schiller on this so if this is a duplicate I apologize. I just received replay back from our vent team that :  PAP could not get qualifying info via sleep dx. I sent an email to [facility] requesting follow up as I need the ABG result/lab mentioned in documentation, notation that OSA had been ruled out as an issue and a qualifying ONO on at least 2 lpm or patient's liter flow (whichever was higher). This patient has [facility]) and will need auth, but I need all info to place the request.

## 2023-07-17 NOTE — Telephone Encounter (Signed)
I have sent an urgent message to [employer] about this. Patient was on Bipap in the hospital 09/2022. I don't think there was an original sleep study

## 2023-07-18 NOTE — Telephone Encounter (Signed)
Per Dr. Francine Graven via epic secure chat-the bipap titration study was done in July 2024 after I saw her in June. Can they just get a copy of the bipap titration study and be all set   ABG is mentioned in last note.    PCC's, please advise. Thanks

## 2023-07-18 NOTE — Telephone Encounter (Signed)
Pt has an upcoming appt 2/26.

## 2023-07-24 ENCOUNTER — Ambulatory Visit (INDEPENDENT_AMBULATORY_CARE_PROVIDER_SITE_OTHER): Payer: Medicare HMO | Admitting: Pulmonary Disease

## 2023-07-24 VITALS — BP 136/79 | HR 77

## 2023-07-24 DIAGNOSIS — J9611 Chronic respiratory failure with hypoxia: Secondary | ICD-10-CM | POA: Diagnosis not present

## 2023-07-24 DIAGNOSIS — J9612 Chronic respiratory failure with hypercapnia: Secondary | ICD-10-CM | POA: Diagnosis not present

## 2023-07-24 DIAGNOSIS — J432 Centrilobular emphysema: Secondary | ICD-10-CM | POA: Diagnosis not present

## 2023-07-24 DIAGNOSIS — F17210 Nicotine dependence, cigarettes, uncomplicated: Secondary | ICD-10-CM | POA: Diagnosis not present

## 2023-07-24 MED ORDER — IPRATROPIUM-ALBUTEROL 0.5-2.5 (3) MG/3ML IN SOLN: 3.0000 mL | Freq: Three times a day (TID) | RESPIRATORY_TRACT | 11 refills | Status: DC

## 2023-07-24 MED ORDER — ALBUTEROL SULFATE HFA 108 (90 BASE) MCG/ACT IN AERS: 2.0000 | INHALATION_SPRAY | RESPIRATORY_TRACT | 11 refills | Status: DC | PRN

## 2023-07-24 MED ORDER — BUDESONIDE 0.5 MG/2ML IN SUSP: 0.5000 mg | Freq: Two times a day (BID) | RESPIRATORY_TRACT | 11 refills | Status: DC

## 2023-07-24 NOTE — Progress Notes (Addendum)
 Synopsis: Referred in June 2024 for hospital follow up - COPD   Subjective:   PATIENT ID: [name] GENDER: female DOB: Dec 11, 1955, MRN: [id number]  HPI  Chief Complaint  Patient presents with   Follow-up   [name] is a 68 year old woman, daily smoker with history of hypertension and COPD who returns to pulmonary clinic for follow up of chronic hypercapnic respiratory failure.   OV 11/05/22 She was admitted 5/8 to 5/16 for COPD exacerbation and acute hypoxemic respiratory failure. She was weaned off supplemental oxygen prior to discharge. She is using oxygen at night with sleep. She is using trelegy ellipta 1 puff daily and as needed albuterol nebulizer treatments. She reports she did not get prednisone upon discharge from the hospital.   She complains of falling asleep easily and will be forgetful. ABG in hospital showed pH 7.33, pCO2 72, pO2 193. Her serumb bicarb ranges 29-37 indicating chronic hypercapnia.   OV 07/24/2023 She is experiencing difficulties obtaining a CPAP machine and a portable oxygen concentrator due to issues with the oxygen company and insurance. She found the CPAP beneficial during her hospital stay and believes it will help at home as well.  She was hospitalized from December 23 to May 27, 2023, and was discharged on December 27, only to be readmitted shortly after for her COPD.  She is currently using a nebulizer at home and prefers nebulizer solutions over inhalers due to concerns about oral hygiene and potential side effects like thrush. She has stopped using the [facility] inhaler because she forgets to rinse her mouth afterward. She prefers prednisone, noting that she has been given higher doses during emergency visits without experiencing side effects.  She has difficulty gaining weight. She is interested in gaining weight.  She is trying to quit smoking and has reduced her intake to about three cigarettes a day. She is interested in  nicotine lozenges as a cessation aid.  Past Medical History:  Diagnosis Date   Arthritis    Asthma    Chest pain 2006   Chest pain 10/2016   Chronic back pain    Hypertension    Sciatica    Shortness of breath    due to  smoking      Family History  Problem Relation Age of Onset   Heart attack Mother 23   Hypertension Sister    Diabetes type II Sister    Tongue cancer Brother    Heart attack Sister 18   Lung cancer Sister    Hypertension Sister      Social History   Socioeconomic History   Marital status: Widowed    Spouse name: Not on file   Number of children: Not on file   Years of education: Not on file   Highest education level: Not on file  Occupational History   Occupation: Retired  Tobacco Use   Smoking status: Some Days    Current packs/day: 0.10    Average packs/day: 0.1 packs/day for 30.0 years (3.0 ttl pk-yrs)    Types: Cigarettes   Smokeless tobacco: Never   Tobacco comments:    Smoking since teenage years  Vaping Use   Vaping status: Never Used  Substance and Sexual Activity   Alcohol use: No   Drug use: No   Sexual activity: Never  Other Topics Concern   Not on file  Social History Narrative   Great-grandmother   Used to work as a Corporate treasurer, now volunteers w/ free  time at [employer]   Social Drivers of Health   Financial Resource Strain: Low Risk  (10/05/2020)   Overall Financial Resource Strain (CARDIA)    Difficulty of Paying Living Expenses: Not hard at all  Food Insecurity: No Food Insecurity (05/25/2023)   Hunger Vital Sign    Worried About Running Out of Food in the Last Year: Never true    Ran Out of Food in the Last Year: Never true  Transportation Needs: No Transportation Needs (05/25/2023)   PRAPARE - Administrator, Civil Service (Medical): No    Lack of Transportation (Non-Medical): No  Physical Activity: Not on file  Stress: No Stress Concern Present (10/05/2020)   [employer] of Occupational  Health - Occupational Stress Questionnaire    Feeling of Stress : Not at all  Social Connections: Moderately Integrated (05/27/2023)   Social Connection and Isolation Panel [NHANES]    Frequency of Communication with Friends and Family: More than three times a week    Frequency of Social Gatherings with Friends and Family: Three times a week    Attends Religious Services: More than 4 times per year    Active Member of Clubs or Organizations: Yes    Attends Banker Meetings: More than 4 times per year    Marital Status: Widowed  Intimate Partner Violence: Not At Risk (05/25/2023)   Humiliation, Afraid, Rape, and Kick questionnaire    Fear of Current or Ex-Partner: No    Emotionally Abused: No    Physically Abused: No    Sexually Abused: No     No Known Allergies   Outpatient Medications Prior to Visit  Medication Sig Dispense Refill   albuterol (PROVENTIL) (2.5 MG/3ML) 0.083% nebulizer solution Take 3 mLs (2.5 mg total) by nebulization every 6 (six) hours as needed for wheezing or shortness of breath. 300 mL 0   atorvastatin (LIPITOR) 10 MG tablet Take 10 mg by mouth daily.     benzonatate (TESSALON) 100 MG capsule Take 1 capsule (100 mg total) by mouth 3 (three) times daily. 20 capsule 0   busPIRone (BUSPAR) 10 MG tablet Take 1 tablet (10 mg total) by mouth 2 (two) times daily. 30 tablet 0   ibuprofen (ADVIL) 800 MG tablet Take 800 mg by mouth every 6 (six) hours as needed for mild pain (pain score 1-3) or moderate pain (pain score 4-6).     Multiple Vitamin (MULTIVITAMIN) capsule Take 1 capsule by mouth daily.     oxyCODONE-acetaminophen (PERCOCET) 10-325 MG tablet Take 1 tablet by mouth every 4 (four) hours as needed for pain.     polyethylene glycol (MIRALAX / GLYCOLAX) 17 g packet Take 17 g by mouth daily as needed for mild constipation. 14 each 0   zolpidem (AMBIEN) 5 MG tablet Take 5 mg by mouth at bedtime.     albuterol (VENTOLIN HFA) 108 (90 Base) MCG/ACT inhaler  Inhale 2 puffs into the lungs every 4 (four) hours as needed for wheezing or shortness of breath. 18 g 3   mometasone-formoterol (DULERA) 100-5 MCG/ACT AERO Inhale 2 puffs into the lungs 2 (two) times daily. 1 each 0   predniSONE (DELTASONE) 10 MG tablet Take 50mg  daily for 3days,Take 40mg  daily for 3days,Take 30mg  daily for 3days,Take 20mg  daily for 3days,Take 10mg  daily for 3days, then stop 45 tablet 0   No facility-administered medications prior to visit.    Review of Systems  Constitutional:  Positive for malaise/fatigue and weight loss. Negative for chills  and fever.  HENT:  Negative for congestion, sinus pain and sore throat.   Eyes: Negative.   Respiratory:  Positive for cough and shortness of breath. Negative for hemoptysis, sputum production and wheezing.   Cardiovascular:  Negative for chest pain, palpitations, orthopnea, claudication and leg swelling.  Gastrointestinal:  Negative for abdominal pain, heartburn, nausea and vomiting.  Genitourinary: Negative.   Musculoskeletal:  Negative for joint pain and myalgias.  Skin:  Negative for rash.  Neurological:  Negative for weakness.  Endo/Heme/Allergies: Negative.   Psychiatric/Behavioral: Negative.     Objective:   Vitals:   07/24/23 1336  BP: 136/79  Pulse: 77  SpO2: 92%   Physical Exam Constitutional:      General: She is not in acute distress.    Appearance: She is not ill-appearing.  HENT:     Head: Normocephalic and atraumatic.  Eyes:     General: No scleral icterus.    Conjunctiva/sclera: Conjunctivae normal.  Cardiovascular:     Rate and Rhythm: Normal rate and regular rhythm.     Pulses: Normal pulses.     Heart sounds: Normal heart sounds. No murmur heard. Pulmonary:     Effort: Pulmonary effort is normal.     Breath sounds: Normal breath sounds. Decreased air movement present. No wheezing, rhonchi or rales.  Musculoskeletal:     Right lower leg: No edema.     Left lower leg: No edema.  Lymphadenopathy:      Cervical: No cervical adenopathy.  Skin:    General: Skin is warm and dry.  Neurological:     General: No focal deficit present.     Mental Status: She is alert.    CBC    Component Value Date/Time   WBC 6.8 05/27/2023 0326   RBC 3.79 (L) 05/27/2023 0326   HGB 11.1 (L) 05/27/2023 0326   HCT 36.6 05/27/2023 0326   PLT 274 05/27/2023 0326   MCV 96.6 05/27/2023 0326   MCH 29.3 05/27/2023 0326   MCHC 30.3 05/27/2023 0326   RDW 13.9 05/27/2023 0326   LYMPHSABS 2.1 05/21/2023 0939   MONOABS 0.6 05/21/2023 0939   EOSABS 0.0 05/21/2023 0939   BASOSABS 0.0 05/21/2023 0939      Latest Ref Rng & Units 05/27/2023    3:26 AM 05/26/2023    2:46 AM 05/25/2023    2:46 AM  BMP  Glucose 70 - 99 mg/dL 85  83  782   BUN 8 - 23 mg/dL 22  16  13    Creatinine 0.44 - 1.00 mg/dL 9.56  2.13  0.86   Sodium 135 - 145 mmol/L 137  139  136   Potassium 3.5 - 5.1 mmol/L 4.2  4.4  4.1   Chloride 98 - 111 mmol/L 96  97  94   CO2 22 - 32 mmol/L 35  35  34   Calcium 8.9 - 10.3 mg/dL 9.1  9.1  9.3    Chest imaging: CXR 10/06/22 Lungs are hyperexpanded. The lungs are clear without focal pneumonia, edema, pneumothorax or pleural effusion. The cardiopericardial silhouette is within normal limits for size. Bones are diffusely demineralized. Telemetry leads overlie the chest.  PFT:    Latest Ref Rng & Units 11/20/2016    8:41 AM  PFT Results  FVC-Pre L 2.04   FVC-Predicted Pre % 90   FVC-Post L 2.54   FVC-Predicted Post % 113   Pre FEV1/FVC % % 58   Post FEV1/FCV % % 55   FEV1-Pre L  1.18   FEV1-Predicted Pre % 67   FEV1-Post L 1.40   DLCO uncorrected ml/min/mmHg 9.28   DLCO UNC% % 45   DLVA Predicted % 53   TLC L 9.65   TLC % Predicted % 209   RV % Predicted % 384     Labs:  Path:  Echo:  Heart Catheterization:    Bipap Titration Study 12/13/22 IMPRESSIONS - She did well with Bipap 8/4 cm H2O. - Supplemental oxygen was not used during this study. - No snoring was audible  during this study. - 2-lead EKG demonstrated: PVCs - Mild periodic limb movements were observed during this study. Arousals associated with PLMs were significant.   DIAGNOSIS - COPD with Emphysema - Chronic Respiratory Failure - Periodic Limb Movement During Sleep   RECOMMENDATIONS - Trial of BiPAP therapy on 8/4 cm H2O with a Small size Resmed Full Face AirFit F10 mask and heated humidification. - Assess for the presence of restless leg syndrome. - Avoid alcohol, sedatives and other CNS depressants that may worsen sleep apnea and disrupt normal sleep architecture. - Sleep hygiene should be reviewed to assess factors that may improve sleep quality. - Weight management and regular exercise should be initiated or continued.  Assessment & Plan:   Centrilobular emphysema (HCC) - Plan: albuterol (VENTOLIN HFA) 108 (90 Base) MCG/ACT inhaler, budesonide (PULMICORT) 0.5 MG/2ML nebulizer solution, ipratropium-albuterol (DUONEB) 0.5-2.5 (3) MG/3ML SOLN, Ambulatory Referral for DME  Chronic hypercapnic respiratory failure (HCC) - Plan: Ambulatory Referral for DME  Discussion: [name] is a 68 year old woman, daily smoker with history of hypertension and COPD who returns to pulmonary clinic for follow up of chronic hypercapnic respiratory failure.    Chronic Obstructive Pulmonary Disease (COPD) Patient reports difficulty with breathing and weight loss, likely secondary to increased energy expenditure from labored breathing. Patient is currently using Albuterol nebulizer solution and has had difficulty with adherence to [facility] inhaler due to concerns about oral thrush. Patient has expressed interest in oral Prednisone for symptom management. -Discontinue Dulera inhaler. -Start Budesonide nebulizer solution twice daily. -Start Duoneb nebulizer solution three times daily. -Consider Prednisone for acute exacerbations only due to long-term side effects. -Encourage high protein diet and use of  nutritional supplements like Boost or Ensure for weight gain.  Chronic Hypercapnic and Hypoxemic Respiratory Failure Patient had bipap titration study 11/2022 and she reported benefit from CPAP while in the hospital. ABG 10/06/22 confirms chronic hypercapnia pH 7.33, pCO2 72. Her serum bicarb levels range 33-37 over the past year indicating chronic hypercapnia.  - order NIV - Patients condition quickly deteriorates without ventilator. Removal of the ventilator may cause serious harm to the patient, exacerbation of condition and hospital readmission. Bilevel/RAD has been tried and failed to maintain or stabilize the patient. Bilevel cannot meet current volume requirements. Patient requires frequent durations of ventilatory support. Intermittent usage is insufficient.  -Patient continues to exhibit signs of hypercarbia associated with chronic respiratory failure secondary to severe COPD/emphysema.  Patient requires the use of NIV both nightly and daytime to help with exacerbation periods.  The use of NIV will treat the patient's high PCO2 levels and can reduce risk of exacerbations and future hospitalizations when used at night and during the day.   - She qualifies for supplemental oxygen with exertion, oxygen orders placed for POC.   Tobacco Use Patient reports smoking three cigarettes per day and is attempting to quit. -Suggest nicotine lozenges as a smoking cessation aid. -discussed for 3 minutes  Follow-up in  3 months to assess response to new nebulizer regimen, weight status, and progress with smoking cessation.  Regina Comas, MD [facility] Office: [phone]     Current Outpatient Medications:    albuterol (PROVENTIL) (2.5 MG/3ML) 0.083% nebulizer solution, Take 3 mLs (2.5 mg total) by nebulization every 6 (six) hours as needed for wheezing or shortness of breath., Disp: 300 mL, Rfl: 0   atorvastatin (LIPITOR) 10 MG tablet, Take 10 mg by mouth daily., Disp: ,  Rfl:    benzonatate (TESSALON) 100 MG capsule, Take 1 capsule (100 mg total) by mouth 3 (three) times daily., Disp: 20 capsule, Rfl: 0   budesonide (PULMICORT) 0.5 MG/2ML nebulizer solution, Take 2 mLs (0.5 mg total) by nebulization 2 (two) times daily., Disp: 120 mL, Rfl: 11   busPIRone (BUSPAR) 10 MG tablet, Take 1 tablet (10 mg total) by mouth 2 (two) times daily., Disp: 30 tablet, Rfl: 0   ibuprofen (ADVIL) 800 MG tablet, Take 800 mg by mouth every 6 (six) hours as needed for mild pain (pain score 1-3) or moderate pain (pain score 4-6)., Disp: , Rfl:    ipratropium-albuterol (DUONEB) 0.5-2.5 (3) MG/3ML SOLN, Take 3 mLs by nebulization 3 (three) times daily., Disp: 360 mL, Rfl: 11   Multiple Vitamin (MULTIVITAMIN) capsule, Take 1 capsule by mouth daily., Disp: , Rfl:    oxyCODONE-acetaminophen (PERCOCET) 10-325 MG tablet, Take 1 tablet by mouth every 4 (four) hours as needed for pain., Disp: , Rfl:    polyethylene glycol (MIRALAX / GLYCOLAX) 17 g packet, Take 17 g by mouth daily as needed for mild constipation., Disp: 14 each, Rfl: 0   zolpidem (AMBIEN) 5 MG tablet, Take 5 mg by mouth at bedtime., Disp: , Rfl:    albuterol (VENTOLIN HFA) 108 (90 Base) MCG/ACT inhaler, Inhale 2 puffs into the lungs every 4 (four) hours as needed for wheezing or shortness of breath., Disp: 18 g, Rfl: 11

## 2023-07-24 NOTE — Patient Instructions (Addendum)
 Morning - take budesonide nebulizer treatment - take Duoneb nebulizer treatment  Noon Time - take duoneb nebulizer treatment  Evening Time - take budesonide nebulizer treatment - take duoneb nebulizer treatment  We are working on getting you setup for a bipap machine  We will walk you today to evaluate if you need a portable oxygen concentrator  Follow up in 3 months, call sooner if needed

## 2023-07-26 ENCOUNTER — Encounter: Payer: Self-pay | Admitting: Pulmonary Disease

## 2023-08-09 NOTE — Telephone Encounter (Signed)
 New, [name], Pietro Cassis; Angus Seller, [facility],  Yes, per notes we need the original diagnstic sleep study. We have a copy of the titration but need the original to process. It also look like we are in need of some updated insurance info as well.  I looked in the EMR and do not see a copy of the original sleep study, so did Marisol.   Thank you,  Luellen Pucker

## 2023-08-12 NOTE — Telephone Encounter (Signed)
 PLEASE READ MY OFFICE NOTES!  PATIENT HAS NOT HAD A SLEEP STUDY FOR SLEEP APNEA. HER BIPAP IS FOR HYPERCAPNIC RESPIRATORY FAILURE. THAT IS HER QUALIFICATION FOR BIPAP.  Do I need to speak with Luellen Pucker on the phone?  JD

## 2023-08-12 NOTE — Telephone Encounter (Signed)
 ATC Brad w/ [employer]. Lvmtcb.

## 2023-08-13 NOTE — Telephone Encounter (Signed)
 Spoke w/ Clovis Riley from [employer], w/ this pts COPD & Chronic respiratory failure being the diagnosis reason for Bipap it has certain requirements that have to be met. ABG 52 or higher, which pt did qualify for. Pt will need ONO on 2L and would have to desaturate for 5 mins for insurance to cover. Another option available is NIV & pt could have it by the end of day today. Marthann Schiller is available to speak w/ Dr. Francine Graven, contact number is [phone]

## 2023-08-13 NOTE — Telephone Encounter (Signed)
 Spoke w/ Brad from [employer]. [name] is going to resubmit the dme order changing the diagnosis to Hypercapnic Respiratory Failure. He is also going to see if he can get it processed asap.

## 2023-08-14 NOTE — Telephone Encounter (Signed)
 Please have them order NIV then.  Thanks, JD

## 2023-08-14 NOTE — Telephone Encounter (Signed)
 Spoke with Mitch over at [employer] he is going top fax [place] over paperwork for Dr.Dewald to fill out and send back for patient to get NIV Bipap.  Will keep this encounter open until we receive paperwork and fax it back to [employer].

## 2023-08-16 NOTE — Telephone Encounter (Signed)
 Spoke w/ Marthann Schiller he email me the paperwork  in cabinet ready to be signed.

## 2023-08-20 NOTE — Telephone Encounter (Signed)
 Paperwork has been signed and faxed back to [place] of [employer]

## 2023-08-26 ENCOUNTER — Telehealth: Payer: Self-pay

## 2023-08-26 NOTE — Telephone Encounter (Signed)
 Spoke w/ Mitch of [employer], they have been trying to call [name] for her NIV ( was approved) copay. Have called 3/4 times past few days and has left message.

## 2023-08-29 ENCOUNTER — Telehealth: Payer: Self-pay

## 2023-08-29 NOTE — Telephone Encounter (Signed)
 Copied from CRM [id number]. Topic: General - Billing Inquiry >> Aug 29, 2023  2:22 PM Tianna S wrote: Reason for CRM: mitch from [employer] is calling to speak with Aleesa Sweigert in regards to patient wanting to file for hardship, they could not get in touch with patient, please call [phone] for further information    Spoke w/ Mitch and he will be going to [place]. [place] to fill out the requested paperwork

## 2023-09-04 ENCOUNTER — Emergency Department (HOSPITAL_COMMUNITY)

## 2023-09-04 ENCOUNTER — Inpatient Hospital Stay (HOSPITAL_COMMUNITY)
Admission: EM | Admit: 2023-09-04 | Discharge: 2023-09-06 | DRG: 189 | Disposition: A | Discharge status: Home / Self Care | Attending: Internal Medicine | Admitting: Internal Medicine

## 2023-09-04 ENCOUNTER — Encounter (HOSPITAL_COMMUNITY): Payer: Self-pay | Admitting: Emergency Medicine

## 2023-09-04 ENCOUNTER — Other Ambulatory Visit: Payer: Self-pay

## 2023-09-04 DIAGNOSIS — J441 Chronic obstructive pulmonary disease with (acute) exacerbation: Secondary | ICD-10-CM | POA: Diagnosis present

## 2023-09-04 DIAGNOSIS — G8929 Other chronic pain: Secondary | ICD-10-CM | POA: Diagnosis present

## 2023-09-04 DIAGNOSIS — Z91199 Patient's noncompliance with other medical treatment and regimen due to unspecified reason: Secondary | ICD-10-CM | POA: Diagnosis not present

## 2023-09-04 DIAGNOSIS — E43 Unspecified severe protein-calorie malnutrition: Secondary | ICD-10-CM | POA: Diagnosis present

## 2023-09-04 DIAGNOSIS — Z716 Tobacco abuse counseling: Secondary | ICD-10-CM

## 2023-09-04 DIAGNOSIS — Z96642 Presence of left artificial hip joint: Secondary | ICD-10-CM | POA: Diagnosis present

## 2023-09-04 DIAGNOSIS — Z7951 Long term (current) use of inhaled steroids: Secondary | ICD-10-CM

## 2023-09-04 DIAGNOSIS — J9622 Acute and chronic respiratory failure with hypercapnia: Secondary | ICD-10-CM | POA: Diagnosis present

## 2023-09-04 DIAGNOSIS — Z681 Body mass index (BMI) 19 or less, adult: Secondary | ICD-10-CM

## 2023-09-04 DIAGNOSIS — M1990 Unspecified osteoarthritis, unspecified site: Secondary | ICD-10-CM | POA: Diagnosis present

## 2023-09-04 DIAGNOSIS — F17210 Nicotine dependence, cigarettes, uncomplicated: Secondary | ICD-10-CM | POA: Diagnosis present

## 2023-09-04 DIAGNOSIS — Z8249 Family history of ischemic heart disease and other diseases of the circulatory system: Secondary | ICD-10-CM | POA: Diagnosis not present

## 2023-09-04 DIAGNOSIS — I10 Essential (primary) hypertension: Secondary | ICD-10-CM | POA: Diagnosis present

## 2023-09-04 DIAGNOSIS — E876 Hypokalemia: Secondary | ICD-10-CM | POA: Diagnosis present

## 2023-09-04 DIAGNOSIS — J9621 Acute and chronic respiratory failure with hypoxia: Principal | ICD-10-CM

## 2023-09-04 DIAGNOSIS — K219 Gastro-esophageal reflux disease without esophagitis: Secondary | ICD-10-CM | POA: Diagnosis present

## 2023-09-04 DIAGNOSIS — Z79899 Other long term (current) drug therapy: Secondary | ICD-10-CM | POA: Diagnosis not present

## 2023-09-04 DIAGNOSIS — R0689 Other abnormalities of breathing: Secondary | ICD-10-CM

## 2023-09-04 DIAGNOSIS — J449 Chronic obstructive pulmonary disease, unspecified: Secondary | ICD-10-CM | POA: Diagnosis present

## 2023-09-04 DIAGNOSIS — R0603 Acute respiratory distress: Principal | ICD-10-CM

## 2023-09-04 DIAGNOSIS — J9611 Chronic respiratory failure with hypoxia: Secondary | ICD-10-CM

## 2023-09-04 DIAGNOSIS — R0789 Other chest pain: Secondary | ICD-10-CM | POA: Diagnosis present

## 2023-09-04 DIAGNOSIS — Z720 Tobacco use: Secondary | ICD-10-CM | POA: Diagnosis present

## 2023-09-04 DIAGNOSIS — Z90710 Acquired absence of both cervix and uterus: Secondary | ICD-10-CM

## 2023-09-04 LAB — BASIC METABOLIC PANEL WITH GFR
Anion gap: 10 (ref 5–15)
BUN: 14 mg/dL (ref 8–23)
CO2: 29 mmol/L (ref 22–32)
Calcium: 9.2 mg/dL (ref 8.9–10.3)
Chloride: 102 mmol/L (ref 98–111)
Creatinine, Ser: 0.63 mg/dL (ref 0.44–1.00)
GFR, Estimated: >60 mL/min (ref >60)
Glucose, Bld: 101 mg/dL — ABNORMAL HIGH (ref 70–99)
Potassium: 3.4 mmol/L — ABNORMAL LOW (ref 3.5–5.1)
Sodium: 141 mmol/L (ref 135–145)

## 2023-09-04 LAB — CBC WITH DIFFERENTIAL/PLATELET
Abs Immature Granulocytes: 0.01 10*3/uL (ref 0.00–0.07)
Basophils Absolute: 0 10*3/uL (ref 0.0–0.1)
Basophils Relative: 0 %
Eosinophils Absolute: 0.2 10*3/uL (ref 0.0–0.5)
Eosinophils Relative: 2 %
HCT: 41.4 % (ref 36.0–46.0)
Hemoglobin: 12.6 g/dL (ref 12.0–15.0)
Immature Granulocytes: 0 %
Lymphocytes Relative: 51 %
Lymphs Abs: 3.5 10*3/uL (ref 0.7–4.0)
MCH: 29.6 pg (ref 26.0–34.0)
MCHC: 30.4 g/dL (ref 30.0–36.0)
MCV: 97.4 fL (ref 80.0–100.0)
Monocytes Absolute: 0.5 10*3/uL (ref 0.1–1.0)
Monocytes Relative: 7 %
Neutro Abs: 2.8 10*3/uL (ref 1.7–7.7)
Neutrophils Relative %: 40 %
Platelets: 308 10*3/uL (ref 150–400)
RBC: 4.25 MIL/uL (ref 3.87–5.11)
RDW: 13.9 % (ref 11.5–15.5)
WBC: 7 10*3/uL (ref 4.0–10.5)
nRBC: 0 % (ref 0.0–0.2)

## 2023-09-04 LAB — I-STAT CG4 LACTIC ACID, ED: Lactic Acid, Venous: 0.7 mmol/L (ref 0.5–1.9)

## 2023-09-04 LAB — BRAIN NATRIURETIC PEPTIDE: B Natriuretic Peptide: 24.3 pg/mL (ref 0.0–100.0)

## 2023-09-04 LAB — BLOOD GAS, VENOUS
Acid-Base Excess: 8.3 mmol/L — ABNORMAL HIGH (ref 0.0–2.0)
Bicarbonate: 36.9 mmol/L — ABNORMAL HIGH (ref 20.0–28.0)
O2 Saturation: 36.1 %
Patient temperature: 37
pCO2, Ven: 70 mmHg — ABNORMAL HIGH (ref 44–60)
pH, Ven: 7.33 (ref 7.25–7.43)
pO2, Ven: <31 mmHg — CL (ref 32–45)

## 2023-09-04 LAB — TROPONIN I (HIGH SENSITIVITY): Troponin I (High Sensitivity): 5 ng/L (ref <18)

## 2023-09-04 MED ORDER — METHYLPREDNISOLONE SODIUM SUCC 125 MG IJ SOLR: 125.0000 mg | Freq: Once | INTRAMUSCULAR | Status: AC

## 2023-09-04 MED ORDER — ALBUTEROL SULFATE (2.5 MG/3ML) 0.083% IN NEBU
10.0000 mg/h | INHALATION_SOLUTION | RESPIRATORY_TRACT | Status: DC
  Administered 2023-09-04: 10 mg/h via RESPIRATORY_TRACT

## 2023-09-04 MED ORDER — METHYLPREDNISOLONE SODIUM SUCC 125 MG IJ SOLR
INTRAMUSCULAR | Status: AC
Start: 2023-09-04 — End: 2023-09-04
  Administered 2023-09-04: 125 mg via INTRAVENOUS
  Filled 2023-09-04: qty 2

## 2023-09-04 MED ORDER — MAGNESIUM SULFATE 2 GM/50ML IV SOLN: 2.0000 g | Freq: Once | INTRAVENOUS | Status: AC

## 2023-09-04 MED ORDER — KETOROLAC TROMETHAMINE 15 MG/ML IJ SOLN
15.0000 mg | Freq: Once | INTRAMUSCULAR | Status: AC
  Administered 2023-09-04: 15 mg via INTRAVENOUS
  Filled 2023-09-04: qty 1

## 2023-09-04 MED ORDER — MAGNESIUM SULFATE 2 GM/50ML IV SOLN
INTRAVENOUS | Status: AC
  Administered 2023-09-04: 2 g via INTRAVENOUS
  Filled 2023-09-04: qty 50

## 2023-09-04 MED ORDER — ALBUTEROL SULFATE (2.5 MG/3ML) 0.083% IN NEBU
INHALATION_SOLUTION | RESPIRATORY_TRACT | Status: AC
  Filled 2023-09-04: qty 12

## 2023-09-04 NOTE — ED Provider Notes (Signed)
  EMERGENCY DEPARTMENT AT [facility] Provider Note   CSN: [id number] Arrival date & time: 09/04/23  2144     History  Chief Complaint  Patient presents with   Respiratory Distress    [name] is a 68 y.o. female.  Patient is a 68 year old female with past medical history of asthma, COPD, and current tobacco user presenting for shortness of breath.  Patient called out to EMS for complaints of chest pain.  When they arrived she was found to be wheezing in all lung fields, 90% on room air, with abdominal breathing, retractions, nasal flaring.  She was placed on CPAP and brought urgency department.  She received DuoNebs x 2 and route.  Patient denies any fevers, chills, coughing.  Admits to wheezing.  Still currently smoking " some days".  Denies any weight gain, orthopnea, or lower extremity edema.  The history is provided by the patient. No language interpreter was used.       Home Medications Prior to Admission medications   Medication Sig Start Date End Date Taking? Authorizing Provider  albuterol (PROVENTIL) (2.5 MG/3ML) 0.083% nebulizer solution Take 3 mLs (2.5 mg total) by nebulization every 6 (six) hours as needed for wheezing or shortness of breath. 05/27/23   Rolly Salter, MD  albuterol (VENTOLIN HFA) 108 (90 Base) MCG/ACT inhaler Inhale 2 puffs into the lungs every 4 (four) hours as needed for wheezing or shortness of breath. 07/24/23   Martina Sinner, MD  atorvastatin (LIPITOR) 10 MG tablet Take 10 mg by mouth daily. 03/20/23   [provider]  benzonatate (TESSALON) 100 MG capsule Take 1 capsule (100 mg total) by mouth 3 (three) times daily. 05/27/23   Rolly Salter, MD  budesonide (PULMICORT) 0.5 MG/2ML nebulizer solution Take 2 mLs (0.5 mg total) by nebulization 2 (two) times daily. 07/24/23   Martina Sinner, MD  busPIRone (BUSPAR) 10 MG tablet Take 1 tablet (10 mg total) by mouth 2 (two) times daily. 05/27/23   Rolly Salter,  MD  ibuprofen (ADVIL) 800 MG tablet Take 800 mg by mouth every 6 (six) hours as needed for mild pain (pain score 1-3) or moderate pain (pain score 4-6). 05/06/23   [provider]  ipratropium-albuterol (DUONEB) 0.5-2.5 (3) MG/3ML SOLN Take 3 mLs by nebulization 3 (three) times daily. 07/24/23   Martina Sinner, MD  Multiple Vitamin (MULTIVITAMIN) capsule Take 1 capsule by mouth daily.    [provider]  oxyCODONE-acetaminophen (PERCOCET) 10-325 MG tablet Take 1 tablet by mouth every 4 (four) hours as needed for pain. 12/23/20   [provider]  polyethylene glycol (MIRALAX / GLYCOLAX) 17 g packet Take 17 g by mouth daily as needed for mild constipation. 05/27/23   Rolly Salter, MD  zolpidem (AMBIEN) 5 MG tablet Take 5 mg by mouth at bedtime. 12/25/20   [provider]      Allergies    Patient has no known allergies.    Review of Systems   Review of Systems  Constitutional:  Negative for chills and fever.  HENT:  Negative for ear pain and sore throat.   Eyes:  Negative for pain and visual disturbance.  Respiratory:  Positive for shortness of breath and wheezing. Negative for cough.   Cardiovascular:  Negative for chest pain and palpitations.  Gastrointestinal:  Negative for abdominal pain and vomiting.  Genitourinary:  Negative for dysuria and hematuria.  Musculoskeletal:  Negative for arthralgias and back pain.  Skin:  Negative for color change and rash.  Neurological:  Negative for seizures and syncope.  All other systems reviewed and are negative.   Physical Exam Updated Vital Signs BP 104/69 (BP Location: Right Arm)   Pulse 70   Temp 98.2 F (36.8 C) (Axillary)   Resp 18   Ht 5\' 1"  (1.549 m)   Wt 43 kg   SpO2 98%   BMI 17.91 kg/m  Physical Exam Vitals and nursing note reviewed.  Constitutional:      General: She is not in acute distress.    Appearance: She is well-developed.  HENT:     Head: Normocephalic and atraumatic.  Eyes:      Conjunctiva/sclera: Conjunctivae normal.  Cardiovascular:     Rate and Rhythm: Normal rate and regular rhythm.     Heart sounds: No murmur heard. Pulmonary:     Effort: Tachypnea, accessory muscle usage and respiratory distress present.     Breath sounds: Examination of the right-upper field reveals wheezing. Examination of the left-upper field reveals wheezing. Examination of the right-middle field reveals wheezing. Examination of the left-middle field reveals wheezing. Examination of the right-lower field reveals wheezing. Examination of the left-lower field reveals wheezing. Wheezing present.  Abdominal:     Palpations: Abdomen is soft.     Tenderness: There is no abdominal tenderness.  Musculoskeletal:        General: No swelling.     Cervical back: Neck supple.  Skin:    General: Skin is warm and dry.     Capillary Refill: Capillary refill takes less than 2 seconds.  Neurological:     Mental Status: She is alert.  Psychiatric:        Mood and Affect: Mood normal.     ED Results / Procedures / Treatments   Labs (all labs ordered are listed, but only abnormal results are displayed) Labs Reviewed  BASIC METABOLIC PANEL WITH GFR - Abnormal; Notable for the following components:      Result Value   Potassium 3.4 (*)    Glucose, Bld 101 (*)    All other components within normal limits  BLOOD GAS, VENOUS - Abnormal; Notable for the following components:   pCO2, Ven 70 (*)    pO2, Ven <31 (*)    Bicarbonate 36.9 (*)    Acid-Base Excess 8.3 (*)    All other components within normal limits  CBC WITH DIFFERENTIAL/PLATELET  BRAIN NATRIURETIC PEPTIDE  I-STAT CG4 LACTIC ACID, ED  TROPONIN I (HIGH SENSITIVITY)    EKG None  Radiology No results found.  Procedures .Critical Care  Performed by: Franne Forts, DO Authorized by: Franne Forts, DO   Critical care provider statement:    Critical care time (minutes):  101   Critical care was necessary to treat or prevent  imminent or life-threatening deterioration of the following conditions:  Respiratory failure   Critical care was time spent personally by me on the following activities:  Development of treatment plan with patient or surrogate, discussions with consultants, evaluation of patient's response to treatment, examination of patient, ordering and review of laboratory studies, ordering and review of radiographic studies, ordering and performing treatments and interventions, pulse oximetry, re-evaluation of patient's condition and review of old charts   Care discussed with: admitting provider       Medications Ordered in ED Medications  albuterol (PROVENTIL) (2.5 MG/3ML) 0.083% nebulizer solution (10 mg/hr Nebulization New Bag/Given 09/04/23 2158)  albuterol (PROVENTIL) (2.5 MG/3ML) 0.083% nebulizer solution (  Not Given 09/04/23 2200)  magnesium sulfate IVPB 2 g 50 mL (0 g Intravenous Stopped 09/04/23 2301)  methylPREDNISolone sodium succinate (SOLU-MEDROL) 125 mg/2 mL injection 125 mg (125 mg Intravenous Given 09/04/23 2151)  ketorolac (TORADOL) 15 MG/ML injection 15 mg (15 mg Intravenous Given 09/04/23 2240)    ED Course/ Medical Decision Making/ A&P                                 Medical Decision Making Amount and/or Complexity of Data Reviewed Labs: ordered. Radiology: ordered.  Risk Prescription drug management. Decision regarding hospitalization.   27:44 PM 68 year old female with past medical history of asthma, COPD, and current tobacco user presenting for shortness of breath.  Patient is in acute respiratory distress with retractions, tachypnea, and abdominal breathing.  Wheezing in all lung fields concerning for COPD exacerbation.  Currently at 96% on BiPAP.  Continuous albuterol given.  Solu-Medrol 125 mg IV given.  Magnesium given.  Chest x-ray demonstrates no focal opacities concerning for pneumonia.  No leukocytosis or signs or symptoms of sepsis.  No lactic acidosis.  VBG concerning for  hypercarbia will continue BiPAP.  Recommended for admission for the above reasons.  Dr. Mariea Clonts accepting.        Final Clinical Impression(s) / ED Diagnoses Final diagnoses:  Respiratory distress  COPD exacerbation (HCC)  Hypercarbia    Rx / DC Orders ED Discharge Orders     None         Franne Forts, DO 09/04/23 2307

## 2023-09-04 NOTE — ED Triage Notes (Signed)
 Pt arrives via EMS from home with resp distress. HX COPD. Pt on CPAP en route. EMS report 90% RA at home.

## 2023-09-05 ENCOUNTER — Encounter (HOSPITAL_COMMUNITY): Payer: Self-pay | Admitting: Family Medicine

## 2023-09-05 ENCOUNTER — Inpatient Hospital Stay (HOSPITAL_COMMUNITY)

## 2023-09-05 DIAGNOSIS — J441 Chronic obstructive pulmonary disease with (acute) exacerbation: Secondary | ICD-10-CM | POA: Diagnosis not present

## 2023-09-05 MED ORDER — OXYCODONE HCL 5 MG PO TABS
5.0000 mg | ORAL_TABLET | ORAL | Status: DC | PRN
  Administered 2023-09-05 – 2023-09-06 (×7): 5 mg via ORAL
  Filled 2023-09-05 (×8): qty 1

## 2023-09-05 MED ORDER — ZOLPIDEM TARTRATE 5 MG PO TABS
5.0000 mg | ORAL_TABLET | Freq: Every evening | ORAL | Status: DC | PRN
  Administered 2023-09-05: 5 mg via ORAL
  Filled 2023-09-05: qty 1

## 2023-09-05 MED ORDER — NICOTINE 14 MG/24HR TD PT24
14.0000 mg | MEDICATED_PATCH | Freq: Every day | TRANSDERMAL | Status: DC
  Administered 2023-09-05: 14 mg via TRANSDERMAL
  Filled 2023-09-05: qty 1

## 2023-09-05 MED ORDER — OXYCODONE-ACETAMINOPHEN 10-325 MG PO TABS: 1.0000 | ORAL_TABLET | ORAL | Status: DC | PRN

## 2023-09-05 MED ORDER — SODIUM CHLORIDE 0.9% FLUSH: 3.0000 mL | INTRAVENOUS | Status: DC | PRN

## 2023-09-05 MED ORDER — POTASSIUM CHLORIDE CRYS ER 20 MEQ PO TBCR
40.0000 meq | EXTENDED_RELEASE_TABLET | ORAL | Status: AC
Start: 2023-09-05 — End: 2023-09-05
  Administered 2023-09-05 (×2): 40 meq via ORAL
  Filled 2023-09-05 (×2): qty 2

## 2023-09-05 MED ORDER — TRAZODONE HCL 50 MG PO TABS: 50.0000 mg | ORAL_TABLET | Freq: Every evening | ORAL | Status: DC | PRN

## 2023-09-05 MED ORDER — ONDANSETRON HCL 4 MG/2ML IJ SOLN: 4.0000 mg | Freq: Four times a day (QID) | INTRAMUSCULAR | Status: DC | PRN

## 2023-09-05 MED ORDER — SODIUM CHLORIDE 0.9% FLUSH
3.0000 mL | Freq: Two times a day (BID) | INTRAVENOUS | Status: DC
  Administered 2023-09-05 – 2023-09-06 (×3): 3 mL via INTRAVENOUS

## 2023-09-05 MED ORDER — ATORVASTATIN CALCIUM 10 MG PO TABS
10.0000 mg | ORAL_TABLET | Freq: Every day | ORAL | Status: DC
  Administered 2023-09-05 – 2023-09-06 (×2): 10 mg via ORAL
  Filled 2023-09-05 (×2): qty 1

## 2023-09-05 MED ORDER — BISACODYL 10 MG RE SUPP: 10.0000 mg | Freq: Every day | RECTAL | Status: DC | PRN

## 2023-09-05 MED ORDER — ACETAMINOPHEN 325 MG PO TABS
650.0000 mg | ORAL_TABLET | Freq: Four times a day (QID) | ORAL | Status: DC | PRN
  Administered 2023-09-05: 650 mg via ORAL
  Filled 2023-09-05: qty 2

## 2023-09-05 MED ORDER — PANTOPRAZOLE SODIUM 40 MG PO TBEC
40.0000 mg | DELAYED_RELEASE_TABLET | Freq: Every day | ORAL | Status: DC
Start: 2023-09-05 — End: 2023-09-06
  Administered 2023-09-05 – 2023-09-06 (×2): 40 mg via ORAL
  Filled 2023-09-05 (×2): qty 1

## 2023-09-05 MED ORDER — POLYETHYLENE GLYCOL 3350 17 G PO PACK: 17.0000 g | PACK | Freq: Every day | ORAL | Status: DC | PRN

## 2023-09-05 MED ORDER — ACETAMINOPHEN 650 MG RE SUPP: 650.0000 mg | Freq: Four times a day (QID) | RECTAL | Status: DC | PRN

## 2023-09-05 MED ORDER — OXYCODONE HCL 5 MG PO TABS
10.0000 mg | ORAL_TABLET | Freq: Once | ORAL | Status: AC
  Administered 2023-09-05: 10 mg via ORAL
  Filled 2023-09-05: qty 2

## 2023-09-05 MED ORDER — IOHEXOL 300 MG/ML  SOLN
100.0000 mL | Freq: Once | INTRAMUSCULAR | Status: AC | PRN
  Administered 2023-09-05: 100 mL via INTRAVENOUS

## 2023-09-05 MED ORDER — MIRTAZAPINE 15 MG PO TABS
7.5000 mg | ORAL_TABLET | Freq: Every day | ORAL | Status: DC
  Administered 2023-09-05 (×2): 7.5 mg via ORAL
  Filled 2023-09-05 (×2): qty 1

## 2023-09-05 MED ORDER — ONDANSETRON HCL 4 MG PO TABS: 4.0000 mg | ORAL_TABLET | Freq: Four times a day (QID) | ORAL | Status: DC | PRN

## 2023-09-05 MED ORDER — IPRATROPIUM-ALBUTEROL 0.5-2.5 (3) MG/3ML IN SOLN
3.0000 mL | Freq: Four times a day (QID) | RESPIRATORY_TRACT | Status: DC
  Administered 2023-09-05 – 2023-09-06 (×6): 3 mL via RESPIRATORY_TRACT
  Filled 2023-09-05 (×7): qty 3

## 2023-09-05 MED ORDER — DOXYCYCLINE HYCLATE 100 MG PO TABS
100.0000 mg | ORAL_TABLET | Freq: Two times a day (BID) | ORAL | Status: DC
  Administered 2023-09-05 – 2023-09-06 (×4): 100 mg via ORAL
  Filled 2023-09-05 (×4): qty 1

## 2023-09-05 MED ORDER — SODIUM CHLORIDE 0.9 % IV SOLN: INTRAVENOUS | Status: AC | PRN

## 2023-09-05 MED ORDER — METHYLPREDNISOLONE SODIUM SUCC 40 MG IJ SOLR
40.0000 mg | Freq: Two times a day (BID) | INTRAMUSCULAR | Status: DC
  Administered 2023-09-05 – 2023-09-06 (×3): 40 mg via INTRAVENOUS
  Filled 2023-09-05 (×3): qty 1

## 2023-09-05 MED ORDER — OXYCODONE-ACETAMINOPHEN 5-325 MG PO TABS
1.0000 | ORAL_TABLET | ORAL | Status: DC | PRN
  Administered 2023-09-05 – 2023-09-06 (×8): 1 via ORAL
  Filled 2023-09-05 (×9): qty 1

## 2023-09-05 MED ORDER — SODIUM CHLORIDE 0.9% FLUSH
3.0000 mL | Freq: Two times a day (BID) | INTRAVENOUS | Status: DC
  Administered 2023-09-05 (×2): 3 mL via INTRAVENOUS

## 2023-09-05 MED ORDER — HEPARIN SODIUM (PORCINE) 5000 UNIT/ML IJ SOLN
5000.0000 [IU] | Freq: Three times a day (TID) | INTRAMUSCULAR | Status: DC
  Administered 2023-09-05 – 2023-09-06 (×4): 5000 [IU] via SUBCUTANEOUS
  Filled 2023-09-05 (×4): qty 1

## 2023-09-05 MED ORDER — SODIUM CHLORIDE (PF) 0.9 % IJ SOLN
INTRAMUSCULAR | Status: AC
  Filled 2023-09-05: qty 50

## 2023-09-05 MED ORDER — DM-GUAIFENESIN ER 30-600 MG PO TB12
1.0000 | ORAL_TABLET | Freq: Two times a day (BID) | ORAL | Status: DC
  Administered 2023-09-05 – 2023-09-06 (×4): 1 via ORAL
  Filled 2023-09-05 (×4): qty 1

## 2023-09-05 MED ORDER — ALBUTEROL SULFATE (2.5 MG/3ML) 0.083% IN NEBU
2.5000 mg | INHALATION_SOLUTION | RESPIRATORY_TRACT | Status: DC | PRN
  Administered 2023-09-06: 2.5 mg via RESPIRATORY_TRACT
  Filled 2023-09-05: qty 3

## 2023-09-05 NOTE — Plan of Care (Signed)

## 2023-09-05 NOTE — H&P (Signed)
 History and Physical    Patient: [name]:[id number] DOB: 1955-09-11 DOA: 09/04/2023 DOS: the patient was seen and examined on 09/04/2023 PCP: Karenann Cai, NP  Patient coming from: Home  Chief Complaint:  Chief Complaint  Patient presents with   Respiratory Distress   HPI: [name] is a 68 y.o. female with medical history significant of ongoing tobacco abuse, COPD with chronic hypoxic respiratory failure uses up to 4 L of oxygen at home as needed, HTN, GERD who presents by EMS on 09/04/2023 with dyspnea, chest pain and hypoxia -- Per EMS patient was very very wheezy with increased work of breathing and required CPAP -Patient admits to noncompliance with oxygen at home and admits to ongoing tobacco use -No sick contacts at home -No fever  Or chills   No Nausea, Vomiting or Diarrhea -- Chest pain was not exertional, it was not pleuritic, no leg pains or leg swelling -- EDP reports very poor air movement, wheezes and increased work of breathing initially--- patient received back-to-back nebulizer treatments, steroids and was placed on BiPAP -At the time of my evaluation patient was off BiPAP, had some conversational dyspnea, she was not hypoxic  - Chest x-ray without acute findings, BNP is only 24.3, EKG sinus rhythm -Troponin is 5, VBG with a pH of 7.33 pCO2 of 70 bicarb of 36.9--review of record reveals that patient pCO2 is usually around 70 for the most part -Potassium is 3.4 sodium is 141 creatinine 0.63 -CBC is unremarkable, lactic acid 0.7  Review of Systems: As mentioned in the history of present illness. All other systems reviewed and are negative. Past Medical History:  Diagnosis Date   Arthritis    Asthma    Chest pain 2006   Chest pain 10/2016   Chronic back pain    Hypertension    Sciatica    Shortness of breath    due to  smoking    Past Surgical History:  Procedure Laterality Date   ABDOMINAL HYSTERECTOMY  1997   CARDIAC CATHETERIZATION   2006   HEMI-MICRODISCECTOMY LUMBAR LAMINECTOMY LEVEL 1  05/14/2012   Procedure: HEMI-MICRODISCECTOMY LUMBAR LAMINECTOMY LEVEL 1;  Surgeon: Jacki Cones, MD;  Location: [facility];  Service: Orthopedics;  Laterality: N/A;  Hemi Laminectomy Microdiscectomy L4 - L5 Central (X-Ray)   ingrown toenail removal     TONSILLECTOMY     TOTAL HIP ARTHROPLASTY Left 11/05/2013   Procedure: LEFT TOTAL HIP ARTHROPLASTY;  Surgeon: Jacki Cones, MD;  Location: [facility];  Service: Orthopedics;  Laterality: Left;   Social History:  reports that she has been smoking cigarettes. She has a 3 pack-year smoking history. She has never used smokeless tobacco. She reports that she does not drink alcohol and does not use drugs.  No Known Allergies  Family History  Problem Relation Age of Onset   Heart attack Mother 69   Hypertension Sister    Diabetes type II Sister    Tongue cancer Brother    Heart attack Sister 77   Lung cancer Sister    Hypertension Sister     Prior to Admission medications   Medication Sig Start Date End Date Taking? Authorizing Provider  albuterol (VENTOLIN HFA) 108 (90 Base) MCG/ACT inhaler Inhale 2 puffs into the lungs every 4 (four) hours as needed for wheezing or shortness of breath. 07/24/23  Yes Dewald, Bettina Gavia, MD  atorvastatin (LIPITOR) 10 MG tablet Take 10 mg by mouth daily. 03/20/23  Yes [provider]  enalapril-hydrochlorothiazide (VASERETIC) 10-25 MG tablet Take 1 tablet by mouth daily. 08/12/23  Yes [provider]  ipratropium-albuterol (DUONEB) 0.5-2.5 (3) MG/3ML SOLN Take 3 mLs by nebulization 3 (three) times daily. Patient taking differently: Take 3 mLs by nebulization every 6 (six) hours as needed. 07/24/23  Yes Martina Sinner, MD  mirtazapine (REMERON) 7.5 MG tablet Take 7.5 mg by mouth at bedtime. 08/10/23  Yes [provider]  Multiple Vitamin (MULTIVITAMIN) capsule Take 1 capsule by mouth daily.   Yes [provider]   oxyCODONE-acetaminophen (PERCOCET) 10-325 MG tablet Take 1 tablet by mouth every 4 (four) hours as needed for pain. 12/23/20  Yes [provider]  Potassium Chloride ER 20 MEQ TBCR Take 1 tablet by mouth daily. 06/12/23  Yes [provider]  zolpidem (AMBIEN) 5 MG tablet Take 5 mg by mouth at bedtime. 12/25/20  Yes [provider]  benzonatate (TESSALON) 100 MG capsule Take 1 capsule (100 mg total) by mouth 3 (three) times daily. Patient not taking: Reported on 09/05/2023 05/27/23   Rolly Salter, MD  budesonide (PULMICORT) 0.5 MG/2ML nebulizer solution Take 2 mLs (0.5 mg total) by nebulization 2 (two) times daily. Patient not taking: Reported on 09/05/2023 07/24/23   Martina Sinner, MD  busPIRone (BUSPAR) 10 MG tablet Take 1 tablet (10 mg total) by mouth 2 (two) times daily. Patient not taking: Reported on 09/05/2023 05/27/23   Rolly Salter, MD  ibuprofen (ADVIL) 800 MG tablet Take 800 mg by mouth every 6 (six) hours as needed for mild pain (pain score 1-3) or moderate pain (pain score 4-6). 05/06/23   [provider]  polyethylene glycol (MIRALAX / GLYCOLAX) 17 g packet Take 17 g by mouth daily as needed for mild constipation. Patient not taking: Reported on 09/05/2023 05/27/23   Rolly Salter, MD    Physical Exam: Vitals:   09/04/23 2215 09/04/23 2230 09/04/23 2330 09/05/23 0000  BP: 110/74 104/69 118/62 94/67  Pulse:      Resp: (!) 26 18 20 17   Temp:      TempSrc:      SpO2: 98% 98%  97%  Weight:      Height:        Physical Exam  Gen:- Awake Alert, in no acute distress , some conversational dyspnea HEENT:- [place].AT, No sclera icterus Neck-Supple Neck,No JVD,.  Lungs-diminished breath sounds, no significant wheezing (had bronchodilator treatments prior to exam) CV- S1, S2 normal, RRR Abd-  +ve B.Sounds, Abd Soft, No tenderness,    Extremity/Skin:- No  edema,   good pedal pulses  Psych-affect is appropriate, oriented x3 Neuro-no new focal  deficits, no tremors  Data Reviewed:  Chest x-ray without acute findings, BNP is only 24.3, EKG sinus rhythm -Troponin is 5, VBG with a pH of 7.33 pCO2 of 70 bicarb of 36.9--review of record reveals that patient pCO2 is usually around 70 for the most part -Potassium is 3.4 sodium is 141 creatinine 0.63 -CBC is unremarkable, lactic acid 0.7  Assessment and Plan: 1)Acute COPD Exacerbation- no definite pneumonia,  treat empirically with IV Solu-Medrol,  give mucolytics, doxycycline and bronchodilators as ordered  2) acute on chronic hypoxic and hypercapnic respiratory failure--due to #1 above EDP reports very poor air movement, wheezes and increased work of breathing initially--- patient received back-to-back nebulizer treatments, steroids and was placed on BiPAP -At the time of my evaluation patient was off BiPAP, had some conversational dyspnea, she was not hypoxic -- supplemental oxygen as needed  --  May use as needed BiPAP for increased work of breathing  3) tobacco abuse--smoking cessation advised -Nicotine patch as ordered  4)GERD--- continue Protonix especially while on steroids  5) atypical chest pain--most likely due to 1 above -Patient has ruled out for ACS by cardiac enzymes and EKG -Chest x-ray without acute findings, BNP is only 24.3, EKG sinus rhythm -Troponin is 5   Advance Care Planning:   Code Status: Full Code   Family Communication: None available  Severity of Illness: The appropriate patient status for this patient is INPATIENT. Inpatient status is judged to be reasonable and necessary in order to provide the required intensity of service to ensure the patient's safety. The patient's presenting symptoms, physical exam findings, and initial radiographic and laboratory data in the context of their chronic comorbidities is felt to place them at high risk for further clinical deterioration. Furthermore, it is not anticipated that the patient will be medically stable for  discharge from the hospital within 2 midnights of admission.   * I certify that at the point of admission it is my clinical judgment that the patient will require inpatient hospital care spanning beyond 2 midnights from the point of admission due to high intensity of service, high risk for further deterioration and high frequency of surveillance required.*  Author: Shon Hale, MD 09/05/2023 1:18 AM  For on call review [email or web address].

## 2023-09-05 NOTE — Progress Notes (Signed)
 [facility] Progress Note  Patient: [name]     ZOX:[id number]  DOA: 09/04/2023   PCP: Karenann Cai, NP       Brief hospital course: This is a 68 year old female with COPD, chronic respiratory failure on 4 L of oxygen, ongoing cigarette use, hypertension and GERD who presents to the hospital for severe dyspnea associated with chest tightness and hypoxia.  Noted to be wheezing on exam and required BiPAP.  Admitted for a COPD exacerbation.  Subjective:  Complains of severe chest pain which is worse when she takes a deep breath.  Present in the substernal area.  She is not short of breath on my evaluation but does admit to a dry cough.  Assessment and Plan: Principal Problem:   COPD with acute exacerbation -ongoing tobacco abuse -Continue supportive care - Continue doxycycline, Solu-Medrol, nebulizer treatments - Encouraged tobacco cessation - Active Problems:   Chest pain - Appears to be secondary to cough and musculoskeletal issues rather than cardiac pain - As needed pain medications have been ordered  Essential hypertension -BP not elevated and lisinopril HCTZ currently on hold  Hypokalemia - Replace    Code Status: Full Code Total time on patient care: 35 minutes DVT prophylaxis: Heparin  Objective:   Vitals:   09/05/23 1230 09/05/23 1300 09/05/23 1441 09/05/23 1500  BP: (!) 125/95 (!) 125/95 116/78   Pulse:  77 78   Resp:  16 18   Temp:  97.9 F (36.6 C) 98.6 F (37 C)   TempSrc:   Oral   SpO2:  98% 99%   Weight:   41.7 kg 41.7 kg  Height:   5\' 1"  (1.549 m)    Filed Weights   09/04/23 2151 09/05/23 1441 09/05/23 1500  Weight: 43 kg 41.7 kg 41.7 kg   Exam: General exam: Appears comfortable  HEENT: oral mucosa moist Respiratory system: Clear to auscultation.  Cardiovascular system: S1 & S2 heard  Gastrointestinal system: Abdomen soft, non-tender, nondistended. Normal bowel sounds   Extremities: No cyanosis, clubbing or  edema Psychiatry:  Mood & affect appropriate.      CBC: Recent Labs  Lab 09/04/23 2151  WBC 7.0  NEUTROABS 2.8  HGB 12.6  HCT 41.4  MCV 97.4  PLT 308   Basic Metabolic Panel: Recent Labs  Lab 09/04/23 2151  NA 141  K 3.4*  CL 102  CO2 29  GLUCOSE 101*  BUN 14  CREATININE 0.63  CALCIUM 9.2     Scheduled Meds:  atorvastatin  10 mg Oral Daily   dextromethorphan-guaiFENesin  1 tablet Oral BID   doxycycline  100 mg Oral Q12H   heparin  5,000 Units Subcutaneous Q8H   ipratropium-albuterol  3 mL Nebulization Q6H   methylPREDNISolone (SOLU-MEDROL) injection  40 mg Intravenous Q12H   mirtazapine  7.5 mg Oral QHS   pantoprazole  40 mg Oral Daily   sodium chloride flush  3 mL Intravenous Q12H   sodium chloride flush  3 mL Intravenous Q12H    Imaging and lab data personally reviewed   Author: Calvert Cantor  09/05/2023 3:26 PM  To contact [facility]>   Check the care team in [facility] and look for the attending/consulting TR[facility] provider listed  Log into [email or web address] and use [place]'s universal password   Go to> "[facility]"  and find provider  If you still have difficulty reaching the provider, please page the [facility] (Director on Call) for the Hospitalists listed on amion

## 2023-09-05 NOTE — Care Management (Addendum)
 Transition of Care [facility]) - Emergency Department Mini Assessment   Patient Details  Name: [name] MRN: [id number] Date of Birth: 03/10/1956  Transition of Care [facility]) CM/SW Contact:    Lavenia Atlas, RN Phone Number: 09/05/2023, 12:56 PM   Clinical Narrative: Received call from Mitch with [employer] who reports patient has been approved for home NIV and has current home oxygen with [employer]. [employer] met with patient on yesterday at her home re: NIV and patient request to change home oxygen services to [employer]. Per chart review patient is currently in [facility] ED for respiratory distress, plan for inpatient admission. PMH: COPD  This RNCM spoke with patient at bedside who reports she is having difficulty affording her home oxygen and wants to change to [employer] as they have a program to assist her. Patient reports she has returned 4 oxygen tanks to [employer]. Patient reports she has the following DME: walker, shower chair, cane, BSC prior to discharge. Patient inquired about obtaining HHPT/OT services, as she has difficulty breathing while ambualting. This RNCM advised PT will need to evaluate her and closer to dc [facility] team can assist if [facility] services are needed. Patient also inquiring about obtaining pulse ox machine and advised [employer] will assist her with POC.   This RNCM notified Jermaine with [employer] of patient's request to end home oxygen services.    TOC will continue to follow.   ED Mini Assessment: What brought you to the Emergency Department? : Patient reports hard time breathing (respiratory distress)  Barriers to Discharge: Continued Medical Work up  [employer] interventions: Assistance with changing home oxygen supplier     Interventions which prevented an admission or readmission: Other (must enter comment) (wants to change home oxygen supplier)    Patient Contact and Communications        ,          Patient states their goals for this hospitalization and ongoing recovery are:: To  feel better and be able to afford her home oxygen CMS Medicare.gov Compare Post Acute Care list provided to:: Patient Choice offered to / list presented to : Patient  Admission diagnosis:  COPD with acute exacerbation [facility]) [J44.1] Patient Active Problem List   Diagnosis Date Noted   Wheezing 05/24/2023   COPD exacerbation (HCC) 05/22/2023   Precordial chest pain 03/31/2023   Centrilobular emphysema (HCC) 12/13/2022   Palliative care by specialist 10/10/2022   Goals of care, counseling/discussion 10/10/2022   Vocal cord dysfunction 10/08/2022   Anxiety 10/08/2022   Protein-calorie malnutrition, severe 10/04/2022   COPD with acute exacerbation (HCC) 10/02/2022   Grief 06/03/2022   Pain of left upper extremity 12/28/2016   Elevated TSH 11/08/2016   Gastroesophageal reflux disease 11/08/2016   Constipation 11/08/2016   COPD (chronic obstructive pulmonary disease) (HCC) 11/08/2016   Tobacco use 11/08/2016   Atypical chest pain 11/07/2016   Unstable angina (HCC) 01/07/2016   HTN (hypertension) 01/07/2016   Acute blood loss anemia 11/06/2013   Osteoarthritis of left hip 11/05/2013   History of total left hip replacement 11/05/2013   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

## 2023-09-06 ENCOUNTER — Inpatient Hospital Stay (HOSPITAL_COMMUNITY)

## 2023-09-06 ENCOUNTER — Other Ambulatory Visit (HOSPITAL_COMMUNITY): Payer: Self-pay

## 2023-09-06 DIAGNOSIS — J441 Chronic obstructive pulmonary disease with (acute) exacerbation: Secondary | ICD-10-CM | POA: Diagnosis not present

## 2023-09-06 DIAGNOSIS — J9621 Acute and chronic respiratory failure with hypoxia: Secondary | ICD-10-CM

## 2023-09-06 DIAGNOSIS — J9611 Chronic respiratory failure with hypoxia: Secondary | ICD-10-CM

## 2023-09-06 MED ORDER — ENSURE ENLIVE PO LIQD
237.0000 mL | Freq: Three times a day (TID) | ORAL | 30 refills | Status: DC
  Filled 2023-09-06: qty 237, 1d supply, fill #0

## 2023-09-06 MED ORDER — NICOTINE 7 MG/24HR TD PT24
7.0000 mg | MEDICATED_PATCH | Freq: Every day | TRANSDERMAL | 0 refills | Status: DC
  Filled 2023-09-06: qty 14, 14d supply, fill #0

## 2023-09-06 MED ORDER — NICOTINE 7 MG/24HR TD PT24
7.0000 mg | MEDICATED_PATCH | Freq: Every day | TRANSDERMAL | Status: DC
  Administered 2023-09-06: 7 mg via TRANSDERMAL
  Filled 2023-09-06: qty 1

## 2023-09-06 MED ORDER — PREDNISONE 20 MG PO TABS
40.0000 mg | ORAL_TABLET | Freq: Every day | ORAL | 0 refills | Status: DC
  Filled 2023-09-06: qty 8, 4d supply, fill #0

## 2023-09-06 MED ORDER — ADULT MULTIVITAMIN W/MINERALS CH
1.0000 | ORAL_TABLET | Freq: Every day | ORAL | Status: DC
Start: 2023-09-06 — End: 2023-09-06
  Administered 2023-09-06: 1 via ORAL
  Filled 2023-09-06: qty 1

## 2023-09-06 MED ORDER — ENSURE ENLIVE PO LIQD
237.0000 mL | Freq: Three times a day (TID) | ORAL | Status: DC
  Administered 2023-09-06: 237 mL via ORAL

## 2023-09-06 MED ORDER — DM-GUAIFENESIN ER 30-600 MG PO TB12: 1.0000 | ORAL_TABLET | Freq: Two times a day (BID) | ORAL | 1 refills | Status: DC

## 2023-09-06 MED ORDER — DOXYCYCLINE HYCLATE 100 MG PO TABS: 100.0000 mg | ORAL_TABLET | Freq: Two times a day (BID) | ORAL | 0 refills | Status: AC

## 2023-09-06 MED ORDER — FLUTICASONE-SALMETEROL 250-50 MCG/ACT IN AEPB
1.0000 | INHALATION_SPRAY | Freq: Two times a day (BID) | RESPIRATORY_TRACT | 0 refills | Status: DC
  Filled 2023-09-06: qty 60, 30d supply, fill #0

## 2023-09-06 MED ORDER — FLUTICASONE-SALMETEROL 500-50 MCG/ACT IN AEPB
1.0000 | INHALATION_SPRAY | Freq: Two times a day (BID) | RESPIRATORY_TRACT | 12 refills | Status: DC
  Filled 2023-09-06: qty 60, 30d supply, fill #0

## 2023-09-06 NOTE — Progress Notes (Signed)
 Mobility Specialist - Progress Note  (RA) Pre-mobility: 85 bpm HR, 93% SpO2 During mobility: 112 bpm HR, 89% SpO2 Post-mobility: 79 bpm HR, 91% SPO2   09/06/23 1341  Mobility  Activity Ambulated independently in hallway  Level of Assistance Independent  Assistive Device None  Distance Ambulated (ft) 350 ft  Range of Motion/Exercises Active  Activity Response Tolerated well  Mobility Referral Yes  Mobility visit 1 Mobility  Mobility Specialist Start Time (ACUTE ONLY) 1330  Mobility Specialist Stop Time (ACUTE ONLY) 1341  Mobility Specialist Time Calculation (min) (ACUTE ONLY) 11 min   Pt was found sitting EOB and agreeable to ambulate. SPO2 maintained 89%-91% throughout session. Returned to sit EOB with all needs met. Call bell in reach.  Billey Chang Mobility Specialist

## 2023-09-06 NOTE — Progress Notes (Signed)
 Discharge instructions given to patient questions ask and answered. Discharge medications delivered to bedside D [facility]

## 2023-09-06 NOTE — Plan of Care (Signed)

## 2023-09-06 NOTE — Progress Notes (Signed)
 Initial Nutrition Assessment  DOCUMENTATION CODES:   Severe malnutrition in context of chronic illness  INTERVENTION:  - Regular diet.  - Provided "High Calorie, High Protein Nutrition Therapy" handout. - Ensure Plus High Protein po TID, each supplement provides 350 kcal and 20 grams of protein.  - Provided coupons for patient to purchase aftter discharge. - Add Multivitamin with minerals daily - Monitor weight trend.  - Ordered outpatient RD referral.   NUTRITION DIAGNOSIS:   Severe Malnutrition related to chronic illness (COPD) as evidenced by severe fat depletion, severe muscle depletion.  GOAL:   Patient will meet greater than or equal to 90% of their needs  MONITOR:   PO intake, Supplement acceptance, Weight trends  REASON FOR ASSESSMENT:   Consult Assessment of nutrition requirement/status  ASSESSMENT:   68 y.o. female with COPD, chronic respiratory failure on 4 L of oxygen, ongoing cigarette use, HTN and GERD who presented for severe dyspnea with chest tightness and hypoxia.  Admitted for a COPD exacerbation.  Patient reports a UBW of 146# she last weighed around 2022 and steady weight loss since that time, mostly between 2022-2023.   Per chart review, patient has not weighed as much as 146# since 2018. Her weight is noted to be steady from August 2022-August 2024. It appears patient lost 7# (7%) weight loss from August 2024-December 2024, but weight stable since that time.  Patient endorses eating around 2 small meals a day at home. Doesn't usually eat breakfast but when she does will have a whole spread of eggs, bacon, pancakes. More often just eats lunch and dinner although per her recall having small meals of things such as just chips or a hot dog. Notes her appetite has been somewhat decreased recently and she has also had all her teeth pulled over the past several months, which has hindered her intake. Notes her gums are sore but often just tears her food into  smaller pieces to be able to eat.  Patient was doing Boost Plus once every other day at home but recently got some Ensure to try.   Her current appetite is a little better and patient reports eating well at breakfast this AM.   Provided patient with "High Calorie, High Protein Nutrition Therapy" handout from the [employer]. Discussed tips for increasing calorie and protein such as eating every couple hours and adding gravy, butter, and sauces to meals. Provided list of calorie and protein dense snacks and meals. Also discussed would recommend consuming 3 Ensures daily at home with or in between meals to supplement intake. Provided patient with Ensure coupons to take home.   Patient receptive to information and has goals for weight gain. Provided patient with Ensure during end of visit. She is likely to discharge today after some xrays. Patient is interested in an outpatient dietitian referral, will order.   Medications reviewed and include: Remeron  Labs reviewed:  K+ 3.4   NUTRITION - FOCUSED PHYSICAL EXAM:  Flowsheet Row Most Recent Value  Orbital Region Moderate depletion  Upper Arm Region Severe depletion  Thoracic and Lumbar Region Severe depletion  Buccal Region Severe depletion  Temple Region Severe depletion  Clavicle Bone Region Severe depletion  Clavicle and Acromion Bone Region Severe depletion  Scapular Bone Region Unable to assess  Dorsal Hand Mild depletion  Patellar Region Severe depletion  Anterior Thigh Region Severe depletion  Posterior Calf Region Moderate depletion  Edema (RD Assessment) None  Hair Reviewed  Eyes Reviewed  Mouth  Reviewed  [edentulous]  Skin Reviewed  Nails Reviewed       Diet Order:   Diet Order             Diet regular Fluid consistency: Thin  Diet effective now                   EDUCATION NEEDS:  Education needs have been addressed  Skin:  Skin Assessment: Reviewed RN Assessment  Last BM:   4/9  Height:  Ht Readings from Last 1 Encounters:  09/05/23 5\' 1"  (1.549 m)   Weight:  Wt Readings from Last 1 Encounters:  09/05/23 41.7 kg    BMI:  Body mass index is 17.38 kg/m.  Estimated Nutritional Needs:  Kcal:  1550-1650 kcals Protein:  70-85 grams Fluid:  >/= 1.6L    Shelle Iron RD, LDN Contact via Secure Chat.

## 2023-09-06 NOTE — Discharge Summary (Addendum)
 Physician Discharge Summary  [name] ZOX:[id number] DOB: 14-Feb-1956 DOA: 09/04/2023  PCP: Karenann Cai, NP  Admit date: 09/04/2023 Discharge date: 09/06/2023 Discharging to: home Recommendations for Outpatient Follow-up:  Continue to encourage smoking cessation     Discharge Diagnoses:   Principal Problem:   COPD with acute exacerbation (HCC) Active Problems:   Chronic respiratory failure with hypoxia and hypercapnia (HCC)   Musculoskeletal chest pain   Gastroesophageal reflux disease   Tobacco use   Protein-calorie malnutrition, severe     Brief hospital course: This is a 68 year old female with COPD, chronic respiratory failure on 4 L of oxygen, ongoing cigarette use, hypertension and GERD who presents to the hospital for severe dyspnea associated with chest tightness and hypoxia.  Noted to be wheezing on exam and required BiPAP.  Admitted for a COPD exacerbation.   Subjective:  Chest pain has improved.  Still gets short of breath when moving around but overall improved.  She is still quite concerned about ongoing weight loss   Assessment and Plan: Principal Problem:   COPD with acute exacerbation -ongoing tobacco abuse-chronic hypoxemic and hypercarbic respiratory failure -Continue supportive care - Treated with doxycycline, Solu-Medrol, nebulizer treatments - Encouraged tobacco cessation - Will DC home with prednisone, doxycycline and prescriptions for inhalers-due to insurance reasons, she has not had any affordable inhalers - She will need home O2 as pulse ox is dropping to 87% with exertion - She is in the process of obtaining a BiPAP for home use  Active Problems:    Chest pain - Appears to be secondary to cough and musculoskeletal issues rather than cardiac pain - As needed pain medications have been ordered   Essential hypertension -BP not elevated and lisinopril HCTZ currently on hold   Hypokalemia - Replaced   Severe malnutrition-underweight -  Nutrition eval was requested and the patient has been started on dietary supplements - The patient was very concerned about her 40 pound weight loss-due to her smoking history, I did do a CT of her chest abdomen pelvis and a bone survey which does not reveal any masses    Discharge Instructions  Discharge Instructions     Amb Referral to Nutrition and Diabetic Education   Complete by: As directed    Diet - low sodium heart healthy   Complete by: As directed    Increase activity slowly   Complete by: As directed       Allergies as of 09/06/2023   No Known Allergies      Medication List     STOP taking these medications    budesonide 0.5 MG/2ML nebulizer solution Commonly known as: Pulmicort   enalapril-hydrochlorothiazide 10-25 MG tablet Commonly known as: VASERETIC       TAKE these medications    albuterol 108 (90 Base) MCG/ACT inhaler Commonly known as: VENTOLIN HFA Inhale 2 puffs into the lungs every 4 (four) hours as needed for wheezing or shortness of breath.   atorvastatin 10 MG tablet Commonly known as: LIPITOR Take 10 mg by mouth daily.   dextromethorphan-guaiFENesin 30-600 MG 12hr tablet Commonly known as: MUCINEX DM Take 1 tablet by mouth 2 (two) times daily.   doxycycline 100 MG tablet Commonly known as: VIBRA-TABS Take 1 tablet (100 mg total) by mouth every 12 (twelve) hours for 5 days.   feeding supplement (OSMOLITE 1.2 CAL) Liqd Take 237 mLs by mouth 3 (three) times daily between meals.   fluticasone-salmeterol 250-50 MCG/ACT Aepb Commonly known as: Advair Diskus Inhale 1  puff into the lungs in the morning and at bedtime.   ibuprofen 800 MG tablet Commonly known as: ADVIL Take 800 mg by mouth every 6 (six) hours as needed for mild pain (pain score 1-3) or moderate pain (pain score 4-6).   ipratropium-albuterol 0.5-2.5 (3) MG/3ML Soln Commonly known as: DUONEB Take 3 mLs by nebulization 3 (three) times daily. What changed:  when to take  this reasons to take this   multivitamin capsule Take 1 capsule by mouth daily.   nicotine 7 mg/24hr patch Commonly known as: Nicoderm CQ Place 1 patch (7 mg total) onto the skin daily.   oxyCODONE-acetaminophen 10-325 MG tablet Commonly known as: PERCOCET Take 1 tablet by mouth every 4 (four) hours as needed for pain.   polyethylene glycol 17 g packet Commonly known as: MIRALAX / GLYCOLAX Take 17 g by mouth daily as needed for mild constipation.   Potassium Chloride ER 20 MEQ Tbcr Take 1 tablet by mouth daily.   predniSONE 20 MG tablet Commonly known as: DELTASONE Take 2 tablets (40 mg total) by mouth daily.   zolpidem 5 MG tablet Commonly known as: AMBIEN Take 5 mg by mouth at bedtime.               Durable Medical Equipment  (From admission, onward)           Start     Ordered   09/06/23 1437  For home use only DME oxygen  Once       Comments: POC eval, 1-5L Pulse Dose. If qualifies, dispense  Question Answer Comment  Length of Need Lifetime   Mode or (Route) Nasal cannula   Liters per Minute 2   Frequency Continuous (stationary and portable oxygen unit needed)   Oxygen conserving device Yes   Oxygen delivery system Gas      09/06/23 1436                The results of significant diagnostics from this hospitalization (including imaging, microbiology, ancillary and laboratory) are listed below for reference.    DG Bone Survey Met Result Date: 09/06/2023 CLINICAL DATA:  Abnormal weight loss EXAM: METASTATIC BONE SURVEY COMPARISON:  CT chest, abdomen, and pelvis dated 09/05/2023 FINDINGS: No suspicious lytic or sclerotic lesions in the axial or appendicular skeleton. Patient is edentulous.  Unerupted maxillary molar. Multilevel degenerative changes of the cervical spine characterized by mild intervertebral disc space narrowing and anterior disc osteophytes spanning C3-C7. Mild multilevel degenerative changes of the thoracic spine characterized by  anterior disc osteophyte formation. No substantial intervertebral disc space narrowing. Degenerative changes of the bilateral thumb carpometacarpal joint. Left hip arthroplasty. Hardware appears intact. Degenerative changes of the right hip. IMPRESSION: 1. No suspicious lytic or sclerotic lesions in the axial or appendicular skeleton. 2. Degenerative changes as described. Electronically Signed   By: Agustin Cree M.D.   On: 09/06/2023 13:30   CT CHEST ABDOMEN PELVIS W CONTRAST Result Date: 09/05/2023 CLINICAL DATA:  Aortic aneurysm suspected. EXAM: CT CHEST, ABDOMEN, AND PELVIS WITH CONTRAST TECHNIQUE: Multidetector CT imaging of the chest, abdomen and pelvis was performed following the standard protocol during bolus administration of intravenous contrast. RADIATION DOSE REDUCTION: This exam was performed according to the departmental dose-optimization program which includes automated exposure control, adjustment of the mA and/or kV according to patient size and/or use of iterative reconstruction technique. CONTRAST:  OMNIPAQUE IOHEXOL 300 MG/ML  SOLN COMPARISON:  CT Angiography chest from 12/01/2021 and CT scan abdomen and pelvis from  01/17/2021. FINDINGS: CT CHEST FINDINGS Cardiovascular: Normal cardiac size. No pericardial effusion. No aortic aneurysm. There are mild peripheral atherosclerotic vascular calcifications of thoracic aorta and its major branches. There is satisfactory opacification of pulmonary arteries. No embolism seen up to the proximal subsegmental pulmonary artery level. There is dilation of the main pulmonary trunk measuring up to 3.1 cm, which is nonspecific but can be seen with pulmonary artery hypertension. Mediastinum/Nodes: Visualized thyroid gland appears grossly unremarkable. No solid / cystic mediastinal masses. The esophagus is nondistended precluding optimal assessment. No axillary, mediastinal or hilar lymphadenopathy by size criteria. Lungs/Pleura: The central tracheo-bronchial  tree is patent. There moderate to severe upper lobe predominant emphysematous changes. No mass or consolidation. No pleural effusion or pneumothorax. No suspicious lung nodules. Musculoskeletal: The visualized soft tissues of the chest wall are grossly unremarkable. No suspicious osseous lesions. There are mild to moderate multilevel degenerative changes in the visualized spine. CT ABDOMEN PELVIS FINDINGS Hepatobiliary: The liver is normal in size. Non-cirrhotic configuration. No suspicious mass. These is mild diffuse hepatic steatosis. No intrahepatic or extrahepatic bile duct dilation. No calcified gallstones. Normal gallbladder wall thickness. No pericholecystic inflammatory changes. Pancreas: Unremarkable. No pancreatic ductal dilatation or surrounding inflammatory changes. Spleen: Within normal limits. No focal lesion. Adrenals/Urinary Tract: Adrenal glands are unremarkable. No suspicious renal mass. There is a 8 x 8 mm simple cyst in the right kidney upper pole, posteriorly. No hydronephrosis. No renal or ureteric calculi. Evaluation of urinary bladder is nondiagnostic due to extensive streak artifacts from left hip arthroplasty hardware. Stomach/Bowel: No disproportionate dilation of the small or large bowel loops. No evidence of abnormal bowel wall thickening or inflammatory changes. The appendix was not visualized; however there is no acute inflammatory process in the right lower quadrant. There are multiple diverticula throughout the colon, without imaging signs of diverticulitis. Vascular/Lymphatic: No ascites or pneumoperitoneum. No abdominal or pelvic lymphadenopathy, by size criteria. No aneurysmal dilation of the major abdominal arteries. There are mild peripheral atherosclerotic vascular calcifications of the aorta and its major branches. Reproductive: Not well evaluated due to extensive streak artifacts from left hip arthroplasty hardware. However, uterus is likely surgically absent. Other: The  visualized soft tissues and abdominal wall are unremarkable. Musculoskeletal: No suspicious osseous lesions. There are mild - moderate multilevel degenerative changes in the visualized spine. Left hip arthroplasty noted. IMPRESSION: 1. No acute inflammatory process identified within the chest, abdomen or pelvis. No aortic aneurysm. 2. Multiple other nonacute observations, as described above. Aortic Atherosclerosis (ICD10-I70.0) and Emphysema (ICD10-J43.9). Electronically Signed   By: Jules Schick M.D.   On: 09/05/2023 11:04   DG Chest Portable 1 View Result Date: 09/05/2023 CLINICAL DATA:  sob EXAM: PORTABLE CHEST 1 VIEW COMPARISON:  Chest x-ray 06/26/2023, CT cardiac 04/09/2023 FINDINGS: The heart and mediastinal contours are unchanged. Atherosclerotic plaque. No focal consolidation. No pulmonary edema. No pleural effusion. No pneumothorax. No acute osseous abnormality. IMPRESSION: No active disease. Electronically Signed   By: Tish Frederickson M.D.   On: 09/05/2023 00:49   Labs:   Basic Metabolic Panel: Recent Labs  Lab 09/04/23 2151  NA 141  K 3.4*  CL 102  CO2 29  GLUCOSE 101*  BUN 14  CREATININE 0.63  CALCIUM 9.2     CBC: Recent Labs  Lab 09/04/23 2151  WBC 7.0  NEUTROABS 2.8  HGB 12.6  HCT 41.4  MCV 97.4  PLT 308         SIGNED:   Calvert Cantor, MD  [facility] 09/06/2023,  3:49 PM Time taking on discharge: 50 minutes

## 2023-09-06 NOTE — Progress Notes (Addendum)
 SATURATION QUALIFICATIONS: (This note is used to comply with regulatory documentation for home oxygen)  Patient Saturations on Room Air at Rest = 93%  Patient Saturations on Room Air while Ambulating =87%  Patient Saturations on 2 Liters of oxygen while Ambulating = 94%  Please briefly explain why patient needs home oxygen:Pt needs 2L oxygen to maintain saturation above 92%.

## 2023-09-06 NOTE — TOC Transition Note (Signed)
 Transition of Care [facility]) - Discharge Note   Patient Details  Name: [name] MRN: [id number] Date of Birth: 11-20-1955  [facility] CM/SW Contact:  Larrie Kass, LCSW Phone Number: 09/06/2023, 1:27 PM   Clinical Narrative:    Pt has requested to change oxygen company. CSW spoke with Mitch from [facility], who stated that the pt will need a new walking saturation note and updated orders. MD has been made aware. Pt is to d/c home and is requesting a ride. CSW provided the pt's RN with a taxi voucher. Pt's portable oxygen tank is expected to be delivered to her room prior to d/c. TOC will sign off."   Final next level of care: Home/Self Care Barriers to Discharge: Barriers Resolved   Patient Goals and CMS Choice Patient states their goals for this hospitalization and ongoing recovery are:: return home CMS Medicare.gov Compare Post Acute Care list provided to:: Patient Choice offered to / list presented to : Patient      Discharge Placement                    Patient and family notified of of transfer: 09/06/23  Discharge Plan and Services Additional resources added to the After Visit Summary for                  DME Arranged: Oxygen DME Agency: [employer]                  Social Drivers of Health (SDOH) Interventions SDOH Screenings   Food Insecurity: No Food Insecurity (09/05/2023)  Housing: Low Risk  (09/05/2023)  Transportation Needs: No Transportation Needs (09/05/2023)  Utilities: Not At Risk (09/05/2023)  Depression (PHQ2-9): Low Risk  (10/05/2020)  Financial Resource Strain: Low Risk  (10/05/2020)  Social Connections: Moderately Integrated (09/05/2023)  Stress: No Stress Concern Present (10/05/2020)  Tobacco Use: High Risk (09/05/2023)     Readmission Risk Interventions    10/04/2022    9:50 AM  Readmission Risk Prevention Plan  Post Dischage Appt Complete  Medication Screening Complete  Transportation Screening  Complete

## 2023-09-20 NOTE — Telephone Encounter (Signed)
 Ct results has been discarded since patient hasn't picked it from our office.

## 2023-10-11 ENCOUNTER — Emergency Department (HOSPITAL_COMMUNITY)

## 2023-10-11 ENCOUNTER — Encounter (HOSPITAL_COMMUNITY): Payer: Self-pay

## 2023-10-11 ENCOUNTER — Other Ambulatory Visit: Payer: Self-pay

## 2023-10-11 ENCOUNTER — Observation Stay (HOSPITAL_COMMUNITY)

## 2023-10-11 ENCOUNTER — Observation Stay (HOSPITAL_COMMUNITY)
Admission: EM | Admit: 2023-10-11 | Discharge: 2023-10-16 | Disposition: A | Discharge status: Home / Self Care | Attending: Family Medicine | Admitting: Family Medicine

## 2023-10-11 DIAGNOSIS — Z79899 Other long term (current) drug therapy: Secondary | ICD-10-CM | POA: Insufficient documentation

## 2023-10-11 DIAGNOSIS — I10 Essential (primary) hypertension: Secondary | ICD-10-CM | POA: Diagnosis not present

## 2023-10-11 DIAGNOSIS — J432 Centrilobular emphysema: Secondary | ICD-10-CM | POA: Diagnosis not present

## 2023-10-11 DIAGNOSIS — G8929 Other chronic pain: Secondary | ICD-10-CM | POA: Diagnosis not present

## 2023-10-11 DIAGNOSIS — J9621 Acute and chronic respiratory failure with hypoxia: Principal | ICD-10-CM | POA: Diagnosis present

## 2023-10-11 DIAGNOSIS — F17210 Nicotine dependence, cigarettes, uncomplicated: Secondary | ICD-10-CM | POA: Insufficient documentation

## 2023-10-11 DIAGNOSIS — J9611 Chronic respiratory failure with hypoxia: Secondary | ICD-10-CM | POA: Diagnosis present

## 2023-10-11 DIAGNOSIS — J9622 Acute and chronic respiratory failure with hypercapnia: Secondary | ICD-10-CM | POA: Diagnosis not present

## 2023-10-11 DIAGNOSIS — R072 Precordial pain: Secondary | ICD-10-CM | POA: Diagnosis not present

## 2023-10-11 DIAGNOSIS — Z1152 Encounter for screening for COVID-19: Secondary | ICD-10-CM | POA: Diagnosis not present

## 2023-10-11 DIAGNOSIS — R079 Chest pain, unspecified: Secondary | ICD-10-CM | POA: Diagnosis not present

## 2023-10-11 DIAGNOSIS — E876 Hypokalemia: Secondary | ICD-10-CM | POA: Diagnosis not present

## 2023-10-11 DIAGNOSIS — Z720 Tobacco use: Secondary | ICD-10-CM | POA: Diagnosis present

## 2023-10-11 DIAGNOSIS — J9601 Acute respiratory failure with hypoxia: Secondary | ICD-10-CM | POA: Diagnosis not present

## 2023-10-11 DIAGNOSIS — R0789 Other chest pain: Secondary | ICD-10-CM | POA: Diagnosis present

## 2023-10-11 DIAGNOSIS — J441 Chronic obstructive pulmonary disease with (acute) exacerbation: Principal | ICD-10-CM | POA: Diagnosis present

## 2023-10-11 DIAGNOSIS — R0602 Shortness of breath: Secondary | ICD-10-CM | POA: Diagnosis present

## 2023-10-11 DIAGNOSIS — R9431 Abnormal electrocardiogram [ECG] [EKG]: Secondary | ICD-10-CM | POA: Diagnosis not present

## 2023-10-11 DIAGNOSIS — Z96642 Presence of left artificial hip joint: Secondary | ICD-10-CM | POA: Insufficient documentation

## 2023-10-11 DIAGNOSIS — K625 Hemorrhage of anus and rectum: Secondary | ICD-10-CM | POA: Insufficient documentation

## 2023-10-11 LAB — CBC WITH DIFFERENTIAL/PLATELET
Abs Immature Granulocytes: 0.01 10*3/uL (ref 0.00–0.07)
Basophils Absolute: 0 10*3/uL (ref 0.0–0.1)
Basophils Relative: 1 %
Eosinophils Absolute: 0.2 10*3/uL (ref 0.0–0.5)
Eosinophils Relative: 3 %
HCT: 38.3 % (ref 36.0–46.0)
Hemoglobin: 11.9 g/dL — ABNORMAL LOW (ref 12.0–15.0)
Immature Granulocytes: 0 %
Lymphocytes Relative: 56 %
Lymphs Abs: 3.5 10*3/uL (ref 0.7–4.0)
MCH: 30 pg (ref 26.0–34.0)
MCHC: 31.1 g/dL (ref 30.0–36.0)
MCV: 96.5 fL (ref 80.0–100.0)
Monocytes Absolute: 0.5 10*3/uL (ref 0.1–1.0)
Monocytes Relative: 8 %
Neutro Abs: 2 10*3/uL (ref 1.7–7.7)
Neutrophils Relative %: 32 %
Platelets: 249 10*3/uL (ref 150–400)
RBC: 3.97 MIL/uL (ref 3.87–5.11)
RDW: 13.8 % (ref 11.5–15.5)
WBC: 6.2 10*3/uL (ref 4.0–10.5)
nRBC: 0 % (ref 0.0–0.2)

## 2023-10-11 LAB — ECHOCARDIOGRAM COMPLETE
Height: 61 in
S' Lateral: 1.9 cm
Single Plane A4C EF: 70.2 %
Weight: 1584 [oz_av]

## 2023-10-11 LAB — COMPREHENSIVE METABOLIC PANEL WITH GFR
ALT: 9 U/L (ref 0–44)
AST: 17 U/L (ref 15–41)
Albumin: 4 g/dL (ref 3.5–5.0)
Alkaline Phosphatase: 41 U/L (ref 38–126)
Anion gap: 8 (ref 5–15)
BUN: 16 mg/dL (ref 8–23)
CO2: 31 mmol/L (ref 22–32)
Calcium: 9 mg/dL (ref 8.9–10.3)
Chloride: 97 mmol/L — ABNORMAL LOW (ref 98–111)
Creatinine, Ser: 0.61 mg/dL (ref 0.44–1.00)
GFR, Estimated: >60 mL/min (ref >60)
Glucose, Bld: 95 mg/dL (ref 70–99)
Potassium: 3 mmol/L — ABNORMAL LOW (ref 3.5–5.1)
Sodium: 136 mmol/L (ref 135–145)
Total Bilirubin: 0.6 mg/dL (ref 0.0–1.2)
Total Protein: 6.5 g/dL (ref 6.5–8.1)

## 2023-10-11 LAB — RESP PANEL BY RT-PCR (RSV, FLU A&B, COVID)  RVPGX2
Influenza A by PCR: NEGATIVE
Influenza B by PCR: NEGATIVE
Resp Syncytial Virus by PCR: NEGATIVE
SARS Coronavirus 2 by RT PCR: NEGATIVE

## 2023-10-11 LAB — BRAIN NATRIURETIC PEPTIDE: B Natriuretic Peptide: 22.7 pg/mL (ref 0.0–100.0)

## 2023-10-11 LAB — TROPONIN I (HIGH SENSITIVITY): Troponin I (High Sensitivity): 5 ng/L (ref <18)

## 2023-10-11 MED ORDER — ZOLPIDEM TARTRATE 5 MG PO TABS
5.0000 mg | ORAL_TABLET | Freq: Every evening | ORAL | Status: DC | PRN
  Administered 2023-10-11: 5 mg via ORAL
  Filled 2023-10-11 (×3): qty 1

## 2023-10-11 MED ORDER — ENSURE ENLIVE PO LIQD
237.0000 mL | Freq: Three times a day (TID) | ORAL | Status: DC
  Administered 2023-10-11 – 2023-10-16 (×11): 237 mL via ORAL

## 2023-10-11 MED ORDER — PREDNISONE 20 MG PO TABS
40.0000 mg | ORAL_TABLET | Freq: Every day | ORAL | Status: AC
  Administered 2023-10-11 – 2023-10-15 (×5): 40 mg via ORAL
  Filled 2023-10-11 (×5): qty 2

## 2023-10-11 MED ORDER — OXYCODONE-ACETAMINOPHEN 10-325 MG PO TABS: 1.0000 | ORAL_TABLET | Freq: Once | ORAL | Status: DC

## 2023-10-11 MED ORDER — ENOXAPARIN SODIUM 40 MG/0.4ML IJ SOSY
40.0000 mg | PREFILLED_SYRINGE | INTRAMUSCULAR | Status: DC
  Administered 2023-10-11: 40 mg via SUBCUTANEOUS
  Filled 2023-10-11: qty 0.4

## 2023-10-11 MED ORDER — POLYETHYLENE GLYCOL 3350 17 G PO PACK
17.0000 g | PACK | Freq: Every day | ORAL | Status: DC | PRN
  Administered 2023-10-12 – 2023-10-16 (×5): 17 g via ORAL
  Filled 2023-10-11 (×6): qty 1

## 2023-10-11 MED ORDER — OXYCODONE-ACETAMINOPHEN 5-325 MG PO TABS
1.0000 | ORAL_TABLET | Freq: Once | ORAL | Status: AC
  Administered 2023-10-11: 1 via ORAL
  Filled 2023-10-11: qty 1

## 2023-10-11 MED ORDER — ATORVASTATIN CALCIUM 10 MG PO TABS
10.0000 mg | ORAL_TABLET | Freq: Every day | ORAL | Status: DC
  Administered 2023-10-12 – 2023-10-16 (×5): 10 mg via ORAL
  Filled 2023-10-11 (×6): qty 1

## 2023-10-11 MED ORDER — ONDANSETRON HCL 4 MG/2ML IJ SOLN: 4.0000 mg | Freq: Four times a day (QID) | INTRAMUSCULAR | Status: DC | PRN

## 2023-10-11 MED ORDER — ALBUTEROL SULFATE (2.5 MG/3ML) 0.083% IN NEBU
2.5000 mg | INHALATION_SOLUTION | Freq: Four times a day (QID) | RESPIRATORY_TRACT | Status: DC | PRN
  Administered 2023-10-12 – 2023-10-13 (×2): 2.5 mg via RESPIRATORY_TRACT
  Filled 2023-10-11 (×2): qty 3

## 2023-10-11 MED ORDER — FAMOTIDINE 20 MG PO TABS
20.0000 mg | ORAL_TABLET | Freq: Every morning | ORAL | Status: DC
  Administered 2023-10-12 – 2023-10-16 (×5): 20 mg via ORAL
  Filled 2023-10-11 (×6): qty 1

## 2023-10-11 MED ORDER — POTASSIUM CHLORIDE CRYS ER 20 MEQ PO TBCR
40.0000 meq | EXTENDED_RELEASE_TABLET | Freq: Once | ORAL | Status: AC
  Administered 2023-10-11: 40 meq via ORAL
  Filled 2023-10-11: qty 2

## 2023-10-11 MED ORDER — ACETAMINOPHEN 325 MG PO TABS: 650.0000 mg | ORAL_TABLET | Freq: Four times a day (QID) | ORAL | Status: DC | PRN

## 2023-10-11 MED ORDER — DM-GUAIFENESIN ER 30-600 MG PO TB12: 1.0000 | ORAL_TABLET | Freq: Two times a day (BID) | ORAL | Status: DC

## 2023-10-11 MED ORDER — OXYCODONE HCL 5 MG PO TABS
5.0000 mg | ORAL_TABLET | Freq: Four times a day (QID) | ORAL | Status: DC | PRN
  Administered 2023-10-11: 5 mg via ORAL
  Filled 2023-10-11: qty 1

## 2023-10-11 MED ORDER — MIRTAZAPINE 7.5 MG PO TABS
7.5000 mg | ORAL_TABLET | Freq: Every day | ORAL | Status: DC
  Administered 2023-10-12 – 2023-10-15 (×4): 7.5 mg via ORAL
  Filled 2023-10-11 (×5): qty 1

## 2023-10-11 MED ORDER — OXYCODONE HCL 5 MG PO TABS
5.0000 mg | ORAL_TABLET | Freq: Once | ORAL | Status: AC
  Administered 2023-10-11: 5 mg via ORAL
  Filled 2023-10-11: qty 1

## 2023-10-11 MED ORDER — ONDANSETRON HCL 4 MG PO TABS: 4.0000 mg | ORAL_TABLET | Freq: Four times a day (QID) | ORAL | Status: DC | PRN

## 2023-10-11 MED ORDER — POTASSIUM CHLORIDE CRYS ER 20 MEQ PO TBCR
20.0000 meq | EXTENDED_RELEASE_TABLET | Freq: Every day | ORAL | Status: AC
  Administered 2023-10-11: 20 meq via ORAL
  Filled 2023-10-11: qty 1

## 2023-10-11 MED ORDER — KETOROLAC TROMETHAMINE 15 MG/ML IJ SOLN: 15.0000 mg | Freq: Once | INTRAMUSCULAR | Status: AC | PRN

## 2023-10-11 MED ORDER — OXYCODONE-ACETAMINOPHEN 5-325 MG PO TABS
1.0000 | ORAL_TABLET | ORAL | Status: DC | PRN
  Administered 2023-10-11 – 2023-10-16 (×23): 1 via ORAL
  Filled 2023-10-11 (×24): qty 1

## 2023-10-11 MED ORDER — DM-GUAIFENESIN ER 30-600 MG PO TB12: 1.0000 | ORAL_TABLET | Freq: Two times a day (BID) | ORAL | Status: DC | PRN

## 2023-10-11 MED ORDER — IPRATROPIUM-ALBUTEROL 0.5-2.5 (3) MG/3ML IN SOLN
3.0000 mL | Freq: Once | RESPIRATORY_TRACT | Status: AC
  Administered 2023-10-11: 3 mL via RESPIRATORY_TRACT
  Filled 2023-10-11: qty 3

## 2023-10-11 MED ORDER — IPRATROPIUM-ALBUTEROL 0.5-2.5 (3) MG/3ML IN SOLN
3.0000 mL | Freq: Four times a day (QID) | RESPIRATORY_TRACT | Status: DC
  Administered 2023-10-11 – 2023-10-15 (×15): 3 mL via RESPIRATORY_TRACT
  Filled 2023-10-11 (×13): qty 3

## 2023-10-11 MED ORDER — HYDROXYZINE HCL 25 MG PO TABS
25.0000 mg | ORAL_TABLET | Freq: Three times a day (TID) | ORAL | Status: DC | PRN
  Administered 2023-10-11: 25 mg via ORAL
  Filled 2023-10-11: qty 1

## 2023-10-11 MED ORDER — KETOROLAC TROMETHAMINE 15 MG/ML IJ SOLN
15.0000 mg | Freq: Once | INTRAMUSCULAR | Status: AC
  Administered 2023-10-11: 15 mg via INTRAVENOUS
  Filled 2023-10-11: qty 1

## 2023-10-11 MED ORDER — OXYCODONE HCL 5 MG PO TABS
5.0000 mg | ORAL_TABLET | ORAL | Status: DC | PRN
  Administered 2023-10-11: 5 mg via ORAL
  Filled 2023-10-11: qty 1

## 2023-10-11 MED ORDER — OXYCODONE HCL 5 MG PO TABS
5.0000 mg | ORAL_TABLET | ORAL | Status: DC | PRN
  Administered 2023-10-12 – 2023-10-16 (×21): 5 mg via ORAL
  Filled 2023-10-11 (×22): qty 1

## 2023-10-11 MED ORDER — ACETAMINOPHEN 650 MG RE SUPP: 650.0000 mg | Freq: Four times a day (QID) | RECTAL | Status: DC | PRN

## 2023-10-11 MED ORDER — NICOTINE 14 MG/24HR TD PT24
14.0000 mg | MEDICATED_PATCH | Freq: Every day | TRANSDERMAL | Status: DC
  Administered 2023-10-11 – 2023-10-16 (×6): 14 mg via TRANSDERMAL
  Filled 2023-10-11 (×6): qty 1

## 2023-10-11 NOTE — Progress Notes (Signed)
 [facility] admitting physician addendum:  The patient was threatening to leave AMA if her diet was not changed or if her dose of oxycodone  was not increased to 10 mg.  The patient weights about 45 kg and she told me that she uses one of her oxycodone  10 mg tablets once or twice a day, but not every day.  I told the nursing staff that I will not [place] on oxycodone  5 mg every 4 hours as needed.  She voiced understanding.  She also wanted to make sure that she gets her Ambien  5 mg tonight.  She has prescriptions for both of this.  UDS ordered.  Lucienne Ryder, MD.

## 2023-10-11 NOTE — ED Triage Notes (Signed)
 Pt BIBA from home w/ cc of SOB x1 wk and has increasingly gotten worse.  GCS 15 80-30-144/72-100%(DuoNeb) 18 RAC  125mg  Solu-Medrol  2g Mg DuoNeb x2

## 2023-10-11 NOTE — ED Provider Notes (Signed)
 [place] EMERGENCY DEPARTMENT AT [facility] Provider Note   CSN: [id number] Arrival date & time: 10/11/23  6237     History  Chief Complaint  Patient presents with   Shortness of Breath   HPI [name] is a 68 y.o. female with COPD with oxygen requirement, hypertension presenting for shortness of breath.  Has been going on for about a week and progressively worse.  It is worse with exertion not with lying flat.  Endorses chest tightness as well.  States she has been using her nebulizer at home and it has not been working.  Endorses a nonproductive cough as well but no fever.  EMS gave 125 mg of Solu-Medrol , 2 g of magnesium  and DuoNebs x 2.  She states at this time overall she is feeling better but still short of breath.   Shortness of Breath      Home Medications Prior to Admission medications   Medication Sig Start Date End Date Taking? Authorizing Provider  albuterol  (VENTOLIN  HFA) 108 (90 Base) MCG/ACT inhaler Inhale 2 puffs into the lungs every 4 (four) hours as needed for wheezing or shortness of breath. 07/24/23   Wilfredo Hanly, MD  atorvastatin  (LIPITOR) 10 MG tablet Take 10 mg by mouth daily. 03/20/23   [provider]  dextromethorphan -guaiFENesin  (MUCINEX  DM) 30-600 MG 12hr tablet Take 1 tablet by mouth 2 (two) times daily. 09/06/23   Rizwan, Saima, MD  feeding supplement (ENSURE ENLIVE / ENSURE PLUS) LIQD Take 237 mLs by mouth 3 (three) times daily between meals. 09/06/23   Rizwan, Saima, MD  fluticasone -salmeterol (ADVAIR  DISKUS) 250-50 MCG/ACT AEPB Inhale 1 puff into the lungs in the morning and at bedtime. 09/06/23   Rizwan, Saima, MD  ibuprofen  (ADVIL ) 800 MG tablet Take 800 mg by mouth every 6 (six) hours as needed for mild pain (pain score 1-3) or moderate pain (pain score 4-6). 05/06/23   [provider]  ipratropium-albuterol  (DUONEB) 0.5-2.5 (3) MG/3ML SOLN Take 3 mLs by nebulization 3 (three) times daily. Patient taking  differently: Take 3 mLs by nebulization every 6 (six) hours as needed. 07/24/23   Wilfredo Hanly, MD  Multiple Vitamin (MULTIVITAMIN) capsule Take 1 capsule by mouth daily.    [provider]  nicotine  (NICODERM CQ ) 7 mg/24hr patch Place 1 patch (7 mg total) onto the skin daily. 09/06/23   Rizwan, Saima, MD  oxyCODONE -acetaminophen  (PERCOCET) 10-325 MG tablet Take 1 tablet by mouth every 4 (four) hours as needed for pain. 12/23/20   [provider]  polyethylene glycol (MIRALAX  / GLYCOLAX ) 17 g packet Take 17 g by mouth daily as needed for mild constipation. Patient not taking: Reported on 09/05/2023 05/27/23   Kraig Peru, MD  Potassium Chloride  ER 20 MEQ TBCR Take 1 tablet by mouth daily. 06/12/23   [provider]  predniSONE  (DELTASONE ) 20 MG tablet Take 2 tablets (40 mg total) by mouth daily. 09/06/23   Rizwan, Saima, MD  zolpidem  (AMBIEN ) 5 MG tablet Take 5 mg by mouth at bedtime. 12/25/20   [provider]      Allergies    Patient has no known allergies.    Review of Systems   Review of Systems  Respiratory:  Positive for shortness of breath.     Physical Exam   Vitals:   10/11/23 0621 10/11/23 0802  BP: (!) 131/93   Pulse: 65   Resp: 20   Temp: (!) 97.3 F (36.3 C)   SpO2: 100% Aaron Aas)  86%    CONSTITUTIONAL:  well-appearing, NAD NEURO:  Alert and oriented x 3, CN 3-12 grossly intact EYES:  eyes equal and reactive ENT/NECK:  Supple, no stridor  CARDIO:  regular rate and rhythm, appears well-perfused  PULM: Tachypneic, subcostal retractions noted, on room air, diffuse expiratory wheezing noted, no crackles. GI/GU:  non-distended MSK/SPINE:  No gross deformities, no edema, moves all extremities  SKIN:  no rash, atraumatic  *Additional and/or pertinent findings included in MDM below   ED Results / Procedures / Treatments   Labs (all labs ordered are listed, but only abnormal results are displayed) Labs Reviewed  CBC WITH  DIFFERENTIAL/PLATELET - Abnormal; Notable for the following components:      Result Value   Hemoglobin 11.9 (*)    All other components within normal limits  COMPREHENSIVE METABOLIC PANEL WITH GFR - Abnormal; Notable for the following components:   Potassium 3.0 (*)    Chloride 97 (*)    All other components within normal limits  RESP PANEL BY RT-PCR (RSV, FLU A&B, COVID)  RVPGX2  BRAIN NATRIURETIC PEPTIDE  TROPONIN I (HIGH SENSITIVITY)    EKG EKG Interpretation Date/Time:  Friday Oct 11 2023 06:20:45 EDT Ventricular Rate:  71 PR Interval:  156 QRS Duration:  95 QT Interval:  398 QTC Calculation: 433 R Axis:   92  Text Interpretation: Sinus rhythm Atrial premature complexes Right axis deviation Consider left ventricular hypertrophy Anterior Q waves, possibly due to LVH No significant change since last tracing Confirmed by Albertus Hughs [phone]) on 10/11/2023 6:23:32 AM  Radiology DG Chest Port 1 View Result Date: 10/11/2023 CLINICAL DATA:  Shortness of breath. EXAM: PORTABLE CHEST 1 VIEW COMPARISON:  09/04/2023 FINDINGS: Lungs are hyperexpanded. The lungs are clear without focal pneumonia, edema, pneumothorax or pleural effusion. The cardiopericardial silhouette is within normal limits for size. No acute bony abnormality. Telemetry leads overlie the chest. IMPRESSION: Hyperexpansion without acute cardiopulmonary findings. Electronically Signed   By: Donnal Fusi M.D.   On: 10/11/2023 07:12    Procedures .Critical Care  Performed by: Janalee Mcmurray, PA-C Authorized by: Janalee Mcmurray, PA-C   Critical care provider statement:    Critical care time (minutes):  30   Critical care was necessary to treat or prevent imminent or life-threatening deterioration of the following conditions:  Respiratory failure (COPD exacerbation with hypoxia)   Critical care was time spent personally by me on the following activities:  Development of treatment plan with patient or surrogate, discussions  with consultants, evaluation of patient's response to treatment, examination of patient, ordering and review of laboratory studies, ordering and review of radiographic studies, ordering and performing treatments and interventions, pulse oximetry, re-evaluation of patient's condition and review of old charts     Medications Ordered in ED Medications  potassium chloride  SA (KLOR-CON  M) CR tablet 40 mEq (has no administration in time range)  ipratropium-albuterol  (DUONEB) 0.5-2.5 (3) MG/3ML nebulizer solution 3 mL (3 mLs Nebulization Given 10/11/23 0654)  oxyCODONE -acetaminophen  (PERCOCET/ROXICET) 5-325 MG per tablet 1 tablet (1 tablet Oral Given 10/11/23 0740)    And  oxyCODONE  (Oxy IR/ROXICODONE ) immediate release tablet 5 mg (5 mg Oral Given 10/11/23 0740)    ED Course/ Medical Decision Making/ A&P                                 Medical Decision Making Amount and/or Complexity of Data Reviewed Labs: ordered. Radiology: ordered.  Risk  Prescription drug management.   Initial Impression and Ddx 68 year old well-appearing female presenting for shortness of breath.  Exam notable for coarse breath sounds and diffuse expiratory wheezing with tachypnea and subcostal retractions.  DDx includes COPD exacerbation, CHF exacerbation, pneumonia, PE, ACS, other. Patient PMH that increases complexity of ED encounter:  COPD with hypoxia, HTN  Interpretation of Diagnostics - I independent reviewed and interpreted the labs as followed: hypokalemia (3.0)  - I independently visualized the following imaging with scope of interpretation limited to determining acute life threatening conditions related to emergency care: CXR, which revealed hyperexpansion without acute findings  -I personally reviewed and interpreted EKG which revealed sinus rhythm  Patient Reassessment and Ultimate Disposition/Management On reassessment, patient was asleep and hypoxic into the mid 80s.  Placed on oxygen.  She did appear  overall improved from a respiratory standpoint.  Workup does not suggest associated infection.  Admitted to hospital service with COPD exacerbation with hypoxia with Dr. Lucienne Ryder.  Patient management required discussion with the following services or consulting groups:  Hospitalist Service  Complexity of Problems Addressed Acute complicated illness or Injury  Additional Data Reviewed and Analyzed Further history obtained from: Past medical history and medications listed in the EMR and Prior ED visit notes  Patient Encounter Risk Assessment Consideration of hospitalization         Final Clinical Impression(s) / ED Diagnoses Final diagnoses:  COPD exacerbation [facility])    Rx / DC Orders ED Discharge Orders     None         Shmuel Girgis K, PA-C 10/11/23 0835    Albertus Hughs, DO 10/16/23 1502

## 2023-10-11 NOTE — Progress Notes (Signed)
  Echocardiogram 2D Echocardiogram has been performed.  Destyne Goodreau L Janeil Schexnayder RDCS 10/11/2023, 1:23 PM

## 2023-10-11 NOTE — H&P (Signed)
 History and Physical    Patient: [name] DOB: 03-09-1956 DOA: 10/11/2023 DOS: the patient was seen and examined on 10/11/2023 PCP: Terry Ficks, NP  Patient coming from: Home  Chief Complaint:  Chief Complaint  Patient presents with   Shortness of Breath   HPI: [name] is a 68 y.o. female with medical history significant of ulcerative colitis, asthma/COPD, chest pain, chronic back pain, sciatica, hypertension who is an active smoker who presented to the emergency department via EMS due to dyspnea associated with cough, wheezing, fatigue and worsening her chronic pleuritic chest pain.  Chest pain feels pressure-like.  It did not respond to her home medications.  The patient received 125 mg of Solu-Medrol , 2 DuoNebs and 2 g of magnesium  sulfate with EMS.  No travel history or sick contacts.  No fever, chills or night sweats. No sore throat, rhinorrhea or hemoptysis.  No palpitations, diaphoresis, PND, orthopnea or pitting edema of the lower extremities.  No appetite changes, abdominal pain, diarrhea, melena or hematochezia.  She gets occasional constipation.  No flank pain, dysuria, frequency or hematuria.  No polyuria, polydipsia, polyphagia or blurred vision.  Lab work: CBC showed a white count of 6.2, hemoglobin 11.9 g/dL platelets 562.  Coronavirus, influenza and RSV PCR test was negative.  Normal troponin and BNP.  CMP showed a potassium of 3.0 and chloride of 97 mmol/L, the rest of the CMP measurements were normal.  Imaging: Chest radiograph showed hyperinflation, but no infiltrates.   ED course: Initial vital signs were temperature 97.3 F, pulse 65, respiration 20, BP 131/93 mmHg O2 sat 88% on room air.  The patient received a DuoNeb, 1 tablet of Percocet 5 mg and 1 tablet of oxycodone  5 mg x 1.  Review of Systems: As mentioned in the history of present illness. All other systems reviewed and are negative.  Past Medical History:  Diagnosis Date    Arthritis    Asthma    Chest pain 2006   Chest pain 10/2016   Chronic back pain    Hypertension    Sciatica    Shortness of breath    due to  smoking    Past Surgical History:  Procedure Laterality Date   ABDOMINAL HYSTERECTOMY  1997   CARDIAC CATHETERIZATION  2006   HEMI-MICRODISCECTOMY LUMBAR LAMINECTOMY LEVEL 1  05/14/2012   Procedure: HEMI-MICRODISCECTOMY LUMBAR LAMINECTOMY LEVEL 1;  Surgeon: Florencia Hunter, MD;  Location: [facility];  Service: Orthopedics;  Laterality: N/A;  Hemi Laminectomy Microdiscectomy L4 - L5 Central (X-Ray)   ingrown toenail removal     TONSILLECTOMY     TOTAL HIP ARTHROPLASTY Left 11/05/2013   Procedure: LEFT TOTAL HIP ARTHROPLASTY;  Surgeon: Florencia Hunter, MD;  Location: [facility];  Service: Orthopedics;  Laterality: Left;   Social History:  reports that she has been smoking cigarettes. She has a 3 pack-year smoking history. She has never used smokeless tobacco. She reports that she does not drink alcohol and does not use drugs.  No Known Allergies  Family History  Problem Relation Age of Onset   Heart attack Mother 59   Hypertension Sister    Diabetes type II Sister    Tongue cancer Brother    Heart attack Sister 84   Lung cancer Sister    Hypertension Sister     Prior to Admission medications   Medication Sig Start Date End Date Taking? Authorizing Provider  albuterol  (VENTOLIN  HFA) 108 (90 Base) MCG/ACT inhaler Inhale 2  puffs into the lungs every 4 (four) hours as needed for wheezing or shortness of breath. 07/24/23   Wilfredo Hanly, MD  atorvastatin  (LIPITOR) 10 MG tablet Take 10 mg by mouth daily. 03/20/23   [provider]  dextromethorphan -guaiFENesin  (MUCINEX  DM) 30-600 MG 12hr tablet Take 1 tablet by mouth 2 (two) times daily. 09/06/23   Rizwan, Saima, MD  feeding supplement (ENSURE ENLIVE / ENSURE PLUS) LIQD Take 237 mLs by mouth 3 (three) times daily between meals. 09/06/23   Rizwan, Saima, MD  fluticasone -salmeterol (ADVAIR   DISKUS) 250-50 MCG/ACT AEPB Inhale 1 puff into the lungs in the morning and at bedtime. 09/06/23   Rizwan, Saima, MD  ibuprofen  (ADVIL ) 800 MG tablet Take 800 mg by mouth every 6 (six) hours as needed for mild pain (pain score 1-3) or moderate pain (pain score 4-6). 05/06/23   [provider]  ipratropium-albuterol  (DUONEB) 0.5-2.5 (3) MG/3ML SOLN Take 3 mLs by nebulization 3 (three) times daily. Patient taking differently: Take 3 mLs by nebulization every 6 (six) hours as needed. 07/24/23   Wilfredo Hanly, MD  Multiple Vitamin (MULTIVITAMIN) capsule Take 1 capsule by mouth daily.    [provider]  nicotine  (NICODERM CQ ) 7 mg/24hr patch Place 1 patch (7 mg total) onto the skin daily. 09/06/23   Rizwan, Saima, MD  oxyCODONE -acetaminophen  (PERCOCET) 10-325 MG tablet Take 1 tablet by mouth every 4 (four) hours as needed for pain. 12/23/20   [provider]  polyethylene glycol (MIRALAX  / GLYCOLAX ) 17 g packet Take 17 g by mouth daily as needed for mild constipation. Patient not taking: Reported on 09/05/2023 05/27/23   Kraig Peru, MD  Potassium Chloride  ER 20 MEQ TBCR Take 1 tablet by mouth daily. 06/12/23   [provider]  predniSONE  (DELTASONE ) 20 MG tablet Take 2 tablets (40 mg total) by mouth daily. 09/06/23   Rizwan, Saima, MD  zolpidem  (AMBIEN ) 5 MG tablet Take 5 mg by mouth at bedtime. 12/25/20   [provider]    Physical Exam: Vitals:   10/11/23 1610 10/11/23 0621 10/11/23 0631 10/11/23 0802  BP:  (!) 131/93    Pulse:  65    Resp:  20    Temp:  (!) 97.3 F (36.3 C)    TempSrc:  Axillary    SpO2: (!) 88% 100%  (!) 86%  Weight:   44.9 kg   Height:   5\' 1"  (1.549 m)    Physical Exam Vitals and nursing note reviewed.  Constitutional:      Appearance: She is well-developed. She is ill-appearing.  HENT:     Head: Normocephalic.     Nose: No rhinorrhea.     Mouth/Throat:     Mouth: Mucous membranes are dry.  Eyes:     General: No  scleral icterus.    Pupils: Pupils are equal, round, and reactive to light.  Neck:     Vascular: No JVD.  Cardiovascular:     Rate and Rhythm: Normal rate and regular rhythm.  Pulmonary:     Breath sounds: Decreased breath sounds and wheezing present. No rhonchi or rales.  Abdominal:     General: Bowel sounds are normal. There is no distension.     Palpations: Abdomen is soft.     Tenderness: There is no abdominal tenderness. There is no guarding.  Musculoskeletal:     Cervical back: Neck supple.     Right lower leg: No edema.     Left lower leg: No  edema.  Skin:    General: Skin is warm and dry.  Neurological:     General: No focal deficit present.     Mental Status: She is alert and oriented to person, place, and time.  Psychiatric:        Mood and Affect: Mood normal.        Behavior: Behavior normal.     Data Reviewed:  Results are pending, will review when available.  EKG: Vent. rate 71 BPM PR interval 156 ms QRS duration 95 ms QT/QTcB 398/433 ms P-R-T axes 96 92 71 Sinus rhythm Atrial premature complexes Right axis deviation Consider left ventricular hypertrophy Anterior Q waves, possibly due to LVH  Assessment and Plan: Principal Problem:   Acute respiratory failure with hypoxia (HCC) Superimposed on:   Chronic respiratory failure with hypoxia and hypercapnia (HCC) Due to:   COPD with acute exacerbation (HCC) In the setting of history of:   Centrilobular emphysema (HCC) On chronic O2 supplementation at home. Observation/telemetry Continue supplemental oxygen. Received methylprednisolone  125 mg IVP x1. This will be followed by prednisone  40 mg p.o. daily in a.m. Scheduled and as needed bronchodilators. Follow-up CBC and chemistry in the morning.  Advised to quit smoking.  Active Problems:   Tobacco use Tobacco cessation advised. Nicotine  replacement therapy ordered.    Hypokalemia Worsened by bed abdomen soft nebulizers. Potassium and  magnesium  have been supplemented.    HTN (hypertension) Monitor blood pressure. As needed antihypertensives.    Precordial chest pain   Musculoskeletal chest pain Has abnormal EKG. Has significant family history. -Mother and sister with CAD. Troponin level was normal. Will check an echocardiogram.     Advance Care Planning:   Code Status: Full Code   Consults:   Family Communication:   Severity of Illness: The appropriate patient status for this patient is OBSERVATION. Observation status is judged to be reasonable and necessary in order to provide the required intensity of service to ensure the patient's safety. The patient's presenting symptoms, physical exam findings, and initial radiographic and laboratory data in the context of their medical condition is felt to place them at decreased risk for further clinical deterioration. Furthermore, it is anticipated that the patient will be medically stable for discharge from the hospital within 2 midnights of admission.   Author: Danice Dural, MD 10/11/2023 8:38 AM  For on call review [email or web address].   This document was prepared using Dragon voice recognition software and may contain some unintended transcription errors.

## 2023-10-11 NOTE — ED Notes (Signed)
 Pt verbally consents to MSE.Electronic keypad unavailable at this time.

## 2023-10-12 DIAGNOSIS — J9601 Acute respiratory failure with hypoxia: Secondary | ICD-10-CM

## 2023-10-12 LAB — CBC
HCT: 42.8 % (ref 36.0–46.0)
Hemoglobin: 12.9 g/dL (ref 12.0–15.0)
MCH: 29.5 pg (ref 26.0–34.0)
MCHC: 30.1 g/dL (ref 30.0–36.0)
MCV: 97.7 fL (ref 80.0–100.0)
Platelets: 299 10*3/uL (ref 150–400)
RBC: 4.38 MIL/uL (ref 3.87–5.11)
RDW: 14 % (ref 11.5–15.5)
WBC: 9.4 10*3/uL (ref 4.0–10.5)
nRBC: 0 % (ref 0.0–0.2)

## 2023-10-12 LAB — BASIC METABOLIC PANEL WITH GFR
Anion gap: 10 (ref 5–15)
BUN: 17 mg/dL (ref 8–23)
CO2: 31 mmol/L (ref 22–32)
Calcium: 9.9 mg/dL (ref 8.9–10.3)
Chloride: 95 mmol/L — ABNORMAL LOW (ref 98–111)
Creatinine, Ser: 0.52 mg/dL (ref 0.44–1.00)
GFR, Estimated: >60 mL/min (ref >60)
Glucose, Bld: 78 mg/dL (ref 70–99)
Potassium: 3.6 mmol/L (ref 3.5–5.1)
Sodium: 136 mmol/L (ref 135–145)

## 2023-10-12 LAB — HIV ANTIBODY (ROUTINE TESTING W REFLEX): HIV Screen 4th Generation wRfx: NONREACTIVE

## 2023-10-12 LAB — RAPID URINE DRUG SCREEN, HOSP PERFORMED
Amphetamines: NOT DETECTED
Barbiturates: NOT DETECTED
Benzodiazepines: NOT DETECTED
Cocaine: NOT DETECTED
Opiates: POSITIVE — AB
Tetrahydrocannabinol: NOT DETECTED

## 2023-10-12 LAB — MAGNESIUM: Magnesium: 2.4 mg/dL (ref 1.7–2.4)

## 2023-10-12 MED ORDER — ENOXAPARIN SODIUM 30 MG/0.3ML IJ SOSY
30.0000 mg | PREFILLED_SYRINGE | INTRAMUSCULAR | Status: DC
  Administered 2023-10-12 – 2023-10-15 (×4): 30 mg via SUBCUTANEOUS
  Filled 2023-10-12 (×4): qty 0.3

## 2023-10-12 MED ORDER — ZOLPIDEM TARTRATE 5 MG PO TABS
5.0000 mg | ORAL_TABLET | Freq: Every day | ORAL | Status: DC
  Administered 2023-10-12 – 2023-10-14 (×3): 5 mg via ORAL
  Filled 2023-10-12 (×3): qty 1

## 2023-10-12 MED ORDER — ALUM & MAG HYDROXIDE-SIMETH 200-200-20 MG/5ML PO SUSP
30.0000 mL | ORAL | Status: DC | PRN
  Administered 2023-10-12: 30 mL via ORAL
  Filled 2023-10-12: qty 30

## 2023-10-12 MED ORDER — AZITHROMYCIN 250 MG PO TABS
500.0000 mg | ORAL_TABLET | Freq: Every day | ORAL | Status: AC
  Administered 2023-10-12 – 2023-10-16 (×5): 500 mg via ORAL
  Filled 2023-10-12 (×5): qty 2

## 2023-10-12 NOTE — Progress Notes (Signed)
 Pt placed on CPAP at this time with no O2 needed. Pt is tolerating well with no distress noted. Will continue to monitor    10/12/23 2220  BiPAP/CPAP/SIPAP  $ Non-Invasive Home Ventilator  Initial  $ Face Mask Small Yes  BiPAP/CPAP/SIPAP Pt Type Adult  BiPAP/CPAP/SIPAP Resmed  Mask Type Full face mask  Dentures removed? Not applicable  Mask Size Small  Patient Home Machine No  Patient Home Mask No  Patient Home Tubing No  Auto Titrate Yes  Minimum cmH2O 4 cmH2O  Maximum cmH2O 12 cmH2O  CPAP/SIPAP surface wiped down Yes  Device Plugged into RED Power Outlet Yes  BiPAP/CPAP /SiPAP Vitals  Resp 12  SpO2 97 %  MEWS Score/Color  MEWS Score 1  MEWS Score Color Green

## 2023-10-12 NOTE — Progress Notes (Signed)
 PROGRESS NOTE    [name]  AVW:[id number] DOB: [birth year] DOA: 10/11/2023 PCP: Terry Ficks, NP     Brief Narrative:  [name] is a 68 y.o. female with medical history significant of ulcerative colitis, asthma/COPD, chest pain, chronic back pain, sciatica, hypertension who is an active smoker who presented to the emergency department via EMS due to dyspnea associated with cough, wheezing, fatigue and worsening her chronic pleuritic chest pain.  Chest pain feels pressure-like.  It did not respond to her home medications.  The patient received 125 mg of Solu-Medrol , 2 DuoNebs and 2 g of magnesium  sulfate with EMS.  Patient was admitted for COPD exacerbation.  New events last 24 hours / Subjective: Multiple requests regarding her medications including her Percocet 10 mg - 325 mg every 4 hours as she takes at baseline which is prescribed by pain clinic.  Also asking for Remeron  (was already ordered in her chart but not given last night).  States that the green pill this morning made her sick (?Atarax ).  Also asking for regular diet and to wear CPAP tonight.  At rest, she is breathing well on room air, but states that she has significant symptoms with minimal activity.  Assessment & Plan:   Principal Problem:   Acute respiratory failure with hypoxia (HCC) Active Problems:   COPD with acute exacerbation (HCC)   Chronic respiratory failure with hypoxia and hypercapnia (HCC)   Hypokalemia   HTN (hypertension)   Musculoskeletal chest pain   Tobacco use   Centrilobular emphysema (HCC)   Precordial chest pain  Acute on chronic hypoxic respiratory failure secondary to COPD exacerbation - Uses 3 L nasal cannula O2 on as needed basis at baseline - Currently on room air - Continue prednisone , azithromycin , breathing treatments  Chronic pain - Continue pain medications as needed  Hyperlipidemia - Lipitor  Insomnia - Continue Ambien   OSA - CPAP nightly  Noncardiac chest  pain - Troponin negative, echocardiogram normal  Tobacco use - Cessation counseling - Nicotine  patch   DVT prophylaxis:  enoxaparin  (LOVENOX ) injection 40 mg Start: 10/11/23 2200  Code Status: Full code Family Communication: None at bedside Disposition Plan: Home Status is: Observation The patient will require care spanning > 2 midnights and should be moved to inpatient because: Respiratory failure    Antimicrobials:  Anti-infectives (From admission, onward)    Start     Dose/Rate Route Frequency Ordered Stop   10/12/23 1100  azithromycin  (ZITHROMAX ) tablet 500 mg        500 mg Oral Daily 10/12/23 1005 10/17/23 0959        Objective: Vitals:   10/12/23 0515 10/12/23 0750 10/12/23 1148 10/12/23 1200  BP: 122/81   120/80  Pulse: 66   70  Resp:      Temp: 98.7 F (37.1 C)   98.9 F (37.2 C)  TempSrc: Oral   Oral  SpO2: 97% 100% 94% 100%  Weight:      Height:        Intake/Output Summary (Last 24 hours) at 10/12/2023 1346 Last data filed at 10/12/2023 0820 Gross per 24 hour  Intake 1140 ml  Output 150 ml  Net 990 ml   Filed Weights   10/11/23 0631  Weight: 44.9 kg    Examination:  General exam: Appears calm and comfortable  Respiratory system: Diminished breath sounds bilaterally, respiratory effort normal, on room air without conversational dyspnea Cardiovascular system: S1 & S2 heard, RRR. No murmurs. No pedal edema. Gastrointestinal  system: Abdomen is nondistended, soft and nontender. Normal bowel sounds heard. Central nervous system: Alert and oriented. No focal neurological deficits. Speech clear.  Extremities: Symmetric in appearance  Skin: No rashes, lesions or ulcers on exposed skin  Psychiatry: Judgement and insight appear normal. Mood & affect appropriate.   Data Reviewed: I have personally reviewed following labs and imaging studies  CBC: Recent Labs  Lab 10/11/23 0638 10/12/23 1116  WBC 6.2 9.4  NEUTROABS 2.0  --   HGB 11.9* 12.9  HCT  38.3 42.8  MCV 96.5 97.7  PLT 249 299   Basic Metabolic Panel: Recent Labs  Lab 10/11/23 0638 10/12/23 1116  NA 136 136  K 3.0* 3.6  CL 97* 95*  CO2 31 31  GLUCOSE 95 78  BUN 16 17  CREATININE 0.61 0.52  CALCIUM  9.0 9.9  MG  --  2.4   GFR: Estimated Creatinine Clearance: 46.4 mL/min (by C-G formula based on SCr of 0.52 mg/dL). Liver Function Tests: Recent Labs  Lab 10/11/23 0638  AST 17  ALT 9  ALKPHOS 41  BILITOT 0.6  PROT 6.5  ALBUMIN 4.0   No results for input(s): "LIPASE", "AMYLASE" in the last 168 hours. No results for input(s): "AMMONIA" in the last 168 hours. Coagulation Profile: No results for input(s): "INR", "PROTIME" in the last 168 hours. Cardiac Enzymes: No results for input(s): "CKTOTAL", "CKMB", "CKMBINDEX", "TROPONINI" in the last 168 hours. BNP (last 3 results) No results for input(s): "PROBNP" in the last 8760 hours. HbA1C: No results for input(s): "HGBA1C" in the last 72 hours. CBG: No results for input(s): "GLUCAP" in the last 168 hours. Lipid Profile: No results for input(s): "CHOL", "HDL", "LDLCALC", "TRIG", "CHOLHDL", "LDLDIRECT" in the last 72 hours. Thyroid  Function Tests: No results for input(s): "TSH", "T4TOTAL", "FREET4", "T3FREE", "THYROIDAB" in the last 72 hours. Anemia Panel: No results for input(s): "VITAMINB12", "FOLATE", "FERRITIN", "TIBC", "IRON", "RETICCTPCT" in the last 72 hours. Sepsis Labs: No results for input(s): "PROCALCITON", "LATICACIDVEN" in the last 168 hours.  Recent Results (from the past 240 hours)  Resp panel by RT-PCR (RSV, Flu A&B, Covid) Anterior Nasal Swab     Status: None   Collection Time: 10/11/23  6:37 AM   Specimen: Anterior Nasal Swab  Result Value Ref Range Status   SARS Coronavirus 2 by RT PCR NEGATIVE NEGATIVE Final    Comment: (NOTE) SARS-CoV-2 target nucleic acids are NOT DETECTED.  The SARS-CoV-2 RNA is generally detectable in upper respiratory specimens during the acute phase of  infection. The lowest concentration of SARS-CoV-2 viral copies this assay can detect is 138 copies/mL. A negative result does not preclude SARS-Cov-2 infection and should not be used as the sole basis for treatment or other patient management decisions. A negative result may occur with  improper specimen collection/handling, submission of specimen other than nasopharyngeal swab, presence of viral mutation(s) within the areas targeted by this assay, and inadequate number of viral copies(<138 copies/mL). A negative result must be combined with clinical observations, patient history, and epidemiological information. The expected result is Negative.  Fact Sheet for Patients:  BloggerCourse.com  Fact Sheet for Healthcare Providers:  SeriousBroker.it  This test is no t yet approved or cleared by the [place]  FDA and  has been authorized for detection and/or diagnosis of SARS-CoV-2 by FDA under an Emergency Use Authorization (EUA). This EUA will remain  in effect (meaning this test can be used) for the duration of the COVID-19 declaration under Section 564(b)(1) of the Act, 21  U.S.C.section 360bbb-3(b)(1), unless the authorization is terminated  or revoked sooner.       Influenza A by PCR NEGATIVE NEGATIVE Final   Influenza B by PCR NEGATIVE NEGATIVE Final    Comment: (NOTE) The Xpert Xpress SARS-CoV-2/FLU/RSV plus assay is intended as an aid in the diagnosis of influenza from Nasopharyngeal swab specimens and should not be used as a sole basis for treatment. Nasal washings and aspirates are unacceptable for Xpert Xpress SARS-CoV-2/FLU/RSV testing.  Fact Sheet for Patients: BloggerCourse.com  Fact Sheet for Healthcare Providers: SeriousBroker.it  This test is not yet approved or cleared by the [place]  FDA and has been authorized for detection and/or diagnosis of SARS-CoV-2  by FDA under an Emergency Use Authorization (EUA). This EUA will remain in effect (meaning this test can be used) for the duration of the COVID-19 declaration under Section 564(b)(1) of the Act, 21 U.S.C. section 360bbb-3(b)(1), unless the authorization is terminated or revoked.     Resp Syncytial Virus by PCR NEGATIVE NEGATIVE Final    Comment: (NOTE) Fact Sheet for Patients: BloggerCourse.com  Fact Sheet for Healthcare Providers: SeriousBroker.it  This test is not yet approved or cleared by the [place]  FDA and has been authorized for detection and/or diagnosis of SARS-CoV-2 by FDA under an Emergency Use Authorization (EUA). This EUA will remain in effect (meaning this test can be used) for the duration of the COVID-19 declaration under Section 564(b)(1) of the Act, 21 U.S.C. section 360bbb-3(b)(1), unless the authorization is terminated or revoked.  Performed at [facility], [address], [place], [place] [address]       Radiology Studies: ECHOCARDIOGRAM COMPLETE Result Date: 10/11/2023    ECHOCARDIOGRAM REPORT   Patient Name:   SORINA DERRIG Date of Exam: 10/11/2023 Medical Rec #:  [id number]      Height:       61.0 in Accession #:    [id number]     Weight:       99.0 lb Date of Birth:  10/08/1955      BSA:          1.401 m Patient Age:    68 years       BP:           117/77 mmHg Patient Gender: F              HR:           75 bpm. Exam Location:  Inpatient Procedure: 2D Echo, Cardiac Doppler and Color Doppler (Both Spectral and Color            Flow Doppler were utilized during procedure). Indications:    Chest pain, abnormal ECG  History:        Patient has prior history of Echocardiogram examinations, most                 recent 01/24/2023. COPD, Signs/Symptoms:Chest Pain; Risk                 Factors:Current Smoker and Hypertension.  Sonographer:    Juanita Shaw Referring Phys: [id number] DAVID MANUEL ORTIZ   Sonographer Comments: Image acquisition challenging due to uncooperative patient. IMPRESSIONS  1. Left ventricular ejection fraction, by estimation, is 60 to 65%. The left ventricle has normal function. The left ventricle has no regional wall motion abnormalities. Left ventricular diastolic function could not be evaluated.  2. Peak RV-RA gradient 26 mmHg. Right ventricular systolic function is normal. The right ventricular size is normal.  3.  The mitral valve is normal in structure. No evidence of mitral valve regurgitation. No evidence of mitral stenosis.  4. The aortic valve is tricuspid. Aortic valve regurgitation is not visualized. No aortic stenosis is present.  5. IVC not visualized.  6. Echo stopped by patient after only 1 apical image. Study incomplete. FINDINGS  Left Ventricle: Left ventricular ejection fraction, by estimation, is 60 to 65%. The left ventricle has normal function. The left ventricle has no regional wall motion abnormalities. The left ventricular internal cavity size was normal in size. There is  no left ventricular hypertrophy. Left ventricular diastolic function could not be evaluated. Right Ventricle: Peak RV-RA gradient 26 mmHg. The right ventricular size is normal. No increase in right ventricular wall thickness. Right ventricular systolic function is normal. Pericardium: There is no evidence of pericardial effusion. Mitral Valve: The mitral valve is normal in structure. No evidence of mitral valve regurgitation. No evidence of mitral valve stenosis. Tricuspid Valve: The tricuspid valve is normal in structure. Tricuspid valve regurgitation is mild. Aortic Valve: The aortic valve is tricuspid. Aortic valve regurgitation is not visualized. No aortic stenosis is present. Pulmonic Valve: The pulmonic valve was normal in structure. Pulmonic valve regurgitation is trivial. Aorta: The aortic root is normal in size and structure. Venous: IVC not visualized.  LEFT VENTRICLE PLAX 2D LVIDd:          4.30 cm LVIDs:         1.90 cm LV PW:         0.70 cm LV IVS:        0.80 cm LVOT diam:     1.80 cm LVOT Area:     2.54 cm  LV Volumes (MOD) LV vol d, MOD A4C: 67.8 ml LV vol s, MOD A4C: 20.2 ml LV SV MOD A4C:     67.8 ml LEFT ATRIUM         Index LA diam:    1.70 cm 1.21 cm/m                        PULMONIC VALVE AORTA                 PV Vmax:       1.14 m/s Ao Root diam: 3.30 cm PV Peak grad:  5.2 mmHg Ao Asc diam:  2.90 cm  TRICUSPID VALVE TR Peak grad:   26.8 mmHg TR Vmax:        259.00 cm/s  SHUNTS Systemic Diam: 1.80 cm Dalton McleanMD Electronically signed by Archer Bear Signature Date/Time: 10/11/2023/3:47:26 PM    Final    DG Chest Port 1 View Result Date: 10/11/2023 CLINICAL DATA:  Shortness of breath. EXAM: PORTABLE CHEST 1 VIEW COMPARISON:  09/04/2023 FINDINGS: Lungs are hyperexpanded. The lungs are clear without focal pneumonia, edema, pneumothorax or pleural effusion. The cardiopericardial silhouette is within normal limits for size. No acute bony abnormality. Telemetry leads overlie the chest. IMPRESSION: Hyperexpansion without acute cardiopulmonary findings. Electronically Signed   By: Donnal Fusi M.D.   On: 10/11/2023 07:12      Scheduled Meds:  atorvastatin   10 mg Oral Daily   azithromycin   500 mg Oral Daily   enoxaparin  (LOVENOX ) injection  40 mg Subcutaneous Q24H   famotidine   20 mg Oral q morning   feeding supplement  237 mL Oral TID BM   ipratropium-albuterol   3 mL Nebulization QID   mirtazapine   7.5 mg Oral QHS   nicotine   14  mg Transdermal Daily   predniSONE   40 mg Oral Q breakfast   Continuous Infusions:   LOS: 0 days   Time spent: 35 minutes   Daren Eck, DO [facility] 10/12/2023, 1:46 PM   Available via Epic secure chat 7am-7pm After these hours, please refer to coverage provider listed on [email or web address]

## 2023-10-12 NOTE — Care Management Obs Status (Signed)
 MEDICARE OBSERVATION STATUS NOTIFICATION   Patient Details  Name: [name] MRN: [id number] Date of Birth: 1955-07-05   Medicare Observation Status Notification Given:  Yes    Amaryllis Junior, LCSW 10/12/2023, 3:29 PM

## 2023-10-13 DIAGNOSIS — J9601 Acute respiratory failure with hypoxia: Secondary | ICD-10-CM | POA: Diagnosis not present

## 2023-10-13 NOTE — Progress Notes (Signed)
   10/13/23 0936  TOC Brief Assessment  Insurance and Status Reviewed  Patient has primary care physician Yes  Home environment has been reviewed apartment  Prior level of function: independent  Prior/Current Home Services No current home services  Social Drivers of Health Review SDOH reviewed no interventions necessary  Readmission risk has been reviewed Yes  Transition of care needs transition of care needs identified, TOC will continue to follow    TOC will continue to follow for discharge needs, specifically O2.   [place], MSW, LCSW 10/13/2023 9:37 AM

## 2023-10-13 NOTE — Progress Notes (Signed)
 PROGRESS NOTE    [name]  ZOX:[id number] DOB: [birth year] DOA: 10/11/2023 PCP: Terry Ficks, NP     Brief Narrative:  [name] is a 68 y.o. female with medical history significant of ulcerative colitis, asthma/COPD, chest pain, chronic back pain, sciatica, hypertension who is an active smoker who presented to the emergency department via EMS due to dyspnea associated with cough, wheezing, fatigue and worsening her chronic pleuritic chest pain.  Chest pain feels pressure-like.  It did not respond to her home medications.  The patient received 125 mg of Solu-Medrol , 2 DuoNebs and 2 g of magnesium  sulfate with EMS.  Patient was admitted for COPD exacerbation.  New events last 24 hours / Subjective: She got up to the bedside table and became dyspneic.  Currently not at her baseline respiratory status.  Assessment & Plan:   Principal Problem:   Acute respiratory failure with hypoxia (HCC) Active Problems:   COPD with acute exacerbation (HCC)   Chronic respiratory failure with hypoxia and hypercapnia (HCC)   Hypokalemia   HTN (hypertension)   Musculoskeletal chest pain   Tobacco use   Centrilobular emphysema (HCC)   Precordial chest pain  Acute on chronic hypoxic respiratory failure secondary to COPD exacerbation - Uses 3 L nasal cannula O2 on as needed basis at baseline - Currently on nasal cannula O2 - Continue prednisone , azithromycin , breathing treatments - Not at baseline  Chronic pain - Continue pain medications as needed  Hyperlipidemia - Lipitor  Insomnia - Continue Ambien   OSA - CPAP nightly  Noncardiac chest pain - Troponin negative, echocardiogram normal  Tobacco use - Cessation counseling - Nicotine  patch   DVT prophylaxis:  enoxaparin  (LOVENOX ) injection 30 mg Start: 10/12/23 2200  Code Status: Full code Family Communication: None at bedside Disposition Plan: Home Status is: Observation The patient will require care spanning > 2  midnights and should be moved to inpatient because: Respiratory failure    Antimicrobials:  Anti-infectives (From admission, onward)    Start     Dose/Rate Route Frequency Ordered Stop   10/12/23 1100  azithromycin  (ZITHROMAX ) tablet 500 mg        500 mg Oral Daily 10/12/23 1005 10/17/23 0959        Objective: Vitals:   10/13/23 0051 10/13/23 0150 10/13/23 0532 10/13/23 0841  BP:   104/68   Pulse:   60   Resp: 14     Temp:   98.2 F (36.8 C)   TempSrc:   Oral   SpO2:  97% 100% 99%  Weight:      Height:        Intake/Output Summary (Last 24 hours) at 10/13/2023 1054 Last data filed at 10/13/2023 1042 Gross per 24 hour  Intake 240 ml  Output --  Net 240 ml   Filed Weights   10/11/23 0631  Weight: 44.9 kg    Examination:  General exam: Appears calm and comfortable  Respiratory system: Moving more air today, bilateral expiratory wheezes with mild conversational dyspnea Cardiovascular system: S1 & S2 heard, RRR. No murmurs. No pedal edema. Gastrointestinal system: Abdomen is nondistended, soft and nontender. Normal bowel sounds heard. Central nervous system: Alert and oriented. No focal neurological deficits. Speech clear.  Extremities: Symmetric in appearance  Skin: No rashes, lesions or ulcers on exposed skin  Psychiatry: Judgement and insight appear normal. Mood & affect appropriate.   Data Reviewed: I have personally reviewed following labs and imaging studies  CBC: Recent Labs  Lab 10/11/23  9811 10/12/23 1116  WBC 6.2 9.4  NEUTROABS 2.0  --   HGB 11.9* 12.9  HCT 38.3 42.8  MCV 96.5 97.7  PLT 249 299   Basic Metabolic Panel: Recent Labs  Lab 10/11/23 0638 10/12/23 1116  NA 136 136  K 3.0* 3.6  CL 97* 95*  CO2 31 31  GLUCOSE 95 78  BUN 16 17  CREATININE 0.61 0.52  CALCIUM  9.0 9.9  MG  --  2.4   GFR: Estimated Creatinine Clearance: 46.4 mL/min (by C-G formula based on SCr of 0.52 mg/dL). Liver Function Tests: Recent Labs  Lab  10/11/23 0638  AST 17  ALT 9  ALKPHOS 41  BILITOT 0.6  PROT 6.5  ALBUMIN 4.0   No results for input(s): "LIPASE", "AMYLASE" in the last 168 hours. No results for input(s): "AMMONIA" in the last 168 hours. Coagulation Profile: No results for input(s): "INR", "PROTIME" in the last 168 hours. Cardiac Enzymes: No results for input(s): "CKTOTAL", "CKMB", "CKMBINDEX", "TROPONINI" in the last 168 hours. BNP (last 3 results) No results for input(s): "PROBNP" in the last 8760 hours. HbA1C: No results for input(s): "HGBA1C" in the last 72 hours. CBG: No results for input(s): "GLUCAP" in the last 168 hours. Lipid Profile: No results for input(s): "CHOL", "HDL", "LDLCALC", "TRIG", "CHOLHDL", "LDLDIRECT" in the last 72 hours. Thyroid  Function Tests: No results for input(s): "TSH", "T4TOTAL", "FREET4", "T3FREE", "THYROIDAB" in the last 72 hours. Anemia Panel: No results for input(s): "VITAMINB12", "FOLATE", "FERRITIN", "TIBC", "IRON", "RETICCTPCT" in the last 72 hours. Sepsis Labs: No results for input(s): "PROCALCITON", "LATICACIDVEN" in the last 168 hours.  Recent Results (from the past 240 hours)  Resp panel by RT-PCR (RSV, Flu A&B, Covid) Anterior Nasal Swab     Status: None   Collection Time: 10/11/23  6:37 AM   Specimen: Anterior Nasal Swab  Result Value Ref Range Status   SARS Coronavirus 2 by RT PCR NEGATIVE NEGATIVE Final    Comment: (NOTE) SARS-CoV-2 target nucleic acids are NOT DETECTED.  The SARS-CoV-2 RNA is generally detectable in upper respiratory specimens during the acute phase of infection. The lowest concentration of SARS-CoV-2 viral copies this assay can detect is 138 copies/mL. A negative result does not preclude SARS-Cov-2 infection and should not be used as the sole basis for treatment or other patient management decisions. A negative result may occur with  improper specimen collection/handling, submission of specimen other than nasopharyngeal swab, presence of  viral mutation(s) within the areas targeted by this assay, and inadequate number of viral copies(<138 copies/mL). A negative result must be combined with clinical observations, patient history, and epidemiological information. The expected result is Negative.  Fact Sheet for Patients:  BloggerCourse.com  Fact Sheet for Healthcare Providers:  SeriousBroker.it  This test is no t yet approved or cleared by the [place]  FDA and  has been authorized for detection and/or diagnosis of SARS-CoV-2 by FDA under an Emergency Use Authorization (EUA). This EUA will remain  in effect (meaning this test can be used) for the duration of the COVID-19 declaration under Section 564(b)(1) of the Act, 21 U.S.C.section 360bbb-3(b)(1), unless the authorization is terminated  or revoked sooner.       Influenza A by PCR NEGATIVE NEGATIVE Final   Influenza B by PCR NEGATIVE NEGATIVE Final    Comment: (NOTE) The Xpert Xpress SARS-CoV-2/FLU/RSV plus assay is intended as an aid in the diagnosis of influenza from Nasopharyngeal swab specimens and should not be used as a sole basis for treatment.  Nasal washings and aspirates are unacceptable for Xpert Xpress SARS-CoV-2/FLU/RSV testing.  Fact Sheet for Patients: BloggerCourse.com  Fact Sheet for Healthcare Providers: SeriousBroker.it  This test is not yet approved or cleared by the [place]  FDA and has been authorized for detection and/or diagnosis of SARS-CoV-2 by FDA under an Emergency Use Authorization (EUA). This EUA will remain in effect (meaning this test can be used) for the duration of the COVID-19 declaration under Section 564(b)(1) of the Act, 21 U.S.C. section 360bbb-3(b)(1), unless the authorization is terminated or revoked.     Resp Syncytial Virus by PCR NEGATIVE NEGATIVE Final    Comment: (NOTE) Fact Sheet for  Patients: BloggerCourse.com  Fact Sheet for Healthcare Providers: SeriousBroker.it  This test is not yet approved or cleared by the [place]  FDA and has been authorized for detection and/or diagnosis of SARS-CoV-2 by FDA under an Emergency Use Authorization (EUA). This EUA will remain in effect (meaning this test can be used) for the duration of the COVID-19 declaration under Section 564(b)(1) of the Act, 21 U.S.C. section 360bbb-3(b)(1), unless the authorization is terminated or revoked.  Performed at [facility], [address], [place], [place] [address]       Radiology Studies: ECHOCARDIOGRAM COMPLETE Result Date: 10/11/2023    ECHOCARDIOGRAM REPORT   Patient Name:   [name] Date of Exam: 10/11/2023 Medical Rec #:  [id number]      Height:       61.0 in Accession #:    [id number]     Weight:       99.0 lb Date of Birth:  10/05/1955      BSA:          1.401 m Patient Age:    68 years       BP:           117/77 mmHg Patient Gender: F              HR:           75 bpm. Exam Location:  Inpatient Procedure: 2D Echo, Cardiac Doppler and Color Doppler (Both Spectral and Color            Flow Doppler were utilized during procedure). Indications:    Chest pain, abnormal ECG  History:        Patient has prior history of Echocardiogram examinations, most                 recent 01/24/2023. COPD, Signs/Symptoms:Chest Pain; Risk                 Factors:Current Smoker and Hypertension.  Sonographer:    Juanita Shaw Referring Phys: [id number] DAVID MANUEL ORTIZ  Sonographer Comments: Image acquisition challenging due to uncooperative patient. IMPRESSIONS  1. Left ventricular ejection fraction, by estimation, is 60 to 65%. The left ventricle has normal function. The left ventricle has no regional wall motion abnormalities. Left ventricular diastolic function could not be evaluated.  2. Peak RV-RA gradient 26 mmHg. Right ventricular  systolic function is normal. The right ventricular size is normal.  3. The mitral valve is normal in structure. No evidence of mitral valve regurgitation. No evidence of mitral stenosis.  4. The aortic valve is tricuspid. Aortic valve regurgitation is not visualized. No aortic stenosis is present.  5. IVC not visualized.  6. Echo stopped by patient after only 1 apical image. Study incomplete. FINDINGS  Left Ventricle: Left ventricular ejection fraction, by estimation, is 60 to 65%. The  left ventricle has normal function. The left ventricle has no regional wall motion abnormalities. The left ventricular internal cavity size was normal in size. There is  no left ventricular hypertrophy. Left ventricular diastolic function could not be evaluated. Right Ventricle: Peak RV-RA gradient 26 mmHg. The right ventricular size is normal. No increase in right ventricular wall thickness. Right ventricular systolic function is normal. Pericardium: There is no evidence of pericardial effusion. Mitral Valve: The mitral valve is normal in structure. No evidence of mitral valve regurgitation. No evidence of mitral valve stenosis. Tricuspid Valve: The tricuspid valve is normal in structure. Tricuspid valve regurgitation is mild. Aortic Valve: The aortic valve is tricuspid. Aortic valve regurgitation is not visualized. No aortic stenosis is present. Pulmonic Valve: The pulmonic valve was normal in structure. Pulmonic valve regurgitation is trivial. Aorta: The aortic root is normal in size and structure. Venous: IVC not visualized.  LEFT VENTRICLE PLAX 2D LVIDd:         4.30 cm LVIDs:         1.90 cm LV PW:         0.70 cm LV IVS:        0.80 cm LVOT diam:     1.80 cm LVOT Area:     2.54 cm  LV Volumes (MOD) LV vol d, MOD A4C: 67.8 ml LV vol s, MOD A4C: 20.2 ml LV SV MOD A4C:     67.8 ml LEFT ATRIUM         Index LA diam:    1.70 cm 1.21 cm/m                        PULMONIC VALVE AORTA                 PV Vmax:       1.14 m/s Ao Root  diam: 3.30 cm PV Peak grad:  5.2 mmHg Ao Asc diam:  2.90 cm  TRICUSPID VALVE TR Peak grad:   26.8 mmHg TR Vmax:        259.00 cm/s  SHUNTS Systemic Diam: 1.80 cm Dalton McleanMD Electronically signed by Archer Bear Signature Date/Time: 10/11/2023/3:47:26 PM    Final       Scheduled Meds:  atorvastatin   10 mg Oral Daily   azithromycin   500 mg Oral Daily   enoxaparin  (LOVENOX ) injection  30 mg Subcutaneous Q24H   famotidine   20 mg Oral q morning   feeding supplement  237 mL Oral TID BM   ipratropium-albuterol   3 mL Nebulization QID   mirtazapine   7.5 mg Oral QHS   nicotine   14 mg Transdermal Daily   predniSONE   40 mg Oral Q breakfast   zolpidem   5 mg Oral QHS   Continuous Infusions:   LOS: 0 days   Time spent: 25 minutes   Daren Eck, DO [facility] 10/13/2023, 10:54 AM   Available via Epic secure chat 7am-7pm After these hours, please refer to coverage provider listed on [email or web address]

## 2023-10-13 NOTE — Evaluation (Signed)
 Physical Therapy Evaluation Patient Details Name: [name] MRN: [id number] DOB: May 03, 1956 Today's Date: 10/13/2023  History of Present Illness  68 yo female admitted with acute on chronic resp failure. Hx of COPD-wears O2 at baseline PRN, colitis, chronic pain, sciatica, L THA  Clinical Impression  On eval, pt was CGA for mobility. She walked ~40 feet in the room without a device-mildly unsteady. Dyspnea 2/4. O2 89-92% on RA (pt on room air upon my arrival)- [place] O2 within her reach. Will plan to follow and progress activity as tolerated. Will recommend HHPT f/u if pt is agreeable to it. Also recommend daily ambulation with nursing and/or mobility team as able.        If plan is discharge home, recommend the following: A little help with walking and/or transfers;A little help with bathing/dressing/bathroom;Assistance with cooking/housework;Assist for transportation;Help with stairs or ramp for entrance   Can travel by private vehicle        Equipment Recommendations None recommended by PT  Recommendations for Other Services       Functional Status Assessment Patient has had a recent decline in their functional status and demonstrates the ability to make significant improvements in function in a reasonable and predictable amount of time.     Precautions / Restrictions Precautions Precautions: Fall Precaution/Restrictions Comments: monitor O2 Restrictions Weight Bearing Restrictions Per Provider Order: No      Mobility  Bed Mobility Overal bed mobility: Modified Independent                  Transfers Overall transfer level: Needs assistance   Transfers: Sit to/from Stand Sit to Stand: Supervision           General transfer comment: Supv for safety.    Ambulation/Gait Ambulation/Gait assistance: Contact guard assist Gait Distance (Feet): 40 Feet Assistive device: None Gait Pattern/deviations: Step-through pattern, Decreased stride length        General Gait Details: Pt took a couple laps around the room. O2 89-92% on RA with ambulation. Dyspnea 2/4. Pt intermittently reaching out for objects in the environment to steady herself  Stairs            Wheelchair Mobility     Tilt Bed    Modified Rankin (Stroke Patients Only)       Balance Overall balance assessment: Needs assistance         Standing balance support: During functional activity Standing balance-Leahy Scale: Fair                               Pertinent Vitals/Pain Pain Assessment Pain Assessment: No/denies pain    Home Living Family/patient expects to be discharged to:: Private residence Living Arrangements: Alone   Type of Home: Apartment Home Access: Level entry       Home Layout: One level Home Equipment: Cane - single point      Prior Function Prior Level of Function : Independent/Modified Independent                     Extremity/Trunk Assessment   Upper Extremity Assessment Upper Extremity Assessment: Overall WFL for tasks assessed    Lower Extremity Assessment Lower Extremity Assessment: Generalized weakness    Cervical / Trunk Assessment Cervical / Trunk Assessment: Normal  Communication   Communication Communication: No apparent difficulties    Cognition Arousal: Alert Behavior During Therapy: WFL for tasks assessed/performed   PT - Cognitive impairments: No apparent  impairments                         Following commands: Intact       Cueing Cueing Techniques: Verbal cues     General Comments      Exercises     Assessment/Plan    PT Assessment Patient needs continued PT services  PT Problem List Decreased strength;Decreased range of motion;Decreased activity tolerance;Decreased balance;Decreased mobility;Decreased knowledge of use of DME       PT Treatment Interventions DME instruction;Gait training;Functional mobility training;Therapeutic activities;Therapeutic  exercise;Patient/family education;Balance training    PT Goals (Current goals can be found in the Care Plan section)  Acute Rehab PT Goals Patient Stated Goal: home soon PT Goal Formulation: With patient Time For Goal Achievement: 10/27/23 Potential to Achieve Goals: Good    Frequency Min 3X/week     Co-evaluation               AM-PAC PT "6 Clicks" Mobility  Outcome Measure Help needed turning from your back to your side while in a flat bed without using bedrails?: None Help needed moving from lying on your back to sitting on the side of a flat bed without using bedrails?: None Help needed moving to and from a bed to a chair (including a wheelchair)?: None Help needed standing up from a chair using your arms (e.g., wheelchair or bedside chair)?: None Help needed to walk in hospital room?: A Little Help needed climbing 3-5 steps with a railing? : A Little 6 Click Score: 22    End of Session   Activity Tolerance: Patient tolerated treatment well Patient left: in bed;with call bell/phone within reach   PT Visit Diagnosis: Difficulty in walking, not elsewhere classified (R26.2)    Time: 1610-9604 PT Time Calculation (min) (ACUTE ONLY): 19 min   Charges:   PT Evaluation $PT Eval Low Complexity: 1 Low   PT General Charges $$ ACUTE PT VISIT: 1 Visit            Tanda Falter, PT Acute Rehabilitation  Office: [phone]

## 2023-10-14 DIAGNOSIS — J9601 Acute respiratory failure with hypoxia: Secondary | ICD-10-CM | POA: Diagnosis not present

## 2023-10-14 NOTE — Progress Notes (Signed)
 Physical Therapy Treatment Patient Details Name: [name] MRN: [id number] DOB: 24-May-1956 Today's Date: 10/14/2023   History of Present Illness 68 yo female admitted with acute on chronic resp failure. Hx of COPD-wears O2 at baseline PRN, colitis, chronic pain, sciatica, L THA    PT Comments  Pt agreeable to working with therapy. Able to ambulate on RA-SpO2 91%, dyspnea 2/4. Pt tolerated well.     If plan is discharge home, recommend the following: A little help with walking and/or transfers;A little help with bathing/dressing/bathroom;Assistance with cooking/housework;Assist for transportation;Help with stairs or ramp for entrance   Can travel by private vehicle        Equipment Recommendations  None recommended by PT    Recommendations for Other Services       Precautions / Restrictions Precautions Precautions: Fall Precaution/Restrictions Comments: monitor O2 Restrictions Weight Bearing Restrictions Per Provider Order: No     Mobility  Bed Mobility Overal bed mobility: Modified Independent                  Transfers Overall transfer level: Needs assistance   Transfers: Sit to/from Stand Sit to Stand: Supervision           General transfer comment: Supv for safety.    Ambulation/Gait Ambulation/Gait assistance: Contact guard assist Gait Distance (Feet): 90 Feet Assistive device:  (hallway handrail vs none) Gait Pattern/deviations: Step-through pattern, Decreased stride length       General Gait Details: O2 91% on RA with hallway ambulation on today. Dyspnea 2/4. Slow guarded gait. Unsteady.   Stairs             Wheelchair Mobility     Tilt Bed    Modified Rankin (Stroke Patients Only)       Balance Overall balance assessment: Needs assistance         Standing balance support: During functional activity Standing balance-Leahy Scale: Fair                              Production designer, theatre/television/film: No apparent difficulties  Cognition Arousal: Alert Behavior During Therapy: WFL for tasks assessed/performed   PT - Cognitive impairments: No apparent impairments                         Following commands: Intact      Cueing Cueing Techniques: Verbal cues  Exercises      General Comments        Pertinent Vitals/Pain Pain Assessment Pain Assessment: No/denies pain    Home Living                          Prior Function            PT Goals (current goals can now be found in the care plan section) Progress towards PT goals: Progressing toward goals    Frequency    Min 3X/week      PT Plan      Co-evaluation              AM-PAC PT "6 Clicks" Mobility   Outcome Measure  Help needed turning from your back to your side while in a flat bed without using bedrails?: None Help needed moving from lying on your back to sitting on the side of a flat bed without using bedrails?: None Help needed moving to and from a bed  to a chair (including a wheelchair)?: None Help needed standing up from a chair using your arms (e.g., wheelchair or bedside chair)?: None Help needed to walk in hospital room?: A Little Help needed climbing 3-5 steps with a railing? : A Little 6 Click Score: 22    End of Session   Activity Tolerance: Patient tolerated treatment well Patient left: in bed;with call bell/phone within reach   PT Visit Diagnosis: Difficulty in walking, not elsewhere classified (R26.2)     Time: 2956-2130 PT Time Calculation (min) (ACUTE ONLY): 17 min  Charges:    $Gait Training: 8-22 mins PT General Charges $$ ACUTE PT VISIT: 1 Visit                         Tanda Falter, PT Acute Rehabilitation  Office: [phone]

## 2023-10-14 NOTE — Progress Notes (Signed)
 PROGRESS NOTE    CRYSTALEE VENTRESS  BJY:[id number] DOB: 1956-01-14 DOA: 10/11/2023 PCP: Terry Ficks, NP     Brief Narrative:  [name] is a 68 y.o. female with medical history significant of ulcerative colitis, asthma/COPD, chest pain, chronic back pain, sciatica, hypertension who is an active smoker who presented to the emergency department via EMS due to dyspnea associated with cough, wheezing, fatigue and worsening her chronic pleuritic chest pain.  Chest pain feels pressure-like.  It did not respond to her home medications.  The patient received 125 mg of Solu-Medrol , 2 DuoNebs and 2 g of magnesium  sulfate with EMS.  Patient was admitted for COPD exacerbation.  New events last 24 hours / Subjective: Still dyspneic with minimal activity.  Not at baseline.  Assessment & Plan:   Principal Problem:   Acute respiratory failure with hypoxia (HCC) Active Problems:   COPD with acute exacerbation (HCC)   Chronic respiratory failure with hypoxia and hypercapnia (HCC)   Hypokalemia   HTN (hypertension)   Musculoskeletal chest pain   Tobacco use   Centrilobular emphysema (HCC)   Precordial chest pain  Acute on chronic hypoxic respiratory failure secondary to COPD exacerbation - Uses 3 L nasal cannula O2 on as needed basis at baseline - Currently on nasal cannula O2 - Continue prednisone , azithromycin , breathing treatments - Not at baseline  Chronic pain - Continue pain medications as needed  Hyperlipidemia - Lipitor  Insomnia - Continue Ambien   OSA - CPAP nightly  Noncardiac chest pain - Troponin negative, echocardiogram normal  Tobacco use - Cessation counseling - Nicotine  patch   DVT prophylaxis:  enoxaparin  (LOVENOX ) injection 30 mg Start: 10/12/23 2200  Code Status: Full code Family Communication: None at bedside Disposition Plan: Home Status is: Observation The patient will require care spanning > 2 midnights and should be moved to inpatient  because: Respiratory failure    Antimicrobials:  Anti-infectives (From admission, onward)    Start     Dose/Rate Route Frequency Ordered Stop   10/12/23 1100  azithromycin  (ZITHROMAX ) tablet 500 mg        500 mg Oral Daily 10/12/23 1005 10/17/23 0959        Objective: Vitals:   10/13/23 1953 10/13/23 2024 10/14/23 0423 10/14/23 0841  BP: 115/76  126/72   Pulse: 83  65 66  Resp: 16  16 20   Temp: 99.1 F (37.3 C)  98.6 F (37 C)   TempSrc: Oral  Oral   SpO2: 100% 98% 99% 97%  Weight:      Height:        Intake/Output Summary (Last 24 hours) at 10/14/2023 1047 Last data filed at 10/13/2023 1330 Gross per 24 hour  Intake 240 ml  Output --  Net 240 ml   Filed Weights   10/11/23 0631  Weight: 44.9 kg    Examination:  General exam: Appears calm and comfortable  Respiratory system: Bilateral expiratory wheezes, mild conversational dyspnea today Cardiovascular system: S1 & S2 heard, RRR. No murmurs. No pedal edema. Gastrointestinal system: Abdomen is nondistended, soft and nontender. Normal bowel sounds heard. Central nervous system: Alert and oriented. No focal neurological deficits. Speech clear.  Extremities: Symmetric in appearance  Skin: No rashes, lesions or ulcers on exposed skin  Psychiatry: Judgement and insight appear normal. Mood & affect appropriate.   Data Reviewed: I have personally reviewed following labs and imaging studies  CBC: Recent Labs  Lab 10/11/23 0638 10/12/23 1116  WBC 6.2 9.4  NEUTROABS 2.0  --  HGB 11.9* 12.9  HCT 38.3 42.8  MCV 96.5 97.7  PLT 249 299   Basic Metabolic Panel: Recent Labs  Lab 10/11/23 0638 10/12/23 1116  NA 136 136  K 3.0* 3.6  CL 97* 95*  CO2 31 31  GLUCOSE 95 78  BUN 16 17  CREATININE 0.61 0.52  CALCIUM  9.0 9.9  MG  --  2.4   GFR: Estimated Creatinine Clearance: 46.4 mL/min (by C-G formula based on SCr of 0.52 mg/dL). Liver Function Tests: Recent Labs  Lab 10/11/23 0638  AST 17  ALT 9   ALKPHOS 41  BILITOT 0.6  PROT 6.5  ALBUMIN 4.0   No results for input(s): "LIPASE", "AMYLASE" in the last 168 hours. No results for input(s): "AMMONIA" in the last 168 hours. Coagulation Profile: No results for input(s): "INR", "PROTIME" in the last 168 hours. Cardiac Enzymes: No results for input(s): "CKTOTAL", "CKMB", "CKMBINDEX", "TROPONINI" in the last 168 hours. BNP (last 3 results) No results for input(s): "PROBNP" in the last 8760 hours. HbA1C: No results for input(s): "HGBA1C" in the last 72 hours. CBG: No results for input(s): "GLUCAP" in the last 168 hours. Lipid Profile: No results for input(s): "CHOL", "HDL", "LDLCALC", "TRIG", "CHOLHDL", "LDLDIRECT" in the last 72 hours. Thyroid  Function Tests: No results for input(s): "TSH", "T4TOTAL", "FREET4", "T3FREE", "THYROIDAB" in the last 72 hours. Anemia Panel: No results for input(s): "VITAMINB12", "FOLATE", "FERRITIN", "TIBC", "IRON", "RETICCTPCT" in the last 72 hours. Sepsis Labs: No results for input(s): "PROCALCITON", "LATICACIDVEN" in the last 168 hours.  Recent Results (from the past 240 hours)  Resp panel by RT-PCR (RSV, Flu A&B, Covid) Anterior Nasal Swab     Status: None   Collection Time: 10/11/23  6:37 AM   Specimen: Anterior Nasal Swab  Result Value Ref Range Status   SARS Coronavirus 2 by RT PCR NEGATIVE NEGATIVE Final    Comment: (NOTE) SARS-CoV-2 target nucleic acids are NOT DETECTED.  The SARS-CoV-2 RNA is generally detectable in upper respiratory specimens during the acute phase of infection. The lowest concentration of SARS-CoV-2 viral copies this assay can detect is 138 copies/mL. A negative result does not preclude SARS-Cov-2 infection and should not be used as the sole basis for treatment or other patient management decisions. A negative result may occur with  improper specimen collection/handling, submission of specimen other than nasopharyngeal swab, presence of viral mutation(s) within  the areas targeted by this assay, and inadequate number of viral copies(<138 copies/mL). A negative result must be combined with clinical observations, patient history, and epidemiological information. The expected result is Negative.  Fact Sheet for Patients:  BloggerCourse.com  Fact Sheet for Healthcare Providers:  SeriousBroker.it  This test is no t yet approved or cleared by the [place]  FDA and  has been authorized for detection and/or diagnosis of SARS-CoV-2 by FDA under an Emergency Use Authorization (EUA). This EUA will remain  in effect (meaning this test can be used) for the duration of the COVID-19 declaration under Section 564(b)(1) of the Act, 21 U.S.C.section 360bbb-3(b)(1), unless the authorization is terminated  or revoked sooner.       Influenza A by PCR NEGATIVE NEGATIVE Final   Influenza B by PCR NEGATIVE NEGATIVE Final    Comment: (NOTE) The Xpert Xpress SARS-CoV-2/FLU/RSV plus assay is intended as an aid in the diagnosis of influenza from Nasopharyngeal swab specimens and should not be used as a sole basis for treatment. Nasal washings and aspirates are unacceptable for Xpert Xpress SARS-CoV-2/FLU/RSV testing.  Fact Sheet  for Patients: BloggerCourse.com  Fact Sheet for Healthcare Providers: SeriousBroker.it  This test is not yet approved or cleared by the [place]  FDA and has been authorized for detection and/or diagnosis of SARS-CoV-2 by FDA under an Emergency Use Authorization (EUA). This EUA will remain in effect (meaning this test can be used) for the duration of the COVID-19 declaration under Section 564(b)(1) of the Act, 21 U.S.C. section 360bbb-3(b)(1), unless the authorization is terminated or revoked.     Resp Syncytial Virus by PCR NEGATIVE NEGATIVE Final    Comment: (NOTE) Fact Sheet for  Patients: BloggerCourse.com  Fact Sheet for Healthcare Providers: SeriousBroker.it  This test is not yet approved or cleared by the [place]  FDA and has been authorized for detection and/or diagnosis of SARS-CoV-2 by FDA under an Emergency Use Authorization (EUA). This EUA will remain in effect (meaning this test can be used) for the duration of the COVID-19 declaration under Section 564(b)(1) of the Act, 21 U.S.C. section 360bbb-3(b)(1), unless the authorization is terminated or revoked.  Performed at [facility], [address] [place], [place] [address]       Radiology Studies: No results found.     Scheduled Meds:  atorvastatin   10 mg Oral Daily   azithromycin   500 mg Oral Daily   enoxaparin  (LOVENOX ) injection  30 mg Subcutaneous Q24H   famotidine   20 mg Oral q morning   feeding supplement  237 mL Oral TID BM   ipratropium-albuterol   3 mL Nebulization QID   mirtazapine   7.5 mg Oral QHS   nicotine   14 mg Transdermal Daily   predniSONE   40 mg Oral Q breakfast   zolpidem   5 mg Oral QHS   Continuous Infusions:   LOS: 0 days   Time spent: 25 minutes   Daren Eck, DO [facility] 10/14/2023, 10:47 AM   Available via Epic secure chat 7am-7pm After these hours, please refer to coverage provider listed on [email or web address]

## 2023-10-14 NOTE — Plan of Care (Signed)
  Problem: Education: Goal: Knowledge of General Education information will improve Description: Including pain rating scale, medication(s)/side effects and non-pharmacologic comfort measures Outcome: Progressing   Problem: Health Behavior/Discharge Planning: Goal: Ability to manage health-related needs will improve Outcome: Progressing   Problem: Clinical Measurements: Goal: Diagnostic test results will improve Outcome: Progressing Goal: Respiratory complications will improve Outcome: Progressing   Problem: Nutrition: Goal: Adequate nutrition will be maintained Outcome: Progressing   Problem: Coping: Goal: Level of anxiety will decrease Outcome: Progressing

## 2023-10-15 DIAGNOSIS — J9601 Acute respiratory failure with hypoxia: Secondary | ICD-10-CM | POA: Diagnosis not present

## 2023-10-15 MED ORDER — HYDROCORTISONE 1 % EX CREA
1.0000 | TOPICAL_CREAM | Freq: Three times a day (TID) | CUTANEOUS | Status: DC | PRN
  Filled 2023-10-15: qty 28

## 2023-10-15 MED ORDER — IPRATROPIUM-ALBUTEROL 0.5-2.5 (3) MG/3ML IN SOLN
3.0000 mL | Freq: Three times a day (TID) | RESPIRATORY_TRACT | Status: DC
  Administered 2023-10-15 – 2023-10-16 (×4): 3 mL via RESPIRATORY_TRACT
  Filled 2023-10-15 (×4): qty 3

## 2023-10-15 MED ORDER — ZOLPIDEM TARTRATE 5 MG PO TABS
5.0000 mg | ORAL_TABLET | Freq: Once | ORAL | Status: AC | PRN
  Administered 2023-10-15: 5 mg via ORAL
  Filled 2023-10-15: qty 1

## 2023-10-15 MED ORDER — PREDNISONE 20 MG PO TABS
40.0000 mg | ORAL_TABLET | Freq: Every day | ORAL | Status: DC
  Administered 2023-10-16: 40 mg via ORAL
  Filled 2023-10-15: qty 2

## 2023-10-15 NOTE — Progress Notes (Signed)
 PROGRESS NOTE    [name]  UEA:[id number] DOB: February 03, 1956 DOA: 10/11/2023 PCP: Terry Ficks, NP     Brief Narrative:  [name] is a 68 y.o. female with medical history significant of ulcerative colitis, asthma/COPD, chest pain, chronic back pain, sciatica, hypertension who is an active smoker who presented to the emergency department via EMS due to dyspnea associated with cough, wheezing, fatigue and worsening her chronic pleuritic chest pain.  Chest pain feels pressure-like.  It did not respond to her home medications.  The patient received 125 mg of Solu-Medrol , 2 DuoNebs and 2 g of magnesium  sulfate with EMS.  Patient was admitted for COPD exacerbation.  New events last 24 hours / Subjective: Upset regarding nursing care overnight.  Still with bilateral wheezes.  Not at baseline  Assessment & Plan:   Principal Problem:   Acute respiratory failure with hypoxia (HCC) Active Problems:   COPD with acute exacerbation (HCC)   Chronic respiratory failure with hypoxia and hypercapnia (HCC)   Hypokalemia   HTN (hypertension)   Musculoskeletal chest pain   Tobacco use   Centrilobular emphysema (HCC)   Precordial chest pain  Acute on chronic hypoxic respiratory failure secondary to COPD exacerbation - Uses 3 L nasal cannula O2 on as needed basis at baseline - Currently on nasal cannula O2 - Continue prednisone , azithromycin , breathing treatments - Not at baseline  Chronic pain - Continue pain medications as needed  Hyperlipidemia - Lipitor  Insomnia - Continue Ambien   OSA - CPAP nightly  Noncardiac chest pain - Troponin negative, echocardiogram normal  Tobacco use - Cessation counseling - Nicotine  patch   DVT prophylaxis:  enoxaparin  (LOVENOX ) injection 30 mg Start: 10/12/23 2200  Code Status: Full code Family Communication: None at bedside Disposition Plan: Home Status is: Observation The patient will require care spanning > 2 midnights and  should be moved to inpatient because: Respiratory failure    Antimicrobials:  Anti-infectives (From admission, onward)    Start     Dose/Rate Route Frequency Ordered Stop   10/12/23 1100  azithromycin  (ZITHROMAX ) tablet 500 mg        500 mg Oral Daily 10/12/23 1005 10/17/23 0959        Objective: Vitals:   10/14/23 2003 10/14/23 2038 10/15/23 0436 10/15/23 1000  BP:  137/76 139/84   Pulse:  80 61   Resp:  16 16   Temp:  99.1 F (37.3 C) 98 F (36.7 C)   TempSrc:  Oral Oral   SpO2: 94% 99% 100% 97%  Weight:      Height:        Intake/Output Summary (Last 24 hours) at 10/15/2023 1112 Last data filed at 10/15/2023 0900 Gross per 24 hour  Intake 120 ml  Output --  Net 120 ml   Filed Weights   10/11/23 0631  Weight: 44.9 kg    Examination:  General exam: Appears calm and comfortable  Respiratory system: Bilateral expiratory wheezes, on room air Cardiovascular system: S1 & S2 heard, RRR. No murmurs. No pedal edema. Gastrointestinal system: Abdomen is nondistended, soft and nontender. Normal bowel sounds heard. Central nervous system: Alert and oriented. No focal neurological deficits. Speech clear.  Extremities: Symmetric in appearance  Skin: No rashes, lesions or ulcers on exposed skin  Psychiatry: Judgement and insight appear normal. Mood & affect appropriate.   Data Reviewed: I have personally reviewed following labs and imaging studies  CBC: Recent Labs  Lab 10/11/23 0638 10/12/23 1116  WBC 6.2  9.4  NEUTROABS 2.0  --   HGB 11.9* 12.9  HCT 38.3 42.8  MCV 96.5 97.7  PLT 249 299   Basic Metabolic Panel: Recent Labs  Lab 10/11/23 0638 10/12/23 1116  NA 136 136  K 3.0* 3.6  CL 97* 95*  CO2 31 31  GLUCOSE 95 78  BUN 16 17  CREATININE 0.61 0.52  CALCIUM  9.0 9.9  MG  --  2.4   GFR: Estimated Creatinine Clearance: 46.4 mL/min (by C-G formula based on SCr of 0.52 mg/dL). Liver Function Tests: Recent Labs  Lab 10/11/23 0638  AST 17  ALT 9   ALKPHOS 41  BILITOT 0.6  PROT 6.5  ALBUMIN 4.0   No results for input(s): "LIPASE", "AMYLASE" in the last 168 hours. No results for input(s): "AMMONIA" in the last 168 hours. Coagulation Profile: No results for input(s): "INR", "PROTIME" in the last 168 hours. Cardiac Enzymes: No results for input(s): "CKTOTAL", "CKMB", "CKMBINDEX", "TROPONINI" in the last 168 hours. BNP (last 3 results) No results for input(s): "PROBNP" in the last 8760 hours. HbA1C: No results for input(s): "HGBA1C" in the last 72 hours. CBG: No results for input(s): "GLUCAP" in the last 168 hours. Lipid Profile: No results for input(s): "CHOL", "HDL", "LDLCALC", "TRIG", "CHOLHDL", "LDLDIRECT" in the last 72 hours. Thyroid  Function Tests: No results for input(s): "TSH", "T4TOTAL", "FREET4", "T3FREE", "THYROIDAB" in the last 72 hours. Anemia Panel: No results for input(s): "VITAMINB12", "FOLATE", "FERRITIN", "TIBC", "IRON", "RETICCTPCT" in the last 72 hours. Sepsis Labs: No results for input(s): "PROCALCITON", "LATICACIDVEN" in the last 168 hours.  Recent Results (from the past 240 hours)  Resp panel by RT-PCR (RSV, Flu A&B, Covid) Anterior Nasal Swab     Status: None   Collection Time: 10/11/23  6:37 AM   Specimen: Anterior Nasal Swab  Result Value Ref Range Status   SARS Coronavirus 2 by RT PCR NEGATIVE NEGATIVE Final    Comment: (NOTE) SARS-CoV-2 target nucleic acids are NOT DETECTED.  The SARS-CoV-2 RNA is generally detectable in upper respiratory specimens during the acute phase of infection. The lowest concentration of SARS-CoV-2 viral copies this assay can detect is 138 copies/mL. A negative result does not preclude SARS-Cov-2 infection and should not be used as the sole basis for treatment or other patient management decisions. A negative result may occur with  improper specimen collection/handling, submission of specimen other than nasopharyngeal swab, presence of viral mutation(s) within  the areas targeted by this assay, and inadequate number of viral copies(<138 copies/mL). A negative result must be combined with clinical observations, patient history, and epidemiological information. The expected result is Negative.  Fact Sheet for Patients:  BloggerCourse.com  Fact Sheet for Healthcare Providers:  SeriousBroker.it  This test is no t yet approved or cleared by the [place]  FDA and  has been authorized for detection and/or diagnosis of SARS-CoV-2 by FDA under an Emergency Use Authorization (EUA). This EUA will remain  in effect (meaning this test can be used) for the duration of the COVID-19 declaration under Section 564(b)(1) of the Act, 21 U.S.C.section 360bbb-3(b)(1), unless the authorization is terminated  or revoked sooner.       Influenza A by PCR NEGATIVE NEGATIVE Final   Influenza B by PCR NEGATIVE NEGATIVE Final    Comment: (NOTE) The Xpert Xpress SARS-CoV-2/FLU/RSV plus assay is intended as an aid in the diagnosis of influenza from Nasopharyngeal swab specimens and should not be used as a sole basis for treatment. Nasal washings and aspirates are unacceptable  for Xpert Xpress SARS-CoV-2/FLU/RSV testing.  Fact Sheet for Patients: BloggerCourse.com  Fact Sheet for Healthcare Providers: SeriousBroker.it  This test is not yet approved or cleared by the [place]  FDA and has been authorized for detection and/or diagnosis of SARS-CoV-2 by FDA under an Emergency Use Authorization (EUA). This EUA will remain in effect (meaning this test can be used) for the duration of the COVID-19 declaration under Section 564(b)(1) of the Act, 21 U.S.C. section 360bbb-3(b)(1), unless the authorization is terminated or revoked.     Resp Syncytial Virus by PCR NEGATIVE NEGATIVE Final    Comment: (NOTE) Fact Sheet for  Patients: BloggerCourse.com  Fact Sheet for Healthcare Providers: SeriousBroker.it  This test is not yet approved or cleared by the [place]  FDA and has been authorized for detection and/or diagnosis of SARS-CoV-2 by FDA under an Emergency Use Authorization (EUA). This EUA will remain in effect (meaning this test can be used) for the duration of the COVID-19 declaration under Section 564(b)(1) of the Act, 21 U.S.C. section 360bbb-3(b)(1), unless the authorization is terminated or revoked.  Performed at [facility], [address] [place], [place] [address]       Radiology Studies: No results found.     Scheduled Meds:  atorvastatin   10 mg Oral Daily   azithromycin   500 mg Oral Daily   enoxaparin  (LOVENOX ) injection  30 mg Subcutaneous Q24H   famotidine   20 mg Oral q morning   feeding supplement  237 mL Oral TID BM   ipratropium-albuterol   3 mL Nebulization TID   mirtazapine   7.5 mg Oral QHS   nicotine   14 mg Transdermal Daily   zolpidem   5 mg Oral QHS   Continuous Infusions:   LOS: 0 days   Time spent: 25 minutes   Daren Eck, DO [facility] 10/15/2023, 11:12 AM   Available via Epic secure chat 7am-7pm After these hours, please refer to coverage provider listed on [email or web address]

## 2023-10-16 ENCOUNTER — Other Ambulatory Visit (HOSPITAL_COMMUNITY): Payer: Self-pay

## 2023-10-16 DIAGNOSIS — J441 Chronic obstructive pulmonary disease with (acute) exacerbation: Secondary | ICD-10-CM | POA: Diagnosis not present

## 2023-10-16 DIAGNOSIS — E876 Hypokalemia: Secondary | ICD-10-CM | POA: Diagnosis not present

## 2023-10-16 DIAGNOSIS — J9611 Chronic respiratory failure with hypoxia: Secondary | ICD-10-CM

## 2023-10-16 DIAGNOSIS — J432 Centrilobular emphysema: Secondary | ICD-10-CM

## 2023-10-16 DIAGNOSIS — I10 Essential (primary) hypertension: Secondary | ICD-10-CM

## 2023-10-16 DIAGNOSIS — J9601 Acute respiratory failure with hypoxia: Secondary | ICD-10-CM | POA: Diagnosis not present

## 2023-10-16 DIAGNOSIS — J9612 Chronic respiratory failure with hypercapnia: Secondary | ICD-10-CM

## 2023-10-16 DIAGNOSIS — R0789 Other chest pain: Secondary | ICD-10-CM

## 2023-10-16 MED ORDER — NICOTINE 14 MG/24HR TD PT24
14.0000 mg | MEDICATED_PATCH | Freq: Every day | TRANSDERMAL | 0 refills | Status: AC
  Filled 2023-10-16: qty 14, 14d supply, fill #0

## 2023-10-16 MED ORDER — PREDNISONE 10 MG PO TABS
ORAL_TABLET | ORAL | 0 refills | Status: DC
  Filled 2023-10-16: qty 10, 5d supply, fill #0
  Filled 2023-10-16: qty 10, 4d supply, fill #0

## 2023-10-16 MED ORDER — NICOTINE 14 MG/24HR TD PT24: 14.0000 mg | MEDICATED_PATCH | Freq: Every day | TRANSDERMAL | 0 refills | Status: DC

## 2023-10-16 MED ORDER — PREDNISONE 10 MG PO TABS: ORAL_TABLET | ORAL | 0 refills | Status: DC

## 2023-10-16 NOTE — TOC Transition Note (Signed)
 Transition of Care [facility]) - Discharge Note   Patient Details  Name: [name] MRN: [id number] Date of Birth: [birth year]  [facility] CM/SW Contact:  Gertha Ku, LCSW Phone Number: 10/16/2023, 2:59 PM   Clinical Narrative:    CSW met with the pt to discuss recommendations for home health services. Pt agreed and has no preference for an agency. [facility] has accepted the pt for HHPT/OT. Pt reports that her family will pick her up upon discharge. No further TOC needs; TOC sign-off.   Final next level of care: Home w Home Health Services Barriers to Discharge: No Barriers Identified   Patient Goals and CMS Choice Patient states their goals for this hospitalization and ongoing recovery are:: retrun home with home health CMS Medicare.gov Compare Post Acute Care list provided to:: Patient Choice offered to / list presented to : Patient      Discharge Placement                    Patient and family notified of of transfer: 10/16/23  Discharge Plan and Services Additional resources added to the After Visit Summary for                            [facility] Arranged: PT, OT HH Agency: [facility] Date [facility] Agency Contacted: 10/16/23 Time HH Agency Contacted: [phone] Representative spoke with at [facility] Agency: [email or web address]  Social Drivers of Health (SDOH) Interventions SDOH Screenings   Food Insecurity: No Food Insecurity (10/11/2023)  Housing: Low Risk  (10/11/2023)  Transportation Needs: No Transportation Needs (10/11/2023)  Utilities: Not At Risk (10/11/2023)  Depression (PHQ2-9): Low Risk  (10/05/2020)  Financial Resource Strain: Low Risk  (10/05/2020)  Social Connections: Moderately Integrated (10/11/2023)  Stress: No Stress Concern Present (10/05/2020)  Tobacco Use: High Risk (10/11/2023)     Readmission Risk Interventions    10/04/2022    9:50 AM  Readmission Risk Prevention Plan  Post Dischage Appt Complete  Medication Screening Complete   Transportation Screening Complete

## 2023-10-16 NOTE — Progress Notes (Incomplete)
 [facility]  PROGRESS NOTE  [name] UJW:[id number] DOB: 03-23-1956 DOA: 10/11/2023 PCP: Terry Ficks, NP   Brief HPI:    68 y.o. female with medical history significant of ulcerative colitis, asthma/COPD, chest pain, chronic back pain, sciatica, hypertension who is an active smoker who presented to the emergency department via EMS due to dyspnea associated with cough, wheezing, fatigue and worsening her chronic pleuritic chest pain.  Chest pain feels pressure-like.  It did not respond to her home medications.  The patient received 125 mg of Solu-Medrol , 2 DuoNebs and 2 g of magnesium  sulfate with EMS.  Patient was admitted for COPD exacerbation.    Assessment/Plan:   Acute on chronic hypoxic respiratory failure secondary to COPD exacerbation - Uses 3 L nasal cannula O2 on as needed basis at baseline - Currently on nasal cannula O2 - Continue prednisone , azithromycin , breathing treatments - Not at baseline   Chronic pain - Continue pain medications as needed   Hyperlipidemia - Lipitor   Insomnia - Continue Ambien    OSA - CPAP nightly   Noncardiac chest pain - Troponin negative, echocardiogram normal   Tobacco use - Cessation counseling - Nicotine  patch      Medications     atorvastatin   10 mg Oral Daily   azithromycin   500 mg Oral Daily   enoxaparin  (LOVENOX ) injection  30 mg Subcutaneous Q24H   famotidine   20 mg Oral q morning   feeding supplement  237 mL Oral TID BM   ipratropium-albuterol   3 mL Nebulization TID   mirtazapine   7.5 mg Oral QHS   nicotine   14 mg Transdermal Daily   predniSONE   40 mg Oral Q breakfast     Data Reviewed:   CBG:  No results for input(s): "GLUCAP" in the last 168 hours.  SpO2: 96 % O2 Flow Rate (L/min): 3 L/min FiO2 (%): 32 %    Vitals:   10/15/23 1147 10/15/23 2045 10/15/23 2110 10/16/23 0517  BP: (!) 110/90 116/86  (!) 140/98  Pulse: 73 67  98  Resp: 18 18  18   Temp: 98.9 F (37.2 C) 98.5 F (36.9  C)  98.6 F (37 C)  TempSrc: Oral Oral  Oral  SpO2: 97% 100% 100% 96%  Weight:      Height:          Data Reviewed:  Basic Metabolic Panel: Recent Labs  Lab 10/11/23 0638 10/12/23 1116  NA 136 136  K 3.0* 3.6  CL 97* 95*  CO2 31 31  GLUCOSE 95 78  BUN 16 17  CREATININE 0.61 0.52  CALCIUM  9.0 9.9  MG  --  2.4    CBC: Recent Labs  Lab 10/11/23 0638 10/12/23 1116  WBC 6.2 9.4  NEUTROABS 2.0  --   HGB 11.9* 12.9  HCT 38.3 42.8  MCV 96.5 97.7  PLT 249 299    LFT Recent Labs  Lab 10/11/23 0638  AST 17  ALT 9  ALKPHOS 41  BILITOT 0.6  PROT 6.5  ALBUMIN 4.0     Antibiotics: Anti-infectives (From admission, onward)    Start     Dose/Rate Route Frequency Ordered Stop   10/12/23 1100  azithromycin  (ZITHROMAX ) tablet 500 mg        500 mg Oral Daily 10/12/23 1005 10/17/23 0959        DVT prophylaxis: ***  Code Status: ***  Family Communication: ***   CONSULTS ***   Subjective   ***   Objective  Physical Examination:   General:  *** Cardiovascular: *** Respiratory: *** Abdomen: *** Extremities: ***  Neurologic:  ***   Status is: Inpatient:  ***           Blakleigh Straw S Hadden Steig   [facility] If 7PM-7AM, please contact night-coverage at [email or web address], Office  [phone]   10/16/2023, 7:55 AM  LOS: 0 days

## 2023-10-16 NOTE — Discharge Summary (Addendum)
 Physician Discharge Summary   Patient: [name] MRN: [id number] DOB: 1955/10/13  Admit date:     10/11/2023  Discharge date: 10/16/23  Discharge Physician: [name]   PCP: Regina Ficks, NP   Recommendations at discharge:   Follow-up PCP in 1 week  Discharge Diagnoses: Principal Problem:   Acute respiratory failure with hypoxia (HCC) Active Problems:   COPD with acute exacerbation (HCC)   Chronic respiratory failure with hypoxia and hypercapnia (HCC)   Hypokalemia   HTN (hypertension)   Musculoskeletal chest pain   Tobacco use   Centrilobular emphysema (HCC)   Precordial chest pain  Resolved Problems:   * No resolved hospital problems. *  Hospital Course: 68 y.o. female with medical history significant of ulcerative colitis, asthma/COPD, chest pain, chronic back pain, sciatica, hypertension who is an active smoker who presented to the emergency department via EMS due to dyspnea associated with cough, wheezing, fatigue and worsening her chronic pleuritic chest pain.  Chest pain feels pressure-like.  It did not respond to her home medications.  The patient received 125 mg of Solu-Medrol , 2 DuoNebs and 2 g of magnesium  sulfate with EMS.  Patient was admitted for COPD exacerbation.      Assessment and Plan:  Acute on chronic hypoxic respiratory failure secondary to COPD exacerbation -Significantly improved - Uses 3 L nasal cannula O2 on as needed basis at baseline - Currently on nasal cannula O2 -Completed 5 days of Zithromax  in the hospital. -Will discharge on prednisone  taper for 5 more days    Chronic pain - Continue pain medications as needed   Hyperlipidemia - Lipitor   Insomnia - Continue Ambien    OSA - CPAP nightly   Noncardiac chest pain - Troponin negative, echocardiogram normal   Tobacco use - Cessation counseling - Nicotine  patch        Consultants:  Procedures performed: Disposition: Home Diet recommendation:  Discharge  Diet Orders (From admission, onward)     Start     Ordered   10/16/23 0000  Diet - low sodium heart healthy        10/16/23 1421           Regular diet DISCHARGE MEDICATION: Allergies as of 10/16/2023   No Known Allergies      Medication List     STOP taking these medications    nicotine  7 mg/24hr patch Commonly known as: Nicoderm CQ  Replaced by: nicotine  14 mg/24hr patch       TAKE these medications    albuterol  (2.5 MG/3ML) 0.083% nebulizer solution Commonly known as: PROVENTIL  Take 2.5 mg by nebulization every 6 (six) hours as needed for wheezing or shortness of breath.   albuterol  108 (90 Base) MCG/ACT inhaler Commonly known as: VENTOLIN  HFA Inhale 2 puffs into the lungs every 4 (four) hours as needed for wheezing or shortness of breath.   dextromethorphan -guaiFENesin  30-600 MG 12hr tablet Commonly known as: MUCINEX  DM Take 1 tablet by mouth 2 (two) times daily.   enalapril -hydrochlorothiazide  10-25 MG tablet Commonly known as: VASERETIC  Take 1 tablet by mouth daily.   feeding supplement (OSMOLITE 1.2 CAL) Liqd Take 237 mLs by mouth 3 (three) times daily between meals.   fluticasone -salmeterol 250-50 MCG/ACT Aepb Commonly known as: Advair  Diskus Inhale 1 puff into the lungs in the morning and at bedtime. What changed: when to take this   hydrOXYzine  25 MG tablet Commonly known as: ATARAX  Take 25 mg by mouth 3 (three) times daily as needed for anxiety.  ipratropium-albuterol  0.5-2.5 (3) MG/3ML Soln Commonly known as: DUONEB Take 3 mLs by nebulization 3 (three) times daily. What changed:  when to take this reasons to take this   mirtazapine  7.5 MG tablet Commonly known as: REMERON  Take 7.5 mg by mouth at bedtime.   multivitamin capsule Take 1 capsule by mouth 3 (three) times a week.   nicotine  14 mg/24hr patch Commonly known as: NICODERM CQ  - dosed in mg/24 hours Place 1 patch (14 mg total) onto the skin daily. Start taking on: Oct 17, 2023 Replaces: nicotine  7 mg/24hr patch   oxyCODONE -acetaminophen  10-325 MG tablet Commonly known as: PERCOCET Take 1 tablet by mouth See admin instructions. Take 1 tablet by mouth every 4-6 hours   OXYGEN Inhale 3 L/min into the lungs as needed (for shortness of breath).   polyethylene glycol 17 g packet Commonly known as: MIRALAX  / GLYCOLAX  Take 17 g by mouth daily as needed for mild constipation.   predniSONE  5 MG tablet Commonly known as: DELTASONE  Take 5 mg by mouth daily as needed (for respiratory flares- take with food). What changed: Another medication with the same name was changed. Make sure you understand how and when to take each.   predniSONE  10 MG tablet Commonly known as: DELTASONE  Prednisone  40 mg po daily x 1 day then Prednisone  30 mg po daily x 1 day then Prednisone  20 mg po daily x 1 day then Prednisone  10 mg daily x 1 day then stop... Start taking on: Oct 17, 2023 What changed:  medication strength how much to take how to take this when to take this additional instructions   PRESCRIPTION MEDICATION See admin instructions. BiPAP- At bedtime   zolpidem  5 MG tablet Commonly known as: AMBIEN  Take 5 mg by mouth at bedtime.        Follow-up Information     Regina Ficks, NP Follow up in 1 week(s).   Specialty: Nurse Practitioner Contact information: [address] [place] [address] [phone]                Discharge Exam: [name] Weights   10/11/23 0631 10/16/23 0800  Weight: 44.9 kg 45 kg   General-appears in no acute distress Heart-S1-S2, regular, no murmur auscultated Lungs-clear to auscultation bilaterally, no wheezing or crackles auscultated Abdomen-soft, nontender, no organomegaly Extremities-no edema in the lower extremities Neuro-alert, oriented x3, no focal deficit noted  Condition at discharge: good  The results of significant diagnostics from this hospitalization (including imaging, microbiology, ancillary and  laboratory) are listed below for reference.   Imaging Studies: ECHOCARDIOGRAM COMPLETE Result Date: 10/11/2023    ECHOCARDIOGRAM REPORT   Patient Name:   [name] Date of Exam: 10/11/2023 Medical Rec #:  [id number]      Height:       61.0 in Accession #:    [id number]     Weight:       99.0 lb Date of Birth:  [birth year]      BSA:          1.401 m Patient Age:    68 years       BP:           117/77 mmHg Patient Gender: F              HR:           75 bpm. Exam Location:  Inpatient Procedure: 2D Echo, Cardiac Doppler and Color Doppler (Both Spectral and Color  Flow Doppler were utilized during procedure). Indications:    Chest pain, abnormal ECG  History:        Patient has prior history of Echocardiogram examinations, most                 recent 01/24/2023. COPD, Signs/Symptoms:Chest Pain; Risk                 Factors:Current Smoker and Hypertension.  Sonographer:    Juanita Shaw Referring Phys: [id number] DAVID MANUEL ORTIZ  Sonographer Comments: Image acquisition challenging due to uncooperative patient. IMPRESSIONS  1. Left ventricular ejection fraction, by estimation, is 60 to 65%. The left ventricle has normal function. The left ventricle has no regional wall motion abnormalities. Left ventricular diastolic function could not be evaluated.  2. Peak RV-RA gradient 26 mmHg. Right ventricular systolic function is normal. The right ventricular size is normal.  3. The mitral valve is normal in structure. No evidence of mitral valve regurgitation. No evidence of mitral stenosis.  4. The aortic valve is tricuspid. Aortic valve regurgitation is not visualized. No aortic stenosis is present.  5. IVC not visualized.  6. Echo stopped by patient after only 1 apical image. Study incomplete. FINDINGS  Left Ventricle: Left ventricular ejection fraction, by estimation, is 60 to 65%. The left ventricle has normal function. The left ventricle has no regional wall motion abnormalities. The left ventricular internal  cavity size was normal in size. There is  no left ventricular hypertrophy. Left ventricular diastolic function could not be evaluated. Right Ventricle: Peak RV-RA gradient 26 mmHg. The right ventricular size is normal. No increase in right ventricular wall thickness. Right ventricular systolic function is normal. Pericardium: There is no evidence of pericardial effusion. Mitral Valve: The mitral valve is normal in structure. No evidence of mitral valve regurgitation. No evidence of mitral valve stenosis. Tricuspid Valve: The tricuspid valve is normal in structure. Tricuspid valve regurgitation is mild. Aortic Valve: The aortic valve is tricuspid. Aortic valve regurgitation is not visualized. No aortic stenosis is present. Pulmonic Valve: The pulmonic valve was normal in structure. Pulmonic valve regurgitation is trivial. Aorta: The aortic root is normal in size and structure. Venous: IVC not visualized.  LEFT VENTRICLE PLAX 2D LVIDd:         4.30 cm LVIDs:         1.90 cm LV PW:         0.70 cm LV IVS:        0.80 cm LVOT diam:     1.80 cm LVOT Area:     2.54 cm  LV Volumes (MOD) LV vol d, MOD A4C: 67.8 ml LV vol s, MOD A4C: 20.2 ml LV SV MOD A4C:     67.8 ml LEFT ATRIUM         Index LA diam:    1.70 cm 1.21 cm/m                        PULMONIC VALVE AORTA                 PV Vmax:       1.14 m/s Ao Root diam: 3.30 cm PV Peak grad:  5.2 mmHg Ao Asc diam:  2.90 cm  TRICUSPID VALVE TR Peak grad:   26.8 mmHg TR Vmax:        259.00 cm/s  SHUNTS Systemic Diam: 1.80 cm Dalton McleanMD Electronically signed by Archer Bear Signature Date/Time: 10/11/2023/3:47:26 PM  Final    DG Chest Port 1 View Result Date: 10/11/2023 CLINICAL DATA:  Shortness of breath. EXAM: PORTABLE CHEST 1 VIEW COMPARISON:  09/04/2023 FINDINGS: Lungs are hyperexpanded. The lungs are clear without focal pneumonia, edema, pneumothorax or pleural effusion. The cardiopericardial silhouette is within normal limits for size. No acute bony  abnormality. Telemetry leads overlie the chest. IMPRESSION: Hyperexpansion without acute cardiopulmonary findings. Electronically Signed   By: Donnal Fusi M.D.   On: 10/11/2023 07:12    Microbiology: Results for orders placed or performed during the hospital encounter of 10/11/23  Resp panel by RT-PCR (RSV, Flu A&B, Covid) Anterior Nasal Swab     Status: None   Collection Time: 10/11/23  6:37 AM   Specimen: Anterior Nasal Swab  Result Value Ref Range Status   SARS Coronavirus 2 by RT PCR NEGATIVE NEGATIVE Final    Comment: (NOTE) SARS-CoV-2 target nucleic acids are NOT DETECTED.  The SARS-CoV-2 RNA is generally detectable in upper respiratory specimens during the acute phase of infection. The lowest concentration of SARS-CoV-2 viral copies this assay can detect is 138 copies/mL. A negative result does not preclude SARS-Cov-2 infection and should not be used as the sole basis for treatment or other patient management decisions. A negative result may occur with  improper specimen collection/handling, submission of specimen other than nasopharyngeal swab, presence of viral mutation(s) within the areas targeted by this assay, and inadequate number of viral copies(<138 copies/mL). A negative result must be combined with clinical observations, patient history, and epidemiological information. The expected result is Negative.  Fact Sheet for Patients:  BloggerCourse.com  Fact Sheet for Healthcare Providers:  SeriousBroker.it  This test is no t yet approved or cleared by the [place]  FDA and  has been authorized for detection and/or diagnosis of SARS-CoV-2 by FDA under an Emergency Use Authorization (EUA). This EUA will remain  in effect (meaning this test can be used) for the duration of the COVID-19 declaration under Section 564(b)(1) of the Act, 21 U.S.C.section 360bbb-3(b)(1), unless the authorization is terminated  or revoked  sooner.       Influenza A by PCR NEGATIVE NEGATIVE Final   Influenza B by PCR NEGATIVE NEGATIVE Final    Comment: (NOTE) The Xpert Xpress SARS-CoV-2/FLU/RSV plus assay is intended as an aid in the diagnosis of influenza from Nasopharyngeal swab specimens and should not be used as a sole basis for treatment. Nasal washings and aspirates are unacceptable for Xpert Xpress SARS-CoV-2/FLU/RSV testing.  Fact Sheet for Patients: BloggerCourse.com  Fact Sheet for Healthcare Providers: SeriousBroker.it  This test is not yet approved or cleared by the [place]  FDA and has been authorized for detection and/or diagnosis of SARS-CoV-2 by FDA under an Emergency Use Authorization (EUA). This EUA will remain in effect (meaning this test can be used) for the duration of the COVID-19 declaration under Section 564(b)(1) of the Act, 21 U.S.C. section 360bbb-3(b)(1), unless the authorization is terminated or revoked.     Resp Syncytial Virus by PCR NEGATIVE NEGATIVE Final    Comment: (NOTE) Fact Sheet for Patients: BloggerCourse.com  Fact Sheet for Healthcare Providers: SeriousBroker.it  This test is not yet approved or cleared by the [place]  FDA and has been authorized for detection and/or diagnosis of SARS-CoV-2 by FDA under an Emergency Use Authorization (EUA). This EUA will remain in effect (meaning this test can be used) for the duration of the COVID-19 declaration under Section 564(b)(1) of the Act, 21 U.S.C. section 360bbb-3(b)(1), unless the authorization is terminated  or revoked.  Performed at [facility], [address] [place], [place] [address]     Labs: CBC: Recent Labs  Lab 10/11/23 [phone] 10/12/23 1116  WBC 6.2 9.4  NEUTROABS 2.0  --   HGB 11.9* 12.9  HCT 38.3 42.8  MCV 96.5 97.7  PLT 249 299   Basic Metabolic Panel: Recent Labs  Lab  10/11/23 0638 10/12/23 1116  NA 136 136  K 3.0* 3.6  CL 97* 95*  CO2 31 31  GLUCOSE 95 78  BUN 16 17  CREATININE 0.61 0.52  CALCIUM  9.0 9.9  MG  --  2.4   Liver Function Tests: Recent Labs  Lab 10/11/23 0638  AST 17  ALT 9  ALKPHOS 41  BILITOT 0.6  PROT 6.5  ALBUMIN 4.0   CBG: No results for input(s): "GLUCAP" in the last 168 hours.  Discharge time spent: greater than 30 minutes.  Signed: Ozell Blunt, MD [facility] 10/16/2023

## 2023-10-16 NOTE — Progress Notes (Signed)
 Discharge instructions given to patient questions asked and answered, discharge medications delivered to patient at bedside [facility]

## 2023-12-02 ENCOUNTER — Inpatient Hospital Stay (HOSPITAL_COMMUNITY)
Admission: EM | Admit: 2023-12-02 | Discharge: 2023-12-06 | DRG: 189 | Disposition: A | Discharge status: Home Health | Attending: Internal Medicine | Admitting: Internal Medicine

## 2023-12-02 ENCOUNTER — Other Ambulatory Visit: Payer: Self-pay

## 2023-12-02 ENCOUNTER — Encounter (HOSPITAL_COMMUNITY): Payer: Self-pay | Admitting: *Deleted

## 2023-12-02 ENCOUNTER — Emergency Department (HOSPITAL_COMMUNITY)

## 2023-12-02 DIAGNOSIS — Z801 Family history of malignant neoplasm of trachea, bronchus and lung: Secondary | ICD-10-CM

## 2023-12-02 DIAGNOSIS — G894 Chronic pain syndrome: Secondary | ICD-10-CM | POA: Diagnosis present

## 2023-12-02 DIAGNOSIS — R0689 Other abnormalities of breathing: Secondary | ICD-10-CM | POA: Diagnosis not present

## 2023-12-02 DIAGNOSIS — Z833 Family history of diabetes mellitus: Secondary | ICD-10-CM

## 2023-12-02 DIAGNOSIS — R079 Chest pain, unspecified: Secondary | ICD-10-CM

## 2023-12-02 DIAGNOSIS — J441 Chronic obstructive pulmonary disease with (acute) exacerbation: Principal | ICD-10-CM | POA: Diagnosis present

## 2023-12-02 DIAGNOSIS — E871 Hypo-osmolality and hyponatremia: Secondary | ICD-10-CM | POA: Diagnosis present

## 2023-12-02 DIAGNOSIS — F17210 Nicotine dependence, cigarettes, uncomplicated: Secondary | ICD-10-CM | POA: Diagnosis not present

## 2023-12-02 DIAGNOSIS — Z79899 Other long term (current) drug therapy: Secondary | ICD-10-CM

## 2023-12-02 DIAGNOSIS — I493 Ventricular premature depolarization: Secondary | ICD-10-CM | POA: Diagnosis present

## 2023-12-02 DIAGNOSIS — G4700 Insomnia, unspecified: Secondary | ICD-10-CM | POA: Diagnosis present

## 2023-12-02 DIAGNOSIS — M7918 Myalgia, other site: Secondary | ICD-10-CM | POA: Diagnosis present

## 2023-12-02 DIAGNOSIS — R0789 Other chest pain: Secondary | ICD-10-CM | POA: Diagnosis present

## 2023-12-02 DIAGNOSIS — J9622 Acute and chronic respiratory failure with hypercapnia: Secondary | ICD-10-CM | POA: Diagnosis present

## 2023-12-02 DIAGNOSIS — Z716 Tobacco abuse counseling: Secondary | ICD-10-CM

## 2023-12-02 DIAGNOSIS — Z1152 Encounter for screening for COVID-19: Secondary | ICD-10-CM

## 2023-12-02 DIAGNOSIS — E876 Hypokalemia: Secondary | ICD-10-CM | POA: Diagnosis present

## 2023-12-02 DIAGNOSIS — E785 Hyperlipidemia, unspecified: Secondary | ICD-10-CM | POA: Diagnosis present

## 2023-12-02 DIAGNOSIS — J9621 Acute and chronic respiratory failure with hypoxia: Principal | ICD-10-CM | POA: Diagnosis present

## 2023-12-02 DIAGNOSIS — Z90710 Acquired absence of both cervix and uterus: Secondary | ICD-10-CM

## 2023-12-02 DIAGNOSIS — Z9981 Dependence on supplemental oxygen: Secondary | ICD-10-CM

## 2023-12-02 DIAGNOSIS — J9611 Chronic respiratory failure with hypoxia: Secondary | ICD-10-CM | POA: Diagnosis not present

## 2023-12-02 DIAGNOSIS — Z8249 Family history of ischemic heart disease and other diseases of the circulatory system: Secondary | ICD-10-CM

## 2023-12-02 DIAGNOSIS — J432 Centrilobular emphysema: Secondary | ICD-10-CM

## 2023-12-02 DIAGNOSIS — Z808 Family history of malignant neoplasm of other organs or systems: Secondary | ICD-10-CM

## 2023-12-02 DIAGNOSIS — M5430 Sciatica, unspecified side: Secondary | ICD-10-CM | POA: Diagnosis present

## 2023-12-02 DIAGNOSIS — Z7951 Long term (current) use of inhaled steroids: Secondary | ICD-10-CM

## 2023-12-02 DIAGNOSIS — Z96642 Presence of left artificial hip joint: Secondary | ICD-10-CM | POA: Diagnosis present

## 2023-12-02 DIAGNOSIS — R413 Other amnesia: Secondary | ICD-10-CM

## 2023-12-02 DIAGNOSIS — F411 Generalized anxiety disorder: Secondary | ICD-10-CM | POA: Diagnosis present

## 2023-12-02 DIAGNOSIS — I10 Essential (primary) hypertension: Secondary | ICD-10-CM | POA: Diagnosis present

## 2023-12-02 LAB — BLOOD GAS, VENOUS
Acid-Base Excess: 7.4 mmol/L — ABNORMAL HIGH (ref 0.0–2.0)
Bicarbonate: 35.9 mmol/L — ABNORMAL HIGH (ref 20.0–28.0)
O2 Saturation: 79.8 %
Patient temperature: 37
pCO2, Ven: 68 mmHg — ABNORMAL HIGH (ref 44–60)
pH, Ven: 7.33 (ref 7.25–7.43)
pO2, Ven: 47 mmHg — ABNORMAL HIGH (ref 32–45)

## 2023-12-02 LAB — CBC
HCT: 36 % (ref 36.0–46.0)
Hemoglobin: 11 g/dL — ABNORMAL LOW (ref 12.0–15.0)
MCH: 29.2 pg (ref 26.0–34.0)
MCHC: 30.6 g/dL (ref 30.0–36.0)
MCV: 95.5 fL (ref 80.0–100.0)
Platelets: 283 K/uL (ref 150–400)
RBC: 3.77 MIL/uL — ABNORMAL LOW (ref 3.87–5.11)
RDW: 14.4 % (ref 11.5–15.5)
WBC: 5.8 K/uL (ref 4.0–10.5)
nRBC: 0 % (ref 0.0–0.2)

## 2023-12-02 LAB — BRAIN NATRIURETIC PEPTIDE: B Natriuretic Peptide: 32.9 pg/mL (ref 0.0–100.0)

## 2023-12-02 LAB — RESP PANEL BY RT-PCR (RSV, FLU A&B, COVID)  RVPGX2
Influenza A by PCR: NEGATIVE
Influenza B by PCR: NEGATIVE
Resp Syncytial Virus by PCR: NEGATIVE
SARS Coronavirus 2 by RT PCR: NEGATIVE

## 2023-12-02 LAB — BASIC METABOLIC PANEL WITH GFR
Anion gap: 10 (ref 5–15)
BUN: 9 mg/dL (ref 8–23)
CO2: 28 mmol/L (ref 22–32)
Calcium: 8.3 mg/dL — ABNORMAL LOW (ref 8.9–10.3)
Chloride: 95 mmol/L — ABNORMAL LOW (ref 98–111)
Creatinine, Ser: 0.65 mg/dL (ref 0.44–1.00)
GFR, Estimated: >60 mL/min (ref >60)
Glucose, Bld: 107 mg/dL — ABNORMAL HIGH (ref 70–99)
Potassium: 3.5 mmol/L (ref 3.5–5.1)
Sodium: 133 mmol/L — ABNORMAL LOW (ref 135–145)

## 2023-12-02 LAB — TROPONIN I (HIGH SENSITIVITY): Troponin I (High Sensitivity): 5 ng/L (ref <18)

## 2023-12-02 MED ORDER — ACETAMINOPHEN 650 MG RE SUPP
650.0000 mg | Freq: Four times a day (QID) | RECTAL | Status: DC | PRN
Start: 2023-12-02 — End: 2023-12-06

## 2023-12-02 MED ORDER — METHYLPREDNISOLONE SODIUM SUCC 40 MG IJ SOLR: 40.0000 mg | Freq: Two times a day (BID) | INTRAMUSCULAR | Status: DC

## 2023-12-02 MED ORDER — OXYCODONE-ACETAMINOPHEN 10-325 MG PO TABS: 1.0000 | ORAL_TABLET | Freq: Three times a day (TID) | ORAL | Status: DC | PRN

## 2023-12-02 MED ORDER — ALBUTEROL SULFATE (2.5 MG/3ML) 0.083% IN NEBU
2.5000 mg | INHALATION_SOLUTION | RESPIRATORY_TRACT | Status: DC | PRN
  Administered 2023-12-03 – 2023-12-05 (×3): 2.5 mg via RESPIRATORY_TRACT
  Filled 2023-12-02 (×5): qty 3

## 2023-12-02 MED ORDER — LACTATED RINGERS IV SOLN: INTRAVENOUS | Status: DC

## 2023-12-02 MED ORDER — LACTATED RINGERS IV BOLUS
1000.0000 mL | Freq: Once | INTRAVENOUS | Status: AC
  Administered 2023-12-03: 1000 mL via INTRAVENOUS

## 2023-12-02 MED ORDER — ONDANSETRON HCL 4 MG PO TABS: 4.0000 mg | ORAL_TABLET | Freq: Four times a day (QID) | ORAL | Status: DC | PRN

## 2023-12-02 MED ORDER — SODIUM CHLORIDE 0.9% FLUSH: 3.0000 mL | INTRAVENOUS | Status: DC | PRN

## 2023-12-02 MED ORDER — ENOXAPARIN SODIUM 40 MG/0.4ML IJ SOSY
40.0000 mg | PREFILLED_SYRINGE | INTRAMUSCULAR | Status: DC
  Administered 2023-12-03 – 2023-12-06 (×4): 40 mg via SUBCUTANEOUS
  Filled 2023-12-02 (×4): qty 0.4

## 2023-12-02 MED ORDER — SODIUM CHLORIDE 0.9% FLUSH
3.0000 mL | Freq: Two times a day (BID) | INTRAVENOUS | Status: DC
  Administered 2023-12-04 – 2023-12-05 (×4): 3 mL via INTRAVENOUS

## 2023-12-02 MED ORDER — ONDANSETRON HCL 4 MG/2ML IJ SOLN: 4.0000 mg | Freq: Four times a day (QID) | INTRAMUSCULAR | Status: DC | PRN

## 2023-12-02 MED ORDER — HYDROXYZINE HCL 25 MG PO TABS
25.0000 mg | ORAL_TABLET | Freq: Three times a day (TID) | ORAL | Status: DC | PRN
  Administered 2023-12-04: 25 mg via ORAL
  Filled 2023-12-02: qty 1

## 2023-12-02 MED ORDER — NICOTINE 14 MG/24HR TD PT24
14.0000 mg | MEDICATED_PATCH | Freq: Every day | TRANSDERMAL | Status: DC
  Administered 2023-12-03 – 2023-12-06 (×4): 14 mg via TRANSDERMAL
  Filled 2023-12-02 (×4): qty 1

## 2023-12-02 MED ORDER — IPRATROPIUM-ALBUTEROL 0.5-2.5 (3) MG/3ML IN SOLN
3.0000 mL | Freq: Four times a day (QID) | RESPIRATORY_TRACT | Status: DC
  Administered 2023-12-03 (×2): 3 mL via RESPIRATORY_TRACT
  Filled 2023-12-02 (×2): qty 3

## 2023-12-02 MED ORDER — BENZONATATE 100 MG PO CAPS
100.0000 mg | ORAL_CAPSULE | Freq: Three times a day (TID) | ORAL | Status: DC | PRN
  Administered 2023-12-04 – 2023-12-05 (×3): 100 mg via ORAL
  Filled 2023-12-02 (×3): qty 1

## 2023-12-02 MED ORDER — OXYCODONE HCL 5 MG PO TABS
5.0000 mg | ORAL_TABLET | Freq: Once | ORAL | Status: AC
  Administered 2023-12-02: 5 mg via ORAL
  Filled 2023-12-02: qty 1

## 2023-12-02 MED ORDER — ACETAMINOPHEN 325 MG PO TABS: 650.0000 mg | ORAL_TABLET | Freq: Four times a day (QID) | ORAL | Status: DC | PRN

## 2023-12-02 MED ORDER — OXYCODONE HCL 5 MG PO TABS: 10.0000 mg | ORAL_TABLET | Freq: Four times a day (QID) | ORAL | Status: DC | PRN

## 2023-12-02 MED ORDER — MIRTAZAPINE 15 MG PO TABS
7.5000 mg | ORAL_TABLET | Freq: Every day | ORAL | Status: DC
  Administered 2023-12-03 – 2023-12-05 (×4): 7.5 mg via ORAL
  Filled 2023-12-02 (×4): qty 1

## 2023-12-02 MED ORDER — SODIUM CHLORIDE 0.9 % IV SOLN
250.0000 mL | INTRAVENOUS | Status: AC | PRN
Start: 2023-12-02 — End: 2023-12-03

## 2023-12-02 MED ORDER — IPRATROPIUM-ALBUTEROL 0.5-2.5 (3) MG/3ML IN SOLN
3.0000 mL | Freq: Four times a day (QID) | RESPIRATORY_TRACT | Status: DC
  Administered 2023-12-02 – 2023-12-03 (×3): 3 mL via RESPIRATORY_TRACT
  Filled 2023-12-02 (×3): qty 3

## 2023-12-02 MED ORDER — ALBUTEROL SULFATE (2.5 MG/3ML) 0.083% IN NEBU: 2.5000 mg | INHALATION_SOLUTION | RESPIRATORY_TRACT | Status: DC | PRN

## 2023-12-02 MED ORDER — SODIUM CHLORIDE 0.9 % IV SOLN
500.0000 mg | INTRAVENOUS | Status: DC
  Administered 2023-12-03: 500 mg via INTRAVENOUS
  Filled 2023-12-02: qty 5

## 2023-12-02 NOTE — ED Triage Notes (Signed)
 Pt from home with EMS for sob and not feeling well for a few days. BLE edema noted. Initially EMS reported resp distress with room air sats at 80%. Given 10mg  albuterol  and .5mg  atrovent, 125mg  solumedrol, and 2g Mag enroute. Pt reports chronic home oxygen use at 4L via [place]  EMS VS 152/96, pulse 66, pulse ox at 100% with nebs

## 2023-12-02 NOTE — H&P (Signed)
 History and Physical    [name] FMW:[id number] DOB: 04/23/1956 DOA: 12/02/2023  PCP: [name] [name], NP   Patient coming from: Home   Chief Complaint:  Chief Complaint  Patient presents with   Respiratory Distress   ED TRIAGE note:  Pt from home with EMS for sob and not feeling well for a few days. BLE edema noted. Initially EMS reported resp distress with room air sats at 80%. Given 10mg  albuterol  and .5mg  atrovent, 125mg  solumedrol, and 2g Mag enroute. Pt reports chronic home oxygen use at 4L via [place]   EMS VS 152/96, pulse 66, pulse ox at 100% with nebs     HPI:  [name] is a 68 y.o. female with medical history significant of chronic hypoxic respiratory failure, COPD, essential hypertension, ulcerative colitis, chronic pain syndrome, sciatica, actively smoking cigarette, hyperlipidemia and chronic noncardiac chest pain presented to emergency department complaining of shortness of breath and bilateral lower extremity edema.  EMS found O2 sat 80% and EMS gave albuterol  nebulizer, Solu-Medrol  and mag sulfate.  Patient is complaining about shortness of breath and wheezing.  Having productive cough and mucoid in color.  Denies any fever, chill, nausea and vomiting. Patient reported she continues to smoking cigarettes and use albuterol  nebulizer as needed for wheezing shortness of breath.  Patient is not any long-acting inhaler at home.  Patient is also endorsing mid central chest pain for last few months which has not been going away.  Physical exam revealed chest pain reproducible with palpation.   ED Course:  At presentation to ED patient is borderline hypotensive O2 sat 100% 2 L oxygen. VBG showing normal pH, chronic hypercarbia PCO268 at baseline, bicarb 35 and.  pCO2 47. Normal BNP 32.9. Troponin 5 within normal range. CBC unremarkable.  BMP showing slightly low sodium 133 and low chloride 97 otherwise unremarkable.  Chest x-ray no active disease  process.  EKG showed sinus tachycardia heart rate 105, premature ventricular complex.  Normal QT intervals.  There is no ST anterior abnormality.  In the ED patient received albuterol  nebulizer and DuoNeb nebulizer. Hospitalist has been consulted for further management of acute on chronic hypoxic respiratory failure in the context of COPD exacerbation.  Significant labs in the ED: Lab Orders         Resp panel by RT-PCR (RSV, Flu A&B, Covid) Anterior Nasal Swab         Respiratory (~20 pathogens) panel by PCR         Basic metabolic panel         CBC         Brain natriuretic peptide         Blood gas, venous (at [facility] and [facility])         CBC         Basic metabolic panel         CBG monitoring, ED       Review of Systems:  Review of Systems  Constitutional:  Negative for chills, fever, malaise/fatigue and weight loss.  Eyes:  Negative for blurred vision.  Respiratory:  Positive for cough, shortness of breath and wheezing. Negative for hemoptysis and sputum production.   Cardiovascular:  Positive for chest pain and orthopnea. Negative for palpitations and leg swelling.  Gastrointestinal:  Negative for abdominal pain, heartburn, nausea and vomiting.  Neurological:  Negative for dizziness, weakness and headaches.  Psychiatric/Behavioral:  The patient is not nervous/anxious.     Past Medical History:  Diagnosis Date  Arthritis    Asthma    Chest pain 2006   Chest pain 10/2016   Chronic back pain    Hypertension    Sciatica    Shortness of breath    due to  smoking     Past Surgical History:  Procedure Laterality Date   ABDOMINAL HYSTERECTOMY  1997   CARDIAC CATHETERIZATION  2006   HEMI-MICRODISCECTOMY LUMBAR LAMINECTOMY LEVEL 1  05/14/2012   Procedure: HEMI-MICRODISCECTOMY LUMBAR LAMINECTOMY LEVEL 1;  Surgeon: Tanda DELENA Heading, MD;  Location: [facility];  Service: Orthopedics;  Laterality: N/A;  Hemi Laminectomy Microdiscectomy L4 - L5 Central (X-Ray)   ingrown toenail removal      TONSILLECTOMY     TOTAL HIP ARTHROPLASTY Left 11/05/2013   Procedure: LEFT TOTAL HIP ARTHROPLASTY;  Surgeon: Tanda DELENA Heading, MD;  Location: [facility];  Service: Orthopedics;  Laterality: Left;     reports that she has been smoking cigarettes. She has a 3 pack-year smoking history. She has never used smokeless tobacco. She reports that she does not drink alcohol and does not use drugs.  No Known Allergies  Family History  Problem Relation Age of Onset   Heart attack Mother 109   Hypertension Sister    Diabetes type II Sister    Tongue cancer Brother    Heart attack Sister 16   Lung cancer Sister    Hypertension Sister     Prior to Admission medications   Medication Sig Start Date End Date Taking? Authorizing Provider  albuterol  (PROVENTIL ) (2.5 MG/3ML) 0.083% nebulizer solution Take 2.5 mg by nebulization every 6 (six) hours as needed for wheezing or shortness of breath.    [provider]  albuterol  (VENTOLIN  HFA) 108 (90 Base) MCG/ACT inhaler Inhale 2 puffs into the lungs every 4 (four) hours as needed for wheezing or shortness of breath. 07/24/23   Kara Dorn NOVAK, MD  dextromethorphan -guaiFENesin  (MUCINEX  DM) 30-600 MG 12hr tablet Take 1 tablet by mouth 2 (two) times daily. Patient not taking: Reported on 10/11/2023 09/06/23   Rizwan, Saima, MD  enalapril -hydrochlorothiazide  (VASERETIC ) 10-25 MG tablet Take 1 tablet by mouth daily.    [provider]  feeding supplement (ENSURE ENLIVE / ENSURE PLUS) LIQD Take 237 mLs by mouth 3 (three) times daily between meals. 09/06/23   Rizwan, Saima, MD  fluticasone -salmeterol (ADVAIR  DISKUS) 250-50 MCG/ACT AEPB Inhale 1 puff into the lungs in the morning and at bedtime. Patient taking differently: Inhale 1 puff into the lungs in the morning. 09/06/23   Rizwan, Saima, MD  hydrOXYzine  (ATARAX ) 25 MG tablet Take 25 mg by mouth 3 (three) times daily as needed for anxiety. 09/25/23   [provider]  ipratropium-albuterol   (DUONEB) 0.5-2.5 (3) MG/3ML SOLN Take 3 mLs by nebulization 3 (three) times daily. Patient taking differently: Take 3 mLs by nebulization every 6 (six) hours as needed. 07/24/23   Kara Dorn NOVAK, MD  mirtazapine  (REMERON ) 7.5 MG tablet Take 7.5 mg by mouth at bedtime. 09/26/23   [provider]  Multiple Vitamin (MULTIVITAMIN) capsule Take 1 capsule by mouth 3 (three) times a week.    [provider]  oxyCODONE -acetaminophen  (PERCOCET) 10-325 MG tablet Take 1 tablet by mouth See admin instructions. Take 1 tablet by mouth every 4-6 hours 12/23/20   [provider]  OXYGEN Inhale 3 L/min into the lungs as needed (for shortness of breath).    [provider]  polyethylene glycol (MIRALAX  / GLYCOLAX ) 17 g packet Take 17 g by mouth daily  as needed for mild constipation. 05/27/23   Tobie Yetta HERO, MD  predniSONE  (DELTASONE ) 10 MG tablet Take 4 tablets by mouth for 1 day, then 3 tablets by mouth for 1 day then 2 tablets by mouth for 1 day then 1 tablet daily for 1 day then stop 10/17/23   Drusilla Sabas RAMAN, MD  predniSONE  (DELTASONE ) 5 MG tablet Take 5 mg by mouth daily as needed (for respiratory flares- take with food).    [provider]  PRESCRIPTION MEDICATION See admin instructions. BiPAP- At bedtime    [provider]  zolpidem  (AMBIEN ) 5 MG tablet Take 5 mg by mouth at bedtime. 12/25/20   [provider]     Physical Exam: Vitals:   12/03/23 0300 12/03/23 0315 12/03/23 0330 12/03/23 0428  BP: 102/77 101/68 92/64 109/68  Pulse: 78 71 80 64  Resp: (!) 28 12 14 18   Temp:    98.4 F (36.9 C)  TempSrc:      SpO2: 100% 100% 100% 100%    Physical Exam Vitals and nursing note reviewed.  Constitutional:      Appearance: She is ill-appearing.  HENT:     Mouth/Throat:     Mouth: Mucous membranes are moist.  Eyes:     Pupils: Pupils are equal, round, and reactive to light.  Cardiovascular:     Rate and Rhythm: Regular rhythm.  Tachycardia present.     Pulses: Normal pulses.  Pulmonary:     Effort: Pulmonary effort is normal. No respiratory distress.     Breath sounds: No stridor. Wheezing present. No rhonchi or rales.  Chest:     Chest wall: Tenderness present.  Abdominal:     Palpations: Abdomen is soft.  Musculoskeletal:     Cervical back: Neck supple.     Right lower leg: No edema.     Left lower leg: No edema.  Skin:    Capillary Refill: Capillary refill takes less than 2 seconds.  Neurological:     Mental Status: She is alert and oriented to person, place, and time.  Psychiatric:        Mood and Affect: Mood normal.      Labs on Admission: I have personally reviewed following labs and imaging studies  CBC: Recent Labs  Lab 12/02/23 2209 12/03/23 0102  WBC 5.8 4.4  HGB 11.0* 10.9*  HCT 36.0 35.1*  MCV 95.5 95.6  PLT 283 249   Basic Metabolic Panel: Recent Labs  Lab 12/02/23 2209 12/03/23 0102  NA 133* 133*  K 3.5 3.2*  CL 95* 96*  CO2 28 29  GLUCOSE 107* 155*  BUN 9 9  CREATININE 0.65 0.54  CALCIUM  8.3* 8.2*   GFR: CrCl cannot be calculated (Unknown ideal weight.). Liver Function Tests: No results for input(s): AST, ALT, ALKPHOS, BILITOT, PROT, ALBUMIN in the last 168 hours. No results for input(s): LIPASE, AMYLASE in the last 168 hours. No results for input(s): AMMONIA in the last 168 hours. Coagulation Profile: No results for input(s): INR, PROTIME in the last 168 hours. Cardiac Enzymes: Recent Labs  Lab 12/02/23 2209 12/03/23 0102  TROPONINIHS 5 5   BNP (last 3 results) Recent Labs    09/04/23 2151 10/11/23 0638 12/02/23 2209  BNP 24.3 22.7 32.9   HbA1C: No results for input(s): HGBA1C in the last 72 hours. CBG: Recent Labs  Lab 12/03/23 0050  GLUCAP 158*   Lipid Profile: No results for input(s): CHOL, HDL, LDLCALC, TRIG, CHOLHDL, LDLDIRECT in the last  72 hours. Thyroid  Function Tests: No results for input(s):  TSH, T4TOTAL, FREET4, T3FREE, THYROIDAB in the last 72 hours. Anemia Panel: No results for input(s): VITAMINB12, FOLATE, FERRITIN, TIBC, IRON, RETICCTPCT in the last 72 hours. Urine analysis:    Component Value Date/Time   COLORURINE YELLOW 06/02/2022 1909   APPEARANCEUR HAZY (A) 06/02/2022 1909   LABSPEC 1.019 06/02/2022 1909   PHURINE 5.0 06/02/2022 1909   GLUCOSEU NEGATIVE 06/02/2022 1909   HGBUR NEGATIVE 06/02/2022 1909   BILIRUBINUR NEGATIVE 06/02/2022 1909   KETONESUR NEGATIVE 06/02/2022 1909   PROTEINUR NEGATIVE 06/02/2022 1909   UROBILINOGEN 0.2 10/29/2013 1114   NITRITE NEGATIVE 06/02/2022 1909   LEUKOCYTESUR TRACE (A) 06/02/2022 1909    Radiological Exams on Admission: I have personally reviewed images DG Chest Port 1 View Result Date: 12/02/2023 CLINICAL DATA:  Shortness of breath.  Low oxygen saturation. EXAM: PORTABLE CHEST 1 VIEW COMPARISON:  10/11/2023 FINDINGS: Heart size and pulmonary vascularity are normal. Lungs are clear and expanded. No pleural effusion or pneumothorax. Mediastinal contours appear intact. Calcification of the aorta. Degenerative changes in the spine and shoulders. IMPRESSION: No active disease. Electronically Signed   By: Elsie Gravely M.D.   On: 12/02/2023 22:38     EKG: My personal interpretation of EKG shows: EKG showed sinus tachycardia heart rate 105, premature ventricular complex.  Normal QT intervals.  There is no ST anterior abnormality..   Assessment/Plan: Principal Problem:   COPD with acute exacerbation (HCC) Active Problems:   Acute on chronic hypoxic respiratory failure (HCC)   Chronic hypoxic respiratory failure (HCC)   Continuous dependence on cigarette smoking   Essential hypertension   Non-cardiac chest pain   Generalized anxiety disorder   Acute exacerbation of chronic obstructive pulmonary disease (COPD) (HCC)    Assessment and Plan: COPD with acute exacerbation Acute on chronic hypoxic  respiratory failure Chronic hypoxic respiratory failure 2-3 per oxygen at baseline Chronic hypercarbic respiratory failure  compensated with respiratory alkalosis -Presented emergency department sudden onset of shortness of breath with associated mucoid cough and bilateral lower extremity edema.  EMS found O2 sat 80% room air.  Patient has been treated with Solu-Medrol , duo nebulizer and mag sulfate by EMS. - At presentation to ED O2 sat 100% to liter oxygen which is around baseline.  However blood pressure found borderline soft - Normal BNP.  Troponins x 2 within normal range.  VBG showed pH within normal range and pCO2 68 which is around baseline and elevated bicarb 35. -Chest x-ray unremarkable.  Pending respiratory panel. - CBC and BMP unremarkable. - Admitting for management of acute on chronic hypoxic respiratory failure in setting of severe exacerbation - Continue DuoNeb every 6 hours scheduled, albuterol  as needed, and Iv azithromycin  500 mg daily. - Continue supportive care.  Continuous dependence on smoking -Counseled patient at bedside for smoking cessation. - Continue nicotine  patch.  Noncardiac chest pain Musculoskeletal pain -Physical exam revealed musculoskeletal chest pain.  Complaining about chest congestion and chest pain.  Physical exam revealed reproducible mid central chest pain  Troponin times within normal range.  EKG unremarkable there is no evidence for history of abnormality.  There is no concern for acute coronary syndrome.   Essential hypertension In the setting of soft blood pressure holding home blood pressure regimens.  Generalized anxiety disorder -Continue Atarax  as needed and Remeron .  Insomnia -Holding Ambien  in the setting of acute hypoxic respiratory failure as high risk of the respiratory decompression.  Chronic pain syndrome Sciatica - Continue Tylenol  as  needed   DVT prophylaxis:  Lovenox  Code Status:  Full Code Diet: Heart healthy  diet Family Communication:  Family was present at bedside, at the time of interview. Opportunity was given to ask question and all questions were answered satisfactorily.  Disposition Plan: Continue monitor improvement of wheezing and shortness of breath/improvement of COPD exacerbation Consults: None indicated at this time Admission status:   Observation, Telemetry bed  Severity of Illness: The appropriate patient status for this patient is OBSERVATION. Observation status is judged to be reasonable and necessary in order to provide the required intensity of service to ensure the patient's safety. The patient's presenting symptoms, physical exam findings, and initial radiographic and laboratory data in the context of their medical condition is felt to place them at decreased risk for further clinical deterioration. Furthermore, it is anticipated that the patient will be medically stable for discharge from the hospital within 2 midnights of admission.     Jacqulyne Gladue, MD [facility]  How to contact the [facility] Attending or Consulting provider 7A - 7P or covering provider during after hours 7P -7A, for this patient.  Check the care team in [facility] and look for a) attending/consulting [facility] provider listed and b) the [facility] team listed Log into [email or web address] and use [place]'s universal password to access. If you do not have the password, please contact the hospital operator. Locate the [facility] provider you are looking for under [facility] and page to a number that you can be directly reached. If you still have difficulty reaching the provider, please page the [facility] (Director on Call) for the Hospitalists listed on amion for assistance.  12/03/2023, 4:40 AM

## 2023-12-02 NOTE — ED Provider Notes (Signed)
 [place] EMERGENCY DEPARTMENT AT [facility] Provider Note   CSN: [id number] Arrival date & time: 12/02/23  2150     Patient presents with: Respiratory Distress   [name] is a 68 y.o. female.  {Add pertinent medical, surgical, social history, OB history to HPI:32947} HPI     Presents due to shortness of breath.  Patient states over the past couple days has had increasing shortness of breath.  On 3 to 4 L nasal cannula chronically in the setting of COPD.  Patient was found on room air sats to be 80%.  Given Solu-Medrol , 2 mg of magnesium , Atrovent as well as a butyryl.  Subsequently O2 saturation increased to 100% with nebulizers.  Patient states that CCM some chest pain.  She states he usually has chest pain whenever she has shortness of breath that she states have been in the setting of her COPD.  No fever no chills.  No sick contacts that she is aware of.  No abdominal pain.  Previous medical history reviewed : Patient was admitted in May 2025.  Admitted in setting of acute respiratory failure with hypoxia.  COPD history.   Prior to Admission medications   Medication Sig Start Date End Date Taking? Authorizing Provider  albuterol  (PROVENTIL ) (2.5 MG/3ML) 0.083% nebulizer solution Take 2.5 mg by nebulization every 6 (six) hours as needed for wheezing or shortness of breath.    [provider]  albuterol  (VENTOLIN  HFA) 108 (90 Base) MCG/ACT inhaler Inhale 2 puffs into the lungs every 4 (four) hours as needed for wheezing or shortness of breath. 07/24/23   Kara Dorn NOVAK, MD  dextromethorphan -guaiFENesin  (MUCINEX  DM) 30-600 MG 12hr tablet Take 1 tablet by mouth 2 (two) times daily. Patient not taking: Reported on 10/11/2023 09/06/23   Rizwan, Saima, MD  enalapril -hydrochlorothiazide  (VASERETIC ) 10-25 MG tablet Take 1 tablet by mouth daily.    [provider]  feeding supplement (ENSURE ENLIVE / ENSURE PLUS) LIQD Take 237 mLs by mouth 3 (three) times  daily between meals. 09/06/23   Rizwan, Saima, MD  fluticasone -salmeterol (ADVAIR  DISKUS) 250-50 MCG/ACT AEPB Inhale 1 puff into the lungs in the morning and at bedtime. Patient taking differently: Inhale 1 puff into the lungs in the morning. 09/06/23   Rizwan, Saima, MD  hydrOXYzine  (ATARAX ) 25 MG tablet Take 25 mg by mouth 3 (three) times daily as needed for anxiety. 09/25/23   [provider]  ipratropium-albuterol  (DUONEB) 0.5-2.5 (3) MG/3ML SOLN Take 3 mLs by nebulization 3 (three) times daily. Patient taking differently: Take 3 mLs by nebulization every 6 (six) hours as needed. 07/24/23   Kara Dorn NOVAK, MD  mirtazapine  (REMERON ) 7.5 MG tablet Take 7.5 mg by mouth at bedtime. 09/26/23   [provider]  Multiple Vitamin (MULTIVITAMIN) capsule Take 1 capsule by mouth 3 (three) times a week.    [provider]  oxyCODONE -acetaminophen  (PERCOCET) 10-325 MG tablet Take 1 tablet by mouth See admin instructions. Take 1 tablet by mouth every 4-6 hours 12/23/20   [provider]  OXYGEN Inhale 3 L/min into the lungs as needed (for shortness of breath).    [provider]  polyethylene glycol (MIRALAX  / GLYCOLAX ) 17 g packet Take 17 g by mouth daily as needed for mild constipation. 05/27/23   Tobie Yetta HERO, MD  predniSONE  (DELTASONE ) 10 MG tablet Take 4 tablets by mouth for 1 day, then 3 tablets by mouth for 1 day then 2 tablets by mouth for 1 day then  1 tablet daily for 1 day then stop 10/17/23   Drusilla Sabas RAMAN, MD  predniSONE  (DELTASONE ) 5 MG tablet Take 5 mg by mouth daily as needed (for respiratory flares- take with food).    [provider]  PRESCRIPTION MEDICATION See admin instructions. BiPAP- At bedtime    [provider]  zolpidem  (AMBIEN ) 5 MG tablet Take 5 mg by mouth at bedtime. 12/25/20   [provider]    Allergies: Patient has no known allergies.    Review of Systems  Constitutional:  Negative for chills and fever.   HENT:  Negative for ear pain and sore throat.   Eyes:  Negative for pain and visual disturbance.  Respiratory:  Negative for cough and shortness of breath.   Cardiovascular:  Negative for chest pain and palpitations.  Gastrointestinal:  Negative for abdominal pain and vomiting.  Genitourinary:  Negative for dysuria and hematuria.  Musculoskeletal:  Negative for arthralgias and back pain.  Skin:  Negative for color change and rash.  Neurological:  Negative for seizures and syncope.  All other systems reviewed and are negative.   Updated Vital Signs BP (!) 99/41   Pulse 73   Temp 98 F (36.7 C) (Oral)   Resp 16   SpO2 100%   Physical Exam Vitals and nursing note reviewed.  Constitutional:      General: She is not in acute distress.    Appearance: She is well-developed.  HENT:     Head: Normocephalic and atraumatic.  Eyes:     Conjunctiva/sclera: Conjunctivae normal.  Cardiovascular:     Rate and Rhythm: Normal rate and regular rhythm.     Heart sounds: No murmur heard. Pulmonary:     Effort: Pulmonary effort is normal. No respiratory distress.     Breath sounds: Wheezing present.  Abdominal:     Palpations: Abdomen is soft.     Tenderness: There is no abdominal tenderness.  Musculoskeletal:        General: No swelling.     Cervical back: Neck supple.  Skin:    General: Skin is warm and dry.     Capillary Refill: Capillary refill takes less than 2 seconds.  Neurological:     Mental Status: She is alert.  Psychiatric:        Mood and Affect: Mood normal.     (all labs ordered are listed, but only abnormal results are displayed) Labs Reviewed  BASIC METABOLIC PANEL WITH GFR - Abnormal; Notable for the following components:      Result Value   Sodium 133 (*)    Chloride 95 (*)    Glucose, Bld 107 (*)    Calcium  8.3 (*)    All other components within normal limits  CBC - Abnormal; Notable for the following components:   RBC 3.77 (*)    Hemoglobin 11.0 (*)     All other components within normal limits  BLOOD GAS, VENOUS - Abnormal; Notable for the following components:   pCO2, Ven 68 (*)    pO2, Ven 47 (*)    Bicarbonate 35.9 (*)    Acid-Base Excess 7.4 (*)    All other components within normal limits  RESP PANEL BY RT-PCR (RSV, FLU A&B, COVID)  RVPGX2  BRAIN NATRIURETIC PEPTIDE  TROPONIN I (HIGH SENSITIVITY)    EKG: EKG Interpretation Date/Time:  Monday December 02 2023 22:04:56 EDT Ventricular Rate:  61 PR Interval:  158 QRS Duration:  92 QT Interval:  420 QTC Calculation: 423 R  Axis:   88  Text Interpretation: Sinus arrhythmia Borderline right axis deviation Probable anteroseptal infarct, old Confirmed by Simon Rea [phone]) on 12/02/2023 10:06:53 PM  Radiology: [facility] Chest Port 1 View Result Date: 12/02/2023 CLINICAL DATA:  Shortness of breath.  Low oxygen saturation. EXAM: PORTABLE CHEST 1 VIEW COMPARISON:  10/11/2023 FINDINGS: Heart size and pulmonary vascularity are normal. Lungs are clear and expanded. No pleural effusion or pneumothorax. Mediastinal contours appear intact. Calcification of the aorta. Degenerative changes in the spine and shoulders. IMPRESSION: No active disease. Electronically Signed   By: Elsie Gravely M.D.   On: 12/02/2023 22:38    {Document cardiac monitor, telemetry assessment procedure when appropriate:32947} Procedures   Medications Ordered in the ED  albuterol  (PROVENTIL ) (2.5 MG/3ML) 0.083% nebulizer solution 2.5 mg (has no administration in time range)  ipratropium-albuterol  (DUONEB) 0.5-2.5 (3) MG/3ML nebulizer solution 3 mL (3 mLs Nebulization Given 12/02/23 2256)  oxyCODONE  (Oxy IR/ROXICODONE ) immediate release tablet 5 mg (5 mg Oral Given 12/02/23 2302)      {Click here for ABCD2, HEART and other calculators REFRESH Note before signing:1}                              Medical Decision Making Amount and/or Complexity of Data Reviewed Labs: ordered. Radiology: ordered.  Risk Prescription drug  management.   Previous medical history reviewed : Patient was admitted in May 2025.  Admitted in setting of acute respiratory failure with hypoxia.  COPD history.   Presents due to shortness of breath.  Patient states over the past couple days has had increasing shortness of breath.  On 3 to 4 L nasal cannula chronically in the setting of COPD.  Patient was found on room air sats to be 80%.  Given Solu-Medrol , 2 mg of magnesium , Atrovent as well as a butyryl.  Subsequently O2 saturation increased to 100% with nebulizers.  Patient states that CCM some chest pain.  She states he usually has chest pain whenever she has shortness of breath that she states have been in the setting of her COPD.  No fever no chills.  No sick contacts that she is aware of.  No abdominal pain.   Patient was initially receiving her treatment when I saw the patient.  Was able to transition patient from nonrebreather to 4 L of nasal cannula.  O2 saturation high 90s.  Did obtain VBG.  Hypercapnia but appears well compensated and likely at her baseline based off this VBG.  No need for BiPAP at this point time.  Reviewed EKG.  No  STEMI.  Does endorse chest pain.  Likely in setting of COPD exacerbation.  Did obtain chest x-ray.  No pneumonia.  No concerns for DVT or PE.  No pleuritic chest pain hemoptysis.  No history of DVT or PE.   Troponin unremarkable.  Will trend.  Spoke to  hospitalist.  Patient will be admitted for further care.   {Document critical care time when appropriate  Document review of labs and clinical decision tools ie CHADS2VASC2, etc  Document your independent review of radiology images and any outside records  Document your discussion with family members, caretakers and with consultants  Document social determinants of health affecting pt's care  Document your decision making why or why not admission, treatments were needed:32947:::1}   Final diagnoses:  COPD exacerbation (HCC)  Hypercapnia  Chest  pain, unspecified type    ED Discharge Orders  None

## 2023-12-03 DIAGNOSIS — Z8249 Family history of ischemic heart disease and other diseases of the circulatory system: Secondary | ICD-10-CM | POA: Diagnosis not present

## 2023-12-03 DIAGNOSIS — R0689 Other abnormalities of breathing: Secondary | ICD-10-CM | POA: Diagnosis present

## 2023-12-03 DIAGNOSIS — E785 Hyperlipidemia, unspecified: Secondary | ICD-10-CM | POA: Diagnosis present

## 2023-12-03 DIAGNOSIS — Z801 Family history of malignant neoplasm of trachea, bronchus and lung: Secondary | ICD-10-CM | POA: Diagnosis not present

## 2023-12-03 DIAGNOSIS — J441 Chronic obstructive pulmonary disease with (acute) exacerbation: Secondary | ICD-10-CM

## 2023-12-03 DIAGNOSIS — J9611 Chronic respiratory failure with hypoxia: Secondary | ICD-10-CM | POA: Diagnosis not present

## 2023-12-03 DIAGNOSIS — F411 Generalized anxiety disorder: Secondary | ICD-10-CM | POA: Diagnosis present

## 2023-12-03 DIAGNOSIS — G4700 Insomnia, unspecified: Secondary | ICD-10-CM | POA: Diagnosis present

## 2023-12-03 DIAGNOSIS — Z96642 Presence of left artificial hip joint: Secondary | ICD-10-CM | POA: Diagnosis present

## 2023-12-03 DIAGNOSIS — Z90710 Acquired absence of both cervix and uterus: Secondary | ICD-10-CM | POA: Diagnosis not present

## 2023-12-03 DIAGNOSIS — J9622 Acute and chronic respiratory failure with hypercapnia: Secondary | ICD-10-CM | POA: Diagnosis present

## 2023-12-03 DIAGNOSIS — Z9981 Dependence on supplemental oxygen: Secondary | ICD-10-CM | POA: Diagnosis not present

## 2023-12-03 DIAGNOSIS — F17210 Nicotine dependence, cigarettes, uncomplicated: Secondary | ICD-10-CM | POA: Diagnosis present

## 2023-12-03 DIAGNOSIS — E876 Hypokalemia: Secondary | ICD-10-CM | POA: Diagnosis present

## 2023-12-03 DIAGNOSIS — M5430 Sciatica, unspecified side: Secondary | ICD-10-CM | POA: Diagnosis present

## 2023-12-03 DIAGNOSIS — Z1152 Encounter for screening for COVID-19: Secondary | ICD-10-CM | POA: Diagnosis not present

## 2023-12-03 DIAGNOSIS — Z716 Tobacco abuse counseling: Secondary | ICD-10-CM | POA: Diagnosis not present

## 2023-12-03 DIAGNOSIS — Z7951 Long term (current) use of inhaled steroids: Secondary | ICD-10-CM | POA: Diagnosis not present

## 2023-12-03 DIAGNOSIS — Z833 Family history of diabetes mellitus: Secondary | ICD-10-CM | POA: Diagnosis not present

## 2023-12-03 DIAGNOSIS — E871 Hypo-osmolality and hyponatremia: Secondary | ICD-10-CM | POA: Diagnosis present

## 2023-12-03 DIAGNOSIS — G894 Chronic pain syndrome: Secondary | ICD-10-CM | POA: Diagnosis present

## 2023-12-03 DIAGNOSIS — I10 Essential (primary) hypertension: Secondary | ICD-10-CM | POA: Diagnosis present

## 2023-12-03 DIAGNOSIS — Z808 Family history of malignant neoplasm of other organs or systems: Secondary | ICD-10-CM | POA: Diagnosis not present

## 2023-12-03 DIAGNOSIS — M7918 Myalgia, other site: Secondary | ICD-10-CM | POA: Diagnosis present

## 2023-12-03 DIAGNOSIS — J9621 Acute and chronic respiratory failure with hypoxia: Secondary | ICD-10-CM | POA: Diagnosis present

## 2023-12-03 DIAGNOSIS — Z79899 Other long term (current) drug therapy: Secondary | ICD-10-CM | POA: Diagnosis not present

## 2023-12-03 LAB — BASIC METABOLIC PANEL WITH GFR
Anion gap: 8 (ref 5–15)
BUN: 9 mg/dL (ref 8–23)
CO2: 29 mmol/L (ref 22–32)
Calcium: 8.2 mg/dL — ABNORMAL LOW (ref 8.9–10.3)
Chloride: 96 mmol/L — ABNORMAL LOW (ref 98–111)
Creatinine, Ser: 0.54 mg/dL (ref 0.44–1.00)
GFR, Estimated: >60 mL/min (ref >60)
Glucose, Bld: 155 mg/dL — ABNORMAL HIGH (ref 70–99)
Potassium: 3.2 mmol/L — ABNORMAL LOW (ref 3.5–5.1)
Sodium: 133 mmol/L — ABNORMAL LOW (ref 135–145)

## 2023-12-03 LAB — RESPIRATORY PANEL BY PCR

## 2023-12-03 LAB — CBC
HCT: 35.1 % — ABNORMAL LOW (ref 36.0–46.0)
Hemoglobin: 10.9 g/dL — ABNORMAL LOW (ref 12.0–15.0)
MCH: 29.7 pg (ref 26.0–34.0)
MCHC: 31.1 g/dL (ref 30.0–36.0)
MCV: 95.6 fL (ref 80.0–100.0)
Platelets: 249 K/uL (ref 150–400)
RBC: 3.67 MIL/uL — ABNORMAL LOW (ref 3.87–5.11)
RDW: 14.3 % (ref 11.5–15.5)
WBC: 4.4 K/uL (ref 4.0–10.5)
nRBC: 0 % (ref 0.0–0.2)

## 2023-12-03 LAB — CBG MONITORING, ED: Glucose-Capillary: 158 mg/dL — ABNORMAL HIGH (ref 70–99)

## 2023-12-03 LAB — TROPONIN I (HIGH SENSITIVITY): Troponin I (High Sensitivity): 5 ng/L (ref <18)

## 2023-12-03 MED ORDER — METHYLPREDNISOLONE SODIUM SUCC 40 MG IJ SOLR
40.0000 mg | Freq: Two times a day (BID) | INTRAMUSCULAR | Status: DC
  Filled 2023-12-03: qty 1

## 2023-12-03 MED ORDER — OXYCODONE HCL 5 MG PO TABS
5.0000 mg | ORAL_TABLET | Freq: Four times a day (QID) | ORAL | Status: DC | PRN
  Administered 2023-12-03 – 2023-12-04 (×3): 5 mg via ORAL
  Filled 2023-12-03 (×3): qty 1

## 2023-12-03 MED ORDER — OXYCODONE-ACETAMINOPHEN 10-325 MG PO TABS: 1.0000 | ORAL_TABLET | Freq: Four times a day (QID) | ORAL | Status: DC | PRN

## 2023-12-03 MED ORDER — METHYLPREDNISOLONE SODIUM SUCC 125 MG IJ SOLR
80.0000 mg | Freq: Two times a day (BID) | INTRAMUSCULAR | Status: DC
  Administered 2023-12-03 – 2023-12-04 (×2): 80 mg via INTRAVENOUS
  Filled 2023-12-03 (×2): qty 2

## 2023-12-03 MED ORDER — GUAIFENESIN 100 MG/5ML PO LIQD
10.0000 mL | Freq: Three times a day (TID) | ORAL | Status: DC
  Administered 2023-12-03 – 2023-12-06 (×11): 10 mL via ORAL
  Filled 2023-12-03 (×11): qty 10

## 2023-12-03 MED ORDER — OXYCODONE HCL 5 MG PO TABS
5.0000 mg | ORAL_TABLET | Freq: Once | ORAL | Status: DC
  Filled 2023-12-03: qty 1

## 2023-12-03 MED ORDER — LACTATED RINGERS IV SOLN: INTRAVENOUS | Status: AC

## 2023-12-03 MED ORDER — OXYCODONE HCL 5 MG PO TABS
5.0000 mg | ORAL_TABLET | Freq: Three times a day (TID) | ORAL | Status: DC | PRN
  Administered 2023-12-03 (×2): 5 mg via ORAL
  Filled 2023-12-03: qty 1

## 2023-12-03 MED ORDER — POTASSIUM CHLORIDE CRYS ER 20 MEQ PO TBCR
40.0000 meq | EXTENDED_RELEASE_TABLET | Freq: Once | ORAL | Status: AC
  Administered 2023-12-03: 40 meq via ORAL
  Filled 2023-12-03: qty 2

## 2023-12-03 MED ORDER — OXYCODONE-ACETAMINOPHEN 5-325 MG PO TABS
1.0000 | ORAL_TABLET | Freq: Three times a day (TID) | ORAL | Status: DC | PRN
  Administered 2023-12-03: 1 via ORAL
  Filled 2023-12-03: qty 1

## 2023-12-03 MED ORDER — OXYCODONE-ACETAMINOPHEN 10-325 MG PO TABS: 1.0000 | ORAL_TABLET | Freq: Three times a day (TID) | ORAL | Status: DC | PRN

## 2023-12-03 MED ORDER — AZITHROMYCIN 250 MG PO TABS
500.0000 mg | ORAL_TABLET | Freq: Every day | ORAL | Status: DC
  Administered 2023-12-03 – 2023-12-05 (×3): 500 mg via ORAL
  Filled 2023-12-03 (×3): qty 2

## 2023-12-03 MED ORDER — OXYCODONE-ACETAMINOPHEN 10-325 MG PO TABS: 1.0000 | ORAL_TABLET | ORAL | Status: DC

## 2023-12-03 MED ORDER — IPRATROPIUM-ALBUTEROL 0.5-2.5 (3) MG/3ML IN SOLN
3.0000 mL | Freq: Two times a day (BID) | RESPIRATORY_TRACT | Status: DC
  Administered 2023-12-04 – 2023-12-05 (×4): 3 mL via RESPIRATORY_TRACT
  Filled 2023-12-03 (×4): qty 3

## 2023-12-03 MED ORDER — MELATONIN 3 MG PO TABS
3.0000 mg | ORAL_TABLET | Freq: Once | ORAL | Status: AC
  Administered 2023-12-03: 3 mg via ORAL
  Filled 2023-12-03: qty 1

## 2023-12-03 MED ORDER — ALUM & MAG HYDROXIDE-SIMETH 200-200-20 MG/5ML PO SUSP
30.0000 mL | ORAL | Status: DC | PRN
  Administered 2023-12-03: 30 mL via ORAL
  Filled 2023-12-03: qty 30

## 2023-12-03 MED ORDER — ATORVASTATIN CALCIUM 40 MG PO TABS
40.0000 mg | ORAL_TABLET | Freq: Every day | ORAL | Status: DC
  Administered 2023-12-03 – 2023-12-06 (×4): 40 mg via ORAL
  Filled 2023-12-03 (×4): qty 1

## 2023-12-03 MED ORDER — MAGNESIUM SULFATE 2 GM/50ML IV SOLN
2.0000 g | Freq: Once | INTRAVENOUS | Status: AC
  Administered 2023-12-03: 2 g via INTRAVENOUS
  Filled 2023-12-03: qty 50

## 2023-12-03 MED ORDER — OXYCODONE HCL 5 MG PO TABS
5.0000 mg | ORAL_TABLET | Freq: Three times a day (TID) | ORAL | Status: DC | PRN
  Administered 2023-12-03: 5 mg via ORAL
  Filled 2023-12-03: qty 1

## 2023-12-03 MED ORDER — OXYCODONE-ACETAMINOPHEN 5-325 MG PO TABS
1.0000 | ORAL_TABLET | Freq: Four times a day (QID) | ORAL | Status: DC | PRN
  Administered 2023-12-03 – 2023-12-04 (×3): 1 via ORAL
  Filled 2023-12-03 (×3): qty 1

## 2023-12-03 NOTE — Progress Notes (Incomplete)
 Patient was requesting to be on her home pain medication which is Percocet 10-325 mg 1 tablet every 4-6 hours as needed. This nurse advised the patient that the order was not correct in the computer and that I would page the doctor. Patient also requested new oxygen tubing because she didn't like the one that she had. Patient also requested

## 2023-12-03 NOTE — Progress Notes (Addendum)
 PROGRESS NOTE    Patient: [name]                            PCP: [name] [name], NP                    DOB: 23-Mar-1956            DOA: 12/02/2023 FMW:[id number]             DOS: 12/03/2023, 10:52 AM   LOS: 0 days   Date of Service: The patient was seen and examined on 12/03/2023  Subjective:   The patient was seen and examined this morning. Hemodynamically stable. Still complaining of shortness of breath, wheezing at rest. Currently satting 96% down from 4.3 L of oxygen Would like to have her home regimen pain medications, No issues overnight .  Brief Narrative:   [name] is a 68 y.o. female with medical history significant of chronic hypoxic respiratory failure, COPD, essential hypertension, ulcerative colitis, chronic pain syndrome, sciatica, actively smoking cigarette, hyperlipidemia and chronic noncardiac chest pains presented to emergency department complaining of shortness of breath and bilateral lower extremity edema.    EMS found O2 sat 80% and EMS gave albuterol  nebulizer, Solu-Medrol  and mag sulfate.   Patient reported she continues to smoking cigarettes and use albuterol  nebulizer as needed for wheezing shortness of breath.  Patient is not any long-acting inhaler at home.   Patient is also endorsing mid central chest pain for last few months which has not been going away.  Physical exam revealed chest pain reproducible with palpation.     ED Course:  Patient was borderline hypotensive O2 sat 100% 2 L oxygen. VBG showing normal pH, chronic hypercarbia PCO268 at baseline, bicarb 35 and.  pCO2 47. Normal BNP 32.9. Troponin 5 within normal range. CBC unremarkable.  BMP showing slightly low sodium 133 and low chloride 97 otherwise unremarkable.   Chest x-ray no active disease process.   EKG showed sinus tachycardia heart rate 105, premature ventricular complex.  Normal QT intervals.  There is no ST anterior abnormality.    Assessment & Plan:    Principal Problem:   COPD with acute exacerbation (HCC) Active Problems:   Acute on chronic hypoxic respiratory failure (HCC)   Chronic hypoxic respiratory failure (HCC)   Continuous dependence on cigarette smoking   Essential hypertension   Non-cardiac chest pain   Generalized anxiety disorder   Acute exacerbation of chronic obstructive pulmonary disease (COPD) (HCC)   Acute on chronic hypoxic respiratory failure COPD with acute exacerbation  -Chronic hypoxic respiratory failure 2-3 per oxygen at baseline Chronic hypercarbic respiratory failure  compensated with respiratory alkalosis With diffuse wheezing and rhonchi  -POA: SOB with  EMS found O2 sat 80% room air.  Patient has been treated with Solu-Medrol , duo nebulizer and mag sulfate by EMS.  - On arrival needed up to 4 L of oxygen, -currently on 3 L of oxygen, satting 96%  - Normal BNP.  Troponins x 2 within normal range.   -VBG showed pH within normal range and pCO2 68 which is around baseline and elevated bicarb 35.  -Chest x-ray unremarkable.  Pending respiratory panel.  - Continue nebs scheduled every 6 and as needed, mucolytics, - Continuing IV steroids - Continue empiric antibiotics  Chronic tobacco abuse - Advised smoking cessation altogether patient has tapered down to approximately 5 cigarettes a day Continue NicoDerm patch  Continuous dependence on smoking -Counseled patient at bedside for smoking cessation. - Continue nicotine  patch.   Noncardiac chest pain/chronic consistent with musculoskeletal pain -Continue monitoring closely -As needed analgesics -No changes on EKG Troponin times within normal range.   - No concern for acute coronary syndrome.     Essential hypertension Not on any medication, blood pressure running soft-monitoring   Generalized anxiety disorder -Continue Atarax  as needed and Remeron .   Insomnia -Holding Ambien  in the setting of acute hypoxic respiratory failure as  high risk of the respiratory decompression.   Chronic pain syndrome Sciatica - Continuing  regimen as needed oxycodone /acetaminophen    ---------------------------------------------------------------------------------------------------------- Nutritional status:  The patient's BMI is: There is no height or weight on file to calculate BMI. I agree with the assessment and plan as outlined -----------------------------------------------------------------------------------------------------------  DVT prophylaxis:  enoxaparin  (LOVENOX ) injection 40 mg Start: 12/03/23 1000 SCDs Start: 12/02/23 2336 Place TED hose Start: 12/02/23 2336   Code Status:   Code Status: Full Code  Family Communication: No family member present at bedside- -Advance care planning has been discussed.   Admission status:   Status is: Observation The patient remains OBS appropriate and will d/c before 2 midnights.   Disposition: From  - home             Planning for discharge in 1-2 days: to   Procedures:   No admission procedures for hospital encounter.   Antimicrobials:  Anti-infectives (From admission, onward)    Start     Dose/Rate Route Frequency Ordered Stop   12/02/23 2345  azithromycin  (ZITHROMAX ) 500 mg in sodium chloride  0.9 % 250 mL IVPB        500 mg 250 mL/hr over 60 Minutes Intravenous Every 24 hours 12/02/23 2335 12/07/23 2344        Medication:   enoxaparin  (LOVENOX ) injection  40 mg Subcutaneous Q24H   guaiFENesin   10 mL Oral Q8H   ipratropium-albuterol   3 mL Nebulization Q6H   methylPREDNISolone  (SOLU-MEDROL ) injection  80 mg Intravenous Q12H   mirtazapine   7.5 mg Oral QHS   nicotine   14 mg Transdermal Daily   sodium chloride  flush  3 mL Intravenous Q12H   sodium chloride  flush  3 mL Intravenous Q12H    sodium chloride , acetaminophen  **OR** acetaminophen , albuterol , alum & mag hydroxide-simeth, benzonatate , hydrOXYzine , ondansetron  **OR** ondansetron  (ZOFRAN ) IV,  oxyCODONE -acetaminophen  **AND** oxyCODONE , sodium chloride  flush   Objective:   Vitals:   12/03/23 0330 12/03/23 0428 12/03/23 0732 12/03/23 0808  BP: 92/64 109/68  105/70  Pulse: 80 64  62  Resp: 14 18  17   Temp:  98.4 F (36.9 C)  98.6 F (37 C)  TempSrc:      SpO2: 100% 100% 100% 96%    Intake/Output Summary (Last 24 hours) at 12/03/2023 1052 Last data filed at 12/03/2023 0600 Gross per 24 hour  Intake 1809.38 ml  Output --  Net 1809.38 ml   There were no vitals filed for this visit.   Physical examination:   Constitution:  Alert, cooperative, no distress,  Appears calm and comfortable   HEENT:        Normocephalic, PERRL, otherwise with in Normal limits  Chest:         Chest symmetric Cardio vascular:  S1/S2, RRR, No murmure, No Rubs or Gallops  pulmonary: Shortness of breath at rest and with minimal exertion Diffuse wheezing with rhonchi negative any crackles positive breath sounds diffusely-diffuse congestion Abdomen: Soft, non-tender, non-distended, bowel sounds,no masses, no organomegaly Muscular skeletal:  Limited exam - in bed, able to move all 4 extremities,   Neuro: CNII-XII intact. , normal motor and sensation, reflexes intact  Extremities: No pitting edema lower extremities, +2 pulses  Skin: Dry, warm to touch, negative for any Rashes, No open wounds Wounds: per nursing documentation   ------------------------------------------------------------------------------------------------------------------------------------------    LABs:     Latest Ref Rng & Units 12/03/2023    1:02 AM 12/02/2023   10:09 PM 10/12/2023   11:16 AM  CBC  WBC 4.0 - 10.5 K/uL 4.4  5.8  9.4   Hemoglobin 12.0 - 15.0 g/dL 89.0  88.9  87.0   Hematocrit 36.0 - 46.0 % 35.1  36.0  42.8   Platelets 150 - 400 K/uL 249  283  299       Latest Ref Rng & Units 12/03/2023    1:02 AM 12/02/2023   10:09 PM 10/12/2023   11:16 AM  CMP  Glucose 70 - 99 mg/dL 844  892  78   BUN 8 - 23 mg/dL 9  9  17     Creatinine 0.44 - 1.00 mg/dL 9.45  9.34  9.47   Sodium 135 - 145 mmol/L 133  133  136   Potassium 3.5 - 5.1 mmol/L 3.2  3.5  3.6   Chloride 98 - 111 mmol/L 96  95  95   CO2 22 - 32 mmol/L 29  28  31    Calcium  8.9 - 10.3 mg/dL 8.2  8.3  9.9        Micro Results Recent Results (from the past 240 hours)  Resp panel by RT-PCR (RSV, Flu A&B, Covid) Anterior Nasal Swab     Status: None   Collection Time: 12/02/23 10:58 PM   Specimen: Anterior Nasal Swab  Result Value Ref Range Status   SARS Coronavirus 2 by RT PCR NEGATIVE NEGATIVE Final    Comment: (NOTE) SARS-CoV-2 target nucleic acids are NOT DETECTED.  The SARS-CoV-2 RNA is generally detectable in upper respiratory specimens during the acute phase of infection. The lowest concentration of SARS-CoV-2 viral copies this assay can detect is 138 copies/mL. A negative result does not preclude SARS-Cov-2 infection and should not be used as the sole basis for treatment or other patient management decisions. A negative result may occur with  improper specimen collection/handling, submission of specimen other than nasopharyngeal swab, presence of viral mutation(s) within the areas targeted by this assay, and inadequate number of viral copies(<138 copies/mL). A negative result must be combined with clinical observations, patient history, and epidemiological information. The expected result is Negative.  Fact Sheet for Patients:  BloggerCourse.com  Fact Sheet for Healthcare Providers:  SeriousBroker.it  This test is no t yet approved or cleared by the [place]  FDA and  has been authorized for detection and/or diagnosis of SARS-CoV-2 by FDA under an Emergency Use Authorization (EUA). This EUA will remain  in effect (meaning this test can be used) for the duration of the COVID-19 declaration under Section 564(b)(1) of the Act, 21 U.S.C.section 360bbb-3(b)(1), unless the  authorization is terminated  or revoked sooner.       Influenza A by PCR NEGATIVE NEGATIVE Final   Influenza B by PCR NEGATIVE NEGATIVE Final    Comment: (NOTE) The Xpert Xpress SARS-CoV-2/FLU/RSV plus assay is intended as an aid in the diagnosis of influenza from Nasopharyngeal swab specimens and should not be used as a sole basis for treatment. Nasal washings and aspirates are unacceptable for Xpert Xpress SARS-CoV-2/FLU/RSV testing.  Fact Sheet  for Patients: BloggerCourse.com  Fact Sheet for Healthcare Providers: SeriousBroker.it  This test is not yet approved or cleared by the [place]  FDA and has been authorized for detection and/or diagnosis of SARS-CoV-2 by FDA under an Emergency Use Authorization (EUA). This EUA will remain in effect (meaning this test can be used) for the duration of the COVID-19 declaration under Section 564(b)(1) of the Act, 21 U.S.C. section 360bbb-3(b)(1), unless the authorization is terminated or revoked.     Resp Syncytial Virus by PCR NEGATIVE NEGATIVE Final    Comment: (NOTE) Fact Sheet for Patients: BloggerCourse.com  Fact Sheet for Healthcare Providers: SeriousBroker.it  This test is not yet approved or cleared by the [place]  FDA and has been authorized for detection and/or diagnosis of SARS-CoV-2 by FDA under an Emergency Use Authorization (EUA). This EUA will remain in effect (meaning this test can be used) for the duration of the COVID-19 declaration under Section 564(b)(1) of the Act, 21 U.S.C. section 360bbb-3(b)(1), unless the authorization is terminated or revoked.  Performed at [facility], [address], [place], [place] [address]   Respiratory (~20 pathogens) panel by PCR     Status: None   Collection Time: 12/02/23 10:58 PM   Specimen: Nasopharyngeal Swab; Respiratory  Result Value Ref Range  Status   Adenovirus NOT DETECTED NOT DETECTED Final   Coronavirus 229E NOT DETECTED NOT DETECTED Final    Comment: (NOTE) The Coronavirus on the Respiratory Panel, DOES NOT test for the novel  Coronavirus (2019 nCoV)    Coronavirus HKU1 NOT DETECTED NOT DETECTED Final   Coronavirus NL63 NOT DETECTED NOT DETECTED Final   Coronavirus OC43 NOT DETECTED NOT DETECTED Final   Metapneumovirus NOT DETECTED NOT DETECTED Final   Rhinovirus / Enterovirus NOT DETECTED NOT DETECTED Final   Influenza A NOT DETECTED NOT DETECTED Final   Influenza B NOT DETECTED NOT DETECTED Final   Parainfluenza Virus 1 NOT DETECTED NOT DETECTED Final   Parainfluenza Virus 2 NOT DETECTED NOT DETECTED Final   Parainfluenza Virus 3 NOT DETECTED NOT DETECTED Final   Parainfluenza Virus 4 NOT DETECTED NOT DETECTED Final   Respiratory Syncytial Virus NOT DETECTED NOT DETECTED Final   Bordetella pertussis NOT DETECTED NOT DETECTED Final   Bordetella Parapertussis NOT DETECTED NOT DETECTED Final   Chlamydophila pneumoniae NOT DETECTED NOT DETECTED Final   Mycoplasma pneumoniae NOT DETECTED NOT DETECTED Final    Comment: Performed at [facility], [address] [place], [place] [address]    Radiology Reports DG Chest [place] 1 View Result Date: 12/02/2023 CLINICAL DATA:  Shortness of breath.  Low oxygen saturation. EXAM: PORTABLE CHEST 1 VIEW COMPARISON:  10/11/2023 FINDINGS: Heart size and pulmonary vascularity are normal. Lungs are clear and expanded. No pleural effusion or pneumothorax. Mediastinal contours appear intact. Calcification of the aorta. Degenerative changes in the spine and shoulders. IMPRESSION: No active disease. Electronically Signed   By: Elsie Gravely M.D.   On: 12/02/2023 22:38    SIGNED: Adriana DELENA Grams, MD, FHM. FAAFP. Jolynn Pack - [facility] hospitalist Time spent - 55 min.  In seeing, evaluating and examining the patient. Reviewing medical records, labs, drawn plan of care. [facility],  Pager (please use [email or web address] to page/ text) Please use Epic Secure Chat for non-urgent communication (7AM-7PM)  If 7PM-7AM, please contact night-coverage [email or web address], 12/03/2023, 10:52 AM

## 2023-12-03 NOTE — Plan of Care (Signed)

## 2023-12-03 NOTE — Progress Notes (Signed)
 This charge nurse answered patient's call light complaining of pain and room temperature.   It was explained when patient could have pain meds and room temperature was checked.  Patient became angry because this nurse answered light and not her previous nurse, Damien.  She stated that she was calling her daughter and needed her paperwork to get the hell carolanne here  MD notified.    Chiquita LULLA Longs, RN

## 2023-12-03 NOTE — Hospital Course (Addendum)
 [name] is a 68 y.o. female with medical history significant of chronic hypoxic respiratory failure, COPD, essential hypertension, ulcerative colitis, chronic pain syndrome, sciatica, actively smoking cigarette, hyperlipidemia and chronic noncardiac chest pains presented to emergency department complaining of shortness of breath and bilateral lower extremity edema.    EMS found O2 sat 80% and EMS gave albuterol  nebulizer, Solu-Medrol  and mag sulfate.   Patient reported she continues to smoking cigarettes and use albuterol  nebulizer as needed for wheezing shortness of breath.  Patient is not any long-acting inhaler at home.   Patient is also endorsing mid central chest pain for last few months which has not been going away.  Physical exam revealed chest pain reproducible with palpation.     ED Course:  Patient was borderline hypotensive O2 sat 100% 2 L oxygen. VBG showing normal pH, chronic hypercarbia PCO268 at baseline, bicarb 35 and.  pCO2 47. Normal BNP 32.9. Troponin 5 within normal range. CBC unremarkable.  BMP showing slightly low sodium 133 and low chloride 97 otherwise unremarkable.   Chest x-ray no active disease process.   EKG showed sinus tachycardia heart rate 105, premature ventricular complex.  Normal QT intervals.  There is no ST anterior abnormality.    Assessment & Plan:   Principal Problem:   COPD with acute exacerbation (HCC) Active Problems:   Acute on chronic hypoxic respiratory failure (HCC)   Chronic hypoxic respiratory failure (HCC)   Continuous dependence on cigarette smoking   Essential hypertension   Non-cardiac chest pain   Generalized anxiety disorder   Acute exacerbation of chronic obstructive pulmonary disease (COPD) (HCC)   Acute on chronic hypoxic respiratory failure COPD with acute exacerbation  -Chronic hypoxic respiratory failure 2-3 per oxygen at baseline Chronic hypercarbic respiratory failure  compensated with respiratory  alkalosis With diffuse wheezing and rhonchi  -POA: SOB with  EMS found O2 sat 80% room air.  Patient has been treated with Solu-Medrol , duo nebulizer and mag sulfate by EMS.  - On arrival needed up to 4 L of oxygen, -currently on 3 L of oxygen, satting 96%  - Normal BNP.  Troponins x 2 within normal range.   -VBG showed pH within normal range and pCO2 68 which is around baseline and elevated bicarb 35.  -Chest x-ray unremarkable.  Pending respiratory panel.  - Continue nebs scheduled every 6 and as needed, mucolytics, - Continuing IV steroids - Continue empiric antibiotics  Chronic tobacco abuse - Advised smoking cessation altogether patient has tapered down to approximately 5 cigarettes a day Continue NicoDerm patch    Continuous dependence on smoking -Counseled patient at bedside for smoking cessation. - Continue nicotine  patch.   Noncardiac chest pain/chronic consistent with musculoskeletal pain -Continue monitoring closely -As needed analgesics -No changes on EKG Troponin times within normal range.   - No concern for acute coronary syndrome.     Essential hypertension Not on any medication, blood pressure running soft-monitoring   Generalized anxiety disorder -Continue Atarax  as needed and Remeron .   Insomnia -Holding Ambien  in the setting of acute hypoxic respiratory failure as high risk of the respiratory decompression.   Chronic pain syndrome Sciatica - Continuing  regimen as needed oxycodone /acetaminophen 

## 2023-12-04 DIAGNOSIS — J9611 Chronic respiratory failure with hypoxia: Secondary | ICD-10-CM | POA: Diagnosis not present

## 2023-12-04 DIAGNOSIS — J441 Chronic obstructive pulmonary disease with (acute) exacerbation: Secondary | ICD-10-CM | POA: Diagnosis not present

## 2023-12-04 DIAGNOSIS — J9621 Acute and chronic respiratory failure with hypoxia: Secondary | ICD-10-CM | POA: Diagnosis not present

## 2023-12-04 DIAGNOSIS — F17210 Nicotine dependence, cigarettes, uncomplicated: Secondary | ICD-10-CM | POA: Diagnosis not present

## 2023-12-04 MED ORDER — POLYETHYLENE GLYCOL 3350 17 G PO PACK
17.0000 g | PACK | Freq: Every day | ORAL | Status: DC
  Administered 2023-12-04 – 2023-12-05 (×2): 17 g via ORAL
  Filled 2023-12-04 (×3): qty 1

## 2023-12-04 MED ORDER — ZOLPIDEM TARTRATE 5 MG PO TABS
5.0000 mg | ORAL_TABLET | Freq: Every evening | ORAL | Status: DC | PRN
  Administered 2023-12-04 – 2023-12-05 (×2): 5 mg via ORAL
  Filled 2023-12-04 (×2): qty 1

## 2023-12-04 MED ORDER — POTASSIUM CHLORIDE CRYS ER 20 MEQ PO TBCR
40.0000 meq | EXTENDED_RELEASE_TABLET | Freq: Two times a day (BID) | ORAL | Status: AC
  Administered 2023-12-04 (×2): 40 meq via ORAL
  Filled 2023-12-04 (×2): qty 2

## 2023-12-04 MED ORDER — OXYCODONE-ACETAMINOPHEN 5-325 MG PO TABS
1.0000 | ORAL_TABLET | ORAL | Status: DC | PRN
  Administered 2023-12-04 – 2023-12-06 (×10): 1 via ORAL
  Filled 2023-12-04 (×11): qty 1

## 2023-12-04 MED ORDER — OXYCODONE HCL 5 MG PO TABS
5.0000 mg | ORAL_TABLET | ORAL | Status: DC | PRN
  Administered 2023-12-04 – 2023-12-06 (×12): 5 mg via ORAL
  Filled 2023-12-04 (×11): qty 1

## 2023-12-04 MED ORDER — METHYLPREDNISOLONE SODIUM SUCC 125 MG IJ SOLR
60.0000 mg | Freq: Two times a day (BID) | INTRAMUSCULAR | Status: DC
  Administered 2023-12-04 – 2023-12-05 (×2): 60 mg via INTRAVENOUS
  Filled 2023-12-04 (×2): qty 2

## 2023-12-04 NOTE — Plan of Care (Signed)

## 2023-12-04 NOTE — Evaluation (Signed)
 Physical Therapy Evaluation Patient Details Name: [name] MRN: [id number] DOB: May 21, 1956 Today's Date: 12/04/2023  History of Present Illness  68 yo female admitted with acute on chronic resp failure. Hx of COPD-wears O2 at baseline PRN, colitis, chronic pain, sciatica, L THA  Clinical Impression  Patient was in BR when PT checked on-ambulated independently, O2 on as noted tubing under door.  PT returned, patient  reports that she had her  bath, had wiped down surfaces in  her room and washed and hung up her pants in the BR. Patient is independent and does not require further skilled PT. Patient  on 4 L, SPo2 95%.         If plan is discharge home, recommend the following: Assist for transportation   Can travel by private vehicle        Equipment Recommendations None recommended by PT  Recommendations for Other Services       Functional Status Assessment Patient has not had a recent decline in their functional status     Precautions / Restrictions Precautions Precaution/Restrictions Comments: on 4 L Restrictions Weight Bearing Restrictions Per Provider Order: No      Mobility  Bed Mobility Overal bed mobility: Independent                  Transfers Overall transfer level: Independent                      Ambulation/Gait Ambulation/Gait assistance: Independent   Assistive device: None Gait Pattern/deviations: WFL(Within Functional Limits)       General Gait Details: ad lib in room.  Stairs            Wheelchair Mobility     Tilt Bed    Modified Rankin (Stroke Patients Only)       Balance Overall balance assessment: Independent                                           Pertinent Vitals/Pain Pain Assessment Pain Assessment: No/denies pain    Home Living Family/patient expects to be discharged to:: Private residence Living Arrangements: Alone Available Help at Discharge: Family;Available  PRN/intermittently Type of Home: Apartment Home Access: Level entry       Home Layout: One level Home Equipment: Cane - single point      Prior Function Prior Level of Function : Independent/Modified Independent;Driving             Mobility Comments: Independent ADLs Comments: Indep, gets groceries     Extremity/Trunk Assessment   Upper Extremity Assessment Upper Extremity Assessment: Overall WFL for tasks assessed    Lower Extremity Assessment Lower Extremity Assessment: Overall WFL for tasks assessed    Cervical / Trunk Assessment Cervical / Trunk Assessment: Normal  Communication   Communication Communication: No apparent difficulties    Cognition Arousal: Alert Behavior During Therapy: Impulsive, WFL for tasks assessed/performed   PT - Cognitive impairments: No apparent impairments                         Following commands: Intact       Cueing       General Comments      Exercises     Assessment/Plan    PT Assessment Patient does not need any further PT services  PT Problem List  PT Treatment Interventions      PT Goals (Current goals can be found in the Care Plan section)  Acute Rehab PT Goals Patient Stated Goal: go home PT Goal Formulation: All assessment and education complete, DC therapy    Frequency       Co-evaluation               AM-PAC PT 6 Clicks Mobility  Outcome Measure Help needed turning from your back to your side while in a flat bed without using bedrails?: None Help needed moving from lying on your back to sitting on the side of a flat bed without using bedrails?: None Help needed moving to and from a bed to a chair (including a wheelchair)?: None Help needed standing up from a chair using your arms (e.g., wheelchair or bedside chair)?: None Help needed to walk in hospital room?: None Help needed climbing 3-5 steps with a railing? : None 6 Click Score: 24    End of Session Equipment  Utilized During Treatment: Oxygen Activity Tolerance: Patient tolerated treatment well Patient left: in bed;with nursing/sitter in room Nurse Communication: Mobility status PT Visit Diagnosis: Difficulty in walking, not elsewhere classified (R26.2)    Time: 8452-8440 PT Time Calculation (min) (ACUTE ONLY): 12 min   Charges:   PT Evaluation $PT Eval Low Complexity: 1 Low   PT General Charges $$ ACUTE PT VISIT: 1 Visit         [name] PT Acute Rehabilitation Services Office [phone]   Arnold Regina Norris 12/04/2023, 4:33 PM

## 2023-12-04 NOTE — Plan of Care (Signed)
   Problem: Clinical Measurements: Goal: Respiratory complications will improve Outcome: Progressing Goal: Cardiovascular complication will be avoided Outcome: Progressing   Problem: Activity: Goal: Risk for activity intolerance will decrease Outcome: Progressing

## 2023-12-04 NOTE — TOC Initial Note (Signed)
 Transition of Care [facility]) - Initial/Assessment Note    Patient Details  Name: [name] MRN: [id number] Date of Birth: [birth year]  [facility]) CM/SW Contact:    Sheri ONEIDA Sharps, LCSW Phone Number: 12/04/2023, 3:16 PM  Clinical Narrative:                 Pt from hoem alone. Pt currently on 4L of O2. Pt has O2 at home through [employer]. Pt continues medical workup. TOC following for DC needs.     Barriers to Discharge: Continued Medical Work up   Patient Goals and CMS Choice Patient states their goals for this hospitalization and ongoing recovery are:: return home   Choice offered to / list presented to : NA      Expected Discharge Plan and Services In-house Referral: NA Discharge Planning Services: NA   Living arrangements for the past 2 months: Apartment                 DME Arranged: N/A DME Agency: NA       HH Arranged: NA HH Agency: NA        Prior Living Arrangements/Services Living arrangements for the past 2 months: Apartment Lives with:: Self Patient language and need for interpreter reviewed:: Yes Do you feel safe going back to the place where you live?: Yes      Need for Family Participation in Patient Care: Yes (Comment) Care giver support system in place?: Yes (comment) Current home services: DME Criminal Activity/Legal Involvement Pertinent to Current Situation/Hospitalization: No - Comment as needed  Activities of Daily Living   ADL Screening (condition at time of admission) Independently performs ADLs?: Yes (appropriate for developmental age) Is the patient deaf or have difficulty hearing?: No Does the patient have difficulty seeing, even when wearing glasses/contacts?: No Does the patient have difficulty concentrating, remembering, or making decisions?: No  Permission Sought/Granted                  Emotional Assessment Appearance:: Appears stated age Attitude/Demeanor/Rapport: Engaged Affect (typically observed):  Accepting Orientation: : Oriented to Self, Oriented to Place, Oriented to  Time, Oriented to Situation Alcohol / Substance Use: Not Applicable Psych Involvement: No (comment)  Admission diagnosis:  Hypercapnia [R06.89] Acute exacerbation of chronic obstructive pulmonary disease (COPD) (HCC) [J44.1] COPD exacerbation (HCC) [J44.1] Chest pain, unspecified type [R07.9] Patient Active Problem List   Diagnosis Date Noted   Chronic hypoxic respiratory failure (HCC) 12/02/2023   Continuous dependence on cigarette smoking 12/02/2023   Generalized anxiety disorder 12/02/2023   Acute exacerbation of chronic obstructive pulmonary disease (COPD) (HCC) 12/02/2023   Acute respiratory failure with hypoxia (HCC) 10/11/2023   Rectal bleeding 10/11/2023   Acute on chronic hypoxic respiratory failure (HCC) 09/06/2023   Wheezing 05/24/2023   COPD exacerbation (HCC) 05/22/2023   Precordial chest pain 03/31/2023   Centrilobular emphysema (HCC) 12/13/2022   Palliative care by specialist 10/10/2022   Goals of care, counseling/discussion 10/10/2022   Vocal cord dysfunction 10/08/2022   Anxiety 10/08/2022   Protein-calorie malnutrition, severe 10/04/2022   COPD with acute exacerbation (HCC) 10/02/2022   Grief 06/03/2022   Pain of left upper extremity 12/28/2016   Elevated TSH 11/08/2016   Gastroesophageal reflux disease 11/08/2016   Constipation 11/08/2016   COPD (chronic obstructive pulmonary disease) (HCC) 11/08/2016   Tobacco use 11/08/2016   Non-cardiac chest pain 11/07/2016   Unstable angina (HCC) 01/07/2016   Essential hypertension 01/07/2016   Hypokalemia 11/06/2013   Acute blood loss  anemia 11/06/2013   Osteoarthritis of left hip 11/05/2013   History of total left hip replacement 11/05/2013   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 [address] [place] [address] Phone: [phone] Fax: [phone]  [place] [facility] [address] [place] [address] Phone: [phone] Fax: [phone]     Social Drivers of Health (SDOH) Social History: SDOH Screenings   Food Insecurity: No Food Insecurity (12/03/2023)  Housing: Low Risk  (12/03/2023)  Transportation Needs: No Transportation Needs (12/03/2023)  Utilities: Not At Risk (12/03/2023)  Depression (PHQ2-9): Low Risk  (10/05/2020)  Financial Resource Strain: Low Risk  (10/05/2020)  Social Connections: Socially Isolated (12/03/2023)  Stress: No Stress Concern Present (10/05/2020)  Tobacco Use: High Risk (12/02/2023)   SDOH Interventions:     Readmission Risk Interventions    12/04/2023    3:15 PM 10/04/2022    9:50 AM  Readmission Risk Prevention Plan  Post Dischage Appt  Complete  Medication Screening  Complete  Transportation Screening Complete Complete  PCP or Specialist Appt within 3-5 Days Complete   HRI or Home Care Consult Complete   Social Work Consult for Recovery Care Planning/Counseling Complete   Palliative Care Screening Not Applicable   Medication Review Oceanographer) Complete

## 2023-12-04 NOTE — Progress Notes (Signed)
 [facility]                                                                               [name], is a 68 y.o. female, DOB - 1956-03-17, FMW:[id number] Admit date - 12/02/2023    Outpatient Primary MD for the patient is [name], [name], NP  LOS - 1  days    Brief summary   [name] is a 68 y.o. female with medical history significant of chronic hypoxic respiratory failure, COPD, essential hypertension, ulcerative colitis, chronic pain syndrome, sciatica, actively smoking cigarette, hyperlipidemia and chronic noncardiac chest pains presented to emergency department complaining of shortness of breath and bilateral lower extremity edema.    EMS found O2 sat 80% and EMS gave albuterol  nebulizer, Solu-Medrol  and mag sulfate.   Patient reported she continues to smoking cigarettes and use albuterol  nebulizer as needed for wheezing shortness of breath.  Patient is not any long-acting inhaler at home.   Patient is also endorsing mid central chest pain for last few months which has not been going away.  Physical exam revealed chest pain reproducible with palpation.   Patient was admitted for acute COPD exacerbation  Assessment & Plan:   Principal Problem:   COPD with acute exacerbation (HCC) Active Problems:   Acute on chronic hypoxic respiratory failure (HCC)   Chronic hypoxic respiratory failure (HCC)   Continuous dependence on cigarette smoking   Essential hypertension   Non-cardiac chest pain   Generalized anxiety disorder   Acute exacerbation of chronic obstructive pulmonary disease (COPD) (HCC)   Acute on chronic hypoxic respiratory failure COPD with acute exacerbation  -The setting of chronic respiratory failure with hypoxia on 4 L of oxygen at home at baseline -Chest x-ray unremarkable.  Continue with IV Solu-Medrol  60 mg every 12 hours for another 24 hours and transition to prednisone  Continue with DuoNebs  Chronic tobacco abuse - Advised  smoking cessation  Continue with nicotine  patch    Noncardiac chest pain/chronic consistent with musculoskeletal pain No ischemic changes on EKG and troponins within normal range Pain control     Essential hypertension Currently not on any medications, continue to monitor   Generalized anxiety disorder -Continue Atarax  as needed and Remeron .   Insomnia Patient requesting Ambien  to be restarted   Chronic pain syndrome Sciatica - Continuing  regimen as needed oxycodone /acetaminophen      Estimated body mass index is 18.74 kg/m as calculated from the following:   Height as of this encounter: 5' 1 (1.549 m).   Weight as of this encounter: 45 kg.  Code Status: Full code DVT Prophylaxis:  enoxaparin  (LOVENOX ) injection 40 mg Start: 12/03/23 1000 SCDs Start: 12/02/23 2336 Place TED hose Start: 12/02/23 2336   Level of Care: Level of care: Telemetry Family Communication: None at bedside  Disposition Plan:     Remains inpatient appropriate: Pending clinical improvement  Procedures:  None  Consultants:   None  Antimicrobials:   Anti-infectives (From admission, onward)    Start     Dose/Rate Route Frequency Ordered Stop   12/03/23 2200  azithromycin  (ZITHROMAX ) tablet 500 mg        500 mg Oral Daily  12/03/23 1054     12/02/23 2345  azithromycin  (ZITHROMAX ) 500 mg in sodium chloride  0.9 % 250 mL IVPB  Status:  Discontinued        500 mg 250 mL/hr over 60 Minutes Intravenous Every 24 hours 12/02/23 2335 12/03/23 1054        Medications  Scheduled Meds:  atorvastatin   40 mg Oral Daily   azithromycin   500 mg Oral Daily   enoxaparin  (LOVENOX ) injection  40 mg Subcutaneous Q24H   guaiFENesin   10 mL Oral Q8H   ipratropium-albuterol   3 mL Nebulization BID   methylPREDNISolone  (SOLU-MEDROL ) injection  60 mg Intravenous Q12H   mirtazapine   7.5 mg Oral QHS   nicotine   14 mg Transdermal Daily   polyethylene glycol  17 g Oral Daily   potassium chloride   40 mEq Oral  BID   sodium chloride  flush  3 mL Intravenous Q12H   sodium chloride  flush  3 mL Intravenous Q12H   Continuous Infusions: PRN Meds:.acetaminophen  **OR** acetaminophen , albuterol , alum & mag hydroxide-simeth, benzonatate , hydrOXYzine , ondansetron  **OR** ondansetron  (ZOFRAN ) IV, oxyCODONE -acetaminophen  **AND** oxyCODONE , sodium chloride  flush, zolpidem     Subjective:   [name] was seen and examined today.  Pain not well controlled. Breathing has improved. Anxious.  Requesting sleep medication.  Objective:   Vitals:   12/03/23 1913 12/03/23 2329 12/04/23 0418 12/04/23 1208  BP: 111/79  106/66 (!) 149/103  Pulse: 67  82 69  Resp: 15  15 18   Temp: 98.6 F (37 C)  98.2 F (36.8 C) 98.9 F (37.2 C)  TempSrc:    Oral  SpO2: 98% 97% 96% 100%  Weight:      Height:        Intake/Output Summary (Last 24 hours) at 12/04/2023 1441 Last data filed at 12/04/2023 1300 Gross per 24 hour  Intake 170 ml  Output --  Net 170 ml   Filed Weights   12/03/23 1540  Weight: 45 kg     Exam General: Alert and oriented x 3, NAD Cardiovascular: S1 S2 auscultated, no murmurs, RRR Respiratory: Clear to auscultation bilaterally, no wheezing, rales or rhonchi Gastrointestinal: Soft, nontender, nondistended, + bowel sounds Ext: no pedal edema bilaterally Neuro: AAOx3, Cr N's II- XII. Strength 5/5 upper and lower extremities bilaterally Skin: No rashes Psych: Normal affect and demeanor, alert and oriented x3    Data Reviewed:  I have personally reviewed following labs and imaging studies   CBC Lab Results  Component Value Date   WBC 4.4 12/03/2023   RBC 3.67 (L) 12/03/2023   HGB 10.9 (L) 12/03/2023   HCT 35.1 (L) 12/03/2023   MCV 95.6 12/03/2023   MCH 29.7 12/03/2023   PLT 249 12/03/2023   MCHC 31.1 12/03/2023   RDW 14.3 12/03/2023   LYMPHSABS 3.5 10/11/2023   MONOABS 0.5 10/11/2023   EOSABS 0.2 10/11/2023   BASOSABS 0.0 10/11/2023     Last metabolic panel Lab Results   Component Value Date   NA 133 (L) 12/03/2023   K 3.2 (L) 12/03/2023   CL 96 (L) 12/03/2023   CO2 29 12/03/2023   BUN 9 12/03/2023   CREATININE 0.54 12/03/2023   GLUCOSE 155 (H) 12/03/2023   GFRNONAA >60 12/03/2023   GFRAA >60 11/20/2019   CALCIUM  8.2 (L) 12/03/2023   PHOS 2.3 (L) 05/25/2023   PROT 6.5 10/11/2023   ALBUMIN 4.0 10/11/2023   BILITOT 0.6 10/11/2023   ALKPHOS 41 10/11/2023   AST 17 10/11/2023   ALT 9 10/11/2023   ANIONGAP  8 12/03/2023    CBG (last 3)  Recent Labs    12/03/23 0050  GLUCAP 158*      Coagulation Profile: No results for input(s): INR, PROTIME in the last 168 hours.   Radiology Studies: DG Chest Port 1 View Result Date: 12/02/2023 CLINICAL DATA:  Shortness of breath.  Low oxygen saturation. EXAM: PORTABLE CHEST 1 VIEW COMPARISON:  10/11/2023 FINDINGS: Heart size and pulmonary vascularity are normal. Lungs are clear and expanded. No pleural effusion or pneumothorax. Mediastinal contours appear intact. Calcification of the aorta. Degenerative changes in the spine and shoulders. IMPRESSION: No active disease. Electronically Signed   By: Elsie Gravely M.D.   On: 12/02/2023 22:38       Elgie Butter M.D. [facility] 12/04/2023, 2:41 PM  Available via Epic secure chat 7am-7pm After 7 pm, please refer to night coverage provider listed on amion.

## 2023-12-05 DIAGNOSIS — J9621 Acute and chronic respiratory failure with hypoxia: Secondary | ICD-10-CM | POA: Diagnosis not present

## 2023-12-05 DIAGNOSIS — J441 Chronic obstructive pulmonary disease with (acute) exacerbation: Secondary | ICD-10-CM | POA: Diagnosis not present

## 2023-12-05 DIAGNOSIS — J9611 Chronic respiratory failure with hypoxia: Secondary | ICD-10-CM | POA: Diagnosis not present

## 2023-12-05 DIAGNOSIS — F17210 Nicotine dependence, cigarettes, uncomplicated: Secondary | ICD-10-CM | POA: Diagnosis not present

## 2023-12-05 LAB — BASIC METABOLIC PANEL WITH GFR
Anion gap: 9 (ref 5–15)
BUN: 16 mg/dL (ref 8–23)
CO2: 31 mmol/L (ref 22–32)
Calcium: 9.2 mg/dL (ref 8.9–10.3)
Chloride: 99 mmol/L (ref 98–111)
Creatinine, Ser: 0.74 mg/dL (ref 0.44–1.00)
GFR, Estimated: >60 mL/min (ref >60)
Glucose, Bld: 168 mg/dL — ABNORMAL HIGH (ref 70–99)
Potassium: 4.8 mmol/L (ref 3.5–5.1)
Sodium: 139 mmol/L (ref 135–145)

## 2023-12-05 MED ORDER — IPRATROPIUM-ALBUTEROL 0.5-2.5 (3) MG/3ML IN SOLN
3.0000 mL | Freq: Three times a day (TID) | RESPIRATORY_TRACT | Status: DC
  Administered 2023-12-06: 3 mL via RESPIRATORY_TRACT
  Filled 2023-12-05 (×2): qty 3

## 2023-12-05 MED ORDER — PREDNISONE 20 MG PO TABS
40.0000 mg | ORAL_TABLET | Freq: Every day | ORAL | Status: DC
  Administered 2023-12-06: 40 mg via ORAL
  Filled 2023-12-05: qty 2

## 2023-12-05 NOTE — TOC Progression Note (Signed)
 Transition of Care [facility]) - Progression Note    Patient Details  Name: [name] MRN: [id number] Date of Birth: Dec 16, 1955  Transition of Care [facility]) CM/SW Contact  Sheri ONEIDA Sharps, [place] Phone Number: 12/05/2023, 1:26 PM  Clinical Narrative:    Pt active with [facility] PT/OT. Will need ROC at dc.     Barriers to Discharge: Continued Medical Work up  Expected Discharge Plan and Services In-house Referral: NA Discharge Planning Services: NA   Living arrangements for the past 2 months: Apartment                 DME Arranged: N/A DME Agency: NA       HH Arranged: NA HH Agency: NA         Social Determinants of Health (SDOH) Interventions SDOH Screenings   Food Insecurity: No Food Insecurity (12/03/2023)  Housing: Low Risk  (12/03/2023)  Transportation Needs: No Transportation Needs (12/03/2023)  Utilities: Not At Risk (12/03/2023)  Depression (PHQ2-9): Low Risk  (10/05/2020)  Financial Resource Strain: Low Risk  (10/05/2020)  Social Connections: Socially Isolated (12/03/2023)  Stress: No Stress Concern Present (10/05/2020)  Tobacco Use: High Risk (12/02/2023)    Readmission Risk Interventions    12/04/2023    3:15 PM 10/04/2022    9:50 AM  Readmission Risk Prevention Plan  Post Dischage Appt  Complete  Medication Screening  Complete  Transportation Screening Complete Complete  PCP or Specialist Appt within 3-5 Days Complete   HRI or Home Care Consult Complete   Social Work Consult for Recovery Care Planning/Counseling Complete   Palliative Care Screening Not Applicable   Medication Review Oceanographer) Complete

## 2023-12-05 NOTE — Plan of Care (Signed)
  Problem: Clinical Measurements: Goal: Respiratory complications will improve Outcome: Progressing   Problem: Nutrition: Goal: Adequate nutrition will be maintained Outcome: Progressing   Problem: Pain Managment: Goal: General experience of comfort will improve and/or be controlled Outcome: Progressing   Problem: Safety: Goal: Ability to remain free from injury will improve Outcome: Progressing

## 2023-12-05 NOTE — Plan of Care (Signed)
  Problem: Education: Goal: Knowledge of General Education information will improve Description: Including pain rating scale, medication(s)/side effects and non-pharmacologic comfort measures Outcome: Progressing   Problem: Health Behavior/Discharge Planning: Goal: Ability to manage health-related needs will improve Outcome: Progressing   Problem: Clinical Measurements: Goal: Ability to maintain clinical measurements within normal limits will improve Outcome: Progressing Goal: Will remain free from infection Outcome: Progressing Goal: Diagnostic test results will improve Outcome: Progressing Goal: Respiratory complications will improve Outcome: Progressing Goal: Cardiovascular complication will be avoided Outcome: Progressing   Problem: Nutrition: Goal: Adequate nutrition will be maintained Outcome: Progressing   Problem: Elimination: Goal: Will not experience complications related to bowel motility Outcome: Progressing Goal: Will not experience complications related to urinary retention Outcome: Progressing   Problem: Pain Managment: Goal: General experience of comfort will improve and/or be controlled Outcome: Progressing   Problem: Safety: Goal: Ability to remain free from injury will improve Outcome: Progressing   Problem: Skin Integrity: Goal: Risk for impaired skin integrity will decrease Outcome: Progressing   Problem: Education: Goal: Knowledge of disease or condition will improve Outcome: Progressing Goal: Knowledge of the prescribed therapeutic regimen will improve Outcome: Progressing Goal: Individualized Educational Video(s) Outcome: Progressing   Problem: Activity: Goal: Will verbalize the importance of balancing activity with adequate rest periods Outcome: Progressing   Problem: Respiratory: Goal: Ability to maintain a clear airway will improve Outcome: Progressing Goal: Ability to maintain adequate ventilation will improve Outcome:  Progressing   Problem: Activity: Goal: Risk for activity intolerance will decrease Outcome: Not Progressing   Problem: Coping: Goal: Level of anxiety will decrease Outcome: Not Progressing   Problem: Activity: Goal: Ability to tolerate increased activity will improve Outcome: Not Progressing   Problem: Respiratory: Goal: Levels of oxygenation will improve Outcome: Not Progressing

## 2023-12-05 NOTE — Progress Notes (Signed)
 [facility]                                                                               [name], is a 68 y.o. female, DOB - 10-28-1955, FMW:[id number] Admit date - 12/02/2023    Outpatient Primary MD for the patient is [name], [name], NP  LOS - 2  days    Brief summary   [name] is a 68 y.o. female with medical history significant of chronic hypoxic respiratory failure, COPD, essential hypertension, ulcerative colitis, chronic pain syndrome, sciatica, actively smoking cigarette, hyperlipidemia and chronic noncardiac chest pains presented to emergency department complaining of shortness of breath and bilateral lower extremity edema.    EMS found O2 sat 80% and EMS gave albuterol  nebulizer, Solu-Medrol  and mag sulfate.   Patient reported she continues to smoking cigarettes and use albuterol  nebulizer as needed for wheezing shortness of breath.  Patient is not any long-acting inhaler at home.   Patient is also endorsing mid central chest pain for last few months which has not been going away.  Physical exam revealed chest pain reproducible with palpation.   Patient was admitted for acute COPD exacerbation  Assessment & Plan:   Principal Problem:   COPD with acute exacerbation (HCC) Active Problems:   Acute on chronic hypoxic respiratory failure (HCC)   Chronic hypoxic respiratory failure (HCC)   Continuous dependence on cigarette smoking   Essential hypertension   Non-cardiac chest pain   Generalized anxiety disorder   Acute exacerbation of chronic obstructive pulmonary disease (COPD) (HCC)   Acute on chronic hypoxic respiratory failure COPD with acute exacerbation In the setting of chronic respiratory failure with hypoxia on 4 L of oxygen at home at baseline Chest x-ray unremarkable.  She was started on IV Solu-Medrol  60 mg every 12 hours with significant improvement in her breathing and wheezing, plan to  transition to prednisone  today and  possible d/c in am.  Continue with DuoNebs  Chronic tobacco abuse - Advised smoking cessation  Continue with nicotine  patch    Noncardiac chest pain/chronic consistent with musculoskeletal pain No ischemic changes on EKG and troponins within normal range Pain control     Essential hypertension Currently not on any medications, continue to monitor   Generalized anxiety disorder -Continue Atarax  as needed and Remeron .   Insomnia Patient requesting Ambien  to be restarted   Chronic pain syndrome Sciatica - Continuing  regimen as needed oxycodone /acetaminophen   Hypokalemia Replaced. Repeat BMP today.     Hyponatremia Recheck BMP today.    Estimated body mass index is 18.74 kg/m as calculated from the following:   Height as of this encounter: 5' 1 (1.549 m).   Weight as of this encounter: 45 kg.  Code Status: Full code DVT Prophylaxis:  enoxaparin  (LOVENOX ) injection 40 mg Start: 12/03/23 1000 SCDs Start: 12/02/23 2336 Place TED hose Start: 12/02/23 2336   Level of Care: Level of care: Telemetry Family Communication: None at bedside  Disposition Plan:     Remains inpatient appropriate: d/c in am.   Procedures:  None  Consultants:   None  Antimicrobials:   Anti-infectives (From admission, onward)    Start  Dose/Rate Route Frequency Ordered Stop   12/03/23 2200  azithromycin  (ZITHROMAX ) tablet 500 mg        500 mg Oral Daily 12/03/23 1054     12/02/23 2345  azithromycin  (ZITHROMAX ) 500 mg in sodium chloride  0.9 % 250 mL IVPB  Status:  Discontinued        500 mg 250 mL/hr over 60 Minutes Intravenous Every 24 hours 12/02/23 2335 12/03/23 1054        Medications  Scheduled Meds:  atorvastatin   40 mg Oral Daily   azithromycin   500 mg Oral Daily   enoxaparin  (LOVENOX ) injection  40 mg Subcutaneous Q24H   guaiFENesin   10 mL Oral Q8H   ipratropium-albuterol   3 mL Nebulization BID   mirtazapine   7.5 mg Oral QHS   nicotine   14 mg Transdermal Daily    polyethylene glycol  17 g Oral Daily   [START ON 12/06/2023] predniSONE   40 mg Oral QAC breakfast   sodium chloride  flush  3 mL Intravenous Q12H   sodium chloride  flush  3 mL Intravenous Q12H   Continuous Infusions: PRN Meds:.acetaminophen  **OR** acetaminophen , albuterol , alum & mag hydroxide-simeth, benzonatate , hydrOXYzine , ondansetron  **OR** ondansetron  (ZOFRAN ) IV, oxyCODONE -acetaminophen  **AND** oxyCODONE , sodium chloride  flush, zolpidem     Subjective:   [name] was seen and examined today.  Breathing and wheezing has improved. Pt requesting to be discharged tomorrow.  Objective:   Vitals:   12/04/23 2103 12/05/23 0355 12/05/23 0730 12/05/23 1116  BP: 101/69 103/69  131/74  Pulse: 88 (!) 57  67  Resp: 20 20  16   Temp: 98.2 F (36.8 C) 98.5 F (36.9 C)  98.2 F (36.8 C)  TempSrc: Oral Oral    SpO2: 100% 100% (!) 4% 100%  Weight:      Height:        Intake/Output Summary (Last 24 hours) at 12/05/2023 1420 Last data filed at 12/05/2023 0900 Gross per 24 hour  Intake 120 ml  Output --  Net 120 ml   Filed Weights   12/03/23 1540  Weight: 45 kg     Exam General exam: Appears calm and comfortable  Respiratory system: wheezing has improved, air entry fair. On 4 lit of [place] oxygen.  Cardiovascular system: S1 & S2 heard, RRR. No JVD, murmurs, Gastrointestinal system: Abdomen is nondistended, soft and nontender.  Central nervous system: Alert and oriented. No focal neurological deficits. Extremities: Symmetric 5 x 5 power. Skin: No rashes,  Psychiatry: mood is appropriate.    Data Reviewed:  I have personally reviewed following labs and imaging studies   CBC Lab Results  Component Value Date   WBC 4.4 12/03/2023   RBC 3.67 (L) 12/03/2023   HGB 10.9 (L) 12/03/2023   HCT 35.1 (L) 12/03/2023   MCV 95.6 12/03/2023   MCH 29.7 12/03/2023   PLT 249 12/03/2023   MCHC 31.1 12/03/2023   RDW 14.3 12/03/2023   LYMPHSABS 3.5 10/11/2023   MONOABS 0.5 10/11/2023    EOSABS 0.2 10/11/2023   BASOSABS 0.0 10/11/2023     Last metabolic panel Lab Results  Component Value Date   NA 133 (L) 12/03/2023   K 3.2 (L) 12/03/2023   CL 96 (L) 12/03/2023   CO2 29 12/03/2023   BUN 9 12/03/2023   CREATININE 0.54 12/03/2023   GLUCOSE 155 (H) 12/03/2023   GFRNONAA >60 12/03/2023   GFRAA >60 11/20/2019   CALCIUM  8.2 (L) 12/03/2023   PHOS 2.3 (L) 05/25/2023   PROT 6.5 10/11/2023   ALBUMIN 4.0 10/11/2023  BILITOT 0.6 10/11/2023   ALKPHOS 41 10/11/2023   AST 17 10/11/2023   ALT 9 10/11/2023   ANIONGAP 8 12/03/2023    CBG (last 3)  Recent Labs    12/03/23 0050  GLUCAP 158*      Coagulation Profile: No results for input(s): INR, PROTIME in the last 168 hours.   Radiology Studies: No results found.      Elgie Butter M.D. [facility] 12/05/2023, 2:20 PM  Available via Epic secure chat 7am-7pm After 7 pm, please refer to night coverage provider listed on amion.

## 2023-12-05 NOTE — Plan of Care (Signed)
   Problem: Education: Goal: Knowledge of General Education information will improve Description: Including pain rating scale, medication(s)/side effects and non-pharmacologic comfort measures Outcome: Progressing   Problem: Health Behavior/Discharge Planning: Goal: Ability to manage health-related needs will improve Outcome: Progressing   Problem: Clinical Measurements: Goal: Will remain free from infection Outcome: Progressing

## 2023-12-06 ENCOUNTER — Other Ambulatory Visit (HOSPITAL_COMMUNITY): Payer: Self-pay

## 2023-12-06 DIAGNOSIS — J9621 Acute and chronic respiratory failure with hypoxia: Secondary | ICD-10-CM | POA: Diagnosis not present

## 2023-12-06 DIAGNOSIS — J9611 Chronic respiratory failure with hypoxia: Secondary | ICD-10-CM | POA: Diagnosis not present

## 2023-12-06 DIAGNOSIS — F411 Generalized anxiety disorder: Secondary | ICD-10-CM | POA: Diagnosis not present

## 2023-12-06 DIAGNOSIS — J441 Chronic obstructive pulmonary disease with (acute) exacerbation: Secondary | ICD-10-CM | POA: Diagnosis not present

## 2023-12-06 MED ORDER — BENZONATATE 100 MG PO CAPS
100.0000 mg | ORAL_CAPSULE | Freq: Three times a day (TID) | ORAL | 0 refills | Status: DC | PRN
  Filled 2023-12-06: qty 20, 7d supply, fill #0

## 2023-12-06 MED ORDER — FLUTICASONE-SALMETEROL 250-50 MCG/ACT IN AEPB
1.0000 | INHALATION_SPRAY | Freq: Two times a day (BID) | RESPIRATORY_TRACT | 0 refills | Status: DC
  Filled 2023-12-06: qty 60, 30d supply, fill #0

## 2023-12-06 MED ORDER — NICOTINE 14 MG/24HR TD PT24
14.0000 mg | MEDICATED_PATCH | Freq: Every day | TRANSDERMAL | 0 refills | Status: DC
Start: 2023-12-07 — End: 2024-01-02
  Filled 2023-12-06 (×2): qty 28, 28d supply, fill #0

## 2023-12-06 MED ORDER — IPRATROPIUM-ALBUTEROL 0.5-2.5 (3) MG/3ML IN SOLN
3.0000 mL | Freq: Four times a day (QID) | RESPIRATORY_TRACT | 2 refills | Status: DC | PRN
  Filled 2023-12-06: qty 90, 8d supply, fill #0

## 2023-12-06 MED ORDER — AZITHROMYCIN 250 MG PO TABS
500.0000 mg | ORAL_TABLET | Freq: Every day | ORAL | 0 refills | Status: DC
  Filled 2023-12-06: qty 6, 3d supply, fill #0

## 2023-12-06 MED ORDER — PREDNISONE 20 MG PO TABS
ORAL_TABLET | ORAL | 0 refills | Status: DC
  Filled 2023-12-06: qty 18, 12d supply, fill #0

## 2023-12-06 NOTE — Progress Notes (Signed)
 Discharge medications delivered to patient at bedside [facility]

## 2023-12-06 NOTE — Discharge Summary (Signed)
 Physician Discharge Summary   Patient: [name] MRN: [id number] DOB: March 17, 1956  Admit date:     12/02/2023  Discharge date: 12/06/23  Discharge Physician: Elgie Butter   PCP: [name] [name], NP   Recommendations at discharge:  Please follow up with PCP in one week.  Please follow up with Pulmonology in 1 to 2  weeks.   Discharge Diagnoses: Principal Problem:   COPD with acute exacerbation (HCC) Active Problems:   Acute on chronic hypoxic respiratory failure (HCC)   Chronic hypoxic respiratory failure (HCC)   Continuous dependence on cigarette smoking   Essential hypertension   Non-cardiac chest pain   Generalized anxiety disorder   Acute exacerbation of chronic obstructive pulmonary disease (COPD) [facility])    Hospital Course:   [name] is a 68 y.o. female with medical history significant of chronic hypoxic respiratory failure, COPD, essential hypertension, ulcerative colitis, chronic pain syndrome, sciatica, actively smoking cigarette, hyperlipidemia and chronic noncardiac chest pains presented to emergency department complaining of shortness of breath and bilateral lower extremity edema.     EMS found O2 sat 80% and EMS gave albuterol  nebulizer, Solu-Medrol  and mag sulfate.   Patient reported she continues to smoking cigarettes and use albuterol  nebulizer as needed for wheezing shortness of breath.  Patient is not any long-acting inhaler at home.   Patient is also endorsing mid central chest pain for last few months which has not been going away.  Physical exam revealed chest pain reproducible with palpation.   Patient was admitted for acute COPD exacerbation  Assessment and Plan:    Acute on chronic hypoxic respiratory failure COPD with acute exacerbation In the setting of chronic respiratory failure with hypoxia on 4 L of oxygen at home at baseline Chest x-ray unremarkable.  She was started on IV Solu-Medrol  60 mg every 12 hours with significant  improvement in her breathing and wheezing, transition to oral steroid taper on discharge.  Continue with DuoNebs   Chronic tobacco abuse - Advised smoking cessation  Continue with nicotine  patch     Noncardiac chest pain/chronic consistent with musculoskeletal pain No ischemic changes on EKG and troponins within normal range Pain control     Essential hypertension Currently not on any medications, continue to monitor   Generalized anxiety disorder -Continue home meds.    Insomnia Resume home meds.    Chronic pain syndrome Sciatica - Continuing  regimen as needed oxycodone /acetaminophen    Hypokalemia Replaced.      Hyponatremia Resolved.      Estimated body mass index is 18.74 kg/m as calculated from the following:   Height as of this encounter: 5' 1 (1.549 m).   Weight as of this encounter: 45 kg.    Consultants: none.  Procedures performed: none.   Disposition: Home Diet recommendation:  Discharge Diet Orders (From admission, onward)     Start     Ordered   12/06/23 0000  Diet - low sodium heart healthy        12/06/23 0948           Regular diet DISCHARGE MEDICATION: Allergies as of 12/06/2023   No Known Allergies      Medication List     STOP taking these medications    dextromethorphan -guaiFENesin  30-600 MG 12hr tablet Commonly known as: MUCINEX  DM       TAKE these medications    albuterol  (2.5 MG/3ML) 0.083% nebulizer solution Commonly known as: PROVENTIL  Take 2.5 mg by nebulization every 6 (six) hours as  needed for wheezing or shortness of breath.   albuterol  108 (90 Base) MCG/ACT inhaler Commonly known as: VENTOLIN  HFA Inhale 2 puffs into the lungs every 4 (four) hours as needed for wheezing or shortness of breath.   atorvastatin  10 MG tablet Commonly known as: LIPITOR Take 10 mg by mouth at bedtime.   azithromycin  250 MG tablet Commonly known as: ZITHROMAX  Take 2 tablets (500 mg total) by mouth daily.   benzonatate   100 MG capsule Commonly known as: TESSALON  Take 1 capsule (100 mg total) by mouth 3 (three) times daily as needed for cough.   enalapril -hydrochlorothiazide  10-25 MG tablet Commonly known as: VASERETIC  Take 1 tablet by mouth daily.   feeding supplement (OSMOLITE 1.2 CAL) Liqd Take 237 mLs by mouth 3 (three) times daily between meals.   fluticasone -salmeterol 250-50 MCG/ACT Aepb Commonly known as: Advair  Diskus Inhale 1 puff into the lungs in the morning and at bedtime.   hydrOXYzine  25 MG tablet Commonly known as: ATARAX  Take 25 mg by mouth 3 (three) times daily as needed for anxiety.   ipratropium-albuterol  0.5-2.5 (3) MG/3ML Soln Commonly known as: DUONEB Take 3 mLs by nebulization every 6 (six) hours as needed.   mirtazapine  7.5 MG tablet Commonly known as: REMERON  Take 7.5 mg by mouth at bedtime.   multivitamin capsule Take 1 capsule by mouth daily.   nicotine  14 mg/24hr patch Commonly known as: NICODERM CQ  - dosed in mg/24 hours Place 1 patch (14 mg total) onto the skin daily. Start taking on: December 07, 2023   oxyCODONE -acetaminophen  10-325 MG tablet Commonly known as: PERCOCET Take 1 tablet by mouth 4 (four) times daily.   OXYGEN Inhale 3 L/min into the lungs as needed (for shortness of breath).   polyethylene glycol 17 g packet Commonly known as: MIRALAX  / GLYCOLAX  Take 17 g by mouth daily as needed for mild constipation.   predniSONE  20 MG tablet Commonly known as: DELTASONE  Take 2 tablets by mouth daily for 3 days followed by  1 and 1/2 tablets by mouth daily for 3 days followed by  1 tablet daily by mouth daily for 3 days followed by  1/2 tablet by mouth daily for 3 days. Start taking on: December 07, 2023   PRESCRIPTION MEDICATION See admin instructions. BiPAP- At bedtime   zolpidem  5 MG tablet Commonly known as: AMBIEN  Take 5 mg by mouth at bedtime.        Follow-up Information     [name] [name], NP. Schedule an appointment as soon as  possible for a visit in 1 week(s).   Specialty: Nurse Practitioner Contact information: [address] [place] [address] [phone]         [facility] [place], [place] Follow up.   Contact information: [address] [place] [address] [phone]                Discharge Exam: Filed Weights   12/03/23 1540  Weight: 45 kg   General exam: Appears calm and comfortable  Respiratory system: Clear to auscultation. Respiratory effort normal. Cardiovascular system: S1 & S2 heard, RRR. No JVD,  Gastrointestinal system: Abdomen is nondistended, soft and nontender.  Central nervous system: Alert and oriented. Extremities: Symmetric 5 x 5 power. Skin: No rashes, Psychiatry: Mood & affect appropriate.    Condition at discharge: fair  The results of significant diagnostics from this hospitalization (including imaging, microbiology, ancillary and laboratory) are listed below for reference.   Imaging Studies:  DG Chest Port 1 View Result Date: 12/02/2023 CLINICAL DATA:  Shortness of breath.  Low oxygen saturation. EXAM: PORTABLE CHEST 1 VIEW COMPARISON:  10/11/2023 FINDINGS: Heart size and pulmonary vascularity are normal. Lungs are clear and expanded. No pleural effusion or pneumothorax. Mediastinal contours appear intact. Calcification of the aorta. Degenerative changes in the spine and shoulders. IMPRESSION: No active disease. Electronically Signed   By: Elsie Gravely M.D.   On: 12/02/2023 22:38    Microbiology: Results for orders placed or performed during the hospital encounter of 12/02/23  Resp panel by RT-PCR (RSV, Flu A&B, Covid) Anterior Nasal Swab     Status: None   Collection Time: 12/02/23 10:58 PM   Specimen: Anterior Nasal Swab  Result Value Ref Range Status   SARS Coronavirus 2 by RT PCR NEGATIVE NEGATIVE Final    Comment: (NOTE) SARS-CoV-2 target nucleic acids are NOT DETECTED.  The SARS-CoV-2 RNA is generally  detectable in upper respiratory specimens during the acute phase of infection. The lowest concentration of SARS-CoV-2 viral copies this assay can detect is 138 copies/mL. A negative result does not preclude SARS-Cov-2 infection and should not be used as the sole basis for treatment or other patient management decisions. A negative result may occur with  improper specimen collection/handling, submission of specimen other than nasopharyngeal swab, presence of viral mutation(s) within the areas targeted by this assay, and inadequate number of viral copies(<138 copies/mL). A negative result must be combined with clinical observations, patient history, and epidemiological information. The expected result is Negative.  Fact Sheet for Patients:  BloggerCourse.com  Fact Sheet for Healthcare Providers:  SeriousBroker.it  This test is no t yet approved or cleared by the [place]  FDA and  has been authorized for detection and/or diagnosis of SARS-CoV-2 by FDA under an Emergency Use Authorization (EUA). This EUA will remain  in effect (meaning this test can be used) for the duration of the COVID-19 declaration under Section 564(b)(1) of the Act, 21 U.S.C.section 360bbb-3(b)(1), unless the authorization is terminated  or revoked sooner.       Influenza A by PCR NEGATIVE NEGATIVE Final   Influenza B by PCR NEGATIVE NEGATIVE Final    Comment: (NOTE) The Xpert Xpress SARS-CoV-2/FLU/RSV plus assay is intended as an aid in the diagnosis of influenza from Nasopharyngeal swab specimens and should not be used as a sole basis for treatment. Nasal washings and aspirates are unacceptable for Xpert Xpress SARS-CoV-2/FLU/RSV testing.  Fact Sheet for Patients: BloggerCourse.com  Fact Sheet for Healthcare Providers: SeriousBroker.it  This test is not yet approved or cleared by the [place]  FDA  and has been authorized for detection and/or diagnosis of SARS-CoV-2 by FDA under an Emergency Use Authorization (EUA). This EUA will remain in effect (meaning this test can be used) for the duration of the COVID-19 declaration under Section 564(b)(1) of the Act, 21 U.S.C. section 360bbb-3(b)(1), unless the authorization is terminated or revoked.     Resp Syncytial Virus by PCR NEGATIVE NEGATIVE Final    Comment: (NOTE) Fact Sheet for Patients: BloggerCourse.com  Fact Sheet for Healthcare Providers: SeriousBroker.it  This test is not yet approved or cleared by the [place]  FDA and has been authorized for detection and/or diagnosis of SARS-CoV-2 by FDA under an Emergency Use Authorization (EUA). This EUA will remain in effect (meaning this test can be used) for the duration of the COVID-19 declaration under Section 564(b)(1) of the Act, 21 U.S.C. section 360bbb-3(b)(1), unless the authorization is terminated or revoked.  Performed  at [facility], [address], [place], [place] [address]   Respiratory (~20 pathogens) panel by PCR     Status: None   Collection Time: 12/02/23 10:58 PM   Specimen: Nasopharyngeal Swab; Respiratory  Result Value Ref Range Status   Adenovirus NOT DETECTED NOT DETECTED Final   Coronavirus 229E NOT DETECTED NOT DETECTED Final    Comment: (NOTE) The Coronavirus on the Respiratory Panel, DOES NOT test for the novel  Coronavirus (2019 nCoV)    Coronavirus HKU1 NOT DETECTED NOT DETECTED Final   Coronavirus NL63 NOT DETECTED NOT DETECTED Final   Coronavirus OC43 NOT DETECTED NOT DETECTED Final   Metapneumovirus NOT DETECTED NOT DETECTED Final   Rhinovirus / Enterovirus NOT DETECTED NOT DETECTED Final   Influenza A NOT DETECTED NOT DETECTED Final   Influenza B NOT DETECTED NOT DETECTED Final   Parainfluenza Virus 1 NOT DETECTED NOT DETECTED Final   Parainfluenza Virus 2 NOT  DETECTED NOT DETECTED Final   Parainfluenza Virus 3 NOT DETECTED NOT DETECTED Final   Parainfluenza Virus 4 NOT DETECTED NOT DETECTED Final   Respiratory Syncytial Virus NOT DETECTED NOT DETECTED Final   Bordetella pertussis NOT DETECTED NOT DETECTED Final   Bordetella Parapertussis NOT DETECTED NOT DETECTED Final   Chlamydophila pneumoniae NOT DETECTED NOT DETECTED Final   Mycoplasma pneumoniae NOT DETECTED NOT DETECTED Final    Comment: Performed at [facility], [address] [place], [place] [address]    Labs: CBC: Recent Labs  Lab 12/02/23 2209 12/03/23 0102  WBC 5.8 4.4  HGB 11.0* 10.9*  HCT 36.0 35.1*  MCV 95.5 95.6  PLT 283 249   Basic Metabolic Panel: Recent Labs  Lab 12/02/23 2209 12/03/23 0102 12/05/23 1448  NA 133* 133* 139  K 3.5 3.2* 4.8  CL 95* 96* 99  CO2 28 29 31   GLUCOSE 107* 155* 168*  BUN 9 9 16   CREATININE 0.65 0.54 0.74  CALCIUM  8.3* 8.2* 9.2   Liver Function Tests: No results for input(s): AST, ALT, ALKPHOS, BILITOT, PROT, ALBUMIN in the last 168 hours. CBG: Recent Labs  Lab 12/03/23 0050  GLUCAP 158*    Discharge time spent: 40 minutes.   Signed: Elgie Butter, MD [facility] 12/06/2023

## 2023-12-06 NOTE — Progress Notes (Signed)
 TOC meds in a secure bag delivered to pt in room by this RN- pt asked about nicotine  patches, if they were available- message sent to out patient pharmacy. Nicotine  patches will be filled at [facility] pharmacy. Pt verbally updated by this RN that it was too soon for duoneb refill- recently filled at [facility]- pt verbalized an understanding

## 2023-12-06 NOTE — TOC Transition Note (Signed)
 Transition of Care [facility]) - Discharge Note   Patient Details  Name: [name] MRN: [id number] Date of Birth: Dec 17, 1955  Transition of [facility]) CM/SW Contact:  Sonda Manuella Quill, RN Phone Number: 12/06/2023, 10:51 AM   Clinical Narrative:    D/C orders received; spoke w/ pt in room; pt says she does not have a travel tank; contacted Mitch Caine at [employer], and travel tank will be delivered to pt's room; also notified Jon at [facility] that pt has d/c orders; no TOC needs.   Final next level of care: Home w Home Health Services Barriers to Discharge: No Barriers Identified   Patient Goals and CMS Choice Patient states their goals for this hospitalization and ongoing recovery are:: return home   Choice offered to / list presented to : NA      Discharge Placement                    Patient and family notified of of transfer: 12/06/23  Discharge Plan and Services Additional resources added to the After Visit Summary for   In-house Referral: NA Discharge Planning Services: NA            DME Arranged: Oxygen DME Agency: NA Date DME Agency Contacted: 12/06/23 Time DME Agency Contacted: 1045 Representative spoke with at DME Agency: Thomasina Applebaum HH Arranged: PT, OT HH Agency: Other - See comment Damita) Date HH Agency Contacted: 12/06/23 Time HH Agency Contacted: 1047 Representative spoke with at [facility] Agency: Jon  Social Drivers of Health (SDOH) Interventions SDOH Screenings   Food Insecurity: No Food Insecurity (12/03/2023)  Housing: Low Risk  (12/03/2023)  Transportation Needs: No Transportation Needs (12/03/2023)  Utilities: Not At Risk (12/03/2023)  Depression (PHQ2-9): Low Risk  (10/05/2020)  Financial Resource Strain: Low Risk  (10/05/2020)  Social Connections: Socially Isolated (12/03/2023)  Stress: No Stress Concern Present (10/05/2020)  Tobacco Use: High Risk (12/02/2023)     Readmission Risk Interventions    12/04/2023    3:15 PM 10/04/2022    9:50 AM   Readmission Risk Prevention Plan  Post Dischage Appt  Complete  Medication Screening  Complete  Transportation Screening Complete Complete  PCP or Specialist Appt within 3-5 Days Complete   HRI or Home Care Consult Complete   Social Work Consult for Recovery Care Planning/Counseling Complete   Palliative Care Screening Not Applicable   Medication Review [employer]) Complete

## 2023-12-06 NOTE — Plan of Care (Signed)
  Problem: Education: Goal: Knowledge of General Education information will improve Description: Including pain rating scale, medication(s)/side effects and non-pharmacologic comfort measures Outcome: Adequate for Discharge   Problem: Health Behavior/Discharge Planning: Goal: Ability to manage health-related needs will improve Outcome: Adequate for Discharge   Problem: Clinical Measurements: Goal: Ability to maintain clinical measurements within normal limits will improve Outcome: Adequate for Discharge Goal: Will remain free from infection Outcome: Adequate for Discharge Goal: Diagnostic test results will improve Outcome: Adequate for Discharge Goal: Respiratory complications will improve Outcome: Adequate for Discharge Goal: Cardiovascular complication will be avoided Outcome: Adequate for Discharge   Problem: Activity: Goal: Risk for activity intolerance will decrease Outcome: Adequate for Discharge   Problem: Nutrition: Goal: Adequate nutrition will be maintained Outcome: Adequate for Discharge   Problem: Coping: Goal: Level of anxiety will decrease Outcome: Adequate for Discharge   Problem: Elimination: Goal: Will not experience complications related to bowel motility Outcome: Adequate for Discharge Goal: Will not experience complications related to urinary retention Outcome: Adequate for Discharge   Problem: Pain Managment: Goal: General experience of comfort will improve and/or be controlled Outcome: Adequate for Discharge   Problem: Safety: Goal: Ability to remain free from injury will improve Outcome: Adequate for Discharge   Problem: Education: Goal: Knowledge of disease or condition will improve Outcome: Adequate for Discharge Goal: Knowledge of the prescribed therapeutic regimen will improve Outcome: Adequate for Discharge Goal: Individualized Educational Video(s) Outcome: Adequate for Discharge   Problem: Skin Integrity: Goal: Risk for impaired  skin integrity will decrease Outcome: Adequate for Discharge   Problem: Activity: Goal: Ability to tolerate increased activity will improve Outcome: Adequate for Discharge Goal: Will verbalize the importance of balancing activity with adequate rest periods Outcome: Adequate for Discharge   Problem: Respiratory: Goal: Ability to maintain a clear airway will improve Outcome: Adequate for Discharge Goal: Levels of oxygenation will improve Outcome: Adequate for Discharge Goal: Ability to maintain adequate ventilation will improve Outcome: Adequate for Discharge

## 2024-01-01 ENCOUNTER — Other Ambulatory Visit: Payer: Self-pay

## 2024-01-01 ENCOUNTER — Emergency Department (HOSPITAL_BASED_OUTPATIENT_CLINIC_OR_DEPARTMENT_OTHER)

## 2024-01-01 ENCOUNTER — Emergency Department (HOSPITAL_COMMUNITY)

## 2024-01-01 ENCOUNTER — Observation Stay (HOSPITAL_COMMUNITY)
Admission: EM | Admit: 2024-01-01 | Discharge: 2024-01-02 | Disposition: A | Discharge status: Home Health | Attending: Internal Medicine | Admitting: Internal Medicine

## 2024-01-01 DIAGNOSIS — Z87891 Personal history of nicotine dependence: Secondary | ICD-10-CM | POA: Diagnosis not present

## 2024-01-01 DIAGNOSIS — J441 Chronic obstructive pulmonary disease with (acute) exacerbation: Secondary | ICD-10-CM | POA: Diagnosis not present

## 2024-01-01 DIAGNOSIS — K219 Gastro-esophageal reflux disease without esophagitis: Secondary | ICD-10-CM | POA: Diagnosis present

## 2024-01-01 DIAGNOSIS — Z79899 Other long term (current) drug therapy: Secondary | ICD-10-CM | POA: Insufficient documentation

## 2024-01-01 DIAGNOSIS — R0602 Shortness of breath: Secondary | ICD-10-CM | POA: Diagnosis present

## 2024-01-01 DIAGNOSIS — J432 Centrilobular emphysema: Secondary | ICD-10-CM

## 2024-01-01 DIAGNOSIS — E785 Hyperlipidemia, unspecified: Secondary | ICD-10-CM | POA: Insufficient documentation

## 2024-01-01 DIAGNOSIS — M79662 Pain in left lower leg: Secondary | ICD-10-CM | POA: Diagnosis not present

## 2024-01-01 DIAGNOSIS — J9611 Chronic respiratory failure with hypoxia: Secondary | ICD-10-CM | POA: Diagnosis present

## 2024-01-01 DIAGNOSIS — I10 Essential (primary) hypertension: Secondary | ICD-10-CM | POA: Diagnosis present

## 2024-01-01 DIAGNOSIS — Z96641 Presence of right artificial hip joint: Secondary | ICD-10-CM | POA: Diagnosis not present

## 2024-01-01 HISTORY — DX: Chronic obstructive pulmonary disease, unspecified: J44.9

## 2024-01-01 LAB — CBC WITH DIFFERENTIAL/PLATELET
Abs Immature Granulocytes: 0.03 K/uL (ref 0.00–0.07)
Basophils Absolute: 0 K/uL (ref 0.0–0.1)
Basophils Relative: 1 %
Eosinophils Absolute: 0.2 K/uL (ref 0.0–0.5)
Eosinophils Relative: 3 %
HCT: 40.6 % (ref 36.0–46.0)
Hemoglobin: 12.1 g/dL (ref 12.0–15.0)
Immature Granulocytes: 0 %
Lymphocytes Relative: 26 %
Lymphs Abs: 1.9 K/uL (ref 0.7–4.0)
MCH: 29.1 pg (ref 26.0–34.0)
MCHC: 29.8 g/dL — ABNORMAL LOW (ref 30.0–36.0)
MCV: 97.6 fL (ref 80.0–100.0)
Monocytes Absolute: 0.8 K/uL (ref 0.1–1.0)
Monocytes Relative: 11 %
Neutro Abs: 4.3 K/uL (ref 1.7–7.7)
Neutrophils Relative %: 59 %
Platelets: 264 K/uL (ref 150–400)
RBC: 4.16 MIL/uL (ref 3.87–5.11)
RDW: 14.6 % (ref 11.5–15.5)
WBC: 7.3 K/uL (ref 4.0–10.5)
nRBC: 0 % (ref 0.0–0.2)

## 2024-01-01 LAB — TROPONIN I (HIGH SENSITIVITY)
Troponin I (High Sensitivity): 5 ng/L (ref <18)
Troponin I (High Sensitivity): 5 ng/L (ref <18)

## 2024-01-01 LAB — BASIC METABOLIC PANEL WITH GFR
Anion gap: 11 (ref 5–15)
BUN: 10 mg/dL (ref 8–23)
CO2: 32 mmol/L (ref 22–32)
Calcium: 9.1 mg/dL (ref 8.9–10.3)
Chloride: 96 mmol/L — ABNORMAL LOW (ref 98–111)
Creatinine, Ser: 0.69 mg/dL (ref 0.44–1.00)
GFR, Estimated: >60 mL/min (ref >60)
Glucose, Bld: 107 mg/dL — ABNORMAL HIGH (ref 70–99)
Potassium: 3.6 mmol/L (ref 3.5–5.1)
Sodium: 139 mmol/L (ref 135–145)

## 2024-01-01 LAB — URINALYSIS, ROUTINE W REFLEX MICROSCOPIC
Bilirubin Urine: NEGATIVE
Glucose, UA: NEGATIVE mg/dL
Ketones, ur: NEGATIVE mg/dL
Leukocytes,Ua: NEGATIVE
Nitrite: NEGATIVE
Protein, ur: NEGATIVE mg/dL
Specific Gravity, Urine: 1.012 (ref 1.005–1.030)
pH: 6 (ref 5.0–8.0)

## 2024-01-01 MED ORDER — OXYCODONE-ACETAMINOPHEN 10-325 MG PO TABS: 1.0000 | ORAL_TABLET | Freq: Four times a day (QID) | ORAL | Status: DC

## 2024-01-01 MED ORDER — ONDANSETRON HCL 4 MG PO TABS: 4.0000 mg | ORAL_TABLET | Freq: Four times a day (QID) | ORAL | Status: DC | PRN

## 2024-01-01 MED ORDER — SODIUM CHLORIDE 0.9% FLUSH: 3.0000 mL | INTRAVENOUS | Status: DC | PRN

## 2024-01-01 MED ORDER — ACETAMINOPHEN 500 MG PO TABS
1000.0000 mg | ORAL_TABLET | Freq: Once | ORAL | Status: AC
  Administered 2024-01-01: 1000 mg via ORAL
  Filled 2024-01-01: qty 2

## 2024-01-01 MED ORDER — ENSURE ENLIVE PO LIQD
237.0000 mL | Freq: Three times a day (TID) | ORAL | Status: DC
  Administered 2024-01-02: 237 mL via ORAL
  Filled 2024-01-01 (×6): qty 237

## 2024-01-01 MED ORDER — ADULT MULTIVITAMIN W/MINERALS CH
1.0000 | ORAL_TABLET | Freq: Every day | ORAL | Status: DC
  Administered 2024-01-01 – 2024-01-02 (×2): 1 via ORAL
  Filled 2024-01-01 (×3): qty 1

## 2024-01-01 MED ORDER — OXYCODONE-ACETAMINOPHEN 5-325 MG PO TABS
1.0000 | ORAL_TABLET | ORAL | Status: DC | PRN
  Administered 2024-01-01 – 2024-01-02 (×5): 1 via ORAL
  Filled 2024-01-01 (×5): qty 1

## 2024-01-01 MED ORDER — PANTOPRAZOLE SODIUM 40 MG PO TBEC
40.0000 mg | DELAYED_RELEASE_TABLET | Freq: Every day | ORAL | Status: DC
  Administered 2024-01-01 – 2024-01-02 (×2): 40 mg via ORAL
  Filled 2024-01-01 (×2): qty 1

## 2024-01-01 MED ORDER — POLYETHYLENE GLYCOL 3350 17 G PO PACK: 17.0000 g | PACK | Freq: Every day | ORAL | Status: DC | PRN

## 2024-01-01 MED ORDER — HYDROCHLOROTHIAZIDE 25 MG PO TABS
25.0000 mg | ORAL_TABLET | Freq: Every day | ORAL | Status: DC
  Administered 2024-01-02: 25 mg via ORAL
  Filled 2024-01-01: qty 1

## 2024-01-01 MED ORDER — SODIUM CHLORIDE 0.9% FLUSH
3.0000 mL | Freq: Two times a day (BID) | INTRAVENOUS | Status: DC
  Administered 2024-01-01 – 2024-01-02 (×3): 3 mL via INTRAVENOUS

## 2024-01-01 MED ORDER — PREDNISONE 20 MG PO TABS
40.0000 mg | ORAL_TABLET | Freq: Every day | ORAL | Status: DC
  Administered 2024-01-02: 40 mg via ORAL
  Filled 2024-01-01: qty 2

## 2024-01-01 MED ORDER — METHYLPREDNISOLONE SODIUM SUCC 40 MG IJ SOLR
40.0000 mg | Freq: Two times a day (BID) | INTRAMUSCULAR | Status: AC
  Administered 2024-01-01 (×2): 40 mg via INTRAVENOUS
  Filled 2024-01-01 (×2): qty 1

## 2024-01-01 MED ORDER — ATORVASTATIN CALCIUM 10 MG PO TABS
10.0000 mg | ORAL_TABLET | Freq: Every day | ORAL | Status: DC
  Administered 2024-01-02: 10 mg via ORAL
  Filled 2024-01-01: qty 1

## 2024-01-01 MED ORDER — NICOTINE 14 MG/24HR TD PT24
14.0000 mg | MEDICATED_PATCH | Freq: Every day | TRANSDERMAL | Status: DC
  Administered 2024-01-01 – 2024-01-02 (×2): 14 mg via TRANSDERMAL
  Filled 2024-01-01 (×2): qty 1

## 2024-01-01 MED ORDER — IPRATROPIUM-ALBUTEROL 0.5-2.5 (3) MG/3ML IN SOLN
3.0000 mL | Freq: Four times a day (QID) | RESPIRATORY_TRACT | Status: DC
  Administered 2024-01-01 (×3): 3 mL via RESPIRATORY_TRACT
  Filled 2024-01-01 (×3): qty 3

## 2024-01-01 MED ORDER — SODIUM CHLORIDE 0.9 % IV SOLN: 250.0000 mL | INTRAVENOUS | Status: AC | PRN

## 2024-01-01 MED ORDER — ACETAMINOPHEN 650 MG RE SUPP: 650.0000 mg | Freq: Four times a day (QID) | RECTAL | Status: DC | PRN

## 2024-01-01 MED ORDER — ENALAPRIL MALEATE 10 MG PO TABS
10.0000 mg | ORAL_TABLET | Freq: Every day | ORAL | Status: DC
  Administered 2024-01-02: 10 mg via ORAL
  Filled 2024-01-01: qty 1

## 2024-01-01 MED ORDER — BENZONATATE 100 MG PO CAPS
100.0000 mg | ORAL_CAPSULE | Freq: Three times a day (TID) | ORAL | Status: DC | PRN
  Administered 2024-01-01: 100 mg via ORAL
  Filled 2024-01-01: qty 1

## 2024-01-01 MED ORDER — ZOLPIDEM TARTRATE 5 MG PO TABS
5.0000 mg | ORAL_TABLET | Freq: Every day | ORAL | Status: DC
  Administered 2024-01-01: 5 mg via ORAL
  Filled 2024-01-01: qty 1

## 2024-01-01 MED ORDER — FLUTICASONE FUROATE-VILANTEROL 100-25 MCG/ACT IN AEPB
1.0000 | INHALATION_SPRAY | Freq: Every day | RESPIRATORY_TRACT | Status: DC
  Filled 2024-01-01: qty 28

## 2024-01-01 MED ORDER — ENALAPRIL-HYDROCHLOROTHIAZIDE 10-25 MG PO TABS: 1.0000 | ORAL_TABLET | Freq: Every day | ORAL | Status: DC

## 2024-01-01 MED ORDER — HYDRALAZINE HCL 20 MG/ML IJ SOLN: 5.0000 mg | Freq: Four times a day (QID) | INTRAMUSCULAR | Status: DC | PRN

## 2024-01-01 MED ORDER — DOXYCYCLINE HYCLATE 100 MG PO TABS
100.0000 mg | ORAL_TABLET | Freq: Two times a day (BID) | ORAL | Status: DC
  Administered 2024-01-01 – 2024-01-02 (×3): 100 mg via ORAL
  Filled 2024-01-01 (×3): qty 1

## 2024-01-01 MED ORDER — MIRTAZAPINE 15 MG PO TABS
7.5000 mg | ORAL_TABLET | Freq: Every day | ORAL | Status: DC
  Administered 2024-01-01: 7.5 mg via ORAL
  Filled 2024-01-01: qty 1

## 2024-01-01 MED ORDER — OXYCODONE HCL 5 MG PO TABS
5.0000 mg | ORAL_TABLET | ORAL | Status: DC | PRN
  Administered 2024-01-01 – 2024-01-02 (×5): 5 mg via ORAL
  Filled 2024-01-01 (×5): qty 1

## 2024-01-01 MED ORDER — HYDROXYZINE HCL 25 MG PO TABS: 25.0000 mg | ORAL_TABLET | Freq: Three times a day (TID) | ORAL | Status: DC | PRN

## 2024-01-01 MED ORDER — IPRATROPIUM-ALBUTEROL 0.5-2.5 (3) MG/3ML IN SOLN: 3.0000 mL | RESPIRATORY_TRACT | Status: DC

## 2024-01-01 MED ORDER — ENOXAPARIN SODIUM 40 MG/0.4ML IJ SOSY
40.0000 mg | PREFILLED_SYRINGE | Freq: Every day | INTRAMUSCULAR | Status: DC
  Administered 2024-01-01 – 2024-01-02 (×2): 40 mg via SUBCUTANEOUS
  Filled 2024-01-01 (×2): qty 0.4

## 2024-01-01 MED ORDER — ACETAMINOPHEN 325 MG PO TABS: 650.0000 mg | ORAL_TABLET | Freq: Four times a day (QID) | ORAL | Status: DC | PRN

## 2024-01-01 MED ORDER — ONDANSETRON HCL 4 MG/2ML IJ SOLN: 4.0000 mg | Freq: Four times a day (QID) | INTRAMUSCULAR | Status: DC | PRN

## 2024-01-01 MED ORDER — OXYCODONE HCL 5 MG PO TABS
10.0000 mg | ORAL_TABLET | Freq: Once | ORAL | Status: AC
  Administered 2024-01-01: 10 mg via ORAL
  Filled 2024-01-01: qty 2

## 2024-01-01 MED ORDER — IPRATROPIUM-ALBUTEROL 0.5-2.5 (3) MG/3ML IN SOLN
3.0000 mL | RESPIRATORY_TRACT | Status: DC
  Administered 2024-01-02 (×3): 3 mL via RESPIRATORY_TRACT
  Filled 2024-01-01 (×3): qty 3

## 2024-01-01 NOTE — Progress Notes (Signed)
 Attempted to give patient Ensure plus after contacting pharmacy regarding patient's request for a strawberry or chocolate ensure enlive. Pharmacy stated stock was in med room. Only noted to have ensure plus chocolate. Offered it to patient and she stated she did not want that, she only wanted the ensure enlive. Patient upset with this RN when I attempted to explain to her that her orders were for ensure enlive or plus, and she stated I was lying and that she already had this conversation earlier with her day nurse that she did not want the ensure PLUS.   She then stated that she felt like this RN was being rude and said I don't ask you all for nothing. This narrator apologized as the only intention was to explain the situation and explain why it took longer than anticipated. Explained that I have other patient's I have been caring for, so It took longer than expected to follow up with the pharmacy. Asked if she needed anything,  she should not hesitate to call. We are here to provide her with the care she needs.  She stated she did not want her bed alarm on. She also stated she did not want any staff to wake her up except for vital signs.   Explained the importance of our rounding, and that we will not wake her but we do have to check on her periodically through the night. She did ask for her Duo Neb early.   Contacted RT and mid level provider to get Duo Neb orders changed to q4h so that dose could be administered now.

## 2024-01-01 NOTE — Progress Notes (Signed)
 Patient found to have an irregular heart beat. Patient is not ordered telemetry at this time. Alerted Dr. Sonjia and no new orders were given.

## 2024-01-01 NOTE — Progress Notes (Signed)
 LLE venous exam is completed. Hazel Leveille, RVT

## 2024-01-01 NOTE — ED Notes (Signed)
 RN called 5E s/w Chloe to give report and was informed that receiving nurse was on lunch

## 2024-01-01 NOTE — ED Notes (Signed)
 Patient O2 dropped to 77% RA RN applied O2 via [place] @2L 

## 2024-01-01 NOTE — ED Notes (Addendum)
 Patient refused to sign AMA form stating provider told her she was ok and results were ok and she does not understand why she is being admitted. RN and MD explained to patient that it is not safe for her to leave and she does not have her home oxygen, patient states she will have family bring her items from home but she does not wish to stay. RN explained to patient she can wait in lobby for transportation but due to leaving AMA we can not provide transportation, she I currently on phone with her niece.

## 2024-01-01 NOTE — H&P (Signed)
 [facility] History and Physical  [name] FMW:[id number] DOB: [birth year] DOA: 01/01/2024  Referring physician: ED  PCP: [name] [name], NP   Patient is coming from: Home  Chief Complaint: Shortness of breath  HPI:  Patient is a 68 years old female with past medical history of COPD, anxiety, hypertension on as needed oxygen 2 to 4 L presented to the hospital with increasing shortness of breath for last few days.  EMS noted that her pulse ox was 77% on room air and was given 2 DuoNebs and Solu-Medrol  including magnesium  and was brought into the hospital.  In the ED patient still complains of having wheezing and tightness with runny nose and congestion with some coughing.  She also noticed left foot and lower leg swelling which is unusual for her.  Patient denies any nausea, vomiting, fever, chills diarrhea.  Denies any dizziness lightheadedness or syncope.  Denies any chest pain or palpitation.  Denies any urinary urgency, frequency or dysuria.  Denies any sick contacts or recent travel.  In the ED patient was noted to be in acute distress with tachypnea, accessory muscle usage decreased breath sounds and wheezing with left foot and ankle area swelling.  Initial vitals were notable for blood pressure of 127/77.  Pulse ox was 87% on nasal cannula in the ED.  Patient was afebrile.  Initial labs showed normal CBC.  BMP was notable for glucose slightly elevated at 107.  Urinalysis negative for Infection.  Chest x-ray showed no acute infiltrate.  Patient was then considered for admission to the hospital for further evaluation and treatment.   Assessment and Plan Principal Problem:   Acute exacerbation of chronic obstructive pulmonary disease (COPD) (HCC) Active Problems:   Chronic hypoxic respiratory failure (HCC)   Essential hypertension   Gastroesophageal reflux disease   Acute COPD exacerbation.  Continue continue nebulizer oxygen IV steroids and bronchodilators.  Patient  initially received in the ED.  Will continue while in hospital.  Chest x-ray without any obvious infiltrate.  Empirically put the patient on doxycycline .  Chronic hypoxic respiratory failure.  Patient is intermittently using oxygen 2 to 4 L at home.  Was hypoxic on presentation and is currently on nasal cannula oxygen.  Essential hypertension Continue enalapril  HCTZ from home  GERD Protonix   Hyperlipidemia.  Continue Lipitor.  History of nicotine  dependence.  States that she does not smoke anymore but wishes to be a nicotine  patch.  Will continue nicotine  patch.  DVT Prophylaxis: Lovenox  subcu  Review of Systems:  All systems were reviewed and were negative unless otherwise mentioned in the HPI   Past Medical History:  Diagnosis Date   Arthritis    Asthma    Chest pain 2006   Chest pain 10/2016   Chronic back pain    Hypertension    Sciatica    Shortness of breath    due to  smoking    Past Surgical History:  Procedure Laterality Date   ABDOMINAL HYSTERECTOMY  1997   CARDIAC CATHETERIZATION  2006   HEMI-MICRODISCECTOMY LUMBAR LAMINECTOMY LEVEL 1  05/14/2012   Procedure: HEMI-MICRODISCECTOMY LUMBAR LAMINECTOMY LEVEL 1;  Surgeon: Tanda DELENA Heading, MD;  Location: [facility];  Service: Orthopedics;  Laterality: N/A;  Hemi Laminectomy Microdiscectomy L4 - L5 Central (X-Ray)   ingrown toenail removal     TONSILLECTOMY     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 She has a 3 pack-year smoking history. She has never used smokeless tobacco. She reports that she does not drink alcohol and does not use drugs.  No Known Allergies  Family History  Problem Relation Age of Onset   Heart attack Mother 75   Hypertension Sister    Diabetes type II Sister    Tongue cancer Brother    Heart  attack Sister 18   Lung cancer Sister    Hypertension Sister      Prior to Admission medications   Medication Sig Start Date End Date Taking? Authorizing Provider  albuterol  (PROVENTIL ) (2.5 MG/3ML) 0.083% nebulizer solution Take 2.5 mg by nebulization every 6 (six) hours as needed for wheezing or shortness of breath.    [provider]  albuterol  (VENTOLIN  HFA) 108 (90 Base) MCG/ACT inhaler Inhale 2 puffs into the lungs every 4 (four) hours as needed for wheezing or shortness of breath. 07/24/23   Kara Dorn NOVAK, MD  atorvastatin  (LIPITOR) 10 MG tablet Take 10 mg by mouth at bedtime.    [provider]  azithromycin  (ZITHROMAX ) 250 MG tablet Take 2 tablets (500 mg total) by mouth daily. 12/06/23   Akula, Vijaya, MD  benzonatate  (TESSALON ) 100 MG capsule Take 1 capsule (100 mg total) by mouth 3 (three) times daily as needed for cough. 12/06/23   Akula, Vijaya, MD  enalapril -hydrochlorothiazide  (VASERETIC ) 10-25 MG tablet Take 1 tablet by mouth daily.    [provider]  feeding supplement (ENSURE ENLIVE / ENSURE PLUS) LIQD Take 237 mLs by mouth 3 (three) times daily between meals. 09/06/23   Rizwan, Saima, MD  fluticasone -salmeterol (ADVAIR  DISKUS) 250-50 MCG/ACT AEPB Inhale 1 puff into the lungs in the morning and at bedtime. 12/06/23   Akula, Vijaya, MD  hydrOXYzine  (ATARAX ) 25 MG tablet Take 25 mg by mouth 3 (three) times daily as needed for anxiety. 09/25/23   [provider]  ipratropium-albuterol  (DUONEB) 0.5-2.5 (3) MG/3ML SOLN Take 3 mLs by nebulization every 6 (six) hours as needed. 12/06/23   Akula, Vijaya, MD  mirtazapine  (REMERON ) 7.5 MG tablet Take 7.5 mg by mouth at bedtime. 09/26/23   [provider]  Multiple Vitamin (MULTIVITAMIN) capsule Take 1 capsule by mouth daily.    [provider]  nicotine  (NICODERM CQ  - DOSED IN MG/24 HOURS) 14 mg/24hr patch Place 1 patch (14 mg total) onto the skin daily. 12/07/23   Akula, Vijaya, MD   oxyCODONE -acetaminophen  (PERCOCET) 10-325 MG tablet Take 1 tablet by mouth 4 (four) times daily. 12/23/20   [provider]  OXYGEN Inhale 3 L/min into the lungs as needed (for shortness of breath).    [provider]  polyethylene glycol (MIRALAX  / GLYCOLAX ) 17 g packet Take 17 g by mouth daily as needed for mild constipation. 05/27/23   Tobie Yetta HERO, MD  predniSONE  (DELTASONE ) 20 MG tablet Take 2 tablets by mouth daily for 3 days followed by  1 and 1/2 tablets by mouth daily for 3 days followed by  1 tablet daily by mouth daily for 3 days followed by  1/2 tablet by mouth daily for 3 days. 12/07/23   Cherlyn Labella, MD  PRESCRIPTION MEDICATION See admin instructions. BiPAP- At bedtime    [provider]  zolpidem  (AMBIEN ) 5 MG tablet Take 5 mg by mouth at bedtime. 12/25/20   [provider]    Physical Exam:  Vitals:   01/01/24  1030 01/01/24 1043 01/01/24 1130 01/01/24 1226  BP: 127/77  (!) 120/59 119/80  Pulse: 72   70  Resp: (!) 26  16 16   Temp:  97.9 F (36.6 C)  97.9 F (36.6 C)  TempSrc:    Oral  SpO2: 100%   (!) 87%  Weight: 46 kg     Height: 5' 1.5 (1.562 m)      Wt Readings from Last 3 Encounters:  01/01/24 46 kg  12/03/23 45 kg  10/16/23 45 kg   Body mass index is 18.85 kg/m.  General:  Average built, not in obvious distress, on nasal cannula oxygen HENT: Normocephalic, No scleral pallor or icterus noted. Oral mucosa is moist.  Chest:  Clear breath sounds.  . No crackles or wheezes.  CVS: S1 &S2 heard. No murmur.  Regular rate and rhythm. Abdomen: Soft, nontender, nondistended.  Bowel sounds are heard. No abdominal mass palpated Extremities: No cyanosis, clubbing or edema.  Peripheral pulses are palpable. Psych: Alert, awake and oriented, normal mood CNS:  No cranial nerve deficits.  Power equal in all extremities.   Skin: Warm and dry.  No rashes noted.  Labs on Admission:   CBC: Recent Labs  Lab 01/01/24 1033  WBC 7.3   NEUTROABS 4.3  HGB 12.1  HCT 40.6  MCV 97.6  PLT 264    Basic Metabolic Panel: Recent Labs  Lab 01/01/24 1033  NA 139  K 3.6  CL 96*  CO2 32  GLUCOSE 107*  BUN 10  CREATININE 0.69  CALCIUM  9.1    Liver Function Tests: No results for input(s): AST, ALT, ALKPHOS, BILITOT, PROT, ALBUMIN in the last 168 hours. No results for input(s): LIPASE, AMYLASE in the last 168 hours. No results for input(s): AMMONIA in the last 168 hours.  Cardiac Enzymes: No results for input(s): CKTOTAL, CKMB, CKMBINDEX, TROPONINI in the last 168 hours.  BNP (last 3 results) Recent Labs    09/04/23 2151 10/11/23 0638 12/02/23 2209  BNP 24.3 22.7 32.9    ProBNP (last 3 results) No results for input(s): PROBNP in the last 8760 hours.  CBG: No results for input(s): GLUCAP in the last 168 hours.  Lipase     Component Value Date/Time   LIPASE 22 01/17/2021 1357     Urinalysis    Component Value Date/Time   COLORURINE YELLOW 01/01/2024 1133   APPEARANCEUR CLEAR 01/01/2024 1133   LABSPEC 1.012 01/01/2024 1133   PHURINE 6.0 01/01/2024 1133   GLUCOSEU NEGATIVE 01/01/2024 1133   HGBUR SMALL (A) 01/01/2024 1133   BILIRUBINUR NEGATIVE 01/01/2024 1133   KETONESUR NEGATIVE 01/01/2024 1133   PROTEINUR NEGATIVE 01/01/2024 1133   UROBILINOGEN 0.2 10/29/2013 1114   NITRITE NEGATIVE 01/01/2024 1133   LEUKOCYTESUR NEGATIVE 01/01/2024 1133     Drugs of Abuse     Component Value Date/Time   LABOPIA POSITIVE (A) 10/12/2023 0330   COCAINSCRNUR NONE DETECTED 10/12/2023 0330   LABBENZ NONE DETECTED 10/12/2023 0330   AMPHETMU NONE DETECTED 10/12/2023 0330   THCU NONE DETECTED 10/12/2023 0330   LABBARB NONE DETECTED 10/12/2023 0330      Radiological Exams on Admission: DG Chest Port 1 View Result Date: 01/01/2024 EXAM: 1 VIEW XRAY OF THE CHEST 01/01/2024 11:00:00 AM COMPARISON: None available. CLINICAL HISTORY: SOB. Per chart - Patient to ED by EMS from home with  c/o SOB. Patient states she as had symptoms for the past few days but SOB worsened today. Fire got to her home and gave albuterol  treatment  due to patient O2 being 71% RA EMS arrived and gave 2 Duoneb treatments, solu medrol  and a Mag drip she came up to 100% RA. She c/o tightness in chest denies fever chills N/V. FINDINGS: LUNGS AND PLEURA: No focal pulmonary opacity. No pulmonary edema. No pleural effusion. No pneumothorax. HEART AND MEDIASTINUM: No acute abnormality of the cardiac and mediastinal silhouettes. BONES AND SOFT TISSUES: Multilevel thoracic osteophytosis. Diffuse osteopenia. No acute osseous abnormality. IMPRESSION: 1. No acute cardiopulmonary abnormality. Electronically signed by: Rogelia Myers MD 01/01/2024 12:26 PM EDT RP Workstation: [id number]   VAS US  LOWER EXTREMITY VENOUS (DVT) (7a-7p) Result Date: 01/01/2024  Lower Venous DVT Study Patient Name:  [name]  Date of Exam:   01/01/2024 Medical Rec #: [id number]       Accession #:    [id number] Date of Birth: 04/04/1956       Patient Gender: F Patient Age:   41 years Exam Location:  [facility] Procedure:      VAS US  LOWER EXTREMITY VENOUS (DVT) Referring Phys: MELANIE BELFI --------------------------------------------------------------------------------  Indications: Pain.  Performing Technologist: Elmarie Lindau, RVT  Examination Guidelines: A complete evaluation includes B-mode imaging, spectral Doppler, color Doppler, and power Doppler as needed of all accessible portions of each vessel. Bilateral testing is considered an integral part of a complete examination. Limited examinations for reoccurring indications may be performed as noted. The reflux portion of the exam is performed with the patient in reverse Trendelenburg.  +-----+---------------+---------+-----------+----------+--------------+ RIGHTCompressibilityPhasicitySpontaneityPropertiesThrombus Aging  +-----+---------------+---------+-----------+----------+--------------+ CFV  Full           Yes      Yes                                 +-----+---------------+---------+-----------+----------+--------------+   +---------+---------------+---------+-----------+----------+--------------+ LEFT     CompressibilityPhasicitySpontaneityPropertiesThrombus Aging +---------+---------------+---------+-----------+----------+--------------+ CFV      Full           Yes      Yes                                 +---------+---------------+---------+-----------+----------+--------------+ SFJ      Full                                                        +---------+---------------+---------+-----------+----------+--------------+ FV Prox  Full                                                        +---------+---------------+---------+-----------+----------+--------------+ FV Mid   Full                                                        +---------+---------------+---------+-----------+----------+--------------+ FV DistalFull                                                        +---------+---------------+---------+-----------+----------+--------------+  PFV      Full                                                        +---------+---------------+---------+-----------+----------+--------------+ POP      Full           Yes      Yes                                 +---------+---------------+---------+-----------+----------+--------------+ PTV      Full                                                        +---------+---------------+---------+-----------+----------+--------------+ PERO     Full                                                        +---------+---------------+---------+-----------+----------+--------------+ There is a fluid filled appearing, nonvascularized area in the left popliteal fossa measuring 2.37 x 1.03 cm.    Summary: RIGHT: - No  evidence of common femoral vein obstruction.   LEFT: - There is no evidence of deep vein thrombosis in the lower extremity.  - A cystic structure is found in the popliteal fossa.  *See table(s) above for measurements and observations.    Preliminary     EKG: Personally reviewed by me which shows sinus rhythm with right axis deviation   Consultant: None  Code Status: Full code  Microbiology none  Antibiotics: Doxycycline   Family Communication:  Patients' condition and plan of care including tests being ordered have been discussed with the patient  who indicate understanding and agree with the plan.   Status is: Observation The patient remains OBS appropriate and will d/c before 2 midnights.   Severity of Illness: The appropriate patient status for this patient is OBSERVATION. Observation status is judged to be reasonable and necessary in order to provide the required intensity of service to ensure the patient's safety. The patient's presenting symptoms, physical exam findings, and initial radiographic and laboratory data in the context of their medical condition is felt to place them at decreased risk for further clinical deterioration. Furthermore, it is anticipated that the patient will be medically stable for discharge from the hospital within 2 midnights of admission.   Signed, Vernal Alstrom, MD [facility] 01/01/2024

## 2024-01-01 NOTE — ED Triage Notes (Signed)
 Patient to ED by EMS from home with c/o SOB. Patient states she as had symptoms for the past few days but SOB worsened today. Fire got to her home and gave albuterol  treatment due to patient O2 being 71% RA EMS arrived and gave 2 Duoneb treatments, solu medrol  and a Mag drip she came up to 100% RA. She c/o tightness in chest denies fever chills N/V.

## 2024-01-01 NOTE — ED Notes (Signed)
 ED TO INPATIENT HANDOFF REPORT  ED Nurse Name and Phone #: Olen RN [phone]  S Name/Age/Gender Charlies BRAVO Wheller 68 y.o. female Room/Bed: WA16/WA16  Code Status   Code Status: Prior  Home/SNF/Other Home Patient oriented to: self, place, time, and situation Is this baseline? Yes   Triage Complete: Triage complete  Chief Complaint Acute exacerbation of chronic obstructive pulmonary disease (COPD) (HCC) [J44.1]  Triage Note Patient to ED by EMS from home with c/o SOB. Patient states she as had symptoms for the past few days but SOB worsened today. Fire got to her home and gave albuterol  treatment due to patient O2 being 71% RA EMS arrived and gave 2 Duoneb treatments, solu medrol  and a Mag drip she came up to 100% RA. She c/o tightness in chest denies fever chills N/V.    Allergies No Known Allergies  Level of Care/Admitting Diagnosis ED Disposition     ED Disposition  Admit   Condition  --   Comment  Hospital Area: [facility] [[id number]]  Level of Care: Med-Surg [16]  May place patient in observation at [facility] or [facility] if equivalent level of care is available:: No  Covid Evaluation: Asymptomatic - no recent exposure (last 10 days) testing not required  Diagnosis: Acute exacerbation of chronic obstructive pulmonary disease (COPD) [facility]) [[id number]]  Admitting Physician: POKHREL, LAXMAN [[id number]]  Attending Physician: POKHREL, LAXMAN [phone]  For patients discharging to extended facilities (i.e. SNF, AL, group homes or LTAC) initiate:: Discharge to SNF/Facility Placement COVID-19 Lab Testing Protocol          B Medical/Surgery History Past Medical History:  Diagnosis Date   Arthritis    Asthma    Chest pain 2006   Chest pain 10/2016   Chronic back pain    Hypertension    Sciatica    Shortness of breath    due to  smoking    Past Surgical History:  Procedure Laterality Date   ABDOMINAL HYSTERECTOMY  1997   CARDIAC  CATHETERIZATION  2006   HEMI-MICRODISCECTOMY LUMBAR LAMINECTOMY LEVEL 1  05/14/2012   Procedure: HEMI-MICRODISCECTOMY LUMBAR LAMINECTOMY LEVEL 1;  Surgeon: Tanda DELENA Heading, MD;  Location: [facility];  Service: Orthopedics;  Laterality: N/A;  Hemi Laminectomy Microdiscectomy L4 - L5 Central (X-Ray)   ingrown toenail removal     TONSILLECTOMY     TOTAL HIP ARTHROPLASTY Left 11/05/2013   Procedure: LEFT TOTAL HIP ARTHROPLASTY;  Surgeon: Tanda DELENA Heading, MD;  Location: [facility];  Service: Orthopedics;  Laterality: Left;     A IV Location/Drains/Wounds Patient Lines/Drains/Airways Status     Active Line/Drains/Airways     Name Placement date Placement time Site Days   Peripheral IV 01/01/24 20 G 1 Left Antecubital 01/01/24  1026  Antecubital  less than 1            Intake/Output Last 24 hours No intake or output data in the 24 hours ending 01/01/24 1324  Labs/Imaging Results for orders placed or performed during the hospital encounter of 01/01/24 (from the past 48 hours)  Basic metabolic panel     Status: Abnormal   Collection Time: 01/01/24 10:33 AM  Result Value Ref Range   Sodium 139 135 - 145 mmol/L   Potassium 3.6 3.5 - 5.1 mmol/L   Chloride 96 (L) 98 - 111 mmol/L   CO2 32 22 - 32 mmol/L   Glucose, Bld 107 (H) 70 - 99 mg/dL    Comment: Glucose reference range applies only  to samples taken after fasting for at least 8 hours.   BUN 10 8 - 23 mg/dL   Creatinine, Ser 9.30 0.44 - 1.00 mg/dL   Calcium  9.1 8.9 - 10.3 mg/dL   GFR, Estimated >39 >39 mL/min    Comment: (NOTE) Calculated using the CKD-EPI Creatinine Equation (2021)    Anion gap 11 5 - 15    Comment: Performed at [facility], [address], [place], [place] [address]  CBC with Differential     Status: Abnormal   Collection Time: 01/01/24 10:33 AM  Result Value Ref Range   WBC 7.3 4.0 - 10.5 K/uL   RBC 4.16 3.87 - 5.11 MIL/uL   Hemoglobin 12.1 12.0 - 15.0 g/dL   HCT 59.3 63.9 - 53.9 %   MCV  97.6 80.0 - 100.0 fL   MCH 29.1 26.0 - 34.0 pg   MCHC 29.8 (L) 30.0 - 36.0 g/dL   RDW 85.3 88.4 - 84.4 %   Platelets 264 150 - 400 K/uL   nRBC 0.0 0.0 - 0.2 %   Neutrophils Relative % 59 %   Neutro Abs 4.3 1.7 - 7.7 K/uL   Lymphocytes Relative 26 %   Lymphs Abs 1.9 0.7 - 4.0 K/uL   Monocytes Relative 11 %   Monocytes Absolute 0.8 0.1 - 1.0 K/uL   Eosinophils Relative 3 %   Eosinophils Absolute 0.2 0.0 - 0.5 K/uL   Basophils Relative 1 %   Basophils Absolute 0.0 0.0 - 0.1 K/uL   Immature Granulocytes 0 %   Abs Immature Granulocytes 0.03 0.00 - 0.07 K/uL    Comment: Performed at [facility], [address], [place], [place] [address]  Troponin I (High Sensitivity)     Status: None   Collection Time: 01/01/24 10:33 AM  Result Value Ref Range   Troponin I (High Sensitivity) 5 <18 ng/L    Comment: (NOTE) Elevated high sensitivity troponin I (hsTnI) values and significant  changes across serial measurements may suggest ACS but many other  chronic and acute conditions are known to elevate hsTnI results.  Refer to the Links section for chest pain algorithms and additional  guidance. Performed at [facility], [address] [place], [place] [address]   Urinalysis, Routine w reflex microscopic -Urine, Clean Catch     Status: Abnormal   Collection Time: 01/01/24 11:33 AM  Result Value Ref Range   Color, Urine YELLOW YELLOW   APPearance CLEAR CLEAR   Specific Gravity, Urine 1.012 1.005 - 1.030   pH 6.0 5.0 - 8.0   Glucose, UA NEGATIVE NEGATIVE mg/dL   Hgb urine dipstick SMALL (A) NEGATIVE   Bilirubin Urine NEGATIVE NEGATIVE   Ketones, ur NEGATIVE NEGATIVE mg/dL   Protein, ur NEGATIVE NEGATIVE mg/dL   Nitrite NEGATIVE NEGATIVE   Leukocytes,Ua NEGATIVE NEGATIVE   RBC / HPF 0-5 0 - 5 RBC/hpf   WBC, UA 0-5 0 - 5 WBC/hpf   Bacteria, UA RARE (A) NONE SEEN   Squamous Epithelial / HPF 0-5 0 - 5 /HPF    Comment: Performed at [facility], [address], [place], [place] [address]   DG Chest Port 1 View Result Date: 01/01/2024 EXAM: 1 VIEW XRAY OF THE CHEST 01/01/2024 11:00:00 AM COMPARISON: None available. CLINICAL HISTORY: SOB. Per chart - Patient to ED by EMS from home with c/o SOB. Patient states she as had symptoms for the past few days but SOB worsened today. Fire got  to her home and gave albuterol  treatment due to patient O2 being 71% RA EMS arrived and gave 2 Duoneb treatments, solu medrol  and a Mag drip she came up to 100% RA. She c/o tightness in chest denies fever chills N/V. FINDINGS: LUNGS AND PLEURA: No focal pulmonary opacity. No pulmonary edema. No pleural effusion. No pneumothorax. HEART AND MEDIASTINUM: No acute abnormality of the cardiac and mediastinal silhouettes. BONES AND SOFT TISSUES: Multilevel thoracic osteophytosis. Diffuse osteopenia. No acute osseous abnormality. IMPRESSION: 1. No acute cardiopulmonary abnormality. Electronically signed by: Rogelia Myers MD 01/01/2024 12:26 PM EDT RP Workstation: [id number]   VAS US  LOWER EXTREMITY VENOUS (DVT) (7a-7p) Result Date: 01/01/2024  Lower Venous DVT Study Patient Name:  NICEY KRAH  Date of Exam:   01/01/2024 Medical Rec #: [id number]       Accession #:    [id number] Date of Birth: Sep 19, 1955       Patient Gender: F Patient Age:   40 years Exam Location:  [facility] Procedure:      VAS US  LOWER EXTREMITY VENOUS (DVT) Referring Phys: MELANIE BELFI --------------------------------------------------------------------------------  Indications: Pain.  Performing Technologist: Elmarie Lindau, RVT  Examination Guidelines: A complete evaluation includes B-mode imaging, spectral Doppler, color Doppler, and power Doppler as needed of all accessible portions of each vessel. Bilateral testing is considered an integral part of a complete examination. Limited examinations for reoccurring indications may be performed as noted. The reflux portion of the exam is  performed with the patient in reverse Trendelenburg.  +-----+---------------+---------+-----------+----------+--------------+ RIGHTCompressibilityPhasicitySpontaneityPropertiesThrombus Aging +-----+---------------+---------+-----------+----------+--------------+ CFV  Full           Yes      Yes                                 +-----+---------------+---------+-----------+----------+--------------+   +---------+---------------+---------+-----------+----------+--------------+ LEFT     CompressibilityPhasicitySpontaneityPropertiesThrombus Aging +---------+---------------+---------+-----------+----------+--------------+ CFV      Full           Yes      Yes                                 +---------+---------------+---------+-----------+----------+--------------+ SFJ      Full                                                        +---------+---------------+---------+-----------+----------+--------------+ FV Prox  Full                                                        +---------+---------------+---------+-----------+----------+--------------+ FV Mid   Full                                                        +---------+---------------+---------+-----------+----------+--------------+ FV DistalFull                                                        +---------+---------------+---------+-----------+----------+--------------+  PFV      Full                                                        +---------+---------------+---------+-----------+----------+--------------+ POP      Full           Yes      Yes                                 +---------+---------------+---------+-----------+----------+--------------+ PTV      Full                                                        +---------+---------------+---------+-----------+----------+--------------+ PERO     Full                                                         +---------+---------------+---------+-----------+----------+--------------+ There is a fluid filled appearing, nonvascularized area in the left popliteal fossa measuring 2.37 x 1.03 cm.    Summary: RIGHT: - No evidence of common femoral vein obstruction.   LEFT: - There is no evidence of deep vein thrombosis in the lower extremity.  - A cystic structure is found in the popliteal fossa.  *See table(s) above for measurements and observations.    Preliminary     Pending Labs Unresulted Labs (From admission, onward)    None       Vitals/Pain Today's Vitals   01/01/24 1042 01/01/24 1043 01/01/24 1130 01/01/24 1226  BP:   (!) 120/59 119/80  Pulse:    70  Resp:   16 16  Temp:  97.9 F (36.6 C)  97.9 F (36.6 C)  TempSrc:    Oral  SpO2:    (!) 87%  Weight:      Height:      PainSc: 5   5      Isolation Precautions No active isolations  Medications Medications  acetaminophen  (TYLENOL ) tablet 1,000 mg (1,000 mg Oral Given 01/01/24 1042)    Mobility walks with device     Focused Assessments Neuro Assessment Handoff:  Swallow screen pass? N/A Cardiac Rhythm: Normal sinus rhythm       Neuro Assessment:   Neuro Checks:      Has TPA been given? No If patient is a Neuro Trauma and patient is going to OR before floor call report to 4N Charge nurse: [phone] or [phone]   R Recommendations: See Admitting Provider Note  Report given to:   Additional Notes:

## 2024-01-01 NOTE — ED Provider Notes (Signed)
 [place] EMERGENCY DEPARTMENT AT [facility] Provider Note   CSN: [id number] Arrival date & time: 01/01/24  1008     Patient presents with: Shortness of Breath   [name] is a 68 y.o. female.   Patient is a 68 year old who presents with shortness of breath.  She has a history of COPD.  She does use nebulizer [place] at home.  She also has home oxygen to use as needed at 2 to 4 L/min.  She has been having increased shortness of breath for the last few days.  She said EMS came out yesterday but she refused to go at that point.  Her breathing got worse this morning.  Fire had noted her oxygen level to be 71% on room air.  She was given 2 DuoNeb treatments as well as Solu-Medrol  and magnesium .  Overall she is starting to feel better but still short of breath.  She also describes a heavy tight feeling across her chest.  She has a little bit of runny nose and congestion and a little bit of coughing.  No known fevers.  No leg swelling other than she has noticed recently that her left foot and lower leg has been swollen which is atypical for her.       Prior to Admission medications   Medication Sig Start Date End Date Taking? Authorizing Provider  albuterol  (PROVENTIL ) (2.5 MG/3ML) 0.083% nebulizer solution Take 2.5 mg by nebulization every 6 (six) hours as needed for wheezing or shortness of breath.    [provider]  albuterol  (VENTOLIN  HFA) 108 (90 Base) MCG/ACT inhaler Inhale 2 puffs into the lungs every 4 (four) hours as needed for wheezing or shortness of breath. 07/24/23   Kara Dorn NOVAK, MD  atorvastatin  (LIPITOR) 10 MG tablet Take 10 mg by mouth at bedtime.    [provider]  azithromycin  (ZITHROMAX ) 250 MG tablet Take 2 tablets (500 mg total) by mouth daily. 12/06/23   Akula, Vijaya, MD  benzonatate  (TESSALON ) 100 MG capsule Take 1 capsule (100 mg total) by mouth 3 (three) times daily as needed for cough. 12/06/23   Akula, Vijaya, MD   enalapril -hydrochlorothiazide  (VASERETIC ) 10-25 MG tablet Take 1 tablet by mouth daily.    [provider]  feeding supplement (ENSURE ENLIVE / ENSURE PLUS) LIQD Take 237 mLs by mouth 3 (three) times daily between meals. 09/06/23   Rizwan, Saima, MD  fluticasone -salmeterol (ADVAIR  DISKUS) 250-50 MCG/ACT AEPB Inhale 1 puff into the lungs in the morning and at bedtime. 12/06/23   Akula, Vijaya, MD  hydrOXYzine  (ATARAX ) 25 MG tablet Take 25 mg by mouth 3 (three) times daily as needed for anxiety. 09/25/23   [provider]  ipratropium-albuterol  (DUONEB) 0.5-2.5 (3) MG/3ML SOLN Take 3 mLs by nebulization every 6 (six) hours as needed. 12/06/23   Akula, Vijaya, MD  mirtazapine  (REMERON ) 7.5 MG tablet Take 7.5 mg by mouth at bedtime. 09/26/23   [provider]  Multiple Vitamin (MULTIVITAMIN) capsule Take 1 capsule by mouth daily.    [provider]  nicotine  (NICODERM CQ  - DOSED IN MG/24 HOURS) 14 mg/24hr patch Place 1 patch (14 mg total) onto the skin daily. 12/07/23   Akula, Vijaya, MD  oxyCODONE -acetaminophen  (PERCOCET) 10-325 MG tablet Take 1 tablet by mouth 4 (four) times daily. 12/23/20   [provider]  OXYGEN Inhale 3 L/min into the lungs as needed (for shortness of breath).    [provider]  polyethylene glycol (MIRALAX  / GLYCOLAX ) 17  g packet Take 17 g by mouth daily as needed for mild constipation. 05/27/23   Tobie Yetta HERO, MD  predniSONE  (DELTASONE ) 20 MG tablet Take 2 tablets by mouth daily for 3 days followed by  1 and 1/2 tablets by mouth daily for 3 days followed by  1 tablet daily by mouth daily for 3 days followed by  1/2 tablet by mouth daily for 3 days. 12/07/23   Cherlyn Labella, MD  PRESCRIPTION MEDICATION See admin instructions. BiPAP- At bedtime    [provider]  zolpidem  (AMBIEN ) 5 MG tablet Take 5 mg by mouth at bedtime. 12/25/20   [provider]    Allergies: Patient has no known allergies.    Review of  Systems  Constitutional:  Negative for chills, diaphoresis, fatigue and fever.  HENT:  Positive for congestion. Negative for rhinorrhea and sneezing.   Eyes: Negative.   Respiratory:  Positive for cough and shortness of breath. Negative for chest tightness.   Cardiovascular:  Positive for chest pain and leg swelling.  Gastrointestinal:  Negative for abdominal pain, blood in stool, diarrhea, nausea and vomiting.  Genitourinary:  Negative for difficulty urinating, flank pain, frequency and hematuria.  Musculoskeletal:  Negative for arthralgias and back pain.  Skin:  Negative for rash.  Neurological:  Negative for dizziness, speech difficulty, weakness, numbness and headaches.    Updated Vital Signs BP 119/80 (BP Location: Right Arm)   Pulse 70   Temp 97.9 F (36.6 C) (Oral)   Resp 16   Ht 5' 1.5 (1.562 m)   Wt 46 kg   SpO2 (!) 87%   BMI 18.85 kg/m   Physical Exam Constitutional:      General: She is in acute distress.     Appearance: She is well-developed.  HENT:     Head: Normocephalic and atraumatic.  Eyes:     Pupils: Pupils are equal, round, and reactive to light.  Cardiovascular:     Rate and Rhythm: Normal rate and regular rhythm.     Heart sounds: Normal heart sounds.  Pulmonary:     Effort: Tachypnea and accessory muscle usage present. No respiratory distress.     Breath sounds: Decreased breath sounds and wheezing present. No rales.  Chest:     Chest wall: No tenderness.  Abdominal:     General: Bowel sounds are normal.     Palpations: Abdomen is soft.     Tenderness: There is no abdominal tenderness. There is no guarding or rebound.  Musculoskeletal:        General: Normal range of motion.     Cervical back: Normal range of motion and neck supple.     Comments: Mild swelling to the left foot and ankle area.  There is no bony tenderness noted.  No warmth or erythema.  Pedal pulses are intact.  Lymphadenopathy:     Cervical: No cervical adenopathy.  Skin:     General: Skin is warm and dry.     Findings: No rash.  Neurological:     Mental Status: She is alert and oriented to person, place, and time.     (all labs ordered are listed, but only abnormal results are displayed) Labs Reviewed  BASIC METABOLIC PANEL WITH GFR - Abnormal; Notable for the following components:      Result Value   Chloride 96 (*)    Glucose, Bld 107 (*)    All other components within normal limits  CBC WITH DIFFERENTIAL/PLATELET - Abnormal; Notable for the  following components:   MCHC 29.8 (*)    All other components within normal limits  URINALYSIS, ROUTINE W REFLEX MICROSCOPIC - Abnormal; Notable for the following components:   Hgb urine dipstick SMALL (*)    Bacteria, UA RARE (*)    All other components within normal limits  TROPONIN I (HIGH SENSITIVITY)  TROPONIN I (HIGH SENSITIVITY)    EKG: EKG Interpretation Date/Time:  Wednesday January 01 2024 10:40:12 EDT Ventricular Rate:  77 PR Interval:  151 QRS Duration:  89 QT Interval:  388 QTC Calculation: 445 R Axis:   96  Text Interpretation: Sinus rhythm Right axis deviation since last tracing no significant change Confirmed by Lenor Hollering ([id number]) on 01/01/2024 10:49:40 AM  Radiology: [facility] Chest Port 1 View Result Date: 01/01/2024 EXAM: 1 VIEW XRAY OF THE CHEST 01/01/2024 11:00:00 AM COMPARISON: None available. CLINICAL HISTORY: SOB. Per chart - Patient to ED by EMS from home with c/o SOB. Patient states she as had symptoms for the past few days but SOB worsened today. Fire got to her home and gave albuterol  treatment due to patient O2 being 71% RA EMS arrived and gave 2 Duoneb treatments, solu medrol  and a Mag drip she came up to 100% RA. She c/o tightness in chest denies fever chills N/V. FINDINGS: LUNGS AND PLEURA: No focal pulmonary opacity. No pulmonary edema. No pleural effusion. No pneumothorax. HEART AND MEDIASTINUM: No acute abnormality of the cardiac and mediastinal silhouettes. BONES AND SOFT TISSUES:  Multilevel thoracic osteophytosis. Diffuse osteopenia. No acute osseous abnormality. IMPRESSION: 1. No acute cardiopulmonary abnormality. Electronically signed by: Rogelia Myers MD 01/01/2024 12:26 PM EDT RP Workstation: [id number]   VAS US  LOWER EXTREMITY VENOUS (DVT) (7a-7p) Result Date: 01/01/2024  Lower Venous DVT Study Patient Name:  KEYONNI PERCIVAL  Date of Exam:   01/01/2024 Medical Rec #: [id number]       Accession #:    [id number] Date of Birth: 1955-06-03       Patient Gender: F Patient Age:   88 years Exam Location:  [facility] Procedure:      VAS US  LOWER EXTREMITY VENOUS (DVT) Referring Phys: Darcia Lampi --------------------------------------------------------------------------------  Indications: Pain.  Performing Technologist: Elmarie Lindau, RVT  Examination Guidelines: A complete evaluation includes B-mode imaging, spectral Doppler, color Doppler, and power Doppler as needed of all accessible portions of each vessel. Bilateral testing is considered an integral part of a complete examination. Limited examinations for reoccurring indications may be performed as noted. The reflux portion of the exam is performed with the patient in reverse Trendelenburg.  +-----+---------------+---------+-----------+----------+--------------+ RIGHTCompressibilityPhasicitySpontaneityPropertiesThrombus Aging +-----+---------------+---------+-----------+----------+--------------+ CFV  Full           Yes      Yes                                 +-----+---------------+---------+-----------+----------+--------------+   +---------+---------------+---------+-----------+----------+--------------+ LEFT     CompressibilityPhasicitySpontaneityPropertiesThrombus Aging +---------+---------------+---------+-----------+----------+--------------+ CFV      Full           Yes      Yes                                 +---------+---------------+---------+-----------+----------+--------------+ SFJ       Full                                                        +---------+---------------+---------+-----------+----------+--------------+  FV Prox  Full                                                        +---------+---------------+---------+-----------+----------+--------------+ FV Mid   Full                                                        +---------+---------------+---------+-----------+----------+--------------+ FV DistalFull                                                        +---------+---------------+---------+-----------+----------+--------------+ PFV      Full                                                        +---------+---------------+---------+-----------+----------+--------------+ POP      Full           Yes      Yes                                 +---------+---------------+---------+-----------+----------+--------------+ PTV      Full                                                        +---------+---------------+---------+-----------+----------+--------------+ PERO     Full                                                        +---------+---------------+---------+-----------+----------+--------------+ There is a fluid filled appearing, nonvascularized area in the left popliteal fossa measuring 2.37 x 1.03 cm.    Summary: RIGHT: - No evidence of common femoral vein obstruction.   LEFT: - There is no evidence of deep vein thrombosis in the lower extremity.  - A cystic structure is found in the popliteal fossa.  *See table(s) above for measurements and observations.    Preliminary      Procedures   Medications Ordered in the ED  oxyCODONE  (Oxy IR/ROXICODONE ) immediate release tablet 10 mg (has no administration in time range)  acetaminophen  (TYLENOL ) tablet 1,000 mg (1,000 mg Oral Given 01/01/24 1042)                                    Medical Decision Making Amount and/or Complexity of Data Reviewed Labs:  ordered. Radiology: ordered.  Risk OTC drugs. Prescription drug management. Decision regarding hospitalization.   This patient presents to the  ED for concern of shortness of breath, this involves an extensive number of treatment options, and is a complaint that carries with it a high risk of complications and morbidity.  I considered the following differential and admission for this acute, potentially life threatening condition.  The differential diagnosis includes COPD exacerbation, pneumonia, ACS, pulmonary edema, PE  MDM:    Patient is a 68 year old who presents with wheezing and shortness of breath.  She has associated chest discomfort.  Chest x-ray does not show any evidence of pneumonia.  Troponin is negative.  She does not have pleuritic pain or other symptoms that would be more concerning for PE.  She did have a little bit of swelling in her left foot although no calf tenderness or significant swelling in the leg.  Ultrasound was done which was negative for DVT.  She did get some nebulizer treatments here as well as Solu-Medrol  and magnesium  by EMS.  She is overall breathing better but still has some wheezing.  She was hypoxic down to the 70s off oxygen.  She says she has home oxygen to use but sounds like she does not use it chronically, only as needed.  Given her ongoing wheezing and hypoxia, will plan admission.  Discussed with Dr. Sonjia who will admit the patient.  I was called to patient's room because she was mad and said she wanted to leave.  When I asked her why she said you told me everything was fine.  I explained to her that I had said previously that her chest x-ray did not show any evidence of pneumonia and that her labs looked okay but she still need to be admitted for her ongoing symptoms.  She was very argumentative and wanted to leave.  I said she would have to leave AMA and she said that she would sign out AMA.  She then changed her mind and wanted to speak with me again.   She said that she would stay.  Notified the hospitalist.  CRITICAL CARE Performed by: Andrea Ness Total critical care time: 70 minutes Critical care time was exclusive of separately billable procedures and treating other patients. Critical care was necessary to treat or prevent imminent or life-threatening deterioration. Critical care was time spent personally by me on the following activities: development of treatment plan with patient and/or surrogate as well as nursing, discussions with consultants, evaluation of patient's response to treatment, examination of patient, obtaining history from patient or surrogate, ordering and performing treatments and interventions, ordering and review of laboratory studies, ordering and review of radiographic studies, pulse oximetry and re-evaluation of patient's condition.   (Labs, imaging, consults)  Labs: I Ordered, and personally interpreted labs.  The pertinent results include: Normal troponins, no significant anemia  Imaging Studies ordered: I ordered imaging studies including chest x-ray I independently visualized and interpreted imaging. I agree with the radiologist interpretation  Additional history obtained from chart.  External records from outside source obtained and reviewed including iron notes  Cardiac Monitoring: The patient was maintained on a cardiac monitor.  If on the cardiac monitor, I personally viewed and interpreted the cardiac monitored which showed an underlying rhythm of: This rhythm  Reevaluation: After the interventions noted above, I reevaluated the patient and found that they have :improved  Social Determinants of Health:    Disposition: Admit to hospital  Co morbidities that complicate the patient evaluation  Past Medical History:  Diagnosis Date   Arthritis    Asthma    Chest pain  2006   Chest pain 10/2016   Chronic back pain    Hypertension    Sciatica    Shortness of breath    due to  smoking       Medicines Meds ordered this encounter  Medications   acetaminophen  (TYLENOL ) tablet 1,000 mg   oxyCODONE  (Oxy IR/ROXICODONE ) immediate release tablet 10 mg    Refill:  0   enoxaparin  (LOVENOX ) injection 40 mg   sodium chloride  flush (NS) 0.9 % injection 3 mL   sodium chloride  flush (NS) 0.9 % injection 3 mL   0.9 %  sodium chloride  infusion   OR Linked Order Group    acetaminophen  (TYLENOL ) tablet 650 mg    acetaminophen  (TYLENOL ) suppository 650 mg   polyethylene glycol (MIRALAX  / GLYCOLAX ) packet 17 g   OR Linked Order Group    ondansetron  (ZOFRAN ) tablet 4 mg    ondansetron  (ZOFRAN ) injection 4 mg   hydrALAZINE  (APRESOLINE ) injection 5 mg   doxycycline  (VIBRA -TABS) tablet 100 mg   FOLLOWED BY Linked Order Group    methylPREDNISolone  sodium succinate (SOLU-MEDROL ) 40 mg/mL injection 40 mg     IV methylprednisolone  will be converted to either a q12h or q24h frequency with the same total daily dose (TDD).  Ordered Dose: 1 to 125 mg TDD; convert to: TDD q24h.  Ordered Dose: 126 to 250 mg TDD; convert to: TDD div q12h.  Ordered Dose: >250 mg TDD; DAW.    predniSONE  (DELTASONE ) tablet 40 mg   fluticasone  furoate-vilanterol (BREO ELLIPTA ) 100-25 MCG/ACT 1 puff   ipratropium-albuterol  (DUONEB) 0.5-2.5 (3) MG/3ML nebulizer solution 3 mL    I have reviewed the patients home medicines and have made adjustments as needed  Problem List / ED Course: Problem List Items Addressed This Visit       Respiratory   COPD exacerbation (HCC) - Primary   Relevant Medications   methylPREDNISolone  sodium succinate (SOLU-MEDROL ) 40 mg/mL injection 40 mg (Start on 01/01/2024  2:15 PM)   predniSONE  (DELTASONE ) tablet 40 mg (Start on 01/02/2024 10:00 AM)   fluticasone  furoate-vilanterol (BREO ELLIPTA ) 100-25 MCG/ACT 1 puff (Start on 01/01/2024  2:15 PM)   ipratropium-albuterol  (DUONEB) 0.5-2.5 (3) MG/3ML nebulizer solution 3 mL (Start on 01/01/2024  2:15 PM)             Final diagnoses:  COPD  exacerbation [facility])    ED Discharge Orders     None          Lenor Hollering, MD 01/01/24 1415

## 2024-01-02 ENCOUNTER — Other Ambulatory Visit (HOSPITAL_COMMUNITY): Payer: Self-pay

## 2024-01-02 ENCOUNTER — Encounter (HOSPITAL_COMMUNITY): Payer: Self-pay | Admitting: Internal Medicine

## 2024-01-02 DIAGNOSIS — J441 Chronic obstructive pulmonary disease with (acute) exacerbation: Secondary | ICD-10-CM | POA: Diagnosis not present

## 2024-01-02 LAB — CBC
HCT: 34.1 % — ABNORMAL LOW (ref 36.0–46.0)
Hemoglobin: 10.5 g/dL — ABNORMAL LOW (ref 12.0–15.0)
MCH: 29.4 pg (ref 26.0–34.0)
MCHC: 30.8 g/dL (ref 30.0–36.0)
MCV: 95.5 fL (ref 80.0–100.0)
Platelets: 254 K/uL (ref 150–400)
RBC: 3.57 MIL/uL — ABNORMAL LOW (ref 3.87–5.11)
RDW: 14.4 % (ref 11.5–15.5)
WBC: 7.6 K/uL (ref 4.0–10.5)
nRBC: 0 % (ref 0.0–0.2)

## 2024-01-02 LAB — BASIC METABOLIC PANEL WITH GFR
Anion gap: 8 (ref 5–15)
BUN: 16 mg/dL (ref 8–23)
CO2: 33 mmol/L — ABNORMAL HIGH (ref 22–32)
Calcium: 8.9 mg/dL (ref 8.9–10.3)
Chloride: 95 mmol/L — ABNORMAL LOW (ref 98–111)
Creatinine, Ser: 0.57 mg/dL (ref 0.44–1.00)
GFR, Estimated: >60 mL/min (ref >60)
Glucose, Bld: 134 mg/dL — ABNORMAL HIGH (ref 70–99)
Potassium: 4.1 mmol/L (ref 3.5–5.1)
Sodium: 136 mmol/L (ref 135–145)

## 2024-01-02 LAB — PROTIME-INR
INR: 0.9 (ref 0.8–1.2)
Prothrombin Time: 13.1 s (ref 11.4–15.2)

## 2024-01-02 MED ORDER — PREDNISONE 20 MG PO TABS: ORAL_TABLET | ORAL | 0 refills | Status: DC

## 2024-01-02 MED ORDER — PREDNISONE 20 MG PO TABS
ORAL_TABLET | ORAL | 0 refills | Status: DC
  Filled 2024-01-02: qty 18, 12d supply, fill #0

## 2024-01-02 MED ORDER — FLUTICASONE-SALMETEROL 250-50 MCG/ACT IN AEPB: 1.0000 | INHALATION_SPRAY | Freq: Two times a day (BID) | RESPIRATORY_TRACT | 0 refills | Status: DC

## 2024-01-02 MED ORDER — BENZONATATE 100 MG PO CAPS: 100.0000 mg | ORAL_CAPSULE | Freq: Three times a day (TID) | ORAL | 0 refills | Status: DC | PRN

## 2024-01-02 MED ORDER — BENZONATATE 100 MG PO CAPS
100.0000 mg | ORAL_CAPSULE | Freq: Three times a day (TID) | ORAL | 0 refills | Status: DC | PRN
  Filled 2024-01-02: qty 20, 7d supply, fill #0

## 2024-01-02 MED ORDER — DOXYCYCLINE HYCLATE 100 MG PO TABS
100.0000 mg | ORAL_TABLET | Freq: Two times a day (BID) | ORAL | 0 refills | Status: AC
  Filled 2024-01-02: qty 4, 2d supply, fill #0

## 2024-01-02 MED ORDER — DOXYCYCLINE HYCLATE 100 MG PO TABS: 100.0000 mg | ORAL_TABLET | Freq: Two times a day (BID) | ORAL | 0 refills | Status: DC

## 2024-01-02 MED ORDER — FLUTICASONE-SALMETEROL 250-50 MCG/ACT IN AEPB
1.0000 | INHALATION_SPRAY | Freq: Two times a day (BID) | RESPIRATORY_TRACT | 0 refills | Status: DC
  Filled 2024-01-02: qty 60, 30d supply, fill #0

## 2024-01-02 NOTE — Plan of Care (Signed)
  Problem: Education: Goal: Knowledge of General Education information will improve Description: Including pain rating scale, medication(s)/side effects and non-pharmacologic comfort measures 01/02/2024 1243 by Alaina Dozier PARAS, RN Outcome: Adequate for Discharge 01/02/2024 [phone] by Alaina Dozier PARAS, RN Outcome: Progressing   Problem: Health Behavior/Discharge Planning: Goal: Ability to manage health-related needs will improve 01/02/2024 1243 by Alaina Dozier PARAS, RN Outcome: Adequate for Discharge 01/02/2024 0738 by Alaina Dozier PARAS, RN Outcome: Progressing   Problem: Clinical Measurements: Goal: Ability to maintain clinical measurements within normal limits will improve 01/02/2024 1243 by Alaina Dozier PARAS, RN Outcome: Adequate for Discharge 01/02/2024 [phone] by Alaina Dozier PARAS, RN Outcome: Progressing Goal: Will remain free from infection 01/02/2024 1243 by Alaina Dozier PARAS, RN Outcome: Adequate for Discharge 01/02/2024 [phone] by Alaina Dozier PARAS, RN Outcome: Progressing Goal: Diagnostic test results will improve 01/02/2024 1243 by Alaina Dozier PARAS, RN Outcome: Adequate for Discharge 01/02/[phone] by Alaina Dozier PARAS, RN Outcome: Progressing Goal: Respiratory complications will improve Outcome: Adequate for Discharge Goal: Cardiovascular complication will be avoided Outcome: Adequate for Discharge   Problem: Activity: Goal: Risk for activity intolerance will decrease Outcome: Adequate for Discharge   Problem: Nutrition: Goal: Adequate nutrition will be maintained Outcome: Adequate for Discharge   Problem: Coping: Goal: Level of anxiety will decrease Outcome: Adequate for Discharge   Problem: Elimination: Goal: Will not experience complications related to bowel motility Outcome: Adequate for Discharge Goal: Will not experience complications related to urinary retention Outcome: Adequate for Discharge   Problem: Pain Managment: Goal:  General experience of comfort will improve and/or be controlled Outcome: Adequate for Discharge   Problem: Safety: Goal: Ability to remain free from injury will improve Outcome: Adequate for Discharge   Problem: Skin Integrity: Goal: Risk for impaired skin integrity will decrease Outcome: Adequate for Discharge   Problem: Education: Goal: Knowledge of disease or condition will improve Outcome: Adequate for Discharge Goal: Knowledge of the prescribed therapeutic regimen will improve Outcome: Adequate for Discharge Goal: Individualized Educational Video(s) Outcome: Adequate for Discharge   Problem: Activity: Goal: Ability to tolerate increased activity will improve Outcome: Adequate for Discharge Goal: Will verbalize the importance of balancing activity with adequate rest periods Outcome: Adequate for Discharge   Problem: Respiratory: Goal: Ability to maintain a clear airway will improve Outcome: Adequate for Discharge Goal: Levels of oxygenation will improve Outcome: Adequate for Discharge Goal: Ability to maintain adequate ventilation will improve Outcome: Adequate for Discharge

## 2024-01-02 NOTE — Discharge Summary (Signed)
 Physician Discharge Summary  [name] FMW:[id number] DOB: Oct 01, 1955 DOA: 01/01/2024  PCP: [name] [name], NP  Admit date: 01/01/2024 Discharge date: 01/02/2024  Admitted From: Home  Discharge disposition: Home with home health   Recommendations for Outpatient Follow-Up:   Follow up with your primary care provider in one week.  Check CBC, BMP, magnesium  in the next visit Patient has been advised oxygen on discharge, continuous basis.  TSH in the next visit.  Discharge Diagnosis:   Principal Problem:   Acute exacerbation of chronic obstructive pulmonary disease (COPD) (HCC) Active Problems:   Chronic hypoxic respiratory failure (HCC)   Essential hypertension   Gastroesophageal reflux disease   Discharge Condition: Improved.  Diet recommendation:   Regular.  Wound care: None.  Code status: Full.   History of Present Illness:   Patient is a 68 years old female with past medical history of COPD, anxiety, hypertension on as needed oxygen 2 to 4 L presented to the hospital with increasing shortness of breath for last few days.  EMS noted that her pulse ox was 77% on room air and was given 2 DuoNebs and Solu-Medrol  including magnesium  and was brought into the hospital. In the ED patient was noted to be in acute distress with tachypnea, accessory muscle usage decreased breath sounds and wheezing with left foot and ankle area swelling.  Initial vitals were notable for blood pressure of 127/77.  Pulse ox was 87% on nasal cannula in the ED.  Patient was afebrile.  Initial labs showed normal CBC.  BMP was notable for glucose slightly elevated at 107.  Urinalysis negative for Infection.  Chest x-ray showed no acute infiltrate.  Patient was then considered for admission to the hospital for further evaluation and treatment.   Hospital Course:   Following conditions were addressed during hospitalization as listed below,  Acute COPD exacerbation.  During hospitalization patient  received nebulizer oxygen IV steroids and bronchodilators.  Will be transition to tapering prednisone  and empiric doxycycline  on discharge.  States that she has bronchodilators and inhalers at home.  Chest x-ray without any obvious infiltrate.  Advised to continue oxygen at home.   Chronic hypoxic respiratory failure.  Patient is intermittently using oxygen 2 to 4 L at home.  Was hypoxic on presentation and is currently on nasal cannula oxygen.  Patient has qualified for 4 L of oxygen on discharge and was emphasized the need for compliance to it.   Essential hypertension Continue enalapril  HCTZ from home.  Will resume on discharge.   Hyperlipidemia.  Continue Lipitor.   History of nicotine  dependence.  States that she does not smoke anymore but wishes to be a nicotine  patch.  Will continue nicotine  patch on discharge.  Deconditioning debility.  Will get home health PT OT RN on discharge.   Disposition.  At this time, patient is stable for disposition home with outpatient PCP follow-up.  Medical Consultants:   None.  Procedures:    None Subjective:   Today, patient was seen and examined at bedside.  Feels better than yesterday except for mild wheezing.  Denies chest pain  nausea, vomiting, fever, chills or rigor.  Discharge Exam:   Vitals:   01/02/24 1126 01/02/24 1242  BP: 131/80   Pulse: 74   Resp: 18   Temp: 98.6 F (37 C)   SpO2: 100% 98%   Vitals:   01/02/24 0500 01/02/24 0547 01/02/24 1126 01/02/24 1242  BP:  123/85 131/80   Pulse:  85 74  Resp:  18 18   Temp:  98.5 F (36.9 C) 98.6 F (37 C)   TempSrc:  Oral Oral   SpO2:  100% 100% 98%  Weight: 42.2 kg     Height:       Body mass index is 17.29 kg/m.  General: Alert awake, not in obvious distress, thinly built, on nasal cannula oxygen HENT: pupils equally reacting to light,  No scleral pallor or icterus noted. Oral mucosa is moist.  Chest:  Diminished breath sounds bilaterally.  Mild wheezes noted CVS:  S1 &S2 heard. No murmur.  Regular rate and rhythm. Abdomen: Soft, nontender, nondistended.  Bowel sounds are heard.   Extremities: No cyanosis, clubbing or edema.  Peripheral pulses are palpable. Psych: Alert, awake and oriented, normal mood CNS:  No cranial nerve deficits.  Power equal in all extremities.   Skin: Warm and dry.  No rashes noted.  The results of significant diagnostics from this hospitalization (including imaging, microbiology, ancillary and laboratory) are listed below for reference.     Diagnostic Studies:   VAS US  LOWER EXTREMITY VENOUS (DVT) (7a-7p) Result Date: 01/01/2024  Lower Venous DVT Study Patient Name:  [name]  Date of Exam:   01/01/2024 Medical Rec #: [id number]       Accession #:    [id number] Date of Birth: 1956/04/08       Patient Gender: F Patient Age:   27 years Exam Location:  [facility] Procedure:      VAS US  LOWER EXTREMITY VENOUS (DVT) Referring Phys: MELANIE BELFI --------------------------------------------------------------------------------  Indications: Pain.  Performing Technologist: Elmarie Lindau, RVT  Examination Guidelines: A complete evaluation includes B-mode imaging, spectral Doppler, color Doppler, and power Doppler as needed of all accessible portions of each vessel. Bilateral testing is considered an integral part of a complete examination. Limited examinations for reoccurring indications may be performed as noted. The reflux portion of the exam is performed with the patient in reverse Trendelenburg.  +-----+---------------+---------+-----------+----------+--------------+ RIGHTCompressibilityPhasicitySpontaneityPropertiesThrombus Aging +-----+---------------+---------+-----------+----------+--------------+ CFV  Full           Yes      Yes                                 +-----+---------------+---------+-----------+----------+--------------+   +---------+---------------+---------+-----------+----------+--------------+ LEFT      CompressibilityPhasicitySpontaneityPropertiesThrombus Aging +---------+---------------+---------+-----------+----------+--------------+ CFV      Full           Yes      Yes                                 +---------+---------------+---------+-----------+----------+--------------+ SFJ      Full                                                        +---------+---------------+---------+-----------+----------+--------------+ FV Prox  Full                                                        +---------+---------------+---------+-----------+----------+--------------+ FV Mid   Full                                                        +---------+---------------+---------+-----------+----------+--------------+  FV DistalFull                                                        +---------+---------------+---------+-----------+----------+--------------+ PFV      Full                                                        +---------+---------------+---------+-----------+----------+--------------+ POP      Full           Yes      Yes                                 +---------+---------------+---------+-----------+----------+--------------+ PTV      Full                                                        +---------+---------------+---------+-----------+----------+--------------+ PERO     Full                                                        +---------+---------------+---------+-----------+----------+--------------+ There is a fluid filled appearing, nonvascularized area in the left popliteal fossa measuring 2.37 x 1.03 cm.    Summary: RIGHT: - No evidence of common femoral vein obstruction.   LEFT: - There is no evidence of deep vein thrombosis in the lower extremity.  - A cystic structure is found in the popliteal fossa.  *See table(s) above for measurements and observations. Electronically signed by Gaile New MD on 01/01/2024 at 6:10:50 PM.     Final    DG Chest Port 1 View Result Date: 01/01/2024 EXAM: 1 VIEW XRAY OF THE CHEST 01/01/2024 11:00:00 AM COMPARISON: None available. CLINICAL HISTORY: SOB. Per chart - Patient to ED by EMS from home with c/o SOB. Patient states she as had symptoms for the past few days but SOB worsened today. Fire got to her home and gave albuterol  treatment due to patient O2 being 71% RA EMS arrived and gave 2 Duoneb treatments, solu medrol  and a Mag drip she came up to 100% RA. She c/o tightness in chest denies fever chills N/V. FINDINGS: LUNGS AND PLEURA: No focal pulmonary opacity. No pulmonary edema. No pleural effusion. No pneumothorax. HEART AND MEDIASTINUM: No acute abnormality of the cardiac and mediastinal silhouettes. BONES AND SOFT TISSUES: Multilevel thoracic osteophytosis. Diffuse osteopenia. No acute osseous abnormality. IMPRESSION: 1. No acute cardiopulmonary abnormality. Electronically signed by: Rogelia Myers MD 01/01/2024 12:26 PM EDT RP Workstation: [id number]     Labs:   Basic Metabolic Panel: Recent Labs  Lab 01/01/24 1033 01/02/24 0522  NA 139 136  K 3.6 4.1  CL 96* 95*  CO2 32 33*  GLUCOSE 107* 134*  BUN 10 16  CREATININE 0.69 0.57  CALCIUM  9.1 8.9   GFR Estimated Creatinine Clearance:  43.6 mL/min (by C-G formula based on SCr of 0.57 mg/dL). Liver Function Tests: No results for input(s): AST, ALT, ALKPHOS, BILITOT, PROT, ALBUMIN in the last 168 hours. No results for input(s): LIPASE, AMYLASE in the last 168 hours. No results for input(s): AMMONIA in the last 168 hours. Coagulation profile Recent Labs  Lab 01/02/24 0522  INR 0.9    CBC: Recent Labs  Lab 01/01/24 1033 01/02/24 0522  WBC 7.3 7.6  NEUTROABS 4.3  --   HGB 12.1 10.5*  HCT 40.6 34.1*  MCV 97.6 95.5  PLT 264 254   Cardiac Enzymes: No results for input(s): CKTOTAL, CKMB, CKMBINDEX, TROPONINI in the last 168 hours. BNP: Invalid input(s): POCBNP CBG: No results for  input(s): GLUCAP in the last 168 hours. D-Dimer No results for input(s): DDIMER in the last 72 hours. Hgb A1c No results for input(s): HGBA1C in the last 72 hours. Lipid Profile No results for input(s): CHOL, HDL, LDLCALC, TRIG, CHOLHDL, LDLDIRECT in the last 72 hours. Thyroid  function studies No results for input(s): TSH, T4TOTAL, T3FREE, THYROIDAB in the last 72 hours.  Invalid input(s): FREET3 Anemia work up No results for input(s): VITAMINB12, FOLATE, FERRITIN, TIBC, IRON, RETICCTPCT in the last 72 hours. Microbiology No results found for this or any previous visit (from the past 240 hours).   Discharge Instructions:   Discharge Instructions     Diet general   Complete by: As directed    Discharge instructions   Complete by: As directed    Follow-up with your primary care provider in 1 week.  Continue medications as prescribed.  Seek medical attention for worsening symptoms.  Do not overexert.  Continue oxygen at home.   Increase activity slowly   Complete by: As directed       Allergies as of 01/02/2024   No Known Allergies      Medication List     STOP taking these medications    azithromycin  250 MG tablet Commonly known as: ZITHROMAX        TAKE these medications    albuterol  (2.5 MG/3ML) 0.083% nebulizer solution Commonly known as: PROVENTIL  Take 2.5 mg by nebulization every 6 (six) hours as needed for wheezing or shortness of breath.   albuterol  108 (90 Base) MCG/ACT inhaler Commonly known as: VENTOLIN  HFA Inhale 2 puffs into the lungs every 4 (four) hours as needed for wheezing or shortness of breath.   atorvastatin  10 MG tablet Commonly known as: LIPITOR Take 10 mg by mouth at bedtime.   benzonatate  100 MG capsule Commonly known as: TESSALON  Take 1 capsule (100 mg total) by mouth 3 (three) times daily as needed for cough.   doxycycline  100 MG tablet Commonly known as: VIBRA -TABS Take 1 tablet (100 mg  total) by mouth every 12 (twelve) hours for 2 days.   enalapril -hydrochlorothiazide  10-25 MG tablet Commonly known as: VASERETIC  Take 1 tablet by mouth in the morning.   famotidine  40 MG tablet Commonly known as: PEPCID  Take 40 mg by mouth daily as needed for heartburn or indigestion.   feeding supplement (OSMOLITE 1.2 CAL) Liqd Take 237 mLs by mouth 3 (three) times daily between meals.   fluticasone -salmeterol 250-50 MCG/ACT Aepb Commonly known as: Advair  Diskus Inhale 1 puff into the lungs in the morning and at bedtime.   hydrOXYzine  25 MG tablet Commonly known as: ATARAX  Take 25 mg by mouth 3 (three) times daily as needed for anxiety.   ipratropium-albuterol  0.5-2.5 (3) MG/3ML Soln Commonly known as: DUONEB Take 3 mLs by  nebulization every 6 (six) hours as needed. What changed: when to take this   mirtazapine  7.5 MG tablet Commonly known as: REMERON  Take 7.5 mg by mouth at bedtime.   multivitamin capsule Take 1 capsule by mouth daily with breakfast.   nicotine  7 mg/24hr patch Commonly known as: NICODERM CQ  - dosed in mg/24 hr Place 7 mg onto the skin See admin instructions. Apply 1 new patch to the skin once a day for smoking cessation while hospitalized What changed: Another medication with the same name was removed. Continue taking this medication, and follow the directions you see here.   oxyCODONE -acetaminophen  10-325 MG tablet Commonly known as: PERCOCET Take 1 tablet by mouth See admin instructions. Take 1 tablet by mouth every 4-5 hours   OXYGEN Inhale 3 L/min into the lungs as needed (for shortness of breath).   polyethylene glycol 17 g packet Commonly known as: MIRALAX  / GLYCOLAX  Take 17 g by mouth daily as needed for mild constipation.   predniSONE  20 MG tablet Commonly known as: DELTASONE  Take 2 tablets by mouth daily for 3 days followed by 1 and 1/2 tablets by mouth daily for 3 days followed by 1 tablet daily by mouth daily for 3 days followed by 1/2  tablet by mouth daily for 3 days. What changed: additional instructions   PRESCRIPTION MEDICATION See admin instructions. CPAP- At bedtime   zolpidem  5 MG tablet Commonly known as: AMBIEN  Take 5 mg by mouth at bedtime.               Durable Medical Equipment  (From admission, onward)           Start     Ordered   01/02/24 1245  For home use only DME oxygen  Once       Question Answer Comment  Length of Need Lifetime   Mode or (Route) Nasal cannula   Liters per Minute 4   Frequency Continuous (stationary and portable oxygen unit needed)   Oxygen conserving device Yes   Oxygen delivery system Gas      01/02/24 1244            Follow-up Information     [name] [name], NP Follow up in 1 week(s).   Specialty: Nurse Practitioner Contact information: [address] [place] [address] [phone]                  Time coordinating discharge: 39 minutes  Signed:  Talah Cookston  [facility] 01/02/2024, 1:02 PM

## 2024-01-02 NOTE — Progress Notes (Addendum)
 SATURATION QUALIFICATIONS: (This note is used to comply with regulatory documentation for home oxygen)  Patient Saturations on Room Air at Rest = 87%  Patient Saturations on Room Air while Ambulating = 85%  Patient Saturations on 4 Liters of oxygen while Ambulating = 98 %  Please briefly explain why patient needs home oxygen:  COPD

## 2024-01-02 NOTE — Plan of Care (Signed)
   Problem: Health Behavior/Discharge Planning: Goal: Ability to manage health-related needs will improve Outcome: Progressing   Problem: Clinical Measurements: Goal: Ability to maintain clinical measurements within normal limits will improve Outcome: Progressing   Problem: Activity: Goal: Risk for activity intolerance will decrease Outcome: Progressing   Problem: Nutrition: Goal: Adequate nutrition will be maintained Outcome: Progressing

## 2024-01-02 NOTE — Care Management Obs Status (Signed)
 MEDICARE OBSERVATION STATUS NOTIFICATION   Patient Details  Name: [name] MRN: [id number] Date of Birth: Nov 10, 1955   Medicare Observation Status Notification Given:  Yes    Sonda Manuella Quill, RN 01/02/2024, 10:49 AM

## 2024-01-02 NOTE — TOC Initial Note (Addendum)
 Transition of Care [facility]) - Initial/Assessment Note    Patient Details  Name: [name] MRN: [id number] Date of Birth: 1955/08/18  Transition of [facility]) CM/SW Contact:    Sonda Manuella Quill, RN Phone Number: 01/02/2024, 11:51 AM  Clinical Narrative:                 TOC for d/c planning; spoke w/ pt in room; pt said she lives at home; she plans to return at d/c; she identified POCs Thersia Fu (niece) [phone] / Loa Nora (sister) [phone]; pt will try to arrange transport, but if unable, she will need assist; pt said she does not have money for taxi; she verified insurance/PCP; pt said she uses food pantries, and she uses transport from [employer] office to get; she denied other SDOH risks; pt said she has HHPT/OT/ and RN from [place]; she also has home oxygen from [employer]; pt said she does not have travel tank; pt agreed to receive resources for transportation, financial assistance, and social services; resources placed in follow up provider section of d/c instructions; copies also pt; she will make appt at agencies of choice; Jon at [facility] notified; also notified Zach at [employer];she said travel tank will be delivered to room; Darlyn also said new orders will be needed; Dr Verlene notified; awaiting orders.  Expected Discharge Plan: Home w Home Health Services Barriers to Discharge: Continued Medical Work up   Patient Goals and CMS Choice Patient states their goals for this hospitalization and ongoing recovery are:: home CMS Medicare.gov Compare Post Acute Care list provided to:: Patient   [place] ownership interest in [facility].provided to:: Patient    Expected Discharge Plan and Services   Discharge Planning Services: CM Consult   Living arrangements for the past 2 months: Apartment                                      Prior Living Arrangements/Services Living arrangements for the past 2 months: Apartment Lives with:: Self Patient  language and need for interpreter reviewed:: Yes Do you feel safe going back to the place where you live?: Yes      Need for Family Participation in Patient Care: Yes (Comment) Care giver support system in place?: Yes (comment) Current home services: DME, Home PT, Home OT (HHPT/OT w/ [facility]; home oxygen w/ [employer]) Criminal Activity/Legal Involvement Pertinent to Current Situation/Hospitalization: No - Comment as needed  Activities of Daily Living   ADL Screening (condition at time of admission) Independently performs ADLs?: Yes (appropriate for developmental age) Is the patient deaf or have difficulty hearing?: No Does the patient have difficulty seeing, even when wearing glasses/contacts?: No Does the patient have difficulty concentrating, remembering, or making decisions?: No  Permission Sought/Granted Permission sought to share information with : Case Manager Permission granted to share information with : Yes, Release of Information Signed  Share Information with NAME: Case Manager     Permission granted to share info w Relationship: Thersia Fu (niece) [phone] / Loa Nora (sister) [phone]     Emotional Assessment Appearance:: Appears stated age Attitude/Demeanor/Rapport: Gracious Affect (typically observed): Accepting Orientation: : Oriented to Self, Oriented to Place, Oriented to  Time, Oriented to Situation Alcohol / Substance Use: Not Applicable Psych Involvement: No (comment)  Admission diagnosis:  Acute exacerbation of chronic obstructive pulmonary disease (COPD) (HCC) [J44.1] COPD exacerbation (HCC) [J44.1] Patient Active Problem List   Diagnosis  Date Noted   Chronic hypoxic respiratory failure (HCC) 12/02/2023   Continuous dependence on cigarette smoking 12/02/2023   Generalized anxiety disorder 12/02/2023   Acute exacerbation of chronic obstructive pulmonary disease (COPD) (HCC) 12/02/2023   Acute respiratory failure with hypoxia (HCC) 10/11/2023    Rectal bleeding 10/11/2023   Acute on chronic hypoxic respiratory failure (HCC) 09/06/2023   Wheezing 05/24/2023   COPD exacerbation (HCC) 05/22/2023   Precordial chest pain 03/31/2023   Centrilobular emphysema (HCC) 12/13/2022   Palliative care by specialist 10/10/2022   Goals of care, counseling/discussion 10/10/2022   Vocal cord dysfunction 10/08/2022   Anxiety 10/08/2022   Protein-calorie malnutrition, severe 10/04/2022   COPD with acute exacerbation (HCC) 10/02/2022   Grief 06/03/2022   Pain of left upper extremity 12/28/2016   Elevated TSH 11/08/2016   Gastroesophageal reflux disease 11/08/2016   Constipation 11/08/2016   COPD (chronic obstructive pulmonary disease) (HCC) 11/08/2016   Tobacco use 11/08/2016   Non-cardiac chest pain 11/07/2016   Unstable angina (HCC) 01/07/2016   Essential hypertension 01/07/2016   Hypokalemia 11/06/2013   Acute blood loss anemia 11/06/2013   Osteoarthritis of left hip 11/05/2013   History of total left hip replacement 11/05/2013   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   PCP:  [name] [name], NP Pharmacy:   [facility] [phone] - [place], [place] - [address] AT [facility] [address] [place] [address] Phone: [phone] Fax: [phone]     Social Drivers of Health (SDOH) Social History: SDOH Screenings   Food Insecurity: Food Insecurity Present (01/02/2024)  Housing: Low Risk  (01/02/2024)  Transportation Needs: No Transportation Needs (01/02/2024)  Utilities: Not At Risk (01/02/2024)  Depression (PHQ2-9): Low Risk  (10/05/2020)  Financial Resource Strain: Low Risk  (10/05/2020)  Social Connections: Moderately Isolated (01/02/2024)  Stress: No Stress Concern Present (10/05/2020)  Tobacco Use: High Risk (01/02/2024)   SDOH Interventions: Food Insecurity Interventions: [employer] Provided, Inpatient TOC Housing Interventions:  Intervention Not Indicated, Inpatient TOC Transportation Interventions: Intervention Not Indicated, Inpatient TOC Utilities Interventions: Intervention Not Indicated, Inpatient TOC   Readmission Risk Interventions    12/04/2023    3:15 PM 10/04/2022    9:50 AM  Readmission Risk Prevention Plan  Post Dischage Appt  Complete  Medication Screening  Complete  Transportation Screening Complete Complete  PCP or Specialist Appt within 3-5 Days Complete   HRI or Home Care Consult Complete   Social Work Consult for Recovery Care Planning/Counseling Complete   Palliative Care Screening Not Applicable   Medication Review Oceanographer) Complete

## 2024-01-02 NOTE — Progress Notes (Signed)
 Discharge meds in a secure bag delivered to pt in room by this RN

## 2024-01-02 NOTE — TOC Transition Note (Signed)
 Transition of Care [facility]) - Discharge Note   Patient Details  Name: [name] MRN: [id number] Date of Birth: 11-Nov-1955  Transition of Care [facility]) CM/SW Contact:  Sonda Manuella Quill, RN Phone Number: 01/02/2024, 2:05 PM   Clinical Narrative:    D/C orders received; pt says she needs transport home; [employer] and Release of Liability signed, and placed in shadow chart; spoke w/ Merlynn Louder at [employer]; pt to be picked up at [facility] main entrance; no TOC needs; no TOC needs.   Final next level of care: Home w Home Health Services Barriers to Discharge: No Barriers Identified   Patient Goals and CMS Choice Patient states their goals for this hospitalization and ongoing recovery are:: home CMS Medicare.gov Compare Post Acute Care list provided to:: Patient   [place] ownership interest in [facility].provided to:: Patient    Discharge Placement                       Discharge Plan and Services Additional resources added to the After Visit Summary for     Discharge Planning Services: CM Consult                                 Social Drivers of Health (SDOH) Interventions SDOH Screenings   Food Insecurity: Food Insecurity Present (01/02/2024)  Housing: Low Risk  (01/02/2024)  Transportation Needs: No Transportation Needs (01/02/2024)  Utilities: Not At Risk (01/02/2024)  Depression (PHQ2-9): Low Risk  (10/05/2020)  Financial Resource Strain: Low Risk  (10/05/2020)  Social Connections: Moderately Isolated (01/02/2024)  Stress: No Stress Concern Present (10/05/2020)  Tobacco Use: High Risk (01/02/2024)     Readmission Risk Interventions    12/04/2023    3:15 PM 10/04/2022    9:50 AM  Readmission Risk Prevention Plan  Post Dischage Appt  Complete  Medication Screening  Complete  Transportation Screening Complete Complete  PCP or Specialist Appt within 3-5 Days Complete   HRI or Home Care Consult Complete   Social Work Consult for  Recovery Care Planning/Counseling Complete   Palliative Care Screening Not Applicable   Medication Review Oceanographer) Complete

## 2024-01-02 NOTE — Progress Notes (Addendum)
 Cares clustered through the shift. Rounding completed per unit protocol. Patient pleasant and expressing thanks to this RN.   Nurse tech stated that multiple times during the night when answering the call light, patient stated you must be here because the nurse doesn't want to help me. Nurse tech explained that we work collaboratively as a Administrator, Civil Service.   Market researcher has been in the patient's room multiple times through the night, and when asking patient if she has any concerns or needs the patient has not  verbalized any concerns regarding her care and has been very appreciative.   Patient very forgetful and will intermittently be very abrasive and rude towards staff. Patient has not slept much this shift, <3 hours.  Will continue to cluster cares and encourage other to complete cares in pairs.

## 2024-01-02 NOTE — Plan of Care (Signed)

## 2024-01-21 ENCOUNTER — Encounter (HOSPITAL_COMMUNITY): Payer: Self-pay | Admitting: Emergency Medicine

## 2024-01-21 ENCOUNTER — Other Ambulatory Visit: Payer: Self-pay

## 2024-01-21 ENCOUNTER — Emergency Department (HOSPITAL_COMMUNITY)
Admission: EM | Admit: 2024-01-21 | Discharge: 2024-01-21 | Disposition: A | Discharge status: Home / Self Care | Source: Ambulatory Visit | Attending: Emergency Medicine | Admitting: Emergency Medicine

## 2024-01-21 DIAGNOSIS — E860 Dehydration: Secondary | ICD-10-CM | POA: Diagnosis not present

## 2024-01-21 DIAGNOSIS — J449 Chronic obstructive pulmonary disease, unspecified: Secondary | ICD-10-CM | POA: Insufficient documentation

## 2024-01-21 DIAGNOSIS — I952 Hypotension due to drugs: Secondary | ICD-10-CM | POA: Diagnosis not present

## 2024-01-21 DIAGNOSIS — I959 Hypotension, unspecified: Secondary | ICD-10-CM | POA: Diagnosis present

## 2024-01-21 LAB — CBC
HCT: 39.3 % (ref 36.0–46.0)
Hemoglobin: 11.9 g/dL — ABNORMAL LOW (ref 12.0–15.0)
MCH: 29 pg (ref 26.0–34.0)
MCHC: 30.3 g/dL (ref 30.0–36.0)
MCV: 95.6 fL (ref 80.0–100.0)
Platelets: 274 K/uL (ref 150–400)
RBC: 4.11 MIL/uL (ref 3.87–5.11)
RDW: 14.5 % (ref 11.5–15.5)
WBC: 6.2 K/uL (ref 4.0–10.5)
nRBC: 0 % (ref 0.0–0.2)

## 2024-01-21 LAB — COMPREHENSIVE METABOLIC PANEL WITH GFR
ALT: 11 U/L (ref 0–44)
AST: 18 U/L (ref 15–41)
Albumin: 3.9 g/dL (ref 3.5–5.0)
Alkaline Phosphatase: 64 U/L (ref 38–126)
Anion gap: 13 (ref 5–15)
BUN: 24 mg/dL — ABNORMAL HIGH (ref 8–23)
CO2: 28 mmol/L (ref 22–32)
Calcium: 9.2 mg/dL (ref 8.9–10.3)
Chloride: 98 mmol/L (ref 98–111)
Creatinine, Ser: 1.38 mg/dL — ABNORMAL HIGH (ref 0.44–1.00)
GFR, Estimated: 41 mL/min — ABNORMAL LOW (ref >60)
Glucose, Bld: 98 mg/dL (ref 70–99)
Potassium: 3.4 mmol/L — ABNORMAL LOW (ref 3.5–5.1)
Sodium: 139 mmol/L (ref 135–145)
Total Bilirubin: 0.5 mg/dL (ref 0.0–1.2)
Total Protein: 6.4 g/dL — ABNORMAL LOW (ref 6.5–8.1)

## 2024-01-21 LAB — CBG MONITORING, ED: Glucose-Capillary: 80 mg/dL (ref 70–99)

## 2024-01-21 LAB — URINALYSIS, ROUTINE W REFLEX MICROSCOPIC
Bilirubin Urine: NEGATIVE
Glucose, UA: NEGATIVE mg/dL
Hgb urine dipstick: NEGATIVE
Ketones, ur: NEGATIVE mg/dL
Leukocytes,Ua: NEGATIVE
Nitrite: NEGATIVE
Protein, ur: NEGATIVE mg/dL
Specific Gravity, Urine: 1.015 (ref 1.005–1.030)
pH: 5 (ref 5.0–8.0)

## 2024-01-21 MED ORDER — SODIUM CHLORIDE 0.9 % IV BOLUS
1000.0000 mL | Freq: Once | INTRAVENOUS | Status: AC
  Administered 2024-01-21: 1000 mL via INTRAVENOUS

## 2024-01-21 MED ORDER — ENALAPRIL MALEATE 10 MG PO TABS: 10.0000 mg | ORAL_TABLET | Freq: Every day | ORAL | 0 refills | Status: DC

## 2024-01-21 NOTE — ED Notes (Signed)
 Pt unable to provide urine at this time

## 2024-01-21 NOTE — Discharge Instructions (Signed)
 Today's evaluation has been generally reassuring.  There is some evidence that your low blood pressure is due to one of your medications, and dehydration.  Please stop taking the enalapril /hydrochlorothiazide .  Please obtain and begin taking enalapril .  Discuss this with your physician on follow-up within the next week.  Return here for concerning changes in your condition.

## 2024-01-21 NOTE — ED Triage Notes (Addendum)
 68 y/o female comes in by EMS from her PCP for hypotension. Per EMS, pts initial BP was 84/50. PT reports she was following up with her PCP after a inpatient hospital stay for a COPD exacerbation. PT reports she has been feeling dizzy, weak and general malaise since leaving the hospital. PT is awake, alert and answering questions appropriately

## 2024-01-21 NOTE — ED Provider Notes (Signed)
  EMERGENCY DEPARTMENT AT [facility] Provider Note   CSN: [id number] Arrival date & time: 01/21/24  1155     Patient presents with: Hypotension and Near Syncope   [name] is a 68 y.o. female.   HPI Patient presents with concern for hypotension. She was at her primary care office, for follow-up following prior COPD exacerbation when she was noted to be hypotensive.  Patient notes that she has been feeling slightly weaker and dizzier than usual, has had no focal pain, no syncope, no fall.  She has been taking her medication as directed.  Her antihypertensive regimen includes enalapril /hydrochlorothiazide . She notes that she has previously had lower extremity edema, though this is resolved and she currently has no edema, no dyspnea.     Prior to Admission medications   Medication Sig Start Date End Date Taking? Authorizing Provider  enalapril  (VASOTEC ) 10 MG tablet Take 1 tablet (10 mg total) by mouth daily. 01/21/24  Yes [name], MD  albuterol  (PROVENTIL ) (2.5 MG/3ML) 0.083% nebulizer solution Take 2.5 mg by nebulization every 6 (six) hours as needed for wheezing or shortness of breath.    [provider]  albuterol  (VENTOLIN  HFA) 108 (90 Base) MCG/ACT inhaler Inhale 2 puffs into the lungs every 4 (four) hours as needed for wheezing or shortness of breath. 07/24/23   Kara Dorn NOVAK, MD  atorvastatin  (LIPITOR) 10 MG tablet Take 10 mg by mouth at bedtime.    [provider]  benzonatate  (TESSALON ) 100 MG capsule Take 1 capsule (100 mg total) by mouth 3 (three) times daily as needed for cough. 01/02/24   Pokhrel, Vernal, MD  famotidine  (PEPCID ) 40 MG tablet Take 40 mg by mouth daily as needed for heartburn or indigestion.    [provider]  feeding supplement (ENSURE ENLIVE / ENSURE PLUS) LIQD Take 237 mLs by mouth 3 (three) times daily between meals. Patient not taking: Reported on 01/01/2024 09/06/23   Rizwan, Saima, MD   fluticasone -salmeterol (ADVAIR  DISKUS) 250-50 MCG/ACT AEPB Inhale 1 puff into the lungs in the morning and at bedtime. 01/02/24   Pokhrel, Laxman, MD  hydrOXYzine  (ATARAX ) 25 MG tablet Take 25 mg by mouth 3 (three) times daily as needed for anxiety. 09/25/23   [provider]  ipratropium-albuterol  (DUONEB) 0.5-2.5 (3) MG/3ML SOLN Take 3 mLs by nebulization every 6 (six) hours as needed. Patient taking differently: Take 3 mLs by nebulization 3 (three) times daily. 12/06/23   Akula, Vijaya, MD  mirtazapine  (REMERON ) 7.5 MG tablet Take 7.5 mg by mouth at bedtime. 09/26/23   [provider]  Multiple Vitamin (MULTIVITAMIN) capsule Take 1 capsule by mouth daily with breakfast.    [provider]  nicotine  (NICODERM CQ  - DOSED IN MG/24 HR) 7 mg/24hr patch Place 7 mg onto the skin See admin instructions. Apply 1 new patch to the skin once a day for smoking cessation while hospitalized    [provider]  oxyCODONE -acetaminophen  (PERCOCET) 10-325 MG tablet Take 1 tablet by mouth See admin instructions. Take 1 tablet by mouth every 4-5 hours 12/23/20   [provider]  OXYGEN Inhale 3 L/min into the lungs as needed (for shortness of breath).    [provider]  polyethylene glycol (MIRALAX  / GLYCOLAX ) 17 g packet Take 17 g by mouth daily as needed for mild constipation. 05/27/23   Tobie Yetta HERO, MD  predniSONE  (DELTASONE ) 20 MG tablet Take 2 tablets by mouth daily for 3 days followed by 1 and  1/2 tablets by mouth daily for 3 days followed by 1 tablet daily by mouth daily for 3 days followed by 1/2 tablet by mouth daily for 3 days. 01/02/24   [name], MD  PRESCRIPTION MEDICATION See admin instructions. CPAP- At bedtime    [provider]  zolpidem  (AMBIEN ) 5 MG tablet Take 5 mg by mouth at bedtime. 12/25/20   [provider]    Allergies: Patient has no known allergies.    Review of Systems  Updated Vital Signs BP 101/69 (BP  Location: Left Arm)   Pulse 96   Temp 98.5 F (36.9 C) (Oral)   Resp 18   Ht 1.549 m (5' 1)   Wt 46 kg   SpO2 100%   BMI 19.16 kg/m   Physical Exam Vitals and nursing note reviewed.  Constitutional:      General: She is not in acute distress.    Appearance: She is well-developed.  HENT:     Head: Normocephalic and atraumatic.  Eyes:     Conjunctiva/sclera: Conjunctivae normal.  Cardiovascular:     Rate and Rhythm: Normal rate and regular rhythm.  Pulmonary:     Effort: Pulmonary effort is normal. No respiratory distress.     Breath sounds: Normal breath sounds. No stridor.  Abdominal:     General: There is no distension.  Skin:    General: Skin is warm and dry.  Neurological:     Mental Status: She is alert and oriented to person, place, and time.     Cranial Nerves: No cranial nerve deficit.  Psychiatric:        Mood and Affect: Mood normal.     (all labs ordered are listed, but only abnormal results are displayed) Labs Reviewed  COMPREHENSIVE METABOLIC PANEL WITH GFR - Abnormal; Notable for the following components:      Result Value   Potassium 3.4 (*)    BUN 24 (*)    Creatinine, Ser 1.38 (*)    Total Protein 6.4 (*)    GFR, Estimated 41 (*)    All other components within normal limits  CBC - Abnormal; Notable for the following components:   Hemoglobin 11.9 (*)    All other components within normal limits  URINALYSIS, ROUTINE W REFLEX MICROSCOPIC  CBG MONITORING, ED    EKG: EKG Interpretation Date/Time:  Tuesday January 21 2024 12:19:02 EDT Ventricular Rate:  87 PR Interval:  158 QRS Duration:  104 QT Interval:  382 QTC Calculation: 436 R Axis:   88  Text Interpretation: Sinus rhythm Multiple premature complexes, vent & supraven Artifact Confirmed by [name] [phone]) on 01/21/2024 12:56:57 PM  Radiology: No results found.   Procedures   Medications Ordered in the ED  sodium chloride  0.9 % bolus 1,000 mL (1,000 mLs Intravenous New  Bag/Given 01/21/24 1454)                                    Medical Decision Making Adult female presents from primary care with concern for hypotension, lightheadedness.  Broad differential including dehydration, iatrogenic, arrhythmia, infection. Cardiac 75 sinus normal pulse ox 100% supplemental oxygen normal for her.  Amount and/or Complexity of Data Reviewed External Data Reviewed: notes.    Details: EMS/primary care note Labs: ordered. Decision-making details documented in ED Course. Radiology: ordered and independent interpretation performed. Decision-making details documented in ED Course. ECG/medicine tests: ordered and independent interpretation performed. Decision-making  details documented in ED Course.   4:09 PM Blood pressure is improved following fluid resuscitation. On prior repeat evaluations the patient was in no distress, with lengthy conversation about possibilities for her hypotension, and with fluid resuscitation here, suspicion for hypovolemia, particularly to the patient's renal function numbers. Patient remains in no distress, is awake, alert, with no new oxygen requirement, we discussed changes in her medication, follow-up with primary care, the patient will be discharged in stable condition.     Final diagnoses:  Dehydration  Hypotension due to drugs    ED Discharge Orders          Ordered    enalapril  (VASOTEC ) 10 MG tablet  Daily        01/21/24 1608               [name], MD 01/21/24 1609

## 2024-01-31 ENCOUNTER — Emergency Department (HOSPITAL_COMMUNITY): Admission: EM | Admit: 2024-01-31 | Discharge: 2024-01-31 | Discharge status: Against Medical Advice

## 2024-01-31 ENCOUNTER — Encounter (HOSPITAL_COMMUNITY): Payer: Self-pay

## 2024-01-31 ENCOUNTER — Emergency Department (HOSPITAL_COMMUNITY)

## 2024-01-31 ENCOUNTER — Other Ambulatory Visit: Payer: Self-pay

## 2024-01-31 DIAGNOSIS — R0602 Shortness of breath: Secondary | ICD-10-CM | POA: Insufficient documentation

## 2024-01-31 DIAGNOSIS — Z5329 Procedure and treatment not carried out because of patient's decision for other reasons: Secondary | ICD-10-CM | POA: Insufficient documentation

## 2024-01-31 DIAGNOSIS — J449 Chronic obstructive pulmonary disease, unspecified: Secondary | ICD-10-CM | POA: Diagnosis not present

## 2024-01-31 DIAGNOSIS — I10 Essential (primary) hypertension: Secondary | ICD-10-CM | POA: Diagnosis not present

## 2024-01-31 DIAGNOSIS — M7989 Other specified soft tissue disorders: Secondary | ICD-10-CM | POA: Diagnosis not present

## 2024-01-31 DIAGNOSIS — Z7951 Long term (current) use of inhaled steroids: Secondary | ICD-10-CM | POA: Insufficient documentation

## 2024-01-31 DIAGNOSIS — Z79899 Other long term (current) drug therapy: Secondary | ICD-10-CM | POA: Diagnosis not present

## 2024-01-31 MED ORDER — IPRATROPIUM-ALBUTEROL 0.5-2.5 (3) MG/3ML IN SOLN: 3.0000 mL | Freq: Once | RESPIRATORY_TRACT | Status: DC

## 2024-01-31 MED ORDER — PREDNISONE 20 MG PO TABS: 60.0000 mg | ORAL_TABLET | Freq: Once | ORAL | Status: DC

## 2024-01-31 NOTE — ED Notes (Signed)
 Entered pts room to obtain Resp panel swab and pt refused. She states she had it 4 weeks ago and does not need any of those things done. Pt states she wants food and drink and she wants her IV out where she can leave. Pt calling a cab.

## 2024-01-31 NOTE — ED Triage Notes (Signed)
 Pt bib EMS from home. SOB progressively worse over 2 days. Hx COPD. 3 L baseline O2. Tried using nebulizer at home. Douneb given this morning by EMS and pt refused to come to ED. Called out a 2nd time for EMS after 2 hrs. Wheezing present. Douneb given. 18 L FA 125 solumedrol. Denies chest pain. BP 118/76 HR 80 100% neb. Reports switching BP med and has developed swelling in both legs.

## 2024-01-31 NOTE — ED Notes (Signed)
 Pt had walked to restroom but it was occupied. Pt asked for bedpan and I gave her one. Asked pt if she was going to stay and let me do the test the EDP ordered and she said No.

## 2024-01-31 NOTE — ED Notes (Signed)
Pt refused to put gown on 

## 2024-01-31 NOTE — ED Notes (Signed)
 The patient refused any and all treatment and I was unable to change her mind. She advised she wanted her IV removed so she could leave. I explained to her that she would be leaving against medical advice and that her condition could worsen. She showed complete understanding of same and signed the AMA form. The provider was notified

## 2024-01-31 NOTE — ED Provider Notes (Signed)
 [name]   CSN: [id number] Arrival date & time: 01/31/24  1314     Patient presents with: Shortness of Breath   [name] is a 68 y.o. female.   68 year old female with past medical history of COPD presenting to the emergency department today with shortness of breath.  The patient states that she has been using her home nebulizers with minimal improvement.  She states that a few minutes after using a nebulizer treatment today she was still feeling short of breath so she called medics to come in for further evaluation.  The patient was given Solu-Medrol .  She denies any fevers.  Does report some lower extremity swelling that has been going on over the past few days.  She reports that she was on HCTZ until recently and this was discontinued.  She denies a history of DVT or pulmonary embolism, recent surgeries, recent travel.  Denies any hemoptysis.   Shortness of Breath      Prior to Admission medications   Medication Sig Start Date End Date Taking? Authorizing Provider  albuterol  (PROVENTIL ) (2.5 MG/3ML) 0.083% nebulizer solution Take 2.5 mg by nebulization every 6 (six) hours as needed for wheezing or shortness of breath.    [provider]  albuterol  (VENTOLIN  HFA) 108 (90 Base) MCG/ACT inhaler Inhale 2 puffs into the lungs every 4 (four) hours as needed for wheezing or shortness of breath. 07/24/23   Kara Dorn NOVAK, MD  atorvastatin  (LIPITOR) 10 MG tablet Take 10 mg by mouth at bedtime.    [provider]  benzonatate  (TESSALON ) 100 MG capsule Take 1 capsule (100 mg total) by mouth 3 (three) times daily as needed for cough. 01/02/24   Pokhrel, Vernal, MD  enalapril  (VASOTEC ) 10 MG tablet Take 1 tablet (10 mg total) by mouth daily. 01/21/24   [name], MD  famotidine  (PEPCID ) 40 MG tablet Take 40 mg by mouth daily as needed for heartburn or indigestion.    [provider]  feeding  supplement (ENSURE ENLIVE / ENSURE PLUS) LIQD Take 237 mLs by mouth 3 (three) times daily between meals. Patient not taking: Reported on 01/01/2024 09/06/23   Rizwan, Saima, MD  fluticasone -salmeterol (ADVAIR  DISKUS) 250-50 MCG/ACT AEPB Inhale 1 puff into the lungs in the morning and at bedtime. 01/02/24   Pokhrel, Vernal, MD  hydrOXYzine  (ATARAX ) 25 MG tablet Take 25 mg by mouth 3 (three) times daily as needed for anxiety. 09/25/23   [provider]  ipratropium-albuterol  (DUONEB) 0.5-2.5 (3) MG/3ML SOLN Take 3 mLs by nebulization every 6 (six) hours as needed. Patient taking differently: Take 3 mLs by nebulization 3 (three) times daily. 12/06/23   Akula, Vijaya, MD  mirtazapine  (REMERON ) 7.5 MG tablet Take 7.5 mg by mouth at bedtime. 09/26/23   [provider]  Multiple Vitamin (MULTIVITAMIN) capsule Take 1 capsule by mouth daily with breakfast.    [provider]  nicotine  (NICODERM CQ  - DOSED IN MG/24 HR) 7 mg/24hr patch Place 7 mg onto the skin See admin instructions. Apply 1 new patch to the skin once a day for smoking cessation while hospitalized    [provider]  oxyCODONE -acetaminophen  (PERCOCET) 10-325 MG tablet Take 1 tablet by mouth See admin instructions. Take 1 tablet by mouth every 4-5 hours 12/23/20   [provider]  OXYGEN Inhale 3 L/min into the lungs as needed (for shortness of breath).    [provider]  polyethylene glycol (MIRALAX  /  GLYCOLAX ) 17 g packet Take 17 g by mouth daily as needed for mild constipation. 05/27/23   Tobie Yetta HERO, MD  predniSONE  (DELTASONE ) 20 MG tablet Take 2 tablets by mouth daily for 3 days followed by 1 and 1/2 tablets by mouth daily for 3 days followed by 1 tablet daily by mouth daily for 3 days followed by 1/2 tablet by mouth daily for 3 days. 01/02/24   [name], MD  PRESCRIPTION MEDICATION See admin instructions. CPAP- At bedtime    [provider]  zolpidem  (AMBIEN ) 5 MG tablet Take 5  mg by mouth at bedtime. 12/25/20   [provider]    Allergies: Patient has no known allergies.    Review of Systems  Respiratory:  Positive for shortness of breath.   Cardiovascular:  Positive for leg swelling.  All other systems reviewed and are negative.   Updated Vital Signs BP 133/85 (BP Location: Right Arm)   Pulse 74   Temp 98.5 F (36.9 C) (Oral)   Resp 20   Ht 5' 1 (1.549 m)   Wt 46 kg   SpO2 99%   BMI 19.16 kg/m   Physical Exam Vitals and nursing Arnold reviewed.   Gen: NAD Eyes: PERRL, EOMI HEENT: no oropharyngeal swelling Neck: trachea midline Resp: Diminished with diffuse wheezes throughout all lung fields Card: RRR, no murmurs, rubs, or gallops Abd: nontender, nondistended Extremities: no calf tenderness, no edema Vascular: 2+ radial pulses bilaterally, 2+ DP pulses bilaterally Skin: no rashes Psyc: acting appropriately   (all labs ordered are listed, but only abnormal results are displayed) Labs Reviewed  RESP PANEL BY RT-PCR (RSV, FLU A&B, COVID)  [id number]  BASIC METABOLIC PANEL WITH GFR  PRO BRAIN NATRIURETIC PEPTIDE    EKG: None  Radiology: No results found.   Procedures   Medications Ordered in the ED  ipratropium-albuterol  (DUONEB) 0.5-2.5 (3) MG/3ML nebulizer solution 3 mL (has no administration in time range)  ipratropium-albuterol  (DUONEB) 0.5-2.5 (3) MG/3ML nebulizer solution 3 mL (has no administration in time range)                                    Medical Decision Making 68 year old female with past medical history of COPD, asthma, and hypertension presenting to the emergency department today with lower extremity swelling and shortness of breath.  I will further evaluate the patient here with basic labs Wels and EKG, chest x-ray, BNP further evaluation for ACS, pulmonary edema, pulmonary infiltrates, pneumothorax.  I will give the patient 2 additional DuoNebs here she does have wheezing still here on exam.  She has  already received Solu-Medrol .  Suspect this is likely due to COPD exacerbation.  Will also obtain RSV/COVID/flu swab on the patient.  I will reevaluate for ultimate disposition.  After I evaluated the patient initially before she received any of her treatments she decided that she was feeling better and did not want any further workup.  I did explain the patient that with leg swelling that additional workup would be important to evaluate for CHF, pneumonia, or COPD.  She signed out AGAINST MEDICAL ADVICE before she could be officially discharged with medications.  Amount and/or Complexity of Data Reviewed Labs: ordered. Radiology: ordered.  Risk Prescription drug management.        Final diagnoses:  Shortness of breath    ED Discharge Orders     None  Ula Prentice SAUNDERS, MD 01/31/24 [phone]

## 2024-02-18 ENCOUNTER — Other Ambulatory Visit: Payer: Self-pay

## 2024-02-18 ENCOUNTER — Encounter (HOSPITAL_COMMUNITY): Payer: Self-pay

## 2024-02-18 ENCOUNTER — Emergency Department (HOSPITAL_COMMUNITY)
Admission: EM | Admit: 2024-02-18 | Discharge: 2024-02-18 | Disposition: A | Discharge status: Home / Self Care | Attending: Emergency Medicine | Admitting: Emergency Medicine

## 2024-02-18 ENCOUNTER — Emergency Department (HOSPITAL_COMMUNITY)

## 2024-02-18 DIAGNOSIS — J441 Chronic obstructive pulmonary disease with (acute) exacerbation: Secondary | ICD-10-CM | POA: Diagnosis not present

## 2024-02-18 DIAGNOSIS — I10 Essential (primary) hypertension: Secondary | ICD-10-CM | POA: Insufficient documentation

## 2024-02-18 DIAGNOSIS — J45909 Unspecified asthma, uncomplicated: Secondary | ICD-10-CM | POA: Diagnosis not present

## 2024-02-18 DIAGNOSIS — R0602 Shortness of breath: Secondary | ICD-10-CM | POA: Diagnosis present

## 2024-02-18 LAB — CBC
HCT: 42.9 % (ref 36.0–46.0)
Hemoglobin: 12.8 g/dL (ref 12.0–15.0)
MCH: 28.8 pg (ref 26.0–34.0)
MCHC: 29.8 g/dL — ABNORMAL LOW (ref 30.0–36.0)
MCV: 96.6 fL (ref 80.0–100.0)
Platelets: 284 K/uL (ref 150–400)
RBC: 4.44 MIL/uL (ref 3.87–5.11)
RDW: 14.9 % (ref 11.5–15.5)
WBC: 6.3 K/uL (ref 4.0–10.5)
nRBC: 0 % (ref 0.0–0.2)

## 2024-02-18 LAB — TROPONIN T, HIGH SENSITIVITY
Troponin T High Sensitivity: <15 ng/L (ref 0–19)
Troponin T High Sensitivity: <15 ng/L (ref 0–19)

## 2024-02-18 LAB — BASIC METABOLIC PANEL WITH GFR
Anion gap: 11 (ref 5–15)
BUN: 13 mg/dL (ref 8–23)
CO2: 30 mmol/L (ref 22–32)
Calcium: 9.5 mg/dL (ref 8.9–10.3)
Chloride: 100 mmol/L (ref 98–111)
Creatinine, Ser: 0.73 mg/dL (ref 0.44–1.00)
GFR, Estimated: >60 mL/min (ref >60)
Glucose, Bld: 143 mg/dL — ABNORMAL HIGH (ref 70–99)
Potassium: 3.4 mmol/L — ABNORMAL LOW (ref 3.5–5.1)
Sodium: 141 mmol/L (ref 135–145)

## 2024-02-18 MED ORDER — PREDNISONE 20 MG PO TABS: 40.0000 mg | ORAL_TABLET | Freq: Every day | ORAL | 0 refills | Status: AC

## 2024-02-18 MED ORDER — OXYCODONE-ACETAMINOPHEN 5-325 MG PO TABS
2.0000 | ORAL_TABLET | Freq: Once | ORAL | Status: AC
  Administered 2024-02-18: 2 via ORAL
  Filled 2024-02-18: qty 2

## 2024-02-18 MED ORDER — MORPHINE SULFATE (PF) 4 MG/ML IV SOLN
4.0000 mg | Freq: Once | INTRAVENOUS | Status: AC
  Administered 2024-02-18: 4 mg via INTRAVENOUS
  Filled 2024-02-18: qty 1

## 2024-02-18 NOTE — Discharge Instructions (Addendum)
 While you were in the emergency room, you had blood work done that was normal.  Your chest x-ray was normal.  You were experiencing a COPD exacerbation that got better with treatment.  I have sent a prescription for prednisone  for you to take for the next 5 days.  Beginning tomorrow, you can take 40 mg of prednisone  daily.  Return to the emergency room for worsening shortness of breath.  Follow-up with your primary care doctor within 1 week.  Continue to use all medications as prescribed.  I have also included the telephone number for the general surgery office.  You may call them to discuss the cyst on your neck.

## 2024-02-18 NOTE — ED Provider Notes (Signed)
 [place] EMERG[facility] DEPARTMENT AT [facility] Provider Note  CSN: [id number] Arrival date & time: 02/18/24 1835  Chief Complaint(s) Shortness of Breath  HPI [name] is a 68 y.o. female who is here today for shortness of breath.  Patient has a history of COPD, wears 4 L at baseline.  When EMS arrived, patient was 82% on 2 L.  She denies any fever, chills.  She has no prior history of pulmonary embolism or DVT.   Past Medical History Past Medical History:  Diagnosis Date   Anxiety    Arthritis    Asthma    Chest pain 2006   Chest pain 10/2016   Chronic back pain    COPD (chronic obstructive pulmonary disease) (HCC)    Hypertension    Sciatica    Shortness of breath    due to  smoking    Patient Active Problem List   Diagnosis Date Noted   Chronic hypoxic respiratory failure (HCC) 12/02/2023   Continuous dependence on cigarette smoking 12/02/2023   Generalized anxiety disorder 12/02/2023   Acute exacerbation of chronic obstructive pulmonary disease (COPD) (HCC) 12/02/2023   Acute respiratory failure with hypoxia (HCC) 10/11/2023   Rectal bleeding 10/11/2023   Acute on chronic hypoxic respiratory failure (HCC) 09/06/2023   Wheezing 05/24/2023   COPD exacerbation (HCC) 05/22/2023   Precordial chest pain 03/31/2023   Centrilobular emphysema (HCC) 12/13/2022   Palliative care by specialist 10/10/2022   Goals of care, counseling/discussion 10/10/2022   Vocal cord dysfunction 10/08/2022   Anxiety 10/08/2022   Protein-calorie malnutrition, severe 10/04/2022   COPD with acute exacerbation (HCC) 10/02/2022   Grief 06/03/2022   Pain of left upper extremity 12/28/2016   Elevated TSH 11/08/2016   Gastroesophageal reflux disease 11/08/2016   Constipation 11/08/2016   COPD (chronic obstructive pulmonary disease) (HCC) 11/08/2016   Tobacco use 11/08/2016   Non-cardiac chest pain 11/07/2016   Unstable angina (HCC) 01/07/2016   Essential hypertension 01/07/2016    Hypokalemia 11/06/2013   Acute blood loss anemia 11/06/2013   Osteoarthritis of left hip 11/05/2013   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 Authorizing Provider  albuterol  (PROVENTIL ) (2.5 MG/3ML) 0.083% nebulizer solution Take 2.5 mg by nebulization every 6 (six) hours as needed for wheezing or shortness of breath.    [provider]  albuterol  (VENTOLIN  HFA) 108 (90 Base) MCG/ACT inhaler Inhale 2 puffs into the lungs every 4 (four) hours as needed for wheezing or shortness of breath. 07/24/23   Kara Dorn NOVAK, MD  atorvastatin  (LIPITOR) 10 MG tablet Take 10 mg by mouth at bedtime.    [provider]  benzonatate  (TESSALON ) 100 MG capsule Take 1 capsule (100 mg total) by mouth 3 (three) times daily as needed for cough. 01/02/24   Pokhrel, Laxman, MD  enalapril  (VASOTEC ) 10 MG tablet Take 1 tablet (10 mg total) by mouth daily. 01/21/24   [name], MD  famotidine  (PEPCID ) 40 MG tablet Take 40 mg by mouth daily as needed for heartburn or indigestion.    [provider]  feeding supplement (ENSURE ENLIVE / ENSURE PLUS) LIQD Take 237 mLs by mouth 3 (three) times daily between meals. Patient not taking: Reported on 01/01/2024 09/06/23   Rizwan, Saima, MD  fluticasone -salmeterol (ADVAIR  DISKUS) 250-50 MCG/ACT AEPB Inhale 1 puff into the lungs in the  morning and at bedtime. 01/02/24   Pokhrel, Laxman, MD  hydrOXYzine  (ATARAX ) 25 MG tablet Take 25 mg by mouth 3 (three) times daily as needed for anxiety. 09/25/23   [provider]  ipratropium-albuterol  (DUONEB) 0.5-2.5 (3) MG/3ML SOLN Take 3 mLs by nebulization every 6 (six) hours as needed. Patient taking differently: Take 3 mLs by nebulization 3 (three) times daily. 12/06/23   Akula, Vijaya, MD  mirtazapine  (REMERON ) 7.5 MG  tablet Take 7.5 mg by mouth at bedtime. 09/26/23   [provider]  Multiple Vitamin (MULTIVITAMIN) capsule Take 1 capsule by mouth daily with breakfast.    [provider]  nicotine  (NICODERM CQ  - DOSED IN MG/24 HR) 7 mg/24hr patch Place 7 mg onto the skin See admin instructions. Apply 1 new patch to the skin once a day for smoking cessation while hospitalized    [provider]  oxyCODONE -acetaminophen  (PERCOCET) 10-325 MG tablet Take 1 tablet by mouth See admin instructions. Take 1 tablet by mouth every 4-5 hours 12/23/20   [provider]  OXYGEN Inhale 3 L/min into the lungs as needed (for shortness of breath).    [provider]  polyethylene glycol (MIRALAX  / GLYCOLAX ) 17 g packet Take 17 g by mouth daily as needed for mild constipation. 05/27/23   Tobie Yetta HERO, MD  predniSONE  (DELTASONE ) 20 MG tablet Take 2 tablets by mouth daily for 3 days followed by 1 and 1/2 tablets by mouth daily for 3 days followed by 1 tablet daily by mouth daily for 3 days followed by 1/2 tablet by mouth daily for 3 days. 01/02/24   [name], MD  PRESCRIPTION MEDICATION See admin instructions. CPAP- At bedtime    [provider]  zolpidem  (AMBIEN ) 5 MG tablet Take 5 mg by mouth at bedtime. 12/25/20   [provider]                                                                                                                                    Past Surgical History Past Surgical History:  Procedure Laterality Date   ABDOMINAL HYSTERECTOMY  1997   CARDIAC CATHETERIZATION  2006   HEMI-MICRODISCECTOMY LUMBAR LAMINECTOMY LEVEL 1  05/14/2012   Procedure: HEMI-MICRODISCECTOMY LUMBAR LAMINECTOMY LEVEL 1;  Surgeon: Tanda DELENA Heading, MD;  Location: [facility];  Service: Orthopedics;  Laterality: N/A;  Hemi Laminectomy Microdiscectomy L4 - L5 Central (X-Ray)   ingrown toenail removal     TONSILLECTOMY     TOTAL HIP ARTHROPLASTY Left 11/05/2013   Procedure: LEFT  TOTAL HIP ARTHROPLASTY;  Surgeon: Tanda DELENA Heading, MD;  Location: [facility];  Service: Orthopedics;  Laterality: Left;   Family History Family History  Problem Relation Age of Onset   Heart attack Mother 29   Hypertension Sister    Diabetes type II Sister    Tongue cancer Brother    Heart attack Sister 59   Lung cancer Sister  Hypertension Sister     Social History Social History   Tobacco Use   Smoking status: Some Days    Current packs/day: 0.10    Average packs/day: 0.1 packs/day for 30.0 years (3.0 ttl pk-yrs)    Types: Cigarettes   Smokeless tobacco: Never   Tobacco comments:    Smoking since teenage years  Vaping Use   Vaping status: Never Used  Substance Use Topics   Alcohol use: No   Drug use: No   Allergies Patient has no known allergies.  Review of Systems Review of Systems  Physical Exam Vital Signs  I have reviewed the triage vital signs BP 98/62   Pulse 75   Temp 98.6 F (37 C) (Oral)   Resp 17   Ht 5' 1 (1.549 m)   Wt 42.6 kg   SpO2 99%   BMI 17.76 kg/m   Physical Exam Vitals and nursing note reviewed.  Cardiovascular:     Rate and Rhythm: Normal rate.  Pulmonary:     Breath sounds: Wheezing present. No decreased breath sounds.  Chest:     Chest wall: No mass or tenderness.  Musculoskeletal:        General: Normal range of motion.     Right lower leg: No edema.     Left lower leg: No edema.  Skin:    General: Skin is warm.  Neurological:     General: No focal deficit present.     Mental Status: She is alert.     ED Results and Treatments Labs (all labs ordered are listed, but only abnormal results are displayed) Labs Reviewed  BASIC METABOLIC PANEL WITH GFR - Abnormal; Notable for the following components:      Result Value   Potassium 3.4 (*)    Glucose, Bld 143 (*)    All other components within normal limits  CBC - Abnormal; Notable for the following components:   MCHC 29.8 (*)    All other components within normal  limits  TROPONIN T, HIGH SENSITIVITY  TROPONIN T, HIGH SENSITIVITY                                                                                                                          Radiology DG Chest 2 View Result Date: 02/18/2024 EXAM: 2 VIEW(S) XRAY OF THE CHEST 02/18/2024 07:18:00 PM COMPARISON: None available. CLINICAL HISTORY: short of breath. Per chart: ; Patient BIB [facility] from home. Has been short of breath for a week but this morning at 3am it worsened. Has COPD and asthma. Wheezing on arrival. 82% room air. Has left sided chest pain no radiation. FINDINGS: LUNGS AND PLEURA: Emphysema. Nodular densities at the lung bases bilaterally are in keeping with nipple shadows. No confluent pulmonary infiltrate. HEART AND MEDIASTINUM: No acute abnormality of the cardiac and mediastinal silhouettes. BONES AND SOFT TISSUES: No acute osseous abnormality. IMPRESSION: 1. No acute process. 2. Emphysema. Electronically signed by: Dorethia Molt MD 02/18/2024 07:38 PM EDT  RP Workstation: [id number]    Pertinent labs & imaging results that were available during my care of the patient were reviewed by me and considered in my medical decision making (see MDM for details).  Medications Ordered in ED Medications  morphine  (PF) 4 MG/ML injection 4 mg (4 mg Intravenous Given 02/18/24 2004)                                                                                                                                     Procedures Procedures  (including critical care time)  Medical Decision Making / ED Course   This patient presents to the ED for concern of shortness of breath, this involves an extensive number of treatment options, and is a complaint that carries with it a high risk of complications and morbidity.  The differential diagnosis includes COPD exacerbation, consider pulmonary edema, less likely PE.  MDM: Arrival to the ED, patient was satting 100% on her usual oxygen.  She reports that  she been having increasing dyspnea on exertion over the last 1 week.  She has been using her rescue inhaler more frequently.  Patient's work of breathing had improved prior to ED arrival secondary to EMS treatments.  Monitor here in the emergency room, patient was able to maintain sats of 98% and greater while on her usual home O2.  Ambulated the patient around the ED at her home O2 with O2 sats at 98% or greater.  Her chest x-ray, per my independent review shows no pneumonia.  Blood work shows no anemia, renal function at baseline.  Patient also mentioned a small area on the right side of her neck.  Peers to be a simple cyst.  Will provide her general surgery referral.  Will discharge patient with prescription for prednisone .   Additional history obtained: -Additional history obtained from EMS -External records from outside source obtained and reviewed including: Chart review including previous notes, labs, imaging, consultation notes   Lab Tests: -I ordered, reviewed, and interpreted labs.   The pertinent results include:   Labs Reviewed  BASIC METABOLIC PANEL WITH GFR - Abnormal; Notable for the following components:      Result Value   Potassium 3.4 (*)    Glucose, Bld 143 (*)    All other components within normal limits  CBC - Abnormal; Notable for the following components:   MCHC 29.8 (*)    All other components within normal limits  TROPONIN T, HIGH SENSITIVITY  TROPONIN T, HIGH SENSITIVITY      EKG my independent review of the patient's EKG shows no ST segment depressions or elevations, no T wave inversions, no evidence of acute ischemia.  EKG Interpretation Date/Time:  Tuesday February 18 2024 18:58:28 EDT Ventricular Rate:  100 PR Interval:  152 QRS Duration:  76 QT Interval:  405 QTC Calculation: 496 R Axis:   86  Text Interpretation: Sinus tachycardia Ventricular premature complex Borderline  right axis deviation Anteroseptal infarct, old Minimal ST elevation,  inferior leads Confirmed by Mannie Pac [phone]) on 02/18/2024 7:01:31 PM         Imaging Studies ordered: I ordered imaging studies including chest x-ray I independently visualized and interpreted imaging. I agree with the radiologist interpretation   Medicines ordered and prescription drug management: Meds ordered this encounter  Medications   morphine  (PF) 4 MG/ML injection 4 mg    -I have reviewed the patients home medicines and have made adjustments as needed    Cardiac Monitoring: The patient was maintained on a cardiac monitor.  I personally viewed and interpreted the cardiac monitored which showed an underlying rhythm of: Normal sinus rhythm  Social Determinants of Health:  Factors impacting patients care include: Lack of access to primary care   Reevaluation: After the interventions noted above, I reevaluated the patient and found that they have :improved  Co morbidities that complicate the patient evaluation  Past Medical History:  Diagnosis Date   Anxiety    Arthritis    Asthma    Chest pain 2006   Chest pain 10/2016   Chronic back pain    COPD (chronic obstructive pulmonary disease) (HCC)    Hypertension    Sciatica    Shortness of breath    due to  smoking       Dispostion: I considered admission for this patient, however with her improvement, her ability to ambulate with her baseline oxygen without dyspnea, she is appropriate for discharge.     Final Clinical Impression(s) / ED Diagnoses Final diagnoses:  None     @PCDICTATION @    Mannie Pac T, DO 02/18/24 2251

## 2024-02-18 NOTE — ED Triage Notes (Addendum)
 Patient BIB [facility] from home. Has been short of breath for a week but this morning at 3am it worsened. Has COPD and asthma. Wheezing on arrival. 82% room air. Has left sided chest pain no radiation.   EMS 20G left hand 125mg  solumedrol 2 duonebs

## 2024-02-25 ENCOUNTER — Emergency Department (HOSPITAL_COMMUNITY)
Admission: EM | Admit: 2024-02-25 | Discharge: 2024-02-26 | Disposition: A | Discharge status: Home / Self Care | Source: Ambulatory Visit | Attending: Emergency Medicine | Admitting: Emergency Medicine

## 2024-02-25 ENCOUNTER — Emergency Department (HOSPITAL_COMMUNITY)

## 2024-02-25 DIAGNOSIS — Z7951 Long term (current) use of inhaled steroids: Secondary | ICD-10-CM | POA: Insufficient documentation

## 2024-02-25 DIAGNOSIS — J441 Chronic obstructive pulmonary disease with (acute) exacerbation: Secondary | ICD-10-CM | POA: Insufficient documentation

## 2024-02-25 DIAGNOSIS — E876 Hypokalemia: Secondary | ICD-10-CM | POA: Diagnosis not present

## 2024-02-25 DIAGNOSIS — R0602 Shortness of breath: Secondary | ICD-10-CM | POA: Diagnosis present

## 2024-02-25 DIAGNOSIS — R600 Localized edema: Secondary | ICD-10-CM | POA: Diagnosis not present

## 2024-02-25 LAB — CBC
HCT: 42.8 % (ref 36.0–46.0)
Hemoglobin: 13.1 g/dL (ref 12.0–15.0)
MCH: 29.4 pg (ref 26.0–34.0)
MCHC: 30.6 g/dL (ref 30.0–36.0)
MCV: 96 fL (ref 80.0–100.0)
Platelets: 200 K/uL (ref 150–400)
RBC: 4.46 MIL/uL (ref 3.87–5.11)
RDW: 14.6 % (ref 11.5–15.5)
WBC: 5.8 K/uL (ref 4.0–10.5)
nRBC: 0 % (ref 0.0–0.2)

## 2024-02-25 LAB — BASIC METABOLIC PANEL WITH GFR
Anion gap: 13 (ref 5–15)
BUN: 23 mg/dL (ref 8–23)
CO2: 32 mmol/L (ref 22–32)
Calcium: 9.4 mg/dL (ref 8.9–10.3)
Chloride: 96 mmol/L — ABNORMAL LOW (ref 98–111)
Creatinine, Ser: 0.99 mg/dL (ref 0.44–1.00)
GFR, Estimated: >60 mL/min (ref >60)
Glucose, Bld: 92 mg/dL (ref 70–99)
Potassium: 2.8 mmol/L — ABNORMAL LOW (ref 3.5–5.1)
Sodium: 141 mmol/L (ref 135–145)

## 2024-02-25 MED ORDER — POTASSIUM CHLORIDE CRYS ER 20 MEQ PO TBCR
40.0000 meq | EXTENDED_RELEASE_TABLET | Freq: Once | ORAL | Status: AC
  Administered 2024-02-26: 40 meq via ORAL
  Filled 2024-02-25: qty 2

## 2024-02-25 MED ORDER — OXYCODONE-ACETAMINOPHEN 5-325 MG PO TABS
2.0000 | ORAL_TABLET | Freq: Once | ORAL | Status: AC
  Administered 2024-02-26: 2 via ORAL
  Filled 2024-02-25: qty 2

## 2024-02-25 MED ORDER — IPRATROPIUM-ALBUTEROL 0.5-2.5 (3) MG/3ML IN SOLN
3.0000 mL | Freq: Once | RESPIRATORY_TRACT | Status: AC
Start: 2024-02-25 — End: 2024-02-26
  Administered 2024-02-26: 3 mL via RESPIRATORY_TRACT
  Filled 2024-02-25: qty 3

## 2024-02-25 NOTE — ED Provider Notes (Signed)
 [place] [facility]ENC[facility] DEPARTMENT AT [facility] Provider Note   CSN: [id number] Arrival date & time: 02/25/24  1616     Patient presents with: Abnormal Lab and Shortness of Breath   [name] is a 68 y.o. female.  {Add pertinent medical, surgical, social history, OB history to YEP:67052} The history is provided by the patient.  Abnormal Lab Shortness of Breath [name] is a 68 y.o. female who presents to the Emergency Department complaining of *** Went to doctor yesterday and said had blood drawn.  Was told her co2 and potassium were critical at 38.3 and 3.3  Has HA for months. Between sat and Sunday almost fell backwards. Because Has dry mouth Had chest pain on Saturday and sweating.  No fever Mild cough, productive of clear sputum. Chronic sob, worsening.  Has swelling in legs for the last two weeks.   3LNC  Prior to Admission medications   Medication Sig Start Date End Date Taking? Authorizing Provider  albuterol  (PROVENTIL ) (2.5 MG/3ML) 0.083% nebulizer solution Take 2.5 mg by nebulization every 6 (six) hours as needed for wheezing or shortness of breath.    [provider]  albuterol  (VENTOLIN  HFA) 108 (90 Base) MCG/ACT inhaler Inhale 2 puffs into the lungs every 4 (four) hours as needed for wheezing or shortness of breath. 07/24/23   Kara Dorn NOVAK, MD  atorvastatin  (LIPITOR) 10 MG tablet Take 10 mg by mouth at bedtime.    [provider]  benzonatate  (TESSALON ) 100 MG capsule Take 1 capsule (100 mg total) by mouth 3 (three) times daily as needed for cough. 01/02/24   Pokhrel, Laxman, MD  enalapril  (VASOTEC ) 10 MG tablet Take 1 tablet (10 mg total) by mouth daily. 01/21/24   [name], MD  famotidine  (PEPCID ) 40 MG tablet Take 40 mg by mouth daily as needed for heartburn or indigestion.    [provider]  feeding supplement (ENSURE ENLIVE / ENSURE PLUS) LIQD Take 237 mLs by mouth 3 (three) times daily between  meals. Patient not taking: Reported on 01/01/2024 09/06/23   Rizwan, Saima, MD  fluticasone -salmeterol (ADVAIR  DISKUS) 250-50 MCG/ACT AEPB Inhale 1 puff into the lungs in the morning and at bedtime. 01/02/24   Pokhrel, Vernal, MD  hydrOXYzine  (ATARAX ) 25 MG tablet Take 25 mg by mouth 3 (three) times daily as needed for anxiety. 09/25/23   [provider]  ipratropium-albuterol  (DUONEB) 0.5-2.5 (3) MG/3ML SOLN Take 3 mLs by nebulization every 6 (six) hours as needed. Patient taking differently: Take 3 mLs by nebulization 3 (three) times daily. 12/06/23   Akula, Vijaya, MD  mirtazapine  (REMERON ) 7.5 MG tablet Take 7.5 mg by mouth at bedtime. 09/26/23   [provider]  Multiple Vitamin (MULTIVITAMIN) capsule Take 1 capsule by mouth daily with breakfast.    [provider]  nicotine  (NICODERM CQ  - DOSED IN MG/24 HR) 7 mg/24hr patch Place 7 mg onto the skin See admin instructions. Apply 1 new patch to the skin once a day for smoking cessation while hospitalized    [provider]  oxyCODONE -acetaminophen  (PERCOCET) 10-325 MG tablet Take 1 tablet by mouth See admin instructions. Take 1 tablet by mouth every 4-5 hours 12/23/20   [provider]  OXYGEN Inhale 3 L/min into the lungs as needed (for shortness of breath).    [provider]  polyethylene glycol (MIRALAX  / GLYCOLAX ) 17 g packet Take 17 g by mouth daily as needed for mild constipation. 05/27/23   Patel, Pranav  M, MD  PRESCRIPTION MEDICATION See admin instructions. CPAP- At bedtime    [provider]  zolpidem  (AMBIEN ) 5 MG tablet Take 5 mg by mouth at bedtime. 12/25/20   [provider]    Allergies: Patient has no known allergies.    Review of Systems  Respiratory:  Positive for shortness of breath.   All other systems reviewed and are negative.   Updated Vital Signs BP 113/69 (BP Location: Left Arm)   Pulse 85   Temp 98.6 F (37 C) (Oral)   Resp 16   SpO2 100%    Physical Exam Vitals and nursing note reviewed.  Constitutional:      Appearance: She is well-developed.  HENT:     Head: Normocephalic and atraumatic.  Cardiovascular:     Rate and Rhythm: Normal rate and regular rhythm.     Heart sounds: No murmur heard. Pulmonary:     Effort: Pulmonary effort is normal. No respiratory distress.     Comments: Expiratory wheezes bilaterally Abdominal:     Palpations: Abdomen is soft.     Tenderness: There is no abdominal tenderness. There is no guarding or rebound.  Musculoskeletal:        General: No tenderness.     Comments: Trace nonpitting edema to BLE.   Skin:    General: Skin is warm and dry.  Neurological:     Mental Status: She is alert and oriented to person, place, and time.  Psychiatric:        Behavior: Behavior normal.     (all labs ordered are listed, but only abnormal results are displayed) Labs Reviewed  BASIC METABOLIC PANEL WITH GFR - Abnormal; Notable for the following components:      Result Value   Potassium 2.8 (*)    Chloride 96 (*)    All other components within normal limits  CBC    EKG: EKG Interpretation Date/Time:  Tuesday February 25 2024 16:30:54 EDT Ventricular Rate:  81 PR Interval:  149 QRS Duration:  88 QT Interval:  352 QTC Calculation: 409 R Axis:   87  Text Interpretation: Sinus rhythm Atrial premature complexes Consider right atrial enlargement Anteroseptal infarct, old Baseline wander in lead(s) III Confirmed by Griselda Norris [phone]) on 02/25/2024 11:07:11 PM  Radiology: No results found.  {Document cardiac monitor, telemetry assessment procedure when appropriate:32947} Procedures   Medications Ordered in the ED - No data to display    {Click here for ABCD2, HEART and other calculators REFRESH Note before signing:1}                              Medical Decision Making Amount and/or Complexity of Data Reviewed Labs: ordered. Radiology: ordered.  Risk Prescription drug  management.   ***  {Document critical care time when appropriate  Document review of labs and clinical decision tools ie CHADS2VASC2, etc  Document your independent review of radiology images and any outside records  Document your discussion with family members, caretakers and with consultants  Document social determinants of health affecting pt's care  Document your decision making why or why not admission, treatments were needed:32947:::1}   Final diagnoses:  None    ED Discharge Orders     None

## 2024-02-25 NOTE — ED Triage Notes (Signed)
 Pt reports that yesterday she was at pain management doctor and was called today to f/u with lab results and told she was retaining CO2 with level at 38 and had her potassium at 3.3 and advised to go to ED. Pt does state that she has been having to increase oxygen use recently with feeling SOB and having exertional dyspnea. Last night had brief episode of CP that quickly resolved. NAD Noted now. Wearing oxygen concentrator at 3 lpm

## 2024-02-26 LAB — TROPONIN T, HIGH SENSITIVITY
Troponin T High Sensitivity: <15 ng/L (ref 0–19)
Troponin T High Sensitivity: <15 ng/L (ref 0–19)

## 2024-02-26 LAB — PRO BRAIN NATRIURETIC PEPTIDE: Pro Brain Natriuretic Peptide: 105 pg/mL (ref <300.0)

## 2024-02-26 LAB — D-DIMER, QUANTITATIVE: D-Dimer, Quant: <0.27 ug{FEU}/mL (ref 0.00–0.50)

## 2024-02-26 LAB — MAGNESIUM: Magnesium: 2 mg/dL (ref 1.7–2.4)

## 2024-02-26 MED ORDER — PREDNISONE 20 MG PO TABS
40.0000 mg | ORAL_TABLET | Freq: Once | ORAL | Status: AC
  Administered 2024-02-26: 40 mg via ORAL
  Filled 2024-02-26: qty 2

## 2024-02-26 MED ORDER — PREDNISONE 10 MG PO TABS: 40.0000 mg | ORAL_TABLET | Freq: Every day | ORAL | 0 refills | Status: DC

## 2024-02-26 MED ORDER — POTASSIUM CHLORIDE CRYS ER 20 MEQ PO TBCR: 20.0000 meq | EXTENDED_RELEASE_TABLET | Freq: Every day | ORAL | 0 refills | Status: DC

## 2024-02-26 MED ORDER — POTASSIUM CHLORIDE CRYS ER 20 MEQ PO TBCR
20.0000 meq | EXTENDED_RELEASE_TABLET | Freq: Once | ORAL | Status: AC
  Administered 2024-02-26: 20 meq via ORAL
  Filled 2024-02-26: qty 1

## 2024-03-06 ENCOUNTER — Other Ambulatory Visit: Payer: Self-pay | Admitting: Family Medicine

## 2024-03-06 DIAGNOSIS — Z1231 Encounter for screening mammogram for malignant neoplasm of breast: Secondary | ICD-10-CM

## 2024-03-13 ENCOUNTER — Other Ambulatory Visit: Payer: Self-pay | Admitting: Family Medicine

## 2024-03-13 DIAGNOSIS — R221 Localized swelling, mass and lump, neck: Secondary | ICD-10-CM

## 2024-03-17 ENCOUNTER — Ambulatory Visit

## 2024-03-23 ENCOUNTER — Other Ambulatory Visit: Payer: Self-pay | Admitting: Family Medicine

## 2024-03-23 ENCOUNTER — Ambulatory Visit
Admission: RE | Admit: 2024-03-23 | Discharge: 2024-03-23 | Disposition: A | Discharge status: Home / Self Care | Source: Ambulatory Visit | Attending: Family Medicine | Admitting: Family Medicine

## 2024-03-23 DIAGNOSIS — F17210 Nicotine dependence, cigarettes, uncomplicated: Secondary | ICD-10-CM

## 2024-03-23 DIAGNOSIS — R221 Localized swelling, mass and lump, neck: Secondary | ICD-10-CM

## 2024-03-27 ENCOUNTER — Other Ambulatory Visit: Payer: Self-pay

## 2024-03-27 ENCOUNTER — Encounter (HOSPITAL_COMMUNITY): Payer: Self-pay | Admitting: *Deleted

## 2024-03-27 ENCOUNTER — Ambulatory Visit
Admission: RE | Admit: 2024-03-27 | Discharge: 2024-03-27 | Disposition: A | Discharge status: Home / Self Care | Source: Ambulatory Visit | Attending: Family Medicine | Admitting: Family Medicine

## 2024-03-27 ENCOUNTER — Emergency Department (HOSPITAL_COMMUNITY)

## 2024-03-27 ENCOUNTER — Observation Stay (HOSPITAL_COMMUNITY)
Admission: EM | Admit: 2024-03-27 | Discharge: 2024-03-30 | Disposition: A | Attending: Emergency Medicine | Admitting: Emergency Medicine

## 2024-03-27 DIAGNOSIS — E785 Hyperlipidemia, unspecified: Secondary | ICD-10-CM | POA: Insufficient documentation

## 2024-03-27 DIAGNOSIS — I10 Essential (primary) hypertension: Secondary | ICD-10-CM | POA: Diagnosis not present

## 2024-03-27 DIAGNOSIS — J9611 Chronic respiratory failure with hypoxia: Secondary | ICD-10-CM | POA: Diagnosis not present

## 2024-03-27 DIAGNOSIS — J45909 Unspecified asthma, uncomplicated: Secondary | ICD-10-CM | POA: Diagnosis not present

## 2024-03-27 DIAGNOSIS — G8929 Other chronic pain: Secondary | ICD-10-CM | POA: Insufficient documentation

## 2024-03-27 DIAGNOSIS — Z96642 Presence of left artificial hip joint: Secondary | ICD-10-CM | POA: Diagnosis not present

## 2024-03-27 DIAGNOSIS — Z79899 Other long term (current) drug therapy: Secondary | ICD-10-CM | POA: Insufficient documentation

## 2024-03-27 DIAGNOSIS — F17210 Nicotine dependence, cigarettes, uncomplicated: Secondary | ICD-10-CM | POA: Diagnosis not present

## 2024-03-27 DIAGNOSIS — F411 Generalized anxiety disorder: Secondary | ICD-10-CM | POA: Diagnosis not present

## 2024-03-27 DIAGNOSIS — R0603 Acute respiratory distress: Principal | ICD-10-CM | POA: Diagnosis present

## 2024-03-27 DIAGNOSIS — G894 Chronic pain syndrome: Secondary | ICD-10-CM | POA: Diagnosis not present

## 2024-03-27 DIAGNOSIS — J441 Chronic obstructive pulmonary disease with (acute) exacerbation: Secondary | ICD-10-CM | POA: Diagnosis not present

## 2024-03-27 DIAGNOSIS — R0602 Shortness of breath: Secondary | ICD-10-CM | POA: Diagnosis present

## 2024-03-27 LAB — CBC WITH DIFFERENTIAL/PLATELET
Abs Immature Granulocytes: 0.01 K/uL (ref 0.00–0.07)
Basophils Absolute: 0 K/uL (ref 0.0–0.1)
Basophils Relative: 1 %
Eosinophils Absolute: 0.1 K/uL (ref 0.0–0.5)
Eosinophils Relative: 2 %
HCT: 37 % (ref 36.0–46.0)
Hemoglobin: 11.1 g/dL — ABNORMAL LOW (ref 12.0–15.0)
Immature Granulocytes: 0 %
Lymphocytes Relative: 21 %
Lymphs Abs: 1.2 K/uL (ref 0.7–4.0)
MCH: 29.1 pg (ref 26.0–34.0)
MCHC: 30 g/dL (ref 30.0–36.0)
MCV: 97.1 fL (ref 80.0–100.0)
Monocytes Absolute: 0.4 K/uL (ref 0.1–1.0)
Monocytes Relative: 7 %
Neutro Abs: 3.9 K/uL (ref 1.7–7.7)
Neutrophils Relative %: 69 %
Platelets: 255 K/uL (ref 150–400)
RBC: 3.81 MIL/uL — ABNORMAL LOW (ref 3.87–5.11)
RDW: 14.2 % (ref 11.5–15.5)
WBC: 5.6 K/uL (ref 4.0–10.5)
nRBC: 0 % (ref 0.0–0.2)

## 2024-03-27 LAB — COMPREHENSIVE METABOLIC PANEL WITH GFR
ALT: 12 U/L (ref 0–44)
AST: 24 U/L (ref 15–41)
Albumin: 3.7 g/dL (ref 3.5–5.0)
Alkaline Phosphatase: 40 U/L (ref 38–126)
Anion gap: 10 (ref 5–15)
BUN: 7 mg/dL — ABNORMAL LOW (ref 8–23)
CO2: 33 mmol/L — ABNORMAL HIGH (ref 22–32)
Calcium: 8.8 mg/dL — ABNORMAL LOW (ref 8.9–10.3)
Chloride: 96 mmol/L — ABNORMAL LOW (ref 98–111)
Creatinine, Ser: 0.62 mg/dL (ref 0.44–1.00)
GFR, Estimated: >60 mL/min (ref >60)
Glucose, Bld: 106 mg/dL — ABNORMAL HIGH (ref 70–99)
Potassium: 3.6 mmol/L (ref 3.5–5.1)
Sodium: 139 mmol/L (ref 135–145)
Total Bilirubin: 0.7 mg/dL (ref 0.0–1.2)
Total Protein: 6.2 g/dL — ABNORMAL LOW (ref 6.5–8.1)

## 2024-03-27 LAB — RESP PANEL BY RT-PCR (RSV, FLU A&B, COVID)  RVPGX2
Influenza A by PCR: NEGATIVE
Influenza B by PCR: NEGATIVE
Resp Syncytial Virus by PCR: NEGATIVE
SARS Coronavirus 2 by RT PCR: NEGATIVE

## 2024-03-27 LAB — BRAIN NATRIURETIC PEPTIDE: B Natriuretic Peptide: 61.6 pg/mL (ref 0.0–100.0)

## 2024-03-27 LAB — TROPONIN I (HIGH SENSITIVITY)
Troponin I (High Sensitivity): 12 ng/L (ref <18)
Troponin I (High Sensitivity): 15 ng/L (ref <18)

## 2024-03-27 LAB — ETHANOL: Alcohol, Ethyl (B): <15 mg/dL (ref <15)

## 2024-03-27 MED ORDER — MIRTAZAPINE 15 MG PO TABS
7.5000 mg | ORAL_TABLET | Freq: Every day | ORAL | Status: DC
  Administered 2024-03-27 – 2024-03-29 (×3): 7.5 mg via ORAL
  Filled 2024-03-27 (×3): qty 1

## 2024-03-27 MED ORDER — ACETAMINOPHEN 325 MG PO TABS: 650.0000 mg | ORAL_TABLET | Freq: Four times a day (QID) | ORAL | Status: DC | PRN

## 2024-03-27 MED ORDER — ATORVASTATIN CALCIUM 10 MG PO TABS
10.0000 mg | ORAL_TABLET | Freq: Every day | ORAL | Status: DC
  Administered 2024-03-27 – 2024-03-30 (×4): 10 mg via ORAL
  Filled 2024-03-27 (×4): qty 1

## 2024-03-27 MED ORDER — IPRATROPIUM-ALBUTEROL 0.5-2.5 (3) MG/3ML IN SOLN
3.0000 mL | Freq: Four times a day (QID) | RESPIRATORY_TRACT | Status: DC
  Administered 2024-03-27 – 2024-03-30 (×9): 3 mL via RESPIRATORY_TRACT
  Filled 2024-03-27 (×11): qty 3

## 2024-03-27 MED ORDER — SODIUM CHLORIDE 0.9% FLUSH
3.0000 mL | Freq: Two times a day (BID) | INTRAVENOUS | Status: DC
  Administered 2024-03-27 – 2024-03-30 (×6): 3 mL via INTRAVENOUS

## 2024-03-27 MED ORDER — ACETAMINOPHEN 650 MG RE SUPP: 650.0000 mg | Freq: Four times a day (QID) | RECTAL | Status: DC | PRN

## 2024-03-27 MED ORDER — MAGNESIUM GLUCONATE 500 (27 MG) MG PO TABS
500.0000 mg | ORAL_TABLET | ORAL | Status: AC
  Administered 2024-03-27: 500 mg via ORAL
  Filled 2024-03-27: qty 1

## 2024-03-27 MED ORDER — MELATONIN 5 MG PO TABS
5.0000 mg | ORAL_TABLET | ORAL | Status: AC
  Administered 2024-03-27: 5 mg via ORAL
  Filled 2024-03-27: qty 1

## 2024-03-27 MED ORDER — HYDRALAZINE HCL 20 MG/ML IJ SOLN: 5.0000 mg | INTRAMUSCULAR | Status: DC | PRN

## 2024-03-27 MED ORDER — FENTANYL CITRATE (PF) 50 MCG/ML IJ SOSY
12.5000 ug | PREFILLED_SYRINGE | Freq: Once | INTRAMUSCULAR | Status: AC
  Administered 2024-03-27: 12.5 ug via INTRAVENOUS
  Filled 2024-03-27: qty 1

## 2024-03-27 MED ORDER — ASPIRIN 81 MG PO CHEW
81.0000 mg | CHEWABLE_TABLET | Freq: Once | ORAL | Status: AC
Start: 2024-03-27 — End: 2024-03-27
  Administered 2024-03-27: 81 mg via ORAL
  Filled 2024-03-27: qty 1

## 2024-03-27 MED ORDER — SODIUM CHLORIDE 0.9 % IV BOLUS
1000.0000 mL | Freq: Once | INTRAVENOUS | Status: AC
  Administered 2024-03-27: 1000 mL via INTRAVENOUS

## 2024-03-27 MED ORDER — OXYCODONE-ACETAMINOPHEN 5-325 MG PO TABS
1.0000 | ORAL_TABLET | Freq: Four times a day (QID) | ORAL | Status: AC | PRN
  Administered 2024-03-27 – 2024-03-28 (×2): 1 via ORAL
  Filled 2024-03-27 (×2): qty 1

## 2024-03-27 MED ORDER — ACETAMINOPHEN 325 MG PO TABS
650.0000 mg | ORAL_TABLET | Freq: Four times a day (QID) | ORAL | Status: DC | PRN
  Administered 2024-03-27: 650 mg via ORAL
  Filled 2024-03-27: qty 2

## 2024-03-27 MED ORDER — PREDNISONE 20 MG PO TABS
40.0000 mg | ORAL_TABLET | Freq: Every day | ORAL | Status: DC
  Administered 2024-03-28 – 2024-03-30 (×3): 40 mg via ORAL
  Filled 2024-03-27 (×3): qty 2

## 2024-03-27 MED ORDER — LACTATED RINGERS IV SOLN: Freq: Once | INTRAVENOUS | Status: AC

## 2024-03-27 MED ORDER — NICOTINE 7 MG/24HR TD PT24
7.0000 mg | MEDICATED_PATCH | TRANSDERMAL | Status: DC
  Administered 2024-03-27 – 2024-03-29 (×3): 7 mg via TRANSDERMAL
  Filled 2024-03-27 (×4): qty 1

## 2024-03-27 MED ORDER — FENTANYL CITRATE (PF) 50 MCG/ML IJ SOSY: 50.0000 ug | PREFILLED_SYRINGE | Freq: Once | INTRAMUSCULAR | Status: DC

## 2024-03-27 MED ORDER — IPRATROPIUM-ALBUTEROL 0.5-2.5 (3) MG/3ML IN SOLN
3.0000 mL | Freq: Once | RESPIRATORY_TRACT | Status: AC
  Administered 2024-03-27: 3 mL via RESPIRATORY_TRACT
  Filled 2024-03-27: qty 3

## 2024-03-27 MED ORDER — ALBUTEROL SULFATE (2.5 MG/3ML) 0.083% IN NEBU
2.5000 mg | INHALATION_SOLUTION | RESPIRATORY_TRACT | Status: DC | PRN
  Administered 2024-03-28 – 2024-03-29 (×6): 2.5 mg via RESPIRATORY_TRACT
  Filled 2024-03-27 (×5): qty 3

## 2024-03-27 MED ORDER — METHYLPREDNISOLONE SODIUM SUCC 125 MG IJ SOLR
60.0000 mg | Freq: Two times a day (BID) | INTRAMUSCULAR | Status: AC
  Administered 2024-03-27: 60 mg via INTRAVENOUS
  Filled 2024-03-27 (×2): qty 2

## 2024-03-27 MED ORDER — HEPARIN SODIUM (PORCINE) 5000 UNIT/ML IJ SOLN
5000.0000 [IU] | Freq: Two times a day (BID) | INTRAMUSCULAR | Status: DC
  Administered 2024-03-27 – 2024-03-30 (×6): 5000 [IU] via SUBCUTANEOUS
  Filled 2024-03-27 (×6): qty 1

## 2024-03-27 MED ORDER — SODIUM CHLORIDE 0.9 % IV SOLN
1.0000 g | INTRAVENOUS | Status: DC
  Administered 2024-03-27 – 2024-03-29 (×3): 1 g via INTRAVENOUS
  Filled 2024-03-27 (×3): qty 10

## 2024-03-27 MED ORDER — HYDROCODONE-ACETAMINOPHEN 5-325 MG PO TABS: 1.0000 | ORAL_TABLET | Freq: Four times a day (QID) | ORAL | Status: DC | PRN

## 2024-03-27 NOTE — ED Notes (Signed)
 CCMD called and patient placed on monitor

## 2024-03-27 NOTE — H&P (Signed)
 History and Physical    Patient: [name] FMW:[id number] DOB: 11/03/1955 DOA: 03/27/2024 DOS: the patient was seen and examined on 03/27/2024 . PCP: Maree Leni Edyth DELENA, MD  Patient coming from: Home Chief complaint: Chief Complaint  Patient presents with   Shortness of Breath   HPI:  [name] is a 68 y.o. female with past medical history  of tobacco abuse/COPD/underweight/generalized anxiety disorder/essential hypertension presenting with shortness of breath.  Symptoms are progressively worsening over the past week and has been using nebulizer treatments with no relief.  Initially patient found to be hypoxic at 86% on room air, on BiPAP and stabilized currently on nasal cannula .  ED Course:  Vital signs in the ED were notable for the following:  Vitals:   03/27/24 1425 03/27/24 1450 03/27/24 1537 03/27/24 1654  BP:  94/82  115/71  Pulse:    67  Temp:      Resp:  (!) 25  (!) 22  Height:      Weight:      SpO2: 100%  100% 98%  TempSrc:      BMI (Calculated):       >>ED evaluation thus far shows: CMP showing chloride 96 bicarb 33 glucose 106 BUN of 7 normal kidney function normal LFTs. CBC showing normal white count of 5.6, hemoglobin of 11.1 platelets of 255. Respiratory panel is pending. CT chest ordered by ED provider is pending.  Chart review shows patient also had a CT soft tissue neck without contrast on 27 October of this year, results are pending. In the ED emergency room patient's clinically improved and is currently on nasal cannula from initial NIPPV therapy initiated by EMS.  Next Chest x-ray negative for any acute cardiopulmonary process.   >>While in the ED patient received the following: Medications  ipratropium-albuterol  (DUONEB) 0.5-2.5 (3) MG/3ML nebulizer solution 3 mL (has no administration in time range)  fentaNYL  (SUBLIMAZE ) injection 50 mcg (50 mcg Intravenous Patient Refused/Not Given 03/27/24 1630)  fentaNYL  (SUBLIMAZE ) injection 12.5 mcg  (12.5 mcg Intravenous Given 03/27/24 1457)  aspirin  chewable tablet 81 mg (81 mg Oral Given 03/27/24 1504)  sodium chloride  0.9 % bolus 1,000 mL (1,000 mLs Intravenous New Bag/Given 03/27/24 1457)   Review of Systems  Respiratory:  Positive for shortness of breath.    Past Medical History:  Diagnosis Date   Anxiety    Arthritis    Asthma    Chest pain 2006   Chest pain 10/2016   Chronic back pain    COPD (chronic obstructive pulmonary disease) (HCC)    Hypertension    Sciatica    Shortness of breath    due to  smoking    Past Surgical History:  Procedure Laterality Date   ABDOMINAL HYSTERECTOMY  1997   CARDIAC CATHETERIZATION  2006   HEMI-MICRODISCECTOMY LUMBAR LAMINECTOMY LEVEL 1  05/14/2012   Procedure: HEMI-MICRODISCECTOMY LUMBAR LAMINECTOMY LEVEL 1;  Surgeon: Tanda DELENA Heading, MD;  Location: [facility];  Service: Orthopedics;  Laterality: N/A;  Hemi Laminectomy Microdiscectomy L4 - L5 Central (X-Ray)   ingrown toenail removal     TONSILLECTOMY     TOTAL HIP ARTHROPLASTY Left 11/05/2013   Procedure: LEFT TOTAL HIP ARTHROPLASTY;  Surgeon: Tanda DELENA Heading, MD;  Location: [facility];  Service: Orthopedics;  Laterality: Left;    reports that she has been smoking cigarettes. She has a 3 pack-year smoking history. She has never used smokeless tobacco. She reports that she does not drink alcohol and does  not use drugs. No Known Allergies Family History  Problem Relation Age of Onset   Heart attack Mother 52   Hypertension Sister    Diabetes type II Sister    Tongue cancer Brother    Heart attack Sister 85   Lung cancer Sister    Hypertension Sister    Prior to Admission medications   Medication Sig Start Date End Date Taking? Authorizing Provider  albuterol  (PROVENTIL ) (2.5 MG/3ML) 0.083% nebulizer solution Take 2.5 mg by nebulization every 6 (six) hours as needed for wheezing or shortness of breath.    [provider]  albuterol  (VENTOLIN  HFA) 108 (90 Base) MCG/ACT  inhaler Inhale 2 puffs into the lungs every 4 (four) hours as needed for wheezing or shortness of breath. 07/24/23   Kara Dorn NOVAK, MD  atorvastatin  (LIPITOR) 10 MG tablet Take 10 mg by mouth at bedtime.    [provider]  benzonatate  (TESSALON ) 100 MG capsule Take 1 capsule (100 mg total) by mouth 3 (three) times daily as needed for cough. 01/02/24   Pokhrel, Laxman, MD  enalapril  (VASOTEC ) 10 MG tablet Take 1 tablet (10 mg total) by mouth daily. 01/21/24   [name], MD  famotidine  (PEPCID ) 40 MG tablet Take 40 mg by mouth daily as needed for heartburn or indigestion.    [provider]  feeding supplement (ENSURE ENLIVE / ENSURE PLUS) LIQD Take 237 mLs by mouth 3 (three) times daily between meals. Patient not taking: Reported on 01/01/2024 09/06/23   Rizwan, Saima, MD  fluticasone -salmeterol (ADVAIR  DISKUS) 250-50 MCG/ACT AEPB Inhale 1 puff into the lungs in the morning and at bedtime. 01/02/24   Pokhrel, Vernal, MD  hydrOXYzine  (ATARAX ) 25 MG tablet Take 25 mg by mouth 3 (three) times daily as needed for anxiety. 09/25/23   [provider]  ipratropium-albuterol  (DUONEB) 0.5-2.5 (3) MG/3ML SOLN Take 3 mLs by nebulization every 6 (six) hours as needed. Patient taking differently: Take 3 mLs by nebulization 3 (three) times daily. 12/06/23   Akula, Vijaya, MD  mirtazapine  (REMERON ) 7.5 MG tablet Take 7.5 mg by mouth at bedtime. 09/26/23   [provider]  Multiple Vitamin (MULTIVITAMIN) capsule Take 1 capsule by mouth daily with breakfast.    [provider]  nicotine  (NICODERM CQ  - DOSED IN MG/24 HR) 7 mg/24hr patch Place 7 mg onto the skin See admin instructions. Apply 1 new patch to the skin once a day for smoking cessation while hospitalized    [provider]  oxyCODONE -acetaminophen  (PERCOCET) 10-325 MG tablet Take 1 tablet by mouth See admin instructions. Take 1 tablet by mouth every 4-5 hours 12/23/20   [provider]  OXYGEN  Inhale 3 L/min into the lungs as needed (for shortness of breath).    [provider]  polyethylene glycol (MIRALAX  / GLYCOLAX ) 17 g packet Take 17 g by mouth daily as needed for mild constipation. 05/27/23   Tobie Yetta HERO, MD  potassium chloride  SA (KLOR-CON  M) 20 MEQ tablet Take 1 tablet (20 mEq total) by mouth daily. 02/26/24   Griselda Norris, MD  predniSONE  (DELTASONE ) 10 MG tablet Take 4 tablets (40 mg total) by mouth daily. 02/26/24   Griselda Norris, MD  PRESCRIPTION MEDICATION See admin instructions. CPAP- At bedtime    [provider]  zolpidem  (AMBIEN ) 5 MG tablet Take 5 mg by mouth at bedtime. 12/25/20   [provider]  Vitals:   03/27/24 1425 03/27/24 1450 03/27/24 1537 03/27/24 1654  BP:  94/82  115/71  Pulse:    67  Resp:  (!) 25  (!) 22  Temp:      TempSrc:      SpO2: 100%  100% 98%  Weight:      Height:       Physical Exam Vitals reviewed.  Constitutional:      General: She is not in acute distress.    Appearance: She is cachectic. She is not ill-appearing.     Interventions: Nasal cannula in place.  HENT:     Head: Normocephalic.  Eyes:     Extraocular Movements: Extraocular movements intact.  Cardiovascular:     Rate and Rhythm: Normal rate and regular rhythm.     Heart sounds: Normal heart sounds.  Pulmonary:     Effort: No tachypnea or respiratory distress.     Breath sounds: Decreased air movement present. Examination of the right-upper field reveals wheezing. Examination of the left-upper field reveals wheezing. Examination of the right-middle field reveals wheezing. Examination of the left-middle field reveals wheezing. Examination of the right-lower field reveals wheezing. Examination of the left-lower field reveals wheezing. Wheezing present.     Comments: Inspiratory and expiratory wheeze. Abdominal:     General: There is no distension.      Palpations: Abdomen is soft.     Tenderness: There is no abdominal tenderness.  Musculoskeletal:     Right lower leg: No edema.     Left lower leg: No edema.  Neurological:     General: No focal deficit present.     Mental Status: She is alert and oriented to person, place, and time.     Labs on Admission: I have personally reviewed following labs and imaging studies CBC: Recent Labs  Lab 03/27/24 1342  WBC 5.6  NEUTROABS 3.9  HGB 11.1*  HCT 37.0  MCV 97.1  PLT 255   Basic Metabolic Panel: Recent Labs  Lab 03/27/24 1342  NA 139  K 3.6  CL 96*  CO2 33*  GLUCOSE 106*  BUN 7*  CREATININE 0.62  CALCIUM  8.8*   GFR: Estimated Creatinine Clearance: 44.5 mL/min (by C-G formula based on SCr of 0.62 mg/dL). Liver Function Tests: Recent Labs  Lab 03/27/24 1342  AST 24  ALT 12  ALKPHOS 40  BILITOT 0.7  PROT 6.2*  ALBUMIN 3.7   No results for input(s): LIPASE, AMYLASE in the last 168 hours. No results for input(s): AMMONIA in the last 168 hours. Recent Labs    10/12/23 1116 12/02/23 2209 12/03/23 0102 12/05/23 1448 01/01/24 1033 01/02/24 0522 01/21/24 1213 02/18/24 1847 02/25/24 1659 03/27/24 1342  BUN 17 9 9 16 10 16  24* 13 23 7*  CREATININE 0.52 0.65 0.54 0.74 0.69 0.57 1.38* 0.73 0.99 0.62    Cardiac Enzymes: No results for input(s): CKTOTAL, CKMB, CKMBINDEX, TROPONINI in the last 168 hours. BNP (last 3 results) Recent Labs    02/26/24 0031  PROBNP 105.0   HbA1C: No results for input(s): HGBA1C in the last 72 hours. CBG: No results for input(s): GLUCAP in the last 168 hours. Lipid Profile: No results for input(s): CHOL, HDL, LDLCALC, TRIG, CHOLHDL, LDLDIRECT in the last 72 hours. Thyroid  Function Tests: No results for input(s): TSH, T4TOTAL, FREET4, T3FREE, THYROIDAB in the last 72 hours. Anemia Panel: No results for input(s): VITAMINB12, FOLATE, FERRITIN, TIBC, IRON, RETICCTPCT in the last 72  hours. Urine analysis:    Component  Value Date/Time   COLORURINE YELLOW 01/21/2024 1557   APPEARANCEUR HAZY (A) 01/21/2024 1557   LABSPEC 1.015 01/21/2024 1557   PHURINE 5.0 01/21/2024 1557   GLUCOSEU NEGATIVE 01/21/2024 1557   HGBUR NEGATIVE 01/21/2024 1557   BILIRUBINUR NEGATIVE 01/21/2024 1557   KETONESUR NEGATIVE 01/21/2024 1557   PROTEINUR NEGATIVE 01/21/2024 1557   UROBILINOGEN 0.2 10/29/2013 1114   NITRITE NEGATIVE 01/21/2024 1557   LEUKOCYTESUR NEGATIVE 01/21/2024 1557   Radiological Exams on Admission: DG Chest Port 1 View Result Date: 03/27/2024 EXAM: 1 VIEW(S) XRAY OF THE CHEST 03/27/2024 02:35:00 PM COMPARISON: 02/25/2024 CLINICAL HISTORY: SOB FINDINGS: LUNGS AND PLEURA: Emphysema. No focal pulmonary opacity. No pulmonary edema. No pleural effusion. No pneumothorax. HEART AND MEDIASTINUM: Tortuous aorta with aortic atherosclerosis. No acute abnormality of the cardiac and mediastinal silhouettes. BONES AND SOFT TISSUES: Multilevel thoracic osteophytosis. No acute osseous abnormality. IMPRESSION: 1. No acute cardiopulmonary process. 2. Emphysema. Electronically signed by: Norleen Boxer MD 03/27/2024 03:27 PM EDT RP Workstation: [id number]   Data Reviewed: Relevant notes from primary care and specialist visits, past discharge summaries as available in EHR, including Care Everywhere . Prior diagnostic testing as pertinent to current admission diagnoses, Updated medications and problem lists for reconciliation .ED course, including vitals, labs, imaging, treatment and response to treatment,Triage notes, nursing and pharmacy notes and ED provider's notes.Notable results as noted in HPI.Discussed case with EDMD/ ED APP/ or Specialty MD on call and as needed.  Assessment & Plan    >> Respiratory distress/acute respiratory failure with hypoxia: COPD exacerbation: Plan admitted to telemetry unit with pulse oximetry. COPD order set utilized.  DuoNebs MDI steroid taper incentive  spirometry.  Tobacco cessation. Blood gas as deemed appropriate needed.   >> Tobacco abuse: Nicotine  patch.   >> Essential hypertension: Vitals:   03/27/24 1335 03/27/24 1450 03/27/24 1654  BP: 117/82 94/82 115/71  Low and fluctuating, PTA meds include enalapril  10, I will currently hold that.  Gentle IV fluid hydration overnight.   >> Anemia: New and mild will follow and PCP to further evaluate with repeat CBC testing if unresolved.    Latest Ref Rng & Units 03/27/2024    1:42 PM 02/25/2024    4:59 PM 02/18/2024    6:47 PM  CBC  WBC 4.0 - 10.5 K/uL 5.6  5.8  6.3   Hemoglobin 12.0 - 15.0 g/dL 88.8  86.8  87.1   Hematocrit 36.0 - 46.0 % 37.0  42.8  42.9   Platelets 150 - 400 K/uL 255  200  284       DVT prophylaxis:  Heparin  Consults:  None  Advance Care Planning:    Code Status: Full Code   Family Communication:  None Disposition Plan:  Home Severity of Illness: The appropriate patient status for this patient is OBSERVATION. Observation status is judged to be reasonable and necessary in order to provide the required intensity of service to ensure the patient's safety. The patient's presenting symptoms, physical exam findings, and initial radiographic and laboratory data in the context of their medical condition is felt to place them at decreased risk for further clinical deterioration. Furthermore, it is anticipated that the patient will be medically stable for discharge from the hospital within 2 midnights of admission.   Unresulted Labs (From admission, onward)     Start     Ordered   03/28/24 0500  Basic metabolic panel  Tomorrow morning,   R        03/27/24 1703   03/28/24 0500  CBC  Tomorrow morning,   R        03/27/24 1703   03/27/24 1727  Ethanol  Add-on,   AD        03/27/24 1726   03/27/24 1727  Urine drugs of abuse scrn w alc, routine (Ref Lab)  Add-on,   AD        03/27/24 1726            Meds ordered this encounter  Medications   fentaNYL   (SUBLIMAZE ) injection 12.5 mcg   aspirin  chewable tablet 81 mg   sodium chloride  0.9 % bolus 1,000 mL   ipratropium-albuterol  (DUONEB) 0.5-2.5 (3) MG/3ML nebulizer solution 3 mL   DISCONTD: fentaNYL  (SUBLIMAZE ) injection 50 mcg   atorvastatin  (LIPITOR) tablet 10 mg   mirtazapine  (REMERON ) tablet 7.5 mg   nicotine  (NICODERM CQ  - dosed in mg/24 hr) patch 7 mg   FOLLOWED BY Linked Order Group    methylPREDNISolone  sodium succinate (SOLU-MEDROL ) 125 mg/2 mL injection 60 mg     IV methylprednisolone  will be converted to either a q12h or q24h frequency with the same total daily dose (TDD).  Ordered Dose: 1 to 125 mg TDD; convert to: TDD q24h.  Ordered Dose: 126 to 250 mg TDD; convert to: TDD div q12h.  Ordered Dose: >250 mg TDD; DAW.    predniSONE  (DELTASONE ) tablet 40 mg   ipratropium-albuterol  (DUONEB) 0.5-2.5 (3) MG/3ML nebulizer solution 3 mL   albuterol  (PROVENTIL ) (2.5 MG/3ML) 0.083% nebulizer solution 2.5 mg   sodium chloride  flush (NS) 0.9 % injection 3 mL   lactated ringers  infusion   DISCONTD: acetaminophen  (TYLENOL ) tablet 650 mg   DISCONTD: acetaminophen  (TYLENOL ) suppository 650 mg   hydrALAZINE  (APRESOLINE ) injection 5 mg   cefTRIAXone  (ROCEPHIN ) 1 g in sodium chloride  0.9 % 100 mL IVPB    Antibiotic Indication::   Other Indication (list below)    Other Indication::   COPD exacerbation   OR Linked Order Group    acetaminophen  (TYLENOL ) tablet 650 mg    acetaminophen  (TYLENOL ) suppository 650 mg   HYDROcodone -acetaminophen  (NORCO/VICODIN) 5-325 MG per tablet 1 tablet    Refill:  0   heparin  injection 5,000 Units     Orders Placed This Encounter  Procedures   Resp panel by RT-PCR (RSV, Flu A&B, Covid) Anterior Nasal Swab   DG Chest Port 1 View   Comprehensive metabolic panel   CBC with Differential/Platelet   Brain natriuretic peptide   Basic metabolic panel   CBC   Ethanol   Urine drugs of abuse scrn w alc, routine (Ref Lab)   Diet Heart Room service appropriate?  Yes; Fluid consistency: Thin   ED Cardiac monitoring   Initiate Carrier Fluid Protocol   Refer to Sidebar Report:   Apply Chronic Obstructive Pulmonary Disease Care Plan   Provide Smoking / Tobacco cessation education.  Provide education material and information on community counseling.   Cardiac Monitoring Continuous x 24 hours Indications for use: Other; other indications for use: COPD exacerbation   Maintain IV access   Vital signs   Notify physician (specify)   Mobility Protocol: No Restrictions   Refer to Sidebar Report Mobility Protocol for Adult Inpatient   Initiate Adult Central Line Maintenance and Catheter Clearance Protocol for patients with central line (CVC, PICC, Port, Hemodialysis, Trialysis)   If patient diabetic or glucose greater than 140 notify physician for Sliding Scale Insulin Orders   Intake and Output   Initiate CHG Protocol for patients in ICU/SD or any  patient with a central line or foley catheter   Do not place and if present remove PureWick   Initiate Oral Care Protocol   Initiate Carrier Fluid Protocol   Nurse to provide smoking / tobacco cessation education   RN may order General Admission PRN Orders utilizing General Admission PRN medications (through manage orders) for the following patient needs: allergy symptoms (Claritin), cold sores (Carmex), cough (Robitussin DM), eye irritation (Liquifilm Tears), hemorrhoids (Tucks), indigestion (Maalox), minor skin irritation (Hydrocortisone  Cream), muscle pain Lucienne Gay), nose irritation (saline nasal spray) and sore throat (Chloraseptic spray).   SCDs   Ambulate with assistance   Full code   Consult to hospitalist   Nutritional services consult   Consult to Transition of Care Team   Consult to respiratory care treatment   OT eval and treat   PT eval and treat   ED Pulse oximetry, continuous   Pulse oximetry check with vital signs   Oxygen therapy Mode or (Route): Nasal cannula; Liters Per Minute: 2; Keep O2  saturation between: greater than 92 %   ED EKG   EKG 12-Lead   Saline lock IV   Place in observation (patient's expected length of stay will be less than 2 midnights)   Aspiration precautions   Fall precautions    Author: Mario LULLA Blanch, MD 12 pm- 8 pm. [facility]. 03/27/2024 5:33 PM Please note for any communication after hours contact [facility] Assigned provider on call on Amion.

## 2024-03-27 NOTE — ED Provider Notes (Signed)
 [place] EMERGENCY DEPARTMENT AT [facility] Provider Note   CSN: [id number] Arrival date & time: 03/27/24  1327     Patient presents with: Shortness of Breath   [name] is a 68 y.o. female.   HPI Pt presents to the ED via [facility] from home cc of sob that has been worsening over the past week. Pt denies illness. Pt has used nebulizer treatments and inhalers today with no relief. Per EMS, pt had no breath sounds and was 86% RA upon their arrival.     Prior to Admission medications   Medication Sig Start Date End Date Taking? Authorizing Provider  albuterol  (PROVENTIL ) (2.5 MG/3ML) 0.083% nebulizer solution Take 2.5 mg by nebulization every 6 (six) hours as needed for wheezing or shortness of breath.    [provider]  albuterol  (VENTOLIN  HFA) 108 (90 Base) MCG/ACT inhaler Inhale 2 puffs into the lungs every 4 (four) hours as needed for wheezing or shortness of breath. 07/24/23   Kara Dorn NOVAK, MD  atorvastatin  (LIPITOR) 10 MG tablet Take 10 mg by mouth at bedtime.    [provider]  benzonatate  (TESSALON ) 100 MG capsule Take 1 capsule (100 mg total) by mouth 3 (three) times daily as needed for cough. 01/02/24   Pokhrel, Vernal, MD  enalapril  (VASOTEC ) 10 MG tablet Take 1 tablet (10 mg total) by mouth daily. 01/21/24   [name], MD  famotidine  (PEPCID ) 40 MG tablet Take 40 mg by mouth daily as needed for heartburn or indigestion.    [provider]  feeding supplement (ENSURE ENLIVE / ENSURE PLUS) LIQD Take 237 mLs by mouth 3 (three) times daily between meals. Patient not taking: Reported on 01/01/2024 09/06/23   Rizwan, Saima, MD  fluticasone -salmeterol (ADVAIR  DISKUS) 250-50 MCG/ACT AEPB Inhale 1 puff into the lungs in the morning and at bedtime. 01/02/24   Pokhrel, Vernal, MD  hydrOXYzine  (ATARAX ) 25 MG tablet Take 25 mg by mouth 3 (three) times daily as needed for anxiety. 09/25/23   [provider]  ipratropium-albuterol   (DUONEB) 0.5-2.5 (3) MG/3ML SOLN Take 3 mLs by nebulization every 6 (six) hours as needed. Patient taking differently: Take 3 mLs by nebulization 3 (three) times daily. 12/06/23   Akula, Vijaya, MD  mirtazapine  (REMERON ) 7.5 MG tablet Take 7.5 mg by mouth at bedtime. 09/26/23   [provider]  Multiple Vitamin (MULTIVITAMIN) capsule Take 1 capsule by mouth daily with breakfast.    [provider]  nicotine  (NICODERM CQ  - DOSED IN MG/24 HR) 7 mg/24hr patch Place 7 mg onto the skin See admin instructions. Apply 1 new patch to the skin once a day for smoking cessation while hospitalized    [provider]  oxyCODONE -acetaminophen  (PERCOCET) 10-325 MG tablet Take 1 tablet by mouth See admin instructions. Take 1 tablet by mouth every 4-5 hours 12/23/20   [provider]  OXYGEN Inhale 3 L/min into the lungs as needed (for shortness of breath).    [provider]  polyethylene glycol (MIRALAX  / GLYCOLAX ) 17 g packet Take 17 g by mouth daily as needed for mild constipation. 05/27/23   Tobie Yetta HERO, MD  potassium chloride  SA (KLOR-CON  M) 20 MEQ tablet Take 1 tablet (20 mEq total) by mouth daily. 02/26/24   Griselda Norris, MD  predniSONE  (DELTASONE ) 10 MG tablet Take 4 tablets (40 mg total) by mouth daily. 02/26/24   Griselda Norris, MD  PRESCRIPTION MEDICATION See admin instructions. CPAP- At bedtime    [provider]  zolpidem  (AMBIEN ) 5 MG tablet Take 5 mg by mouth at bedtime. 12/25/20   [provider]    Allergies: Patient has no known allergies.    Review of Systems  Updated Vital Signs BP 117/82 (BP Location: Right Arm)   Pulse 80   Temp (!) 97.4 F (36.3 C) (Axillary)   Resp 16   Ht 1.549 m (5' 1)   Wt 43.1 kg   SpO2 100%   BMI 17.95 kg/m   Physical Exam Vitals and nursing note reviewed.  Constitutional:      General: She is in acute distress.     Appearance: She is ill-appearing.  HENT:     Head: Normocephalic and  atraumatic.  Eyes:     Conjunctiva/sclera: Conjunctivae normal.  Cardiovascular:     Rate and Rhythm: Regular rhythm. Tachycardia present.  Pulmonary:     Effort: Tachypnea, accessory muscle usage and respiratory distress present.     Breath sounds: Decreased breath sounds present.  Abdominal:     General: There is no distension.  Skin:    General: Skin is warm and dry.  Neurological:     Mental Status: She is alert and oriented to person, place, and time.     Cranial Nerves: No cranial nerve deficit.     Motor: Atrophy present.  Psychiatric:        Mood and Affect: Mood normal.     (all labs ordered are listed, but only abnormal results are displayed) Labs Reviewed  CBC WITH DIFFERENTIAL/PLATELET - Abnormal; Notable for the following components:      Result Value   RBC 3.81 (*)    Hemoglobin 11.1 (*)    All other components within normal limits  RESP PANEL BY RT-PCR (RSV, FLU A&B, COVID)  [id number]  COMPREHENSIVE METABOLIC PANEL WITH GFR  BRAIN NATRIURETIC PEPTIDE    EKG: None  Radiology: No results found.   Procedures   Medications Ordered in the ED - No data to display                                  Medical Decision Making Ill-appearing elderly female with COPD presents in respiratory distress.  Patient is afebrile, but with tachycardia, tachypnea, concern for pneumonia, COPD exacerbation, bacteremia, sepsis. Less likely heart failure given her habitus, unlikely ACS given denial of chest pain that is substantial.  Amount and/or Complexity of Data Reviewed Independent Historian: EMS External Data Reviewed: notes. Labs: ordered. Decision-making details documented in ED Course. Radiology: ordered and independent interpretation performed. Decision-making details documented in ED Course. ECG/medicine tests: ordered and independent interpretation performed. Decision-making details documented in ED Course.   2:52 PM Patient now moving substantially more air than  on initial interpretation of lung sounds.  Patient will stop BiPAP, continue supplemental oxygen.  Initial labs noncontributory, BNP, troponin pending.  Elderly female with COPD, still smoking, now presents in respiratory distress.  Patient improved markedly with bronchodilators, steroids, was able to transition from BiPAP, is awaiting additional labs, will require admission given concern for COPD exacerbation +/- complications.  CRITICAL CARE Performed by: Lamar Salen Total critical care time: 35 minutes Critical care time was exclusive of separately billable procedures and treating other patients. Critical care was necessary to treat or prevent imminent or life-threatening deterioration. Critical care was time spent personally by me on the following activities: development of treatment plan with patient and/or surrogate as well  as nursing, discussions with consultants, evaluation of patient's response to treatment, examination of patient, obtaining history from patient or surrogate, ordering and performing treatments and interventions, ordering and review of laboratory studies, ordering and review of radiographic studies, pulse oximetry and re-evaluation of patient's condition.   Final diagnoses:  Respiratory distress     [name], MD 03/27/24 1455

## 2024-03-27 NOTE — ED Provider Notes (Signed)
  Physical Exam  BP 115/71   Pulse 67   Temp (!) 97.4 F (36.3 C) (Axillary)   Resp (!) 22   Ht 5' 1 (1.549 m)   Wt 43.1 kg   SpO2 98%   BMI 17.95 kg/m   Physical Exam Vitals and nursing note reviewed.  HENT:     Head: Normocephalic and atraumatic.  Eyes:     Pupils: Pupils are equal, round, and reactive to light.  Cardiovascular:     Rate and Rhythm: Normal rate and regular rhythm.  Pulmonary:     Effort: Pulmonary effort is normal.     Breath sounds: Examination of the right-upper field reveals wheezing. Examination of the left-upper field reveals wheezing. Wheezing present.  Abdominal:     Palpations: Abdomen is soft.     Tenderness: There is no abdominal tenderness.  Skin:    General: Skin is warm and dry.  Neurological:     Mental Status: She is alert.  Psychiatric:        Mood and Affect: Mood normal.     Procedures  Procedures  ED Course / MDM   Clinical Course as of 03/27/24 1726  Fri Mar 27, 2024  1650 Chest x-ray now shows no focal consolidation.  No leukocytosis.  Patient has remained stable on supplemental oxygen via nasal cannula.  Will give another breathing treatment here with DuoNeb.  Discussed with Dr. Tobie who will admit patient to hospitalist service [MP]    Clinical Course User Index [MP] Pamella Ozell LABOR, DO   Medical Decision Making I, Ozell Pamella DO, have assumed care of this patient from the previous provider pending remainder of ED workup reevaluation and likely admission  Amount and/or Complexity of Data Reviewed Labs: ordered. Radiology: ordered.  Risk OTC drugs. Prescription drug management. Decision regarding hospitalization.          Pamella Ozell LABOR, DO 03/27/24 1726

## 2024-03-27 NOTE — ED Triage Notes (Signed)
 Pt presents to the ED via [facility] from home cc of sob that has been worsening over the past week. Pt denies illness. Pt has used nebulizer treatments and inhalers today with no relief. Per EMS, pt had no breath sounds and was 86% RA upon their arrival.

## 2024-03-28 DIAGNOSIS — G894 Chronic pain syndrome: Secondary | ICD-10-CM | POA: Insufficient documentation

## 2024-03-28 DIAGNOSIS — E785 Hyperlipidemia, unspecified: Secondary | ICD-10-CM | POA: Insufficient documentation

## 2024-03-28 DIAGNOSIS — J441 Chronic obstructive pulmonary disease with (acute) exacerbation: Secondary | ICD-10-CM

## 2024-03-28 DIAGNOSIS — G8929 Other chronic pain: Secondary | ICD-10-CM | POA: Insufficient documentation

## 2024-03-28 LAB — CBC
HCT: 34.8 % — ABNORMAL LOW (ref 36.0–46.0)
Hemoglobin: 10.6 g/dL — ABNORMAL LOW (ref 12.0–15.0)
MCH: 29.4 pg (ref 26.0–34.0)
MCHC: 30.5 g/dL (ref 30.0–36.0)
MCV: 96.7 fL (ref 80.0–100.0)
Platelets: 235 K/uL (ref 150–400)
RBC: 3.6 MIL/uL — ABNORMAL LOW (ref 3.87–5.11)
RDW: 14.2 % (ref 11.5–15.5)
WBC: 3 K/uL — ABNORMAL LOW (ref 4.0–10.5)
nRBC: 0 % (ref 0.0–0.2)

## 2024-03-28 LAB — BASIC METABOLIC PANEL WITH GFR
Anion gap: 11 (ref 5–15)
BUN: 9 mg/dL (ref 8–23)
CO2: 32 mmol/L (ref 22–32)
Calcium: 8.9 mg/dL (ref 8.9–10.3)
Chloride: 98 mmol/L (ref 98–111)
Creatinine, Ser: 0.6 mg/dL (ref 0.44–1.00)
GFR, Estimated: >60 mL/min (ref >60)
Glucose, Bld: 153 mg/dL — ABNORMAL HIGH (ref 70–99)
Potassium: 4.1 mmol/L (ref 3.5–5.1)
Sodium: 141 mmol/L (ref 135–145)

## 2024-03-28 MED ORDER — HYDROXYZINE HCL 10 MG PO TABS
10.0000 mg | ORAL_TABLET | Freq: Once | ORAL | Status: AC
  Administered 2024-03-28: 10 mg via ORAL
  Filled 2024-03-28 (×2): qty 1

## 2024-03-28 MED ORDER — ENALAPRIL MALEATE 10 MG PO TABS
10.0000 mg | ORAL_TABLET | Freq: Every day | ORAL | Status: DC
  Administered 2024-03-28 – 2024-03-30 (×3): 10 mg via ORAL
  Filled 2024-03-28 (×3): qty 1

## 2024-03-28 MED ORDER — OXYCODONE-ACETAMINOPHEN 5-325 MG PO TABS
1.0000 | ORAL_TABLET | ORAL | Status: DC | PRN
  Administered 2024-03-28 – 2024-03-30 (×9): 1 via ORAL
  Filled 2024-03-28 (×11): qty 1

## 2024-03-28 MED ORDER — HYDROXYZINE HCL 10 MG PO TABS
10.0000 mg | ORAL_TABLET | Freq: Two times a day (BID) | ORAL | Status: DC | PRN
  Administered 2024-03-28 – 2024-03-29 (×3): 10 mg via ORAL
  Filled 2024-03-28 (×6): qty 1

## 2024-03-28 MED ORDER — BUSPIRONE HCL 5 MG PO TABS
7.5000 mg | ORAL_TABLET | Freq: Two times a day (BID) | ORAL | Status: DC
  Administered 2024-03-28 – 2024-03-30 (×5): 7.5 mg via ORAL
  Filled 2024-03-28 (×5): qty 2

## 2024-03-28 MED ORDER — OXYCODONE-ACETAMINOPHEN 10-325 MG PO TABS: 1.0000 | ORAL_TABLET | ORAL | Status: DC | PRN

## 2024-03-28 MED ORDER — OXYCODONE HCL 5 MG PO TABS
5.0000 mg | ORAL_TABLET | ORAL | Status: DC | PRN
  Administered 2024-03-28 – 2024-03-30 (×8): 5 mg via ORAL
  Filled 2024-03-28 (×10): qty 1

## 2024-03-28 NOTE — Care Management Obs Status (Signed)
 MEDICARE OBSERVATION STATUS NOTIFICATION   Patient Details  Name: [name] MRN: [id number] Date of Birth: 09-21-1955   Medicare Observation Status Notification Given:  Yes    Jaree Trinka G., RN 03/28/2024, 9:01 AM

## 2024-03-28 NOTE — Plan of Care (Signed)
Problem: Clinical Measurements: Goal: Ability to maintain clinical measurements within normal limits will improve Outcome: Progressing Goal: Will remain free from infection Outcome: Progressing Goal: Respiratory complications will improve Outcome: Progressing   Problem: Activity: Goal: Risk for activity intolerance will decrease Outcome: Progressing   

## 2024-03-28 NOTE — Evaluation (Signed)
 Physical Therapy Evaluation Patient Details Name: [name] MRN: [id number] DOB: 30-Jul-1955 Today's Date: 03/28/2024  History of Present Illness  Pt is 68 year old presented to [facility] on  03/27/24 for SOB. Pt with acute respiratory failure with hypoxia due to copd exacerbation. Initially placed on bipap. PMH - copd, htn, anxiety, lt THR, back pain  Clinical Impression  Pt presents to PT with slightly decr moblity to illness and inactivity. Expect pt will make good progress back to baseline with mobility. Will follow acutely but doubt pt will need PT after DC.          If plan is discharge home, recommend the following:     Can travel by private vehicle        Equipment Recommendations None recommended by PT  Recommendations for Other Services       Functional Status Assessment Patient has had a recent decline in their functional status and demonstrates the ability to make significant improvements in function in a reasonable and predictable amount of time.     Precautions / Restrictions Precautions Precautions: None Restrictions Weight Bearing Restrictions Per Provider Order: No      Mobility  Bed Mobility               General bed mobility comments: Pt sitting EOB    Transfers Overall transfer level: Needs assistance Equipment used: None Transfers: Sit to/from Stand Sit to Stand: Supervision                Ambulation/Gait Ambulation/Gait assistance: Supervision Gait Distance (Feet): 260 Feet Assistive device: None, Rollator (4 wheels) Gait Pattern/deviations: Step-through pattern, Decreased stride length Gait velocity: decr Gait velocity interpretation: 1.31 - 2.62 ft/sec, indicative of limited community ambulator   General Gait Details: More confident using rolling or other UE support  Stairs            Wheelchair Mobility     Tilt Bed    Modified Rankin (Stroke Patients Only)       Balance Overall balance assessment: Mild  deficits observed, not formally tested                                           Pertinent Vitals/Pain Pain Assessment Pain Assessment: No/denies pain    Home Living Family/patient expects to be discharged to:: Private residence Living Arrangements: Alone Available Help at Discharge: Family;Available PRN/intermittently Type of Home: Apartment Home Access: Level entry       Home Layout: One level Home Equipment: Cane - single point      Prior Function Prior Level of Function : Independent/Modified Independent                     Extremity/Trunk Assessment   Upper Extremity Assessment Upper Extremity Assessment: Defer to OT evaluation    Lower Extremity Assessment Lower Extremity Assessment: Generalized weakness       Communication   Communication Communication: No apparent difficulties    Cognition Arousal: Alert Behavior During Therapy: WFL for tasks assessed/performed   PT - Cognitive impairments: No apparent impairments                         Following commands: Intact       Cueing Cueing Techniques: Verbal cues     General Comments General comments (skin integrity, edema, etc.): SpO2 97-99%  on 3L with amb    Exercises     Assessment/Plan    PT Assessment Patient needs continued PT services  PT Problem List Decreased strength;Decreased balance;Decreased mobility       PT Treatment Interventions DME instruction;Gait training;Functional mobility training;Therapeutic activities;Therapeutic exercise;Balance training;Patient/family education    PT Goals (Current goals can be found in the Care Plan section)  Acute Rehab PT Goals Patient Stated Goal: return home PT Goal Formulation: With patient Time For Goal Achievement: 04/11/24 Potential to Achieve Goals: Good    Frequency Min 2X/week     Co-evaluation               AM-PAC PT 6 Clicks Mobility  Outcome Measure Help needed turning from your  back to your side while in a flat bed without using bedrails?: None Help needed moving from lying on your back to sitting on the side of a flat bed without using bedrails?: None Help needed moving to and from a bed to a chair (including a wheelchair)?: A Little Help needed standing up from a chair using your arms (e.g., wheelchair or bedside chair)?: A Little Help needed to walk in hospital room?: A Little Help needed climbing 3-5 steps with a railing? : A Little 6 Click Score: 20    End of Session Equipment Utilized During Treatment: Oxygen Activity Tolerance: Patient tolerated treatment well Patient left: in bed;with call bell/phone within reach;with bed alarm set (sitting EOB)   PT Visit Diagnosis: Muscle weakness (generalized) (M62.81);Other abnormalities of gait and mobility (R26.89)    Time: 8873-8847 PT Time Calculation (min) (ACUTE ONLY): 26 min   Charges:   PT Evaluation $PT Eval Low Complexity: 1 Low PT Treatments $Gait Training: 8-22 mins PT General Charges $$ ACUTE PT VISIT: 1 Visit         [facility] PT Acute Rehabilitation Services Office [phone]   [facility] ORN [facility] 03/28/2024, 1:28 PM

## 2024-03-28 NOTE — Progress Notes (Signed)
 PROGRESS NOTE    [name]  FMW:[id number] DOB: [birth year] DOA: 03/27/2024 PCP: Maree Leni Edyth DELENA, MD     Brief Narrative:  [name] is a 68 y.o. female with past medical history  of tobacco abuse, COPD, underweight, generalized anxiety disorder, essential hypertension presenting with shortness of breath.  Symptoms are progressively worsening over the past week and has been using nebulizer treatments with no relief.  Initially patient found to be hypoxic at 86% on room air, on BiPAP and stabilized currently on nasal cannula.   New events last 24 hours / Subjective: Patient states that she uses 3 L nasal cannula O2 at baseline, decreases to 2 L at bedtime.  Asking for her Percocet and Atarax  which she takes at home for chronic pain and anxiety.  Breathing not back to baseline.  Currently on 4 L nasal cannula O2  Assessment & Plan:   Principal Problem:   COPD with acute exacerbation (HCC) Active Problems:   Chronic hypoxic respiratory failure (HCC)   Essential hypertension   Generalized anxiety disorder   HLD (hyperlipidemia)   Chronic pain   COPD exacerbation - Chronic O2 use of 2-3L at baseline  - Influenza, RSV, COVID-negative - Prednisone , Rocephin , breathing treatment  Hypertension - Enalapril   Hyperlipidemia - Lipitor  Anxiety - BuSpar , Atarax  as needed  Chronic pain - Percocet as needed  Tobacco abuse - Nicotine  patch   DVT prophylaxis:  heparin  injection 5,000 Units Start: 03/27/24 2200 SCDs Start: 03/27/24 1702  Code Status: Full Family Communication: None at bedside Disposition Plan: Home Status is: Observation The patient will require care spanning > 2 midnights and should be moved to inpatient because: breathing not back to baseline     Antimicrobials:  Anti-infectives (From admission, onward)    Start     Dose/Rate Route Frequency Ordered Stop   03/27/24 1800  cefTRIAXone  (ROCEPHIN ) 1 g in sodium chloride  0.9 % 100 mL IVPB        1  g 200 mL/hr over 30 Minutes Intravenous Every 24 hours 03/27/24 1703 04/01/24 1759        Objective: Vitals:   03/28/24 0544 03/28/24 0724 03/28/24 0831 03/28/24 1154  BP: 109/61  (!) 148/83 (!) 146/79  Pulse: 78  90 66  Resp:      Temp: 98.3 F (36.8 C)  99.3 F (37.4 C) 98.4 F (36.9 C)  TempSrc:   Oral Oral  SpO2: 99% 98% 99% 100%  Weight:      Height:        Intake/Output Summary (Last 24 hours) at 03/28/2024 1339 Last data filed at 03/28/2024 0329 Gross per 24 hour  Intake 168.57 ml  Output --  Net 168.57 ml   Filed Weights   03/27/24 1335  Weight: 43.1 kg    Examination: General exam: Appears calm and comfortable  Respiratory system: Diminished breath sounds bilaterally, no conversational dyspnea.  On 4 L oxygen Cardiovascular system: S1 & S2 heard, RRR. No murmurs. No pedal edema. Gastrointestinal system: Abdomen is nondistended, soft and nontender. Normal bowel sounds heard. Central nervous system: Alert and oriented. No focal neurological deficits. Speech clear.  Extremities: Symmetric in appearance  Skin: No rashes, lesions or ulcers on exposed skin  Psychiatry: Judgement and insight appear normal. Mood & affect appropriate.   Data Reviewed: I have personally reviewed following labs and imaging studies  CBC: Recent Labs  Lab 03/27/24 1342 03/28/24 0549  WBC 5.6 3.0*  NEUTROABS 3.9  --  HGB 11.1* 10.6*  HCT 37.0 34.8*  MCV 97.1 96.7  PLT 255 235   Basic Metabolic Panel: Recent Labs  Lab 03/27/24 1342 03/28/24 0549  NA 139 141  K 3.6 4.1  CL 96* 98  CO2 33* 32  GLUCOSE 106* 153*  BUN 7* 9  CREATININE 0.62 0.60  CALCIUM  8.8* 8.9   GFR: Estimated Creatinine Clearance: 44.5 mL/min (by C-G formula based on SCr of 0.6 mg/dL). Liver Function Tests: Recent Labs  Lab 03/27/24 1342  AST 24  ALT 12  ALKPHOS 40  BILITOT 0.7  PROT 6.2*  ALBUMIN 3.7   No results for input(s): LIPASE, AMYLASE in the last 168 hours. No results for  input(s): AMMONIA in the last 168 hours. Coagulation Profile: No results for input(s): INR, PROTIME in the last 168 hours. Cardiac Enzymes: No results for input(s): CKTOTAL, CKMB, CKMBINDEX, TROPONINI in the last 168 hours. BNP (last 3 results) Recent Labs    02/26/24 0031  PROBNP 105.0   HbA1C: No results for input(s): HGBA1C in the last 72 hours. CBG: No results for input(s): GLUCAP in the last 168 hours. Lipid Profile: No results for input(s): CHOL, HDL, LDLCALC, TRIG, CHOLHDL, LDLDIRECT in the last 72 hours. Thyroid  Function Tests: No results for input(s): TSH, T4TOTAL, FREET4, T3FREE, THYROIDAB in the last 72 hours. Anemia Panel: No results for input(s): VITAMINB12, FOLATE, FERRITIN, TIBC, IRON, RETICCTPCT in the last 72 hours. Sepsis Labs: No results for input(s): PROCALCITON, LATICACIDVEN in the last 168 hours.  Recent Results (from the past 240 hours)  Resp panel by RT-PCR (RSV, Flu A&B, Covid) Anterior Nasal Swab     Status: None   Collection Time: 03/27/24  1:42 PM   Specimen: Anterior Nasal Swab  Result Value Ref Range Status   SARS Coronavirus 2 by RT PCR NEGATIVE NEGATIVE Final   Influenza A by PCR NEGATIVE NEGATIVE Final   Influenza B by PCR NEGATIVE NEGATIVE Final    Comment: (NOTE) The Xpert Xpress SARS-CoV-2/FLU/RSV plus assay is intended as an aid in the diagnosis of influenza from Nasopharyngeal swab specimens and should not be used as a sole basis for treatment. Nasal washings and aspirates are unacceptable for Xpert Xpress SARS-CoV-2/FLU/RSV testing.  Fact Sheet for Patients: bloggercourse.com  Fact Sheet for Healthcare Providers: seriousbroker.it  This test is not yet approved or cleared by the [place]  FDA and has been authorized for detection and/or diagnosis of SARS-CoV-2 by FDA under an Emergency Use Authorization (EUA). This EUA will  remain in effect (meaning this test can be used) for the duration of the COVID-19 declaration under Section 564(b)(1) of the Act, 21 U.S.C. section 360bbb-3(b)(1), unless the authorization is terminated or revoked.     Resp Syncytial Virus by PCR NEGATIVE NEGATIVE Final    Comment: (NOTE) Fact Sheet for Patients: bloggercourse.com  Fact Sheet for Healthcare Providers: seriousbroker.it  This test is not yet approved or cleared by the [place]  FDA and has been authorized for detection and/or diagnosis of SARS-CoV-2 by FDA under an Emergency Use Authorization (EUA). This EUA will remain in effect (meaning this test can be used) for the duration of the COVID-19 declaration under Section 564(b)(1) of the Act, 21 U.S.C. section 360bbb-3(b)(1), unless the authorization is terminated or revoked.  Performed at [facility], [address], [place], [place] [address]       Radiology Studies: DG Chest Port 1 View Result Date: 03/27/2024 EXAM: 1 VIEW(S) XRAY OF THE CHEST 03/27/2024 02:35:00 PM COMPARISON:  02/25/2024 CLINICAL HISTORY: SOB FINDINGS: LUNGS AND PLEURA: Emphysema. No focal pulmonary opacity. No pulmonary edema. No pleural effusion. No pneumothorax. HEART AND MEDIASTINUM: Tortuous aorta with aortic atherosclerosis. No acute abnormality of the cardiac and mediastinal silhouettes. BONES AND SOFT TISSUES: Multilevel thoracic osteophytosis. No acute osseous abnormality. IMPRESSION: 1. No acute cardiopulmonary process. 2. Emphysema. Electronically signed by: Norleen Boxer MD 03/27/2024 03:27 PM EDT RP Workstation: [id number]      Scheduled Meds:  atorvastatin   10 mg Oral Daily   busPIRone   7.5 mg Oral BID   heparin  injection (subcutaneous)  5,000 Units Subcutaneous Q12H   ipratropium-albuterol   3 mL Nebulization Q6H   methylPREDNISolone  (SOLU-MEDROL ) injection  60 mg Intravenous Q12H   Followed by   predniSONE   40 mg  Oral Q breakfast   mirtazapine   7.5 mg Oral QHS   nicotine   7 mg Transdermal Q24H   sodium chloride  flush  3 mL Intravenous Q12H   Continuous Infusions:  cefTRIAXone  (ROCEPHIN )  IV Stopped (03/27/24 1907)     LOS: 0 days   Time spent: 25 minutes   Delon Hoe, DO [facility] 03/28/2024, 1:39 PM   Available via Epic secure chat 7am-7pm After these hours, please refer to coverage provider listed on [email or web address]

## 2024-03-29 DIAGNOSIS — J441 Chronic obstructive pulmonary disease with (acute) exacerbation: Secondary | ICD-10-CM | POA: Diagnosis not present

## 2024-03-29 MED ORDER — POLYETHYLENE GLYCOL 3350 17 G PO PACK
17.0000 g | PACK | Freq: Two times a day (BID) | ORAL | Status: DC | PRN
  Administered 2024-03-29 – 2024-03-30 (×2): 17 g via ORAL
  Filled 2024-03-29 (×2): qty 1

## 2024-03-29 MED ORDER — CARMEX CLASSIC LIP BALM EX OINT
1.0000 | TOPICAL_OINTMENT | CUTANEOUS | Status: DC | PRN
  Administered 2024-03-29: 1 via TOPICAL
  Filled 2024-03-29: qty 10

## 2024-03-29 NOTE — Progress Notes (Signed)
 [facility] night cross cover note:   I was notified by the patient's RN  of the pt's request for miralax  for constipation, noting that she takes this medication at home. I subsequently added prn MiraLAX  for her.      Eva Pore, DO Hospitalist

## 2024-03-29 NOTE — Progress Notes (Signed)
 PROGRESS NOTE    [name]  FMW:[id number] DOB: 20-Dec-1955 DOA: 03/27/2024 PCP: Maree Leni Edyth DELENA, MD     Brief Narrative:  [name] is a 68 y.o. female with past medical history  of tobacco abuse, COPD, underweight, generalized anxiety disorder, essential hypertension presenting with shortness of breath.  Symptoms are progressively worsening over the past week and has been using nebulizer treatments with no relief.  Initially patient found to be hypoxic at 86% on room air, on BiPAP and stabilized currently on nasal cannula. Patient states that she uses 3 L nasal cannula O2 at baseline, decreases to 2 L at bedtime.    New events last 24 hours / Subjective: Upset because she called front desk to get breathing treatment this morning but did not get timely response. Currently, respiratory therapist is at bedside giving neb.   Assessment & Plan:   Principal Problem:   COPD with acute exacerbation (HCC) Active Problems:   Chronic hypoxic respiratory failure (HCC)   Essential hypertension   Generalized anxiety disorder   HLD (hyperlipidemia)   Chronic pain   COPD exacerbation - Chronic O2 use of 2-3L at baseline  - Influenza, RSV, COVID-negative - Prednisone , Rocephin , breathing treatment  Hypertension - Enalapril   Hyperlipidemia - Lipitor  Anxiety - BuSpar , Atarax  as needed  Chronic pain - Percocet as needed  Tobacco abuse - Nicotine  patch   DVT prophylaxis:  heparin  injection 5,000 Units Start: 03/27/24 2200 SCDs Start: 03/27/24 1702  Code Status: Full Family Communication: None at bedside Disposition Plan: Home Status is: Observation The patient will require care spanning > 2 midnights and should be moved to inpatient because: breathing not back to baseline, more wheezing today     Antimicrobials:  Anti-infectives (From admission, onward)    Start     Dose/Rate Route Frequency Ordered Stop   03/27/24 1800  cefTRIAXone  (ROCEPHIN ) 1 g in sodium  chloride 0.9 % 100 mL IVPB        1 g 200 mL/hr over 30 Minutes Intravenous Every 24 hours 03/27/24 1703 04/01/24 1759        Objective: Vitals:   03/29/24 0151 03/29/24 0411 03/29/24 0740 03/29/24 0907  BP:  (!) 91/54 116/72   Pulse: 88 68 (!) 55 91  Resp: 16 20 18 20   Temp:  98.6 F (37 C) 98.5 F (36.9 C)   TempSrc:  Oral Oral   SpO2: 98% 100% 100% 98%  Weight:      Height:        Intake/Output Summary (Last 24 hours) at 03/29/2024 0949 Last data filed at 03/29/2024 0114 Gross per 24 hour  Intake 480 ml  Output --  Net 480 ml   Filed Weights   03/27/24 1335  Weight: 43.1 kg    Examination: General exam: Appears calm and comfortable  Respiratory system: +Diffuse wheezes, no conversational dyspnea, on  O2  Cardiovascular system: S1 & S2 heard, RRR. No murmurs. No pedal edema. Gastrointestinal system: Abdomen is nondistended, soft and nontender. Normal bowel sounds heard. Central nervous system: Alert and oriented. No focal neurological deficits. Speech clear.  Extremities: Symmetric in appearance  Skin: No rashes, lesions or ulcers on exposed skin  Psychiatry: Judgement and insight appear normal. Mood & affect appropriate.   Data Reviewed: I have personally reviewed following labs and imaging studies  CBC: Recent Labs  Lab 03/27/24 1342 03/28/24 0549  WBC 5.6 3.0*  NEUTROABS 3.9  --   HGB 11.1* 10.6*  HCT 37.0  34.8*  MCV 97.1 96.7  PLT 255 235   Basic Metabolic Panel: Recent Labs  Lab 03/27/24 1342 03/28/24 0549  NA 139 141  K 3.6 4.1  CL 96* 98  CO2 33* 32  GLUCOSE 106* 153*  BUN 7* 9  CREATININE 0.62 0.60  CALCIUM  8.8* 8.9   GFR: Estimated Creatinine Clearance: 44.5 mL/min (by C-G formula based on SCr of 0.6 mg/dL). Liver Function Tests: Recent Labs  Lab 03/27/24 1342  AST 24  ALT 12  ALKPHOS 40  BILITOT 0.7  PROT 6.2*  ALBUMIN 3.7   No results for input(s): LIPASE, AMYLASE in the last 168 hours. No results for input(s):  AMMONIA in the last 168 hours. Coagulation Profile: No results for input(s): INR, PROTIME in the last 168 hours. Cardiac Enzymes: No results for input(s): CKTOTAL, CKMB, CKMBINDEX, TROPONINI in the last 168 hours. BNP (last 3 results) Recent Labs    02/26/24 0031  PROBNP 105.0   HbA1C: No results for input(s): HGBA1C in the last 72 hours. CBG: No results for input(s): GLUCAP in the last 168 hours. Lipid Profile: No results for input(s): CHOL, HDL, LDLCALC, TRIG, CHOLHDL, LDLDIRECT in the last 72 hours. Thyroid  Function Tests: No results for input(s): TSH, T4TOTAL, FREET4, T3FREE, THYROIDAB in the last 72 hours. Anemia Panel: No results for input(s): VITAMINB12, FOLATE, FERRITIN, TIBC, IRON, RETICCTPCT in the last 72 hours. Sepsis Labs: No results for input(s): PROCALCITON, LATICACIDVEN in the last 168 hours.  Recent Results (from the past 240 hours)  Resp panel by RT-PCR (RSV, Flu A&B, Covid) Anterior Nasal Swab     Status: None   Collection Time: 03/27/24  1:42 PM   Specimen: Anterior Nasal Swab  Result Value Ref Range Status   SARS Coronavirus 2 by RT PCR NEGATIVE NEGATIVE Final   Influenza A by PCR NEGATIVE NEGATIVE Final   Influenza B by PCR NEGATIVE NEGATIVE Final    Comment: (NOTE) The Xpert Xpress SARS-CoV-2/FLU/RSV plus assay is intended as an aid in the diagnosis of influenza from Nasopharyngeal swab specimens and should not be used as a sole basis for treatment. Nasal washings and aspirates are unacceptable for Xpert Xpress SARS-CoV-2/FLU/RSV testing.  Fact Sheet for Patients: bloggercourse.com  Fact Sheet for Healthcare Providers: seriousbroker.it  This test is not yet approved or cleared by the [place]  FDA and has been authorized for detection and/or diagnosis of SARS-CoV-2 by FDA under an Emergency Use Authorization (EUA). This EUA will remain in  effect (meaning this test can be used) for the duration of the COVID-19 declaration under Section 564(b)(1) of the Act, 21 U.S.C. section 360bbb-3(b)(1), unless the authorization is terminated or revoked.     Resp Syncytial Virus by PCR NEGATIVE NEGATIVE Final    Comment: (NOTE) Fact Sheet for Patients: bloggercourse.com  Fact Sheet for Healthcare Providers: seriousbroker.it  This test is not yet approved or cleared by the [place]  FDA and has been authorized for detection and/or diagnosis of SARS-CoV-2 by FDA under an Emergency Use Authorization (EUA). This EUA will remain in effect (meaning this test can be used) for the duration of the COVID-19 declaration under Section 564(b)(1) of the Act, 21 U.S.C. section 360bbb-3(b)(1), unless the authorization is terminated or revoked.  Performed at [facility], [address] [place], [place] [address]       Radiology Studies: DG Chest Port 1 View Result Date: 03/27/2024 EXAM: 1 VIEW(S) XRAY OF THE CHEST 03/27/2024 02:35:00 PM COMPARISON: 02/25/2024 CLINICAL HISTORY: SOB FINDINGS: LUNGS  AND PLEURA: Emphysema. No focal pulmonary opacity. No pulmonary edema. No pleural effusion. No pneumothorax. HEART AND MEDIASTINUM: Tortuous aorta with aortic atherosclerosis. No acute abnormality of the cardiac and mediastinal silhouettes. BONES AND SOFT TISSUES: Multilevel thoracic osteophytosis. No acute osseous abnormality. IMPRESSION: 1. No acute cardiopulmonary process. 2. Emphysema. Electronically signed by: Norleen Boxer MD 03/27/2024 03:27 PM EDT RP Workstation: [id number]      Scheduled Meds:  atorvastatin   10 mg Oral Daily   busPIRone   7.5 mg Oral BID   enalapril   10 mg Oral Daily   heparin  injection (subcutaneous)  5,000 Units Subcutaneous Q12H   ipratropium-albuterol   3 mL Nebulization Q6H   mirtazapine   7.5 mg Oral QHS   nicotine   7 mg Transdermal Q24H   predniSONE   40 mg  Oral Q breakfast   sodium chloride  flush  3 mL Intravenous Q12H   Continuous Infusions:  cefTRIAXone  (ROCEPHIN )  IV 1 g (03/28/24 1722)     LOS: 0 days   Time spent: 25 minutes   Delon Hoe, DO [facility] 03/29/2024, 9:49 AM   Available via Epic secure chat 7am-7pm After these hours, please refer to coverage provider listed on [email or web address]

## 2024-03-29 NOTE — Evaluation (Signed)
 Occupational Therapy Evaluation Patient Details Name: [name] MRN: [id number] DOB: 01/04/1956 Today's Date: 03/29/2024   History of Present Illness   Pt is 68 year old presented to [facility] on  03/27/24 for SOB. Pt with acute respiratory failure with hypoxia due to copd exacerbation. Initially placed on bipap. PMH - copd, htn, anxiety, lt THR, back pain     Clinical Impressions Pt admitted based on above, and was seen based on problem list below. PTA pt was living alone and reports mod I with ADLs, but having increased difficulty completing IADLs. Today pt is at supervision for ADLs.  Used IV pole for hallway mobility ~136ft at supervision level.  Pt limited by decreased activity tolerance, reporting 3/4 DOE. Pt would benefit from use of rollator at d/c to promote energy conservation. Pt requesting HHOT services and HH aid (if insurance will cover) to rebuild activity tolerance and assist with IADLs. OT will continue to follow acutely to maximize functional independence.     If plan is discharge home, recommend the following:   A little help with walking and/or transfers;A little help with bathing/dressing/bathroom;Assistance with cooking/housework     Functional Status Assessment   Patient has had a recent decline in their functional status and demonstrates the ability to make significant improvements in function in a reasonable and predictable amount of time.     Equipment Recommendations   Other (comment) Aeronautical Engineer)      Precautions/Restrictions   Precautions Precautions: None Restrictions Weight Bearing Restrictions Per Provider Order: No     Mobility Bed Mobility Overal bed mobility: Modified Independent             General bed mobility comments: HOB elevated, use of rails    Transfers Overall transfer level: Needs assistance Equipment used:  (IV pole) Transfers: Sit to/from Stand Sit to Stand: Supervision           General transfer comment: Pt  use of IV pole to ambulate in hallway, benefits from BUE support      Balance Overall balance assessment: Mild deficits observed, not formally tested       ADL either performed or assessed with clinical judgement   ADL Overall ADL's : Needs assistance/impaired Eating/Feeding: Set up;Sitting   Grooming: Wash/dry hands;Supervision/safety;Standing           Upper Body Dressing : Set up;Sitting   Lower Body Dressing: Supervision/safety;Sit to/from stand   Toilet Transfer: Supervision/safety;Ambulation;BSC/3in1 Toilet Transfer Details (indicate cue type and reason): Use of IV pole Toileting- Clothing Manipulation and Hygiene: Supervision/safety;Sit to/from stand       Functional mobility during ADLs: Supervision/safety General ADL Comments: Decreased activity tolerance     Vision Baseline Vision/History: 0 No visual deficits Patient Visual Report: No change from baseline Vision Assessment?: No apparent visual deficits            Pertinent Vitals/Pain Pain Assessment Pain Assessment: No/denies pain     Extremity/Trunk Assessment Upper Extremity Assessment Upper Extremity Assessment: Generalized weakness   Lower Extremity Assessment Lower Extremity Assessment: Defer to PT evaluation       Communication Communication Communication: No apparent difficulties   Cognition Arousal: Alert Behavior During Therapy: WFL for tasks assessed/performed Cognition: No apparent impairments     Following commands: Intact       Cueing  General Comments   Cueing Techniques: Verbal cues  Sustaining O2 >93% on 2L           Home Living Family/patient expects to be discharged to:: Private residence  Living Arrangements: Alone Available Help at Discharge: Family;Available PRN/intermittently Type of Home: Apartment Home Access: Level entry     Home Layout: One level     Bathroom Shower/Tub: Chief Strategy Officer: Standard     Home Equipment: Cane  - single point   Additional Comments: Baseline 2L      Prior Functioning/Environment Prior Level of Function : Independent/Modified Independent             Mobility Comments: PRN use of spc, mostly no AD ADLs Comments: Ind    OT Problem List: Decreased activity tolerance   OT Treatment/Interventions: Self-care/ADL training;Therapeutic exercise;Energy conservation;DME and/or AE instruction;Therapeutic activities;Patient/family education;Balance training      OT Goals(Current goals can be found in the care plan section)   Acute Rehab OT Goals Patient Stated Goal: To get HH aid OT Goal Formulation: With patient Time For Goal Achievement: 04/12/24 Potential to Achieve Goals: Good   OT Frequency:  Min 2X/week       AM-PAC OT 6 Clicks Daily Activity     Outcome Measure Help from another person eating meals?: None Help from another person taking care of personal grooming?: A Little Help from another person toileting, which includes using toliet, bedpan, or urinal?: A Little Help from another person bathing (including washing, rinsing, drying)?: A Little Help from another person to put on and taking off regular upper body clothing?: A Little Help from another person to put on and taking off regular lower body clothing?: A Little 6 Click Score: 19   End of Session Equipment Utilized During Treatment: Oxygen Nurse Communication: Mobility status  Activity Tolerance: Patient tolerated treatment well Patient left: in bed;with call bell/phone within reach  OT Visit Diagnosis: Muscle weakness (generalized) (M62.81)                Time: 8571-8553 OT Time Calculation (min): 18 min Charges:  OT General Charges $OT Visit: 1 Visit OT Evaluation $OT Eval Moderate Complexity: 1 Mod  Janyra Barillas C, OT  Acute Rehabilitation Services Office [phone] Secure chat preferred   Adrianne GORMAN Savers 03/29/2024, 4:12 PM

## 2024-03-30 ENCOUNTER — Other Ambulatory Visit (HOSPITAL_COMMUNITY): Payer: Self-pay

## 2024-03-30 ENCOUNTER — Ambulatory Visit: Admitting: Dermatology

## 2024-03-30 DIAGNOSIS — J441 Chronic obstructive pulmonary disease with (acute) exacerbation: Secondary | ICD-10-CM | POA: Diagnosis not present

## 2024-03-30 MED ORDER — DOXYCYCLINE HYCLATE 100 MG PO TABS
100.0000 mg | ORAL_TABLET | Freq: Two times a day (BID) | ORAL | 0 refills | Status: AC
  Filled 2024-03-30: qty 8, 4d supply, fill #0

## 2024-03-30 MED ORDER — BENZONATATE 100 MG PO CAPS
100.0000 mg | ORAL_CAPSULE | Freq: Three times a day (TID) | ORAL | 0 refills | Status: DC | PRN
  Filled 2024-03-30: qty 20, 7d supply, fill #0

## 2024-03-30 MED ORDER — PREDNISONE 20 MG PO TABS
40.0000 mg | ORAL_TABLET | Freq: Every day | ORAL | 0 refills | Status: AC
Start: 2024-03-30 — End: 2024-04-04
  Filled 2024-03-30: qty 10, 5d supply, fill #0

## 2024-03-30 NOTE — Progress Notes (Signed)

## 2024-03-30 NOTE — Progress Notes (Signed)
 [facility] night cross cover note:   Pt conveys a history of obstructive sleep apnea for which she uses nocturnal CPAP at home.  I subsequently placed order for RT consult for resumption of home nocturnal CPAP for OSA.     Eva Pore, DO Hospitalist

## 2024-03-30 NOTE — Discharge Summary (Signed)
 Physician Discharge Summary  [name] FMW:[id number] DOB: 1956/05/04 DOA: 03/27/2024  PCP: Maree Leni Edyth DELENA, MD  Admit date: 03/27/2024 Discharge date: 03/30/2024  Admitted From: Home Disposition: Home with home health  Recommendations for Outpatient Follow-up:  Follow up with PCP in 1-2 weeks Please obtain BMP/CBC in one week   Home Health: PT/OT/RN Equipment/Devices: Vannie with seat  Discharge Condition: Stable CODE STATUS: Full code Diet recommendation: Regular diet, nutritional supplements  Discharge summary: 68 year old with history of COPD, chronic hypoxemia on 2 to 4 L oxygen at home, CPAP at night, generalized anxiety disorder, hypertension presented with about 1 week of shortness of breath and wheezing not relieved by nebulizer treatment at home.  Admitted with COPD exacerbation.  She was treated with steroids, steroid inhalers and bronchodilator therapy, Rocephin  with clinical improvement.  Able to go home today with outpatient follow-up.  COPD with acute exacerbation: Chronic hypoxemic respiratory failure. Adequately stabilized. Negative for infection workup.  Negative for influenza, RSV and COVID. Received Rocephin  day 3, will continue doxycycline  for another 4 days. Prednisone  for another 4 days. Resume all home medications including enalapril , diuretics, potassium supplementation, BuSpar  and Atarax  as needed.  Patient will benefit with ongoing therapies, will prescribe home health physical therapy, Occupational Therapy and nursing supervision.  She will also benefit with walker.  Patient was in need for a CPAP mask, recommended her to contact supplier or pulmonary office for orders.   Discharge Diagnoses:  Principal Problem:   COPD with acute exacerbation (HCC) Active Problems:   Chronic hypoxic respiratory failure (HCC)   Essential hypertension   Generalized anxiety disorder   HLD (hyperlipidemia)   Chronic pain    Discharge  Instructions  Discharge Instructions     Diet general   Complete by: As directed    Increase activity slowly   Complete by: As directed       Allergies as of 03/30/2024   No Known Allergies      Medication List     STOP taking these medications    nicotine  7 mg/24hr patch Commonly known as: NICODERM CQ  - dosed in mg/24 hr   oxyCODONE -acetaminophen  10-325 MG tablet Commonly known as: PERCOCET   zolpidem  5 MG tablet Commonly known as: AMBIEN        TAKE these medications    albuterol  (2.5 MG/3ML) 0.083% nebulizer solution Commonly known as: PROVENTIL  Take 2.5 mg by nebulization every 6 (six) hours as needed for wheezing or shortness of breath.   albuterol  108 (90 Base) MCG/ACT inhaler Commonly known as: VENTOLIN  HFA Inhale 2 puffs into the lungs every 4 (four) hours as needed for wheezing or shortness of breath.   atorvastatin  10 MG tablet Commonly known as: LIPITOR Take 10 mg by mouth at bedtime.   benzonatate  100 MG capsule Commonly known as: TESSALON  Take 1 capsule (100 mg total) by mouth 3 (three) times daily as needed for cough.   busPIRone  7.5 MG tablet Commonly known as: BUSPAR  Take 7.5 mg by mouth 2 (two) times daily.   doxycycline  100 MG tablet Commonly known as: VIBRA -TABS Take 1 tablet (100 mg total) by mouth 2 (two) times daily for 4 days.   enalapril  10 MG tablet Commonly known as: VASOTEC  Take 1 tablet (10 mg total) by mouth daily.   famotidine  40 MG tablet Commonly known as: PEPCID  Take 40 mg by mouth daily as needed for heartburn or indigestion.   fluticasone -salmeterol 250-50 MCG/ACT Aepb Commonly known as: Advair  Diskus Inhale 1 puff into the lungs  in the morning and at bedtime.   furosemide 20 MG tablet Commonly known as: LASIX Take 20 mg by mouth daily as needed.   hydrOXYzine  25 MG tablet Commonly known as: ATARAX  Take 25 mg by mouth 3 (three) times daily as needed for anxiety.   ipratropium-albuterol  0.5-2.5 (3) MG/3ML  Soln Commonly known as: DUONEB Take 3 mLs by nebulization every 6 (six) hours as needed. What changed: when to take this   mirtazapine  7.5 MG tablet Commonly known as: REMERON  Take 7.5 mg by mouth at bedtime.   multivitamin capsule Take 1 capsule by mouth daily with breakfast.   OXYGEN Inhale 3 L/min into the lungs as needed (for shortness of breath).   polyethylene glycol 17 g packet Commonly known as: MIRALAX  / GLYCOLAX  Take 17 g by mouth daily as needed for mild constipation.   potassium chloride  SA 20 MEQ tablet Commonly known as: KLOR-CON  M Take 1 tablet (20 mEq total) by mouth daily.   predniSONE  20 MG tablet Commonly known as: DELTASONE  Take 2 tablets (40 mg total) by mouth daily for 5 days. What changed: medication strength   PRESCRIPTION MEDICATION See admin instructions. CPAP- At bedtime               Durable Medical Equipment  (From admission, onward)           Start     Ordered   03/30/24 0850  For home use only DME 4 wheeled rolling walker with seat  Once       Question:  Patient needs a walker to treat with the following condition  Answer:  COPD (chronic obstructive pulmonary disease) (HCC)   03/30/24 0849            No Known Allergies  Consultations: None   Procedures/Studies: CT SOFT TISSUE NECK WO CONTRAST Result Date: 03/28/2024 EXAM: CT NECK WITHOUT CONTRAST 03/23/2024 01:44:42 PM TECHNIQUE: CT of the neck was performed without the administration of intravenous contrast. Multiplanar reformatted images are provided for review. Automated exposure control, iterative reconstruction, and/or weight based adjustment of the mA/kV was utilized to reduce the radiation dose to as low as reasonably achievable. The study is limited by the absence of intravenous contrast. COMPARISON: None available. CLINICAL HISTORY: Nodule on right side of neck. FINDINGS: AERODIGESTIVE TRACT: No discrete mass. No edema. There are no lesions evident within the  nasal cavity, oral cavity, pharynx or larynx. SALIVARY GLANDS: The parotid and submandibular glands are unremarkable. THYROID : Unremarkable. LYMPH NODES: No suspicious cervical lymphadenopathy. SOFT TISSUES: There is a small subcutaneous nodule projecting laterally from the right neck at the level of C2-C3, measuring approximately 9 mm at its base. It demonstrates central Hounsfield units of approximately 16. It is seen on image 70 of coronal series 6. BRAIN, ORBITS, SINUSES AND MASTOIDS: No acute abnormality. LUNGS AND MEDIASTINUM: There is moderate-to-severe central lobular emphysema. BONES: No focal bone abnormality. VASCULATURE: There is mild-to-moderate calcification of the carotid bulbs, worse on the left. IMPRESSION: 1. Small 9 mm subcutaneous nodule in the right lateral neck at C2-3 with central attenuation ~16 HU; no lymphadenopathy. 2. Mild-to-moderate carotid bulb calcifications, left greater than right. 3. Moderate-to-severe centrilobular emphysema. For patients aged 70, consider evaluation for eligibility for a low-dose CT lung cancer screening program, as emphysema is an independent risk factor. Electronically signed by: Evalene Coho MD 03/28/2024 07:34 AM EDT RP Workstation: [id number]   DG Chest Port 1 View Result Date: 03/27/2024 EXAM: 1 VIEW(S) XRAY OF THE CHEST 03/27/2024 02:35:00 PM  COMPARISON: 02/25/2024 CLINICAL HISTORY: SOB FINDINGS: LUNGS AND PLEURA: Emphysema. No focal pulmonary opacity. No pulmonary edema. No pleural effusion. No pneumothorax. HEART AND MEDIASTINUM: Tortuous aorta with aortic atherosclerosis. No acute abnormality of the cardiac and mediastinal silhouettes. BONES AND SOFT TISSUES: Multilevel thoracic osteophytosis. No acute osseous abnormality. IMPRESSION: 1. No acute cardiopulmonary process. 2. Emphysema. Electronically signed by: Norleen Boxer MD 03/27/2024 03:27 PM EDT RP Workstation: [id number]   (Echo, Carotid, EGD, Colonoscopy, ERCP)    Subjective:  Patient seen and examined.  Denies any complaints.  Wheezing and cough has improved.  She is walking around in the room with 2 L of oxygen.  Eager to go home.  She does not smoke anymore.   Discharge Exam: Vitals:   03/30/24 0329 03/30/24 0810  BP: (!) 102/59 117/76  Pulse: (!) 58 64  Resp: 20   Temp: 98.2 F (36.8 C)   SpO2: 100% 100%   Vitals:   03/29/24 2022 03/30/24 0143 03/30/24 0329 03/30/24 0810  BP:   (!) 102/59 117/76  Pulse:   (!) 58 64  Resp:   20   Temp:   98.2 F (36.8 C)   TempSrc:   Oral   SpO2: 98% 96% 100% 100%  Weight:      Height:        General: Pt is alert, awake, not in acute distress, able to talk in complete sentences.  On 2 L oxygen. Cardiovascular: RRR, S1/S2 +, no rubs, no gallops Respiratory: CTA bilaterally, no wheezing, no rhonchi, no added sounds. Abdominal: Soft, NT, ND, bowel sounds + Extremities: no edema, no cyanosis    The results of significant diagnostics from this hospitalization (including imaging, microbiology, ancillary and laboratory) are listed below for reference.     Microbiology: Recent Results (from the past 240 hours)  Resp panel by RT-PCR (RSV, Flu A&B, Covid) Anterior Nasal Swab     Status: None   Collection Time: 03/27/24  1:42 PM   Specimen: Anterior Nasal Swab  Result Value Ref Range Status   SARS Coronavirus 2 by RT PCR NEGATIVE NEGATIVE Final   Influenza A by PCR NEGATIVE NEGATIVE Final   Influenza B by PCR NEGATIVE NEGATIVE Final    Comment: (NOTE) The Xpert Xpress SARS-CoV-2/FLU/RSV plus assay is intended as an aid in the diagnosis of influenza from Nasopharyngeal swab specimens and should not be used as a sole basis for treatment. Nasal washings and aspirates are unacceptable for Xpert Xpress SARS-CoV-2/FLU/RSV testing.  Fact Sheet for Patients: bloggercourse.com  Fact Sheet for Healthcare Providers: seriousbroker.it  This test is not yet approved or  cleared by the [place]  FDA and has been authorized for detection and/or diagnosis of SARS-CoV-2 by FDA under an Emergency Use Authorization (EUA). This EUA will remain in effect (meaning this test can be used) for the duration of the COVID-19 declaration under Section 564(b)(1) of the Act, 21 U.S.C. section 360bbb-3(b)(1), unless the authorization is terminated or revoked.     Resp Syncytial Virus by PCR NEGATIVE NEGATIVE Final    Comment: (NOTE) Fact Sheet for Patients: bloggercourse.com  Fact Sheet for Healthcare Providers: seriousbroker.it  This test is not yet approved or cleared by the [place]  FDA and has been authorized for detection and/or diagnosis of SARS-CoV-2 by FDA under an Emergency Use Authorization (EUA). This EUA will remain in effect (meaning this test can be used) for the duration of the COVID-19 declaration under Section 564(b)(1) of the Act, 21 U.S.C. section 360bbb-3(b)(1), unless the authorization is  terminated or revoked.  Performed at [facility], [address], [place], [place] [address]      Labs: BNP (last 3 results) Recent Labs    10/11/23 0638 12/02/23 2209 03/27/24 1342  BNP 22.7 32.9 61.6   Basic Metabolic Panel: Recent Labs  Lab 03/27/24 1342 03/28/24 0549  NA 139 141  K 3.6 4.1  CL 96* 98  CO2 33* 32  GLUCOSE 106* 153*  BUN 7* 9  CREATININE 0.62 0.60  CALCIUM  8.8* 8.9   Liver Function Tests: Recent Labs  Lab 03/27/24 1342  AST 24  ALT 12  ALKPHOS 40  BILITOT 0.7  PROT 6.2*  ALBUMIN 3.7   No results for input(s): LIPASE, AMYLASE in the last 168 hours. No results for input(s): AMMONIA in the last 168 hours. CBC: Recent Labs  Lab 03/27/24 1342 03/28/24 0549  WBC 5.6 3.0*  NEUTROABS 3.9  --   HGB 11.1* 10.6*  HCT 37.0 34.8*  MCV 97.1 96.7  PLT 255 235   Cardiac Enzymes: No results for input(s): CKTOTAL, CKMB, CKMBINDEX,  TROPONINI in the last 168 hours. BNP: Invalid input(s): POCBNP CBG: No results for input(s): GLUCAP in the last 168 hours. D-Dimer No results for input(s): DDIMER in the last 72 hours. Hgb A1c No results for input(s): HGBA1C in the last 72 hours. Lipid Profile No results for input(s): CHOL, HDL, LDLCALC, TRIG, CHOLHDL, LDLDIRECT in the last 72 hours. Thyroid  function studies No results for input(s): TSH, T4TOTAL, T3FREE, THYROIDAB in the last 72 hours.  Invalid input(s): FREET3 Anemia work up No results for input(s): VITAMINB12, FOLATE, FERRITIN, TIBC, IRON, RETICCTPCT in the last 72 hours. Urinalysis    Component Value Date/Time   COLORURINE YELLOW 01/21/2024 1557   APPEARANCEUR HAZY (A) 01/21/2024 1557   LABSPEC 1.015 01/21/2024 1557   PHURINE 5.0 01/21/2024 1557   GLUCOSEU NEGATIVE 01/21/2024 1557   HGBUR NEGATIVE 01/21/2024 1557   BILIRUBINUR NEGATIVE 01/21/2024 1557   KETONESUR NEGATIVE 01/21/2024 1557   PROTEINUR NEGATIVE 01/21/2024 1557   UROBILINOGEN 0.2 10/29/2013 1114   NITRITE NEGATIVE 01/21/2024 1557   LEUKOCYTESUR NEGATIVE 01/21/2024 1557   Sepsis Labs Recent Labs  Lab 03/27/24 1342 03/28/24 0549  WBC 5.6 3.0*   Microbiology Recent Results (from the past 240 hours)  Resp panel by RT-PCR (RSV, Flu A&B, Covid) Anterior Nasal Swab     Status: None   Collection Time: 03/27/24  1:42 PM   Specimen: Anterior Nasal Swab  Result Value Ref Range Status   SARS Coronavirus 2 by RT PCR NEGATIVE NEGATIVE Final   Influenza A by PCR NEGATIVE NEGATIVE Final   Influenza B by PCR NEGATIVE NEGATIVE Final    Comment: (NOTE) The Xpert Xpress SARS-CoV-2/FLU/RSV plus assay is intended as an aid in the diagnosis of influenza from Nasopharyngeal swab specimens and should not be used as a sole basis for treatment. Nasal washings and aspirates are unacceptable for Xpert Xpress SARS-CoV-2/FLU/RSV testing.  Fact Sheet for  Patients: bloggercourse.com  Fact Sheet for Healthcare Providers: seriousbroker.it  This test is not yet approved or cleared by the [place]  FDA and has been authorized for detection and/or diagnosis of SARS-CoV-2 by FDA under an Emergency Use Authorization (EUA). This EUA will remain in effect (meaning this test can be used) for the duration of the COVID-19 declaration under Section 564(b)(1) of the Act, 21 U.S.C. section 360bbb-3(b)(1), unless the authorization is terminated or revoked.     Resp Syncytial Virus by PCR NEGATIVE NEGATIVE Final  Comment: (NOTE) Fact Sheet for Patients: bloggercourse.com  Fact Sheet for Healthcare Providers: seriousbroker.it  This test is not yet approved or cleared by the [place]  FDA and has been authorized for detection and/or diagnosis of SARS-CoV-2 by FDA under an Emergency Use Authorization (EUA). This EUA will remain in effect (meaning this test can be used) for the duration of the COVID-19 declaration under Section 564(b)(1) of the Act, 21 U.S.C. section 360bbb-3(b)(1), unless the authorization is terminated or revoked.  Performed at [facility], [address] [place], [place] [address]      Time coordinating discharge: 35 minutes  SIGNED:   Renato Applebaum, MD  [facility] 03/30/2024, 8:55 AM

## 2024-03-30 NOTE — Progress Notes (Signed)
 Transition of Care [facility]) - Inpatient Brief Assessment   Patient Details  Name: CHRISTIANA GUREVICH MRN: [id number] Date of Birth: April 27, 1956  [facility] of [facility]) CM/SW Contact:    Rosaline JONELLE Joe, RN Phone Number: 03/30/2024, 9:43 AM   Clinical Narrative: CM met with the patient at the bedside to discuss Medicare choice regarding DMe company and patient prefers [employer].  I called and ordered a portable oxygen tank and rolator for home.  DME orders placed - to be co-signed by MD.  Patient preferred [facility] - I called Jon Rogue, RNCM with [facility] and she accepted for services.  HH orders placed to be co-signed by MD.  Patient will need taxi voucher for home - to be provided by discharge lounge.   Transition of Care Asessment: Insurance and Status: Insurance coverage has been reviewed Patient has primary care physician: Yes Home environment has been reviewed: from home alone Prior level of function:: self Prior/Current Home Services: (P) No current home services Social Drivers of Health Review: (P) SDOH reviewed interventions complete Readmission risk has been reviewed: (P) Yes Transition of care needs: (P) transition of care needs identified, TOC will continue to follow

## 2024-03-30 NOTE — Progress Notes (Signed)
 Patient requested CPAP for the night, on-call hospitalist ordered one see orders. Respiratory therapist notified about CPAP being ordered. CPAP was never brought up to unit to be put on the patient. Called RT multiple times no response. Patient currently on 3LNC.

## 2024-03-30 NOTE — Progress Notes (Signed)
    Durable Medical Equipment  (From admission, onward)           Start     Ordered   03/30/24 0918  For home use only DME Other see comment  Once       Comments: Please provide portable oxygen tank for home at 3L/min   Question:  Length of Need  Answer:  6 Months   03/30/24 0917   03/30/24 0850  For home use only DME 4 wheeled rolling walker with seat  Once       Question:  Patient needs a walker to treat with the following condition  Answer:  COPD (chronic obstructive pulmonary disease) (HCC)   03/30/24 9150

## 2024-03-30 NOTE — Progress Notes (Signed)
 This nurse was giving patient nightly medications. This nurse asked the patient would she like for her medications to be called out to her since she was on the phone, the patient stated yes. After calling out each medication that was scheduled plus her PRN oxycodone  and percocet, patient was handed her medications in a med cup and patient got upset because this nurse could not tell her which medication was which and stated that the medications look alike. This nurse explained that the medications were called out to her and due to packaging some medications may resemble but she was getting everything that was called out to her. The patient became argumentative and stated that this is not what other nurses have been giving her and she feels like each nurse brings her something different each time. This nurse continue to educate the patient that she is receiving all her medications (See MAR) and medications are scanned into the system to keep track.  The patient requested miralax  to help with a bowel movement, this nurse notified on-call. See orders.  No other concerns at this time.

## 2024-04-01 ENCOUNTER — Telehealth: Payer: Self-pay | Admitting: Pulmonary Disease

## 2024-04-01 NOTE — Telephone Encounter (Signed)
 Patient was recently in the hospital so a hospital follow up appointment was made. She states that she is in need of a bipap machine as well. Please contact patient about an order for a bipap. She can be reached at [phone].

## 2024-04-02 NOTE — Telephone Encounter (Addendum)
 Patient will need new Rx along with notes from up coming OV  Patient Signed AMA with RT with [employer] and request from them to come get machine in July. Patient stated she won't use it     Noted for the appointment. I have to reach out to [employer] from my notes looks like they were trying to get a hold of her to sign hardship paperwork.  SABRA Maine out to Mitch to see if she is able to get the Bipap.

## 2024-04-06 ENCOUNTER — Other Ambulatory Visit: Payer: Self-pay

## 2024-04-06 DIAGNOSIS — J9612 Chronic respiratory failure with hypercapnia: Secondary | ICD-10-CM

## 2024-04-06 DIAGNOSIS — J432 Centrilobular emphysema: Secondary | ICD-10-CM

## 2024-04-07 ENCOUNTER — Emergency Department (HOSPITAL_COMMUNITY)

## 2024-04-07 ENCOUNTER — Inpatient Hospital Stay (HOSPITAL_COMMUNITY)
Admission: EM | Admit: 2024-04-07 | Discharge: 2024-04-12 | DRG: 189 | Disposition: A | Discharge status: Home Health | Attending: Emergency Medicine | Admitting: Emergency Medicine

## 2024-04-07 ENCOUNTER — Other Ambulatory Visit: Payer: Self-pay

## 2024-04-07 ENCOUNTER — Encounter (HOSPITAL_COMMUNITY): Payer: Self-pay

## 2024-04-07 DIAGNOSIS — F1120 Opioid dependence, uncomplicated: Secondary | ICD-10-CM | POA: Diagnosis present

## 2024-04-07 DIAGNOSIS — E785 Hyperlipidemia, unspecified: Secondary | ICD-10-CM | POA: Diagnosis present

## 2024-04-07 DIAGNOSIS — Z801 Family history of malignant neoplasm of trachea, bronchus and lung: Secondary | ICD-10-CM

## 2024-04-07 DIAGNOSIS — F32A Depression, unspecified: Secondary | ICD-10-CM | POA: Diagnosis present

## 2024-04-07 DIAGNOSIS — I10 Essential (primary) hypertension: Secondary | ICD-10-CM | POA: Diagnosis present

## 2024-04-07 DIAGNOSIS — M1990 Unspecified osteoarthritis, unspecified site: Secondary | ICD-10-CM | POA: Diagnosis present

## 2024-04-07 DIAGNOSIS — Z808 Family history of malignant neoplasm of other organs or systems: Secondary | ICD-10-CM

## 2024-04-07 DIAGNOSIS — J9622 Acute and chronic respiratory failure with hypercapnia: Secondary | ICD-10-CM | POA: Diagnosis present

## 2024-04-07 DIAGNOSIS — K219 Gastro-esophageal reflux disease without esophagitis: Secondary | ICD-10-CM | POA: Diagnosis present

## 2024-04-07 DIAGNOSIS — F17210 Nicotine dependence, cigarettes, uncomplicated: Secondary | ICD-10-CM | POA: Diagnosis present

## 2024-04-07 DIAGNOSIS — R600 Localized edema: Secondary | ICD-10-CM | POA: Diagnosis not present

## 2024-04-07 DIAGNOSIS — Z681 Body mass index (BMI) 19 or less, adult: Secondary | ICD-10-CM

## 2024-04-07 DIAGNOSIS — Z7952 Long term (current) use of systemic steroids: Secondary | ICD-10-CM

## 2024-04-07 DIAGNOSIS — R54 Age-related physical debility: Secondary | ICD-10-CM | POA: Diagnosis present

## 2024-04-07 DIAGNOSIS — J441 Chronic obstructive pulmonary disease with (acute) exacerbation: Principal | ICD-10-CM | POA: Diagnosis present

## 2024-04-07 DIAGNOSIS — Z8249 Family history of ischemic heart disease and other diseases of the circulatory system: Secondary | ICD-10-CM

## 2024-04-07 DIAGNOSIS — Z90710 Acquired absence of both cervix and uterus: Secondary | ICD-10-CM

## 2024-04-07 DIAGNOSIS — G4733 Obstructive sleep apnea (adult) (pediatric): Secondary | ICD-10-CM | POA: Diagnosis present

## 2024-04-07 DIAGNOSIS — G894 Chronic pain syndrome: Secondary | ICD-10-CM | POA: Diagnosis present

## 2024-04-07 DIAGNOSIS — Z833 Family history of diabetes mellitus: Secondary | ICD-10-CM

## 2024-04-07 DIAGNOSIS — Z9981 Dependence on supplemental oxygen: Secondary | ICD-10-CM

## 2024-04-07 DIAGNOSIS — Z96642 Presence of left artificial hip joint: Secondary | ICD-10-CM | POA: Diagnosis present

## 2024-04-07 DIAGNOSIS — Z79899 Other long term (current) drug therapy: Secondary | ICD-10-CM

## 2024-04-07 DIAGNOSIS — J9621 Acute and chronic respiratory failure with hypoxia: Principal | ICD-10-CM | POA: Diagnosis present

## 2024-04-07 DIAGNOSIS — Z1152 Encounter for screening for COVID-19: Secondary | ICD-10-CM

## 2024-04-07 DIAGNOSIS — R64 Cachexia: Secondary | ICD-10-CM | POA: Diagnosis present

## 2024-04-07 LAB — I-STAT VENOUS BLOOD GAS, ED
Acid-Base Excess: 12 mmol/L — ABNORMAL HIGH (ref 0.0–2.0)
Bicarbonate: 38.7 mmol/L — ABNORMAL HIGH (ref 20.0–28.0)
Calcium, Ion: 1.1 mmol/L — ABNORMAL LOW (ref 1.15–1.40)
HCT: 32 % — ABNORMAL LOW (ref 36.0–46.0)
Hemoglobin: 10.9 g/dL — ABNORMAL LOW (ref 12.0–15.0)
O2 Saturation: 99 %
Potassium: 5.7 mmol/L — ABNORMAL HIGH (ref 3.5–5.1)
Sodium: 137 mmol/L (ref 135–145)
TCO2: 41 mmol/L — ABNORMAL HIGH (ref 22–32)
pCO2, Ven: 63.6 mmHg — ABNORMAL HIGH (ref 44–60)
pH, Ven: 7.392 (ref 7.25–7.43)
pO2, Ven: 144 mmHg — ABNORMAL HIGH (ref 32–45)

## 2024-04-07 LAB — RESP PANEL BY RT-PCR (RSV, FLU A&B, COVID)  RVPGX2
Influenza A by PCR: NEGATIVE
Influenza B by PCR: NEGATIVE
Resp Syncytial Virus by PCR: NEGATIVE
SARS Coronavirus 2 by RT PCR: NEGATIVE

## 2024-04-07 LAB — COMPREHENSIVE METABOLIC PANEL WITH GFR
ALT: 13 U/L (ref 0–44)
AST: 19 U/L (ref 15–41)
Albumin: 3.4 g/dL — ABNORMAL LOW (ref 3.5–5.0)
Alkaline Phosphatase: 27 U/L — ABNORMAL LOW (ref 38–126)
Anion gap: 9 (ref 5–15)
BUN: 8 mg/dL (ref 8–23)
CO2: 31 mmol/L (ref 22–32)
Calcium: 8.7 mg/dL — ABNORMAL LOW (ref 8.9–10.3)
Chloride: 100 mmol/L (ref 98–111)
Creatinine, Ser: 0.62 mg/dL (ref 0.44–1.00)
GFR, Estimated: >60 mL/min (ref >60)
Glucose, Bld: 98 mg/dL (ref 70–99)
Potassium: 3.7 mmol/L (ref 3.5–5.1)
Sodium: 140 mmol/L (ref 135–145)
Total Bilirubin: 0.8 mg/dL (ref 0.0–1.2)
Total Protein: 5.3 g/dL — ABNORMAL LOW (ref 6.5–8.1)

## 2024-04-07 LAB — CBC
HCT: 33.9 % — ABNORMAL LOW (ref 36.0–46.0)
Hemoglobin: 10.2 g/dL — ABNORMAL LOW (ref 12.0–15.0)
MCH: 29 pg (ref 26.0–34.0)
MCHC: 30.1 g/dL (ref 30.0–36.0)
MCV: 96.3 fL (ref 80.0–100.0)
Platelets: 310 K/uL (ref 150–400)
RBC: 3.52 MIL/uL — ABNORMAL LOW (ref 3.87–5.11)
RDW: 15.2 % (ref 11.5–15.5)
WBC: 4.7 K/uL (ref 4.0–10.5)
nRBC: 0 % (ref 0.0–0.2)

## 2024-04-07 LAB — D-DIMER, QUANTITATIVE: D-Dimer, Quant: <0.27 ug{FEU}/mL (ref 0.00–0.50)

## 2024-04-07 LAB — BRAIN NATRIURETIC PEPTIDE: B Natriuretic Peptide: 83.3 pg/mL (ref 0.0–100.0)

## 2024-04-07 LAB — TROPONIN I (HIGH SENSITIVITY)
Troponin I (High Sensitivity): 8 ng/L (ref <18)
Troponin I (High Sensitivity): 8 ng/L (ref <18)

## 2024-04-07 MED ORDER — MAGNESIUM SULFATE 2 GM/50ML IV SOLN
2.0000 g | Freq: Once | INTRAVENOUS | Status: AC
Start: 2024-04-07 — End: 2024-04-07
  Administered 2024-04-07: 2 g via INTRAVENOUS
  Filled 2024-04-07: qty 50

## 2024-04-07 MED ORDER — ALBUTEROL SULFATE (2.5 MG/3ML) 0.083% IN NEBU
10.0000 mg | INHALATION_SOLUTION | Freq: Once | RESPIRATORY_TRACT | Status: AC
  Administered 2024-04-07: 10 mg via RESPIRATORY_TRACT
  Filled 2024-04-07: qty 12

## 2024-04-07 MED ORDER — IPRATROPIUM BROMIDE 0.02 % IN SOLN
0.5000 mg | Freq: Once | RESPIRATORY_TRACT | Status: AC
  Administered 2024-04-07: 0.5 mg via RESPIRATORY_TRACT
  Filled 2024-04-07: qty 2.5

## 2024-04-07 MED ORDER — METHYLPREDNISOLONE SODIUM SUCC 125 MG IJ SOLR
125.0000 mg | Freq: Once | INTRAMUSCULAR | Status: AC
  Administered 2024-04-07: 125 mg via INTRAVENOUS
  Filled 2024-04-07: qty 2

## 2024-04-07 MED ORDER — IPRATROPIUM-ALBUTEROL 0.5-2.5 (3) MG/3ML IN SOLN: 3.0000 mL | Freq: Once | RESPIRATORY_TRACT | Status: DC

## 2024-04-07 NOTE — ED Triage Notes (Signed)
 Here by POV from home for L arm pain (onset today), BLE swelling, pain and numbness ongoing for >1 week. Also endorses sob. H/o COPD, and is on home O2. SPo2 at registration is 78% on 2L O2. Alert, NAD, calm, interactive, resps e/u, speaking in clear complete sentences. Skin W&D.

## 2024-04-07 NOTE — ED Notes (Signed)
 BIPAP removed off of PT per her request. PT's O2 stats are 99% RA at this time without it.

## 2024-04-07 NOTE — ED Notes (Addendum)
 PT pk with being stuck by phlebotomy at this time

## 2024-04-07 NOTE — ED Notes (Signed)
 CCMD called, pt on monitor

## 2024-04-07 NOTE — ED Provider Notes (Signed)
 [facility] Provider Note   CSN: [id number] Arrival date & time: 04/07/24  1152     Patient presents with: Shortness of Breath and Leg Swelling   [name] is a 68 y.o. female with past medical history of former tobacco abuse, COPD, HTN, chronic O2 use presents emergency department for evaluation of bilateral leg swelling, shortness of breath.  Leg swelling has been intermittently occurring over the past week.  She takes her Lasix in the morning but occasionally skips a day as it makes her pee too much.  She elevates her legs with improvement of swelling.  No pain to legs bilaterally.  Denies recent travel, recent surgeries, history of DVT/PE, hemoptysis, exogenous hormones.  Also complains of shortness of breath, chest pain.  Normally is wearing 3 L O2 at home.  Has been a smoker on and off for the past 50 years.  Longest period smoking continuously was for 17 years.  Reports that a pack of cigarettes typically lasts her 3-4 days.  Left-sided chest pain started this morning and radiates into left arm.  8/10 in intensity.  Pain is intermittent and worsened with exertion.  No pain in the back.     Shortness of Breath      Prior to Admission medications   Medication Sig Start Date End Date Taking? Authorizing Provider  albuterol  (PROVENTIL ) (2.5 MG/3ML) 0.083% nebulizer solution Take 2.5 mg by nebulization every 6 (six) hours as needed for wheezing or shortness of breath.   Yes [provider]  albuterol  (VENTOLIN  HFA) 108 (90 Base) MCG/ACT inhaler Inhale 2 puffs into the lungs every 4 (four) hours as needed for wheezing or shortness of breath. 07/24/23  Yes Dewald, Dorn NOVAK, MD  atorvastatin  (LIPITOR) 10 MG tablet Take 10 mg by mouth at bedtime.   Yes [provider]  benzonatate  (TESSALON ) 100 MG capsule Take 1 capsule (100 mg total) by mouth 3 (three) times daily as needed for cough. 03/30/24  Yes Raenelle Coria, MD   enalapril  (VASOTEC ) 10 MG tablet Take 1 tablet (10 mg total) by mouth daily. 01/21/24  Yes [name], MD  furosemide (LASIX) 20 MG tablet Take 20 mg by mouth daily as needed. 02/03/24  Yes [provider]  ipratropium-albuterol  (DUONEB) 0.5-2.5 (3) MG/3ML SOLN Take 3 mLs by nebulization every 6 (six) hours as needed. Patient taking differently: Take 3 mLs by nebulization 3 (three) times daily. 12/06/23  Yes Akula, Vijaya, MD  mirtazapine  (REMERON ) 7.5 MG tablet Take 7.5 mg by mouth at bedtime. 09/26/23  Yes [provider]  Multiple Vitamin (MULTIVITAMIN) capsule Take 1 capsule by mouth daily with breakfast.   Yes [provider]  oxyCODONE -acetaminophen  (PERCOCET) 10-325 MG tablet Take 1 tablet by mouth 5 (five) times daily as needed for severe pain (pain score 7-10).   Yes [provider]  OXYGEN Inhale 3 L/min into the lungs as needed (for shortness of breath).   Yes [provider]  polyethylene glycol (MIRALAX  / GLYCOLAX ) 17 g packet Take 17 g by mouth daily as needed for mild constipation. 05/27/23  Yes Tobie Yetta HERO, MD  Potassium 99 MG TABS Take 99 mg by mouth daily.   Yes [provider]  PRESCRIPTION MEDICATION See admin instructions. CPAP- At bedtime   Yes [provider]  busPIRone  (BUSPAR ) 7.5 MG tablet Take 7.5 mg by mouth 2 (two) times daily. Patient not taking: Reported on 04/07/2024 03/26/24   [provider]  famotidine  (PEPCID ) 40 MG tablet Take 40 mg by mouth daily as needed for heartburn or indigestion. Patient not taking: Reported on 04/07/2024    [provider]  fluticasone -salmeterol (ADVAIR  DISKUS) 250-50 MCG/ACT AEPB Inhale 1 puff into the lungs in the morning and at bedtime. Patient not taking: Reported on 04/07/2024 01/02/24   [name], MD  hydrOXYzine  (ATARAX ) 25 MG tablet Take 25 mg by mouth 3 (three) times daily as needed for anxiety. Patient not taking: Reported on 04/07/2024  09/25/23   [provider]    Allergies: Patient has no known allergies.    Review of Systems  Respiratory:  Positive for shortness of breath.     Updated Vital Signs BP 99/64   Pulse 77   Temp 98.2 F (36.8 C) (Oral)   Resp 19   Ht 5' 1 (1.549 m)   Wt 43.1 kg   SpO2 100%   BMI 17.95 kg/m   Physical Exam Vitals and nursing note reviewed.  Constitutional:      General: She is not in acute distress.    Appearance: Normal appearance.  HENT:     Head: Normocephalic and atraumatic.  Eyes:     Conjunctiva/sclera: Conjunctivae normal.  Cardiovascular:     Rate and Rhythm: Normal rate.     Pulses:          Dorsalis pedis pulses are 1+ on the right side and 1+ on the left side.  Pulmonary:     Effort: Pulmonary effort is normal. No respiratory distress.  Chest:     Comments: Tachypnea at 22b/min. Lund sounds significantly decreased with mild expiratory wheezing in all lobes. Unable to finish complete sentence. Maintaining O2 sats on her normal 3L O2 Musculoskeletal:     Right lower leg: 2+ Edema present.     Left lower leg: 2+ Edema present.  Skin:    Coloration: Skin is not jaundiced or pale.  Neurological:     Mental Status: She is alert. Mental status is at baseline.     (all labs ordered are listed, but only abnormal results are displayed) Labs Reviewed  CBC - Abnormal; Notable for the following components:      Result Value   RBC 3.52 (*)    Hemoglobin 10.2 (*)    HCT 33.9 (*)    All other components within normal limits  COMPREHENSIVE METABOLIC PANEL WITH GFR - Abnormal; Notable for the following components:   Calcium  8.7 (*)    Total Protein 5.3 (*)    Albumin 3.4 (*)    Alkaline Phosphatase 27 (*)    All other components within normal limits  I-STAT VENOUS BLOOD GAS, ED - Abnormal; Notable for the following components:   pCO2, Ven 63.6 (*)    pO2, Ven 144 (*)    Bicarbonate 38.7 (*)    TCO2 41 (*)    Acid-Base Excess 12.0 (*)    Potassium  5.7 (*)    Calcium , Ion 1.10 (*)    HCT 32.0 (*)    Hemoglobin 10.9 (*)    All other components within normal limits  RESP PANEL BY RT-PCR (RSV, FLU A&B, COVID)  RVPGX2  BRAIN NATRIURETIC PEPTIDE  D-DIMER, QUANTITATIVE  TROPONIN I (HIGH SENSITIVITY)  TROPONIN I (HIGH SENSITIVITY)    EKG: EKG Interpretation Date/Time:  Tuesday April 07 2024 12:29:25 EST Ventricular Rate:  73 PR Interval:  144 QRS Duration:  68 QT Interval:  346 QTC Calculation: 381 R Axis:   96  Text  Interpretation: Sinus rhythm with marked sinus arrhythmia Rightward axis Nonspecific T wave abnormality Abnormal ECG When compared with ECG of 27-Mar-2024 13:58, PREVIOUS ECG IS PRESENT Confirmed by Yolande Charleston [phone]) on 04/07/2024 5:43:08 PM  Radiology: DG Chest 2 View Result Date: 04/07/2024 EXAM: 2 VIEW(S) XRAY OF THE CHEST 04/07/2024 12:59:00 PM COMPARISON: 03/27/2024 CLINICAL HISTORY: Shortness of breath. FINDINGS: LUNGS AND PLEURA: Emphysema with hyperinflation, compatible with history of COPD. No pulmonary edema. No pleural effusion. No pneumothorax. HEART AND MEDIASTINUM: No acute abnormality of the cardiac and mediastinal silhouettes. BONES AND SOFT TISSUES: Degenerative changes of the thoracic spine. No acute osseous abnormality. IMPRESSION: 1. No acute cardiopulmonary findings. 2. COPD. Electronically signed by: Harrietta Sherry MD 04/07/2024 01:25 PM EST RP Workstation: [id number]     .Critical Care  Performed by: Minnie Tinnie BRAVO, PA Authorized by: Minnie Tinnie BRAVO, PA   Critical care provider statement:    Critical care time (minutes):  45   Critical care was necessary to treat or prevent imminent or life-threatening deterioration of the following conditions:  Respiratory failure   Critical care was time spent personally by me on the following activities:  Discussions with consultants, discussions with primary provider, evaluation of patient's response to treatment, examination of patient,  interpretation of cardiac output measurements, obtaining history from patient or surrogate, ordering and performing treatments and interventions, ordering and review of laboratory studies, pulse oximetry and re-evaluation of patient's condition   Care discussed with: admitting provider      Medications Ordered in the ED  methylPREDNISolone  sodium succinate (SOLU-MEDROL ) 125 mg/2 mL injection 125 mg (125 mg Intravenous Given 04/07/24 1917)  magnesium  sulfate IVPB 2 g 50 mL (0 g Intravenous Stopped 04/07/24 2144)  ipratropium (ATROVENT) nebulizer solution 0.5 mg (0.5 mg Nebulization Given 04/07/24 1801)  albuterol  (PROVENTIL ) (2.5 MG/3ML) 0.083% nebulizer solution 10 mg (10 mg Nebulization Given 04/07/24 1801)                                    Medical Decision Making Amount and/or Complexity of Data Reviewed Labs: ordered. Radiology: ordered.  Risk Prescription drug management. Decision regarding hospitalization.   Patient presents to the ED for concern of SHOB, leg swelling, this involves an extensive number of treatment options, and is a complaint that carries with it a high risk of complications and morbidity.  The differential diagnosis includes viral URI, PNA, fluid overload, ACS, electrolyte abnormality, COPD, PE   Co morbidities that complicate the patient evaluation  COPD O2 dependant   Additional history obtained:  Additional history obtained from Nursing and Outside Medical Records   External records from outside source obtained and reviewed including triage RN note   Lab Tests:  I Ordered, and personally interpreted labs.  The pertinent results include:   BNP WNL No leukocytosis Hgb 10.2   Imaging Studies ordered:  I ordered imaging studies including CXR  I independently visualized and interpreted imaging which showed signs of COPD but no effusion nor PNA I agree with the radiologist interpretation   Cardiac Monitoring:  The patient was maintained on  a cardiac monitor.  I personally viewed and interpreted the cardiac monitored which showed an underlying rhythm of: NSR at 73bpm   Medicines ordered and prescription drug management:  I ordered medication including solumedrol, duoneb, magnesium   for COPD exacerbation  Reevaluation of the patient after these medicines showed that the patient improved I have reviewed the patients  home medicines and have made adjustments as needed    Critical Interventions:  Respiratory failure   Consultations Obtained:  I requested consultation with the hospitalist,  and discussed lab and imaging findings as well as pertinent plan - Dr. Alfornia accepts patient for admission   Problem List / ED Course:  COPD exacerbation Patient initially presented to emergency department with saturations of 78% on her typical 2 L noted by triage RN note On my exam, patient has significantly decreased lung sounds in all fields with expiratory wheezing.  She is unable to complete a full sentence and has tachypnea.  Did provide Solu-Medrol , DuoNeb, magnesium  and placed patient on BiPAP due to significant work of breathing and poor air movement.  She significantly improved following BiPAP as was taken off per her request as she reported that she was feeling better. Unfortunately, patient was a hard stick and required multiple attempts for lab work, IV access.  She reported her frustration about multiple sticks.  IV team was consulted to obtained IV however patient reports that it hurt and she did not feel it was placed correctly.  I discussed that I needed an IV for additional medications if required, completion of IV mag, repeat draw of troponin and dimer however patient refused to obtain this.  I went to go reassess patient when I did not see her in her room.  I was then informed that patient removed her IV and it was resting on the bed and she did not wish to have any additional workup.  Charge nurse found patient at charge  desk in significant respiratory distress as patient was not wearing her oxygen.  She was taken back to her room by the charge nurse.  She then allowed us  to obtain IV access, repeat lab work.  She was not on her O2 when she got to room and was satting 87%.  We placed her back on her typical 3 L with improvement of shortness of breath and sats returned to 100% Lab work notable for negative respiratory panel.  Troponin negative x 2.   Patient would benefit from admission for COPD exacerbation  Pedal edema Nonpitting.  Equally 2+ bilaterally.  No calf tenderness. Reports this edema improves when she lifts her legs up on recliner and may be dependent edema BMP WNL.  Chest x-ray without signs of fluid overload Reports that she has been taking her Lasix at home although she occasionally skips a day as it makes her urinate too much Dimer negative.  Other than age, has no risk factors.  Low suspicion of DVT, PE   Reevaluation:  After the interventions noted above, I reevaluated the patient and found that they have :improved    Dispostion:  After consideration of the diagnostic results and the patients response to treatment, I feel that the patent would benefit from admission.   Discussed ED workup, dispo.  Patient expressed understanding agrees with plan.  All questions answered to her satisfaction.  Final diagnoses:  COPD exacerbation [facility])  Peripheral edema    ED Discharge Orders     None        Minnie Tinnie BRAVO, PA 04/08/24 0004    Yolande Lamar BROCKS, MD 04/08/24 1057

## 2024-04-07 NOTE — ED Provider Triage Note (Signed)
 Emergency Medicine Provider Triage Evaluation Note  [name] , a 68 y.o. female  was evaluated in triage.  Pt complains of chest pain, shortness of breath, bilateral leg swelling and weakness, as well as intermittent pain that shoots down her L arm. Denies trauma/injury. Denies hx of heart failure but does appreciate history of COPD. Unsure of fever or chills. Vital signs here stable, patient on home 3L of oxygen. Not in respiratory distress, talking on full sentences on room air.  Review of Systems  Positive: Chest pain, shortness of breath, L arm pain, bilateral leg swelling Negative: Trauma/injury, fever, chills  Physical Exam  BP 135/73 (BP Location: Left Arm)   Pulse 64   Temp 97.9 F (36.6 C)   Resp 17   Ht 5' 1 (1.549 m)   Wt 43.1 kg   SpO2 98%   BMI 17.95 kg/m  Gen:   Awake, no distress   Resp:  Normal effort, on home 3L not in respiratory distress, talking in full sentences on room air MSK:   Moves extremities without difficulty  Other:  Bilateral lower extremity edema  Medical Decision Making  Medically screening exam initiated at 12:32 PM.  Appropriate orders placed.  [name] was informed that the remainder of the evaluation will be completed by another provider, this initial triage assessment does not replace that evaluation, and the importance of remaining in the ED until their evaluation is complete.  Orders: CBC, CMP, CXR, EKG, BNP, trops   [name], [place] 04/07/24 1240

## 2024-04-08 DIAGNOSIS — E785 Hyperlipidemia, unspecified: Secondary | ICD-10-CM | POA: Diagnosis present

## 2024-04-08 DIAGNOSIS — Z808 Family history of malignant neoplasm of other organs or systems: Secondary | ICD-10-CM | POA: Diagnosis not present

## 2024-04-08 DIAGNOSIS — I10 Essential (primary) hypertension: Secondary | ICD-10-CM | POA: Diagnosis present

## 2024-04-08 DIAGNOSIS — Z7401 Bed confinement status: Secondary | ICD-10-CM | POA: Diagnosis not present

## 2024-04-08 DIAGNOSIS — F32A Depression, unspecified: Secondary | ICD-10-CM | POA: Diagnosis present

## 2024-04-08 DIAGNOSIS — F17210 Nicotine dependence, cigarettes, uncomplicated: Secondary | ICD-10-CM | POA: Diagnosis present

## 2024-04-08 DIAGNOSIS — J9621 Acute and chronic respiratory failure with hypoxia: Secondary | ICD-10-CM | POA: Diagnosis present

## 2024-04-08 DIAGNOSIS — Z96642 Presence of left artificial hip joint: Secondary | ICD-10-CM | POA: Diagnosis present

## 2024-04-08 DIAGNOSIS — J441 Chronic obstructive pulmonary disease with (acute) exacerbation: Secondary | ICD-10-CM

## 2024-04-08 DIAGNOSIS — R0602 Shortness of breath: Secondary | ICD-10-CM | POA: Diagnosis not present

## 2024-04-08 DIAGNOSIS — Z833 Family history of diabetes mellitus: Secondary | ICD-10-CM | POA: Diagnosis not present

## 2024-04-08 DIAGNOSIS — Z9981 Dependence on supplemental oxygen: Secondary | ICD-10-CM | POA: Diagnosis not present

## 2024-04-08 DIAGNOSIS — K219 Gastro-esophageal reflux disease without esophagitis: Secondary | ICD-10-CM | POA: Diagnosis present

## 2024-04-08 DIAGNOSIS — G4733 Obstructive sleep apnea (adult) (pediatric): Secondary | ICD-10-CM | POA: Diagnosis present

## 2024-04-08 DIAGNOSIS — Z801 Family history of malignant neoplasm of trachea, bronchus and lung: Secondary | ICD-10-CM | POA: Diagnosis not present

## 2024-04-08 DIAGNOSIS — F1120 Opioid dependence, uncomplicated: Secondary | ICD-10-CM | POA: Diagnosis present

## 2024-04-08 DIAGNOSIS — Z90710 Acquired absence of both cervix and uterus: Secondary | ICD-10-CM | POA: Diagnosis not present

## 2024-04-08 DIAGNOSIS — Z8249 Family history of ischemic heart disease and other diseases of the circulatory system: Secondary | ICD-10-CM | POA: Diagnosis not present

## 2024-04-08 DIAGNOSIS — R54 Age-related physical debility: Secondary | ICD-10-CM | POA: Diagnosis present

## 2024-04-08 DIAGNOSIS — R600 Localized edema: Secondary | ICD-10-CM | POA: Diagnosis present

## 2024-04-08 DIAGNOSIS — Z1152 Encounter for screening for COVID-19: Secondary | ICD-10-CM | POA: Diagnosis not present

## 2024-04-08 DIAGNOSIS — R64 Cachexia: Secondary | ICD-10-CM | POA: Diagnosis present

## 2024-04-08 DIAGNOSIS — J9622 Acute and chronic respiratory failure with hypercapnia: Secondary | ICD-10-CM | POA: Diagnosis present

## 2024-04-08 DIAGNOSIS — Z7952 Long term (current) use of systemic steroids: Secondary | ICD-10-CM | POA: Diagnosis not present

## 2024-04-08 DIAGNOSIS — Z79899 Other long term (current) drug therapy: Secondary | ICD-10-CM | POA: Diagnosis not present

## 2024-04-08 DIAGNOSIS — G894 Chronic pain syndrome: Secondary | ICD-10-CM | POA: Diagnosis present

## 2024-04-08 DIAGNOSIS — Z681 Body mass index (BMI) 19 or less, adult: Secondary | ICD-10-CM | POA: Diagnosis not present

## 2024-04-08 LAB — BLOOD GAS, VENOUS
Acid-Base Excess: 9.1 mmol/L — ABNORMAL HIGH (ref 0.0–2.0)
Bicarbonate: 35.2 mmol/L — ABNORMAL HIGH (ref 20.0–28.0)
O2 Saturation: 86.4 %
Patient temperature: 36.8
pCO2, Ven: 53 mmHg (ref 44–60)
pH, Ven: 7.43 (ref 7.25–7.43)
pO2, Ven: 51 mmHg — ABNORMAL HIGH (ref 32–45)

## 2024-04-08 MED ORDER — IPRATROPIUM-ALBUTEROL 0.5-2.5 (3) MG/3ML IN SOLN
3.0000 mL | Freq: Once | RESPIRATORY_TRACT | Status: AC
  Administered 2024-04-08: 3 mL via RESPIRATORY_TRACT
  Filled 2024-04-08: qty 3

## 2024-04-08 MED ORDER — BENZONATATE 100 MG PO CAPS
100.0000 mg | ORAL_CAPSULE | Freq: Two times a day (BID) | ORAL | Status: DC | PRN
Start: 2024-04-08 — End: 2024-04-12
  Administered 2024-04-08: 100 mg via ORAL
  Filled 2024-04-08: qty 1

## 2024-04-08 MED ORDER — OXYCODONE HCL 5 MG PO TABS
5.0000 mg | ORAL_TABLET | Freq: Every day | ORAL | Status: DC | PRN
  Administered 2024-04-08 – 2024-04-12 (×15): 5 mg via ORAL
  Filled 2024-04-08 (×16): qty 1

## 2024-04-08 MED ORDER — OXYCODONE-ACETAMINOPHEN 5-325 MG PO TABS
1.0000 | ORAL_TABLET | Freq: Every day | ORAL | Status: DC | PRN
Start: 2024-04-08 — End: 2024-04-12
  Administered 2024-04-08 – 2024-04-12 (×17): 1 via ORAL
  Filled 2024-04-08 (×17): qty 1

## 2024-04-08 MED ORDER — IPRATROPIUM-ALBUTEROL 0.5-2.5 (3) MG/3ML IN SOLN
3.0000 mL | Freq: Two times a day (BID) | RESPIRATORY_TRACT | Status: DC
  Administered 2024-04-08 – 2024-04-09 (×2): 3 mL via RESPIRATORY_TRACT
  Filled 2024-04-08 (×2): qty 3

## 2024-04-08 MED ORDER — METHYLPREDNISOLONE SODIUM SUCC 125 MG IJ SOLR
120.0000 mg | INTRAMUSCULAR | Status: DC
  Administered 2024-04-08 – 2024-04-12 (×5): 120 mg via INTRAVENOUS
  Filled 2024-04-08 (×5): qty 2

## 2024-04-08 MED ORDER — LORAZEPAM 1 MG PO TABS
1.0000 mg | ORAL_TABLET | Freq: Four times a day (QID) | ORAL | Status: DC | PRN
Start: 2024-04-08 — End: 2024-04-12
  Administered 2024-04-08 – 2024-04-12 (×12): 1 mg via ORAL
  Filled 2024-04-08 (×12): qty 1

## 2024-04-08 MED ORDER — ATORVASTATIN CALCIUM 10 MG PO TABS
10.0000 mg | ORAL_TABLET | Freq: Every day | ORAL | Status: DC
  Administered 2024-04-08 – 2024-04-09 (×2): 10 mg via ORAL
  Filled 2024-04-08 (×2): qty 1

## 2024-04-08 MED ORDER — ENALAPRIL MALEATE 10 MG PO TABS
10.0000 mg | ORAL_TABLET | Freq: Every day | ORAL | Status: DC
  Administered 2024-04-08 – 2024-04-12 (×5): 10 mg via ORAL
  Filled 2024-04-08 (×5): qty 1

## 2024-04-08 MED ORDER — ENOXAPARIN SODIUM 30 MG/0.3ML IJ SOSY
30.0000 mg | PREFILLED_SYRINGE | INTRAMUSCULAR | Status: DC
  Administered 2024-04-08 – 2024-04-11 (×4): 30 mg via SUBCUTANEOUS
  Filled 2024-04-08 (×4): qty 0.3

## 2024-04-08 MED ORDER — OXYCODONE-ACETAMINOPHEN 10-325 MG PO TABS
1.0000 | ORAL_TABLET | Freq: Every day | ORAL | Status: DC | PRN
Start: 2024-04-08 — End: 2024-04-08

## 2024-04-08 MED ORDER — MIRTAZAPINE 15 MG PO TABS
7.5000 mg | ORAL_TABLET | Freq: Every day | ORAL | Status: DC
  Administered 2024-04-08 – 2024-04-11 (×4): 7.5 mg via ORAL
  Filled 2024-04-08 (×4): qty 1

## 2024-04-08 MED ORDER — GUAIFENESIN ER 600 MG PO TB12
600.0000 mg | ORAL_TABLET | Freq: Two times a day (BID) | ORAL | Status: DC
  Administered 2024-04-08 – 2024-04-12 (×8): 600 mg via ORAL
  Filled 2024-04-08 (×9): qty 1

## 2024-04-08 MED ORDER — DOXYCYCLINE HYCLATE 100 MG PO TABS
100.0000 mg | ORAL_TABLET | Freq: Two times a day (BID) | ORAL | Status: DC
  Administered 2024-04-08 – 2024-04-12 (×9): 100 mg via ORAL
  Filled 2024-04-08 (×9): qty 1

## 2024-04-08 MED ORDER — ALBUTEROL SULFATE (2.5 MG/3ML) 0.083% IN NEBU
2.5000 mg | INHALATION_SOLUTION | RESPIRATORY_TRACT | Status: DC | PRN
Start: 2024-04-08 — End: 2024-04-12
  Administered 2024-04-10 – 2024-04-12 (×3): 2.5 mg via RESPIRATORY_TRACT
  Filled 2024-04-08 (×3): qty 3

## 2024-04-08 MED ORDER — IPRATROPIUM-ALBUTEROL 0.5-2.5 (3) MG/3ML IN SOLN
3.0000 mL | Freq: Four times a day (QID) | RESPIRATORY_TRACT | Status: DC
  Administered 2024-04-08: 3 mL via RESPIRATORY_TRACT
  Filled 2024-04-08: qty 3

## 2024-04-08 MED ORDER — OXYCODONE HCL 5 MG PO TABS
5.0000 mg | ORAL_TABLET | Freq: Once | ORAL | Status: AC
  Administered 2024-04-08: 5 mg via ORAL
  Filled 2024-04-08: qty 1

## 2024-04-08 MED ORDER — BUDESONIDE 0.25 MG/2ML IN SUSP
0.2500 mg | Freq: Two times a day (BID) | RESPIRATORY_TRACT | Status: DC
  Administered 2024-04-08 – 2024-04-12 (×9): 0.25 mg via RESPIRATORY_TRACT
  Filled 2024-04-08 (×9): qty 2

## 2024-04-08 MED ORDER — NALOXONE HCL 0.4 MG/ML IJ SOLN: 0.4000 mg | INTRAMUSCULAR | Status: DC | PRN

## 2024-04-08 NOTE — Progress Notes (Signed)
   04/08/24 2314  BiPAP/CPAP/SIPAP  $ Non-Invasive Home Ventilator  Initial  $ Face Mask Medium Yes  BiPAP/CPAP/SIPAP Pt Type Adult  BiPAP/CPAP/SIPAP DREAMSTATIOND  Mask Type Full face mask  Dentures removed? Not applicable  Mask Size Medium  FiO2 (%) 98 %  Patient Home Machine No  Patient Home Mask No  Patient Home Tubing No  Auto Titrate Yes  CPAP/SIPAP surface wiped down Yes  Device Plugged into RED Power Outlet Yes  BiPAP/CPAP /SiPAP Vitals  Resp 18  SpO2 100 %  Bilateral Breath Sounds Diminished;Expiratory wheezes  MEWS Score/Color  MEWS Score 0  MEWS Score Color [place]

## 2024-04-08 NOTE — Progress Notes (Signed)
 Same day note  Patient seen and examined at bedside.  Patient was admitted to the hospital for shortness of breath, dyspnea.  At the time of my evaluation, patient complains of feeling a little better with shortness of breath but he still has dyspnea on exertion, wheezing  Physical examination reveals obese built female, wheezing noted, on nasal cannula oxygen  Laboratory data and imaging was reviewed  Assessment and Plan.  [name] is a 68 y.o. female with past medical history of COPD on 2 L of oxygen at baseline, chronic cigarette smoking, obstructive sleep apnea on CPAP, hypertension, hyperlipidemia, GERD, anxiety presented to hospital with shortness of breath chest pain and bilateral lower extremity edema.  Of note patient had recent admission for COPD exacerbation.  On this presentation patient had pulse ox of 78% on 2 L of nasal cannula oxygen with wheezing and tachypnea.  Labs were notable for no leukocytosis.  Troponins were negative.  BNP within normal range.  COVID influenza RSV  PCR was negative.  D-dimer negative.  Chest x-ray without any infiltrate.  She was initially put on BiPAP and was considered for admission to the hospital.  At baseline she uses 3 L oxygen during the day constantly and at nighttime she uses 2 L.  Uses CPAP sometimes.    Acute on chronic hypoxemic hypercapnic respiratory failure secondary to acute COPD exacerbation Patient was initially hypoxic with pulse ox of 88% on 2 L of oxygen and had respiratory distress in the ED.  ABG showed hypercapnia.  Was given Solu-Medrol  DuoNebs magnesium  sulfate 2 g and was initially put on BiPAP.  Subsequently was weaned to 3 L of oxygen by nasal cannula.  Continues to have wheezing and shortness of breath at this time.  Will continue treatment with Solu-Medrol  60 mg every 12 hours, DuoNeb every 6 hours, albuterol  neb every 2 hours PRN, Pulmicort  neb twice daily, and start 5-day course of doxycycline .  Continue Mucinex ,  flutter valve.  Continue supplemental oxygen.   OSA Continue CPAP at night.   Hypertension Continue enalapril .  Blood pressure seems to be stable.   Hyperlipidemia Continue Lipitor.   Mood disorder Continue mirtazapine    Chronic pain syndrome Continue home Percocet.   No Charge  Signed,  Vernal Anselm Alstrom, MD [facility]

## 2024-04-08 NOTE — Progress Notes (Signed)
 [facility]   Bed Availability Yes  Level of Care Needed:  Yes  MD Agree to transfer: Yes  Patient agree to transfer: Yes  Sharolyn Batman, RN [facility]

## 2024-04-08 NOTE — ED Notes (Signed)
 Appropriate paperwork given to [facility].

## 2024-04-08 NOTE — ED Notes (Signed)
 Report called to [employer]. No further questions at this time.

## 2024-04-08 NOTE — Hospital Course (Signed)
 Same day note  Patient seen and examined at bedside.  Patient was admitted to the hospital for  At the time of my evaluation, patient complains of  Physical examination reveals  Laboratory data and imaging was reviewed  Assessment and Plan.  [name] is a 68 y.o. female with past medical history of COPD on 2 L of oxygen at baseline, chronic cigarette smoking, obstructive sleep apnea on CPAP, hypertension, hyperlipidemia, GERD, anxiety presented to hospital with shortness of breath chest pain and bilateral lower extremity edema.  Of note patient had recent admission for COPD exacerbation.  On this presentation patient had pulse ox of 78% on 2 L of nasal cannula oxygen with wheezing and tachypnea.  Labs were notable for no leukocytosis.  Troponins were negative.  BNP within normal range.  COVID influenza RSV  PCR was negative.  D-dimer negative.  Chest x-ray without any infiltrate.  She was initially put on BiPAP and was considered for admission to the hospital.  At baseline she uses 3 L oxygen during the day constantly and at nighttime she uses 2 L.  Uses CPAP sometimes.    Acute on chronic hypoxemic hypercapnic respiratory failure secondary to acute COPD exacerbation Patient was initially hypoxic with pulse ox of 88% on 2 L of oxygen and had respiratory distress in the ED.  ABG showed hypercapnia.  Was given Solu-Medrol  DuoNebs magnesium  sulfate 2 g and was initially put on BiPAP.  Subsequently was weaned to 3 L of oxygen by nasal cannula.  Continues to have wheezing and shortness of breath at this time.  Will continue treatment with Solu-Medrol  60 mg every 12 hours, DuoNeb every 6 hours, albuterol  neb every 2 hours PRN, Pulmicort  neb twice daily, and start 5-day course of doxycycline .  Continue Mucinex , flutter valve.  Continue supplemental oxygen.   OSA Continue CPAP at night.   Hypertension Continue enalapril .  Blood pressure seems to be stable.   Hyperlipidemia Continue Lipitor.    Mood disorder Continue mirtazapine    Chronic pain syndrome Continue home Percocet.   No Charge  Signed,  Vernal Anselm Alstrom, MD [facility]

## 2024-04-08 NOTE — H&P (Addendum)
 History and Physical    [name] FMW:[id number] DOB: 05-26-1956 DOA: 04/07/2024  PCP: Maree Leni Edyth DELENA, MD  Patient coming from: Home  Chief Complaint: Shortness of breath  HPI: [name] is a 68 y.o. female with medical history significant of COPD on 2 L home oxygen, cigarette smoking, OSA on CPAP, hypertension, hyperlipidemia, GERD, anxiety, recent hospital admission 10/31-11/3 for COPD exacerbation presented to ED with complaints of shortness of breath, chest pain, and bilateral lower extremity edema.  SpO2 78% on 2 L [place] on arrival and had wheezing and tachypnea.  No fever or leukocytosis, hemoglobin 10.2 (stable compared to recent labs), troponin negative x 2, BNP normal, COVID/influenza/RSV PCR negative, D-dimer negative, VBG with pH 7.39 and pCO2 63.6.  Chest x-ray showing no acute cardiopulmonary findings.  She was placed on BiPAP and was given Solu-Medrol  125 mg, albuterol  and ipratropium neb treatments, and IV mag 2 g.  Later weaned off BiPAP and satting well on 4 L .  [facility] called to admit.  Patient is reporting 1 week history of worsening shortness of breath, productive cough, wheezing, and bilateral lower extremity edema.  Also reporting substernal chest tightness.  She has been taking Lasix 20 mg daily at home.  Also using her home inhalers and her home nebulizer every 4 hours but her breathing did not improve.  She stopped smoking cigarettes a year ago.  Patient is concerned that she moved into a new apartment a year ago which might have mold and since then she has had multiple hospital visits for breathing problems.  Denies fevers.  She uses 3 L oxygen during the day constantly and at nighttime she uses 2 L.  Uses CPAP sometimes.  No other complaints.  Review of Systems:  Review of Systems  All other systems reviewed and are negative.   Past Medical History:  Diagnosis Date   Anxiety    Arthritis    Asthma    Chest pain 2006   Chest pain 10/2016   Chronic back  pain    COPD (chronic obstructive pulmonary disease) (HCC)    Hypertension    Sciatica    Shortness of breath    due to  smoking     Past Surgical History:  Procedure Laterality Date   ABDOMINAL HYSTERECTOMY  1997   CARDIAC CATHETERIZATION  2006   HEMI-MICRODISCECTOMY LUMBAR LAMINECTOMY LEVEL 1  05/14/2012   Procedure: HEMI-MICRODISCECTOMY LUMBAR LAMINECTOMY LEVEL 1;  Surgeon: Tanda DELENA Heading, MD;  Location: [facility];  Service: Orthopedics;  Laterality: N/A;  Hemi Laminectomy Microdiscectomy L4 - L5 Central (X-Ray)   ingrown toenail removal     TONSILLECTOMY     TOTAL HIP ARTHROPLASTY Left 11/05/2013   Procedure: LEFT TOTAL HIP ARTHROPLASTY;  Surgeon: Tanda DELENA Heading, MD;  Location: [facility];  Service: Orthopedics;  Laterality: Left;     reports that she has been smoking cigarettes. She has a 3 pack-year smoking history. She has never used smokeless tobacco. She reports that she does not drink alcohol and does not use drugs.  No Known Allergies  Family History  Problem Relation Age of Onset   Heart attack Mother 55   Hypertension Sister    Diabetes type II Sister    Tongue cancer Brother    Heart attack Sister 35   Lung cancer Sister    Hypertension Sister     Prior to Admission medications   Medication Sig Start Date End Date Taking? Authorizing Provider  albuterol  (PROVENTIL ) (2.5  MG/3ML) 0.083% nebulizer solution Take 2.5 mg by nebulization every 6 (six) hours as needed for wheezing or shortness of breath.   Yes [provider]  albuterol  (VENTOLIN  HFA) 108 (90 Base) MCG/ACT inhaler Inhale 2 puffs into the lungs every 4 (four) hours as needed for wheezing or shortness of breath. 07/24/23  Yes Dewald, Dorn NOVAK, MD  atorvastatin  (LIPITOR) 10 MG tablet Take 10 mg by mouth at bedtime.   Yes [provider]  benzonatate  (TESSALON ) 100 MG capsule Take 1 capsule (100 mg total) by mouth 3 (three) times daily as needed for cough. 03/30/24  Yes Raenelle Coria, MD   enalapril  (VASOTEC ) 10 MG tablet Take 1 tablet (10 mg total) by mouth daily. 01/21/24  Yes [name], MD  furosemide (LASIX) 20 MG tablet Take 20 mg by mouth daily as needed. 02/03/24  Yes [provider]  ipratropium-albuterol  (DUONEB) 0.5-2.5 (3) MG/3ML SOLN Take 3 mLs by nebulization every 6 (six) hours as needed. Patient taking differently: Take 3 mLs by nebulization 3 (three) times daily. 12/06/23  Yes Akula, Vijaya, MD  mirtazapine  (REMERON ) 7.5 MG tablet Take 7.5 mg by mouth at bedtime. 09/26/23  Yes [provider]  Multiple Vitamin (MULTIVITAMIN) capsule Take 1 capsule by mouth daily with breakfast.   Yes [provider]  oxyCODONE -acetaminophen  (PERCOCET) 10-325 MG tablet Take 1 tablet by mouth 5 (five) times daily as needed for severe pain (pain score 7-10).   Yes [provider]  OXYGEN Inhale 3 L/min into the lungs as needed (for shortness of breath).   Yes [provider]  polyethylene glycol (MIRALAX  / GLYCOLAX ) 17 g packet Take 17 g by mouth daily as needed for mild constipation. 05/27/23  Yes Tobie Yetta HERO, MD  Potassium 99 MG TABS Take 99 mg by mouth daily.   Yes [provider]  PRESCRIPTION MEDICATION See admin instructions. CPAP- At bedtime   Yes [provider]  busPIRone  (BUSPAR ) 7.5 MG tablet Take 7.5 mg by mouth 2 (two) times daily. Patient not taking: Reported on 04/07/2024 03/26/24   [provider]  famotidine  (PEPCID ) 40 MG tablet Take 40 mg by mouth daily as needed for heartburn or indigestion. Patient not taking: Reported on 04/07/2024    [provider]  fluticasone -salmeterol (ADVAIR  DISKUS) 250-50 MCG/ACT AEPB Inhale 1 puff into the lungs in the morning and at bedtime. Patient not taking: Reported on 04/07/2024 01/02/24   [name], MD  hydrOXYzine  (ATARAX ) 25 MG tablet Take 25 mg by mouth 3 (three) times daily as needed for anxiety. Patient not taking: Reported on 04/07/2024  09/25/23   [provider]    Physical Exam: Vitals:   04/08/24 0230 04/08/24 0245 04/08/24 0300 04/08/24 0500  BP: 110/68 97/62 108/63 105/72  Pulse: 72 71 75 78  Resp: 13 15 16 16   Temp:      TempSrc:      SpO2: 100% 100% 100% 100%  Weight:      Height:        Physical Exam Vitals reviewed.  Constitutional:      General: She is not in acute distress. HENT:     Head: Normocephalic and atraumatic.  Eyes:     Extraocular Movements: Extraocular movements intact.  Cardiovascular:     Rate and Rhythm: Normal rate and regular rhythm.     Heart sounds: Normal heart sounds.  Pulmonary:     Effort: No respiratory distress.     Breath sounds: Wheezing present.  Comments: Mildly tachypneic Abdominal:     General: Bowel sounds are normal.     Palpations: Abdomen is soft.     Tenderness: There is no abdominal tenderness. There is no guarding.  Musculoskeletal:     Cervical back: Normal range of motion.     Right lower leg: Edema present.     Left lower leg: Edema present.     Comments: Nonpitting edema of lower extremities  Skin:    General: Skin is warm and dry.  Neurological:     General: No focal deficit present.     Mental Status: She is alert and oriented to person, place, and time.     Labs on Admission: I have personally reviewed following labs and imaging studies  CBC: Recent Labs  Lab 04/07/24 1247 04/07/24 1917  WBC 4.7  --   HGB 10.2* 10.9*  HCT 33.9* 32.0*  MCV 96.3  --   PLT 310  --    Basic Metabolic Panel: Recent Labs  Lab 04/07/24 1247 04/07/24 1917  NA 140 137  K 3.7 5.7*  CL 100  --   CO2 31  --   GLUCOSE 98  --   BUN 8  --   CREATININE 0.62  --   CALCIUM  8.7*  --    GFR: Estimated Creatinine Clearance: 44.5 mL/min (by C-G formula based on SCr of 0.62 mg/dL). Liver Function Tests: Recent Labs  Lab 04/07/24 1247  AST 19  ALT 13  ALKPHOS 27*  BILITOT 0.8  PROT 5.3*  ALBUMIN 3.4*   No results for input(s): LIPASE,  AMYLASE in the last 168 hours. No results for input(s): AMMONIA in the last 168 hours. Coagulation Profile: No results for input(s): INR, PROTIME in the last 168 hours. Cardiac Enzymes: No results for input(s): CKTOTAL, CKMB, CKMBINDEX, TROPONINI in the last 168 hours. BNP (last 3 results) Recent Labs    02/26/24 0031  PROBNP 105.0   HbA1C: No results for input(s): HGBA1C in the last 72 hours. CBG: No results for input(s): GLUCAP in the last 168 hours. Lipid Profile: No results for input(s): CHOL, HDL, LDLCALC, TRIG, CHOLHDL, LDLDIRECT in the last 72 hours. Thyroid  Function Tests: No results for input(s): TSH, T4TOTAL, FREET4, T3FREE, THYROIDAB in the last 72 hours. Anemia Panel: No results for input(s): VITAMINB12, FOLATE, FERRITIN, TIBC, IRON, RETICCTPCT in the last 72 hours. Urine analysis:    Component Value Date/Time   COLORURINE YELLOW 01/21/2024 1557   APPEARANCEUR HAZY (A) 01/21/2024 1557   LABSPEC 1.015 01/21/2024 1557   PHURINE 5.0 01/21/2024 1557   GLUCOSEU NEGATIVE 01/21/2024 1557   HGBUR NEGATIVE 01/21/2024 1557   BILIRUBINUR NEGATIVE 01/21/2024 1557   KETONESUR NEGATIVE 01/21/2024 1557   PROTEINUR NEGATIVE 01/21/2024 1557   UROBILINOGEN 0.2 10/29/2013 1114   NITRITE NEGATIVE 01/21/2024 1557   LEUKOCYTESUR NEGATIVE 01/21/2024 1557    Radiological Exams on Admission: DG Chest 2 View Result Date: 04/07/2024 EXAM: 2 VIEW(S) XRAY OF THE CHEST 04/07/2024 12:59:00 PM COMPARISON: 03/27/2024 CLINICAL HISTORY: Shortness of breath. FINDINGS: LUNGS AND PLEURA: Emphysema with hyperinflation, compatible with history of COPD. No pulmonary edema. No pleural effusion. No pneumothorax. HEART AND MEDIASTINUM: No acute abnormality of the cardiac and mediastinal silhouettes. BONES AND SOFT TISSUES: Degenerative changes of the thoracic spine. No acute osseous abnormality. IMPRESSION: 1. No acute cardiopulmonary findings. 2. COPD.  Electronically signed by: Harrietta Sherry MD 04/07/2024 01:25 PM EST RP Workstation: [id number]    EKG: Independently reviewed.  Sinus rhythm with marked sinus arrhythmia,  artifact.  Assessment and Plan  Acute on chronic hypoxemic hypercapnic respiratory failure secondary to acute COPD exacerbation Oxygen saturation 78% on 2 L [place] and was in respiratory distress on arrival to the ED. initial blood gas showing evidence of hypercapnia.  Patient was given Solu-Medrol , albuterol  and ipratropium neb treatments, and IV mag 2 g in the ED.  Initially required BiPAP but now weaned down to 3 L [place] and satting well.  No respiratory distress at this time but still wheezing on exam.  Chest x-ray showing no acute cardiopulmonary findings.  No fever or leukocytosis.  COVID/influenza/RSV PCR negative.  D-dimer negative.  Continue treatment with Solu-Medrol  60 mg every 12 hours, DuoNeb every 6 hours, albuterol  neb every 2 hours PRN, Pulmicort  neb twice daily, and start 5-day course of doxycycline .  Mucinex , flutter valve.  Continue supplemental oxygen.  Repeat VBG ordered.  OSA CPAP at night.  Hypertension Continue enalapril .  Hyperlipidemia Continue Lipitor.  Mood disorder Continue mirtazapine   Chronic pain syndrome Continue home Percocet.  DVT prophylaxis: Lovenox  Code Status: Full Code (discussed with the patient) Level of care: Progressive Care Unit Admission status: It is my clinical opinion that referral for OBSERVATION is reasonable and necessary in this patient based on the above information provided. The aforementioned taken together are felt to place the patient at high risk for further clinical deterioration. However, it is anticipated that the patient may be medically stable for discharge from the hospital within 24 to 48 hours.  Editha Ram MD [facility]  If 7PM-7AM, please contact night-coverage [email or web address]  04/08/2024, 5:38 AM

## 2024-04-08 NOTE — ED Notes (Signed)
 IV attempted x2.  Asked another rn to attempt IV. PT lines was pulled out by pt on previous shift per pt.

## 2024-04-08 NOTE — ED Notes (Addendum)
 Asked pt if they wanted to transfer to [facility] due to increased wait times for beds upstairs. PT upset at this time and stated They don't want me here and they want me to be transferred because I had a bad experience last time. This RN educated pt and let her know we were happy to provide the care she needed at [place] and it could just be a wait for a bed and [facility] currently has beds available. PT would like to xfer to [place].

## 2024-04-09 DIAGNOSIS — J441 Chronic obstructive pulmonary disease with (acute) exacerbation: Secondary | ICD-10-CM | POA: Diagnosis not present

## 2024-04-09 LAB — CBC
HCT: 35.2 % — ABNORMAL LOW (ref 36.0–46.0)
Hemoglobin: 10.4 g/dL — ABNORMAL LOW (ref 12.0–15.0)
MCH: 29.4 pg (ref 26.0–34.0)
MCHC: 29.5 g/dL — ABNORMAL LOW (ref 30.0–36.0)
MCV: 99.4 fL (ref 80.0–100.0)
Platelets: 322 K/uL (ref 150–400)
RBC: 3.54 MIL/uL — ABNORMAL LOW (ref 3.87–5.11)
RDW: 15.3 % (ref 11.5–15.5)
WBC: 12.5 K/uL — ABNORMAL HIGH (ref 4.0–10.5)
nRBC: 0 % (ref 0.0–0.2)

## 2024-04-09 LAB — BASIC METABOLIC PANEL WITH GFR
Anion gap: 4 — ABNORMAL LOW (ref 5–15)
BUN: 15 mg/dL (ref 8–23)
CO2: 36 mmol/L — ABNORMAL HIGH (ref 22–32)
Calcium: 8.9 mg/dL (ref 8.9–10.3)
Chloride: 102 mmol/L (ref 98–111)
Creatinine, Ser: 0.63 mg/dL (ref 0.44–1.00)
GFR, Estimated: >60 mL/min (ref >60)
Glucose, Bld: 96 mg/dL (ref 70–99)
Potassium: 4 mmol/L (ref 3.5–5.1)
Sodium: 141 mmol/L (ref 135–145)

## 2024-04-09 LAB — MAGNESIUM: Magnesium: 2.2 mg/dL (ref 1.7–2.4)

## 2024-04-09 MED ORDER — IPRATROPIUM-ALBUTEROL 0.5-2.5 (3) MG/3ML IN SOLN
3.0000 mL | Freq: Three times a day (TID) | RESPIRATORY_TRACT | Status: DC
  Administered 2024-04-09 – 2024-04-10 (×3): 3 mL via RESPIRATORY_TRACT
  Filled 2024-04-09 (×3): qty 3

## 2024-04-09 NOTE — Progress Notes (Signed)
 PROGRESS NOTE    [name]  FMW:[id number] DOB: 05/23/1956 DOA: 04/07/2024 PCP: Maree Leni Edyth DELENA, MD    Brief Narrative:  68 year old known COPD on 2 L oxygen at home, smoker, sleep apnea on CPAP, hypertension, hyperlipidemia, GERD, anxiety and recent hospitalization 10/31-11/3 for COPD exacerbation came back to the hospital again with shortness of breath, chest pain and bilateral lower extremity edema.  Found to be 78% on 2 L oxygen on arrival with wheezing and tachypnea.  COVID influenza and RSV negative.  D-dimer negative.  Chest x-ray without any acute findings.  Placed on BiPAP, given Solu-Medrol  and nebulizers and admitted to the hospital.  Subjective: Patient was seen and examined.  She had just received oxycodone  and narcotics uncooperative.  Looks anxious.  Mild distress. Assessment & Plan:    COPD with acute exacerbation:. Monitor in the hospital of severity of symptoms. Aggressive bronchodilator therapy, IV steroids, inhalational steroids, scheduled and as needed bronchodilators, deep breathing exercises, incentive spirometry, chest physiotherapy. Antibiotics due to severity of symptoms. Supplemental oxygen to keep saturations more than 90%. Mobilize with PT OT. Recurrent admission, advanced COPD.  May benefit with palliation and hospice therapy at home.  Will consult palliative care team.  Chronic medical issues including OSA, CPAP at night Hypertension, on enalapril  stable Hyperlipidemia, on Lipitor stable Mood disorder, on mirtazapine  with stable Chronic pain syndrome, on Percocet.      DVT prophylaxis: enoxaparin  (LOVENOX ) injection 30 mg Start: 04/08/24 1600   Code Status: Full code Family Communication: None at the bedside Disposition Plan: Status is: Inpatient Remains inpatient appropriate because: Persistently symptomatic     Consultants:  Palliative care  Procedures:  None  Antimicrobials:  Doxycycline  11/12---     Objective: Vitals:    04/09/24 0245 04/09/24 0431 04/09/24 0819 04/09/24 0831  BP:  112/72 (!) 152/80   Pulse:  68 66 72  Resp: 18 17  (!) 22  Temp:  98.6 F (37 C)    TempSrc:  Oral    SpO2:  100%  99%  Weight:      Height:        Intake/Output Summary (Last 24 hours) at 04/09/2024 1302 Last data filed at 04/08/2024 1900 Gross per 24 hour  Intake 480 ml  Output --  Net 480 ml   Filed Weights   04/07/24 1217  Weight: 43.1 kg    Examination:  General exam: Appears slightly anxious.  Sleepy. Respiratory system: Clear to auscultation.  No added sounds. Cardiovascular system: S1 & S2 heard, RRR. SABRA Gastrointestinal system: Abdomen is nondistended, soft and nontender. No organomegaly or masses felt. Normal bowel sounds heard. Central nervous system: Alert and oriented. No focal neurological deficits. Extremities: Symmetric 5 x 5 power.    Data Reviewed: I have personally reviewed following labs and imaging studies  CBC: Recent Labs  Lab 04/07/24 1247 04/07/24 1917 04/09/24 0551  WBC 4.7  --  12.5*  HGB 10.2* 10.9* 10.4*  HCT 33.9* 32.0* 35.2*  MCV 96.3  --  99.4  PLT 310  --  322   Basic Metabolic Panel: Recent Labs  Lab 04/07/24 1247 04/07/24 1917 04/09/24 0551  NA 140 137 141  K 3.7 5.7* 4.0  CL 100  --  102  CO2 31  --  36*  GLUCOSE 98  --  96  BUN 8  --  15  CREATININE 0.62  --  0.63  CALCIUM  8.7*  --  8.9  MG  --   --  2.2   GFR: Estimated Creatinine Clearance: 44.5 mL/min (by C-G formula based on SCr of 0.63 mg/dL). Liver Function Tests: Recent Labs  Lab 04/07/24 1247  AST 19  ALT 13  ALKPHOS 27*  BILITOT 0.8  PROT 5.3*  ALBUMIN 3.4*   No results for input(s): LIPASE, AMYLASE in the last 168 hours. No results for input(s): AMMONIA in the last 168 hours. Coagulation Profile: No results for input(s): INR, PROTIME in the last 168 hours. Cardiac Enzymes: No results for input(s): CKTOTAL, CKMB, CKMBINDEX, TROPONINI in the last 168  hours. BNP (last 3 results) Recent Labs    02/26/24 0031  PROBNP 105.0   HbA1C: No results for input(s): HGBA1C in the last 72 hours. CBG: No results for input(s): GLUCAP in the last 168 hours. Lipid Profile: No results for input(s): CHOL, HDL, LDLCALC, TRIG, CHOLHDL, LDLDIRECT in the last 72 hours. Thyroid  Function Tests: No results for input(s): TSH, T4TOTAL, FREET4, T3FREE, THYROIDAB in the last 72 hours. Anemia Panel: No results for input(s): VITAMINB12, FOLATE, FERRITIN, TIBC, IRON, RETICCTPCT in the last 72 hours. Sepsis Labs: No results for input(s): PROCALCITON, LATICACIDVEN in the last 168 hours.  Recent Results (from the past 240 hours)  Resp panel by RT-PCR (RSV, Flu A&B, Covid) Anterior Nasal Swab     Status: None   Collection Time: 04/07/24  5:46 PM   Specimen: Anterior Nasal Swab  Result Value Ref Range Status   SARS Coronavirus 2 by RT PCR NEGATIVE NEGATIVE Final   Influenza A by PCR NEGATIVE NEGATIVE Final   Influenza B by PCR NEGATIVE NEGATIVE Final    Comment: (NOTE) The Xpert Xpress SARS-CoV-2/FLU/RSV plus assay is intended as an aid in the diagnosis of influenza from Nasopharyngeal swab specimens and should not be used as a sole basis for treatment. Nasal washings and aspirates are unacceptable for Xpert Xpress SARS-CoV-2/FLU/RSV testing.  Fact Sheet for Patients: bloggercourse.com  Fact Sheet for Healthcare Providers: seriousbroker.it  This test is not yet approved or cleared by the [place]  FDA and has been authorized for detection and/or diagnosis of SARS-CoV-2 by FDA under an Emergency Use Authorization (EUA). This EUA will remain in effect (meaning this test can be used) for the duration of the COVID-19 declaration under Section 564(b)(1) of the Act, 21 U.S.C. section 360bbb-3(b)(1), unless the authorization is terminated or revoked.     Resp  Syncytial Virus by PCR NEGATIVE NEGATIVE Final    Comment: (NOTE) Fact Sheet for Patients: bloggercourse.com  Fact Sheet for Healthcare Providers: seriousbroker.it  This test is not yet approved or cleared by the [place]  FDA and has been authorized for detection and/or diagnosis of SARS-CoV-2 by FDA under an Emergency Use Authorization (EUA). This EUA will remain in effect (meaning this test can be used) for the duration of the COVID-19 declaration under Section 564(b)(1) of the Act, 21 U.S.C. section 360bbb-3(b)(1), unless the authorization is terminated or revoked.  Performed at [facility], [address], [place], [place] [address]          Radiology Studies: No results found.       Scheduled Meds:  atorvastatin   10 mg Oral QHS   budesonide  (PULMICORT ) nebulizer solution  0.25 mg Nebulization BID   doxycycline   100 mg Oral Q12H   enalapril   10 mg Oral Daily   enoxaparin  (LOVENOX ) injection  30 mg Subcutaneous Q24H   guaiFENesin   600 mg Oral BID   ipratropium-albuterol   3 mL Nebulization TID   methylPREDNISolone  (SOLU-MEDROL )  injection  120 mg Intravenous Q24H   mirtazapine   7.5 mg Oral QHS   Continuous Infusions:   LOS: 1 day    Time spent: 51 minutes    Renato Applebaum, MD [facility]

## 2024-04-09 NOTE — TOC Initial Note (Signed)
 Transition of Care [facility]) - Initial/Assessment Note   Patient Details  Name: [name] MRN: [id number] Date of Birth: [birth year]  Transition of Care [facility]) CM/SW Contact:    Duwaine GORMAN Aran, LCSW Phone Number: 04/09/2024, 10:08 AM  Clinical Narrative: Patient is from home alone and is active with [facility]. Patient also has home oxygen (2L/min with a 5L concentrator) through [employer]. Care management following for discharge needs.  Expected Discharge Plan: Home w Home Health Services Barriers to Discharge: Continued Medical Work up  Expected Discharge Plan and Services In-house Referral: Clinical Social Work Living arrangements for the past 2 months: Apartment           DME Arranged: N/A DME Agency: NA HH Arranged: RN HH Agency: Other - See comment Producer, Television/film/video) Date HH Agency Contacted: 04/09/24 Time HH Agency Contacted: [phone] Representative spoke with at [facility] Agency: Jon  Prior Living Arrangements/Services Living arrangements for the past 2 months: Apartment Lives with:: Self Patient language and need for interpreter reviewed:: Yes Do you feel safe going back to the place where you live?: Yes      Need for Family Participation in Patient Care: No (Comment) Care giver support system in place?: Yes (comment) Current home services: DME, Home RN (Active with [facility] for [facility] Has home oxygen through [employer].) Criminal Activity/Legal Involvement Pertinent to Current Situation/Hospitalization: No - Comment as needed  Activities of Daily Living ADL Screening (condition at time of admission) Independently performs ADLs?: Yes (appropriate for developmental age) Is the patient deaf or have difficulty hearing?: No Does the patient have difficulty seeing, even when wearing glasses/contacts?: No Does the patient have difficulty concentrating, remembering, or making decisions?: No  Emotional Assessment Orientation: : Oriented to Self, Oriented to Place, Oriented to  Time, Oriented  to Situation Alcohol / Substance Use: Not Applicable Psych Involvement: No (comment)  Admission diagnosis:  Peripheral edema [R60.0] COPD exacerbation (HCC) [J44.1] Patient Active Problem List   Diagnosis Date Noted   HLD (hyperlipidemia) 03/28/2024   Chronic pain syndrome 03/28/2024   Chronic hypoxic respiratory failure (HCC) 12/02/2023   Continuous dependence on cigarette smoking 12/02/2023   Generalized anxiety disorder 12/02/2023   Acute exacerbation of chronic obstructive pulmonary disease (COPD) (HCC) 12/02/2023   Acute respiratory failure with hypoxia (HCC) 10/11/2023   Rectal bleeding 10/11/2023   Acute on chronic hypoxic respiratory failure (HCC) 09/06/2023   Wheezing 05/24/2023   COPD exacerbation (HCC) 05/22/2023   Precordial chest pain 03/31/2023   Centrilobular emphysema (HCC) 12/13/2022   Palliative care by specialist 10/10/2022   Goals of care, counseling/discussion 10/10/2022   Vocal cord dysfunction 10/08/2022   Anxiety 10/08/2022   Protein-calorie malnutrition, severe 10/04/2022   COPD with acute exacerbation (HCC) 10/02/2022   Grief 06/03/2022   Pain of left upper extremity 12/28/2016   Elevated TSH 11/08/2016   Gastroesophageal reflux disease 11/08/2016   Constipation 11/08/2016   COPD (chronic obstructive pulmonary disease) (HCC) 11/08/2016   Tobacco use 11/08/2016   Non-cardiac chest pain 11/07/2016   Unstable angina (HCC) 01/07/2016   HTN (hypertension) 01/07/2016   Hypokalemia 11/06/2013   Acute blood loss anemia 11/06/2013   Osteoarthritis of left hip 11/05/2013   History of total left hip replacement 11/05/2013   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 [place] [address] Phone: [phone] Fax: [phone]  [facility] [address] [place] [address] Phone: [phone] Fax: [phone]  Social Drivers of Health (SDOH) Social History: SDOH Screenings   Food Insecurity: No Food Insecurity (04/08/2024)  Housing: High Risk (04/08/2024)  Transportation Needs: No Transportation Needs (04/08/2024)  Utilities: Not At Risk (04/08/2024)  Depression (PHQ2-9): Low Risk  (10/05/2020)  Financial Resource Strain: Low Risk  (10/05/2020)  Social Connections: Socially Isolated (04/08/2024)  Stress: No Stress Concern Present (10/05/2020)  Tobacco Use: High Risk (04/07/2024)   SDOH Interventions:    Readmission Risk Interventions    04/09/2024   10:04 AM 12/04/2023    3:15 PM 10/04/2022    9:50 AM  Readmission Risk Prevention Plan  Post Dischage Appt   Complete  Medication Screening   Complete  Transportation Screening Complete Complete Complete  PCP or Specialist Appt within 3-5 Days  Complete   HRI or Home Care Consult  Complete   Social Work Consult for Recovery Care Planning/Counseling  Complete   Palliative Care Screening  Not Applicable   Medication Review Oceanographer) Complete Complete   HRI or Home Care Consult Complete    SW Recovery Care/Counseling Consult Complete    Palliative Care Screening Not Applicable    Skilled Nursing Facility Not Applicable

## 2024-04-10 DIAGNOSIS — J441 Chronic obstructive pulmonary disease with (acute) exacerbation: Secondary | ICD-10-CM | POA: Diagnosis not present

## 2024-04-10 MED ORDER — IPRATROPIUM-ALBUTEROL 0.5-2.5 (3) MG/3ML IN SOLN
3.0000 mL | Freq: Two times a day (BID) | RESPIRATORY_TRACT | Status: DC
  Administered 2024-04-10 – 2024-04-12 (×4): 3 mL via RESPIRATORY_TRACT
  Filled 2024-04-10 (×4): qty 3

## 2024-04-10 MED ORDER — SENNOSIDES-DOCUSATE SODIUM 8.6-50 MG PO TABS
1.0000 | ORAL_TABLET | Freq: Two times a day (BID) | ORAL | Status: DC
  Administered 2024-04-11 – 2024-04-12 (×3): 1 via ORAL
  Filled 2024-04-10 (×4): qty 1

## 2024-04-10 NOTE — Consult Note (Signed)
 Consultation Note Date: 04/10/2024   Patient Name: [name]  DOB: [birth year]  MRN: [id number]  Age / Sex: 69 y.o., female  PCP: Maree Leni Edyth DELENA, MD Referring Physician: Raenelle Coria, MD  Reason for Consultation: Establishing goals of care  HPI/Patient Profile: 67 y.o. female  with past medical history of   admitted on 04/07/2024 with  .   Clinical Assessment and Goals of Care: 68 year old lady with life-limiting illness of COPD, is on 2 L of oxygen via nasal cannula, history of smoking history of sleep apnea history of hypertension dyslipidemia GERD anxiety Recent hospitalization from October 31 through November 3 for COPD exacerbation Patient admitted again with shortness of breath chest discomfort and bilateral lower extremity edema found to have oxygen saturation of 78% on 2 L. Of note, patient has been seen and evaluated by palliative care in a previous hospitalization. Chart reviewed Patient seen and examined Patient states that she follows with pulmonology in the outpatient setting and in fact she has an appointment with them scheduled for next week.  She wishes to continue with full code full scope care.  Patient states that that she recently changed her apartments a few months ago and seems to believe that her respiratory symptoms have exacerbated since then.  Discussed about her chronic pain as well.  She follows at [facility] pain clinic, she remains on Percocet.  Palliative medicine is specialized medical care for people living with serious illness. It focuses on providing relief from the symptoms and stress of a serious illness. The goal is to improve quality of life for both the patient and the family. Goals of care: Broad aims of medical therapy in relation to the patient's values and preferences. Our aim is to provide medical care aimed at enabling patients to achieve the goals that matter  most to them, given the circumstances of their particular medical situation and their constraints.    Patient has been seen and evaluated by physical therapy, short-term rehab has been recommended however patient prefers to return home with home health services.  Recommend home-based palliative care.  NEXT OF KIN Patient has several grandchildren.  At present patient is decisional there is no established healthcare agent.  SUMMARY OF RECOMMENDATIONS   Full code full scope Continue Percocet pain Follow-up with pulmonary services in the outpatient setting. Recommend outpatient PET support Thank you for the consult  Code Status/Advance Care Planning: Full code   Symptom Management:     Palliative Prophylaxis:  Frequent Pain Assessment  Additional Recommendations (Limitations, Scope, Preferences): Full Scope Treatment  Psycho-social/Spiritual:  Desire for further Chaplaincy support:yes Additional Recommendations: Caregiving  Support/Resources  Prognosis:  Unable to determine  Discharge Planning: Home with Home Health      Primary Diagnoses: Present on Admission:  COPD exacerbation (HCC)  Acute on chronic hypoxic respiratory failure (HCC)  HLD (hyperlipidemia)   I have reviewed the medical record, interviewed the patient and family, and examined the patient. The following aspects are pertinent.  Past Medical History:  Diagnosis Date  Anxiety    Arthritis    Asthma    Chest pain 2006   Chest pain 10/2016   Chronic back pain    COPD (chronic obstructive pulmonary disease) (HCC)    Hypertension    Sciatica    Shortness of breath    due to  smoking    Social History   Socioeconomic History   Marital status: Widowed    Spouse name: Not on file   Number of children: Not on file   Years of education: Not on file   Highest education level: Not on file  Occupational History   Occupation: Retired  Tobacco Use   Smoking status: Some Days    Current  packs/day: 0.10    Average packs/day: 0.1 packs/day for 30.0 years (3.0 ttl pk-yrs)    Types: Cigarettes   Smokeless tobacco: Never   Tobacco comments:    Smoking since teenage years  Vaping Use   Vaping status: Never Used  Substance and Sexual Activity   Alcohol use: No   Drug use: No   Sexual activity: Never  Other Topics Concern   Not on file  Social History Narrative   Great-grandmother   Used to work as a corporate treasurer, now volunteers w/ free time at [employer]   Social Drivers of [employer] Strain: Low Risk  (10/05/2020)   Overall Financial Resource Strain (CARDIA)    Difficulty of Paying Living Expenses: Not hard at all  Food Insecurity: No Food Insecurity (04/08/2024)   Hunger Vital Sign    Worried About Running Out of Food in the Last Year: Never true    Ran Out of Food in the Last Year: Never true  Transportation Needs: No Transportation Needs (04/08/2024)   PRAPARE - Administrator, Civil Service (Medical): No    Lack of Transportation (Non-Medical): No  Physical Activity: Not on file  Stress: No Stress Concern Present (10/05/2020)   [employer] of Occupational Health - Occupational Stress Questionnaire    Feeling of Stress : Not at all  Social Connections: Socially Isolated (04/08/2024)   Social Connection and Isolation Panel    Frequency of Communication with Friends and Family: Once a week    Frequency of Social Gatherings with Friends and Family: Once a week    Attends Religious Services: Never    Database Administrator or Organizations: Yes    Attends Engineer, Structural: More than 4 times per year    Marital Status: Widowed   Family History  Problem Relation Age of Onset   Heart attack Mother 48   Hypertension Sister    Diabetes type II Sister    Tongue cancer Brother    Heart attack Sister 65   Lung cancer Sister    Hypertension Sister    Scheduled Meds:  atorvastatin   10 mg Oral QHS   budesonide   (PULMICORT ) nebulizer solution  0.25 mg Nebulization BID   doxycycline   100 mg Oral Q12H   enalapril   10 mg Oral Daily   enoxaparin  (LOVENOX ) injection  30 mg Subcutaneous Q24H   guaiFENesin   600 mg Oral BID   ipratropium-albuterol   3 mL Nebulization BID   methylPREDNISolone  (SOLU-MEDROL ) injection  120 mg Intravenous Q24H   mirtazapine   7.5 mg Oral QHS   Continuous Infusions: PRN Meds:.albuterol , benzonatate , LORazepam , naLOXone (NARCAN)  injection, oxyCODONE -acetaminophen  **AND** oxyCODONE  Medications Prior to Admission:  Prior to Admission medications   Medication Sig Start Date End Date Taking?  Authorizing Provider  albuterol  (PROVENTIL ) (2.5 MG/3ML) 0.083% nebulizer solution Take 2.5 mg by nebulization every 6 (six) hours as needed for wheezing or shortness of breath.   Yes [provider]  albuterol  (VENTOLIN  HFA) 108 (90 Base) MCG/ACT inhaler Inhale 2 puffs into the lungs every 4 (four) hours as needed for wheezing or shortness of breath. 07/24/23  Yes Dewald, Dorn NOVAK, MD  atorvastatin  (LIPITOR) 10 MG tablet Take 10 mg by mouth at bedtime.   Yes [provider]  benzonatate  (TESSALON ) 100 MG capsule Take 1 capsule (100 mg total) by mouth 3 (three) times daily as needed for cough. 03/30/24  Yes Raenelle Coria, MD  enalapril  (VASOTEC ) 10 MG tablet Take 1 tablet (10 mg total) by mouth daily. 01/21/24  Yes [name], MD  furosemide (LASIX) 20 MG tablet Take 20 mg by mouth daily as needed. 02/03/24  Yes [provider]  ipratropium-albuterol  (DUONEB) 0.5-2.5 (3) MG/3ML SOLN Take 3 mLs by nebulization every 6 (six) hours as needed. Patient taking differently: Take 3 mLs by nebulization 3 (three) times daily. 12/06/23  Yes Cherlyn Labella, MD  mirtazapine  (REMERON ) 7.5 MG tablet Take 7.5 mg by mouth at bedtime. 09/26/23  Yes [provider]  Multiple Vitamin (MULTIVITAMIN) capsule Take 1 capsule by mouth daily with breakfast.   Yes [provider]   oxyCODONE -acetaminophen  (PERCOCET) 10-325 MG tablet Take 1 tablet by mouth 5 (five) times daily as needed for severe pain (pain score 7-10).   Yes [provider]  OXYGEN Inhale 3 L/min into the lungs as needed (for shortness of breath).   Yes [provider]  polyethylene glycol (MIRALAX  / GLYCOLAX ) 17 g packet Take 17 g by mouth daily as needed for mild constipation. 05/27/23  Yes Tobie Yetta HERO, MD  Potassium 99 MG TABS Take 99 mg by mouth daily.   Yes [provider]  PRESCRIPTION MEDICATION See admin instructions. CPAP- At bedtime   Yes [provider]  busPIRone  (BUSPAR ) 7.5 MG tablet Take 7.5 mg by mouth 2 (two) times daily. Patient not taking: Reported on 04/07/2024 03/26/24   [provider]  famotidine  (PEPCID ) 40 MG tablet Take 40 mg by mouth daily as needed for heartburn or indigestion. Patient not taking: Reported on 04/07/2024    [provider]  fluticasone -salmeterol (ADVAIR  DISKUS) 250-50 MCG/ACT AEPB Inhale 1 puff into the lungs in the morning and at bedtime. Patient not taking: Reported on 04/07/2024 01/02/24   [name], MD  hydrOXYzine  (ATARAX ) 25 MG tablet Take 25 mg by mouth 3 (three) times daily as needed for anxiety. Patient not taking: Reported on 04/07/2024 09/25/23   [provider]   No Known Allergies Review of Systems Complains of chronic pain Physical Exam Weak appearing lady resting in bed No acute distress Does appear mildly anxious Able to speak in full sentences Appears chronically ill No peripheral edema Regular work of breathing  Vital Signs: BP 134/79 (BP Location: Right Arm)   Pulse 83   Temp 98.2 F (36.8 C) (Oral)   Resp 17   Ht 5' 1 (1.549 m)   Wt 43.1 kg   SpO2 98%   BMI 17.95 kg/m  Pain Scale: 0-10   Pain Score: 2    SpO2: SpO2: 98 % O2 Device:SpO2: 98 % O2 Flow Rate: .O2 Flow Rate (L/min): (S) 3 L/min (3-4 lpm dep)  IO: Intake/output summary:  Intake/Output  Summary (Last 24 hours) at 04/10/2024 1457 Last data filed at 04/09/2024 2300 Gross  per 24 hour  Intake 500 ml  Output --  Net 500 ml    LBM: Last BM Date : 03/30/24 (per pt) Baseline Weight: Weight: 43.1 kg Most recent weight: Weight: 43.1 kg     Palliative Assessment/Data:   50%  Time In: 1300 Time Out: 1415 Time Total: 75 Greater than 50%  of this time was spent counseling and coordinating care related to the above assessment and plan.  Signed by: Lonia Serve, MD   Please contact Palliative Medicine Team phone at [phone] for questions and concerns.  For individual provider: See Tracey

## 2024-04-10 NOTE — Evaluation (Signed)
 Physical Therapy Evaluation Patient Details Name: [name] MRN: [id number] DOB: [birth year] Today's Date: 04/10/2024  History of Present Illness  Pt is 68 yo female admitted with COPD exacerbation on 04/07/24.  Pt with hx including but not limited to COPD on home O2, OSA, HTN, HLD, GERD, anxiety, recent hospitalization 10/31-11/3  Clinical Impression  Pt admitted with above diagnosis. At baseline, pt independent and ambulatory with rollator.  Reports has been using 3 L O2 at home during day recently.  Reports limited assistance available at home and is mostly alone.  Today, pt with decreased safety awareness and impulsive movements particularly with O2 line.  She requiring min A for stability.  Did ambulated 6' but with min A for balance, fall risk, and frequent cues for safe RW use.  Pt was on 3 L with sats >96% during session.   Pt currently with functional limitations due to the deficits listed below (see PT Problem List). Pt will benefit from acute skilled PT to increase their independence and safety with mobility to allow discharge.  Due to fall risk, decreased mobility, and limited support Patient will benefit from continued inpatient follow up therapy, <3 hours/day at d/c.   O2 Sats were >96% on 3 L O2 rest and activity which pt reports is baseline home O2 level.           If plan is discharge home, recommend the following: A little help with walking and/or transfers;A little help with bathing/dressing/bathroom;Assistance with cooking/housework;Help with stairs or ramp for entrance   Can travel by private vehicle   Yes    Equipment Recommendations None recommended by PT  Recommendations for Other Services       Functional Status Assessment Patient has had a recent decline in their functional status and demonstrates the ability to make significant improvements in function in a reasonable and predictable amount of time.     Precautions / Restrictions Precautions Precautions:  Fall      Mobility  Bed Mobility Overal bed mobility: Needs Assistance Bed Mobility: Supine to Sit     Supine to sit: Supervision, HOB elevated, Used rails          Transfers Overall transfer level: Needs assistance Equipment used: None, Rolling walker (2 wheels) Transfers: Sit to/from Stand Sit to Stand: Min assist           General transfer comment: STS from bed, toilet, and chair requiring min A to stabilize at times.  Started without RW to go to bathroom.    Ambulation/Gait Ambulation/Gait assistance: Min assist Gait Distance (Feet): 75 Feet Assistive device: Rolling walker (2 wheels) Gait Pattern/deviations: Step-to pattern, Decreased stride length, Shuffle, Trunk flexed Gait velocity: .31 ft/sec Gait velocity interpretation: <1.8 ft/sec, indicate of risk for recurrent falls   General Gait Details: Pt moving very slow with hallway ambulation with RW but was impulsive and fast when trying to get to bathroom without RW.  In hallway, walker too far forward and to side, frequent cues, and standing rest breaks.  Stairs            Wheelchair Mobility     Tilt Bed    Modified Rankin (Stroke Patients Only)       Balance Overall balance assessment: Needs assistance Sitting-balance support: No upper extremity supported Sitting balance-Leahy Scale: Good     Standing balance support: Bilateral upper extremity supported, Single extremity supported Standing balance-Leahy Scale: Poor Standing balance comment: Needs UE support.  When walkign to bathroom without AD -reaching  for bed and walls                             Pertinent Vitals/Pain Pain Assessment Pain Assessment: No/denies pain    Home Living Family/patient expects to be discharged to:: Private residence Living Arrangements: Alone Available Help at Discharge: Family;Available PRN/intermittently (reports limited assistance) Type of Home: Apartment Home Access: Level entry        Home Layout: One level Home Equipment: Cane - single point;Rollator (4 wheels) Additional Comments: Reports 3 L O2 day and 2 L O2 night    Prior Function Prior Level of Function : Independent/Modified Independent             Mobility Comments: Using Rollator lately, Could ambulate in community ADLs Comments: Reports independent adls and light iadls     Extremity/Trunk Assessment   Upper Extremity Assessment Upper Extremity Assessment: Defer to OT evaluation    Lower Extremity Assessment Lower Extremity Assessment: Generalized weakness    Cervical / Trunk Assessment Cervical / Trunk Assessment: Normal  Communication        Cognition Arousal: Alert Behavior During Therapy: Impulsive   PT - Cognitive impairments: Safety/Judgement                       PT - Cognition Comments: Easily distracted , cues to redirect, decreased insight to deficits/safety (moves quickly at times, reaching down to clean floor, impulsive with lines/leads)         Cueing       General Comments General comments (skin integrity, edema, etc.): Pt on 3 L O2 with sats >96%.    Exercises     Assessment/Plan    PT Assessment Patient needs continued PT services  PT Problem List Decreased strength;Decreased balance;Decreased mobility;Decreased activity tolerance;Decreased knowledge of use of DME;Decreased safety awareness;Cardiopulmonary status limiting activity       PT Treatment Interventions DME instruction;Gait training;Functional mobility training;Therapeutic activities;Therapeutic exercise;Balance training;Patient/family education;Cognitive remediation    PT Goals (Current goals can be found in the Care Plan section)  Acute Rehab PT Goals Patient Stated Goal: return home PT Goal Formulation: With patient Time For Goal Achievement: 04/24/24 Potential to Achieve Goals: Good    Frequency Min 3X/week     Co-evaluation               AM-PAC PT 6 Clicks Mobility   Outcome Measure Help needed turning from your back to your side while in a flat bed without using bedrails?: None Help needed moving from lying on your back to sitting on the side of a flat bed without using bedrails?: A Little Help needed moving to and from a bed to a chair (including a wheelchair)?: A Little Help needed standing up from a chair using your arms (e.g., wheelchair or bedside chair)?: A Little Help needed to walk in hospital room?: A Lot (mod cues) Help needed climbing 3-5 steps with a railing? : A Lot 6 Click Score: 17    End of Session Equipment Utilized During Treatment: Gait belt;Oxygen Activity Tolerance: Patient tolerated treatment well Patient left: with chair alarm set;in chair;with call bell/phone within reach;with nursing/sitter in room (noted green light on alarm and plugged in nurse call) Nurse Communication: Mobility status PT Visit Diagnosis: Muscle weakness (generalized) (M62.81);Other abnormalities of gait and mobility (R26.89)    Time: 8956-8876 PT Time Calculation (min) (ACUTE ONLY): 40 min   Charges:   PT Evaluation $PT Eval  Low Complexity: 1 Low PT Treatments $Gait Training: 8-22 mins $Therapeutic Activity: 8-22 mins PT General Charges $$ ACUTE PT VISIT: 1 Visit         Benjiman, PT Acute Rehab [facility] [phone]   Benjiman VEAR Mulberry 04/10/2024, 11:50 AM

## 2024-04-10 NOTE — TOC Progression Note (Signed)
 Transition of Care [facility]) - Progression Note   Patient Details  Name: [name] MRN: [id number] Date of Birth: Feb 08, 1956  Transition of [facility] CM/SW Contact  Duwaine GORMAN Aran, LCSW Phone Number: 04/10/2024, 1:12 PM  Clinical Narrative: PT evaluation recommended SNF, but patient politely declined and prefers to return home with [facility]. Patient reported she does not have a travel tank, so CSW ordered travel tank through [place] with [employer] to have it delivered to patient's room. Palliative consulted. Care management to follow.  Expected Discharge Plan: Home w Home Health Services Barriers to Discharge: Continued Medical Work up  Expected Discharge Plan and Services In-house Referral: Clinical Social Work Living arrangements for the past 2 months: Apartment             DME Arranged: N/A DME Agency: NA HH Arranged: RN, PT, OT HH Agency: Other - See comment Producer, Television/film/video) Date HH Agency Contacted: 04/09/24 Time HH Agency Contacted: [phone] Representative spoke with at [facility] Agency: Jon  Social Drivers of Health (SDOH) Interventions SDOH Screenings   Food Insecurity: No Food Insecurity (04/08/2024)  Housing: High Risk (04/08/2024)  Transportation Needs: No Transportation Needs (04/08/2024)  Utilities: Not At Risk (04/08/2024)  Depression (PHQ2-9): Low Risk  (10/05/2020)  Financial Resource Strain: Low Risk  (10/05/2020)  Social Connections: Socially Isolated (04/08/2024)  Stress: No Stress Concern Present (10/05/2020)  Tobacco Use: High Risk (04/07/2024)   Readmission Risk Interventions    04/09/2024   10:04 AM 12/04/2023    3:15 PM 10/04/2022    9:50 AM  Readmission Risk Prevention Plan  Post Dischage Appt   Complete  Medication Screening   Complete  Transportation Screening Complete Complete Complete  PCP or Specialist Appt within 3-5 Days  Complete   HRI or Home Care Consult  Complete   Social Work Consult for Recovery Care Planning/Counseling  Complete   Palliative Care Screening   Not Applicable   Medication Review Oceanographer) Complete Complete   HRI or Home Care Consult Complete    SW Recovery Care/Counseling Consult Complete    Palliative Care Screening Not Applicable    Skilled Nursing Facility Not Applicable

## 2024-04-10 NOTE — Progress Notes (Signed)
 PROGRESS NOTE    [name]  FMW:[id number] DOB: November 13, 1955 DOA: 04/07/2024 PCP: Regina Leni Edyth DELENA, Arnold    Brief Narrative:  68 year old known COPD on 2 L oxygen at home, smoker, sleep apnea on CPAP, hypertension, hyperlipidemia, GERD, anxiety and recent hospitalization 10/31-11/3 for COPD exacerbation came back to the hospital again with shortness of breath, chest pain and bilateral lower extremity edema.  Found to be 78% on 2 L oxygen on arrival with wheezing and tachypnea.  COVID influenza and RSV negative.  D-dimer negative.  Chest x-ray without any acute findings.  Placed on BiPAP, given Solu-Medrol  and nebulizers and admitted to the hospital.  Subjective: Patient seen and examined.  Looks anxious.  She tells me that she feels slightly better today.  Assessment & Plan:    COPD with acute exacerbation:. Still has symptoms. Aggressive bronchodilator therapy, IV steroids, inhalational steroids, scheduled and as needed bronchodilators, deep breathing exercises, incentive spirometry, chest physiotherapy. Antibiotics due to severity of symptoms. Supplemental oxygen to keep saturations more than 90%. Mobilize with PT OT. Recurrent admission, advanced COPD.  May benefit with palliation and hospice therapy at home.  Palliative care consulted.  Chronic medical issues including OSA, CPAP at night.  She is in the process of getting a BiPAP. Hypertension, on enalapril  stable Hyperlipidemia, on Lipitor stable Mood disorder, on mirtazapine  with stable Chronic pain syndrome, on Percocet.      DVT prophylaxis: enoxaparin  (LOVENOX ) injection 30 mg Start: 04/08/24 1600   Code Status: Full code Family Communication: None at the bedside Disposition Plan: Status is: Inpatient Remains inpatient appropriate because: Persistently symptomatic     Consultants:  Palliative care  Procedures:  None  Antimicrobials:  Doxycycline  11/12---     Objective: Vitals:   04/10/24 0001  04/10/24 0535 04/10/24 0906 04/10/24 1236  BP:  124/84  134/79  Pulse:  61  83  Resp: 17 20  17   Temp:  98.4 F (36.9 C)  98.2 F (36.8 C)  TempSrc:  Axillary  Oral  SpO2:  96% 97% 98%  Weight:      Height:        Intake/Output Summary (Last 24 hours) at 04/10/2024 1428 Last data filed at 04/09/2024 2300 Gross per 24 hour  Intake 500 ml  Output --  Net 500 ml   Filed Weights   04/07/24 1217  Weight: 43.1 kg    Examination:  General exam: Chronically sick looking.  Frail.  Debilitated. Respiratory system: Clear to auscultation.  No added sounds. Cardiovascular system: S1 & S2 heard, RRR. SABRA Gastrointestinal system: Abdomen is nondistended, soft and nontender. No organomegaly or masses felt. Normal bowel sounds heard. Central nervous system: Alert and oriented. No focal neurological deficits. Extremities: Symmetric 5 x 5 power.    Data Reviewed: I have personally reviewed following labs and imaging studies  CBC: Recent Labs  Lab 04/07/24 1247 04/07/24 1917 04/09/24 0551  WBC 4.7  --  12.5*  HGB 10.2* 10.9* 10.4*  HCT 33.9* 32.0* 35.2*  MCV 96.3  --  99.4  PLT 310  --  322   Basic Metabolic Panel: Recent Labs  Lab 04/07/24 1247 04/07/24 1917 04/09/24 0551  NA 140 137 141  K 3.7 5.7* 4.0  CL 100  --  102  CO2 31  --  36*  GLUCOSE 98  --  96  BUN 8  --  15  CREATININE 0.62  --  0.63  CALCIUM  8.7*  --  8.9  MG  --   --  2.2   GFR: Estimated Creatinine Clearance: 44.5 mL/min (by C-G formula based on SCr of 0.63 mg/dL). Liver Function Tests: Recent Labs  Lab 04/07/24 1247  AST 19  ALT 13  ALKPHOS 27*  BILITOT 0.8  PROT 5.3*  ALBUMIN 3.4*   No results for input(s): LIPASE, AMYLASE in the last 168 hours. No results for input(s): AMMONIA in the last 168 hours. Coagulation Profile: No results for input(s): INR, PROTIME in the last 168 hours. Cardiac Enzymes: No results for input(s): CKTOTAL, CKMB, CKMBINDEX, TROPONINI in the  last 168 hours. BNP (last 3 results) Recent Labs    02/26/24 0031  PROBNP 105.0   HbA1C: No results for input(s): HGBA1C in the last 72 hours. CBG: No results for input(s): GLUCAP in the last 168 hours. Lipid Profile: No results for input(s): CHOL, HDL, LDLCALC, TRIG, CHOLHDL, LDLDIRECT in the last 72 hours. Thyroid  Function Tests: No results for input(s): TSH, T4TOTAL, FREET4, T3FREE, THYROIDAB in the last 72 hours. Anemia Panel: No results for input(s): VITAMINB12, FOLATE, FERRITIN, TIBC, IRON, RETICCTPCT in the last 72 hours. Sepsis Labs: No results for input(s): PROCALCITON, LATICACIDVEN in the last 168 hours.  Recent Results (from the past 240 hours)  Resp panel by RT-PCR (RSV, Flu A&B, Covid) Anterior Nasal Swab     Status: None   Collection Time: 04/07/24  5:46 PM   Specimen: Anterior Nasal Swab  Result Value Ref Range Status   SARS Coronavirus 2 by RT PCR NEGATIVE NEGATIVE Final   Influenza A by PCR NEGATIVE NEGATIVE Final   Influenza B by PCR NEGATIVE NEGATIVE Final    Comment: (NOTE) The Xpert Xpress SARS-CoV-2/FLU/RSV plus assay is intended as an aid in the diagnosis of influenza from Nasopharyngeal swab specimens and should not be used as a sole basis for treatment. Nasal washings and aspirates are unacceptable for Xpert Xpress SARS-CoV-2/FLU/RSV testing.  Fact Sheet for Patients: bloggercourse.com  Fact Sheet for Healthcare Providers: seriousbroker.it  This test is not yet approved or cleared by the [place]  FDA and has been authorized for detection and/or diagnosis of SARS-CoV-2 by FDA under an Emergency Use Authorization (EUA). This EUA will remain in effect (meaning this test can be used) for the duration of the COVID-19 declaration under Section 564(b)(1) of the Act, 21 U.S.C. section 360bbb-3(b)(1), unless the authorization is terminated or revoked.      Resp Syncytial Virus by PCR NEGATIVE NEGATIVE Final    Comment: (NOTE) Fact Sheet for Patients: bloggercourse.com  Fact Sheet for Healthcare Providers: seriousbroker.it  This test is not yet approved or cleared by the [place]  FDA and has been authorized for detection and/or diagnosis of SARS-CoV-2 by FDA under an Emergency Use Authorization (EUA). This EUA will remain in effect (meaning this test can be used) for the duration of the COVID-19 declaration under Section 564(b)(1) of the Act, 21 U.S.C. section 360bbb-3(b)(1), unless the authorization is terminated or revoked.  Performed at [facility], [address], [place], [place] [address]          Radiology Studies: No results found.       Scheduled Meds:  atorvastatin   10 mg Oral QHS   budesonide  (PULMICORT ) nebulizer solution  0.25 mg Nebulization BID   doxycycline   100 mg Oral Q12H   enalapril   10 mg Oral Daily   enoxaparin  (LOVENOX ) injection  30 mg Subcutaneous Q24H   guaiFENesin   600 mg Oral BID   ipratropium-albuterol   3 mL Nebulization BID   methylPREDNISolone  (SOLU-MEDROL )  injection  120 mg Intravenous Q24H   mirtazapine   7.5 mg Oral QHS   Continuous Infusions:   LOS: 2 days    Time spent: 51 minutes    Renato Applebaum, Arnold [facility]

## 2024-04-10 NOTE — Progress Notes (Signed)
   04/10/24 0001  BiPAP/CPAP/SIPAP  $ Non-Invasive Home Ventilator  Subsequent  BiPAP/CPAP/SIPAP Pt Type Adult  BiPAP/CPAP/SIPAP DREAMSTATIOND  Mask Type Full face mask  Dentures removed? Not applicable  Mask Size Medium  FiO2 (%) 21 %  Patient Home Machine No  Patient Home Mask No  Patient Home Tubing No  Auto Titrate Yes  Minimum cmH2O 5 cmH2O  Maximum cmH2O 20 cmH2O  CPAP/SIPAP surface wiped down Yes  Device Plugged into RED Power Outlet Yes  BiPAP/CPAP /SiPAP Vitals  Resp 17  MEWS Score/Color  MEWS Score 1  MEWS Score Color Green

## 2024-04-10 NOTE — Evaluation (Signed)
 Occupational Therapy Evaluation Patient Details Name: [name] MRN: [id number] DOB: 27-Dec-1955 Today's Date: 04/10/2024   History of Present Illness   Pt is 68 yo female admitted with COPD exacerbation on 04/07/24.  Pt with hx including but not limited to COPD on home O2, OSA, HTN, HLD, GERD, anxiety, recent hospitalization 10/31-11/3     Clinical Impressions Pt admitted with above. Prior to admission, pt lives alone and was MOD I for ADL performance using a rollator for mobility. Reports using 3L O2 during the day and 2L at night. Pt presents with decreased safety awareness, impulsivity, agitated with low frustration tolerance, is non-compliant with O2 use during session and dismissive of all therapeutic efforts to direct, redirect or provide education for safety, O2 line mgmt, energy conservation or ADL performance. Pt standing with O2 line around ankles, CGA to prevent fall, and ambulates to bathroom, unaware of O2 line and sits on commode sideways, frequent cues for safety. Did mobilize 200 ft in hallway, pt bumping into items using RW and unaware, makes no effort to self-correct. SpO2 95%> on 3L, pt self-selects to doff O2 during mobility and is non-compliant when asked to don despite wheezing and increased WOB noted. Session ended with pt refusing to comply with fall precautions or O2 use, charge RN notified. OT will attempt 1 additional session to see if pt is open to therapeutic intervention. Pt would benefit from skilled OT services to address noted impairments and functional limitations (see below for any additional details) in order to maximize safety and independence while minimizing falls risk and caregiver burden. Anticipate the need for follow up [facility] OT services upon acute hospital DC.     If plan is discharge home, recommend the following:   A little help with walking and/or transfers;A little help with bathing/dressing/bathroom;Assistance with cooking/housework      Functional Status Assessment   Patient has had a recent decline in their functional status and demonstrates the ability to make significant improvements in function in a reasonable and predictable amount of time.     Equipment Recommendations   None recommended by OT      Precautions/Restrictions   Precautions Precautions: Fall Recall of Precautions/Restrictions: Impaired Restrictions Weight Bearing Restrictions Per Provider Order: No     Mobility Bed Mobility Overal bed mobility: Modified Independent                  Transfers Overall transfer level: Needs assistance Equipment used: None, Rolling walker (2 wheels) Transfers: Sit to/from Stand Sit to Stand: Supervision           General transfer comment: supervision for multiple STS transfers, pt resistiant to physical assistance, unsafe with O2 line caught around ankles, stands from commode with pants around knees      Balance Overall balance assessment: Needs assistance Sitting-balance support: No upper extremity supported Sitting balance-Leahy Scale: Good     Standing balance support: Bilateral upper extremity supported, Single extremity supported Standing balance-Leahy Scale: Poor Standing balance comment: walked to bathroom without AD, reaching for wall / furniture support                           ADL either performed or assessed with clinical judgement   ADL Overall ADL's : Needs assistance/impaired     Grooming: Wash/dry hands;Standing;Supervision/safety Grooming Details (indicate cue type and reason): cues for safety awareness Upper Body Bathing: Sitting;Supervision/ safety   Lower Body Bathing: Supervison/ safety;Sit to/from stand  Upper Body Dressing : Set up;Sitting   Lower Body Dressing: Supervision/safety;Sit to/from stand   Toilet Transfer: Supervision/safety;Ambulation;Regular Teacher, Adult Education Details (indicate cue type and reason): cues for safety  awareness, pt ignoring O2 line and often getting tangled, unaware and not open to redirection from OT. pt sits spontanously on side of commode. Toileting- Clothing Manipulation and Hygiene: Supervision/safety;Sit to/from stand Toileting - Clothing Manipulation Details (indicate cue type and reason): pt does not manage clothing sufficiently, gown falling into toilet despite cues to redirect     Functional mobility during ADLs: Supervision/safety;Cueing for sequencing;Cueing for safety;Rolling walker (2 wheels) General ADL Comments: decreased activity tolerance, pt removing O2 with wheezing noted, refusing to re-don. pt dismissive of all attempts to edu and redirect     Vision Baseline Vision/History: 0 No visual deficits Ability to See in Adequate Light: 0 Adequate       Perception         Praxis         Pertinent Vitals/Pain Pain Assessment Pain Assessment: No/denies pain     Extremity/Trunk Assessment Upper Extremity Assessment Upper Extremity Assessment: Generalized weakness   Lower Extremity Assessment Lower Extremity Assessment: Generalized weakness   Cervical / Trunk Assessment Cervical / Trunk Assessment: Normal   Communication Communication Communication: No apparent difficulties   Cognition Arousal: Alert Behavior During Therapy: Agitated, Impulsive Cognition: Cognition impaired     Awareness: Intellectual awareness intact     Executive functioning impairment (select all impairments): Problem solving, Reasoning OT - Cognition Comments: pt argumentitive, lacks insight into deficits with poor safety awareness. pt largely non-compliant with O2 wearing, doffing [place] despite audible wheezing, dismissive of OT's edu/instruction.                 Following commands: Impaired Following commands impaired: Follows one step commands with increased time (pt demos ability to follow commands, but self-selects to ignore.)     Cueing  General Comments   Cueing  Techniques: Verbal cues  Pt on 3L O2 - states her baseline but later states she only wears O2 sometimes   Exercises Exercises: Other exercises Other Exercises Other Exercises: Pt demonstrates decreased knowlege of SpO2 reading, pulse ox used and pt reading 78% O2, that's good!, dismissive of OT's edu re: O2 levels and HR with exertion Other Exercises: pt agitated, argumentitive and dismissive of all therapeutic attempts to engage/build rapport and provide edu throughout session. session ended with pt refusing to comply with returning to bed for bed alarm activation and remaining non-compliant with O2 use throughout session. Consulting Civil Engineer notified. Pt left standing in room folding clothes, O2 doffed, audible wheezing present. Door open.   Shoulder Instructions      Home Living Family/patient expects to be discharged to:: Private residence Living Arrangements: Alone Available Help at Discharge: Family;Available PRN/intermittently Type of Home: Apartment Home Access: Level entry     Home Layout: One level     Bathroom Shower/Tub: Chief Strategy Officer: Standard     Home Equipment: Cane - single point;Rollator (4 wheels)   Additional Comments: Reports 3 L O2 day and 2 L O2 night      Prior Functioning/Environment Prior Level of Function : Independent/Modified Independent             Mobility Comments: Using Rollator lately, Could ambulate in community ADLs Comments: Reports independent ADLs and IADLs    OT Problem List: Decreased activity tolerance   OT Treatment/Interventions: Self-care/ADL training;Therapeutic exercise;Energy conservation;DME and/or AE instruction;Therapeutic activities;Patient/family education;Balance  training      OT Goals(Current goals can be found in the care plan section)   Acute Rehab OT Goals OT Goal Formulation: With patient Time For Goal Achievement: 04/24/24 Potential to Achieve Goals: Fair   OT Frequency:  Min 2X/week        AM-PAC OT 6 Clicks Daily Activity     Outcome Measure Help from another person eating meals?: None Help from another person taking care of personal grooming?: None Help from another person toileting, which includes using toliet, bedpan, or urinal?: A Little Help from another person bathing (including washing, rinsing, drying)?: A Little Help from another person to put on and taking off regular upper body clothing?: A Little Help from another person to put on and taking off regular lower body clothing?: A Little 6 Click Score: 20   End of Session Equipment Utilized During Treatment: Oxygen;Rolling walker (2 wheels) Nurse Communication: Mobility status  Activity Tolerance: Treatment limited secondary to agitation (limited by pt non-compliance and agitation) Patient left: Other (comment);with call bell/phone within reach (standing in room folding laundry, pt refusing to return to bed or don O2, dismissing OT direction. Alerted charge RN who states pt has been independent in room.)  OT Visit Diagnosis: Muscle weakness (generalized) (M62.81)                Time: 8561-8495 OT Time Calculation (min): 26 min Charges:  OT General Charges $OT Visit: 1 Visit OT Evaluation $OT Eval Low Complexity: 1 Low OT Treatments $Self Care/Home Management : 8-22 mins  Toney Lizaola L. Jaine Estabrooks, OTR/L  04/10/24, 3:48 PM

## 2024-04-10 NOTE — Progress Notes (Signed)
   04/10/24 2230  BiPAP/CPAP/SIPAP  BiPAP/CPAP/SIPAP Pt Type Adult  BiPAP/CPAP/SIPAP DREAMSTATIOND  Mask Type Full face mask  Dentures removed? Not applicable  Mask Size Medium  Flow Rate 3 lpm  Patient Home Machine No  Patient Home Mask No  Patient Home Tubing No  Auto Titrate Yes  Minimum cmH2O 5 cmH2O  Maximum cmH2O 20 cmH2O  CPAP/SIPAP surface wiped down Yes  Device Plugged into RED Power Outlet Yes  BiPAP/CPAP /SiPAP Vitals  Pulse Rate 63  Resp 18  SpO2 99 %  Bilateral Breath Sounds Diminished  MEWS Score/Color  MEWS Score 1  MEWS Score Color [name]

## 2024-04-11 DIAGNOSIS — J441 Chronic obstructive pulmonary disease with (acute) exacerbation: Secondary | ICD-10-CM | POA: Diagnosis not present

## 2024-04-11 MED ORDER — ALUM & MAG HYDROXIDE-SIMETH 200-200-20 MG/5ML PO SUSP
30.0000 mL | Freq: Once | ORAL | Status: AC
  Administered 2024-04-11: 30 mL via ORAL
  Filled 2024-04-11: qty 30

## 2024-04-11 NOTE — Progress Notes (Signed)
 PROGRESS NOTE    [name]  FMW:[id number] DOB: [birth year] DOA: 04/07/2024 PCP: Maree Leni Edyth DELENA, MD    Brief Narrative:  68 year old known COPD on 2 L oxygen at home, smoker, sleep apnea on CPAP, hypertension, hyperlipidemia, GERD, anxiety and recent hospitalization 10/31-11/3 for COPD exacerbation came back to the hospital again with shortness of breath, chest pain and bilateral lower extremity edema.  Found to be 78% on 2 L oxygen on arrival with wheezing and tachypnea.  COVID influenza and RSV negative.  D-dimer negative.  Chest x-ray without any acute findings.  Placed on BiPAP, given Solu-Medrol  and nebulizers and admitted to the hospital.  Subjective: Patient seen and examined.  She tells me she feels slightly better however is still significantly wheezing.  Nervous about going home.  Found to have significantly wheezing, staying in the hospital today.  Assessment & Plan:    COPD with acute exacerbation:. Chills symptomatic.  Continue with bronchodilator therapy, IV steroids, inhalational steroids, scheduled and as needed bronchodilators, deep breathing exercises, incentive spirometry, chest physiotherapy. Antibiotics due to severity of symptoms.  On doxycycline .  Will continue. Supplemental oxygen to keep saturations more than 90%. Mobilize with PT OT. Recurrent admission, advanced COPD.  Palliative consulted.  Patient opted to have ongoing full scope of treatment. Once ready for discharge, we will discharge with prolonged steroid taper.  Chronic medical issues including OSA, CPAP at night.  She is in the process of getting a BiPAP. Will defer to her Pulmonology.  Hypertension, on enalapril  stable. Hyperlipidemia, on Lipitor stable. Mood disorder, on mirtazapine  with stable. Chronic pain syndrome, on Percocet.. continue , using some ativan  for acute phase anxiety.       DVT prophylaxis: enoxaparin  (LOVENOX ) injection 30 mg Start: 04/08/24 1600   Code Status: Full  code Family Communication: None at the bedside Disposition Plan: Status is: Inpatient Remains inpatient appropriate because: Persistently symptomatic     Consultants:  Palliative care  Procedures:  None  Antimicrobials:  Doxycycline  11/12---     Objective: Vitals:   04/10/24 2230 04/11/24 0425 04/11/24 1057 04/11/24 1059  BP:  126/79    Pulse: 63 61    Resp: 18 16    Temp:  98 F (36.7 C)    TempSrc:      SpO2: 99% 100% 99% 99%  Weight:      Height:        Intake/Output Summary (Last 24 hours) at 04/11/2024 1111 Last data filed at 04/11/2024 0900 Gross per 24 hour  Intake 740 ml  Output 1050 ml  Net -310 ml   Filed Weights   04/07/24 1217  Weight: 43.1 kg    Examination:  General exam: Thin and cachectic.  Frail and debilitated.  Able to talk in complete sentences with some dyspnea on talking. Respiratory system: Extensive bilateral expiratory wheezing.  No accessory muscle use. Cardiovascular system: S1 & S2 heard, RRR. SABRA Gastrointestinal system: Abdomen is nondistended, soft and nontender. No organomegaly or masses felt. Normal bowel sounds heard. Central nervous system: Alert and oriented. No focal neurological deficits. Extremities: Symmetric 5 x 5 power.    Data Reviewed: I have personally reviewed following labs and imaging studies  CBC: Recent Labs  Lab 04/07/24 1247 04/07/24 1917 04/09/24 0551  WBC 4.7  --  12.5*  HGB 10.2* 10.9* 10.4*  HCT 33.9* 32.0* 35.2*  MCV 96.3  --  99.4  PLT 310  --  322   Basic Metabolic Panel: Recent Labs  Lab 04/07/24  1247 04/07/24 1917 04/09/24 0551  NA 140 137 141  K 3.7 5.7* 4.0  CL 100  --  102  CO2 31  --  36*  GLUCOSE 98  --  96  BUN 8  --  15  CREATININE 0.62  --  0.63  CALCIUM  8.7*  --  8.9  MG  --   --  2.2   GFR: Estimated Creatinine Clearance: 44.5 mL/min (by C-G formula based on SCr of 0.63 mg/dL). Liver Function Tests: Recent Labs  Lab 04/07/24 1247  AST 19  ALT 13  ALKPHOS  27*  BILITOT 0.8  PROT 5.3*  ALBUMIN 3.4*   No results for input(s): LIPASE, AMYLASE in the last 168 hours. No results for input(s): AMMONIA in the last 168 hours. Coagulation Profile: No results for input(s): INR, PROTIME in the last 168 hours. Cardiac Enzymes: No results for input(s): CKTOTAL, CKMB, CKMBINDEX, TROPONINI in the last 168 hours. BNP (last 3 results) Recent Labs    02/26/24 0031  PROBNP 105.0   HbA1C: No results for input(s): HGBA1C in the last 72 hours. CBG: No results for input(s): GLUCAP in the last 168 hours. Lipid Profile: No results for input(s): CHOL, HDL, LDLCALC, TRIG, CHOLHDL, LDLDIRECT in the last 72 hours. Thyroid  Function Tests: No results for input(s): TSH, T4TOTAL, FREET4, T3FREE, THYROIDAB in the last 72 hours. Anemia Panel: No results for input(s): VITAMINB12, FOLATE, FERRITIN, TIBC, IRON, RETICCTPCT in the last 72 hours. Sepsis Labs: No results for input(s): PROCALCITON, LATICACIDVEN in the last 168 hours.  Recent Results (from the past 240 hours)  Resp panel by RT-PCR (RSV, Flu A&B, Covid) Anterior Nasal Swab     Status: None   Collection Time: 04/07/24  5:46 PM   Specimen: Anterior Nasal Swab  Result Value Ref Range Status   SARS Coronavirus 2 by RT PCR NEGATIVE NEGATIVE Final   Influenza A by PCR NEGATIVE NEGATIVE Final   Influenza B by PCR NEGATIVE NEGATIVE Final    Comment: (NOTE) The Xpert Xpress SARS-CoV-2/FLU/RSV plus assay is intended as an aid in the diagnosis of influenza from Nasopharyngeal swab specimens and should not be used as a sole basis for treatment. Nasal washings and aspirates are unacceptable for Xpert Xpress SARS-CoV-2/FLU/RSV testing.  Fact Sheet for Patients: bloggercourse.com  Fact Sheet for Healthcare Providers: seriousbroker.it  This test is not yet approved or cleared by the [place]  FDA  and has been authorized for detection and/or diagnosis of SARS-CoV-2 by FDA under an Emergency Use Authorization (EUA). This EUA will remain in effect (meaning this test can be used) for the duration of the COVID-19 declaration under Section 564(b)(1) of the Act, 21 U.S.C. section 360bbb-3(b)(1), unless the authorization is terminated or revoked.     Resp Syncytial Virus by PCR NEGATIVE NEGATIVE Final    Comment: (NOTE) Fact Sheet for Patients: bloggercourse.com  Fact Sheet for Healthcare Providers: seriousbroker.it  This test is not yet approved or cleared by the [place]  FDA and has been authorized for detection and/or diagnosis of SARS-CoV-2 by FDA under an Emergency Use Authorization (EUA). This EUA will remain in effect (meaning this test can be used) for the duration of the COVID-19 declaration under Section 564(b)(1) of the Act, 21 U.S.C. section 360bbb-3(b)(1), unless the authorization is terminated or revoked.  Performed at [facility], [address], [place], [place] [address]          Radiology Studies: No results found.       Scheduled  Meds:  atorvastatin   10 mg Oral QHS   budesonide  (PULMICORT ) nebulizer solution  0.25 mg Nebulization BID   doxycycline   100 mg Oral Q12H   enalapril   10 mg Oral Daily   enoxaparin  (LOVENOX ) injection  30 mg Subcutaneous Q24H   guaiFENesin   600 mg Oral BID   ipratropium-albuterol   3 mL Nebulization BID   methylPREDNISolone  (SOLU-MEDROL ) injection  120 mg Intravenous Q24H   mirtazapine   7.5 mg Oral QHS   senna-docusate  1 tablet Oral BID   Continuous Infusions:   LOS: 3 days    Time spent: 40 minutes    Renato Applebaum, MD [facility]

## 2024-04-11 NOTE — Progress Notes (Signed)
 Daily Progress Note   Patient Name: [name]       Date: 04/11/2024 DOB: 1956-02-13  Age: 68 y.o. MRN#: [id number] Attending Physician: Raenelle Coria, MD Primary Care Physician: Maree Leni Edyth DELENA, MD Admit Date: 04/07/2024  Reason for Consultation/Follow-up: Establishing goals of care  Subjective: Awake alert Appears chronically ill  Length of Stay: 3  Current Medications: Scheduled Meds:   atorvastatin   10 mg Oral QHS   budesonide  (PULMICORT ) nebulizer solution  0.25 mg Nebulization BID   doxycycline   100 mg Oral Q12H   enalapril   10 mg Oral Daily   enoxaparin  (LOVENOX ) injection  30 mg Subcutaneous Q24H   guaiFENesin   600 mg Oral BID   ipratropium-albuterol   3 mL Nebulization BID   methylPREDNISolone  (SOLU-MEDROL ) injection  120 mg Intravenous Q24H   mirtazapine   7.5 mg Oral QHS   senna-docusate  1 tablet Oral BID    Continuous Infusions:   PRN Meds: albuterol , benzonatate , LORazepam , naLOXone (NARCAN)  injection, oxyCODONE -acetaminophen  **AND** oxyCODONE   Physical Exam         Chronically ill appearing Some coarse wheezing No edema Awake alert  Vital Signs: BP 126/79 (BP Location: Right Arm)   Pulse 61   Temp 98 F (36.7 C)   Resp 16   Ht 5' 1 (1.549 m)   Wt 43.1 kg   SpO2 99%   BMI 17.95 kg/m  SpO2: SpO2: 99 % O2 Device: O2 Device: Nasal Cannula O2 Flow Rate: O2 Flow Rate (L/min): 3 L/min  Intake/output summary:  Intake/Output Summary (Last 24 hours) at 04/11/2024 1225 Last data filed at 04/11/2024 1118 Gross per 24 hour  Intake 740 ml  Output 1550 ml  Net -810 ml   LBM: Last BM Date : 04/09/24 Baseline Weight: Weight: 43.1 kg Most recent weight: Weight: 43.1 kg       Palliative Assessment/Data:      Patient Active Problem List    Diagnosis Date Noted   HLD (hyperlipidemia) 03/28/2024   Chronic pain syndrome 03/28/2024   Chronic hypoxic respiratory failure (HCC) 12/02/2023   Continuous dependence on cigarette smoking 12/02/2023   Generalized anxiety disorder 12/02/2023   Acute exacerbation of chronic obstructive pulmonary disease (COPD) (HCC) 12/02/2023   Acute respiratory failure with hypoxia (HCC) 10/11/2023   Rectal bleeding 10/11/2023   Acute on chronic  hypoxic respiratory failure (HCC) 09/06/2023   Wheezing 05/24/2023   COPD exacerbation (HCC) 05/22/2023   Precordial chest pain 03/31/2023   Centrilobular emphysema (HCC) 12/13/2022   Palliative care by specialist 10/10/2022   Goals of care, counseling/discussion 10/10/2022   Vocal cord dysfunction 10/08/2022   Anxiety 10/08/2022   Protein-calorie malnutrition, severe 10/04/2022   COPD with acute exacerbation (HCC) 10/02/2022   Grief 06/03/2022   Pain of left upper extremity 12/28/2016   Elevated TSH 11/08/2016   Gastroesophageal reflux disease 11/08/2016   Constipation 11/08/2016   COPD (chronic obstructive pulmonary disease) (HCC) 11/08/2016   Tobacco use 11/08/2016   Non-cardiac chest pain 11/07/2016   Unstable angina (HCC) 01/07/2016   HTN (hypertension) 01/07/2016   Hypokalemia 11/06/2013   Acute blood loss anemia 11/06/2013   Osteoarthritis of left hip 11/05/2013   History of total left hip replacement 11/05/2013   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 Of note, patient has been seen and evaluated by palliative care in a previous hospitalization.  Recommendations/Plan: Recommend outpatient palliative care support.  [name] on discharge Outpatient pulmonology follow up on discharge.  Wants to remain full code.    Goals of Care and Additional Recommendations: Limitations on Scope of Treatment: Full Scope Treatment  Code Status:    Code Status Orders  (From admission, onward)           Start     Ordered   04/08/24 0639  Full code  Continuous       Question:  By:  Answer:  Consent: discussion documented in EHR   04/08/24 0640           Code Status History     Date Active Date Inactive Code Status Order ID Comments User Context   03/27/2024 1703 03/30/2024 1533 Full Code [id number]  [name], MD ED   01/01/2024 1409 01/02/2024 1916 Full Code [id number]  [name], MD ED   12/02/2023 2335 12/06/2023 1940 Full Code [id number]  [name], MD ED   10/11/2023 0849 10/16/2023 2048 Full Code [id number]  [name], MD ED   09/05/2023 0041 09/06/2023 2219 Full Code [id number]  [name], MD ED   05/24/2023 1953 05/27/2023 1815 Full Code [id number]  [name], DO ED   05/21/2023 1151 05/24/2023 1519 Full Code [id number]  [name], MD ED   10/02/2022 2216 10/11/2022 1801 Full Code [id number]  [name], MD ED   06/02/2022 2027 06/03/2022 1650 Full Code [id number]  [name], MD ED   11/07/2016 1353 11/07/2016 2308 Full Code [id number]  Tobie Darron GAILS, MD Inpatient   01/07/2016 1057 01/08/2016 1722 Full Code [id number]  Tobie Darron GAILS, MD ED   11/05/2013 1105 11/09/2013 1324 Full Code [id number]  [name], MD Inpatient   05/14/2012 1306 05/16/2012 1353 Full Code [id number]  [name], RN Inpatient       Prognosis:  Unable to determine  Discharge Planning: Home with Home Health  Care plan was  discussed with  IDT  Thank you for allowing the  Palliative Medicine Team to assist in the care of this patient.  Mod MDM     Greater than 50%  of this time was spent counseling and coordinating care related to the above assessment and plan.  Lonia Serve, MD  Please contact Palliative Medicine Team phone at [phone] for questions and concerns.

## 2024-04-12 DIAGNOSIS — J441 Chronic obstructive pulmonary disease with (acute) exacerbation: Secondary | ICD-10-CM | POA: Diagnosis not present

## 2024-04-12 MED ORDER — HYDROXYZINE HCL 25 MG PO TABS: 25.0000 mg | ORAL_TABLET | Freq: Three times a day (TID) | ORAL | 0 refills | Status: DC | PRN

## 2024-04-12 MED ORDER — PREDNISONE 20 MG PO TABS: 40.0000 mg | ORAL_TABLET | Freq: Every day | ORAL | 0 refills | Status: DC

## 2024-04-12 MED ORDER — FLUTICASONE-SALMETEROL 250-50 MCG/ACT IN AEPB: 1.0000 | INHALATION_SPRAY | Freq: Two times a day (BID) | RESPIRATORY_TRACT | 0 refills | Status: DC

## 2024-04-12 MED ORDER — SENNOSIDES-DOCUSATE SODIUM 8.6-50 MG PO TABS: 1.0000 | ORAL_TABLET | Freq: Two times a day (BID) | ORAL | 0 refills | Status: DC

## 2024-04-12 MED ORDER — GUAIFENESIN ER 600 MG PO TB12: 600.0000 mg | ORAL_TABLET | Freq: Two times a day (BID) | ORAL | 0 refills | Status: AC

## 2024-04-12 MED ORDER — BENZONATATE 100 MG PO CAPS: 200.0000 mg | ORAL_CAPSULE | Freq: Three times a day (TID) | ORAL | 0 refills | Status: AC | PRN

## 2024-04-12 NOTE — Discharge Summary (Signed)
 Physician Discharge Summary  [name] FMW:[id number] DOB: 1956/05/26 DOA: 04/07/2024  PCP: Maree Leni Edyth DELENA, MD  Admit date: 04/07/2024 Discharge date: 04/12/2024  Admitted From: Home Disposition: Home  Recommendations for Outpatient Follow-up:  Follow up with PCP in 1 week  Outpatient follow-up with pulmonary Recommend outpatient evaluation and follow-up by palliative care Follow up in ED if symptoms worsen or new appear   Home Health: PT/OT/RN Equipment/Devices: None  Discharge Condition: Stable CODE STATUS: Full Diet recommendation: Heart healthy  Brief/Interim Summary: 68 year old known COPD on 2 L oxygen at home, history of tobacco abuse, sleep apnea on CPAP, hypertension, hyperlipidemia, GERD, anxiety and recent hospitalization 10/31-11/3 for COPD exacerbation presented with worsening shortness of breath and was admitted for COPD exacerbation.  During the hospitalization, she was treated with IV Solu-Medrol .  Subsequently, her symptoms are slowly improving and she feels okay to go home today.  She will be discharged home today on oral prednisone .  Outpatient follow-up with PCP and pulmonary.  Discharge Diagnoses:   COPD exacerbation Chronic respiratory failure with hypoxia Goals of care - During the hospitalization, she was treated with IV Solu-Medrol .  Subsequently, her symptoms are slowly improving and she feels okay to go home today.  She will be discharged home today on oral prednisone  40 mg daily for 7 days.  Outpatient follow-up with PCP and pulmonary.  Continue home inhaled and nebulized regimen. - Patient has had recurrent hospitalizations for the same.  Palliative care evaluation appreciated.  Patient wants to remain full code.  Outpatient follow-up with palliative care - Continue supplemental oxygen  OSA - She is in the process of getting BiPAP.  Outpatient follow-up with pulmonary regarding the same  Hypertension Hyperlipidemia Continue statin and  enalapril   Chronic pain syndrome with opiate dependence -continue home regimen.  Outpatient follow-up with PCP and/or pain management  Depression and anxiety/mood disorder--patient is supposed to be on buspirone  at home which apparently she is not taking.  She needs to follow-up with her PCP regarding the same.  She is requesting for anxiety medications: Will give her Atarax  as needed. - Continue mirtazapine   Physical deconditioning - Refused SNF placement.  Will need home health PT/OT   Discharge Instructions  Discharge Instructions     Diet - low sodium heart healthy   Complete by: As directed    Increase activity slowly   Complete by: As directed       Allergies as of 04/12/2024   No Known Allergies      Medication List     STOP taking these medications    famotidine  40 MG tablet Commonly known as: PEPCID    Potassium 99 MG Tabs       TAKE these medications    albuterol  (2.5 MG/3ML) 0.083% nebulizer solution Commonly known as: PROVENTIL  Take 2.5 mg by nebulization every 6 (six) hours as needed for wheezing or shortness of breath.   albuterol  108 (90 Base) MCG/ACT inhaler Commonly known as: VENTOLIN  HFA Inhale 2 puffs into the lungs every 4 (four) hours as needed for wheezing or shortness of breath.   atorvastatin  10 MG tablet Commonly known as: LIPITOR Take 10 mg by mouth at bedtime.   benzonatate  100 MG capsule Commonly known as: TESSALON  Take 2 capsules (200 mg total) by mouth 3 (three) times daily as needed for cough. What changed: how much to take   busPIRone  7.5 MG tablet Commonly known as: BUSPAR  Take 7.5 mg by mouth 2 (two) times daily.   enalapril  10  MG tablet Commonly known as: VASOTEC  Take 1 tablet (10 mg total) by mouth daily.   fluticasone -salmeterol 250-50 MCG/ACT Aepb Commonly known as: Advair  Diskus Inhale 1 puff into the lungs in the morning and at bedtime.   furosemide 20 MG tablet Commonly known as: LASIX Take 20 mg by  mouth daily as needed.   guaiFENesin  600 MG 12 hr tablet Commonly known as: MUCINEX  Take 1 tablet (600 mg total) by mouth 2 (two) times daily.   hydrOXYzine  25 MG tablet Commonly known as: ATARAX  Take 1 tablet (25 mg total) by mouth 3 (three) times daily as needed for anxiety.   ipratropium-albuterol  0.5-2.5 (3) MG/3ML Soln Commonly known as: DUONEB Take 3 mLs by nebulization every 6 (six) hours as needed. What changed: when to take this   mirtazapine  7.5 MG tablet Commonly known as: REMERON  Take 7.5 mg by mouth at bedtime.   multivitamin capsule Take 1 capsule by mouth daily with breakfast.   oxyCODONE -acetaminophen  10-325 MG tablet Commonly known as: PERCOCET Take 1 tablet by mouth 5 (five) times daily as needed for severe pain (pain score 7-10).   OXYGEN Inhale 3 L/min into the lungs as needed (for shortness of breath).   polyethylene glycol 17 g packet Commonly known as: MIRALAX  / GLYCOLAX  Take 17 g by mouth daily as needed for mild constipation.   predniSONE  20 MG tablet Commonly known as: DELTASONE  Take 2 tablets (40 mg total) by mouth daily with breakfast for 7 days. Start taking on: April 13, 2024   PRESCRIPTION MEDICATION See admin instructions. CPAP- At bedtime   senna-docusate 8.6-50 MG tablet Commonly known as: Senokot-S Take 1 tablet by mouth 2 (two) times daily.        Contact information for follow-up providers     Maree Leni Edyth DELENA, MD. Schedule an appointment as soon as possible for a visit in 1 week(s).   Specialty: Family Medicine Contact information: [address] [place] [address] [phone]              Contact information for after-discharge care     Home Medical Care     [facility]) .   Service: Home Health Services Contact information: [phone] [facility] [place]  [phone]                    No Known Allergies  Consultations: Palliative  care   Procedures/Studies: DG Chest 2 View Result Date: 04/07/2024 EXAM: 2 VIEW(S) XRAY OF THE CHEST 04/07/2024 12:59:00 PM COMPARISON: 03/27/2024 CLINICAL HISTORY: Shortness of breath. FINDINGS: LUNGS AND PLEURA: Emphysema with hyperinflation, compatible with history of COPD. No pulmonary edema. No pleural effusion. No pneumothorax. HEART AND MEDIASTINUM: No acute abnormality of the cardiac and mediastinal silhouettes. BONES AND SOFT TISSUES: Degenerative changes of the thoracic spine. No acute osseous abnormality. IMPRESSION: 1. No acute cardiopulmonary findings. 2. COPD. Electronically signed by: Harrietta Sherry MD 04/07/2024 01:25 PM EST RP Workstation: [id number]   CT CHEST LUNG CA SCREEN LOW DOSE W/O CM Result Date: 04/02/2024 CLINICAL DATA:  Former smoker with 41.25 pack-year smoking history. Baseline lung cancer screening. EXAM: CT CHEST WITHOUT CONTRAST LOW-DOSE FOR LUNG CANCER SCREENING TECHNIQUE: Multidetector CT imaging of the chest was performed following the standard protocol without IV contrast. RADIATION DOSE REDUCTION: This exam was performed according to the departmental dose-optimization program which includes automated exposure control, adjustment of the mA and/or kV according to patient size and/or use of iterative reconstruction technique. COMPARISON:  CT chest dated 09/05/2023 FINDINGS: Cardiovascular: Normal heart size. No significant pericardial fluid/thickening. Great vessels are normal in course and caliber. Coronary artery calcifications and aortic atherosclerosis. Mediastinum/Nodes: Imaged thyroid  gland without nodules meeting criteria for imaging follow-up by size. Normal esophagus. No pathologically enlarged axillary, supraclavicular, mediastinal, or hilar lymph nodes. Lungs/Pleura: The central airways are patent. Severe centrilobular and paraseptal emphysema. Scattered calcified granulomata. 2.0 mm right lower lobe nodule (9: 217). No pneumothorax. No pleural effusion. Upper  abdomen: Normal. Musculoskeletal: No acute or abnormal lytic or blastic osseous lesions. Multilevel degenerative changes of the thoracic spine. IMPRESSION: 1. Lung-RADS 2, benign appearance or behavior. Continue annual screening with low-dose chest CT without contrast in 12 months. 2. Aortic Atherosclerosis (ICD10-I70.0) and Emphysema (ICD10-J43.9). Coronary artery calcifications. Assessment for potential risk factor modification, dietary therapy or pharmacologic therapy may be warranted, if clinically indicated. Electronically Signed   By: Limin  Xu M.D.   On: 04/02/2024 10:00   CT SOFT TISSUE NECK WO CONTRAST Result Date: 03/28/2024 EXAM: CT NECK WITHOUT CONTRAST 03/23/2024 01:44:42 PM TECHNIQUE: CT of the neck was performed without the administration of intravenous contrast. Multiplanar reformatted images are provided for review. Automated exposure control, iterative reconstruction, and/or weight based adjustment of the mA/kV was utilized to reduce the radiation dose to as low as reasonably achievable. The study is limited by the absence of intravenous contrast. COMPARISON: None available. CLINICAL HISTORY: Nodule on right side of neck. FINDINGS: AERODIGESTIVE TRACT: No discrete mass. No edema. There are no lesions evident within the nasal cavity, oral cavity, pharynx or larynx. SALIVARY GLANDS: The parotid and submandibular glands are unremarkable. THYROID : Unremarkable. LYMPH NODES: No suspicious cervical lymphadenopathy. SOFT TISSUES: There is a small subcutaneous nodule projecting laterally from the right neck at the level of C2-C3, measuring approximately 9 mm at its base. It demonstrates central Hounsfield units of approximately 16. It is seen on image 70 of coronal series 6. BRAIN, ORBITS, SINUSES AND MASTOIDS: No acute abnormality. LUNGS AND MEDIASTINUM: There is moderate-to-severe central lobular emphysema. BONES: No focal bone abnormality. VASCULATURE: There is mild-to-moderate calcification of the  carotid bulbs, worse on the left. IMPRESSION: 1. Small 9 mm subcutaneous nodule in the right lateral neck at C2-3 with central attenuation ~16 HU; no lymphadenopathy. 2. Mild-to-moderate carotid bulb calcifications, left greater than right. 3. Moderate-to-severe centrilobular emphysema. For patients aged 70, consider evaluation for eligibility for a low-dose CT lung cancer screening program, as emphysema is an independent risk factor. Electronically signed by: Evalene Coho MD 03/28/2024 07:34 AM EDT RP Workstation: [id number]   DG Chest Port 1 View Result Date: 03/27/2024 EXAM: 1 VIEW(S) XRAY OF THE CHEST 03/27/2024 02:35:00 PM COMPARISON: 02/25/2024 CLINICAL HISTORY: SOB FINDINGS: LUNGS AND PLEURA: Emphysema. No focal pulmonary opacity. No pulmonary edema. No pleural effusion. No pneumothorax. HEART AND MEDIASTINUM: Tortuous aorta with aortic atherosclerosis. No acute abnormality of the cardiac and mediastinal silhouettes. BONES AND SOFT TISSUES: Multilevel thoracic osteophytosis. No acute osseous abnormality. IMPRESSION: 1. No acute cardiopulmonary process. 2. Emphysema. Electronically signed by: Norleen Boxer MD 03/27/2024 03:27 PM EDT RP Workstation: [id number]      Subjective: Patient seen and examined at bedside.  Feels okay to go home today.  Still short of breath with exertion.  No fever or vomiting reported.  Discharge Exam: Vitals:   04/12/24 0801 04/12/24 0804  BP:    Pulse:    Resp:    Temp:    SpO2: 99% 99%    General: Pt is alert, awake, not in acute distress.  Chronically ill and deconditioned looking.  On supplemental oxygen.  Slow to respond.  Poor historian. Cardiovascular: rate controlled, S1/S2 + Respiratory: bilateral decreased breath sounds at bases with some scattered crackles Abdominal: Soft, NT, ND, bowel sounds + Extremities: no edema, no cyanosis    The results of significant diagnostics from this hospitalization (including imaging, microbiology,  ancillary and laboratory) are listed below for reference.     Microbiology: Recent Results (from the past 240 hours)  Resp panel by RT-PCR (RSV, Flu A&B, Covid) Anterior Nasal Swab     Status: None   Collection Time: 04/07/24  5:46 PM   Specimen: Anterior Nasal Swab  Result Value Ref Range Status   SARS Coronavirus 2 by RT PCR NEGATIVE NEGATIVE Final   Influenza A by PCR NEGATIVE NEGATIVE Final   Influenza B by PCR NEGATIVE NEGATIVE Final    Comment: (NOTE) The Xpert Xpress SARS-CoV-2/FLU/RSV plus assay is intended as an aid in the diagnosis of influenza from Nasopharyngeal swab specimens and should not be used as a sole basis for treatment. Nasal washings and aspirates are unacceptable for Xpert Xpress SARS-CoV-2/FLU/RSV testing.  Fact Sheet for Patients: bloggercourse.com  Fact Sheet for Healthcare Providers: seriousbroker.it  This test is not yet approved or cleared by the [place]  FDA and has been authorized for detection and/or diagnosis of SARS-CoV-2 by FDA under an Emergency Use Authorization (EUA). This EUA will remain in effect (meaning this test can be used) for the duration of the COVID-19 declaration under Section 564(b)(1) of the Act, 21 U.S.C. section 360bbb-3(b)(1), unless the authorization is terminated or revoked.     Resp Syncytial Virus by PCR NEGATIVE NEGATIVE Final    Comment: (NOTE) Fact Sheet for Patients: bloggercourse.com  Fact Sheet for Healthcare Providers: seriousbroker.it  This test is not yet approved or cleared by the [place]  FDA and has been authorized for detection and/or diagnosis of SARS-CoV-2 by FDA under an Emergency Use Authorization (EUA). This EUA will remain in effect (meaning this test can be used) for the duration of the COVID-19 declaration under Section 564(b)(1) of the Act, 21 U.S.C. section 360bbb-3(b)(1), unless the  authorization is terminated or revoked.  Performed at [facility], [address] [place], [place] [address]      Labs: BNP (last 3 results) Recent Labs    12/02/23 2209 03/27/24 1342 04/07/24 1247  BNP 32.9 61.6 83.3   Basic Metabolic Panel: Recent Labs  Lab 04/07/24 1247 04/07/24 1917 04/09/24 0551  NA 140 137 141  K 3.7 5.7* 4.0  CL 100  --  102  CO2 31  --  36*  GLUCOSE 98  --  96  BUN 8  --  15  CREATININE 0.62  --  0.63  CALCIUM  8.7*  --  8.9  MG  --   --  2.2   Liver Function Tests: Recent Labs  Lab 04/07/24 1247  AST 19  ALT 13  ALKPHOS 27*  BILITOT 0.8  PROT 5.3*  ALBUMIN 3.4*   No results for input(s): LIPASE, AMYLASE in the last 168 hours. No results for input(s): AMMONIA in the last 168 hours. CBC: Recent Labs  Lab 04/07/24 1247 04/07/24 1917 04/09/24 0551  WBC 4.7  --  12.5*  HGB 10.2* 10.9* 10.4*  HCT 33.9* 32.0* 35.2*  MCV 96.3  --  99.4  PLT 310  --  322   Cardiac Enzymes: No results for input(s): CKTOTAL, CKMB, CKMBINDEX, TROPONINI in the last 168 hours. BNP:  Invalid input(s): POCBNP CBG: No results for input(s): GLUCAP in the last 168 hours. D-Dimer No results for input(s): DDIMER in the last 72 hours. Hgb A1c No results for input(s): HGBA1C in the last 72 hours. Lipid Profile No results for input(s): CHOL, HDL, LDLCALC, TRIG, CHOLHDL, LDLDIRECT in the last 72 hours. Thyroid  function studies No results for input(s): TSH, T4TOTAL, T3FREE, THYROIDAB in the last 72 hours.  Invalid input(s): FREET3 Anemia work up No results for input(s): VITAMINB12, FOLATE, FERRITIN, TIBC, IRON, RETICCTPCT in the last 72 hours. Urinalysis    Component Value Date/Time   COLORURINE YELLOW 01/21/2024 1557   APPEARANCEUR HAZY (A) 01/21/2024 1557   LABSPEC 1.015 01/21/2024 1557   PHURINE 5.0 01/21/2024 1557   GLUCOSEU NEGATIVE 01/21/2024 1557   HGBUR NEGATIVE 01/21/2024 1557    BILIRUBINUR NEGATIVE 01/21/2024 1557   KETONESUR NEGATIVE 01/21/2024 1557   PROTEINUR NEGATIVE 01/21/2024 1557   UROBILINOGEN 0.2 10/29/2013 1114   NITRITE NEGATIVE 01/21/2024 1557   LEUKOCYTESUR NEGATIVE 01/21/2024 1557   Sepsis Labs Recent Labs  Lab 04/07/24 1247 04/09/24 0551  WBC 4.7 12.5*   Microbiology Recent Results (from the past 240 hours)  Resp panel by RT-PCR (RSV, Flu A&B, Covid) Anterior Nasal Swab     Status: None   Collection Time: 04/07/24  5:46 PM   Specimen: Anterior Nasal Swab  Result Value Ref Range Status   SARS Coronavirus 2 by RT PCR NEGATIVE NEGATIVE Final   Influenza A by PCR NEGATIVE NEGATIVE Final   Influenza B by PCR NEGATIVE NEGATIVE Final    Comment: (NOTE) The Xpert Xpress SARS-CoV-2/FLU/RSV plus assay is intended as an aid in the diagnosis of influenza from Nasopharyngeal swab specimens and should not be used as a sole basis for treatment. Nasal washings and aspirates are unacceptable for Xpert Xpress SARS-CoV-2/FLU/RSV testing.  Fact Sheet for Patients: bloggercourse.com  Fact Sheet for Healthcare Providers: seriousbroker.it  This test is not yet approved or cleared by the [place]  FDA and has been authorized for detection and/or diagnosis of SARS-CoV-2 by FDA under an Emergency Use Authorization (EUA). This EUA will remain in effect (meaning this test can be used) for the duration of the COVID-19 declaration under Section 564(b)(1) of the Act, 21 U.S.C. section 360bbb-3(b)(1), unless the authorization is terminated or revoked.     Resp Syncytial Virus by PCR NEGATIVE NEGATIVE Final    Comment: (NOTE) Fact Sheet for Patients: bloggercourse.com  Fact Sheet for Healthcare Providers: seriousbroker.it  This test is not yet approved or cleared by the [place]  FDA and has been authorized for detection and/or diagnosis of  SARS-CoV-2 by FDA under an Emergency Use Authorization (EUA). This EUA will remain in effect (meaning this test can be used) for the duration of the COVID-19 declaration under Section 564(b)(1) of the Act, 21 U.S.C. section 360bbb-3(b)(1), unless the authorization is terminated or revoked.  Performed at [facility], [address], [place], [place] [address]      Time coordinating discharge: 35 minutes  SIGNED:   Sophie Mao, MD  [facility] 04/12/2024, 10:10 AM

## 2024-04-12 NOTE — Progress Notes (Signed)
   04/12/24 0003  BiPAP/CPAP/SIPAP  $ Non-Invasive Home Ventilator  Subsequent  BiPAP/CPAP/SIPAP Pt Type Adult  BiPAP/CPAP/SIPAP DREAMSTATIOND  Mask Type Full face mask  Dentures removed? Not applicable  Mask Size Medium  Flow Rate 3 lpm  Patient Home Machine No  Patient Home Mask No  Patient Home Tubing No  Auto Titrate Yes  Minimum cmH2O 5 cmH2O  Maximum cmH2O 20 cmH2O  Device Plugged into RED Power Outlet Yes

## 2024-04-12 NOTE — Progress Notes (Signed)
 Daily Progress Note   Patient Name: [name]       Date: 04/12/2024 DOB: 1955/09/17  Age: 68 y.o. MRN#: [id number] Attending Physician: Cheryle Page, MD Primary Care Physician: Maree Leni Edyth DELENA, MD Admit Date: 04/07/2024  Reason for Consultation/Follow-up: Establishing goals of care  Subjective: Awake alert Dressed, states she is being discharged from the hospital today No distress, is able to speak in full sentences, is able to move about in her room without distress.   Length of Stay: 4  Current Medications: Scheduled Meds:   atorvastatin   10 mg Oral QHS   budesonide  (PULMICORT ) nebulizer solution  0.25 mg Nebulization BID   doxycycline   100 mg Oral Q12H   enalapril   10 mg Oral Daily   enoxaparin  (LOVENOX ) injection  30 mg Subcutaneous Q24H   guaiFENesin   600 mg Oral BID   ipratropium-albuterol   3 mL Nebulization BID   methylPREDNISolone  (SOLU-MEDROL ) injection  120 mg Intravenous Q24H   mirtazapine   7.5 mg Oral QHS   senna-docusate  1 tablet Oral BID    Continuous Infusions:   PRN Meds: albuterol , benzonatate , LORazepam , naLOXone (NARCAN)  injection, oxyCODONE -acetaminophen  **AND** oxyCODONE   Physical Exam         Chronically ill appearing On O2 [place] No edema Awake alert  Vital Signs: BP (!) 141/82 (BP Location: Left Arm)   Pulse 70   Temp 99 F (37.2 C) (Oral)   Resp 17   Ht 5' 1 (1.549 m)   Wt 43.1 kg   SpO2 99%   BMI 17.95 kg/m  SpO2: SpO2: 99 % O2 Device: O2 Device: Nasal Cannula O2 Flow Rate: O2 Flow Rate (L/min): 3 L/min  Intake/output summary:  Intake/Output Summary (Last 24 hours) at 04/12/2024 0953 Last data filed at 04/11/2024 2300 Gross per 24 hour  Intake 740 ml  Output 500 ml  Net 240 ml   LBM: Last BM Date : 04/11/24 Baseline  Weight: Weight: 43.1 kg Most recent weight: Weight: 43.1 kg       Palliative Assessment/Data:      Patient Active Problem List   Diagnosis Date Noted   HLD (hyperlipidemia) 03/28/2024   Chronic pain syndrome 03/28/2024   Chronic hypoxic respiratory failure (HCC) 12/02/2023   Continuous dependence on cigarette smoking 12/02/2023   Generalized anxiety disorder 12/02/2023  Acute exacerbation of chronic obstructive pulmonary disease (COPD) (HCC) 12/02/2023   Acute respiratory failure with hypoxia (HCC) 10/11/2023   Rectal bleeding 10/11/2023   Acute on chronic hypoxic respiratory failure (HCC) 09/06/2023   Wheezing 05/24/2023   COPD exacerbation (HCC) 05/22/2023   Precordial chest pain 03/31/2023   Centrilobular emphysema (HCC) 12/13/2022   Palliative care by specialist 10/10/2022   Goals of care, counseling/discussion 10/10/2022   Vocal cord dysfunction 10/08/2022   Anxiety 10/08/2022   Protein-calorie malnutrition, severe 10/04/2022   COPD with acute exacerbation (HCC) 10/02/2022   Grief 06/03/2022   Pain of left upper extremity 12/28/2016   Elevated TSH 11/08/2016   Gastroesophageal reflux disease 11/08/2016   Constipation 11/08/2016   COPD (chronic obstructive pulmonary disease) (HCC) 11/08/2016   Tobacco use 11/08/2016   Non-cardiac chest pain 11/07/2016   Unstable angina (HCC) 01/07/2016   HTN (hypertension) 01/07/2016   Hypokalemia 11/06/2013   Acute blood loss anemia 11/06/2013   Osteoarthritis of left hip 11/05/2013   History of total left hip replacement 11/05/2013   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 Of note, patient has been seen and evaluated by palliative care in a previous hospitalization.  Recommendations/Plan: Recommend outpatient palliative care support.  [name] on discharge Outpatient pulmonology follow up on discharge.  Wants to remain full code.    Goals of Care and Additional Recommendations: Limitations on Scope of Treatment: Full Scope Treatment  Code Status:    Code Status Orders  (From admission, onward)           Start     Ordered   04/08/24 0639  Full code  Continuous       Question:  By:  Answer:  Consent: discussion documented in EHR   04/08/24 0640           Code Status History     Date Active Date Inactive Code Status Order ID Comments User Context   03/27/2024 1703 03/30/2024 1533 Full Code [id number]  [name], MD ED   01/01/2024 1409 01/02/2024 1916 Full Code [id number]  [name], MD ED   12/02/2023 2335 12/06/2023 1940 Full Code [id number]  [name], MD ED   10/11/2023 0849 10/16/2023 2048 Full Code [id number]  [name], MD ED   09/05/2023 0041 09/06/2023 2219 Full Code [id number]  [name], MD ED   05/24/2023 1953 05/27/2023 1815 Full Code [id number]  [name], DO ED   05/21/2023 1151 05/24/2023 1519 Full Code [id number]  [name], MD ED   10/02/2022 2216 10/11/2022 1801 Full Code [id number]  [name], MD ED   06/02/2022 2027 06/03/2022 1650 Full Code [id number]  [name], MD ED   11/07/2016 1353 11/07/2016 2308 Full Code [id number]  Tobie Darron GAILS, MD Inpatient   01/07/2016 1057 01/08/2016 1722 Full Code [id number]  Tobie Darron GAILS, MD ED   11/05/2013 1105 11/09/2013 1324 Full Code [id number]  [name], MD Inpatient   05/14/2012 1306 05/16/2012 1353 Full Code  [id number]  Clark, Lancie E, RN Inpatient        Prognosis:  guarded  Discharge Planning: Home with Home Health  Care plan was discussed with patient  Thank you for allowing the Palliative Medicine Team to assist in the care of this patient.  Mod MDM     Greater than 50%  of this time was spent counseling and coordinating care related to the above assessment and plan.  Lonia Serve, MD  Please contact Palliative Medicine Team phone at [phone] for questions and concerns.

## 2024-04-12 NOTE — TOC Transition Note (Addendum)
 Transition of Care [facility]) - Discharge Note   Patient Details  Name: [name] MRN: [id number] Date of Birth: [birth year]  Transition of Care [facility]) CM/SW Contact:  Jon ONEIDA Anon, RN Phone Number: 04/12/2024, 10:56 AM   Clinical Narrative:    Pt to discharge home and receive [facility] services through [facility]. Pt is agreeable to the DC plan. HH orders are in place. There are no further IP CM needs at this time.  Addendum: Pt needing travel tank delivered to room. RNCM called [employer] to have tank delivered. Rep states it should be delivered within half an hour. Pt also without transportation. Taxi voucher given to pt RN with instructions to call once pt is ready to DC.    Final next level of care: Home w Home Health Services Barriers to Discharge: Barriers Resolved   Patient Goals and CMS Choice Patient states their goals for this hospitalization and ongoing recovery are:: To return home with [facility] services CMS Medicare.gov Compare Post Acute Care list provided to:: Patient Choice offered to / list presented to : Patient [place] ownership interest in [facility].provided to:: Patient    Discharge Placement                       Discharge Plan and Services Additional resources added to the After Visit Summary for   In-house Referral: Clinical Social Work              DME Arranged: N/A DME Agency: NA       HH Arranged: RN, PT, OT HH Agency: Other - See comment Producer, Television/film/video) Date HH Agency Contacted: 04/09/24 Time HH Agency Contacted: [phone] Representative spoke with at [facility] Agency: Jon  Social Drivers of Health (SDOH) Interventions SDOH Screenings   Food Insecurity: No Food Insecurity (04/08/2024)  Housing: High Risk (04/08/2024)  Transportation Needs: No Transportation Needs (04/08/2024)  Utilities: Not At Risk (04/08/2024)  Depression (PHQ2-9): Low Risk  (10/05/2020)  Financial Resource Strain: Low Risk  (10/05/2020)  Social Connections:  Socially Isolated (04/08/2024)  Stress: No Stress Concern Present (10/05/2020)  Tobacco Use: High Risk (04/07/2024)     Readmission Risk Interventions    04/09/2024   10:04 AM 12/04/2023    3:15 PM 10/04/2022    9:50 AM  Readmission Risk Prevention Plan  Post Dischage Appt   Complete  Medication Screening   Complete  Transportation Screening Complete Complete Complete  PCP or Specialist Appt within 3-5 Days  Complete   HRI or Home Care Consult  Complete   Social Work Consult for Recovery Care Planning/Counseling  Complete   Palliative Care Screening  Not Applicable   Medication Review Oceanographer) Complete Complete   HRI or Home Care Consult Complete    SW Recovery Care/Counseling Consult Complete    Palliative Care Screening Not Applicable    Skilled Nursing Facility Not Applicable

## 2024-04-20 ENCOUNTER — Encounter: Payer: Self-pay | Admitting: Pulmonary Disease

## 2024-04-20 ENCOUNTER — Telehealth: Payer: Self-pay

## 2024-04-20 ENCOUNTER — Ambulatory Visit: Admitting: Pulmonary Disease

## 2024-04-20 VITALS — BP 139/88 | HR 78 | Ht 61.0 in | Wt 99.2 lb

## 2024-04-20 DIAGNOSIS — J9612 Chronic respiratory failure with hypercapnia: Secondary | ICD-10-CM | POA: Diagnosis not present

## 2024-04-20 DIAGNOSIS — J432 Centrilobular emphysema: Secondary | ICD-10-CM

## 2024-04-20 MED ORDER — PREDNISONE 10 MG PO TABS: ORAL_TABLET | ORAL | 0 refills | Status: AC

## 2024-04-20 MED ORDER — IPRATROPIUM-ALBUTEROL 0.5-2.5 (3) MG/3ML IN SOLN: 3.0000 mL | Freq: Four times a day (QID) | RESPIRATORY_TRACT | 11 refills | Status: AC

## 2024-04-20 MED ORDER — BUDESONIDE 0.5 MG/2ML IN SUSP: 0.5000 mg | Freq: Two times a day (BID) | RESPIRATORY_TRACT | 11 refills | Status: AC

## 2024-04-20 NOTE — Telephone Encounter (Signed)
 Ohtuvayre  paperwork handed into pharmacy

## 2024-04-20 NOTE — Patient Instructions (Addendum)
 Nebulizer regimen: - Take budesonide  nebulizer twice daily - Take Duoneb nebulizer (ipratropium/albuterol ) every 6 hours  We will try getting you qualified for Ohtuvayre  nebulizer treatments twice daily  Use albuterol  inhaler as needed 1-2 puffs every 4-6 hours as needed  Take prednisone  taper  We will work with [employer] to get you a new machine and mask to use at night to help with your breathing  We will check lab today, alpha 1 antitrypsin level  Your lung cancer screening scan is negative for abnormal spots on the lungs  Talk with your primary care about the spot on the right side of your neck  Follow up in 3 months

## 2024-04-20 NOTE — Progress Notes (Addendum)
 Synopsis: Referred in June 2024 for hospital follow up - COPD   Subjective:   PATIENT ID: [name] GENDER: female DOB: 08-08-1955, MRN: [id number]  HPI  Chief Complaint  Patient presents with   Medical Management of Chronic Issues    Pt states she wants a Bipap STA    [name] is a 68 year old woman, daily smoker with history of hypertension and COPD who returns to pulmonary clinic for follow up of chronic hypercapnic respiratory failure.   OV 11/05/22 She was admitted 5/8 to 5/16 for COPD exacerbation and acute hypoxemic respiratory failure. She was weaned off supplemental oxygen prior to discharge. She is using oxygen at night with sleep. She is using trelegy ellipta  200mcg 1 puff daily and as needed albuterol  nebulizer treatments. She reports she did not get prednisone  upon discharge from the hospital.   She complains of falling asleep easily and will be forgetful. ABG in hospital showed pH 7.33, pCO2 72, pO2 193. Her serumb bicarb ranges 29-37 indicating chronic hypercapnia.   OV 07/24/2023 She is experiencing difficulties obtaining a CPAP machine and a portable oxygen concentrator due to issues with the oxygen company and insurance. She found the CPAP beneficial during her hospital stay and believes it will help at home as well.  She was hospitalized from December 23 to May 27, 2023, and was discharged on December 27, only to be readmitted shortly after for her COPD.  She is currently using a nebulizer at home and prefers nebulizer solutions over inhalers due to concerns about oral hygiene and potential side effects like thrush. She has stopped using the Dulera  inhaler because she forgets to rinse her mouth afterward. She prefers prednisone , noting that she has been given higher doses during emergency visits without experiencing side effects.  She has difficulty gaining weight. She is interested in gaining weight.  She is trying to quit smoking and has reduced her  intake to about three cigarettes a day. She is interested in nicotine  lozenges as a cessation aid.  OV 04/20/24 Discussed the use of AI scribe software for clinical note transcription with the patient, who gave verbal consent to proceed.  History of Present Illness  She has experienced multiple COPD exacerbations this fall, resulting in several emergency department visits and hospital admissions, most recently from November 12th to November 16th. She experiences significant dyspnea and wheezing, especially with minimal exertion, requiring frequent nebulizer and oxygen use. Anxiety and panic episodes related to her breathing difficulties necessitate immediate nebulizer treatments.  She reports difficulty with her CPAP machine due to discomfort with the mask and excessive pressure settings, despite adjustments. She expresses a need for a different machine. She has only been using duoneb nebulizer treatments or albuterol  rescue inhaler. She has had issues with advair  diskus and thrush, and has not been using this inhaler.   She recently quit smoking one to two months ago. She attributes her respiratory issues to environmental factors in her apartment, suspecting mold or other irritants, as symptoms began after moving there.     Past Medical History:  Diagnosis Date   Anxiety    Arthritis    Asthma    Chest pain 2006   Chest pain 10/2016   Chronic back pain    COPD (chronic obstructive pulmonary disease) (HCC)    Hypertension    Sciatica    Shortness of breath    due to  smoking      Family History  Problem Relation Age of  Onset   Heart attack Mother 14   Hypertension Sister    Diabetes type II Sister    Tongue cancer Brother    Heart attack Sister 69   Lung cancer Sister    Hypertension Sister      Social History   Socioeconomic History   Marital status: Widowed    Spouse name: Not on file   Number of children: Not on file   Years of education: Not on file   Highest  education level: Not on file  Occupational History   Occupation: Retired  Tobacco Use   Smoking status: Some Days    Current packs/day: 0.10    Average packs/day: 0.1 packs/day for 30.0 years (3.0 ttl pk-yrs)    Types: Cigarettes   Smokeless tobacco: Never   Tobacco comments:    Smoking since teenage years  Vaping Use   Vaping status: Never Used  Substance and Sexual Activity   Alcohol use: No   Drug use: No   Sexual activity: Never  Other Topics Concern   Not on file  Social History Narrative   Great-grandmother   Used to work as a corporate treasurer, now volunteers w/ free time at [employer]   Social Drivers of [employer] Strain: Low Risk  (10/05/2020)   Overall Financial Resource Strain (CARDIA)    Difficulty of Paying Living Expenses: Not hard at all  Food Insecurity: No Food Insecurity (04/08/2024)   Hunger Vital Sign    Worried About Running Out of Food in the Last Year: Never true    Ran Out of Food in the Last Year: Never true  Transportation Needs: No Transportation Needs (04/08/2024)   PRAPARE - Administrator, Civil Service (Medical): No    Lack of Transportation (Non-Medical): No  Physical Activity: Not on file  Stress: No Stress Concern Present (10/05/2020)   [employer] of Occupational Health - Occupational Stress Questionnaire    Feeling of Stress : Not at all  Social Connections: Socially Isolated (04/08/2024)   Social Connection and Isolation Panel    Frequency of Communication with Friends and Family: Once a week    Frequency of Social Gatherings with Friends and Family: Once a week    Attends Religious Services: Never    Database Administrator or Organizations: Yes    Attends Engineer, Structural: More than 4 times per year    Marital Status: Widowed  Intimate Partner Violence: Not At Risk (04/08/2024)   Humiliation, Afraid, Rape, and Kick questionnaire    Fear of Current or Ex-Partner: No    Emotionally  Abused: No    Physically Abused: No    Sexually Abused: No     No Known Allergies   Outpatient Medications Prior to Visit  Medication Sig Dispense Refill   albuterol  (PROVENTIL ) (2.5 MG/3ML) 0.083% nebulizer solution Take 2.5 mg by nebulization every 6 (six) hours as needed for wheezing or shortness of breath.     albuterol  (VENTOLIN  HFA) 108 (90 Base) MCG/ACT inhaler Inhale 2 puffs into the lungs every 4 (four) hours as needed for wheezing or shortness of breath. 18 g 11   atorvastatin  (LIPITOR) 10 MG tablet Take 10 mg by mouth at bedtime.     benzonatate  (TESSALON ) 100 MG capsule Take 2 capsules (200 mg total) by mouth 3 (three) times daily as needed for cough. 30 capsule 0   busPIRone  (BUSPAR ) 7.5 MG tablet Take 7.5 mg by mouth 2 (two) times  daily.     enalapril  (VASOTEC ) 10 MG tablet Take 1 tablet (10 mg total) by mouth daily. 30 tablet 0   furosemide  (LASIX ) 20 MG tablet Take 20 mg by mouth daily as needed.     guaiFENesin  (MUCINEX ) 600 MG 12 hr tablet Take 1 tablet (600 mg total) by mouth 2 (two) times daily. 30 tablet 0   hydrOXYzine  (ATARAX ) 25 MG tablet Take 1 tablet (25 mg total) by mouth 3 (three) times daily as needed for anxiety. 30 tablet 0   mirtazapine  (REMERON ) 7.5 MG tablet Take 7.5 mg by mouth at bedtime.     Multiple Vitamin (MULTIVITAMIN) capsule Take 1 capsule by mouth daily with breakfast.     oxyCODONE -acetaminophen  (PERCOCET) 10-325 MG tablet Take 1 tablet by mouth 5 (five) times daily as needed for severe pain (pain score 7-10).     OXYGEN Inhale 3 L/min into the lungs as needed (for shortness of breath).     polyethylene glycol (MIRALAX  / GLYCOLAX ) 17 g packet Take 17 g by mouth daily as needed for mild constipation. 14 each 0   PRESCRIPTION MEDICATION See admin instructions. CPAP- At bedtime     senna-docusate (SENOKOT-S) 8.6-50 MG tablet Take 1 tablet by mouth 2 (two) times daily. 30 tablet 0   fluticasone -salmeterol (ADVAIR  DISKUS) 250-50 MCG/ACT AEPB Inhale 1  puff into the lungs in the morning and at bedtime. 60 each 0   ipratropium-albuterol  (DUONEB) 0.5-2.5 (3) MG/3ML SOLN Take 3 mLs by nebulization every 6 (six) hours as needed. (Patient taking differently: Take 3 mLs by nebulization 3 (three) times daily.) 90 mL 2   predniSONE  (DELTASONE ) 20 MG tablet Take 2 tablets (40 mg total) by mouth daily with breakfast for 7 days. 14 tablet 0   No facility-administered medications prior to visit.    Review of Systems  Constitutional:  Positive for malaise/fatigue and weight loss. Negative for chills and fever.  HENT:  Negative for congestion, sinus pain and sore throat.   Eyes: Negative.   Respiratory:  Positive for cough, shortness of breath and wheezing. Negative for hemoptysis and sputum production.   Cardiovascular:  Negative for chest pain, palpitations, orthopnea, claudication and leg swelling.  Gastrointestinal:  Negative for abdominal pain, heartburn, nausea and vomiting.  Genitourinary: Negative.   Musculoskeletal:  Negative for joint pain and myalgias.  Skin:  Negative for rash.  Neurological:  Negative for weakness.  Endo/Heme/Allergies: Negative.   Psychiatric/Behavioral: Negative.     Objective:   Vitals:   04/20/24 1026  BP: 139/88  Pulse: 78  SpO2: 97%  Weight: 99 lb 3.2 oz (45 kg)  Height: 5' 1 (1.549 m)  PF: (!) 4 L/min   Physical Exam Constitutional:      General: She is not in acute distress.    Appearance: She is not ill-appearing.  HENT:     Head: Normocephalic and atraumatic.  Eyes:     General: No scleral icterus.    Conjunctiva/sclera: Conjunctivae normal.  Cardiovascular:     Rate and Rhythm: Normal rate and regular rhythm.     Pulses: Normal pulses.     Heart sounds: Normal heart sounds. No murmur heard. Pulmonary:     Effort: Pulmonary effort is normal.     Breath sounds: Normal breath sounds. Decreased air movement present. No wheezing, rhonchi or rales.  Musculoskeletal:     Right lower leg: No  edema.     Left lower leg: No edema.  Lymphadenopathy:     Cervical: No  cervical adenopathy.  Skin:    General: Skin is warm and dry.  Neurological:     Mental Status: She is alert.    CBC    Component Value Date/Time   WBC 12.5 (H) 04/09/2024 0551   RBC 3.54 (L) 04/09/2024 0551   HGB 10.4 (L) 04/09/2024 0551   HCT 35.2 (L) 04/09/2024 0551   PLT 322 04/09/2024 0551   MCV 99.4 04/09/2024 0551   MCH 29.4 04/09/2024 0551   MCHC 29.5 (L) 04/09/2024 0551   RDW 15.3 04/09/2024 0551   LYMPHSABS 1.2 03/27/2024 1342   MONOABS 0.4 03/27/2024 1342   EOSABS 0.1 03/27/2024 1342   BASOSABS 0.0 03/27/2024 1342      Latest Ref Rng & Units 04/09/2024    5:51 AM 04/07/2024    7:17 PM 04/07/2024   12:47 PM  BMP  Glucose 70 - 99 mg/dL 96   98   BUN 8 - 23 mg/dL 15   8   Creatinine 9.55 - 1.00 mg/dL 9.36   9.37   Sodium 864 - 145 mmol/L 141  137  140   Potassium 3.5 - 5.1 mmol/L 4.0  5.7  3.7   Chloride 98 - 111 mmol/L 102   100   CO2 22 - 32 mmol/L 36   31   Calcium  8.9 - 10.3 mg/dL 8.9   8.7    Chest imaging: LCS CT Chest 03/23/24 Mediastinum/Nodes: Imaged thyroid  gland without nodules meeting criteria for imaging follow-up by size. Normal esophagus. No pathologically enlarged axillary, supraclavicular, mediastinal, or hilar lymph nodes.   Lungs/Pleura: The central airways are patent. Severe centrilobular and paraseptal emphysema. Scattered calcified granulomata. 2.0 mm right lower lobe nodule (9: 217). No pneumothorax. No pleural effusion.  PFT:    Latest Ref Rng & Units 11/20/2016    8:41 AM  PFT Results  FVC-Pre L 2.04   FVC-Predicted Pre % 90   FVC-Post L 2.54   FVC-Predicted Post % 113   Pre FEV1/FVC % % 58   Post FEV1/FCV % % 55   FEV1-Pre L 1.18   FEV1-Predicted Pre % 67   FEV1-Post L 1.40   DLCO uncorrected ml/min/mmHg 9.28   DLCO UNC% % 45   DLVA Predicted % 53   TLC L 9.65   TLC % Predicted % 209   RV % Predicted % 384     Labs:  Path:  Echo  10/11/23: LV EF is 60-65%. RV systolic function and size is normal.   Heart Catheterization:    Bipap Titration Study 12/13/22 IMPRESSIONS - She did well with Bipap 8/4 cm H2O. - Supplemental oxygen was not used during this study. - No snoring was audible during this study. - 2-lead EKG demonstrated: PVCs - Mild periodic limb movements were observed during this study. Arousals associated with PLMs were significant.   DIAGNOSIS - COPD with Emphysema - Chronic Respiratory Failure - Periodic Limb Movement During Sleep   RECOMMENDATIONS - Trial of BiPAP therapy on 8/4 cm H2O with a Small size Resmed Full Face AirFit F10 mask and heated humidification. - Assess for the presence of restless leg syndrome. - Avoid alcohol, sedatives and other CNS depressants that may worsen sleep apnea and disrupt normal sleep architecture. - Sleep hygiene should be reviewed to assess factors that may improve sleep quality. - Weight management and regular exercise should be initiated or continued.  Assessment & Plan:   Centrilobular emphysema (HCC) - Plan: Alpha-1 antitrypsin phenotype, Alpha-1-antitrypsin, budesonide  (PULMICORT ) 0.5 MG/2ML nebulizer  solution, ipratropium-albuterol  (DUONEB) 0.5-2.5 (3) MG/3ML SOLN, predniSONE  (DELTASONE ) 10 MG tablet, Ambulatory Referral for DME, Ambulatory referral to Pharmacotherapy Clinic  Chronic hypercapnic respiratory failure (HCC) - Plan: Ambulatory Referral for DME  Discussion: [name] is a 68 year old woman, daily smoker with history of hypertension and COPD who returns to pulmonary clinic for follow up of chronic hypercapnic respiratory failure.    Chronic Obstructive Pulmonary Disease (COPD) Recurrent exacerbations due to multiple reasons including cigarette smoking until 1-2 months ago, non-adherence to maintenance therapy and side effects to inhalers. -avoid dry powder inhalers due to thrush -Start Budesonide  nebulizer solution twice daily. -Start Duoneb  nebulizer solution four times daily. -Start prednisone  40mg  daily for 5 days and Zpak -Check alpha1 antitrypsin level  Chronic Hypercapnic and Hypoxemic Respiratory Failure Patient had bipap titration study 11/2022 and she reported benefit from CPAP while in the hospital. ABG 10/06/22 confirms chronic hypercapnia pH 7.33, pCO2 72. Her serum bicarb levels range 33-37 over the past year indicating chronic hypercapnia.  - order NIV with advice on best machine type and settings per RT at DME -Patient continues to exhibit signs of hypercarbia associated with chronic respiratory failure secondary to severe COPD/emphysema.  Patient requires the use of NIV both nightly and daytime to help with exacerbation periods.  The use of NIV will treat the patient's high PCO2 levels and can reduce risk of exacerbations and future hospitalizations when used at night and during the day.    Tobacco Use - recently quit 1-2 months ago - commended her for her efforts  Follow-up in 3 months   ADDENDUM 05/04/2024 Patient does not have diagnosis of sleep apnea. The main etiology of her chronic hypercapnic respiratory failure is COPD. The patient's COPD is stable with no new symptoms or increase in current respiratory symptoms and OSA is not the predominant cause of their hypercapnia.  Dorn Chill, MD [facility] Office: [phone]     Current Outpatient Medications:    albuterol  (PROVENTIL ) (2.5 MG/3ML) 0.083% nebulizer solution, Take 2.5 mg by nebulization every 6 (six) hours as needed for wheezing or shortness of breath., Disp: , Rfl:    albuterol  (VENTOLIN  HFA) 108 (90 Base) MCG/ACT inhaler, Inhale 2 puffs into the lungs every 4 (four) hours as needed for wheezing or shortness of breath., Disp: 18 g, Rfl: 11   atorvastatin  (LIPITOR) 10 MG tablet, Take 10 mg by mouth at bedtime., Disp: , Rfl:    benzonatate  (TESSALON ) 100 MG capsule, Take 2 capsules (200 mg total) by mouth 3 (three)  times daily as needed for cough., Disp: 30 capsule, Rfl: 0   budesonide  (PULMICORT ) 0.5 MG/2ML nebulizer solution, Take 2 mLs (0.5 mg total) by nebulization 2 (two) times daily., Disp: 120 mL, Rfl: 11   busPIRone  (BUSPAR ) 7.5 MG tablet, Take 7.5 mg by mouth 2 (two) times daily., Disp: , Rfl:    enalapril  (VASOTEC ) 10 MG tablet, Take 1 tablet (10 mg total) by mouth daily., Disp: 30 tablet, Rfl: 0   furosemide  (LASIX ) 20 MG tablet, Take 20 mg by mouth daily as needed., Disp: , Rfl:    guaiFENesin  (MUCINEX ) 600 MG 12 hr tablet, Take 1 tablet (600 mg total) by mouth 2 (two) times daily., Disp: 30 tablet, Rfl: 0   hydrOXYzine  (ATARAX ) 25 MG tablet, Take 1 tablet (25 mg total) by mouth 3 (three) times daily as needed for anxiety., Disp: 30 tablet, Rfl: 0   mirtazapine  (REMERON ) 7.5 MG tablet, Take 7.5 mg by mouth  at bedtime., Disp: , Rfl:    Multiple Vitamin (MULTIVITAMIN) capsule, Take 1 capsule by mouth daily with breakfast., Disp: , Rfl:    oxyCODONE -acetaminophen  (PERCOCET) 10-325 MG tablet, Take 1 tablet by mouth 5 (five) times daily as needed for severe pain (pain score 7-10)., Disp: , Rfl:    OXYGEN, Inhale 3 L/min into the lungs as needed (for shortness of breath)., Disp: , Rfl:    polyethylene glycol (MIRALAX  / GLYCOLAX ) 17 g packet, Take 17 g by mouth daily as needed for mild constipation., Disp: 14 each, Rfl: 0   predniSONE  (DELTASONE ) 10 MG tablet, Take 4 tablets (40 mg total) by mouth daily with breakfast for 3 days, THEN 3 tablets (30 mg total) daily with breakfast for 3 days, THEN 2 tablets (20 mg total) daily with breakfast for 3 days, THEN 1 tablet (10 mg total) daily with breakfast for 3 days., Disp: 30 tablet, Rfl: 0   PRESCRIPTION MEDICATION, See admin instructions. CPAP- At bedtime, Disp: , Rfl:    senna-docusate (SENOKOT-S) 8.6-50 MG tablet, Take 1 tablet by mouth 2 (two) times daily., Disp: 30 tablet, Rfl: 0   ipratropium-albuterol  (DUONEB) 0.5-2.5 (3) MG/3ML SOLN, Take 3 mLs by  nebulization every 6 (six) hours., Disp: 360 mL, Rfl: 11

## 2024-04-22 ENCOUNTER — Telehealth: Payer: Self-pay

## 2024-04-22 NOTE — Telephone Encounter (Signed)
 Received referral for new start Ohtuvayre . Opening benefits investigation in this thread.

## 2024-04-22 NOTE — Telephone Encounter (Signed)
 Bipap paperwork recivied by [employer] / Signed / scan back to [employer] by email.

## 2024-04-27 ENCOUNTER — Other Ambulatory Visit (HOSPITAL_BASED_OUTPATIENT_CLINIC_OR_DEPARTMENT_OTHER): Payer: Self-pay | Admitting: Adult Medicine

## 2024-04-27 DIAGNOSIS — Z122 Encounter for screening for malignant neoplasm of respiratory organs: Secondary | ICD-10-CM

## 2024-04-27 DIAGNOSIS — Z1231 Encounter for screening mammogram for malignant neoplasm of breast: Secondary | ICD-10-CM

## 2024-04-28 NOTE — Telephone Encounter (Signed)
 Received Ohtuvayre  new start paperwork. Completed form and faxed with clinicals and insurance card copy to [facility]   Phone#: [phone] Fax#: [phone]

## 2024-04-29 ENCOUNTER — Emergency Department (HOSPITAL_COMMUNITY)
Admission: EM | Admit: 2024-04-29 | Discharge: 2024-04-29 | Disposition: A | Discharge status: Home / Self Care | Attending: Emergency Medicine | Admitting: Emergency Medicine

## 2024-04-29 ENCOUNTER — Emergency Department (HOSPITAL_COMMUNITY)

## 2024-04-29 ENCOUNTER — Other Ambulatory Visit: Payer: Self-pay

## 2024-04-29 ENCOUNTER — Encounter (HOSPITAL_COMMUNITY): Payer: Self-pay

## 2024-04-29 DIAGNOSIS — Z79899 Other long term (current) drug therapy: Secondary | ICD-10-CM | POA: Insufficient documentation

## 2024-04-29 DIAGNOSIS — I10 Essential (primary) hypertension: Secondary | ICD-10-CM | POA: Insufficient documentation

## 2024-04-29 DIAGNOSIS — J449 Chronic obstructive pulmonary disease, unspecified: Secondary | ICD-10-CM | POA: Insufficient documentation

## 2024-04-29 DIAGNOSIS — R600 Localized edema: Secondary | ICD-10-CM | POA: Insufficient documentation

## 2024-04-29 LAB — BASIC METABOLIC PANEL WITH GFR
Anion gap: 8 (ref 5–15)
BUN: 19 mg/dL (ref 8–23)
CO2: 35 mmol/L — ABNORMAL HIGH (ref 22–32)
Calcium: 9.6 mg/dL (ref 8.9–10.3)
Chloride: 101 mmol/L (ref 98–111)
Creatinine, Ser: 0.54 mg/dL (ref 0.44–1.00)
GFR, Estimated: >60 mL/min (ref >60)
Glucose, Bld: 136 mg/dL — ABNORMAL HIGH (ref 70–99)
Potassium: 3.3 mmol/L — ABNORMAL LOW (ref 3.5–5.1)
Sodium: 145 mmol/L (ref 135–145)

## 2024-04-29 LAB — CBC
HCT: 37 % (ref 36.0–46.0)
Hemoglobin: 11.4 g/dL — ABNORMAL LOW (ref 12.0–15.0)
MCH: 30 pg (ref 26.0–34.0)
MCHC: 30.8 g/dL (ref 30.0–36.0)
MCV: 97.4 fL (ref 80.0–100.0)
Platelets: 251 K/uL (ref 150–400)
RBC: 3.8 MIL/uL — ABNORMAL LOW (ref 3.87–5.11)
RDW: 15.9 % — ABNORMAL HIGH (ref 11.5–15.5)
WBC: 8.9 K/uL (ref 4.0–10.5)
nRBC: 0 % (ref 0.0–0.2)

## 2024-04-29 LAB — PRO BRAIN NATRIURETIC PEPTIDE: Pro Brain Natriuretic Peptide: 593 pg/mL — ABNORMAL HIGH (ref <300.0)

## 2024-04-29 LAB — TROPONIN T, HIGH SENSITIVITY: Troponin T High Sensitivity: <15 ng/L (ref 0–19)

## 2024-04-29 MED ORDER — FUROSEMIDE 20 MG PO TABS: 20.0000 mg | ORAL_TABLET | Freq: Every day | ORAL | 0 refills | Status: DC

## 2024-04-29 MED ORDER — FUROSEMIDE 10 MG/ML IJ SOLN
40.0000 mg | Freq: Once | INTRAMUSCULAR | Status: DC
  Filled 2024-04-29: qty 4

## 2024-04-29 MED ORDER — POTASSIUM CHLORIDE CRYS ER 20 MEQ PO TBCR
40.0000 meq | EXTENDED_RELEASE_TABLET | Freq: Once | ORAL | Status: AC
  Administered 2024-04-29: 40 meq via ORAL
  Filled 2024-04-29: qty 2

## 2024-04-29 MED ORDER — IPRATROPIUM-ALBUTEROL 0.5-2.5 (3) MG/3ML IN SOLN
3.0000 mL | Freq: Once | RESPIRATORY_TRACT | Status: AC
  Administered 2024-04-29: 3 mL via RESPIRATORY_TRACT
  Filled 2024-04-29: qty 3

## 2024-04-29 MED ORDER — ENALAPRIL-HYDROCHLOROTHIAZIDE 10-25 MG PO TABS: 1.0000 | ORAL_TABLET | Freq: Every day | ORAL | 0 refills | Status: DC

## 2024-04-29 NOTE — ED Provider Triage Note (Signed)
 Emergency Medicine Provider Triage Evaluation Note  LAURAL EILAND , a 68 y.o. female  was evaluated in triage.  Pt complains of BLE swelling, cp, shob since yesterday. Takes lasix. Last echo 10/11/2023 with normal LVEF  Review of Systems  Positive: See hpi Negative:   Physical Exam  BP (!) 164/101   Pulse 85   Temp 98.1 F (36.7 C) (Oral)   Resp 16   Ht 5' 1 (1.549 m)   Wt 49 kg   SpO2 100%   BMI 20.41 kg/m  Gen:   Awake, no distress   Resp:  Normal effort  MSK:   Moves extremities without difficulty  Other:    Medical Decision Making  Medically screening exam initiated at 6:39 PM.  Appropriate orders placed.  Nerissa E Sedivy was informed that the remainder of the evaluation will be completed by another provider, this initial triage assessment does not replace that evaluation, and the importance of remaining in the ED until their evaluation is complete.  Labs and cxr ordered   Minnie Tinnie FORBES, [place] 04/29/24 1840

## 2024-04-29 NOTE — Telephone Encounter (Signed)
 Received fax from [employer] that insurance information was not legible. Re-faxed insurance information with handwritten information to ensure visibility.

## 2024-04-29 NOTE — ED Provider Notes (Signed)
 [place] EMERGENCY DEPARTMENT AT [facility] Provider Note   CSN: [id number] Arrival date & time: 04/29/24  1759     Patient presents with: Foot Swelling   [name] is a 68 y.o. female with past medical history of DDD, HTN, GERD, COPD, chronic O2 use, HLD presents to Emergency Department for evaluation of bilateral foot swelling for past 2 days.  Wears 3 L O2 at baseline.  Typically uses her inhaler every 4-6 hours for COPD.  Denies chest pain, shortness of breath, fevers, cough, traumatic injury, falls.   HPI     Prior to Admission medications   Medication Sig Start Date End Date Taking? Authorizing Provider  enalapril -hydrochlorothiazide  (VASERETIC ) 10-25 MG tablet Take 1 tablet by mouth daily. 04/29/24  Yes Patt Alm Macho, MD  furosemide (LASIX) 20 MG tablet Take 1 tablet (20 mg total) by mouth daily. 04/29/24  Yes Minnie Tinnie FORBES, PA  albuterol  (PROVENTIL ) (2.5 MG/3ML) 0.083% nebulizer solution Take 2.5 mg by nebulization every 6 (six) hours as needed for wheezing or shortness of breath.    [provider]  albuterol  (VENTOLIN  HFA) 108 (90 Base) MCG/ACT inhaler Inhale 2 puffs into the lungs every 4 (four) hours as needed for wheezing or shortness of breath. 07/24/23   Kara Dorn NOVAK, MD  atorvastatin  (LIPITOR) 10 MG tablet Take 10 mg by mouth at bedtime.    [provider]  benzonatate  (TESSALON ) 100 MG capsule Take 2 capsules (200 mg total) by mouth 3 (three) times daily as needed for cough. 04/12/24   Cheryle Page, MD  budesonide  (PULMICORT ) 0.5 MG/2ML nebulizer solution Take 2 mLs (0.5 mg total) by nebulization 2 (two) times daily. 04/20/24   Kara Dorn NOVAK, MD  busPIRone  (BUSPAR ) 7.5 MG tablet Take 7.5 mg by mouth 2 (two) times daily. 03/26/24   [provider]  enalapril  (VASOTEC ) 10 MG tablet Take 1 tablet (10 mg total) by mouth daily. 01/21/24   [name], MD  furosemide (LASIX) 20 MG tablet Take 20 mg by mouth  daily as needed. 02/03/24   [provider]  guaiFENesin  (MUCINEX ) 600 MG 12 hr tablet Take 1 tablet (600 mg total) by mouth 2 (two) times daily. 04/12/24   Cheryle Page, MD  hydrOXYzine  (ATARAX ) 25 MG tablet Take 1 tablet (25 mg total) by mouth 3 (three) times daily as needed for anxiety. 04/12/24   Cheryle Page, MD  ipratropium-albuterol  (DUONEB) 0.5-2.5 (3) MG/3ML SOLN Take 3 mLs by nebulization every 6 (six) hours. 04/20/24   Kara Dorn NOVAK, MD  mirtazapine  (REMERON ) 7.5 MG tablet Take 7.5 mg by mouth at bedtime. 09/26/23   [provider]  Multiple Vitamin (MULTIVITAMIN) capsule Take 1 capsule by mouth daily with breakfast.    [provider]  oxyCODONE -acetaminophen  (PERCOCET) 10-325 MG tablet Take 1 tablet by mouth 5 (five) times daily as needed for severe pain (pain score 7-10).    [provider]  OXYGEN Inhale 3 L/min into the lungs as needed (for shortness of breath).    [provider]  polyethylene glycol (MIRALAX  / GLYCOLAX ) 17 g packet Take 17 g by mouth daily as needed for mild constipation. 05/27/23   Tobie Yetta HERO, MD  predniSONE  (DELTASONE ) 10 MG tablet Take 4 tablets (40 mg total) by mouth daily with breakfast for 3 days, THEN 3 tablets (30 mg total) daily with breakfast for 3 days, THEN 2 tablets (20 mg total) daily with breakfast for 3 days, THEN 1 tablet (10 mg  total) daily with breakfast for 3 days. 04/20/24 05/02/24  Kara Dorn NOVAK, MD  PRESCRIPTION MEDICATION See admin instructions. CPAP- At bedtime    [provider]  senna-docusate (SENOKOT-S) 8.6-50 MG tablet Take 1 tablet by mouth 2 (two) times daily. 04/12/24   Cheryle Page, MD    Allergies: Patient has no known allergies.    Review of Systems  Musculoskeletal:  Positive for joint swelling.    Updated Vital Signs BP (!) 158/79   Pulse 65   Temp 98.4 F (36.9 C)   Resp 18   Ht 5' 1 (1.549 m)   Wt 49 kg   SpO2 96%   BMI 20.41 kg/m   Physical  Exam Vitals and nursing note reviewed.  Constitutional:      General: [name] is not in acute distress.    Appearance: Normal appearance.  HENT:     Head: Normocephalic and atraumatic.  Eyes:     Conjunctiva/sclera: Conjunctivae normal.  Cardiovascular:     Rate and Rhythm: Normal rate.     Pulses:          Dorsalis pedis pulses are 1+ on the right side and 1+ on the left side.  Pulmonary:     Effort: Pulmonary effort is normal. No respiratory distress.     Comments: On 3 L O2 at baseline.  No signs of respiratory distress.  Maintaining oxygen saturation on her baseline oxygen Musculoskeletal:     Right lower leg: Edema present.     Left lower leg: Edema present.  Feet:     Right foot:     Skin integrity: Skin integrity normal.     Left foot:     Skin integrity: Skin integrity normal.     Comments: Sensation 2/2 of BLE Skin:    Capillary Refill: Capillary refill takes less than 2 seconds.     Coloration: Skin is not jaundiced or pale.  Neurological:     Mental Status: [name] is alert and oriented to person, place, and time. Mental status is at baseline.     (all labs ordered are listed, but only abnormal results are displayed) Labs Reviewed  BASIC METABOLIC PANEL WITH GFR - Abnormal; Notable for the following components:      Result Value   Potassium 3.3 (*)    CO2 35 (*)    Glucose, Bld 136 (*)    All other components within normal limits  CBC - Abnormal; Notable for the following components:   RBC 3.80 (*)    Hemoglobin 11.4 (*)    RDW 15.9 (*)    All other components within normal limits  PRO BRAIN NATRIURETIC PEPTIDE - Abnormal; Notable for the following components:   Pro Brain Natriuretic Peptide 593.0 (*)    All other components within normal limits  TROPONIN T, HIGH SENSITIVITY    EKG: None  Radiology: [facility] Chest Port 1 View Result Date: 04/29/2024 CLINICAL DATA:  Bilateral foot swelling, r/o fluid EXAM: PORTABLE CHEST - 1 VIEW COMPARISON:  April 07, 2024  FINDINGS: Lower lung volumes. Emphysema changes. No focal airspace consolidation, pleural effusion, or pneumothorax. No cardiomegaly. Tortuous aorta with aortic atherosclerosis. No acute fracture or destructive lesions. Multilevel thoracic osteophytosis. IMPRESSION: Low lung volumes. Changes of emphysema. Otherwise, no acute cardiopulmonary abnormality. Electronically Signed   By: Rogelia Myers M.D.   On: 04/29/2024 19:37    Medications Ordered in the ED  ipratropium-albuterol  (DUONEB) 0.5-2.5 (3) MG/3ML nebulizer solution 3 mL (3 mLs Nebulization Given 04/29/24 2229)  potassium chloride  SA (KLOR-CON  M) CR tablet 40 mEq (40 mEq Oral Given 04/29/24 2235)                                    Medical Decision Making Amount and/or Complexity of Data Reviewed Labs: ordered. Radiology: ordered.  Risk Prescription drug management.   Patient presents to the ED for concern of peripheral edema, this involves an extensive number of treatment options, and is a complaint that carries with it a high risk of complications and morbidity.  The differential diagnosis includes fluid overload, ACS, infection, DVT, hypoxia, COPD exacerbation   Co morbidities that complicate the patient evaluation  COPD, O2 supplementation at baseline.  See HPI   Additional history obtained:  Additional history obtained from Nursing and Outside Medical Records   External records from outside source obtained and reviewed including triage RN note, last echo 2025   Lab Tests:  I Ordered, and personally interpreted labs.  The pertinent results include:   Troponin negative BNP 593 Potassium 3.2 CBG 136 Hgb 11.4   Imaging Studies ordered:  I ordered imaging studies including chest x-ray I independently visualized and interpreted imaging which showed low lung volumes, emphysema.  Otherwise, no acute cardiopulmonary pathology I agree with the radiologist interpretation   Cardiac Monitoring:  The patient was  maintained on a cardiac monitor.  I personally viewed and interpreted the cardiac monitored which showed an underlying rhythm of: NSR wo ischemic changes   Medicines ordered and prescription drug management:  I ordered prescription medication including lasix , Vaseretic   for peripheral edema, HTN  I have reviewed the patients home medicines and have made adjustments as needed    Problem List / ED Course:  Peripheral edema Patient does have 2+ edema bilaterally.  Is prescribed Lasix  20 mg however reports that [name] does not take it daily as it makes me pee too much. Last echo 10/11/2023 with LVEF 60-65% In triage, patient reports intermittent chest pain, shortness of breath that started yesterday.  Nonexertional.  However, when I asked her about this in room, [name] denies ever having chest pain, shortness of breath.  EKG without ischemic changes.  BNP 593.  Troponin negative.  Chest x-ray without effusion.  Maintaining oxygen saturation without additional supplementation.  [name] is at baseline oxygen requirement of 3 L O2.  Low suspicion for fluid overload, demand ischemia, nor ACS Did provide patient with small DuoNeb as there are no signs of fluid overload and [name] typically takes her nebulizer treatment at home every 4-6 hours and [name] missed her dose this evening.  Had patient ambulate around Emergency Department with no complaint of chest pain, shortness of breath.  [name] remained on her O2 of 3 L at baseline with no need to increase.  No desaturation.  See nursing note. Had extensive conversation with patient regarding importance of taking Lasix  daily.  [name] likely is experiencing this edema as [name] is not taking it consistently.  I discussed the importance of increased urination as a side effect of Lasix  as this is how we get the fluid out of the legs and if we do not that it may cause pulmonary edema, shortness of breath, chest pain.  [name] expresses understanding and will be more compliant with  Lasix . Also has been on Veseretec in the past which could help with fluid overload as well as HCTZ.  [name] is mildly hypertensive at 158/78 and would  benefit from this prescription Low suspicion of PE/DVT as there is no calf tenderness nor Homans' sign.  No complaints of chest pain no shortness of breath.  No tachycardia nor hypotension Low suspicion for cellulitis as there is no erythema, warmth, drainage from legs. Low suspicion for vascular injury or ischemic limb as pulses equally palpated 1+ bilaterally. Discussed patient should follow-up with primary care provider for medication management,   Reevaluation:  After the interventions noted above, I reevaluated the patient and found that they have :stayed the same     Dispostion:  After consideration of the diagnostic results and the patients response to treatment, I feel that the patent would benefit from outpatient management.   Discussed ED workup, disposition, return to ED precautions with patient who expresses understanding agrees with plan.  All questions answered to their satisfaction.  They are agreeable to plan.  Discharge instructions provided on paperwork  Dr. Patt individually assessed patient, reviewed ED workup and agrees with plan   Final diagnoses:  Peripheral edema    ED Discharge Orders          Ordered    enalapril -hydrochlorothiazide  (VASERETIC ) 10-25 MG tablet  Daily        04/29/24 2208    furosemide (LASIX) 20 MG tablet  Daily        04/29/24 2218             Minnie Tinnie BRAVO, PA 04/30/24 0002    Patt Alm Macho, MD 04/30/24 [phone]

## 2024-04-29 NOTE — ED Notes (Signed)
Extra blue top sent to lab.

## 2024-04-29 NOTE — ED Triage Notes (Signed)
 Patient has noticed bilateral foot swelling for 2 days. Denies heart or kidney problems. Wears 3L [place] baseline. Denies swelling anywhere else.

## 2024-04-29 NOTE — Discharge Instructions (Addendum)
 Thank you for letting us  evaluate you today. I have refilled your lasix and blood pressure medicine. Take as prescribed. Do not miss a dose. Take in morning cause lasix will make you urinate but it allows for fluid to not go into lungs or feet. You may elevate feet to reduce swelling  Return to ED if you experience chest pain, shortness of breath, worsening symtpoms

## 2024-04-30 NOTE — Telephone Encounter (Signed)
 Received fax from [employer] with summary of benefits. Referral form for [place]  received. Rx will be triaged to [facility] (phone: [phone]). Once benefits investigation completed, pharmacy will reach out the patient to schedule shipment. If medication is unaffordable, patient will need to express financial hardship to be referred back to [facility] for patient assistance program pre-screening.   Patient ID: [id number] Pharmacy phone: [facility] (phone: [phone]) [facility] Phone#: [phone]

## 2024-04-30 NOTE — Telephone Encounter (Signed)
 NFN

## 2024-05-01 NOTE — Telephone Encounter (Signed)
 Received fax from [facility] confirming prescription was received.

## 2024-05-19 ENCOUNTER — Telehealth: Payer: Self-pay

## 2024-05-19 NOTE — Telephone Encounter (Signed)
 Received order from DME was signed and faxed back 05/18/24  - it will take a few business days to process and with the up coming holidays     Copied from CRM #[id number]. Topic: Clinical - Order For Equipment >> May 18, 2024  4:22 PM Devaughn RAMAN wrote: Reason for CRM: Pt calling in regards to order for Bipap machine to [employer], pt stated she spoke with [employer] and they advised they do not have an order for the pt.

## 2024-06-01 DIAGNOSIS — J439 Emphysema, unspecified: Secondary | ICD-10-CM

## 2024-06-01 NOTE — Telephone Encounter (Signed)
 Pt requesting refill of specialty medication - routing to Rx team to advise.

## 2024-06-05 ENCOUNTER — Other Ambulatory Visit: Payer: Self-pay

## 2024-06-05 ENCOUNTER — Emergency Department (HOSPITAL_COMMUNITY)

## 2024-06-05 ENCOUNTER — Emergency Department (HOSPITAL_COMMUNITY)
Admission: EM | Admit: 2024-06-05 | Discharge: 2024-06-05 | Disposition: A | Discharge status: Home / Self Care | Attending: Emergency Medicine | Admitting: Emergency Medicine

## 2024-06-05 ENCOUNTER — Encounter (HOSPITAL_COMMUNITY): Payer: Self-pay

## 2024-06-05 DIAGNOSIS — J441 Chronic obstructive pulmonary disease with (acute) exacerbation: Secondary | ICD-10-CM | POA: Insufficient documentation

## 2024-06-05 DIAGNOSIS — D649 Anemia, unspecified: Secondary | ICD-10-CM | POA: Diagnosis not present

## 2024-06-05 DIAGNOSIS — R739 Hyperglycemia, unspecified: Secondary | ICD-10-CM | POA: Diagnosis not present

## 2024-06-05 DIAGNOSIS — R0602 Shortness of breath: Secondary | ICD-10-CM | POA: Diagnosis present

## 2024-06-05 DIAGNOSIS — I10 Essential (primary) hypertension: Secondary | ICD-10-CM | POA: Insufficient documentation

## 2024-06-05 DIAGNOSIS — Z79899 Other long term (current) drug therapy: Secondary | ICD-10-CM | POA: Diagnosis not present

## 2024-06-05 LAB — CBC WITH DIFFERENTIAL/PLATELET
Abs Immature Granulocytes: 0.01 K/uL (ref 0.00–0.07)
Basophils Absolute: 0 K/uL (ref 0.0–0.1)
Basophils Relative: 0 %
Eosinophils Absolute: 0.2 K/uL (ref 0.0–0.5)
Eosinophils Relative: 3 %
HCT: 38.1 % (ref 36.0–46.0)
Hemoglobin: 11.4 g/dL — ABNORMAL LOW (ref 12.0–15.0)
Immature Granulocytes: 0 %
Lymphocytes Relative: 43 %
Lymphs Abs: 2.6 K/uL (ref 0.7–4.0)
MCH: 29.7 pg (ref 26.0–34.0)
MCHC: 29.9 g/dL — ABNORMAL LOW (ref 30.0–36.0)
MCV: 99.2 fL (ref 80.0–100.0)
Monocytes Absolute: 0.5 K/uL (ref 0.1–1.0)
Monocytes Relative: 9 %
Neutro Abs: 2.7 K/uL (ref 1.7–7.7)
Neutrophils Relative %: 45 %
Platelets: 244 K/uL (ref 150–400)
RBC: 3.84 MIL/uL — ABNORMAL LOW (ref 3.87–5.11)
RDW: 13.9 % (ref 11.5–15.5)
WBC: 5.9 K/uL (ref 4.0–10.5)
nRBC: 0 % (ref 0.0–0.2)

## 2024-06-05 LAB — PRO BRAIN NATRIURETIC PEPTIDE: Pro Brain Natriuretic Peptide: 179 pg/mL

## 2024-06-05 LAB — BASIC METABOLIC PANEL WITH GFR
Anion gap: 7 (ref 5–15)
BUN: 11 mg/dL (ref 8–23)
CO2: 34 mmol/L — ABNORMAL HIGH (ref 22–32)
Calcium: 9.5 mg/dL (ref 8.9–10.3)
Chloride: 104 mmol/L (ref 98–111)
Creatinine, Ser: 0.58 mg/dL (ref 0.44–1.00)
GFR, Estimated: >60 mL/min
Glucose, Bld: 153 mg/dL — ABNORMAL HIGH (ref 70–99)
Potassium: 3.8 mmol/L (ref 3.5–5.1)
Sodium: 145 mmol/L (ref 135–145)

## 2024-06-05 LAB — TROPONIN T, HIGH SENSITIVITY: Troponin T High Sensitivity: <15 ng/L (ref 0–19)

## 2024-06-05 MED ORDER — PREDNISONE 50 MG PO TABS: 50.0000 mg | ORAL_TABLET | Freq: Every day | ORAL | 0 refills | Status: DC

## 2024-06-05 MED ORDER — OXYCODONE-ACETAMINOPHEN 5-325 MG PO TABS
2.0000 | ORAL_TABLET | Freq: Once | ORAL | Status: AC
  Administered 2024-06-05: 2 via ORAL
  Filled 2024-06-05: qty 2

## 2024-06-05 NOTE — Discharge Instructions (Signed)
 Continue using your home nebulizer every 4 hours as needed.  If your home nebulizer is not giving you sufficient relief, you need to come to the emergency department to be reevaluated.  Please make sure to see your primary care provider next week.  He may choose to give you a tapering dose of the prednisone  after you complete the course that I have prescribed.

## 2024-06-05 NOTE — ED Triage Notes (Signed)
 Arrives [facility] for shortness of breath.   Upon paramedic arrival pt had silent lower lobes and rhonchi in upper.   Albuterol  administered enroute but pt not very happy to be receiving it.

## 2024-06-05 NOTE — ED Provider Notes (Signed)
 " [place] EMERGENCY DEPARTMENT AT [facility] Provider Note   CSN: [id number] Arrival date & time: 06/05/24  0444     Patient presents with: Shortness of Breath   [name] is a 69 y.o. female.   The history is provided by the patient.  Shortness of Breath  She has history of hypertension, COPD and comes in because of difficulty breathing for the last week.  She states that she has a home nebulizer, but it has not been helping her.  She has had EMS to the house several times but refused transport because she was afraid she would be admitted.  As she has had a cough productive of a small amount of clear sputum.  She denies fever, chills, sweats.  She has had some mild, intermittent aching pain in the left side of her chest.  She denies nausea or vomiting and denies diaphoresis.  She arrived by ambulance and had received albuterol  and ipratropium via nebulizer and also intravenous methylprednisolone  en route.    Prior to Admission medications  Medication Sig Start Date End Date Taking? Authorizing Provider  albuterol  (PROVENTIL ) (2.5 MG/3ML) 0.083% nebulizer solution Take 2.5 mg by nebulization every 6 (six) hours as needed for wheezing or shortness of breath.    [provider]  albuterol  (VENTOLIN  HFA) 108 (90 Base) MCG/ACT inhaler Inhale 2 puffs into the lungs every 4 (four) hours as needed for wheezing or shortness of breath. 07/24/23   Kara Dorn NOVAK, MD  atorvastatin  (LIPITOR) 10 MG tablet Take 10 mg by mouth at bedtime.    [provider]  benzonatate  (TESSALON ) 100 MG capsule Take 2 capsules (200 mg total) by mouth 3 (three) times daily as needed for cough. 04/12/24   Cheryle Page, MD  budesonide  (PULMICORT ) 0.5 MG/2ML nebulizer solution Take 2 mLs (0.5 mg total) by nebulization 2 (two) times daily. 04/20/24   Kara Dorn NOVAK, MD  busPIRone  (BUSPAR ) 7.5 MG tablet Take 7.5 mg by mouth 2 (two) times daily. 03/26/24   [provider]   enalapril  (VASOTEC ) 10 MG tablet Take 1 tablet (10 mg total) by mouth daily. 01/21/24   [name], MD  enalapril -hydrochlorothiazide  (VASERETIC ) 10-25 MG tablet Take 1 tablet by mouth daily. 04/29/24   Patt Alm Macho, MD  Ensifentrine  (OHTUVAYRE ) 3 MG/2.5ML SUSP Inhale one vial by nebulizer twice daily. Shake well before use. Do not mix with other nebulized medications. 06/01/24   Kara Dorn NOVAK, MD  furosemide  (LASIX ) 20 MG tablet Take 20 mg by mouth daily as needed. 02/03/24   [provider]  furosemide  (LASIX ) 20 MG tablet Take 1 tablet (20 mg total) by mouth daily. 04/29/24   Minnie Tinnie FORBES, PA  guaiFENesin  (MUCINEX ) 600 MG 12 hr tablet Take 1 tablet (600 mg total) by mouth 2 (two) times daily. 04/12/24   Cheryle Page, MD  hydrOXYzine  (ATARAX ) 25 MG tablet Take 1 tablet (25 mg total) by mouth 3 (three) times daily as needed for anxiety. 04/12/24   Cheryle Page, MD  ipratropium-albuterol  (DUONEB) 0.5-2.5 (3) MG/3ML SOLN Take 3 mLs by nebulization every 6 (six) hours. 04/20/24   Kara Dorn NOVAK, MD  mirtazapine  (REMERON ) 7.5 MG tablet Take 7.5 mg by mouth at bedtime. 09/26/23   [provider]  Multiple Vitamin (MULTIVITAMIN) capsule Take 1 capsule by mouth daily with breakfast.    [provider]  oxyCODONE -acetaminophen  (PERCOCET) 10-325 MG tablet Take 1 tablet by mouth 5 (five) times daily as needed for severe pain (  pain score 7-10).    [provider]  OXYGEN Inhale 3 L/min into the lungs as needed (for shortness of breath).    [provider]  polyethylene glycol (MIRALAX  / GLYCOLAX ) 17 g packet Take 17 g by mouth daily as needed for mild constipation. 05/27/23   Tobie Yetta HERO, MD  PRESCRIPTION MEDICATION See admin instructions. CPAP- At bedtime    [provider]  senna-docusate (SENOKOT-S) 8.6-50 MG tablet Take 1 tablet by mouth 2 (two) times daily. 04/12/24   Cheryle Page, MD    Allergies: Patient has no known  allergies.    Review of Systems  Respiratory:  Positive for shortness of breath.   All other systems reviewed and are negative.   Updated Vital Signs BP (!) 130/94   Pulse 82   Temp 97.8 F (36.6 C) (Oral)   Resp (!) 22   SpO2 100%   Physical Exam Vitals and nursing note reviewed.   69 year old female, resting comfortably and in no acute distress. Vital signs are significant for elevated blood pressure and respiratory rate. Oxygen saturation is 100%, which is normal. Head is normocephalic and atraumatic. PERRLA, EOMI.  Lungs have decreased airflow diffusely, few rales at the bases, diffuse expiratory wheezes. Chest is nontender. Heart has regular rate and rhythm without murmur. Abdomen is soft, flat, nontender. Extremities have 1+ pedalr edema. Skin is warm and dry without rash. Neurologic: Mental status is normal, cranial nerves are intact, moves all extremities equally.  (all labs ordered are listed, but only abnormal results are displayed) Labs Reviewed  BASIC METABOLIC PANEL WITH GFR - Abnormal; Notable for the following components:      Result Value   CO2 34 (*)    Glucose, Bld 153 (*)    All other components within normal limits  CBC WITH DIFFERENTIAL/PLATELET - Abnormal; Notable for the following components:   RBC 3.84 (*)    Hemoglobin 11.4 (*)    MCHC 29.9 (*)    All other components within normal limits  PRO BRAIN NATRIURETIC PEPTIDE  TROPONIN T, HIGH SENSITIVITY    EKG: EKG Interpretation Date/Time:  Friday June 05 2024 05:03:25 EST Ventricular Rate:  86 PR Interval:  150 QRS Duration:  89 QT Interval:  354 QTC Calculation: 424 R Axis:   88  Text Interpretation: Sinus rhythm Atrial premature complexes Borderline right axis deviation Probable anteroseptal infarct, old When compared with ECG of 04/29/2024, No significant change was found Confirmed by Raford Lenis ([id number]) on 06/05/2024 5:27:14 AM  Radiology: [facility] Chest Port 1 View Result Date:  06/05/2024 EXAM: 1 VIEW XRAY OF THE CHEST 06/05/2024 05:33:00 AM COMPARISON: 04/29/2024 CLINICAL HISTORY: 69 year old female with FINDINGS: LUNGS AND PLEURA: Hazy left lingular opacity is chronic. No acute opacity. No pleural effusion. No pneumothorax. HEART AND MEDIASTINUM: Calcified aorta. No acute abnormality of the cardiac and mediastinal silhouettes. BONES AND SOFT TISSUES: Thoracic degenerative changes. No acute osseous abnormality. IMPRESSION: 1. Emphysema. No acute cardiopulmonary abnormality. Electronically signed by: Helayne Hurst MD MD 06/05/2024 05:41 AM EST RP Workstation: [id number]     Procedures   Medications Ordered in the ED  oxyCODONE -acetaminophen  (PERCOCET/ROXICET) 5-325 MG per tablet 2 tablet (2 tablets Oral Given 06/05/24 0535)                                    Medical Decision Making Amount and/or Complexity of Data Reviewed Labs: ordered. Radiology: ordered.  Risk Prescription drug management.   COPD exacerbation.  I have reviewed her past records, and note hospitalization 04/07/2024-04/12/2024 for COPD exacerbation, ED visit on 04/29/2024 for peripheral edema.  I have ordered additional albuterol  with ipratropium as well as chest x-ray and screening labs.  Chest x-ray shows emphysema but no evidence of pneumonia.  I have independently viewed the image, and agree with radiologist's interpretation.  I have reviewed her laboratory tests, and my interpretation is elevated CO2 consistent with longstanding COPD, elevated random glucose level, stable anemia.  BNP is normal whereas it had been elevated on 12/3, troponin is normal.  Following above-noted treatment, patient states that she feels like she is back to normal and lungs are completely clear.  She is concerned about her peripheral edema.  BNP is decreasing, I feel that she can manage it with as needed furosemide .     Final diagnoses:  COPD exacerbation (HCC)  Elevated random blood glucose level  Normochromic  normocytic anemia    ED Discharge Orders     None          Raford Lenis, MD 06/05/24 [phone]  "

## 2024-06-19 ENCOUNTER — Ambulatory Visit

## 2024-06-25 ENCOUNTER — Emergency Department (HOSPITAL_COMMUNITY)

## 2024-06-25 ENCOUNTER — Observation Stay (HOSPITAL_COMMUNITY)
Admission: EM | Admit: 2024-06-25 | Discharge: 2024-06-25 | Disposition: A | Discharge status: Home / Self Care | Source: Home / Self Care | Attending: Emergency Medicine | Admitting: Emergency Medicine

## 2024-06-25 ENCOUNTER — Encounter (HOSPITAL_COMMUNITY): Payer: Self-pay | Admitting: Internal Medicine

## 2024-06-25 DIAGNOSIS — I10 Essential (primary) hypertension: Secondary | ICD-10-CM | POA: Diagnosis not present

## 2024-06-25 DIAGNOSIS — J441 Chronic obstructive pulmonary disease with (acute) exacerbation: Principal | ICD-10-CM | POA: Insufficient documentation

## 2024-06-25 DIAGNOSIS — R0603 Acute respiratory distress: Secondary | ICD-10-CM | POA: Diagnosis present

## 2024-06-25 DIAGNOSIS — Z5329 Procedure and treatment not carried out because of patient's decision for other reasons: Secondary | ICD-10-CM | POA: Insufficient documentation

## 2024-06-25 LAB — COMPREHENSIVE METABOLIC PANEL WITH GFR
ALT: 11 U/L (ref 0–44)
AST: 22 U/L (ref 15–41)
Albumin: 4 g/dL (ref 3.5–5.0)
Alkaline Phosphatase: 45 U/L (ref 38–126)
Anion gap: 6 (ref 5–15)
BUN: 9 mg/dL (ref 8–23)
CO2: 34 mmol/L — ABNORMAL HIGH (ref 22–32)
Calcium: 8.9 mg/dL (ref 8.9–10.3)
Chloride: 103 mmol/L (ref 98–111)
Creatinine, Ser: 0.54 mg/dL (ref 0.44–1.00)
GFR, Estimated: >60 mL/min
Glucose, Bld: 102 mg/dL — ABNORMAL HIGH (ref 70–99)
Potassium: 3.6 mmol/L (ref 3.5–5.1)
Sodium: 143 mmol/L (ref 135–145)
Total Bilirubin: 0.4 mg/dL (ref 0.0–1.2)
Total Protein: 5.9 g/dL — ABNORMAL LOW (ref 6.5–8.1)

## 2024-06-25 LAB — CBC WITH DIFFERENTIAL/PLATELET
Abs Immature Granulocytes: 0.01 10*3/uL (ref 0.00–0.07)
Basophils Absolute: 0 10*3/uL (ref 0.0–0.1)
Basophils Relative: 1 %
Eosinophils Absolute: 0.1 10*3/uL (ref 0.0–0.5)
Eosinophils Relative: 2 %
HCT: 38.2 % (ref 36.0–46.0)
Hemoglobin: 10.9 g/dL — ABNORMAL LOW (ref 12.0–15.0)
Immature Granulocytes: 0 %
Lymphocytes Relative: 32 %
Lymphs Abs: 1.6 10*3/uL (ref 0.7–4.0)
MCH: 29 pg (ref 26.0–34.0)
MCHC: 28.5 g/dL — ABNORMAL LOW (ref 30.0–36.0)
MCV: 101.6 fL — ABNORMAL HIGH (ref 80.0–100.0)
Monocytes Absolute: 0.4 10*3/uL (ref 0.1–1.0)
Monocytes Relative: 7 %
Neutro Abs: 3 10*3/uL (ref 1.7–7.7)
Neutrophils Relative %: 58 %
Platelets: 269 10*3/uL (ref 150–400)
RBC: 3.76 MIL/uL — ABNORMAL LOW (ref 3.87–5.11)
RDW: 13.8 % (ref 11.5–15.5)
WBC: 5.1 10*3/uL (ref 4.0–10.5)
nRBC: 0 % (ref 0.0–0.2)

## 2024-06-25 LAB — RESP PANEL BY RT-PCR (RSV, FLU A&B, COVID)  RVPGX2
Influenza A by PCR: NEGATIVE
Influenza B by PCR: NEGATIVE
Resp Syncytial Virus by PCR: NEGATIVE
SARS Coronavirus 2 by RT PCR: NEGATIVE

## 2024-06-25 LAB — BLOOD GAS, VENOUS
Acid-Base Excess: 10.6 mmol/L — ABNORMAL HIGH (ref 0.0–2.0)
Bicarbonate: 39.2 mmol/L — ABNORMAL HIGH (ref 20.0–28.0)
O2 Saturation: 48.6 %
Patient temperature: 37
pCO2, Ven: 71 mmHg (ref 44–60)
pH, Ven: 7.35 (ref 7.25–7.43)
pO2, Ven: <31 mmHg — CL (ref 32–45)

## 2024-06-25 MED ORDER — OXYCODONE-ACETAMINOPHEN 5-325 MG PO TABS
1.0000 | ORAL_TABLET | Freq: Once | ORAL | Status: AC
  Administered 2024-06-25: 1 via ORAL
  Filled 2024-06-25: qty 1

## 2024-06-25 MED ORDER — PREDNISONE 50 MG PO TABS: 50.0000 mg | ORAL_TABLET | Freq: Every day | ORAL | 0 refills | Status: DC

## 2024-06-25 MED ORDER — IPRATROPIUM-ALBUTEROL 0.5-2.5 (3) MG/3ML IN SOLN: 3.0000 mL | Freq: Once | RESPIRATORY_TRACT | Status: DC

## 2024-06-25 NOTE — ED Provider Notes (Signed)
 " [place] EMERGENCY DEPARTMENT AT [facility] Provider Note   CSN: [id number] Arrival date & time: 06/25/24  1333     Patient presents with: Respiratory Distress   [name] is a 69 y.o. female.   Patient has COPD and hypertension.  Patient is on 3 L at home.  She states she has been coughing and short of breath and wheezing for the last 3 to 4 days.  Mild yellow sputum production.  When paramedics arrived she was very dyspneic but she was not on her oxygen and her room air sats within the 70s  The history is provided by the patient and medical records. No language interpreter was used.  Shortness of Breath Severity:  Moderate Onset quality:  Sudden Timing:  Constant Progression:  Worsening Chronicity:  Recurrent Context: activity   Relieved by:  Nothing Worsened by:  Nothing Associated symptoms: wheezing   Associated symptoms: no abdominal pain, no chest pain, no cough, no headaches and no rash        Prior to Admission medications  Medication Sig Start Date End Date Taking? Authorizing Provider  albuterol  (PROVENTIL ) (2.5 MG/3ML) 0.083% nebulizer solution Take 2.5 mg by nebulization every 6 (six) hours as needed for wheezing or shortness of breath.    [provider]  albuterol  (VENTOLIN  HFA) 108 (90 Base) MCG/ACT inhaler Inhale 2 puffs into the lungs every 4 (four) hours as needed for wheezing or shortness of breath. 07/24/23   Kara Dorn NOVAK, MD  atorvastatin  (LIPITOR) 10 MG tablet Take 10 mg by mouth at bedtime.    [provider]  benzonatate  (TESSALON ) 100 MG capsule Take 2 capsules (200 mg total) by mouth 3 (three) times daily as needed for cough. 04/12/24   Cheryle Page, MD  budesonide  (PULMICORT ) 0.5 MG/2ML nebulizer solution Take 2 mLs (0.5 mg total) by nebulization 2 (two) times daily. 04/20/24   Kara Dorn NOVAK, MD  busPIRone  (BUSPAR ) 7.5 MG tablet Take 7.5 mg by mouth 2 (two) times daily. 03/26/24   [provider]  enalapril  (VASOTEC ) 10 MG tablet Take 1 tablet (10 mg total) by mouth daily. 01/21/24   [name], MD  enalapril -hydrochlorothiazide  (VASERETIC ) 10-25 MG tablet Take 1 tablet by mouth daily. 04/29/24   Patt Alm Macho, MD  Ensifentrine  (OHTUVAYRE ) 3 MG/2.5ML SUSP Inhale one vial by nebulizer twice daily. Shake well before use. Do not mix with other nebulized medications. 06/01/24   Kara Dorn NOVAK, MD  furosemide  (LASIX ) 20 MG tablet Take 20 mg by mouth daily as needed. 02/03/24   [provider]  furosemide  (LASIX ) 20 MG tablet Take 1 tablet (20 mg total) by mouth daily. 04/29/24   Minnie Tinnie FORBES, PA  guaiFENesin  (MUCINEX ) 600 MG 12 hr tablet Take 1 tablet (600 mg total) by mouth 2 (two) times daily. 04/12/24   Cheryle Page, MD  hydrOXYzine  (ATARAX ) 25 MG tablet Take 1 tablet (25 mg total) by mouth 3 (three) times daily as needed for anxiety. 04/12/24   Cheryle Page, MD  ipratropium-albuterol  (DUONEB) 0.5-2.5 (3) MG/3ML SOLN Take 3 mLs by nebulization every 6 (six) hours. 04/20/24   Kara Dorn NOVAK, MD  mirtazapine  (REMERON ) 7.5 MG tablet Take 7.5 mg by mouth at bedtime. 09/26/23   [provider]  Multiple Vitamin (MULTIVITAMIN) capsule Take 1 capsule by mouth daily with breakfast.    [provider]  oxyCODONE -acetaminophen  (PERCOCET) 10-325 MG tablet Take 1 tablet by mouth 5 (five) times daily as needed for severe  pain (pain score 7-10).    [provider]  OXYGEN Inhale 3 L/min into the lungs as needed (for shortness of breath).    [provider]  polyethylene glycol (MIRALAX  / GLYCOLAX ) 17 g packet Take 17 g by mouth daily as needed for mild constipation. 05/27/23   Tobie Yetta HERO, MD  predniSONE  (DELTASONE ) 50 MG tablet Take 1 tablet (50 mg total) by mouth daily. 06/05/24   Raford Lenis, MD  PRESCRIPTION MEDICATION See admin instructions. CPAP- At bedtime    [provider]  senna-docusate (SENOKOT-S) 8.6-50 MG tablet Take 1  tablet by mouth 2 (two) times daily. 04/12/24   Cheryle Page, MD    Allergies: Patient has no known allergies.    Review of Systems  Constitutional:  Negative for appetite change and fatigue.  HENT:  Negative for congestion, ear discharge and sinus pressure.   Eyes:  Negative for discharge.  Respiratory:  Positive for shortness of breath and wheezing. Negative for cough.   Cardiovascular:  Negative for chest pain.  Gastrointestinal:  Negative for abdominal pain and diarrhea.  Genitourinary:  Negative for frequency and hematuria.  Musculoskeletal:  Negative for back pain.  Skin:  Negative for rash.  Neurological:  Negative for seizures and headaches.  Psychiatric/Behavioral:  Negative for hallucinations.     Updated Vital Signs BP (!) 114/98 (BP Location: Left Arm)   Pulse 100   Temp 98.5 F (36.9 C) (Oral)   Resp (!) 22   SpO2 100%   Physical Exam Vitals and nursing note reviewed.  Constitutional:      Appearance: She is well-developed.  HENT:     Head: Normocephalic.     Nose: Nose normal.  Eyes:     General: No scleral icterus.    Conjunctiva/sclera: Conjunctivae normal.  Neck:     Thyroid : No thyromegaly.  Cardiovascular:     Rate and Rhythm: Normal rate and regular rhythm.     Heart sounds: No murmur heard.    No friction rub. No gallop.  Pulmonary:     Breath sounds: No stridor. Wheezing present. No rales.  Chest:     Chest wall: No tenderness.  Abdominal:     General: There is no distension.     Tenderness: There is no abdominal tenderness. There is no rebound.  Musculoskeletal:        General: Normal range of motion.     Cervical back: Neck supple.  Lymphadenopathy:     Cervical: No cervical adenopathy.  Skin:    Findings: No erythema or rash.  Neurological:     Mental Status: She is alert and oriented to person, place, and time.     Motor: No abnormal muscle tone.     Coordination: Coordination normal.  Psychiatric:        Behavior: Behavior  normal.     (all labs ordered are listed, but only abnormal results are displayed) Labs Reviewed  CBC WITH DIFFERENTIAL/PLATELET - Abnormal; Notable for the following components:      Result Value   RBC 3.76 (*)    Hemoglobin 10.9 (*)    MCV 101.6 (*)    MCHC 28.5 (*)    All other components within normal limits  COMPREHENSIVE METABOLIC PANEL WITH GFR - Abnormal; Notable for the following components:   CO2 34 (*)    Glucose, Bld 102 (*)    Total Protein 5.9 (*)    All other components within normal limits  BLOOD GAS, VENOUS - Abnormal;  Notable for the following components:   pCO2, Ven 71 (*)    pO2, Ven <31 (*)    Bicarbonate 39.2 (*)    Acid-Base Excess 10.6 (*)    All other components within normal limits  RESP PANEL BY RT-PCR (RSV, FLU A&B, COVID)  [id number]    EKG: None  Radiology: [facility] Chest Port 1 View Result Date: 06/25/2024 CLINICAL DATA:  Shortness of breath. EXAM: PORTABLE CHEST 1 VIEW COMPARISON:  Chest radiograph dated 06/05/2024. FINDINGS: No focal consolidation, pleural effusion, pneumothorax. The cardiac silhouette is within normal limits. No acute osseous pathology. IMPRESSION: No active disease. Electronically Signed   By: Vanetta Chou M.D.   On: 06/25/2024 15:12     Procedures   Medications Ordered in the ED  ipratropium-albuterol  (DUONEB) 0.5-2.5 (3) MG/3ML nebulizer solution 3 mL (has no administration in time range)  oxyCODONE -acetaminophen  (PERCOCET/ROXICET) 5-325 MG per tablet 1 tablet (1 tablet Oral Given 06/25/24 1411)                                    Medical Decision Making Amount and/or Complexity of Data Reviewed Labs: ordered. Radiology: ordered.  Risk Prescription drug management.   Patient with COPD exacerbation.  Patient decided to leave AMA     Final diagnoses:  None    ED Discharge Orders     None          Suzette Pac, MD 06/27/24 1840  "

## 2024-06-25 NOTE — ED Notes (Signed)
 IV removed, pt stating she is leaving, not wanting to stay overnight. Writer explained if her condition changes to please return to the ED. She verbalizes understanding.

## 2024-06-25 NOTE — Consult Note (Signed)
 Patient is a 69 years old female with past medical history of hypertension, anxiety, asthma, COPD on 3 L of oxygen by nasal cannula baseline presented to hospital with difficulty breathing for 1 week despite using her nebulizers at home.  EMS had come to the house multiple times but she had refused admission and coming to the hospital but due to persistent symptoms she was brought to the hospital listed.  Patient denied any fever chills nausea vomiting abdominal pain but has been having some chest pain from coughing.    In the ED patient had hemoglobin of 11.4.  Venous gas with pCO2 51 and pO2 of 31 EKG showed normal sinus rhythm.  Chest x-ray with findings of emphysema without any infiltrate.  Patient received oxycodone , DuoNeb in the ED and was considered for admission to the hospital for COPD exacerbation.  COVID influenza and RSV was pending. At this time she is on baseline oxygen requirement and is not on obvious respiratory distress.  Advised prednisone  to the ED provider.    ED provider spoke with the patient who initially agreed on staying overnight in observation for COPD exacerbation but during my interview she is persistently refusing to get admitted. Information relayed to ED provider.

## 2024-06-25 NOTE — ED Triage Notes (Signed)
 Pt BIBA from PCP office for dyspnea with exertion.  Pt 72% on room air w/EMS, rebounded with ox.  Pt endorses shortness of breath x2 days.    10mg  albuterol  1mg  atrovent  125 solumedrol 4 g mag  155/82 68 100% on NRB

## 2024-06-25 NOTE — Discharge Instructions (Signed)
 Follow-up with your doctor next week.  Return if needed

## 2024-06-30 ENCOUNTER — Emergency Department (HOSPITAL_COMMUNITY)

## 2024-06-30 ENCOUNTER — Observation Stay (HOSPITAL_COMMUNITY)
Admission: EM | Admit: 2024-06-30 | Discharge: 2024-07-02 | DRG: 191 | Disposition: A | Attending: Family Medicine | Admitting: Family Medicine

## 2024-06-30 ENCOUNTER — Ambulatory Visit

## 2024-06-30 ENCOUNTER — Other Ambulatory Visit: Payer: Self-pay

## 2024-06-30 ENCOUNTER — Encounter (HOSPITAL_COMMUNITY): Payer: Self-pay

## 2024-06-30 DIAGNOSIS — Z8249 Family history of ischemic heart disease and other diseases of the circulatory system: Secondary | ICD-10-CM

## 2024-06-30 DIAGNOSIS — Z9981 Dependence on supplemental oxygen: Secondary | ICD-10-CM

## 2024-06-30 DIAGNOSIS — Z7951 Long term (current) use of inhaled steroids: Secondary | ICD-10-CM

## 2024-06-30 DIAGNOSIS — J441 Chronic obstructive pulmonary disease with (acute) exacerbation: Principal | ICD-10-CM | POA: Diagnosis present

## 2024-06-30 DIAGNOSIS — Z808 Family history of malignant neoplasm of other organs or systems: Secondary | ICD-10-CM

## 2024-06-30 DIAGNOSIS — J9611 Chronic respiratory failure with hypoxia: Secondary | ICD-10-CM | POA: Diagnosis present

## 2024-06-30 DIAGNOSIS — I10 Essential (primary) hypertension: Secondary | ICD-10-CM | POA: Diagnosis present

## 2024-06-30 DIAGNOSIS — F17210 Nicotine dependence, cigarettes, uncomplicated: Secondary | ICD-10-CM | POA: Diagnosis present

## 2024-06-30 DIAGNOSIS — F419 Anxiety disorder, unspecified: Secondary | ICD-10-CM | POA: Diagnosis present

## 2024-06-30 DIAGNOSIS — R55 Syncope and collapse: Secondary | ICD-10-CM

## 2024-06-30 DIAGNOSIS — I951 Orthostatic hypotension: Secondary | ICD-10-CM | POA: Diagnosis present

## 2024-06-30 DIAGNOSIS — G8929 Other chronic pain: Secondary | ICD-10-CM | POA: Diagnosis present

## 2024-06-30 DIAGNOSIS — Z79899 Other long term (current) drug therapy: Secondary | ICD-10-CM

## 2024-06-30 DIAGNOSIS — Z1152 Encounter for screening for COVID-19: Secondary | ICD-10-CM

## 2024-06-30 DIAGNOSIS — Z833 Family history of diabetes mellitus: Secondary | ICD-10-CM

## 2024-06-30 DIAGNOSIS — Z801 Family history of malignant neoplasm of trachea, bronchus and lung: Secondary | ICD-10-CM

## 2024-06-30 DIAGNOSIS — Z90710 Acquired absence of both cervix and uterus: Secondary | ICD-10-CM

## 2024-06-30 DIAGNOSIS — E785 Hyperlipidemia, unspecified: Secondary | ICD-10-CM | POA: Diagnosis present

## 2024-06-30 DIAGNOSIS — J432 Centrilobular emphysema: Secondary | ICD-10-CM

## 2024-06-30 DIAGNOSIS — G4733 Obstructive sleep apnea (adult) (pediatric): Secondary | ICD-10-CM | POA: Diagnosis present

## 2024-06-30 DIAGNOSIS — Z96642 Presence of left artificial hip joint: Secondary | ICD-10-CM | POA: Diagnosis present

## 2024-06-30 DIAGNOSIS — Z9989 Dependence on other enabling machines and devices: Secondary | ICD-10-CM

## 2024-06-30 LAB — PRO BRAIN NATRIURETIC PEPTIDE: Pro Brain Natriuretic Peptide: 175 pg/mL

## 2024-06-30 LAB — CBC WITH DIFFERENTIAL/PLATELET
Abs Immature Granulocytes: 0.05 10*3/uL (ref 0.00–0.07)
Basophils Absolute: 0 10*3/uL (ref 0.0–0.1)
Basophils Relative: 0 %
Eosinophils Absolute: 0 10*3/uL (ref 0.0–0.5)
Eosinophils Relative: 0 %
HCT: 36.5 % (ref 36.0–46.0)
Hemoglobin: 10.6 g/dL — ABNORMAL LOW (ref 12.0–15.0)
Immature Granulocytes: 1 %
Lymphocytes Relative: 10 %
Lymphs Abs: 0.6 10*3/uL — ABNORMAL LOW (ref 0.7–4.0)
MCH: 29.4 pg (ref 26.0–34.0)
MCHC: 29 g/dL — ABNORMAL LOW (ref 30.0–36.0)
MCV: 101.4 fL — ABNORMAL HIGH (ref 80.0–100.0)
Monocytes Absolute: 0.2 10*3/uL (ref 0.1–1.0)
Monocytes Relative: 3 %
Neutro Abs: 5.4 10*3/uL (ref 1.7–7.7)
Neutrophils Relative %: 86 %
Platelets: 232 10*3/uL (ref 150–400)
RBC: 3.6 MIL/uL — ABNORMAL LOW (ref 3.87–5.11)
RDW: 13.8 % (ref 11.5–15.5)
WBC: 6.3 10*3/uL (ref 4.0–10.5)
nRBC: 0 % (ref 0.0–0.2)

## 2024-06-30 LAB — BASIC METABOLIC PANEL WITH GFR
Anion gap: 5 (ref 5–15)
BUN: 19 mg/dL (ref 8–23)
CO2: 36 mmol/L — ABNORMAL HIGH (ref 22–32)
Calcium: 9.4 mg/dL (ref 8.9–10.3)
Chloride: 102 mmol/L (ref 98–111)
Creatinine, Ser: 0.65 mg/dL (ref 0.44–1.00)
GFR, Estimated: >60 mL/min
Glucose, Bld: 100 mg/dL — ABNORMAL HIGH (ref 70–99)
Potassium: 3.7 mmol/L (ref 3.5–5.1)
Sodium: 143 mmol/L (ref 135–145)

## 2024-06-30 LAB — RESP PANEL BY RT-PCR (RSV, FLU A&B, COVID)  RVPGX2
Influenza A by PCR: NEGATIVE
Influenza B by PCR: NEGATIVE
Resp Syncytial Virus by PCR: NEGATIVE
SARS Coronavirus 2 by RT PCR: NEGATIVE

## 2024-06-30 LAB — BLOOD GAS, VENOUS
Acid-Base Excess: 10.7 mmol/L — ABNORMAL HIGH (ref 0.0–2.0)
Bicarbonate: 38.4 mmol/L — ABNORMAL HIGH (ref 20.0–28.0)
O2 Saturation: 78.6 %
Patient temperature: 37
pCO2, Ven: 68 mmHg — ABNORMAL HIGH (ref 44–60)
pH, Ven: 7.36 (ref 7.25–7.43)
pO2, Ven: 47 mmHg — ABNORMAL HIGH (ref 32–45)

## 2024-06-30 LAB — TROPONIN T, HIGH SENSITIVITY
Troponin T High Sensitivity: 11 ng/L (ref 0–19)
Troponin T High Sensitivity: 10 ng/L (ref 0–19)

## 2024-06-30 MED ORDER — ENALAPRIL MALEATE 10 MG PO TABS
10.0000 mg | ORAL_TABLET | Freq: Every day | ORAL | Status: DC
  Administered 2024-07-01 – 2024-07-02 (×2): 10 mg via ORAL
  Filled 2024-06-30 (×3): qty 1

## 2024-06-30 MED ORDER — OXYCODONE-ACETAMINOPHEN 5-325 MG PO TABS
1.0000 | ORAL_TABLET | Freq: Four times a day (QID) | ORAL | Status: DC | PRN
  Administered 2024-06-30 – 2024-07-02 (×8): 1 via ORAL
  Filled 2024-06-30 (×8): qty 1

## 2024-06-30 MED ORDER — OXYCODONE-ACETAMINOPHEN 10-325 MG PO TABS: 1.0000 | ORAL_TABLET | Freq: Every day | ORAL | Status: DC | PRN

## 2024-06-30 MED ORDER — BENZONATATE 100 MG PO CAPS
100.0000 mg | ORAL_CAPSULE | Freq: Three times a day (TID) | ORAL | Status: DC | PRN
  Administered 2024-07-01: 100 mg via ORAL
  Filled 2024-06-30: qty 1

## 2024-06-30 MED ORDER — ACETAMINOPHEN 325 MG PO TABS: 650.0000 mg | ORAL_TABLET | Freq: Four times a day (QID) | ORAL | Status: DC | PRN

## 2024-06-30 MED ORDER — ZOLPIDEM TARTRATE 5 MG PO TABS
5.0000 mg | ORAL_TABLET | Freq: Every day | ORAL | Status: DC
  Administered 2024-06-30 – 2024-07-01 (×2): 5 mg via ORAL
  Filled 2024-06-30 (×2): qty 1

## 2024-06-30 MED ORDER — ATORVASTATIN CALCIUM 10 MG PO TABS
10.0000 mg | ORAL_TABLET | Freq: Every day | ORAL | Status: DC
  Administered 2024-07-01 – 2024-07-02 (×2): 10 mg via ORAL
  Filled 2024-06-30 (×2): qty 1

## 2024-06-30 MED ORDER — HYDROXYZINE HCL 25 MG PO TABS
25.0000 mg | ORAL_TABLET | Freq: Three times a day (TID) | ORAL | Status: DC | PRN
  Administered 2024-07-01: 25 mg via ORAL
  Filled 2024-06-30: qty 1

## 2024-06-30 MED ORDER — BUSPIRONE HCL 5 MG PO TABS: 7.5000 mg | ORAL_TABLET | Freq: Two times a day (BID) | ORAL | Status: DC

## 2024-06-30 MED ORDER — ONDANSETRON HCL 4 MG PO TABS: 4.0000 mg | ORAL_TABLET | Freq: Four times a day (QID) | ORAL | Status: DC | PRN

## 2024-06-30 MED ORDER — IPRATROPIUM-ALBUTEROL 0.5-2.5 (3) MG/3ML IN SOLN
3.0000 mL | Freq: Four times a day (QID) | RESPIRATORY_TRACT | Status: DC
  Administered 2024-06-30 (×3): 3 mL via RESPIRATORY_TRACT
  Filled 2024-06-30 (×3): qty 3

## 2024-06-30 MED ORDER — METHYLPREDNISOLONE SODIUM SUCC 125 MG IJ SOLR
125.0000 mg | Freq: Once | INTRAMUSCULAR | Status: AC
  Administered 2024-06-30: 125 mg via INTRAVENOUS
  Filled 2024-06-30: qty 2

## 2024-06-30 MED ORDER — METHYLPREDNISOLONE SODIUM SUCC 40 MG IJ SOLR
40.0000 mg | Freq: Two times a day (BID) | INTRAMUSCULAR | Status: DC
  Administered 2024-07-01 – 2024-07-02 (×3): 40 mg via INTRAVENOUS
  Filled 2024-06-30 (×3): qty 1

## 2024-06-30 MED ORDER — OXYCODONE HCL 5 MG PO TABS
5.0000 mg | ORAL_TABLET | Freq: Four times a day (QID) | ORAL | Status: DC | PRN
  Administered 2024-06-30 – 2024-07-02 (×8): 5 mg via ORAL
  Filled 2024-06-30 (×8): qty 1

## 2024-06-30 MED ORDER — ALBUTEROL SULFATE (2.5 MG/3ML) 0.083% IN NEBU: 2.5000 mg | INHALATION_SOLUTION | RESPIRATORY_TRACT | Status: DC | PRN

## 2024-06-30 MED ORDER — IPRATROPIUM-ALBUTEROL 0.5-2.5 (3) MG/3ML IN SOLN
3.0000 mL | RESPIRATORY_TRACT | Status: DC
  Administered 2024-06-30 – 2024-07-01 (×4): 3 mL via RESPIRATORY_TRACT
  Filled 2024-06-30 (×4): qty 3

## 2024-06-30 MED ORDER — MAGNESIUM SULFATE 2 GM/50ML IV SOLN
2.0000 g | Freq: Once | INTRAVENOUS | Status: AC
  Administered 2024-06-30: 2 g via INTRAVENOUS
  Filled 2024-06-30: qty 50

## 2024-06-30 MED ORDER — ACETAMINOPHEN 650 MG RE SUPP: 650.0000 mg | Freq: Four times a day (QID) | RECTAL | Status: DC | PRN

## 2024-06-30 MED ORDER — TRAZODONE HCL 50 MG PO TABS: 25.0000 mg | ORAL_TABLET | Freq: Every evening | ORAL | Status: DC | PRN

## 2024-06-30 MED ORDER — OXYCODONE-ACETAMINOPHEN 5-325 MG PO TABS
1.0000 | ORAL_TABLET | Freq: Once | ORAL | Status: AC
  Administered 2024-06-30: 1 via ORAL
  Filled 2024-06-30: qty 1

## 2024-06-30 MED ORDER — ENOXAPARIN SODIUM 40 MG/0.4ML IJ SOSY
40.0000 mg | PREFILLED_SYRINGE | INTRAMUSCULAR | Status: DC
  Administered 2024-06-30 – 2024-07-02 (×3): 40 mg via SUBCUTANEOUS
  Filled 2024-06-30 (×3): qty 0.4

## 2024-06-30 MED ORDER — MIRTAZAPINE 15 MG PO TABS
7.5000 mg | ORAL_TABLET | Freq: Every day | ORAL | Status: DC
  Administered 2024-06-30 – 2024-07-01 (×2): 7.5 mg via ORAL
  Filled 2024-06-30 (×2): qty 1

## 2024-06-30 MED ORDER — ONDANSETRON HCL 4 MG/2ML IJ SOLN: 4.0000 mg | Freq: Four times a day (QID) | INTRAMUSCULAR | Status: DC | PRN

## 2024-06-30 MED ORDER — GUAIFENESIN ER 600 MG PO TB12: 600.0000 mg | ORAL_TABLET | Freq: Two times a day (BID) | ORAL | Status: DC

## 2024-06-30 NOTE — Progress Notes (Signed)
" °   06/30/24 2339  BiPAP/CPAP/SIPAP  $ Non-Invasive Home Ventilator  Initial  $ Face Mask Medium Yes  BiPAP/CPAP/SIPAP Pt Type Adult  BiPAP/CPAP/SIPAP Resmed  Mask Type Full face mask  Dentures removed? Not applicable  Mask Size Medium  Respiratory Rate 20 breaths/min  Flow Rate 2 lpm (bled into circuit)  Patient Home Machine No  Patient Home Mask No  Patient Home Tubing No  Auto Titrate Yes  Minimum cmH2O 5 cmH2O  Maximum cmH2O 20 cmH2O  Device Plugged into RED Power Outlet Yes  BiPAP/CPAP /SiPAP Vitals  Resp 20  MEWS Score/Color  MEWS Score 0  MEWS Score Color Green    "

## 2024-06-30 NOTE — Progress Notes (Signed)
" °   06/30/24 1404  Vitals  Temp 98 F (36.7 C)  Temp Source Oral  BP 125/72  MAP (mmHg) 90  BP Location Right Arm  BP Method Automatic  Patient Position (if appropriate) Sitting  Pulse Rate 60  Pulse Rate Source Dinamap  Resp 20  Level of Consciousness  Level of Consciousness Alert  MEWS COLOR  MEWS Score Color Green  Orthostatic Lying   BP- Lying 125/72  Pulse- Lying 60  Orthostatic Sitting  BP- Sitting 120/74  Pulse- Sitting 72  Orthostatic Standing at 0 minutes  BP- Standing at 0 minutes 112/77  Pulse- Standing at 0 minutes 73  Orthostatic Standing at 3 minutes  BP- Standing at 3 minutes 118/75  Pulse- Standing at 3 minutes 68  Oxygen Therapy  SpO2 100 %  O2 Device Nasal Cannula  O2 Flow Rate (L/min) 3 L/min     Patient arrived to 1513 from ED. Orthostatic VS as above. Patient c/o a little dizziness with walking. Safety precations reviewed with patient. She denied any pain or shortness of breath. Lunch tray order. She requested for nicotine  patch although denied any hx of smoking. MD notified.  "

## 2024-06-30 NOTE — Progress Notes (Signed)
 Patient refused to have bed alarm activated and verbalized that she is not that old. RN reiterated the important of safety due to her c/o dizziness when she walk, patient still refused. Will continue nursing rounds per policy.

## 2024-06-30 NOTE — Plan of Care (Signed)
   Problem: Education: Goal: Knowledge of General Education information will improve Description Including pain rating scale, medication(s)/side effects and non-pharmacologic comfort measures Outcome: Progressing   Problem: Health Behavior/Discharge Planning: Goal: Ability to manage health-related needs will improve Outcome: Progressing

## 2024-07-01 LAB — BASIC METABOLIC PANEL WITH GFR
Anion gap: 4 — ABNORMAL LOW (ref 5–15)
BUN: 16 mg/dL (ref 8–23)
CO2: 36 mmol/L — ABNORMAL HIGH (ref 22–32)
Calcium: 9.1 mg/dL (ref 8.9–10.3)
Chloride: 103 mmol/L (ref 98–111)
Creatinine, Ser: 0.62 mg/dL (ref 0.44–1.00)
GFR, Estimated: >60 mL/min
Glucose, Bld: 88 mg/dL (ref 70–99)
Potassium: 4.1 mmol/L (ref 3.5–5.1)
Sodium: 143 mmol/L (ref 135–145)

## 2024-07-01 LAB — CBC
HCT: 32.5 % — ABNORMAL LOW (ref 36.0–46.0)
Hemoglobin: 9.8 g/dL — ABNORMAL LOW (ref 12.0–15.0)
MCH: 29.4 pg (ref 26.0–34.0)
MCHC: 30.2 g/dL (ref 30.0–36.0)
MCV: 97.6 fL (ref 80.0–100.0)
Platelets: 231 10*3/uL (ref 150–400)
RBC: 3.33 MIL/uL — ABNORMAL LOW (ref 3.87–5.11)
RDW: 13.8 % (ref 11.5–15.5)
WBC: 5.6 10*3/uL (ref 4.0–10.5)
nRBC: 0 % (ref 0.0–0.2)

## 2024-07-01 LAB — PROCALCITONIN: Procalcitonin: <0.1 ng/mL

## 2024-07-01 MED ORDER — ARFORMOTEROL TARTRATE 15 MCG/2ML IN NEBU
15.0000 ug | INHALATION_SOLUTION | Freq: Two times a day (BID) | RESPIRATORY_TRACT | Status: DC
  Administered 2024-07-01 – 2024-07-02 (×2): 15 ug via RESPIRATORY_TRACT
  Filled 2024-07-01 (×2): qty 2

## 2024-07-01 MED ORDER — REVEFENACIN 175 MCG/3ML IN SOLN
175.0000 ug | Freq: Every day | RESPIRATORY_TRACT | Status: DC
  Administered 2024-07-02: 175 ug via RESPIRATORY_TRACT
  Filled 2024-07-01: qty 3

## 2024-07-01 MED ORDER — HYDROXYZINE HCL 25 MG PO TABS
25.0000 mg | ORAL_TABLET | Freq: Three times a day (TID) | ORAL | Status: DC | PRN
  Administered 2024-07-01 – 2024-07-02 (×2): 25 mg via ORAL
  Filled 2024-07-01 (×2): qty 1

## 2024-07-01 MED ORDER — IPRATROPIUM-ALBUTEROL 0.5-2.5 (3) MG/3ML IN SOLN
3.0000 mL | Freq: Four times a day (QID) | RESPIRATORY_TRACT | Status: DC
  Administered 2024-07-01 – 2024-07-02 (×3): 3 mL via RESPIRATORY_TRACT
  Filled 2024-07-01 (×2): qty 3

## 2024-07-01 MED ORDER — POLYETHYLENE GLYCOL 3350 17 G PO PACK
17.0000 g | PACK | Freq: Two times a day (BID) | ORAL | Status: DC
  Administered 2024-07-01 – 2024-07-02 (×2): 17 g via ORAL
  Filled 2024-07-01 (×2): qty 1

## 2024-07-01 NOTE — TOC Initial Note (Addendum)
 Transition of Care [facility]) - Initial/Assessment Note    Patient Details  Name: [name] MRN: [id number] Date of Birth: 1955-12-08  [facility] CM/SW Contact:    Alfonse JONELLE Rex, RN Phone Number: 07/01/2024, 2:59 PM  Clinical Narrative:   Met with patient at bedside to introduce role of INPT CM and review for dc planning, PT recommendation for [facility] PT, pt prefers [facility]. Call to [place], rep w/[facility], reports unable to accept due to staffing. Patient notified, reviewed HH offers ( [facility], [facility], [facility], [facility]), patient had no preference, HH offer accepted for [facility], added to AVS.   Patient reports he resides alone, chronic home 02 with [facility], request sent to [facility], rep-Mitch, patient requesting travel tank, per [place], travel tank to be delivered to bedside. Patient reports she is independent with her self care, has a Rollator she uses as needed, reports she has family that lives in the [place] area. Patient inquiring about COPD programs. CM directed patient to her managed medicare plan to request a Case Manager to assist with community resources and referrals, pt voiced understanding. Patient reports she will need assistance with transportation at discharge.               Expected Discharge Plan: Home w Home Health Services Barriers to Discharge: Continued Medical Work up   Patient Goals and CMS Choice Patient states their goals for this hospitalization and ongoing recovery are:: return home CMS Medicare.gov Compare Post Acute Care list provided to:: Patient Choice offered to / list presented to : Patient [place] ownership interest in [facility].provided to:: Patient    Expected Discharge Plan and Services       Living arrangements for the past 2 months: Apartment                 DME Arranged: Oxygen DME Agency: [facility]       HH Arranged: PT HH Agency: [facility]        Prior Living  Arrangements/Services Living arrangements for the past 2 months: Apartment Lives with:: Self Patient language and need for interpreter reviewed:: Yes Do you feel safe going back to the place where you live?: Yes      Need for Family Participation in Patient Care: Yes (Comment) Care giver support system in place?: Yes (comment) Current home services: DME (Home 02) Criminal Activity/Legal Involvement Pertinent to Current Situation/Hospitalization: No - Comment as needed  Activities of Daily Living      Permission Sought/Granted                  Emotional Assessment Appearance:: Appears stated age Attitude/Demeanor/Rapport: Engaged Affect (typically observed): Accepting Orientation: : Oriented to Self, Oriented to Place, Oriented to  Time, Oriented to Situation Alcohol / Substance Use: Not Applicable Psych Involvement: No (comment)  Admission diagnosis:  COPD with acute exacerbation (HCC) [J44.1] Patient Active Problem List   Diagnosis Date Noted   HLD (hyperlipidemia) 03/28/2024   Chronic pain syndrome 03/28/2024   Chronic hypoxic respiratory failure (HCC) 12/02/2023   Continuous dependence on cigarette smoking 12/02/2023   Generalized anxiety disorder 12/02/2023   Acute exacerbation of chronic obstructive pulmonary disease (COPD) (HCC) 12/02/2023   Acute respiratory failure with hypoxia (HCC) 10/11/2023   Rectal bleeding 10/11/2023   Acute on chronic hypoxic respiratory failure (HCC) 09/06/2023   Wheezing 05/24/2023   COPD exacerbation (HCC) 05/22/2023   Precordial chest pain 03/31/2023   Centrilobular emphysema (HCC) 12/13/2022   Palliative  care by specialist 10/10/2022   Goals of care, counseling/discussion 10/10/2022   Vocal cord dysfunction 10/08/2022   Anxiety 10/08/2022   Protein-calorie malnutrition, severe 10/04/2022   COPD with acute exacerbation (HCC) 10/02/2022   Grief 06/03/2022   Pain of left upper extremity 12/28/2016   Elevated TSH 11/08/2016    Gastroesophageal reflux disease 11/08/2016   Constipation 11/08/2016   COPD (chronic obstructive pulmonary disease) (HCC) 11/08/2016   Tobacco use 11/08/2016   Non-cardiac chest pain 11/07/2016   Unstable angina (HCC) 01/07/2016   HTN (hypertension) 01/07/2016   Hypokalemia 11/06/2013   Acute blood loss anemia 11/06/2013   Osteoarthritis of left hip 11/05/2013   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 [place] [address] Phone: [phone] Fax: [phone]  [facility] [address] [place] [address] Phone: [phone] Fax: [phone]  [facility] - [place], [place] - [address] [place] [address] Phone: [phone] Fax: [phone]     Social Drivers of Health (SDOH) Social History: SDOH Screenings   Food Insecurity: No Food Insecurity (04/08/2024)  Housing: High Risk (04/08/2024)  Transportation Needs: No Transportation Needs (04/08/2024)  Utilities: Not At Risk (04/08/2024)  Social Connections: Socially Isolated (04/08/2024)  Tobacco Use: High Risk (06/30/2024)   SDOH Interventions:     Readmission Risk Interventions    04/09/2024   10:04 AM 12/04/2023    3:15 PM 10/04/2022    9:50 AM  Readmission Risk Prevention Plan  Post Dischage Appt   Complete  Medication Screening   Complete  Transportation Screening Complete Complete Complete  PCP or Specialist Appt within 3-5 Days  Complete   HRI or Home Care Consult  Complete   Social Work Consult for Recovery Care Planning/Counseling  Complete   Palliative Care Screening  Not  Applicable   Medication Review Oceanographer) Complete Complete   HRI or Home Care Consult Complete    SW Recovery Care/Counseling Consult Complete    Palliative Care Screening Not Applicable    Skilled Nursing Facility Not Applicable

## 2024-07-01 NOTE — Progress Notes (Signed)
 This writer was called into the room because patient wanted to make a complaint that her prescription eye glasses are missing. The glasses are black, with a African pattern. This clinical research associate searched the room with the patient and went through her belongings with her. Patient states, That someone was in her room and was going through her belongings and they stopped when she woke up and made noise. Patients room is tidy, clean and organized. Patient aware this writer is going to let management know about missing prescription eye glasses.

## 2024-07-01 NOTE — Progress Notes (Signed)
 Mobility Specialist - Progress Note  ([place] 3L) Pre-mobility: 79 bpm HR, 100% SpO2 During mobility: 100 bpm HR, 97% SpO2 Post-mobility: 93 bpm HR, 98% SPO2   07/01/24 1415  Mobility  Activity Ambulated with assistance  Level of Assistance Contact guard assist, steadying assist  Assistive Device Front wheel walker  Distance Ambulated (ft) 70 ft  Range of Motion/Exercises Active  Activity Response Tolerated well  Mobility visit 1 Mobility  Mobility Specialist Start Time (ACUTE ONLY) 1355  Mobility Specialist Stop Time (ACUTE ONLY) 1410  Mobility Specialist Time Calculation (min) (ACUTE ONLY) 15 min   Pt was found sitting EOB and agreeable to mobilize. No complaints with session. At EOS returned to sit EOB with all needs met. Call bell in reach.   [name],  Mobility Specialist Can be reached via Secure Chat

## 2024-07-01 NOTE — Progress Notes (Signed)
" °   07/01/24 2234  BiPAP/CPAP/SIPAP  BiPAP/CPAP/SIPAP Pt Type Adult  BiPAP/CPAP/SIPAP Resmed  Mask Type Full face mask  Dentures removed? Not applicable  Mask Size Medium  Respiratory Rate 20 breaths/min  Flow Rate 2 lpm (bled into circuit)  Patient Home Machine No  Patient Home Mask No  Patient Home Tubing No  Auto Titrate Yes  Minimum cmH2O 5 cmH2O  Maximum cmH2O 20 cmH2O  Device Plugged into RED Power Outlet Yes    "

## 2024-07-01 NOTE — Progress Notes (Signed)
 " PROGRESS NOTE    [name]  FMW:[id number] DOB: April 07, 1956 DOA: 06/30/2024 PCP: Patient, No Pcp Per  Chief Complaint  Patient presents with   Shortness of Breath   Chest Pain    Brief Narrative:   [name] is [name] 69 y.o. female with medical history significant for hypertension, COPD on chronic 2 to 3 L nasal cannula oxygen, chronic pain being admitted to the hospital with 1 week of dyspnea and cough likely due to AECOPD.   Assessment & Plan:   Principal Problem:   COPD with acute exacerbation (HCC)  Acute Exacerbation of COPD Chronic Hypoxic Respiratory Failure due to COPD on 2-3 L  1 week of cough, dyspnea without infectious symptoms.  Failed to improve with ER management on 1/29 and discharged home with prednisone  50 mg p.o. daily.  She notes progressive DOE over last 2-3 months or so. Scheduled and prn nebs IV steroids for now  Procal.  Holding abx for now   Syncope-patient relates finding herself on the ground [name] couple of days ago, denies any specific injury.  No prior history of syncope.   With reported DOE, question if this may have been in the setting of her COPD exacerbation with syncope related to hypoxia.  Other possibilities include hypotension/orthostasis (orthostatics negative 2/3).  EKG with normal Qtc, normal PR interval, sinus arrhythmia.  Will continue tele monitoring.  Echo 09/2023 without aortic stenosis, no RWMA.   Continue tele Follow therapy evals Follow symtpoms   Hyperlipidemia-Lipitor   Anxiety-BuSpar , Atarax  as needed   Hypertension-Vasotec    OSA-CPAP nightly   Chronic pain-followed by Dr. Jerrye at [facility] pain management -Continue Percocet 10, 5 times daily as needed    DVT prophylaxis: lovenox  Code Status: full Family Communication: none Disposition:   Status is: Inpatient Remains inpatient appropriate because: needs additional inpatient care   Consultants:  none  Procedures:  none  Antimicrobials:  Anti-infectives (From  admission, onward)    None       Subjective: C/o LH DOE   Objective: Vitals:   07/01/24 0459 07/01/24 0828 07/01/24 1355 07/01/24 1520  BP: 122/72  120/74   Pulse: 76  82   Resp: 18  19   Temp: 98.8 F (37.1 C)  98.8 F (37.1 C)   TempSrc: Oral     SpO2:  100% 100% 100%  Weight:      Height:        Intake/Output Summary (Last 24 hours) at 07/01/2024 1533 Last data filed at 07/01/2024 0900 Gross per 24 hour  Intake 960 ml  Output --  Net 960 ml   Filed Weights   06/30/24 1004  Weight: 49 kg    Examination:  General exam: thin, chronically ill appearing Respiratory system: diminished breath sounds Cardiovascular system: RRR, no mgr Central nervous system: Alert and oriented. No focal neurological deficits. Extremities: no LEE   Data Reviewed: I have personally reviewed following labs and imaging studies  CBC: Recent Labs  Lab 06/25/24 1433 06/30/24 1055 07/01/24 0605  WBC 5.1 6.3 5.6  NEUTROABS 3.0 5.4  --   HGB 10.9* 10.6* 9.8*  HCT 38.2 36.5 32.5*  MCV 101.6* 101.4* 97.6  PLT 269 232 231    Basic Metabolic Panel: Recent Labs  Lab 06/25/24 1433 06/30/24 1055 07/01/24 0605  NA 143 143 143  K 3.6 3.7 4.1  CL 103 102 103  CO2 34* 36* 36*  GLUCOSE 102* 100* 88  BUN 9 19 16   CREATININE 0.54  0.65 0.62  CALCIUM  8.9 9.4 9.1    GFR: Estimated Creatinine Clearance: 49.4 mL/min (by C-G formula based on SCr of 0.62 mg/dL).  Liver Function Tests: Recent Labs  Lab 06/25/24 1433  AST 22  ALT 11  ALKPHOS 45  BILITOT 0.4  PROT 5.9*  ALBUMIN 4.0    CBG: No results for input(s): GLUCAP in the last 168 hours.   Recent Results (from the past 240 hours)  Resp panel by RT-PCR (RSV, Flu Rai Regina, Covid) Anterior Nasal Swab     Status: None   Collection Time: 06/25/24  2:26 PM   Specimen: Anterior Nasal Swab  Result Value Ref Range Status   SARS Coronavirus 2 by RT PCR NEGATIVE NEGATIVE Final    Comment: (NOTE) SARS-CoV-2 target nucleic acids  are NOT DETECTED.  The SARS-CoV-2 RNA is generally detectable in upper respiratory specimens during the acute phase of infection. The lowest concentration of SARS-CoV-2 viral copies this assay can detect is 138 copies/mL. [name] negative result does not preclude SARS-Cov-2 infection and should not be used as the Arnold basis for treatment or other patient management decisions. [name] negative result may occur with  improper specimen collection/handling, submission of specimen other than nasopharyngeal swab, presence of viral mutation(s) within the areas targeted by this assay, and inadequate number of viral copies(<138 copies/mL). [name] negative result must be combined with clinical observations, patient history, and epidemiological information. The expected result is Negative.  Fact Sheet for Patients:  bloggercourse.com  Fact Sheet for Healthcare Providers:  seriousbroker.it  This test is no t yet approved or cleared by the [place]  FDA and  has been authorized for detection and/or diagnosis of SARS-CoV-2 by FDA under an Emergency Use Authorization (EUA). This EUA will remain  in effect (meaning this test can be used) for the duration of the COVID-19 declaration under Section 564(b)(1) of the Act, 21 U.S.C.section 360bbb-3(b)(1), unless the authorization is terminated  or revoked sooner.       Influenza [name] by PCR NEGATIVE NEGATIVE Final   Influenza B by PCR NEGATIVE NEGATIVE Final    Comment: (NOTE) The Xpert Xpress SARS-CoV-2/FLU/RSV plus assay is intended as an aid in the diagnosis of influenza from Nasopharyngeal swab specimens and should not be used as [name] basis for treatment. Nasal washings and aspirates are unacceptable for Xpert Xpress SARS-CoV-2/FLU/RSV testing.  Fact Sheet for Patients: bloggercourse.com  Fact Sheet for Healthcare Providers: seriousbroker.it  This test is  not yet approved or cleared by the [place]  FDA and has been authorized for detection and/or diagnosis of SARS-CoV-2 by FDA under an Emergency Use Authorization (EUA). This EUA will remain in effect (meaning this test can be used) for the duration of the COVID-19 declaration under Section 564(b)(1) of the Act, 21 U.S.C. section 360bbb-3(b)(1), unless the authorization is terminated or revoked.     Resp Syncytial Virus by PCR NEGATIVE NEGATIVE Final    Comment: (NOTE) Fact Sheet for Patients: bloggercourse.com  Fact Sheet for Healthcare Providers: seriousbroker.it  This test is not yet approved or cleared by the [place]  FDA and has been authorized for detection and/or diagnosis of SARS-CoV-2 by FDA under an Emergency Use Authorization (EUA). This EUA will remain in effect (meaning this test can be used) for the duration of the COVID-19 declaration under Section 564(b)(1) of the Act, 21 U.S.C. section 360bbb-3(b)(1), unless the authorization is terminated or revoked.  Performed at [facility], [address] [place], [place] [address]   Resp panel  by RT-PCR (RSV, Flu Lamis Behrmann&B, Covid) Anterior Nasal Swab     Status: None   Collection Time: 06/30/24 10:46 AM   Specimen: Anterior Nasal Swab  Result Value Ref Range Status   SARS Coronavirus 2 by RT PCR NEGATIVE NEGATIVE Final    Comment: (NOTE) SARS-CoV-2 target nucleic acids are NOT DETECTED.  The SARS-CoV-2 RNA is generally detectable in upper respiratory specimens during the acute phase of infection. The lowest concentration of SARS-CoV-2 viral copies this assay can detect is 138 copies/mL. Jayde Daffin negative result does not preclude SARS-Cov-2 infection and should not be used as the Arnold basis for treatment or other patient management decisions. Nelsie Domino negative result may occur with  improper specimen collection/handling, submission of specimen other than  nasopharyngeal swab, presence of viral mutation(s) within the areas targeted by this assay, and inadequate number of viral copies(<138 copies/mL). Sakinah Rosamond negative result must be combined with clinical observations, patient history, and epidemiological information. The expected result is Negative.  Fact Sheet for Patients:  bloggercourse.com  Fact Sheet for Healthcare Providers:  seriousbroker.it  This test is no t yet approved or cleared by the [place]  FDA and  has been authorized for detection and/or diagnosis of SARS-CoV-2 by FDA under an Emergency Use Authorization (EUA). This EUA will remain  in effect (meaning this test can be used) for the duration of the COVID-19 declaration under Section 564(b)(1) of the Act, 21 U.S.C.section 360bbb-3(b)(1), unless the authorization is terminated  or revoked sooner.       Influenza Manda Holstad by PCR NEGATIVE NEGATIVE Final   Influenza B by PCR NEGATIVE NEGATIVE Final    Comment: (NOTE) The Xpert Xpress SARS-CoV-2/FLU/RSV plus assay is intended as an aid in the diagnosis of influenza from Nasopharyngeal swab specimens and should not be used as Yareth Macdonnell Arnold basis for treatment. Nasal washings and aspirates are unacceptable for Xpert Xpress SARS-CoV-2/FLU/RSV testing.  Fact Sheet for Patients: bloggercourse.com  Fact Sheet for Healthcare Providers: seriousbroker.it  This test is not yet approved or cleared by the [place]  FDA and has been authorized for detection and/or diagnosis of SARS-CoV-2 by FDA under an Emergency Use Authorization (EUA). This EUA will remain in effect (meaning this test can be used) for the duration of the COVID-19 declaration under Section 564(b)(1) of the Act, 21 U.S.C. section 360bbb-3(b)(1), unless the authorization is terminated or revoked.     Resp Syncytial Virus by PCR NEGATIVE NEGATIVE Final    Comment:  (NOTE) Fact Sheet for Patients: bloggercourse.com  Fact Sheet for Healthcare Providers: seriousbroker.it  This test is not yet approved or cleared by the [place]  FDA and has been authorized for detection and/or diagnosis of SARS-CoV-2 by FDA under an Emergency Use Authorization (EUA). This EUA will remain in effect (meaning this test can be used) for the duration of the COVID-19 declaration under Section 564(b)(1) of the Act, 21 U.S.C. section 360bbb-3(b)(1), unless the authorization is terminated or revoked.  Performed at [facility], [address], [place], [place] [address]          Radiology Studies: CT Chest Wo Contrast Result Date: 06/30/2024 CLINICAL DATA:  Chest pain, shortness of breath. EXAM: CT CHEST WITHOUT CONTRAST TECHNIQUE: Multidetector CT imaging of the chest was performed following the standard protocol without IV contrast. RADIATION DOSE REDUCTION: This exam was performed according to the departmental dose-optimization program which includes automated exposure control, adjustment of the mA and/or kV according to patient size and/or use of iterative reconstruction technique. COMPARISON:  March 27, 2024. FINDINGS: Cardiovascular: No evidence of thoracic aortic aneurysm. Mild cardiomegaly. No pericardial effusion. Minimal coronary artery calcifications are noted. Mediastinum/Nodes: Evaluation of mediastinum is limited due to lack of intravenous contrast. Lungs/Pleura: No pneumothorax or pleural effusion is noted. Stable emphysematous disease is noted compared to prior exam. No acute pulmonary abnormality is noted. Upper Abdomen: No acute abnormality. Musculoskeletal: No chest wall mass or suspicious bone lesions identified. IMPRESSION: 1. Stable emphysematous disease. 2. Minimal coronary artery calcifications are noted. 3. No acute abnormality seen in the chest. Emphysema (ICD10-J43.9).  Electronically Signed   By: Lynwood Landy Raddle M.D.   On: 06/30/2024 11:45   DG Chest Port 1 View Result Date: 06/30/2024 CLINICAL DATA:  Shortness of breath.  Chest pain. EXAM: PORTABLE CHEST 1 VIEW COMPARISON:  06/25/2024 FINDINGS: Single AP radiograph. Numerous leads and wires project over the chest. Hyperinflation. Midline trachea. Patient rotated right. Normal heart size. No pleural effusion or pneumothorax. Skin fold over the superior right hemithorax. Increased density in the inferior left hemithorax along the left heart border and lateral left hemidiaphragm. IMPRESSION: Equivocal left lower lobe airspace disease. This could be artifact or pneumonia. If feasible,, recommend PA and lateral radiographs. Hyperinflation, consistent with the clinical history of COPD. Electronically Signed   By: Rockey Kilts M.D.   On: 06/30/2024 10:59        Scheduled Meds:  atorvastatin   10 mg Oral Daily   enalapril   10 mg Oral Daily   enoxaparin  (LOVENOX ) injection  40 mg Subcutaneous Q24H   ipratropium-albuterol   3 mL Nebulization Q4H   methylPREDNISolone  (SOLU-MEDROL ) injection  40 mg Intravenous Q12H   mirtazapine   7.5 mg Oral QHS   polyethylene glycol  17 g Oral BID   zolpidem   5 mg Oral QHS   Continuous Infusions:   LOS: 0 days    Time spent: over 30 min     Meliton Monte, MD [facility]   To contact the attending provider between 7A-7P or the covering provider during after hours 7P-7A, please log into the web site [email or web address] and access using universal [place] password for that web site. If you do not have the password, please call the hospital operator.  07/01/2024, 3:33 PM    "

## 2024-07-01 NOTE — Plan of Care (Signed)
" °  Problem: Education: Goal: Knowledge of General Education information will improve Description: Including pain rating scale, medication(s)/side effects and non-pharmacologic comfort measures Outcome: Progressing   Problem: Clinical Measurements: Goal: Will remain free from infection Outcome: Progressing Goal: Diagnostic test results will improve Outcome: Progressing Goal: Respiratory complications will improve Outcome: Progressing Goal: Cardiovascular complication will be avoided Outcome: Progressing   Problem: Nutrition: Goal: Adequate nutrition will be maintained Outcome: Progressing   Problem: Coping: Goal: Level of anxiety will decrease Outcome: Progressing   Problem: Elimination: Goal: Will not experience complications related to bowel motility Outcome: Progressing Goal: Will not experience complications related to urinary retention Outcome: Progressing   Problem: Pain Managment: Goal: General experience of comfort will improve and/or be controlled Outcome: Progressing   Problem: Safety: Goal: Ability to remain free from injury will improve Outcome: Progressing   "

## 2024-07-02 ENCOUNTER — Other Ambulatory Visit (HOSPITAL_COMMUNITY): Payer: Self-pay

## 2024-07-02 ENCOUNTER — Telehealth: Payer: Self-pay | Admitting: Pulmonary Disease

## 2024-07-02 LAB — COMPREHENSIVE METABOLIC PANEL WITH GFR
ALT: 9 U/L (ref 0–44)
AST: 17 U/L (ref 15–41)
Albumin: 4 g/dL (ref 3.5–5.0)
Alkaline Phosphatase: 47 U/L (ref 38–126)
Anion gap: 5 (ref 5–15)
BUN: 18 mg/dL (ref 8–23)
CO2: 38 mmol/L — ABNORMAL HIGH (ref 22–32)
Calcium: 9.8 mg/dL (ref 8.9–10.3)
Chloride: 100 mmol/L (ref 98–111)
Creatinine, Ser: 0.68 mg/dL (ref 0.44–1.00)
GFR, Estimated: >60 mL/min
Glucose, Bld: 140 mg/dL — ABNORMAL HIGH (ref 70–99)
Potassium: 4.4 mmol/L (ref 3.5–5.1)
Sodium: 143 mmol/L (ref 135–145)
Total Bilirubin: 0.3 mg/dL (ref 0.0–1.2)
Total Protein: 6 g/dL — ABNORMAL LOW (ref 6.5–8.1)

## 2024-07-02 LAB — CBC WITH DIFFERENTIAL/PLATELET
Abs Immature Granulocytes: 0.06 10*3/uL (ref 0.00–0.07)
Basophils Absolute: 0 10*3/uL (ref 0.0–0.1)
Basophils Relative: 0 %
Eosinophils Absolute: 0 10*3/uL (ref 0.0–0.5)
Eosinophils Relative: 0 %
HCT: 37.2 % (ref 36.0–46.0)
Hemoglobin: 10.8 g/dL — ABNORMAL LOW (ref 12.0–15.0)
Immature Granulocytes: 1 %
Lymphocytes Relative: 9 %
Lymphs Abs: 0.6 10*3/uL — ABNORMAL LOW (ref 0.7–4.0)
MCH: 29.3 pg (ref 26.0–34.0)
MCHC: 29 g/dL — ABNORMAL LOW (ref 30.0–36.0)
MCV: 100.8 fL — ABNORMAL HIGH (ref 80.0–100.0)
Monocytes Absolute: 0.3 10*3/uL (ref 0.1–1.0)
Monocytes Relative: 5 %
Neutro Abs: 5.9 10*3/uL (ref 1.7–7.7)
Neutrophils Relative %: 85 %
Platelets: 321 10*3/uL (ref 150–400)
RBC: 3.69 MIL/uL — ABNORMAL LOW (ref 3.87–5.11)
RDW: 13.8 % (ref 11.5–15.5)
WBC: 6.9 10*3/uL (ref 4.0–10.5)
nRBC: 0 % (ref 0.0–0.2)

## 2024-07-02 LAB — PHOSPHORUS: Phosphorus: 4 mg/dL (ref 2.5–4.6)

## 2024-07-02 LAB — MAGNESIUM: Magnesium: 2.1 mg/dL (ref 1.7–2.4)

## 2024-07-02 MED ORDER — MIRTAZAPINE 7.5 MG PO TABS
7.5000 mg | ORAL_TABLET | Freq: Every day | ORAL | 1 refills | Status: AC
  Filled 2024-07-02: qty 30, 30d supply, fill #0

## 2024-07-02 MED ORDER — PREDNISONE 20 MG PO TABS
ORAL_TABLET | ORAL | 0 refills | Status: AC
  Filled 2024-07-02: qty 10, 8d supply, fill #0

## 2024-07-02 MED ORDER — BISACODYL 10 MG RE SUPP
10.0000 mg | Freq: Every day | RECTAL | Status: DC | PRN
  Administered 2024-07-02: 10 mg via RECTAL
  Filled 2024-07-02: qty 1

## 2024-07-02 MED ORDER — ALUM & MAG HYDROXIDE-SIMETH 200-200-20 MG/5ML PO SUSP
30.0000 mL | Freq: Four times a day (QID) | ORAL | Status: DC | PRN
  Administered 2024-07-02: 30 mL via ORAL
  Filled 2024-07-02: qty 30

## 2024-07-02 MED ORDER — BUSPIRONE HCL 7.5 MG PO TABS
7.5000 mg | ORAL_TABLET | Freq: Two times a day (BID) | ORAL | 0 refills | Status: AC
  Filled 2024-07-02: qty 60, 30d supply, fill #0

## 2024-07-02 MED ORDER — HYDROXYZINE HCL 25 MG PO TABS
25.0000 mg | ORAL_TABLET | Freq: Three times a day (TID) | ORAL | 0 refills | Status: AC | PRN
  Filled 2024-07-02: qty 30, 10d supply, fill #0

## 2024-07-02 MED ORDER — ENALAPRIL-HYDROCHLOROTHIAZIDE 10-25 MG PO TABS
1.0000 | ORAL_TABLET | Freq: Every day | ORAL | 1 refills | Status: AC
  Filled 2024-07-02: qty 30, 30d supply, fill #0

## 2024-07-02 MED ORDER — ALBUTEROL SULFATE HFA 108 (90 BASE) MCG/ACT IN AERS
2.0000 | INHALATION_SPRAY | RESPIRATORY_TRACT | 1 refills | Status: AC | PRN
  Filled 2024-07-02: qty 6.7, 30d supply, fill #0

## 2024-07-02 NOTE — Telephone Encounter (Signed)
 Patient scheduled.

## 2024-07-02 NOTE — Progress Notes (Signed)
 Discharge medications delivered to patient at the bedside in a secure bag.

## 2024-07-02 NOTE — Telephone Encounter (Signed)
 Please schedule hospital follow up with me or an APP in 1-2 weeks for COPD exacerbation.  Thanks, JD

## 2024-07-02 NOTE — Discharge Summary (Signed)
 Physician Discharge Summary  [name] FMW:[id number] DOB: 1955-06-08 DOA: 06/30/2024  PCP: Patient, No Pcp Per  Admit date: 06/30/2024 Discharge date: 07/02/2024  Time spent: 40 minutes  Recommendations for Outpatient Follow-up:  Follow outpatient CBC/CMP  Follow with pulmonary outpatient Syncope - ? Related to COPD exacerbation/hypoxia - outpatient follow up with additional workup if recurrent    Discharge Diagnoses:  Principal Problem:   COPD with acute exacerbation [facility])   Discharge Condition: stable  Diet recommendation: heart healthy  Filed Weights   06/30/24 1004  Weight: 49 kg    History of present illness:   [name] is [name] 69 y.o. female with medical history significant for hypertension, COPD on chronic 2 to 3 L nasal cannula oxygen, chronic pain being admitted to the hospital with 1 week of dyspnea and cough likely due to AECOPD.   Stable for discharge on 2/5 after steroids and nebs and supportive care.   Hospital Course:  Assessment and Plan:  Acute Exacerbation of COPD Chronic Hypoxic Respiratory Failure due to COPD on 2-3 L  1 week of cough, dyspnea without infectious symptoms.  Failed to improve with ER management on 1/29 and discharged home with prednisone  50 mg p.o. daily.  She notes progressive DOE over last 2-3 months or so. Doing well today, no wheezing on exam Maintaining sats on home 3 L  Stable for discharge on home budesonide  and scheduled duonebs with steroid taper.  Needs pulmonary follow up.    Syncope-patient relates finding herself on the ground [name] couple of days ago, denies any specific injury.  No prior history of syncope.   With reported DOE, question if this may have been in the setting of her COPD exacerbation with syncope related to hypoxia.  Other possibilities include hypotension/orthostasis (orthostatics negative 2/3).  EKG with normal Qtc, normal PR interval, sinus arrhythmia.  Will continue tele monitoring.  Echo 09/2023 without  aortic stenosis, no RWMA.   Therapy recommending HH   Hyperlipidemia-Lipitor   Anxiety-BuSpar , Atarax  as needed   Hypertension-Vasotec    OSA-CPAP nightly   Chronic pain-followed by Dr. Jerrye at [facility] management -Continue Percocet 10, 5 times daily as needed     Procedures: none   Consultations: none  Discharge Exam: Vitals:   07/02/24 1144 07/02/24 1151  BP: (!) 142/97   Pulse: (!) 102   Resp: 16   Temp:    SpO2: 100% 96%   Eager to discharge today Comfortable with this plan  General: No acute distress. Cardiovascular: RRR Lungs: Clear to auscultation bilaterally - no wheezing appreciated - unlabored - seen walking without oxygen on way to nursing station without increased WOB or apparent SOB Abdomen: Soft, nontender, nondistended  Neurological: Alert and oriented 3. Moves all extremities 4 with equal strength. Cranial nerves II through XII grossly intact. Extremities: No clubbing or cyanosis. No edema. Discharge Instructions   Discharge Instructions     Call MD for:  difficulty breathing, headache or visual disturbances   Complete by: As directed    Call MD for:  extreme fatigue   Complete by: As directed    Call MD for:  hives   Complete by: As directed    Call MD for:  persistant dizziness or light-headedness   Complete by: As directed    Call MD for:  persistant nausea and vomiting   Complete by: As directed    Call MD for:  redness, tenderness, or signs of infection (pain, swelling, redness, odor or green/yellow discharge around  incision site)   Complete by: As directed    Call MD for:  severe uncontrolled pain   Complete by: As directed    Call MD for:  temperature >100.4   Complete by: As directed    Discharge instructions   Complete by: As directed    You were seen for [name] COPD exacerbation.  You've improved with steroids.  Continue your scheduled nebulizers as recommended by pulmonary.  Take budesonide  nebulizer treatments twice [name]  day.  Take duoneb nebulizer treatments four times [name] day.  Use albuterol  as needed for shortness of breath.  Your fainting (syncope) may have been related to low oxygen levels.  Follow up with your outpatient doctor.   Follow up with your pulmonologist as an outpatient.  Return for new, recurrent, or worsening symptoms.  Please ask your PCP to request records from this hospitalization so they know what was done and what the next steps will be.   Increase activity slowly   Complete by: As directed       Allergies as of 07/02/2024   No Known Allergies      Medication List     STOP taking these medications    enalapril  10 MG tablet Commonly known as: VASOTEC    furosemide  20 MG tablet Commonly known as: Lasix        TAKE these medications    albuterol  (2.5 MG/3ML) 0.083% nebulizer solution Commonly known as: PROVENTIL  Take 2.5 mg by nebulization every 6 (six) hours as needed for wheezing or shortness of breath.   albuterol  108 (90 Base) MCG/ACT inhaler Commonly known as: VENTOLIN  HFA Inhale 2 puffs into the lungs every 4 (four) hours as needed for wheezing or shortness of breath.   atorvastatin  10 MG tablet Commonly known as: LIPITOR Take 10 mg by mouth at bedtime.   benzonatate  100 MG capsule Commonly known as: TESSALON  Take 2 capsules (200 mg total) by mouth 3 (three) times daily as needed for cough.   budesonide  0.5 MG/2ML nebulizer solution Commonly known as: Pulmicort  Take 2 mLs (0.5 mg total) by nebulization 2 (two) times daily.   busPIRone  7.5 MG tablet Commonly known as: BUSPAR  Take 1 tablet (7.5 mg total) by mouth 2 (two) times daily.   enalapril -hydrochlorothiazide  10-25 MG tablet Commonly known as: VASERETIC  Take 1 tablet by mouth daily.   guaiFENesin  600 MG 12 hr tablet Commonly known as: MUCINEX  Take 1 tablet (600 mg total) by mouth 2 (two) times daily.   hydrOXYzine  25 MG tablet Commonly known as: ATARAX  Take 1 tablet (25 mg total) by mouth 3  (three) times daily as needed for anxiety.   ipratropium-albuterol  0.5-2.5 (3) MG/3ML Soln Commonly known as: DUONEB Take 3 mLs by nebulization every 6 (six) hours.   mirtazapine  7.5 MG tablet Commonly known as: REMERON  Take 1 tablet (7.5 mg total) by mouth at bedtime.   multivitamin capsule Take 1 capsule by mouth daily with breakfast.   Ohtuvayre  3 MG/2.5ML Susp Generic drug: Ensifentrine  Inhale one vial by nebulizer twice daily. Shake well before use. Do not mix with other nebulized medications.   oxyCODONE -acetaminophen  10-325 MG tablet Commonly known as: PERCOCET Take 1 tablet by mouth every 4 (four) hours as needed for severe pain (pain score 7-10) or pain.   OXYGEN Inhale 2-3 L/min into the lungs See admin instructions. 3 L in the daytime and 2 L at bedtime.   predniSONE  20 MG tablet Commonly known as: DELTASONE  Take 2 tablets (40 mg total) by mouth daily for 2  days, THEN 1.5 tablets (30 mg total) daily for 2 days, THEN 1 tablet (20 mg total) daily for 2 days, THEN 0.5 tablets (10 mg total) daily for 2 days. Start taking on: July 02, 2024 What changed:  medication strength See the new instructions.   PRESCRIPTION MEDICATION See admin instructions. CPAP- At bedtime   zolpidem  5 MG tablet Commonly known as: AMBIEN  Take 5 mg by mouth at bedtime.       Allergies[1]  Contact information for follow-up providers     [facility] Follow up.   Why: [facility]   [facility] Contact information: [address] [place] [address] [phone]              Contact information for after-discharge care     Home Medical Care     [facility]) .   Service: Home Health Services Why: Home Health Physical Therapy                      The results of significant diagnostics from this hospitalization (including imaging, microbiology, ancillary and laboratory) are listed below for reference.     Significant Diagnostic Studies: CT Chest Wo Contrast Result Date: 06/30/2024 CLINICAL DATA:  Chest pain, shortness of breath. EXAM: CT CHEST WITHOUT CONTRAST TECHNIQUE: Multidetector CT imaging of the chest was performed following the standard protocol without IV contrast. RADIATION DOSE REDUCTION: This exam was performed according to the departmental dose-optimization program which includes automated exposure control, adjustment of the mA and/or kV according to patient size and/or use of iterative reconstruction technique. COMPARISON:  March 27, 2024. FINDINGS: Cardiovascular: No evidence of thoracic aortic aneurysm. Mild cardiomegaly. No pericardial effusion. Minimal coronary artery calcifications are noted. Mediastinum/Nodes: Evaluation of mediastinum is limited due to lack of intravenous contrast. Lungs/Pleura: No pneumothorax or pleural effusion is noted. Stable emphysematous disease is noted compared to prior exam. No acute pulmonary abnormality is noted. Upper Abdomen: No acute abnormality. Musculoskeletal: No chest wall mass or suspicious bone lesions identified. IMPRESSION: 1. Stable emphysematous disease. 2. Minimal coronary artery calcifications are noted. 3. No acute abnormality seen in the chest. Emphysema (ICD10-J43.9). Electronically Signed   By: Lynwood Landy Raddle M.D.   On: 06/30/2024 11:45   DG Chest Port 1 View Result Date: 06/30/2024 CLINICAL DATA:  Shortness of breath.  Chest pain. EXAM: PORTABLE CHEST 1 VIEW COMPARISON:  06/25/2024 FINDINGS: Single AP radiograph. Numerous leads and wires project over the chest. Hyperinflation. Midline trachea. Patient rotated right. Normal heart size. No pleural effusion or pneumothorax. Skin fold over the superior right hemithorax. Increased density in the inferior left hemithorax along the left heart border and lateral left hemidiaphragm. IMPRESSION: Equivocal left lower lobe airspace disease. This could be artifact or pneumonia. If feasible,, recommend  PA and lateral radiographs. Hyperinflation, consistent with the clinical history of COPD. Electronically Signed   By: Rockey Kilts M.D.   On: 06/30/2024 10:59   DG Chest Port 1 View Result Date: 06/25/2024 CLINICAL DATA:  Shortness of breath. EXAM: PORTABLE CHEST 1 VIEW COMPARISON:  Chest radiograph dated 06/05/2024. FINDINGS: No focal consolidation, pleural effusion, pneumothorax. The cardiac silhouette is within normal limits. No acute osseous pathology. IMPRESSION: No active disease. Electronically Signed   By: Vanetta Chou M.D.   On: 06/25/2024 15:12   DG Chest Port 1 View Result Date: 06/05/2024 EXAM: 1 VIEW XRAY OF THE CHEST 06/05/2024 05:33:00 AM COMPARISON: 04/29/2024 CLINICAL HISTORY: 69 year old female with FINDINGS:  LUNGS AND PLEURA: Hazy left lingular opacity is chronic. No acute opacity. No pleural effusion. No pneumothorax. HEART AND MEDIASTINUM: Calcified aorta. No acute abnormality of the cardiac and mediastinal silhouettes. BONES AND SOFT TISSUES: Thoracic degenerative changes. No acute osseous abnormality. IMPRESSION: 1. Emphysema. No acute cardiopulmonary abnormality. Electronically signed by: Helayne Hurst MD MD 06/05/2024 05:41 AM EST RP Workstation: [id number]    Microbiology: Recent Results (from the past 240 hours)  Resp panel by RT-PCR (RSV, Flu Julianne Chamberlin&B, Covid) Anterior Nasal Swab     Status: None   Collection Time: 06/25/24  2:26 PM   Specimen: Anterior Nasal Swab  Result Value Ref Range Status   SARS Coronavirus 2 by RT PCR NEGATIVE NEGATIVE Final    Comment: (NOTE) SARS-CoV-2 target nucleic acids are NOT DETECTED.  The SARS-CoV-2 RNA is generally detectable in upper respiratory specimens during the acute phase of infection. The lowest concentration of SARS-CoV-2 viral copies this assay can detect is 138 copies/mL. Novi Calia negative result does not preclude SARS-Cov-2 infection and should not be used as the sole basis for treatment or other patient management decisions. Jayvien Rowlette  negative result may occur with  improper specimen collection/handling, submission of specimen other than nasopharyngeal swab, presence of viral mutation(s) within the areas targeted by this assay, and inadequate number of viral copies(<138 copies/mL). Maddalena Linarez negative result must be combined with clinical observations, patient history, and epidemiological information. The expected result is Negative.  Fact Sheet for Patients:  bloggercourse.com  Fact Sheet for Healthcare Providers:  seriousbroker.it  This test is no t yet approved or cleared by the [place]  FDA and  has been authorized for detection and/or diagnosis of SARS-CoV-2 by FDA under an Emergency Use Authorization (EUA). This EUA will remain  in effect (meaning this test can be used) for the duration of the COVID-19 declaration under Section 564(b)(1) of the Act, 21 U.S.C.section 360bbb-3(b)(1), unless the authorization is terminated  or revoked sooner.       Influenza Aylanie Cubillos by PCR NEGATIVE NEGATIVE Final   Influenza B by PCR NEGATIVE NEGATIVE Final    Comment: (NOTE) The Xpert Xpress SARS-CoV-2/FLU/RSV plus assay is intended as an aid in the diagnosis of influenza from Nasopharyngeal swab specimens and should not be used as Sanjeev Main sole basis for treatment. Nasal washings and aspirates are unacceptable for Xpert Xpress SARS-CoV-2/FLU/RSV testing.  Fact Sheet for Patients: bloggercourse.com  Fact Sheet for Healthcare Providers: seriousbroker.it  This test is not yet approved or cleared by the [place]  FDA and has been authorized for detection and/or diagnosis of SARS-CoV-2 by FDA under an Emergency Use Authorization (EUA). This EUA will remain in effect (meaning this test can be used) for the duration of the COVID-19 declaration under Section 564(b)(1) of the Act, 21 U.S.C. section 360bbb-3(b)(1), unless the authorization  is terminated or revoked.     Resp Syncytial Virus by PCR NEGATIVE NEGATIVE Final    Comment: (NOTE) Fact Sheet for Patients: bloggercourse.com  Fact Sheet for Healthcare Providers: seriousbroker.it  This test is not yet approved or cleared by the [place]  FDA and has been authorized for detection and/or diagnosis of SARS-CoV-2 by FDA under an Emergency Use Authorization (EUA). This EUA will remain in effect (meaning this test can be used) for the duration of the COVID-19 declaration under Section 564(b)(1) of the Act, 21 U.S.C. section 360bbb-3(b)(1), unless the authorization is terminated or revoked.  Performed at [facility], [address], [place], [place] [address]   Resp panel by RT-PCR (  RSV, Flu Gregori Abril&B, Covid) Anterior Nasal Swab     Status: None   Collection Time: 06/30/24 10:46 AM   Specimen: Anterior Nasal Swab  Result Value Ref Range Status   SARS Coronavirus 2 by RT PCR NEGATIVE NEGATIVE Final    Comment: (NOTE) SARS-CoV-2 target nucleic acids are NOT DETECTED.  The SARS-CoV-2 RNA is generally detectable in upper respiratory specimens during the acute phase of infection. The lowest concentration of SARS-CoV-2 viral copies this assay can detect is 138 copies/mL. Devontaye Ground negative result does not preclude SARS-Cov-2 infection and should not be used as the sole basis for treatment or other patient management decisions. Carena Stream negative result may occur with  improper specimen collection/handling, submission of specimen other than nasopharyngeal swab, presence of viral mutation(s) within the areas targeted by this assay, and inadequate number of viral copies(<138 copies/mL). Jocee Kissick negative result must be combined with clinical observations, patient history, and epidemiological information. The expected result is Negative.  Fact Sheet for Patients:  bloggercourse.com  Fact Sheet for  Healthcare Providers:  seriousbroker.it  This test is no t yet approved or cleared by the [place]  FDA and  has been authorized for detection and/or diagnosis of SARS-CoV-2 by FDA under an Emergency Use Authorization (EUA). This EUA will remain  in effect (meaning this test can be used) for the duration of the COVID-19 declaration under Section 564(b)(1) of the Act, 21 U.S.C.section 360bbb-3(b)(1), unless the authorization is terminated  or revoked sooner.       Influenza Mikael Skoda by PCR NEGATIVE NEGATIVE Final   Influenza B by PCR NEGATIVE NEGATIVE Final    Comment: (NOTE) The Xpert Xpress SARS-CoV-2/FLU/RSV plus assay is intended as an aid in the diagnosis of influenza from Nasopharyngeal swab specimens and should not be used as Kirsta Probert sole basis for treatment. Nasal washings and aspirates are unacceptable for Xpert Xpress SARS-CoV-2/FLU/RSV testing.  Fact Sheet for Patients: bloggercourse.com  Fact Sheet for Healthcare Providers: seriousbroker.it  This test is not yet approved or cleared by the [place]  FDA and has been authorized for detection and/or diagnosis of SARS-CoV-2 by FDA under an Emergency Use Authorization (EUA). This EUA will remain in effect (meaning this test can be used) for the duration of the COVID-19 declaration under Section 564(b)(1) of the Act, 21 U.S.C. section 360bbb-3(b)(1), unless the authorization is terminated or revoked.     Resp Syncytial Virus by PCR NEGATIVE NEGATIVE Final    Comment: (NOTE) Fact Sheet for Patients: bloggercourse.com  Fact Sheet for Healthcare Providers: seriousbroker.it  This test is not yet approved or cleared by the [place]  FDA and has been authorized for detection and/or diagnosis of SARS-CoV-2 by FDA under an Emergency Use Authorization (EUA). This EUA will remain in effect (meaning this  test can be used) for the duration of the COVID-19 declaration under Section 564(b)(1) of the Act, 21 U.S.C. section 360bbb-3(b)(1), unless the authorization is terminated or revoked.  Performed at [facility], [address], [place], [place] [address]      Labs: Basic Metabolic Panel: Recent Labs  Lab 06/25/24 1433 06/30/24 1055 07/01/24 0605 07/02/24 0548  NA 143 143 143 143  K 3.6 3.7 4.1 4.4  CL 103 102 103 100  CO2 34* 36* 36* 38*  GLUCOSE 102* 100* 88 140*  BUN 9 19 16 18   CREATININE 0.54 0.65 0.62 0.68  CALCIUM  8.9 9.4 9.1 9.8  MG  --   --   --  2.1  PHOS  --   --   --  4.0   Liver Function Tests: Recent Labs  Lab 06/25/24 1433 07/02/24 0548  AST 22 17  ALT 11 9  ALKPHOS 45 47  BILITOT 0.4 0.3  PROT 5.9* 6.0*  ALBUMIN 4.0 4.0   No results for input(s): LIPASE, AMYLASE in the last 168 hours. No results for input(s): AMMONIA in the last 168 hours. CBC: Recent Labs  Lab 06/25/24 1433 06/30/24 1055 07/01/24 0605 07/02/24 0548  WBC 5.1 6.3 5.6 6.9  NEUTROABS 3.0 5.4  --  5.9  HGB 10.9* 10.6* 9.8* 10.8*  HCT 38.2 36.5 32.5* 37.2  MCV 101.6* 101.4* 97.6 100.8*  PLT 269 232 231 321   Cardiac Enzymes: No results for input(s): CKTOTAL, CKMB, CKMBINDEX, TROPONINI in the last 168 hours. BNP: BNP (last 3 results) Recent Labs    12/02/23 2209 03/27/24 1342 04/07/24 1247  BNP 32.9 61.6 83.3    ProBNP (last 3 results) Recent Labs    04/29/24 1911 06/05/24 0515 06/30/24 1055  PROBNP 593.0* 179.0 175.0    CBG: No results for input(s): GLUCAP in the last 168 hours.     Signed:  Meliton Monte MD.  [facility] 07/02/2024, 1:19 PM       [1] No Known Allergies

## 2024-07-02 NOTE — TOC Transition Note (Addendum)
 Transition of Care [facility]) - Discharge Note   Patient Details  Name: ZABRIA LISS MRN: [id number] Date of Birth: Aug 09, 1955  Transition of Care [facility]) CM/SW Contact:  Sonda Manuella Quill, RN Phone Number: 07/02/2024, 2:15 PM   Clinical Narrative:    Notified pt needs assist w. transportation; spoke w/ pt in room; pt said she family cannot pick her up from hospital; pt also said she cannot afford pay for transportation; Therapist, Nutritional and Release of Liability signed and placed on shadow chart; pt's taxi voucher given to [employer], [employer]; she will call [employer] to set up transportation; pt also agreed to receive resources for transportation; pt verbalized understanding she will make her own appt w/ agency of choice; HHPT previously arranged w/ [facility]; Channing Ee at agency given notification of d/c; no IP CM needs.  Final next level of care: Home w Home Health Services Barriers to Discharge: No Barriers Identified   Patient Goals and CMS Choice Patient states their goals for this hospitalization and ongoing recovery are:: return home CMS Medicare.gov Compare Post Acute Care list provided to:: Patient Choice offered to / list presented to : Patient [place] ownership interest in [facility].provided to:: Patient    Discharge Placement                       Discharge Plan and Services Additional resources added to the After Visit Summary for                  DME Arranged: Oxygen DME Agency: [facility]       HH Arranged: PT HH Agency: [facility]        Social Drivers of Health (SDOH) Interventions SDOH Screenings   Food Insecurity: No Food Insecurity (07/01/2024)  Housing: High Risk (07/01/2024)  Transportation Needs: Unmet Transportation Needs (07/02/2024)  Utilities: Not At Risk (07/01/2024)  Social Connections: Moderately Isolated (07/01/2024)  Tobacco Use: High Risk (06/30/2024)     Readmission Risk Interventions    04/09/2024    10:04 AM 12/04/2023    3:15 PM 10/04/2022    9:50 AM  Readmission Risk Prevention Plan  Post Dischage Appt   Complete  Medication Screening   Complete  Transportation Screening Complete Complete Complete  PCP or Specialist Appt within 3-5 Days  Complete   HRI or Home Care Consult  Complete   Social Work Consult for Recovery Care Planning/Counseling  Complete   Palliative Care Screening  Not Applicable   Medication Review Oceanographer) Complete Complete   HRI or Home Care Consult Complete    SW Recovery Care/Counseling Consult Complete    Palliative Care Screening Not Applicable    Skilled Nursing Facility Not Applicable

## 2024-07-02 NOTE — Progress Notes (Signed)
 Entered patients room to change electrodes for telemetry monitor. Patient stated,  I want my medicine. This clinical research associate asked patient what medication specifically she was asking for. Patient stated, my pain medicine its time. Patient then proceeded to accuse this clinical research associate of coming  into her room to turn off the TV while she was sleeping. I explained to patient that the TV automatically shuts off to reset at 0200. Plan of care ongoing.

## 2024-07-02 NOTE — Progress Notes (Signed)
 Physical Therapy Treatment Patient Details Name: [name] MRN: [id number] DOB: 1955-06-06 Today's Date: 07/02/2024   History of Present Illness 69 yo female presents to therapy following hospital admission on 06/30/2024 due to acute exacerbation of COPD with SOB, cough fall/syncope and fatigue. Pt presented to ED on 1/29 and declined hospital admission and d/c home with prednisone . Pt PMH includes but is not limited to: tobacco abuse, anxiety, arthritis, asthma, angina, chronic pain/LBP, COPD on 2-3 L/min via [place] tube for supplemental O2 at baseline, HTN, SOB, cardiac cath, OSA on CPAP, lumbar surgery and L THA (2015).    PT Comments   The patient is ambulating independently in the room, on 3 L and at least 2 extra extensions. SPo2 100% when ambulated from BR. Patient ambulated 120' with RW. SPo2 100%. Recommend HHPT.    If plan is discharge home, recommend the following: Help with stairs or ramp for entrance;Assist for transportation;Assistance with cooking/housework   Can travel by private vehicle        Equipment Recommendations  None recommended by PT    Recommendations for Other Services       Precautions / Restrictions Precautions Precaution/Restrictions Comments: on 3 L Restrictions Weight Bearing Restrictions Per Provider Order: No     Mobility  Bed Mobility Overal bed mobility: Independent                  Transfers Overall transfer level: Independent                 General transfer comment: ambulating in the room with no device, crui    Ambulation/Gait Ambulation/Gait assistance: Independent, Supervision Gait Distance (Feet): 120 Feet Assistive device: Rolling walker (2 wheels) Gait Pattern/deviations: Step-through pattern Gait velocity: decreased     General Gait Details: slow speed   Stairs             Wheelchair Mobility     Tilt Bed    Modified Rankin (Stroke Patients Only)       Balance Overall balance assessment:  Mild deficits observed, not formally tested                                          Communication Communication Communication: No apparent difficulties  Cognition Arousal: Alert Behavior During Therapy: WFL for tasks assessed/performed   PT - Cognitive impairments: No apparent impairments                         Following commands: Intact      Cueing    Exercises      General Comments        Pertinent Vitals/Pain Pain Assessment Pain Assessment: No/denies pain    Home Living                          Prior Function            PT Goals (current goals can now be found in the care plan section) Progress towards PT goals: Progressing toward goals    Frequency    Min 3X/week      PT Plan      Co-evaluation              AM-PAC PT 6 Clicks Mobility   Outcome Measure  Help needed turning from your back to your side  while in a flat bed without using bedrails?: None Help needed moving from lying on your back to sitting on the side of a flat bed without using bedrails?: None Help needed moving to and from a bed to a chair (including a wheelchair)?: None Help needed standing up from a chair using your arms (e.g., wheelchair or bedside chair)?: None Help needed to walk in hospital room?: A Little Help needed climbing 3-5 steps with a railing? : A Lot 6 Click Score: 21    End of Session Equipment Utilized During Treatment: Oxygen Activity Tolerance: Patient tolerated treatment well Patient left: in bed;with call bell/phone within reach Nurse Communication: Mobility status PT Visit Diagnosis: Other abnormalities of gait and mobility (R26.89);Difficulty in walking, not elsewhere classified (R26.2);Pain     Time: 9154-9098 PT Time Calculation (min) (ACUTE ONLY): 16 min  Charges:    $Gait Training: 8-22 mins PT General Charges $$ ACUTE PT VISIT: 1 Visit                     Darice Potters PT Acute Rehabilitation  Services Office [phone]    Potters Darice Norris 07/02/2024, 1:18 PM

## 2024-07-14 ENCOUNTER — Inpatient Hospital Stay: Admitting: Primary Care
